# Patient Record
Sex: Female | Born: 1955 | Race: White | Hispanic: Yes | Smoking: Current every day smoker
Health system: Northeastern US, Community
[De-identification: ages and names within clinical notes are randomized; demographics above are authoritative.]

## PROBLEM LIST (undated history)

## (undated) DIAGNOSIS — K219 Gastro-esophageal reflux disease without esophagitis: Secondary | ICD-10-CM

## (undated) DIAGNOSIS — M19079 Primary osteoarthritis, unspecified ankle and foot: Secondary | ICD-10-CM

## (undated) DIAGNOSIS — K644 Residual hemorrhoidal skin tags: Secondary | ICD-10-CM

## (undated) DIAGNOSIS — H6120 Impacted cerumen, unspecified ear: Secondary | ICD-10-CM

## (undated) DIAGNOSIS — E119 Type 2 diabetes mellitus without complications: Secondary | ICD-10-CM

## (undated) DIAGNOSIS — J441 Chronic obstructive pulmonary disease with (acute) exacerbation: Secondary | ICD-10-CM

## (undated) DIAGNOSIS — F32A Depression, unspecified: Secondary | ICD-10-CM

## (undated) DIAGNOSIS — I10 Essential (primary) hypertension: Secondary | ICD-10-CM

## (undated) DIAGNOSIS — M1990 Unspecified osteoarthritis, unspecified site: Secondary | ICD-10-CM

## (undated) DIAGNOSIS — R51 Headache: Secondary | ICD-10-CM

## (undated) DIAGNOSIS — H6121 Impacted cerumen, right ear: Secondary | ICD-10-CM

## (undated) DIAGNOSIS — M25561 Pain in right knee: Secondary | ICD-10-CM

## (undated) DIAGNOSIS — R1013 Epigastric pain: Secondary | ICD-10-CM

## (undated) DIAGNOSIS — M549 Dorsalgia, unspecified: Secondary | ICD-10-CM

## (undated) DIAGNOSIS — J449 Chronic obstructive pulmonary disease, unspecified: Secondary | ICD-10-CM

## (undated) DIAGNOSIS — N764 Abscess of vulva: Secondary | ICD-10-CM

## (undated) DIAGNOSIS — M8580 Other specified disorders of bone density and structure, unspecified site: Secondary | ICD-10-CM

## (undated) DIAGNOSIS — R05 Cough: Secondary | ICD-10-CM

## (undated) DIAGNOSIS — R0902 Hypoxemia: Secondary | ICD-10-CM

## (undated) DIAGNOSIS — G4730 Sleep apnea, unspecified: Secondary | ICD-10-CM

## (undated) DIAGNOSIS — J069 Acute upper respiratory infection, unspecified: Secondary | ICD-10-CM

## (undated) DIAGNOSIS — K869 Disease of pancreas, unspecified: Secondary | ICD-10-CM

## (undated) DIAGNOSIS — M25562 Pain in left knee: Secondary | ICD-10-CM

## (undated) DIAGNOSIS — F329 Major depressive disorder, single episode, unspecified: Secondary | ICD-10-CM

## (undated) DIAGNOSIS — K2970 Gastritis, unspecified, without bleeding: Secondary | ICD-10-CM

## (undated) DIAGNOSIS — R079 Chest pain, unspecified: Secondary | ICD-10-CM

## (undated) DIAGNOSIS — J019 Acute sinusitis, unspecified: Secondary | ICD-10-CM

## (undated) DIAGNOSIS — B9689 Other specified bacterial agents as the cause of diseases classified elsewhere: Secondary | ICD-10-CM

## (undated) DIAGNOSIS — H9202 Otalgia, left ear: Secondary | ICD-10-CM

## (undated) DIAGNOSIS — Z973 Presence of spectacles and contact lenses: Secondary | ICD-10-CM

## (undated) HISTORY — DX: Impacted cerumen, right ear: H61.21

## (undated) HISTORY — PX: CATARACT EXTRACTION EXTRACAPSULAR W/ INTRAOCULAR LENS IMPLANTATION: SHX1305

## (undated) HISTORY — DX: Otalgia, left ear: H92.02

## (undated) HISTORY — PX: OB ANTEPARTUM CARE CESAREAN DLVR & POSTPARTUM: REP299

## (undated) HISTORY — DX: Hypoxemia: R09.02

## (undated) HISTORY — DX: Other specified disorders of bone density and structure, unspecified site: M85.80

## (undated) HISTORY — DX: Chest pain, unspecified: R07.9

## (undated) HISTORY — DX: Other specified bacterial agents as the cause of diseases classified elsewhere: B96.89

## (undated) HISTORY — PX: PR ANES NERVE MUSC TENDON FASCIA&BURSA KNEE&/POPLT: 01320

## (undated) HISTORY — DX: Headache: R51

## (undated) HISTORY — DX: Gastritis, unspecified, without bleeding: K29.70

## (undated) HISTORY — DX: Impacted cerumen, unspecified ear: H61.20

## (undated) HISTORY — DX: Cough: R05

## (undated) HISTORY — DX: Major depressive disorder, single episode, unspecified: F32.9

## (undated) HISTORY — DX: Pain in left knee: M25.561

## (undated) HISTORY — PX: CHOLECYSTECTOMY: GID484

## (undated) HISTORY — DX: Chronic obstructive pulmonary disease, unspecified: J44.9

## (undated) HISTORY — DX: Residual hemorrhoidal skin tags: K64.4

## (undated) HISTORY — PX: FOOT SURGERY: SHX648

## (undated) HISTORY — DX: Depression, unspecified: F32.A

## (undated) HISTORY — DX: Acute sinusitis, unspecified: J01.90

## (undated) HISTORY — DX: Acute upper respiratory infection, unspecified: J06.9

## (undated) HISTORY — DX: Pain in left knee: M25.562

## (undated) HISTORY — DX: Disease of pancreas, unspecified: K86.9

## (undated) HISTORY — DX: Epigastric pain: R10.13

## (undated) HISTORY — DX: Type 2 diabetes mellitus without complications: E11.9

## (undated) HISTORY — DX: Morbid (severe) obesity due to excess calories: E66.01

## (undated) HISTORY — DX: Abscess of vulva: N76.4

## (undated) HISTORY — DX: Gastro-esophageal reflux disease without esophagitis: K21.9

## (undated) HISTORY — PX: LAPAROSCOPY SURG CHOLECYSTECTOMY: GID642

## (undated) HISTORY — PX: TONSILLECTOMY & ADENOIDECTOMY <AGE 12: ENT168

## (undated) HISTORY — DX: Primary osteoarthritis, unspecified ankle and foot: M19.079

---
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
## 1998-11-30 ENCOUNTER — Ambulatory Visit (HOSPITAL_BASED_OUTPATIENT_CLINIC_OR_DEPARTMENT_OTHER): Payer: Self-pay | Admitting: Podiatrist

## 2000-06-16 ENCOUNTER — Emergency Department (HOSPITAL_BASED_OUTPATIENT_CLINIC_OR_DEPARTMENT_OTHER): Payer: Self-pay | Admitting: Emergency Medicine

## 2000-09-03 ENCOUNTER — Emergency Department (HOSPITAL_BASED_OUTPATIENT_CLINIC_OR_DEPARTMENT_OTHER): Payer: Self-pay | Admitting: Emergency Medicine

## 2000-09-12 ENCOUNTER — Ambulatory Visit (HOSPITAL_BASED_OUTPATIENT_CLINIC_OR_DEPARTMENT_OTHER): Payer: Self-pay | Admitting: Orthopaedic Surgery

## 2000-09-25 ENCOUNTER — Other Ambulatory Visit (HOSPITAL_BASED_OUTPATIENT_CLINIC_OR_DEPARTMENT_OTHER): Payer: Self-pay | Admitting: Orthopaedic Surgery

## 2000-10-04 ENCOUNTER — Ambulatory Visit (HOSPITAL_BASED_OUTPATIENT_CLINIC_OR_DEPARTMENT_OTHER): Payer: Self-pay | Admitting: Orthopaedic Surgery

## 2000-11-03 ENCOUNTER — Emergency Department (HOSPITAL_BASED_OUTPATIENT_CLINIC_OR_DEPARTMENT_OTHER): Payer: Self-pay | Admitting: Emergency Medicine

## 2000-11-20 ENCOUNTER — Ambulatory Visit (HOSPITAL_BASED_OUTPATIENT_CLINIC_OR_DEPARTMENT_OTHER): Payer: Self-pay | Admitting: Orthopaedic Surgery

## 2000-11-29 ENCOUNTER — Encounter (HOSPITAL_BASED_OUTPATIENT_CLINIC_OR_DEPARTMENT_OTHER): Payer: Self-pay | Admitting: Orthopaedic Surgery

## 2000-11-29 DIAGNOSIS — M23329 Other meniscus derangements, posterior horn of medial meniscus, unspecified knee: Secondary | ICD-10-CM

## 2000-11-29 LAB — ~~LOC~~-OPERATIVE REPORT

## 2000-12-11 ENCOUNTER — Ambulatory Visit (HOSPITAL_BASED_OUTPATIENT_CLINIC_OR_DEPARTMENT_OTHER): Payer: Self-pay | Admitting: Orthopaedic Surgery

## 2000-12-17 ENCOUNTER — Ambulatory Visit: Payer: Self-pay | Admitting: Orthopaedic Surgery

## 2001-01-14 ENCOUNTER — Ambulatory Visit: Payer: Self-pay | Admitting: Orthopaedic Surgery

## 2001-01-16 ENCOUNTER — Ambulatory Visit (HOSPITAL_BASED_OUTPATIENT_CLINIC_OR_DEPARTMENT_OTHER): Payer: Self-pay | Admitting: Orthopaedic Surgery

## 2001-02-04 ENCOUNTER — Ambulatory Visit: Payer: Self-pay | Admitting: Orthopaedic Surgery

## 2001-02-13 ENCOUNTER — Ambulatory Visit (HOSPITAL_BASED_OUTPATIENT_CLINIC_OR_DEPARTMENT_OTHER): Payer: Self-pay | Admitting: Orthopaedic Surgery

## 2001-02-22 ENCOUNTER — Emergency Department (HOSPITAL_BASED_OUTPATIENT_CLINIC_OR_DEPARTMENT_OTHER): Payer: Self-pay | Admitting: Neurology

## 2001-02-22 DIAGNOSIS — R071 Chest pain on breathing: Secondary | ICD-10-CM

## 2001-02-22 LAB — XR CHEST PORTABLE

## 2001-03-13 ENCOUNTER — Ambulatory Visit (HOSPITAL_BASED_OUTPATIENT_CLINIC_OR_DEPARTMENT_OTHER): Payer: Self-pay | Admitting: Orthopaedic Surgery

## 2001-04-16 ENCOUNTER — Ambulatory Visit (HOSPITAL_BASED_OUTPATIENT_CLINIC_OR_DEPARTMENT_OTHER): Payer: Self-pay | Admitting: Orthopaedic Surgery

## 2001-05-10 ENCOUNTER — Emergency Department (HOSPITAL_BASED_OUTPATIENT_CLINIC_OR_DEPARTMENT_OTHER): Payer: Self-pay | Admitting: Emergency Medicine

## 2001-05-17 ENCOUNTER — Emergency Department (HOSPITAL_BASED_OUTPATIENT_CLINIC_OR_DEPARTMENT_OTHER): Payer: Self-pay | Admitting: Emergency Medicine

## 2001-05-19 LAB — KNEE, LEFT 4 VIEWS

## 2001-06-05 ENCOUNTER — Ambulatory Visit (HOSPITAL_BASED_OUTPATIENT_CLINIC_OR_DEPARTMENT_OTHER): Payer: Self-pay | Admitting: Orthopaedic Surgery

## 2001-06-30 ENCOUNTER — Emergency Department (HOSPITAL_BASED_OUTPATIENT_CLINIC_OR_DEPARTMENT_OTHER): Payer: Self-pay | Admitting: Emergency Medicine

## 2001-06-30 DIAGNOSIS — Z9889 Other specified postprocedural states: Secondary | ICD-10-CM

## 2001-06-30 DIAGNOSIS — M79609 Pain in unspecified limb: Secondary | ICD-10-CM

## 2001-08-27 ENCOUNTER — Ambulatory Visit (HOSPITAL_BASED_OUTPATIENT_CLINIC_OR_DEPARTMENT_OTHER): Payer: Self-pay | Admitting: Orthopaedic Surgery

## 2001-09-26 ENCOUNTER — Emergency Department (HOSPITAL_BASED_OUTPATIENT_CLINIC_OR_DEPARTMENT_OTHER): Payer: Self-pay | Admitting: Internal Medicine

## 2001-09-26 DIAGNOSIS — R079 Chest pain, unspecified: Secondary | ICD-10-CM

## 2001-09-29 LAB — XR CHEST PORTABLE

## 2001-10-09 ENCOUNTER — Emergency Department: Payer: Self-pay | Admitting: Emergency Medicine

## 2001-10-09 DIAGNOSIS — M545 Low back pain, unspecified: Secondary | ICD-10-CM

## 2001-10-09 DIAGNOSIS — W19XXXA Unspecified fall, initial encounter: Secondary | ICD-10-CM

## 2001-11-27 ENCOUNTER — Ambulatory Visit (HOSPITAL_BASED_OUTPATIENT_CLINIC_OR_DEPARTMENT_OTHER): Payer: Self-pay | Admitting: Orthopaedic Surgery

## 2001-12-12 ENCOUNTER — Encounter (HOSPITAL_BASED_OUTPATIENT_CLINIC_OR_DEPARTMENT_OTHER): Payer: Self-pay | Admitting: Orthopaedic Surgery

## 2001-12-12 DIAGNOSIS — IMO0002 Reserved for concepts with insufficient information to code with codable children: Secondary | ICD-10-CM

## 2001-12-12 DIAGNOSIS — S83289A Other tear of lateral meniscus, current injury, unspecified knee, initial encounter: Secondary | ICD-10-CM

## 2001-12-12 LAB — ~~LOC~~-OPERATIVE REPORT

## 2001-12-19 ENCOUNTER — Ambulatory Visit (HOSPITAL_BASED_OUTPATIENT_CLINIC_OR_DEPARTMENT_OTHER): Payer: Self-pay | Admitting: Orthopaedic Surgery

## 2002-01-08 ENCOUNTER — Ambulatory Visit (HOSPITAL_BASED_OUTPATIENT_CLINIC_OR_DEPARTMENT_OTHER): Payer: Self-pay | Admitting: Orthopaedic Surgery

## 2002-01-12 ENCOUNTER — Emergency Department (HOSPITAL_BASED_OUTPATIENT_CLINIC_OR_DEPARTMENT_OTHER): Payer: Self-pay | Admitting: Internal Medicine

## 2002-02-04 ENCOUNTER — Ambulatory Visit (HOSPITAL_BASED_OUTPATIENT_CLINIC_OR_DEPARTMENT_OTHER): Payer: Self-pay | Admitting: Orthopaedic Surgery

## 2002-03-03 ENCOUNTER — Ambulatory Visit (HOSPITAL_BASED_OUTPATIENT_CLINIC_OR_DEPARTMENT_OTHER): Payer: Self-pay | Admitting: Orthopaedic Surgery

## 2002-04-22 ENCOUNTER — Ambulatory Visit (HOSPITAL_BASED_OUTPATIENT_CLINIC_OR_DEPARTMENT_OTHER): Payer: Self-pay | Admitting: Orthopaedic Surgery

## 2002-05-18 ENCOUNTER — Emergency Department (HOSPITAL_BASED_OUTPATIENT_CLINIC_OR_DEPARTMENT_OTHER): Payer: Self-pay | Admitting: Emergency Medicine

## 2002-05-18 DIAGNOSIS — G243 Spasmodic torticollis: Secondary | ICD-10-CM

## 2002-05-19 LAB — XR CERVICAL SPINE WITH OBLIQUES 5 VIEWS

## 2002-06-03 ENCOUNTER — Encounter (HOSPITAL_BASED_OUTPATIENT_CLINIC_OR_DEPARTMENT_OTHER): Payer: Self-pay | Admitting: Orthopaedic Surgery

## 2002-06-24 ENCOUNTER — Encounter (HOSPITAL_BASED_OUTPATIENT_CLINIC_OR_DEPARTMENT_OTHER): Payer: Self-pay | Admitting: Orthopaedic Surgery

## 2002-07-16 ENCOUNTER — Ambulatory Visit (HOSPITAL_BASED_OUTPATIENT_CLINIC_OR_DEPARTMENT_OTHER): Payer: Self-pay | Admitting: Orthopaedic Surgery

## 2002-07-28 ENCOUNTER — Emergency Department (HOSPITAL_BASED_OUTPATIENT_CLINIC_OR_DEPARTMENT_OTHER): Payer: Self-pay | Admitting: Emergency Medicine

## 2002-10-22 ENCOUNTER — Ambulatory Visit (HOSPITAL_BASED_OUTPATIENT_CLINIC_OR_DEPARTMENT_OTHER): Payer: Self-pay | Admitting: Orthopaedic Surgery

## 2002-10-23 LAB — XR KNEE RIGHT 3 VIEWS

## 2002-10-23 LAB — XR KNEE LEFT 3 VIEWS

## 2002-11-16 ENCOUNTER — Emergency Department (HOSPITAL_BASED_OUTPATIENT_CLINIC_OR_DEPARTMENT_OTHER): Payer: Self-pay | Admitting: Emergency Medicine

## 2002-12-10 ENCOUNTER — Emergency Department: Payer: Self-pay | Admitting: Internal Medicine

## 2002-12-26 LAB — TRANSFERASE ALANINE AMINO ALT SGPT
ALANINE AMINOTRANSFERASE: 20 U/L (ref 0–31)
ALANINE AMINOTRANSFERASE: 17 U/L (ref 0–31)

## 2002-12-26 LAB — COMPLETE BLOOD COUNT
HEMATOCRIT: 37.8 % (ref 37.0–47.0)
HEMOGLOBIN: 12.7 g/dL (ref 12.0–16.0)
MEAN CORP HGB CONC: 33.6 g/dL (ref 32.0–36.0)
MEAN CORPUSCULAR HGB: 28.7 pg (ref 27.0–31.0)
MEAN CORPUSCULAR VOL: 85.6 fL (ref 81.0–99.0)
RBC DISTRIBUTION WIDTH: 13.8 % (ref 11.5–14.3)
RED BLOOD CELL COUNT: 4.42 MIL/uL (ref 4.20–5.40)
WHITE BLOOD CELL COUNT: 9.7 10*3/uL (ref 4.8–10.8)

## 2002-12-26 LAB — PROTHROMBIN TIME
INR: 1 (ref 0.83–1.23)
INR: 1 (ref 0.83–1.23)
PROTHROMBIN TIME: 12.4 SECONDS (ref 10.5–13.0)
PROTHROMBIN TIME: 12.5 SECONDS (ref 10.5–13.0)

## 2002-12-26 LAB — CHOLESTEROL SERUM/WHOLE BLOOD TOTAL
Cholesterol: 197 mg/dl (ref ?–200)
Cholesterol: 183 mg/dl (ref ?–200)

## 2002-12-26 LAB — BASIC METABOLIC PANEL
BUN (UREA NITROGEN): 10 mg/dl (ref 6–20)
BUN (UREA NITROGEN): 10 mg/dl (ref 6–20)
CALCIUM: 8.9 mg/dl (ref 8.4–10.2)
CALCIUM: 9.3 mg/dl (ref 8.4–10.2)
CARBON DIOXIDE: 22 mEQ/L (ref 22–31)
CARBON DIOXIDE: 23 mEQ/L (ref 22–31)
CHLORIDE: 104 mEQ/L (ref 96–106)
CHLORIDE: 101 mEQ/L (ref 96–106)
CREATININE: 0.5 mg/dl (ref 0.4–1.2)
CREATININE: 0.5 mg/dl (ref 0.4–1.2)
Glucose Random: 128 mg/dl — ABNORMAL HIGH (ref 70–110)
Glucose Random: 93 mg/dl (ref 70–110)
POTASSIUM: 4 mEQ/L (ref 3.4–5.0)
POTASSIUM: 4 mEQ/L (ref 3.4–5.0)
SODIUM: 139 mEQ/L (ref 134–145)
SODIUM: 140 mEQ/L (ref 134–145)

## 2002-12-26 LAB — TROPONIN T, WHIDDEN HOSPITAL
TROPONIN T: <0.01 ng/ml (ref 0.00–0.04)
TROPONIN T: <0.01 ng/ml (ref 0.00–0.04)

## 2002-12-26 LAB — BLOOD COUNT COMPLETE AUTO&AUTO DIFRNTL WBC
BASOPHIL %: 0.8 % (ref 0–2)
BASOPHIL %: 0.8 % (ref 0–2)
EOSINOPHIL %: 3.4 % (ref 0–5)
EOSINOPHIL %: 2.1 % (ref 0–5)
HEMATOCRIT: 38 % (ref 34.9–46.9)
HEMATOCRIT: 40.1 % (ref 34.9–46.9)
HEMOGLOBIN: 12.9 g/dL (ref 11.7–15.7)
HEMOGLOBIN: 13.2 g/dL (ref 11.7–15.7)
LYMPHOCYTE %: 27.7 % (ref 20–45)
LYMPHOCYTE %: 26.1 % (ref 20–45)
MEAN CORP HGB CONC: 34 g/dL (ref 31.4–35.8)
MEAN CORP HGB CONC: 32.9 g/dL (ref 31.4–35.8)
MEAN CORPUSCULAR HGB: 30.7 pg (ref 26.4–34.0)
MEAN CORPUSCULAR HGB: 28.2 pg (ref 26.4–34.0)
MEAN CORPUSCULAR VOL: 90.4 fl (ref 80.5–99.7)
MEAN CORPUSCULAR VOL: 85.7 fl (ref 80.5–99.7)
MEAN PLATELET VOLUME: 10.8 fL (ref 6.8–12.8)
MEAN PLATELET VOLUME: 9.8 fL (ref 9.0–11.8)
MONOCYTE %: 5.5 % (ref 0–12)
MONOCYTE %: 6.9 % (ref 0–12)
NEUTROPHIL %: 62.7 % (ref 40–70)
NEUTROPHIL %: 64.3 % (ref 40–70)
PLATELET COUNT: 348 10*3/uL (ref 130–400)
PLATELET COUNT: 337 10*3/uL (ref 130–400)
RBC DISTRIBUTION WIDTH: 12.9 % (ref 11.0–15.0)
RBC DISTRIBUTION WIDTH: 12.9 % (ref 11.0–15.0)
RED BLOOD CELL COUNT: 4.2 M/uL (ref 3.80–5.20)
RED BLOOD CELL COUNT: 4.68 M/uL (ref 3.80–5.20)
WHITE BLOOD CELL COUNT: 10.8 10*3/uL (ref 4.5–11.0)
WHITE BLOOD CELL COUNT: 12.2 10*3/uL — ABNORMAL HIGH (ref 4.5–11.0)

## 2002-12-26 LAB — CK-MB PROFILE
CK INDEX: 1.2 % (ref 0–4)
CK INDEX: 1.8 % (ref 0–4)
CKMB: 1.3 ng/ml (ref 0.0–5.0)
CKMB: 1.7 ng/ml (ref 0.0–5.0)
CREATINE KINASE TOTAL: 105 IU/L (ref 24–170)
CREATINE KINASE TOTAL: 95 IU/L (ref 24–170)

## 2002-12-26 LAB — LACTATE DEHYDROGENASE
LACTATE DEHYDROGENASE: 168 U/L (ref 94–210)
LACTATE DEHYDROGENASE: 146 U/L (ref 94–210)

## 2002-12-26 LAB — TRANSFERASE ASPARTATE AMINO AST SGOT
ASPARTATE AMINOTRANSFERASE: 19 U/L (ref 0–31)
ASPARTATE AMINOTRANSFERASE: 13 U/L (ref 0–31)

## 2002-12-26 LAB — APTT
APTT: 26.1 SECONDS (ref 20.0–35.0)
APTT: 24.9 SECONDS (ref 20.0–35.0)

## 2003-01-21 ENCOUNTER — Ambulatory Visit (HOSPITAL_BASED_OUTPATIENT_CLINIC_OR_DEPARTMENT_OTHER): Payer: Self-pay | Admitting: Orthopaedic Surgery

## 2003-02-26 ENCOUNTER — Emergency Department (HOSPITAL_BASED_OUTPATIENT_CLINIC_OR_DEPARTMENT_OTHER): Payer: Self-pay | Admitting: Emergency Medicine

## 2003-02-27 LAB — SHOULDER, LEFT 3 VIEWS

## 2003-03-04 ENCOUNTER — Encounter (HOSPITAL_BASED_OUTPATIENT_CLINIC_OR_DEPARTMENT_OTHER): Payer: Self-pay | Admitting: Orthopaedic Surgery

## 2003-03-12 ENCOUNTER — Encounter (HOSPITAL_BASED_OUTPATIENT_CLINIC_OR_DEPARTMENT_OTHER): Payer: Self-pay

## 2003-03-19 ENCOUNTER — Encounter (HOSPITAL_BASED_OUTPATIENT_CLINIC_OR_DEPARTMENT_OTHER): Payer: Self-pay

## 2003-04-09 ENCOUNTER — Ambulatory Visit (HOSPITAL_BASED_OUTPATIENT_CLINIC_OR_DEPARTMENT_OTHER): Payer: Self-pay | Admitting: Orthopaedic Surgery

## 2003-06-07 ENCOUNTER — Emergency Department (HOSPITAL_BASED_OUTPATIENT_CLINIC_OR_DEPARTMENT_OTHER): Payer: Self-pay | Admitting: Emergency Medicine

## 2003-08-13 ENCOUNTER — Emergency Department (HOSPITAL_BASED_OUTPATIENT_CLINIC_OR_DEPARTMENT_OTHER): Payer: Self-pay | Admitting: Emergency Medicine

## 2003-08-16 LAB — LUMBAR SPINE, AP, LAT & SPOT

## 2003-08-21 ENCOUNTER — Emergency Department (HOSPITAL_BASED_OUTPATIENT_CLINIC_OR_DEPARTMENT_OTHER): Payer: Self-pay | Admitting: Emergency Medicine

## 2003-08-21 LAB — URINALYSIS
BACTERIA: <10 PER HPF — AB (ref 0–?)
BILIRUBIN, URINE: NEGATIVE
CASTS: NONE SEEN PER LPF
CRYSTALS: NONE SEEN
GLUCOSE, URINE: NEGATIVE MG/DL
LEUKOCYTE ESTERASE: NEGATIVE
NITRITE, URINE: NEGATIVE
PH URINE: 5 (ref 5.0–8.0)
PROTEIN, URINE: 100 MG/DL — AB
SPECIFIC GRAVITY URINE: >=1.03 (ref 1.003–1.035)

## 2003-08-21 LAB — CHG HEPATIC FUNCTION PANEL
ALANINE AMINOTRANSFERASE: 19 U/L (ref 0–31)
ALBUMIN: 4.3 g/dl (ref 3.2–4.9)
ALKALINE PHOSPHATASE: 88 U/L (ref 30–139)
ASPARTATE AMINOTRANSFERASE: 19 U/L (ref 0–31)
BILIRUBIN DIRECT: <0.1 mg/dl (ref 0–0.3)
BILIRUBIN TOTAL: 0.1 mg/dl — ABNORMAL LOW (ref 0.2–1.2)
TOTAL PROTEIN: 7.2 g/dl (ref 6.5–8.4)

## 2003-08-21 LAB — BLOOD COUNT COMPLETE AUTO&AUTO DIFRNTL WBC
BASOPHIL %: 0.3 % (ref 0–2)
EOSINOPHIL %: 0.2 % (ref 0–5)
HEMATOCRIT: 36.7 % (ref 34.9–46.9)
HEMOGLOBIN: 11.8 g/dL (ref 11.7–15.7)
LYMPHOCYTE %: 8.4 % — ABNORMAL LOW (ref 20–45)
MEAN CORP HGB CONC: 32.2 g/dL (ref 31.4–35.8)
MEAN CORPUSCULAR HGB: 27.2 pg (ref 26.4–34.0)
MEAN CORPUSCULAR VOL: 84.4 fl (ref 80.5–99.7)
MEAN PLATELET VOLUME: 10.1 fL (ref 6.8–12.8)
MONOCYTE %: 3.5 % (ref 0–12)
NEUTROPHIL %: 87.6 % — ABNORMAL HIGH (ref 40–70)
PLATELET COUNT: 350 10*3/uL (ref 130–400)
RBC DISTRIBUTION WIDTH: 15 % (ref 11.0–15.0)
RED BLOOD CELL COUNT: 4.34 M/uL (ref 3.80–5.20)
WHITE BLOOD CELL COUNT: 14.5 10*3/uL — ABNORMAL HIGH (ref 4.5–11.0)

## 2003-08-21 LAB — BASIC METABOLIC PANEL
BUN (UREA NITROGEN): 14 mg/dl (ref 6–20)
CALCIUM: 9.1 mg/dl (ref 8.4–10.2)
CARBON DIOXIDE: 22 mEQ/L (ref 22–31)
CHLORIDE: 105 mEQ/L (ref 96–106)
CREATININE: 0.6 mg/dl (ref 0.4–1.2)
Glucose Random: 119 mg/dl — ABNORMAL HIGH (ref 70–110)
POTASSIUM: 3.7 mEQ/L (ref 3.4–5.0)
SODIUM: 140 mEQ/L (ref 134–145)

## 2003-08-21 LAB — AMYLASE: AMYLASE: 44 U/L (ref 18–110)

## 2003-08-21 LAB — HCG QUALITATIVE SERUM: HCG QUALITATIVE SERUM: NEGATIVE

## 2003-08-23 LAB — URINE CULTURE/COLONY COUNT

## 2003-08-24 LAB — XR ABDOMEN (KUB) WITH UPRIGHT AND OR DECUBITUS

## 2003-10-24 ENCOUNTER — Emergency Department (HOSPITAL_BASED_OUTPATIENT_CLINIC_OR_DEPARTMENT_OTHER): Payer: Self-pay | Admitting: Emergency Medicine

## 2003-12-10 ENCOUNTER — Encounter (HOSPITAL_BASED_OUTPATIENT_CLINIC_OR_DEPARTMENT_OTHER): Payer: MEDICARE

## 2003-12-10 DIAGNOSIS — IMO0002 Reserved for concepts with insufficient information to code with codable children: Secondary | ICD-10-CM

## 2003-12-10 DIAGNOSIS — M1710 Unilateral primary osteoarthritis, unspecified knee: Principal | ICD-10-CM

## 2003-12-10 DIAGNOSIS — M25569 Pain in unspecified knee: Secondary | ICD-10-CM

## 2003-12-13 LAB — KNEE, LEFT 2 VIEWS

## 2003-12-13 LAB — KNEES, BILAT AP STANDING ONLY

## 2003-12-13 LAB — KNEE, RIGHT 2 VIEWS

## 2004-01-01 ENCOUNTER — Emergency Department (HOSPITAL_BASED_OUTPATIENT_CLINIC_OR_DEPARTMENT_OTHER): Payer: Self-pay | Admitting: Emergency Medicine

## 2004-01-03 LAB — XR KNEE LEFT 3 VIEWS

## 2004-06-05 ENCOUNTER — Ambulatory Visit (HOSPITAL_BASED_OUTPATIENT_CLINIC_OR_DEPARTMENT_OTHER): Payer: Self-pay | Admitting: Orthopaedic Surgery

## 2004-08-10 ENCOUNTER — Ambulatory Visit (HOSPITAL_BASED_OUTPATIENT_CLINIC_OR_DEPARTMENT_OTHER): Payer: MEDICARE | Admitting: Orthopaedic Surgery

## 2004-09-19 ENCOUNTER — Encounter (HOSPITAL_BASED_OUTPATIENT_CLINIC_OR_DEPARTMENT_OTHER): Payer: Self-pay | Admitting: Orthopaedic Surgery

## 2004-09-21 ENCOUNTER — Encounter (HOSPITAL_BASED_OUTPATIENT_CLINIC_OR_DEPARTMENT_OTHER): Payer: MEDICARE | Admitting: Physician Assistant

## 2004-10-19 ENCOUNTER — Encounter (HOSPITAL_BASED_OUTPATIENT_CLINIC_OR_DEPARTMENT_OTHER): Payer: MEDICARE | Admitting: Orthopaedic Surgery

## 2004-11-14 LAB — ~~LOC~~-OPERATIVE REPORT

## 2004-12-08 ENCOUNTER — Emergency Department: Payer: Self-pay | Admitting: Emergency Medicine

## 2004-12-20 ENCOUNTER — Ambulatory Visit (HOSPITAL_BASED_OUTPATIENT_CLINIC_OR_DEPARTMENT_OTHER): Payer: MEDICARE | Admitting: Physician Assistant

## 2005-01-03 ENCOUNTER — Ambulatory Visit (HOSPITAL_BASED_OUTPATIENT_CLINIC_OR_DEPARTMENT_OTHER): Payer: MEDICARE | Admitting: Physician Assistant

## 2005-01-05 LAB — ORTHOPEDIC OFFICE NOTE

## 2005-01-09 ENCOUNTER — Emergency Department: Payer: Self-pay | Admitting: Emergency Medicine

## 2005-02-22 ENCOUNTER — Ambulatory Visit (HOSPITAL_BASED_OUTPATIENT_CLINIC_OR_DEPARTMENT_OTHER): Payer: MEDICARE | Admitting: Orthopaedic Surgery

## 2005-02-22 DIAGNOSIS — M79609 Pain in unspecified limb: Secondary | ICD-10-CM

## 2005-02-22 DIAGNOSIS — M7120 Synovial cyst of popliteal space [Baker], unspecified knee: Secondary | ICD-10-CM

## 2005-02-22 DIAGNOSIS — M6580 Other synovitis and tenosynovitis, unspecified site: Secondary | ICD-10-CM

## 2005-02-22 LAB — VENOUS DUPLEX DOPPLER

## 2005-05-10 ENCOUNTER — Ambulatory Visit (HOSPITAL_BASED_OUTPATIENT_CLINIC_OR_DEPARTMENT_OTHER): Payer: MEDICARE | Admitting: Orthopaedic Surgery

## 2005-06-08 ENCOUNTER — Emergency Department (HOSPITAL_BASED_OUTPATIENT_CLINIC_OR_DEPARTMENT_OTHER): Payer: Self-pay | Admitting: Emergency Medicine

## 2005-10-31 ENCOUNTER — Ambulatory Visit (HOSPITAL_BASED_OUTPATIENT_CLINIC_OR_DEPARTMENT_OTHER): Payer: MEDICARE

## 2005-11-07 ENCOUNTER — Ambulatory Visit (HOSPITAL_BASED_OUTPATIENT_CLINIC_OR_DEPARTMENT_OTHER): Payer: MEDICARE

## 2005-11-07 DIAGNOSIS — M2408 Loose body, other site: Secondary | ICD-10-CM

## 2005-11-07 LAB — KNEE, LEFT 4 VIEWS

## 2005-11-07 LAB — XR KNEES STANDING AP ONLY BILATERAL

## 2005-11-16 LAB — ORTHOPEDIC OFFICE NOTE

## 2005-11-22 ENCOUNTER — Other Ambulatory Visit: Payer: Self-pay | Admitting: Orthopaedic Surgery

## 2005-11-22 DIAGNOSIS — M25569 Pain in unspecified knee: Secondary | ICD-10-CM

## 2005-11-22 DIAGNOSIS — M1710 Unilateral primary osteoarthritis, unspecified knee: Secondary | ICD-10-CM

## 2005-11-22 LAB — MRI KNEE LEFT WO CONTRAST

## 2005-11-26 ENCOUNTER — Ambulatory Visit (HOSPITAL_BASED_OUTPATIENT_CLINIC_OR_DEPARTMENT_OTHER): Payer: MEDICARE | Admitting: Orthopaedic Surgery

## 2005-11-26 DIAGNOSIS — M25569 Pain in unspecified knee: Secondary | ICD-10-CM

## 2005-12-02 LAB — ORTHOPEDIC OFFICE NOTE

## 2005-12-14 ENCOUNTER — Encounter (HOSPITAL_BASED_OUTPATIENT_CLINIC_OR_DEPARTMENT_OTHER): Payer: Self-pay | Admitting: Orthopaedic Surgery

## 2005-12-18 ENCOUNTER — Ambulatory Visit (HOSPITAL_BASED_OUTPATIENT_CLINIC_OR_DEPARTMENT_OTHER): Payer: MEDICARE

## 2005-12-24 LAB — BLOOD COUNT COMPLETE AUTOMATED
HEMATOCRIT: 32.6 % — ABNORMAL LOW (ref 37.0–47.0)
HEMOGLOBIN: 10.8 g/dL — ABNORMAL LOW (ref 12.0–16.0)
MEAN CORP HGB CONC: 33.2 g/dL (ref 32.0–36.0)
MEAN CORPUSCULAR HGB: 26.6 pg — ABNORMAL LOW (ref 27.0–31.0)
MEAN CORPUSCULAR VOL: 80.1 fL — ABNORMAL LOW (ref 81.0–99.0)
MEAN PLATELET VOLUME: 8.4 fL (ref 6.4–10.8)
PLATELET COUNT: 406 10*3/uL — ABNORMAL HIGH (ref 150–400)
RBC DISTRIBUTION WIDTH: 17 % — ABNORMAL HIGH (ref 11.5–14.3)
RED BLOOD CELL COUNT: 4.07 MIL/uL — ABNORMAL LOW (ref 4.20–5.40)
WHITE BLOOD CELL COUNT: 12.3 10*3/uL — ABNORMAL HIGH (ref 4.8–10.8)

## 2005-12-24 LAB — EKG-12 ** TO BE DONE BY EKG **

## 2005-12-24 LAB — APTT: APTT: 25.5 SECONDS (ref 22.0–32.8)

## 2005-12-24 LAB — BASIC METABOLIC PANEL
BUN (UREA NITROGEN): 10 mg/dl (ref 6–20)
CALCIUM: 9.1 mg/dl (ref 8.6–10.0)
CARBON DIOXIDE: 24 mmol/L (ref 22–32)
CHLORIDE: 106 mmol/L (ref 98–107)
CREATININE: 0.7 mg/dl (ref 0.4–1.2)
Glucose Random: 110 mg/dl (ref 74–160)
POTASSIUM: 4.2 mmol/L (ref 3.5–5.1)
SODIUM: 135 mmol/L (ref 135–144)

## 2005-12-24 LAB — PROTHROMBIN TIME
INR: 0.9 — ABNORMAL LOW (ref 2.0–3.5)
PROTHROMBIN TIME: 11.6 SECONDS (ref 11.5–13.5)

## 2005-12-31 ENCOUNTER — Ambulatory Visit (HOSPITAL_BASED_OUTPATIENT_CLINIC_OR_DEPARTMENT_OTHER): Payer: MEDICARE

## 2005-12-31 DIAGNOSIS — M2240 Chondromalacia patellae, unspecified knee: Secondary | ICD-10-CM

## 2006-01-18 LAB — ORTHOPEDIC OFFICE NOTE

## 2006-01-21 LAB — ~~LOC~~-OPERATIVE REPORT

## 2006-01-29 ENCOUNTER — Ambulatory Visit (HOSPITAL_BASED_OUTPATIENT_CLINIC_OR_DEPARTMENT_OTHER): Payer: MEDICARE

## 2006-06-09 ENCOUNTER — Emergency Department (HOSPITAL_BASED_OUTPATIENT_CLINIC_OR_DEPARTMENT_OTHER): Payer: Self-pay | Admitting: Emergency Medicine

## 2006-07-21 ENCOUNTER — Emergency Department (HOSPITAL_BASED_OUTPATIENT_CLINIC_OR_DEPARTMENT_OTHER): Payer: Self-pay | Admitting: Emergency Medicine

## 2006-07-21 LAB — BASIC METABOLIC PANEL
ANION GAP: 10 mmol/L (ref 2–25)
BUN (UREA NITROGEN): 11 mg/dl (ref 6–20)
CALCIUM: 9.2 mg/dl (ref 8.6–10.0)
CARBON DIOXIDE: 23 mmol/L (ref 22–32)
CHLORIDE: 103 mmol/L (ref 101–111)
CREATININE: 0.7 mg/dl (ref 0.4–1.2)
Glucose Random: 132 mg/dl (ref 74–160)
POTASSIUM: 3.9 mmol/L (ref 3.5–5.1)
SODIUM: 136 mmol/L (ref 135–144)

## 2006-07-21 LAB — BLOOD COUNT COMPLETE AUTO&AUTO DIFRNTL WBC
BASOPHIL %: 2.3 % — ABNORMAL HIGH (ref 0–2)
EOSINOPHIL %: 2.8 % (ref 0–5)
HEMATOCRIT: 37 % (ref 34.9–46.9)
HEMOGLOBIN: 12.4 g/dL (ref 11.7–15.7)
LYMPHOCYTE %: 27.5 % (ref 20–45)
MEAN CORP HGB CONC: 33.5 g/dL (ref 31.4–35.8)
MEAN CORPUSCULAR HGB: 28.4 pg (ref 26.4–34.0)
MEAN CORPUSCULAR VOL: 84.8 fl (ref 80.5–99.7)
MEAN PLATELET VOLUME: 8.4 fL (ref 6.8–12.8)
MONOCYTE %: 6.2 % (ref 0–12)
NEUTROPHIL %: 61.2 % (ref 40–70)
PLATELET COUNT: 310 10*3/uL (ref 130–400)
RBC DISTRIBUTION WIDTH: 19.1 % — ABNORMAL HIGH (ref 11.0–15.0)
RED BLOOD CELL COUNT: 4.37 M/uL (ref 3.80–5.20)
WHITE BLOOD CELL COUNT: 11.2 10*3/uL — ABNORMAL HIGH (ref 4.5–11.0)

## 2006-07-21 LAB — PROTHROMBIN TIME
INR: 0.9 — ABNORMAL LOW (ref 2.0–3.5)
PROTHROMBIN TIME: 11.9 SECONDS (ref 11.5–13.5)

## 2006-07-21 LAB — TROPONIN I
TROPONIN I: 0.01 ng/mL (ref 0.00–0.04)
TROPONIN I: 0.01 ng/mL (ref 0.00–0.04)

## 2006-07-21 LAB — LACTATE DEHYDROGENASE: LACTATE DEHYDROGENASE: 129 IU/L (ref 100–242)

## 2006-07-21 LAB — XR CHEST PORTABLE

## 2006-07-21 LAB — CK-MB PROFILE
CK INDEX: 1.7 %
CKMB: 1.5 ng/mL (ref 0.0–4.0)
CREATINE KINASE TOTAL: 90 IU/L (ref 21–215)

## 2006-07-21 LAB — EKG

## 2006-07-21 LAB — EMERGENCY ROOM NOTE

## 2006-07-21 LAB — CHOLESTEROL SERUM/WHOLE BLOOD TOTAL: Cholesterol: 111 mg/dl (ref 0–200)

## 2006-07-21 LAB — TRANSFERASE ALANINE AMINO ALT SGPT: ALANINE AMINOTRANSFERASE: 20 IU/L (ref 7–35)

## 2006-07-21 LAB — TRANSFERASE ASPARTATE AMINO AST SGOT: ASPARTATE AMINOTRANSFERASE: 19 IU/L (ref 8–34)

## 2006-07-21 LAB — APTT: APTT: 24.8 SECONDS (ref 22.0–32.8)

## 2006-08-08 LAB — EMERGENCY ROOM NOTE

## 2006-09-14 LAB — EMERGENCY ROOM NOTE

## 2006-11-24 ENCOUNTER — Emergency Department (HOSPITAL_BASED_OUTPATIENT_CLINIC_OR_DEPARTMENT_OTHER): Payer: Self-pay | Admitting: Emergency Medicine

## 2006-11-24 LAB — BLOOD COUNT COMPLETE AUTO&AUTO DIFRNTL WBC
BASOPHIL %: 0.5 % (ref 0.0–2.0)
EOSINOPHIL %: 4.4 % (ref 0.0–7.0)
HEMATOCRIT: 35.2 % — ABNORMAL LOW (ref 36.0–48.0)
HEMOGLOBIN: 11.6 g/dl — ABNORMAL LOW (ref 12.0–16.0)
LYMPHOCYTE %: 30.9 % (ref 13.0–39.0)
MEAN CORP HGB CONC: 33 g/dl (ref 32.0–36.0)
MEAN CORPUSCULAR HGB: 26.8 pg — ABNORMAL LOW (ref 27.0–33.0)
MEAN CORPUSCULAR VOL: 81.3 fl (ref 80.0–100.0)
MEAN PLATELET VOLUME: 8.6 fl (ref 6.4–10.8)
MONOCYTE %: 7.7 % (ref 1.0–12.0)
NEUTROPHIL %: 56.5 % (ref 46.0–79.0)
PLATELET COUNT: 386 10*3/uL (ref 150–400)
RBC DISTRIBUTION WIDTH: 16.1 % — ABNORMAL HIGH (ref 11.5–14.3)
RED BLOOD CELL COUNT: 4.33 M/uL — ABNORMAL LOW (ref 4.50–5.10)
WHITE BLOOD CELL COUNT: 11.6 10*3/uL — ABNORMAL HIGH (ref 4.0–10.8)

## 2006-11-24 LAB — BASIC METABOLIC PANEL
ANION GAP: 5 mmol/L (ref 2–25)
BUN (UREA NITROGEN): 11 mg/dl (ref 6–20)
CALCIUM: 9.2 mg/dl (ref 8.6–10.0)
CARBON DIOXIDE: 26 mmol/L (ref 22–32)
CHLORIDE: 108 mmol/L (ref 101–111)
CREATININE: 0.7 mg/dl (ref 0.4–1.2)
Glucose Random: 113 mg/dl (ref 74–160)
POTASSIUM: 4.3 mmol/L (ref 3.5–5.1)
SODIUM: 138 mmol/L (ref 135–144)

## 2006-11-24 LAB — HOLD BLUE TOP TUBE

## 2006-11-24 LAB — EMERGENCY ROOM NOTE

## 2006-11-24 LAB — HOLD RED TOP TUBE

## 2006-11-24 LAB — XR CHEST 2 VIEWS

## 2006-11-28 ENCOUNTER — Emergency Department: Payer: Self-pay | Admitting: Emergency Medicine

## 2006-12-03 LAB — EMERGENCY ROOM NOTE

## 2007-06-02 ENCOUNTER — Emergency Department (HOSPITAL_BASED_OUTPATIENT_CLINIC_OR_DEPARTMENT_OTHER)
Admission: RE | Admit: 2007-06-02 | Disposition: A | Discharge status: Home / Self Care | Payer: Self-pay | Source: Emergency Department | Attending: Emergency Medicine | Admitting: Emergency Medicine

## 2007-06-02 LAB — BLOOD COUNT COMPLETE AUTO&AUTO DIFRNTL WBC
BASOPHIL %: 1.1 % (ref 0.0–2.0)
EOSINOPHIL %: 3.2 % (ref 0.0–7.0)
HEMATOCRIT: 39.8 % (ref 36.0–48.0)
HEMOGLOBIN: 13.2 g/dl (ref 12.0–16.0)
LYMPHOCYTE %: 24.9 % (ref 13.0–39.0)
MEAN CORP HGB CONC: 33.2 g/dl (ref 32.0–36.0)
MEAN CORPUSCULAR HGB: 29.6 pg (ref 27.0–33.0)
MEAN CORPUSCULAR VOL: 89.3 fl (ref 80.0–100.0)
MEAN PLATELET VOLUME: 9 fl (ref 6.4–10.8)
MONOCYTE %: 6.4 % (ref 1.0–12.0)
NEUTROPHIL %: 64.4 % (ref 46.0–79.0)
PLATELET COUNT: 335 10*3/uL (ref 150–400)
RBC DISTRIBUTION WIDTH: 15.1 % — ABNORMAL HIGH (ref 11.5–14.3)
RED BLOOD CELL COUNT: 4.46 M/uL — ABNORMAL LOW (ref 4.50–5.10)
WHITE BLOOD CELL COUNT: 12.6 10*3/uL — ABNORMAL HIGH (ref 4.0–10.8)

## 2007-06-02 LAB — COMPREHENSIVE METABOLIC PANEL
ALANINE AMINOTRANSFERASE: 24 IU/L (ref 7–35)
ALBUMIN: 3.7 g/dl (ref 3.4–4.8)
ALKALINE PHOSPHATASE: 81 IU/L (ref 25–106)
ANION GAP: 8 mmol/L (ref 2–25)
ASPARTATE AMINOTRANSFERASE: 22 IU/L (ref 8–34)
BILIRUBIN TOTAL: 0.7 mg/dl (ref 0.2–1.1)
BUN (UREA NITROGEN): 11 mg/dl (ref 6–20)
CALCIUM: 9.1 mg/dl (ref 8.6–10.0)
CARBON DIOXIDE: 25 mmol/L (ref 22–32)
CHLORIDE: 105 mmol/L (ref 101–111)
CREATININE: 0.5 mg/dl (ref 0.4–1.2)
ESTIMATED GLOMERULAR FILT RATE: >60 mL/min (ref 60–116)
Glucose Random: 128 mg/dl (ref 74–160)
POTASSIUM: 3.8 mmol/L (ref 3.5–5.1)
SODIUM: 138 mmol/L (ref 135–144)
TOTAL PROTEIN: 6.4 g/dl (ref 5.9–7.5)

## 2007-06-02 LAB — PROTHROMBIN TIME
INR: 1 — ABNORMAL LOW (ref 2.0–3.5)
PROTHROMBIN TIME: 10 SECONDS (ref 9.5–11.5)

## 2007-06-02 LAB — APTT: APTT: 28.9 SECONDS (ref 23.7–33.3)

## 2007-06-02 LAB — CK-MB PROFILE
CK INDEX: 2 %
CKMB: 2.2 ng/mL (ref 0.0–4.0)
CREATINE KINASE TOTAL: 111 IU/L (ref 21–215)

## 2007-06-02 LAB — XR CHEST 2 VIEWS

## 2007-06-02 LAB — TROPONIN I: TROPONIN I: 0.02 ng/mL (ref 0.00–0.04)

## 2007-06-02 LAB — EKG

## 2007-06-05 ENCOUNTER — Emergency Department (HOSPITAL_BASED_OUTPATIENT_CLINIC_OR_DEPARTMENT_OTHER)
Admission: RE | Admit: 2007-06-05 | Disposition: A | Discharge status: Home / Self Care | Payer: Self-pay | Source: Emergency Department | Admitting: Emergency Medicine

## 2007-06-10 ENCOUNTER — Emergency Department (HOSPITAL_BASED_OUTPATIENT_CLINIC_OR_DEPARTMENT_OTHER)
Admission: RE | Admit: 2007-06-10 | Disposition: A | Discharge status: Home / Self Care | Payer: Self-pay | Source: Emergency Department | Admitting: Emergency Medicine

## 2007-06-10 LAB — EMERGENCY ROOM NOTE

## 2007-06-11 LAB — URINALYSIS
BILIRUBIN, URINE: NEGATIVE
CASTS: NONE SEEN PER LPF
CRYSTALS: NONE SEEN
GLUCOSE, URINE: NEGATIVE MG/DL
KETONE, URINE: NEGATIVE MG/DL
LEUKOCYTE ESTERASE: NEGATIVE
NITRITE, URINE: NEGATIVE
PH URINE: 6.5 (ref 5.0–8.0)
PROTEIN, URINE: 30 MG/DL — AB
SPECIFIC GRAVITY URINE: 1.008 (ref 1.003–1.035)
SQUAMOUS EPITHELIAL CELLS: >10 PER LPF — AB (ref 0–2)

## 2007-06-11 LAB — BLOOD COUNT COMPLETE AUTO&AUTO DIFRNTL WBC
BASOPHIL %: 0.5 % (ref 0.0–2.0)
EOSINOPHIL %: 3.1 % (ref 0.0–7.0)
HEMATOCRIT: 39.1 % (ref 36.0–48.0)
HEMOGLOBIN: 13.1 g/dl (ref 12.0–16.0)
LYMPHOCYTE %: 24 % (ref 13.0–39.0)
MEAN CORP HGB CONC: 33.4 g/dl (ref 32.0–36.0)
MEAN CORPUSCULAR HGB: 29.7 pg (ref 27.0–33.0)
MEAN CORPUSCULAR VOL: 88.8 fl (ref 80.0–100.0)
MEAN PLATELET VOLUME: 7.9 fl (ref 6.4–10.8)
MONOCYTE %: 6.4 % (ref 1.0–12.0)
NEUTROPHIL %: 66 % (ref 46.0–79.0)
PLATELET COUNT: 316 10*3/uL (ref 150–400)
RBC DISTRIBUTION WIDTH: 14.9 % — ABNORMAL HIGH (ref 11.5–14.3)
RED BLOOD CELL COUNT: 4.41 M/uL — ABNORMAL LOW (ref 4.50–5.10)
WHITE BLOOD CELL COUNT: 11.8 10*3/uL — ABNORMAL HIGH (ref 4.0–10.8)

## 2007-06-11 LAB — COMPREHENSIVE METABOLIC PANEL
BUN (UREA NITROGEN): 10 mg/dl (ref 6–20)
CALCIUM: 8.9 mg/dl (ref 8.6–10.0)
CARBON DIOXIDE: 27 mmol/L (ref 22–32)
CHLORIDE: 108 mmol/L (ref 101–111)
CREATININE: 0.6 mg/dl (ref 0.4–1.2)
ESTIMATED GLOMERULAR FILT RATE: >60 mL/min (ref 60–116)
Glucose Random: 104 mg/dl (ref 74–160)
POTASSIUM: 4 mmol/L (ref 3.5–5.1)
SODIUM: 136 mmol/L (ref 135–144)
TOTAL PROTEIN: 6.6 g/dl (ref 5.9–7.5)

## 2007-06-11 LAB — LIPASE: LIPASE: 35 U/L (ref 10–50)

## 2007-06-11 LAB — EMERGENCY ROOM NOTE

## 2007-06-11 LAB — XR ABDOMEN (KUB) WITH UPRIGHT AND OR DECUBITUS

## 2007-06-11 LAB — AMYLASE: AMYLASE: 52 U/L (ref 15–133)

## 2007-06-11 LAB — EKG-12 ** TO BE DONE BY EKG **

## 2007-06-16 ENCOUNTER — Inpatient Hospital Stay (HOSPITAL_BASED_OUTPATIENT_CLINIC_OR_DEPARTMENT_OTHER)
Admission: RE | Admit: 2007-06-16 | Disposition: A | Discharge status: Home / Self Care | Payer: Self-pay | Source: Emergency Department | Attending: Internal Medicine | Admitting: Internal Medicine

## 2007-06-16 LAB — CK-MB PROFILE
CK INDEX: 2.6 %
CK INDEX: 2.7 %
CKMB: 2.5 ng/mL (ref 0.0–4.0)
CKMB: 2.5 ng/mL (ref 0.0–4.0)
CREATINE KINASE TOTAL: 97 IU/L (ref 21–215)
CREATINE KINASE TOTAL: 94 IU/L (ref 21–215)

## 2007-06-16 LAB — PROTHROMBIN TIME
INR: 1 — ABNORMAL LOW (ref 2.0–3.5)
PROTHROMBIN TIME: 10.4 SECONDS (ref 9.5–11.5)

## 2007-06-16 LAB — BLOOD COUNT COMPLETE AUTO&AUTO DIFRNTL WBC
BASOPHIL %: 0.4 % (ref 0.0–2.0)
EOSINOPHIL %: 3.2 % (ref 0.0–7.0)
HEMATOCRIT: 39.4 % (ref 36.0–48.0)
HEMOGLOBIN: 13 g/dl (ref 12.0–16.0)
LYMPHOCYTE %: 24.2 % (ref 13.0–39.0)
MEAN CORP HGB CONC: 33.1 g/dl (ref 32.0–36.0)
MEAN CORPUSCULAR HGB: 29.3 pg (ref 27.0–33.0)
MEAN CORPUSCULAR VOL: 88.7 fl (ref 80.0–100.0)
MEAN PLATELET VOLUME: 8.5 fl (ref 6.4–10.8)
MONOCYTE %: 7.6 % (ref 1.0–12.0)
NEUTROPHIL %: 64.6 % (ref 46.0–79.0)
PLATELET COUNT: 291 10*3/uL (ref 150–400)
RBC DISTRIBUTION WIDTH: 14.8 % — ABNORMAL HIGH (ref 11.5–14.3)
RED BLOOD CELL COUNT: 4.44 M/uL — ABNORMAL LOW (ref 4.50–5.10)
WHITE BLOOD CELL COUNT: 9.5 10*3/uL (ref 4.0–10.8)

## 2007-06-16 LAB — XR CHEST PORTABLE

## 2007-06-16 LAB — APTT: APTT: 29.3 SECONDS (ref 23.7–33.3)

## 2007-06-17 LAB — BLOOD COUNT COMPLETE AUTOMATED
HEMATOCRIT: 39.6 % (ref 36.0–48.0)
HEMOGLOBIN: 13 g/dl (ref 12.0–16.0)
MEAN CORP HGB CONC: 32.7 g/dl (ref 32.0–36.0)
MEAN CORPUSCULAR HGB: 29.8 pg (ref 27.0–33.0)
MEAN CORPUSCULAR VOL: 90.8 fl (ref 80.0–100.0)
MEAN PLATELET VOLUME: 8.2 fl (ref 6.4–10.8)
PLATELET COUNT: 288 10*3/uL (ref 150–400)
RBC DISTRIBUTION WIDTH: 14.3 % (ref 11.5–14.3)
RED BLOOD CELL COUNT: 4.36 M/uL — ABNORMAL LOW (ref 4.50–5.10)
WHITE BLOOD CELL COUNT: 9.3 10*3/uL (ref 4.0–10.8)

## 2007-06-17 LAB — BASIC METABOLIC PANEL
ANION GAP: 6 mmol/L (ref 2–25)
BUN (UREA NITROGEN): 9 mg/dl (ref 6–20)
CALCIUM: 9.4 mg/dl (ref 8.6–10.0)
CARBON DIOXIDE: 25 mmol/L (ref 22–32)
CHLORIDE: 105 mmol/L (ref 101–111)
CREATININE: 0.6 mg/dl (ref 0.4–1.2)
ESTIMATED GLOMERULAR FILT RATE: >60 mL/min (ref 60–116)
Glucose Random: 98 mg/dl (ref 74–160)
POTASSIUM: 4.3 mmol/L (ref 3.5–5.1)
SODIUM: 136 mmol/L (ref 135–144)

## 2007-06-17 LAB — EKG

## 2007-06-17 LAB — GI OPERATIVE NOTE

## 2007-06-17 LAB — BLOOD SUGAR FINGERSTICK (POINT OF CARE): FINGERSTICK GLUCOSE: 99 mg/dl (ref 74–160)

## 2007-06-17 LAB — TROPONIN I: TROPONIN I: 0.01 ng/mL (ref 0.00–0.04)

## 2007-06-17 LAB — H&P

## 2007-06-17 LAB — EMERGENCY ROOM NOTE

## 2007-06-17 LAB — CHG ASSAY OF MAGNESIUM: MAGNESIUM: 1.9 mg/dl (ref 1.8–2.6)

## 2007-06-17 LAB — CHG ASSAY OF PHOSPHORUS INORGANIC: PHOSPHORUS: 3.4 mg/dl (ref 2.7–4.7)

## 2007-06-18 LAB — EKG-12 ** TO BE DONE BY EKG **

## 2007-06-19 LAB — COMPREHENSIVE METABOLIC PANEL
ALANINE AMINOTRANSFERASE: 29 IU/L (ref 7–35)
ALBUMIN: 3.6 g/dl (ref 3.4–4.8)
ALKALINE PHOSPHATASE: 69 IU/L (ref 25–106)
ANION GAP: 7 mmol/L (ref 2–25)
ASPARTATE AMINOTRANSFERASE: 24 IU/L (ref 8–34)
BILIRUBIN TOTAL: 0.4 mg/dl (ref 0.2–1.1)
BUN (UREA NITROGEN): 10 mg/dl (ref 6–20)
CALCIUM: 9 mg/dl (ref 8.6–10.0)
CARBON DIOXIDE: 23 mmol/L (ref 22–32)
CHLORIDE: 103 mmol/L (ref 101–111)
CREATININE: 0.7 mg/dl (ref 0.4–1.2)
ESTIMATED GLOMERULAR FILT RATE: >60 mL/min (ref 60–116)
Glucose Random: 112 mg/dl (ref 74–160)
POTASSIUM: 4.1 mmol/L (ref 3.5–5.1)
SODIUM: 133 mmol/L — ABNORMAL LOW (ref 135–144)
TOTAL PROTEIN: 6.3 g/dl (ref 5.9–7.5)

## 2007-06-19 LAB — HELICOBACTER PYLORI IGG: HELICOBACTER PYLORI IGG: POSITIVE — AB

## 2007-06-19 LAB — CK-MB PROFILE
CK INDEX: 2.5 %
CKMB: 3.3 ng/mL (ref 0.0–4.0)
CREATINE KINASE TOTAL: 131 IU/L (ref 21–215)

## 2007-06-19 LAB — HELICOBACTER PYLORI IGA: HELICOBACTER PYLORI IGA: 1.95 index — AB (ref 0.00–0.88)

## 2007-06-19 LAB — TROPONIN I: TROPONIN I: 0.01 ng/mL (ref 0.00–0.04)

## 2007-06-19 LAB — SURGICAL PATH SPECIMEN

## 2007-06-20 ENCOUNTER — Telehealth (HOSPITAL_BASED_OUTPATIENT_CLINIC_OR_DEPARTMENT_OTHER): Payer: Self-pay

## 2007-06-20 LAB — CONSULTATION

## 2007-06-20 NOTE — Telephone Encounter (Signed)
Called and left message to call back at [facility] to discuss lab results of endoscopy while recently admitted at [facility]. Plan is to discuss results of +H. Pylori. She will need antibiotic tx.

## 2007-06-23 ENCOUNTER — Other Ambulatory Visit (HOSPITAL_BASED_OUTPATIENT_CLINIC_OR_DEPARTMENT_OTHER): Payer: Self-pay | Admitting: Family Medicine

## 2007-06-23 ENCOUNTER — Telehealth (HOSPITAL_BASED_OUTPATIENT_CLINIC_OR_DEPARTMENT_OTHER): Payer: Self-pay | Admitting: Ambulatory Care

## 2007-06-23 MED ORDER — CLARITHROMYCIN 500 MG PO TABS
ORAL_TABLET | ORAL | Status: AC
Start: 2007-06-23 — End: 2007-07-07

## 2007-06-23 MED ORDER — AMOXICILLIN 500 MG PO CAPS
ORAL_CAPSULE | ORAL | Status: AC
Start: 2007-06-23 — End: 2007-07-07

## 2007-06-23 NOTE — Telephone Encounter (Signed)
Call from pharmacy, states patient was given a script for Biaxin and patient is on Zocor. Can not take both due to interaction. Please advise.

## 2007-06-23 NOTE — Telephone Encounter (Signed)
Called patient and informed of +H. Pylori from EGD while at [facility] last week. Spoke with PCP, Dr. Sharma Covert. Will call in Amoxicillin and Clarithromycin bid for 14 days.

## 2007-06-23 NOTE — Telephone Encounter (Signed)
Advised pharmacist to let patient know to stop Zocor during tx and restart 2 days after.

## 2007-06-23 NOTE — Telephone Encounter (Signed)
Staff Message copied by Velna Ochs on Mon Jun 23, 2007 3:19 PM  ------   Message from: [place], [place]   Created: Mon Jun 23, 2007 3:10 PM   Regarding: call back    SHERITA DECOSTE [id number], 51 year old, female, Telephone Information:  Home Phone [phone]  Work Phone Not on file.      CALL BACK NUMBER: [phone]  Cell phone:   Other phone:    Available times:    Patient's language of care: English    Patient does not need an interpreter.    Patient's Care Team:     Person calling on behalf of patient: pharmacy    Calls today pharmcay would like to clarify a rx that was called in. Pls call back.

## 2007-06-23 NOTE — Telephone Encounter (Signed)
Staff Message copied by Caffie Pinto on Mon Jun 23, 2007 2:04 PM  ------   Message from: Bufford Lope   Created: Mon Jun 23, 2007 12:44 PM   Regarding: call patient    Samantha Cameron [id number], 51 year old, female, Telephone Information:  Home Phone [phone]  Work Phone Not on file.      CALL BACK NUMBER: [phone]  Cell phone:   Other phone:    Available times:    Patient's language of care: English    Patient does not need an interpreter.    Patient's PCP:Dr.[email or web address]    Person calling on behalf of patient: patient (self)    Calls today to speak to provider only about labs    Patient's Preferred Pharmacy:   No Pharmacies Listed

## 2007-07-10 LAB — DISCHARGE SUMMARY

## 2007-11-02 ENCOUNTER — Encounter (HOSPITAL_BASED_OUTPATIENT_CLINIC_OR_DEPARTMENT_OTHER): Payer: Self-pay

## 2007-11-02 ENCOUNTER — Emergency Department (HOSPITAL_BASED_OUTPATIENT_CLINIC_OR_DEPARTMENT_OTHER)
Admission: RE | Admit: 2007-11-02 | Disposition: A | Discharge status: Home / Self Care | Payer: Self-pay | Source: Emergency Department | Attending: Neurology | Admitting: Neurology

## 2007-11-02 HISTORY — DX: Dorsalgia, unspecified: M54.9

## 2007-11-02 MED ORDER — PERCOCET 5-325 MG PO TABS
ORAL_TABLET | ORAL | Status: AC
Start: 2007-11-02 — End: 2007-12-02

## 2007-11-02 MED ORDER — FLEXERIL 10 MG PO TABS
ORAL_TABLET | ORAL | Status: AC
Start: 2007-11-02 — End: 2007-12-02

## 2007-11-02 NOTE — ED Notes (Signed)
Back pain for years, awoke this am with unbearable back pain

## 2007-11-02 NOTE — Discharge Instructions (Signed)
Spinal Stenosis    One cause of back pain is spinal stenosis. Stenosis means narrowing. The spinal canal contains and protects the spinal cord and nerve roots. In spinal stenosis, the spinal canal narrows and pinches the spinal cord and nerves. This causes low back pain and pain in the legs. Stenosis may pinch the nerves that control muscle power and sensation in the legs. This leads to pain and abnormal feelings in the muscles and areas supplied by those nerves. Spinal stenosis often comes with aging as boney growths occur in the spinal canal. There is also a loss of the cartilage between the bones of the back which also adds to this problem. Sometimes the problem is present since birth.    SYMPTOMS (what seems to be the problem))    Pain can be made worse with activities.   Numbness, tingling, hot or cold feelings, weakness or a weariness in the legs.   Clumsiness, frequent falling and a foot-slapping gait which may come as a result of nerve damage and muscle weakness.     DIAGNOSIS (how to tell WHAT IS WRONG)   Your caregiver may suspect spinal stenosis if you have unusual leg symptoms as mentioned above.   Your orthopedic surgeon may request special x-rays to find out the cause of the problem. Some of these are:   An MRI (magnetic resonance image) or CAT (computed tomography) scan may be requested.  A myelogram may be done. This is an X-ray taken after a special fluid is injected into the fluid surrounding the spinal cord or spine.    TREATMENT   Sometimes treatments such as postural changes or nonsteroidal anti-inflammatory drugs will relieve the pain.  Flexing the spine by leaning forward while walking may relieve symptoms.   Lying with the knees drawn up to the chest may offer some relief.   These positions enlarge the space available to the nerves and may make it easier for stenosis sufferers to walk longer distances.   Nonsteroidal anti-inflammatory medications such as aspirin or ibuprofen may help  relieve symptoms. They do this by decreasing swelling and inflammation (a redness and soreness).   Rest, followed by a gradual resumption of activity, also can help.   Aerobic activity, such as bicycling, is often recommended.   Losing weight can also relieve some of the load on the spine.   When stenosis causes severe nerve root compression, conservative treatment may not be enough to maintain a normal life style and surgery may be required.    SURGICAL TREATMENT   Your orthopedic surgeon may recommend surgery to relieve the pressure on affected nerves. In properly selected cases, the results are very good, and patients are able to continue a normal lifestyle.     Most of this information courtesy of the [employer].      Document Released: 11/05/2005    [facility] Patient Information 2008 [place], [place].

## 2007-11-09 LAB — EMERGENCY ROOM NOTE

## 2008-06-14 ENCOUNTER — Encounter (HOSPITAL_BASED_OUTPATIENT_CLINIC_OR_DEPARTMENT_OTHER): Payer: Self-pay

## 2008-06-14 ENCOUNTER — Emergency Department (HOSPITAL_BASED_OUTPATIENT_CLINIC_OR_DEPARTMENT_OTHER)
Admission: RE | Admit: 2008-06-14 | Disposition: A | Discharge status: Home / Self Care | Payer: Self-pay | Source: Emergency Department | Attending: Emergency Medicine | Admitting: Emergency Medicine

## 2008-06-14 MED ORDER — PEPCID 40 MG PO TABS
ORAL_TABLET | ORAL | Status: AC
Start: 2008-06-14 — End: 2008-06-26

## 2008-06-14 MED ORDER — PREDNISONE 50 MG PO TABS
ORAL_TABLET | ORAL | Status: AC
Start: 2008-06-14 — End: 2008-06-19

## 2008-06-14 MED ORDER — BENADRYL 25 MG PO CAPS
ORAL_CAPSULE | ORAL | Status: AC
Start: 2008-06-14 — End: 2008-07-14

## 2008-06-14 NOTE — ED Notes (Signed)
Pt woke today with red raised areas of varying size present on chest, back, and arms. C/O hands and wrist being swollen. No resp distress

## 2008-06-16 LAB — EMERGENCY ROOM NOTE

## 2008-07-26 ENCOUNTER — Emergency Department (HOSPITAL_BASED_OUTPATIENT_CLINIC_OR_DEPARTMENT_OTHER)
Admission: RE | Admit: 2008-07-26 | Disposition: A | Discharge status: Home / Self Care | Payer: Self-pay | Source: Emergency Department

## 2008-07-26 ENCOUNTER — Encounter (HOSPITAL_BASED_OUTPATIENT_CLINIC_OR_DEPARTMENT_OTHER): Payer: Self-pay

## 2008-07-26 LAB — EMERGENCY ROOM NOTE

## 2008-07-26 MED ORDER — DIPHENHYDRAMINE HCL 50 MG PO CAPS
ORAL_CAPSULE | ORAL | Status: AC
Start: 2008-07-26 — End: 2008-08-21

## 2008-07-26 MED ORDER — DOXYCYCLINE HYCLATE 100 MG PO TABS
ORAL_TABLET | ORAL | Status: AC
Start: 2008-07-26 — End: 2008-08-05

## 2008-07-26 NOTE — ED Notes (Signed)
Pt bit by ?bugs.  Has several areas of redness.  R upper arm has healing sores that pt clear drainage from these areas.  Woke up today with left eye swelled shut.  Unable to open lids.

## 2009-02-07 ENCOUNTER — Inpatient Hospital Stay (HOSPITAL_BASED_OUTPATIENT_CLINIC_OR_DEPARTMENT_OTHER)
Admission: RE | Admit: 2009-02-07 | Disposition: A | Discharge status: Home / Self Care | Payer: Self-pay | Source: Emergency Department | Attending: Emergency Medicine | Admitting: Emergency Medicine

## 2009-02-07 ENCOUNTER — Encounter (HOSPITAL_BASED_OUTPATIENT_CLINIC_OR_DEPARTMENT_OTHER): Payer: Self-pay

## 2009-02-07 LAB — TROPONIN I
TROPONIN I: <0.01 ng/mL (ref 0.00–0.04)
TROPONIN I: <0.01 ng/mL (ref 0.00–0.04)

## 2009-02-07 LAB — PROTHROMBIN TIME
INR: 1 — ABNORMAL LOW (ref 2.0–3.5)
PROTHROMBIN TIME: 10 SECONDS (ref 9.5–11.5)

## 2009-02-07 LAB — COMPREHENSIVE METABOLIC PANEL
ALANINE AMINOTRANSFERASE: 23 IU/L (ref 7–35)
ALBUMIN: 3.9 g/dl (ref 3.4–4.8)
ALKALINE PHOSPHATASE: 88 IU/L (ref 25–106)
ANION GAP: 4 mmol/L (ref 2–25)
ASPARTATE AMINOTRANSFERASE: 28 IU/L (ref 8–34)
BILIRUBIN TOTAL: 0.7 mg/dl (ref 0.2–1.1)
BUN (UREA NITROGEN): 8 mg/dl (ref 6–20)
CALCIUM: 9.5 mg/dl (ref 8.6–10.3)
CARBON DIOXIDE: 27 mmol/L (ref 22–32)
CHLORIDE: 105 mmol/L (ref 101–111)
CREATININE: 0.3 mg/dl — ABNORMAL LOW (ref 0.4–1.2)
ESTIMATED GLOMERULAR FILT RATE: >60 mL/min (ref >60)
Glucose Random: 94 mg/dl (ref 74–160)
POTASSIUM: 4.7 mmol/L (ref 3.5–5.1)
SODIUM: 136 mmol/L (ref 135–144)
TOTAL PROTEIN: 6.5 g/dl (ref 5.9–7.5)

## 2009-02-07 LAB — CK-MB PROFILE
CK INDEX: 2.4 %
CK INDEX: 2.7 %
CKMB: 2.2 ng/mL (ref 0.0–4.0)
CKMB: 2 ng/mL (ref 0.0–4.0)
CREATINE KINASE TOTAL: 93 IU/L (ref 21–215)
CREATINE KINASE TOTAL: 73 IU/L (ref 21–215)

## 2009-02-07 LAB — BLOOD COUNT COMPLETE AUTO&AUTO DIFRNTL WBC
BASOPHIL %: 0.8 % (ref 0.0–2.0)
EOSINOPHIL %: 2.8 % (ref 0.0–7.0)
HEMATOCRIT: 40.3 % (ref 36.0–48.0)
HEMOGLOBIN: 13.8 g/dl (ref 12.0–16.0)
LYMPHOCYTE %: 21.8 % (ref 13.0–39.0)
MEAN CORP HGB CONC: 34.3 g/dl (ref 32.0–36.0)
MEAN CORPUSCULAR HGB: 31 pg (ref 27.0–33.0)
MEAN CORPUSCULAR VOL: 90.4 fl (ref 80.0–100.0)
MEAN PLATELET VOLUME: 8.7 fl (ref 6.4–10.8)
MONOCYTE %: 7 % (ref 1.0–12.0)
NEUTROPHIL %: 67.6 % (ref 46.0–79.0)
PLATELET COUNT: 261 10*3/uL (ref 150–400)
RBC DISTRIBUTION WIDTH: 14.1 % (ref 11.5–14.3)
RED BLOOD CELL COUNT: 4.46 M/uL — ABNORMAL LOW (ref 4.50–5.10)
WHITE BLOOD CELL COUNT: 9.3 10*3/uL (ref 4.0–10.8)

## 2009-02-07 LAB — XR CHEST PORTABLE

## 2009-02-07 LAB — APTT: APTT: 28.7 SECONDS (ref 23.7–33.3)

## 2009-02-07 LAB — BLOOD SUGAR FINGERSTICK (POINT OF CARE): FINGERSTICK GLUCOSE: 87 mg/dl (ref 74–160)

## 2009-02-07 NOTE — ED Notes (Signed)
Woke up this morning around 6am feeling SOB, and chest pressure, R arm numbness, pt also states that she has been coughing x 4 days. Pt warm and dry in triage.

## 2009-02-08 LAB — BASIC METABOLIC PANEL
ANION GAP: 6 mmol/L (ref 2–25)
BUN (UREA NITROGEN): 7 mg/dl (ref 6–20)
CALCIUM: 9 mg/dl (ref 8.6–10.3)
CARBON DIOXIDE: 27 mmol/L (ref 22–32)
CHLORIDE: 104 mmol/L (ref 101–111)
CREATININE: 0.6 mg/dl (ref 0.4–1.2)
ESTIMATED GLOMERULAR FILT RATE: >60 mL/min (ref >60)
Glucose Random: 87 mg/dl (ref 74–160)
POTASSIUM: 3.9 mmol/L (ref 3.5–5.1)
SODIUM: 137 mmol/L (ref 135–144)

## 2009-02-08 LAB — CHG LIPID PANEL
Cholesterol: 197 mg/dl (ref 0–200)
HIGH DENSITY LIPOPROTEIN: 38 mg/dl (ref 35–85)
LOW DENSITY LIPOPROTEIN DIRECT: 111 mg/dl — ABNORMAL HIGH (ref 0–100)
RISK FACTOR: 5.2 — ABNORMAL HIGH (ref ?–4.4)
TRIGLYCERIDES: 189 mg/dl — ABNORMAL HIGH (ref 0–150)

## 2009-02-08 LAB — CK-MB PROFILE
CK INDEX: 2.6 %
CKMB: 1.6 ng/mL (ref 0.0–4.0)
CREATINE KINASE TOTAL: 62 IU/L (ref 21–215)

## 2009-02-08 LAB — TROPONIN I: TROPONIN I: <0.01 ng/mL (ref 0.00–0.04)

## 2009-02-08 LAB — BLOOD SUGAR FINGERSTICK (POINT OF CARE)
FINGERSTICK GLUCOSE: 101 mg/dl (ref 74–160)
FINGERSTICK GLUCOSE: 98 mg/dl (ref 74–160)
FINGERSTICK GLUCOSE: 114 mg/dl (ref 74–160)
FINGERSTICK GLUCOSE: 100 mg/dl (ref 74–160)

## 2009-02-08 LAB — BLOOD COUNT COMPLETE AUTOMATED
HEMATOCRIT: 37.6 % (ref 36.0–48.0)
HEMOGLOBIN: 13 g/dl (ref 12.0–16.0)
MEAN CORP HGB CONC: 34.5 g/dl (ref 32.0–36.0)
MEAN CORPUSCULAR HGB: 31.2 pg (ref 27.0–33.0)
MEAN CORPUSCULAR VOL: 90.3 fl (ref 80.0–100.0)
MEAN PLATELET VOLUME: 8.6 fl (ref 6.4–10.8)
PLATELET COUNT: 248 10*3/uL (ref 150–400)
RBC DISTRIBUTION WIDTH: 13.9 % (ref 11.5–14.3)
RED BLOOD CELL COUNT: 4.17 M/uL — ABNORMAL LOW (ref 4.50–5.10)
WHITE BLOOD CELL COUNT: 9.5 10*3/uL (ref 4.0–10.8)

## 2009-02-08 LAB — EMERGENCY ROOM NOTE

## 2009-02-08 LAB — CARD PERF SPECT FULL.

## 2009-02-08 LAB — EKG

## 2009-02-10 LAB — CONSULTATION

## 2009-02-15 LAB — H&P

## 2009-02-16 LAB — CARDIAC STRESS TEST: EXERCISE/TREADMILL

## 2009-04-13 HISTORY — PX: TOTAL KNEE REPLACEMENT: 100043

## 2009-06-29 ENCOUNTER — Emergency Department (HOSPITAL_BASED_OUTPATIENT_CLINIC_OR_DEPARTMENT_OTHER)
Admission: RE | Admit: 2009-06-29 | Disposition: A | Discharge status: Home / Self Care | Payer: Self-pay | Source: Emergency Department | Attending: Medical | Admitting: Medical

## 2009-06-29 ENCOUNTER — Encounter (HOSPITAL_BASED_OUTPATIENT_CLINIC_OR_DEPARTMENT_OTHER): Payer: Self-pay

## 2009-06-29 MED ORDER — FLEXERIL 10 MG PO TABS
ORAL_TABLET | ORAL | Status: AC
Start: 2009-06-29 — End: 2009-07-29

## 2009-06-29 MED ORDER — IBUPROFEN 800 MG PO TABS
ORAL_TABLET | ORAL | Status: AC
Start: 2009-06-29 — End: 2009-07-29

## 2009-06-29 MED ORDER — OXYCODONE HCL 5 MG PO TABS
ORAL_TABLET | ORAL | Status: AC
Start: 2009-06-29 — End: 2009-07-06

## 2009-06-29 NOTE — Discharge Instructions (Signed)
Apply warm compresses and stretch 3 times daily.  Do not drive while taking Percocet or Flexeril.  Do not drink grapefruit juice while taking Flexeril.  Do not lift anything over 10 lbs for 1 week.

## 2009-06-29 NOTE — ED Notes (Signed)
Severe lower back pain, started 2 hrs ago. Radiating to both legs.

## 2009-07-01 LAB — EMERGENCY ROOM NOTE

## 2009-08-11 HISTORY — PX: WRIST GANGLION EXCISION: SHX840

## 2010-03-06 ENCOUNTER — Encounter (HOSPITAL_BASED_OUTPATIENT_CLINIC_OR_DEPARTMENT_OTHER): Payer: Self-pay

## 2010-03-06 ENCOUNTER — Emergency Department (HOSPITAL_BASED_OUTPATIENT_CLINIC_OR_DEPARTMENT_OTHER)
Admission: RE | Admit: 2010-03-06 | Disposition: A | Discharge status: Home / Self Care | Payer: Self-pay | Source: Emergency Department | Attending: Emergency Medicine | Admitting: Emergency Medicine

## 2010-03-06 NOTE — Discharge Instructions (Signed)
Index Related topics   Migraine Headache   What is a migraine headache?   A migraine headache is a special kind of headache that can last for hours to days. It can cause intense pain as well as other symptoms, such as feeling sick to your stomach or having changes in your vision.   How does it occur?   The exact cause of migraines is not known. Migraines may be related to a problem with the blood flow in your brain or they may happen when brain chemicals don't stay balanced. Migraine headaches tend to run in families and often are triggered by specific things. Common migraine triggers include:   · stress   · tiredness   · changes in the weather   · certain foods, such as wine, cheese, or chocolate   · MSG or food preservatives, such as nitrates   · red wine   · bright lights.   Migraines tend to run in families. They affect women 3 times more often than men. They often occur during, or right before, a woman's menstrual period. Or they may happen when a woman is taking hormone pills.   What are the symptoms?   Before a migraine starts, there is often a warning period when you don't feel well. Some people lose part of their vision or see bright spots or zigzag patterns in front of their eyes. These symptoms, which may precede and predict a migraine headache, are called migraine aura. The vision changes of the aura usually go away as the headache begins. Many people with migraines do not have the visual symptoms.   Migraine symptoms may include:   · throbbing or pounding headache   · extreme sensitivity to light and noise   · nausea and vomiting.   The pain is usually more severe on one side of the head but can affect the whole head.   Sometimes a migraine can cause symptoms such as numbness or even weakness. However, these can also be symptoms of a stroke. If you have these other symptoms along with problems with your vision, do not assume a migraine is the cause. Call your healthcare provider right away.   How is  it diagnosed?   Your healthcare provider will ask about your symptoms and medical history and examine you. There are no lab tests or X-rays for diagnosing migraine headaches. However, your provider may order an imaging study of the brain to rule out problems in the brain that can cause other types of headaches.   A careful history of your headaches is very helpful. Your healthcare provider may ask you to keep a headache diary in which you record the following:   · date and time of each attack   · how long the headache lasted   · type of pain (for example, dull, sharp, throbbing, or a feeling of pressure)   · location of pain   · any symptoms before the headache began   · foods and drinks you had before the headache began (This should include checking the ingredients in the product ingredient list of packaged foods you have eaten. It may be easier to save the labels of foods you think may be related to your headaches.)   · use of cigarettes, caffeine, alcohol, or carbonated drinks before the headache began   · time you went to bed and time you got up before the headache began   · if you are a woman, the dates of your   menstrual periods and use of birth control pills or other female hormones.   Depending on your headache symptoms and physical exam, your provider may recommend tests to check for other, more serious causes of your symptoms. For example, you may have a brain scan or magnetic resonance imaging (MRI).   How is it treated?   Your healthcare provider may prescribe medicine that you can take as soon as you start having symptoms of a migraine. The medicine will help keep headaches from becoming severe once they start. Medicines most often used for this purpose are:   · A group of drugs called triptans available as tablets (including some that may be taken without water), a shot, and a nasal spray. Examples of triptans are almotriptan, eletriptan, frovatriptan, naratriptan, rizatriptan, sumatriptan, and  zolmitriptan.   · Ergot medicines such as ergotamine, dihydroergotamine, ergonovine, and methysergide. Some of these medicines are shots your provider can give you, or you may learn how to give them to yourself.   It's best to take these medicines as soon as possible after a headache begins. This means you need to recognize the warning symptoms.   If you have frequent migraines (more than 1 a month), you may need to take other medicine every day to prevent severe and frequent headaches. Examples of drugs your provider may prescribe for this purpose are propranolol, verapamil, and antidepressants. You may not know if the medicine works for you until you have tried it for several weeks.   If you are planning to get pregnant, be sure to talk to your healthcare provider about whether the medicines you have been prescribed are safe during pregnancy. If they are not known to be safe, you will need a different treatment plan while you are pregnant and breastfeeding.   How long will the effects last?   The headache may last from a few hours to a few days. You may tend to get migraines for the rest of your life. However, many people find that they have migraines less often as they get older.   How can I take care of myself?   When a migraine begins:   · As soon as possible after headache symptoms begin, take the medicine recommended or prescribed by your healthcare provider.   · Rest in a quiet, dark room until the symptoms are gone. Putting a cool, moist washcloth on your forehead might help.   Don't drive a car while you are having a migraine headache.   If your symptoms don't get better when you take medicine, make another appointment with your healthcare provider. It may take several visits to find the best way to control your headaches. Also, if you are having headaches more often, make a follow-up appointment with your provider to see if you need more testing or preventive medicine.   Call your healthcare provider  right away if:   · You have symptoms that are not usually part of your migraines, such as:   ¨ trouble talking or slurred speech   ¨ arm or leg weakness   · You have other symptoms such as:   ¨ fever   ¨ stiff neck   ¨ repeated vomiting for several hours   ¨ inability to move a body part (paralysis).   · You are pregnant and your headache is particularly bad or it seems different from your usual migraines, particularly in the last half of pregnancy. This is especially important if you have problems with your vision such   as flashing lights, difficulty focusing or blurriness, any nausea or vomiting, or weakness in any part of your body. These may be signs of a developing pregnancy problem that needs immediate attention.   How can I help prevent migraine headaches?   Prevention is an important part of treatment. To help prevent migraine headaches:   · You may need to take medicine prescribed by your healthcare provider.   · You may need to avoid certain foods or activities suggested by your headache diary as possible triggers of headaches. Avoid foods from the following list if eating them seems to cause your headaches:   ¨ wine, ale, and beer   ¨ aged and processed cheeses   ¨ aged, canned, cured, and processed meats (such as hot dogs, lunchmeats, Spam)   ¨ breads made with yeast and yeast extracts   ¨ foods containing cheese, chocolate, or nuts   · Ask your provider about avoiding medicines that may trigger headaches.   · If you are taking birth control pills or other female hormones, ask your provider if you should stop taking them.   · Avoid smoking.   · Eat regular, healthy meals. Don't go too long without eating.   · Get regular rest and exercise.   · Try to reduce stress. Relaxation exercises and biofeedback may help you manage stress.   For more information, call or write:   American Council for Headache Education (ACHE)   Phone: 800-255-ACHE (255-2243)   Web site: http://www.achenet.org   Educational  materials, referrals to support groups   National Headache Foundation   Phone: 800-843-2256   Web site: http://www.headaches.org   Educational materials, list of headache specialists, information specialists   Developed by [employer].   Published by [employer].   Last modified: 2008-07-02   Last reviewed: 2008-04-19   This content is reviewed periodically and is subject to change as new health information becomes available. The information is intended to inform and educate and is not a replacement for medical evaluation, advice, diagnosis or treatment by a healthcare professional.   Adult [employer] Advisor 2010.1 Index  © 2010 [employer] and/or its affiliates. All Rights Reserved.

## 2010-03-06 NOTE — ED Notes (Signed)
States Saturday night at 10PM started with headache.  "This is worse than a headache."  Feels hot and cold.  Denies nausea or vomiting.  States nothing she tries helps.

## 2010-03-07 LAB — EMERGENCY ROOM NOTE

## 2010-12-20 ENCOUNTER — Emergency Department (HOSPITAL_BASED_OUTPATIENT_CLINIC_OR_DEPARTMENT_OTHER)
Admission: RE | Admit: 2010-12-20 | Disposition: A | Discharge status: Home / Self Care | Payer: Self-pay | Source: Emergency Department | Attending: Physician Assistant | Admitting: Physician Assistant

## 2010-12-20 ENCOUNTER — Encounter (HOSPITAL_BASED_OUTPATIENT_CLINIC_OR_DEPARTMENT_OTHER): Payer: Self-pay | Admitting: Physician Assistant

## 2010-12-20 HISTORY — DX: Essential (primary) hypertension: I10

## 2010-12-20 MED ORDER — KETOROLAC TROMETHAMINE 60 MG/2ML IM SOLN
60.00 mg | Freq: Once | INTRAMUSCULAR | Status: AC
Start: 2010-12-20 — End: 2010-12-20
  Administered 2010-12-20: 60 mg via INTRAMUSCULAR
  Filled 2010-12-20: qty 2

## 2010-12-20 MED ORDER — ACETAMINOPHEN 325 MG PO TABS
650.00 mg | ORAL_TABLET | Freq: Four times a day (QID) | ORAL | Status: AC | PRN
Start: 2010-12-20 — End: 2011-01-19

## 2010-12-20 MED ORDER — IBUPROFEN 600 MG PO TABS
600.0000 mg | ORAL_TABLET | Freq: Four times a day (QID) | ORAL | Status: DC | PRN
Start: 2010-12-20 — End: 2012-02-02

## 2010-12-20 MED ORDER — DIAZEPAM 5 MG PO TABS
5.00 mg | ORAL_TABLET | Freq: Once | ORAL | Status: AC
Start: 2010-12-20 — End: 2010-12-20
  Administered 2010-12-20: 5 mg via ORAL
  Filled 2010-12-20: qty 1

## 2010-12-20 MED ORDER — DIAZEPAM 5 MG PO TABS
5.00 mg | ORAL_TABLET | Freq: Three times a day (TID) | ORAL | Status: AC
Start: 2010-12-20 — End: 2010-12-24

## 2010-12-20 NOTE — Discharge Instructions (Signed)
DISCHARGE:  DIAGNOSIS:  Chronic back pain    WHAT YOU CAME IN FOR:  You came into the ER with back pain.    WHAT WE DID WHILE YOU WERE HERE:  We gave you toradol and diazepam in the ER.  We prescribed you Tylenol 650mg  every 6 hours and Ibuprofen 600mg  every 6 hours for pain.    We prescribed you Diazepam 5 mg every 8 hours to relax your muscle.  This medication can make you drowsy for avoid driving while taking it.    WHAT TO DO NEXT:  Rest your back, avoid any strenuous activity.  Ice for 20 minutes 3-4 times daily for 3 days.  Follow up with your PCP in 2 days.    WHEN TO RETURN:  Return to ER if your condition worsens.  If you lose bowel or bladder function.  If you lose function or sensation in your lower extremities, or your pain becomes much more intense.       Index Spanish version Illustration Rehabilitation Exercises     Low Back Pain   What is low back pain?   Low back pain is pain and stiffness in the lower back. It is one of the most common reasons people miss work.   How does it occur?   Low back pain is usually caused when a ligament or muscle holding a vertebra in its proper position is strained. Vertebrae are bones that make up the spinal column through which the spinal cord passes. When these muscles or ligaments become weak or strained, the spine loses its stability, resulting in pain.   Low back pain can occur if your job involves lifting and carrying heavy objects, or if you spend a lot of time sitting or standing in one position or bending over. It can be caused by a fall or by unusually strenuous exercise. It can be brought on by the tension and stress that cause headaches in some people. It can even be brought on by violent sneezing or coughing.   People who are overweight may have low back pain because of the added stress on their back.   Back pain may occur when the muscles, joints, bones, and connective tissues of the back become inflamed as a result of an infection or an immune system  problem. Arthritic disorders as well as some congenital and degenerative conditions may cause back pain.   Back pain accompanied by loss of bladder or bowel control, trouble moving your legs, or numbness or tingling in your arms or legs requires immediate medical treatment.   What are the symptoms?   Symptoms include:    pain in the back or legs    stiffness, spasm, or limited motion   The pain may be constant or may happen only in certain positions. It may get worse when you cough, sneeze, bend, twist, or strain during a bowel movement. The pain may be in only one spot or may spread to other areas, most commonly down the buttocks and into the back of the thigh.   A low back strain typically does not produce pain past the knee into the calf or foot. Tingling or numbness in the calf or foot may indicate a herniated disk or pinched nerve.   Be sure to see your healthcare provider if:    You have weakness in your leg, especially if you cannot lift your foot, because this may be a sign of nerve damage.    You have new bowel  or bladder problems as well as back pain, which may be a sign of severe injury to your spinal cord.    You have pain that gets worse despite treatment.   How is it diagnosed?   Your healthcare provider will review your medical history and examine you. You may have X-rays, an MRI, CT scan, or bone.   How is it treated?   The early stages of back pain with muscle spasms should be treated with ice packs for 20 to 30 minutes every 4 to 6 hours for the first 2 to 3 days. You may lie on a frozen gel pack, crushed ice, or a bag of frozen peas.   The following are ways to treat low back pain:    Use a heating pad or hot water bottle.    Rest in bed on a firm mattress. Often it helps to lie on your back with your knees raised. However, some people prefer to lie on their side with their knees bent. It's best to try to stay active, so try not to rest in bed longer than 1 to 2 days.    Take aspirin,  ibuprofen, or other anti-inflammatory medicines; muscle relaxants; or other pain medicines if recommended by your healthcare provider. Nonsteroidal anti-inflammatory (NSAIDs) medicines can cause stomach bleeding, kidney problems, and other problems. Take the medicine as directed. Read and follow all label directions. NSAIDs should not be taken for more than 10 days for pain or 3 days for fever. They should not be taken for other reasons unless recommended by your healthcare provider.    Get a back massage by a trained person.    Wear a belt or corset to support your back.    Do the exercises recommended by your provider. Your provider may also have you go to a physical therapist for rehabilitation and other treatments.    Talk with a counselor, if your back pain is related to tension caused by emotional problems.   When the pain is gone, ask your healthcare provider about starting an exercise program such as the following:    Exercise moderately every day, using stretching and warm-up exercises suggested by your provider or physical therapist.    Exercise vigorously for about 30 minutes 3 times a week by walking, swimming, using a stationary bicycle, or doing low-impact aerobics.   Exercising regularly will not only help your back, it will also help keep you healthier overall.   How long will the effects last?   The effects of back pain last as long as the cause exists or until your body recovers from the strain, usually a day or two but sometimes weeks.   How can I take care of myself?   In addition to the treatment described above, keep in mind these suggestions:    Practice good posture. Stand with your head up, shoulders straight, chest forward, weight balanced evenly on both feet, and pelvis tucked in.    Lose weight if you are overweight    Try putting an ice pack wrapped in a towel on your back for 20 minutes, 1 to 4 times per day. Set an alarm to avoid frostbite from using the ice pack too long.     If your back muscles are stiff, try an electric heating pad on a low setting (or a hot water bottle wrapped in a towel to avoid burning yourself) for 20 to 30 minutes. Don't let the heating pad get too hot, and don't fall  asleep with it. You could get a burn.    Put a pillow under your knees when you are lying down.    Sleep without a pillow under your head.   Pain is the best way to judge the pace you should set in increasing your activity and exercise. Minor discomfort, stiffness, soreness, and mild aches need not interfere with activity. However, limit your activities temporarily if:    Your symptoms return.    The pain increases when you are more active.    The pain increases within 24 hours after a new or higher level of activity.   When can I return to my normal activities?   Everyone recovers from an injury at a different rate. Return to your activities depends on how soon your back recovers, not by how many days or weeks it has been since your injury has occurred. In general, the longer you have symptoms before you start treatment, the longer it will take to get better. The goal is to return to your normal activities as soon as is safely possible. If you return too soon you may worsen your injury.   It is important that you have fully recovered from your low back pain before you return to any strenuous activity. You must be able to have the same range of motion that you had before your injury. You must be able to walk and twist without pain.   What can I do to help prevent low back pain?   You can reduce the strain on your back by doing the following:    Don't push with your arms when you move a heavy object. Turn around and push backwards so the strain is taken by your legs.    Whenever you sit, sit in a straight-backed chair and hold your spine against the back of the chair.    Bend your knees and hips and keep your back straight when you lift a heavy object.    Avoid lifting heavy objects  higher than your waist.    Hold packages you carry close to your body, with your arms bent.    Use a footrest for one foot when you stand or sit in one spot for a long time. This keeps your back straight.    Bend your knees when you bend over.    Sit close to the pedals when you drive and use your seat belt and a hard backrest or pillow.    Lie on your side with your knees bent when you sleep or rest. It may help to put a pillow between your knees.    Put a pillow under your knees when you sleep on your back.    Raise the foot of the bed 8 inches to discourage sleeping on your stomach unless you have other problems that require that you keep your head elevated.   To rest your back, hold each of these positions for 5 minutes or longer:    Lie on your back, bend your knees, and put pillows under your knees.    Lie on your back on the floor with a pillow under your neck. Bend your knees to a 90-degree angle, and put your lower legs and feet on a chair.    Lie on your back, bend your knees, and bring one knee up to your chest and hold it there. Repeat with the other knee, then bring both knees to your chest. When holding your knee to your chest, grab  your thigh rather than your lower leg to avoid over flexing your knee.   Developed by Thana Ates Excell Seltzer, RN, [place], and [employer].   Published by [employer].   Last modified: 2008-12-22   Last reviewed: 2007-05-26   This content is reviewed periodically and is subject to change as new health information becomes available. The information is intended to inform and educate and is not a replacement for medical evaluation, advice, diagnosis or treatment by a healthcare professional.   Adult Health Advisor 2010.1 Index   2010 [employer] and/or its affiliates. All Rights Reserved.

## 2010-12-20 NOTE — ED Notes (Signed)
Pt states when she awoke this AM her lower back "felt sore."  Pain started to increase during day.  Using heat and ice without effect.  Past 1 1/2 hours, pain radiating down legs causing her to fall.  Denies injury from fall.

## 2010-12-22 LAB — EMERGENCY ROOM NOTE

## 2012-02-01 ENCOUNTER — Inpatient Hospital Stay (HOSPITAL_BASED_OUTPATIENT_CLINIC_OR_DEPARTMENT_OTHER)
Admission: RE | Admit: 2012-02-01 | Disposition: A | Discharge status: Home / Self Care | Payer: Self-pay | Source: Emergency Department

## 2012-02-01 ENCOUNTER — Encounter (HOSPITAL_BASED_OUTPATIENT_CLINIC_OR_DEPARTMENT_OTHER): Payer: Self-pay

## 2012-02-01 DIAGNOSIS — R0902 Hypoxemia: Secondary | ICD-10-CM

## 2012-02-01 DIAGNOSIS — R079 Chest pain, unspecified: Secondary | ICD-10-CM

## 2012-02-01 DIAGNOSIS — J4489 Other specified chronic obstructive pulmonary disease: Secondary | ICD-10-CM

## 2012-02-01 DIAGNOSIS — J449 Chronic obstructive pulmonary disease, unspecified: Secondary | ICD-10-CM | POA: Insufficient documentation

## 2012-02-01 DIAGNOSIS — E119 Type 2 diabetes mellitus without complications: Secondary | ICD-10-CM | POA: Insufficient documentation

## 2012-02-01 DIAGNOSIS — E1129 Type 2 diabetes mellitus with other diabetic kidney complication: Secondary | ICD-10-CM | POA: Insufficient documentation

## 2012-02-01 HISTORY — DX: Hypoxemia: R09.02

## 2012-02-01 HISTORY — DX: Unspecified osteoarthritis, unspecified site: M19.90

## 2012-02-01 HISTORY — DX: Gastro-esophageal reflux disease without esophagitis: K21.9

## 2012-02-01 HISTORY — DX: Chest pain, unspecified: R07.9

## 2012-02-01 HISTORY — DX: Other specified chronic obstructive pulmonary disease: J44.89

## 2012-02-01 HISTORY — DX: Chronic obstructive pulmonary disease, unspecified: J44.9

## 2012-02-01 LAB — MANUAL WBC DIFFERENTIAL
EOSINOPHILS %: 5 % (ref 0.0–7.0)
LYMPHOCYTES %: 40 % (ref 15.0–54.0)
MONOCYTES %: 4 % — ABNORMAL LOW (ref 4.0–13.0)
PLATELET ESTIMATE: NORMAL
PLATELET MORPHOLOGY (ABNORMAL): NORMAL
POLYMORPHONUCLEAR (SEGS) %: 51 % (ref 40.0–75.0)

## 2012-02-01 LAB — COMPREHENSIVE METABOLIC PANEL
ALANINE AMINOTRANSFERASE: 25 IU/L (ref 7–35)
ALBUMIN: 3.6 g/dl (ref 3.4–4.8)
ALKALINE PHOSPHATASE: 66 IU/L (ref 25–106)
ANION GAP: 8 mmol/L (ref 3–11)
ASPARTATE AMINOTRANSFERASE: 18 IU/L (ref 8–34)
BILIRUBIN TOTAL: 0.3 mg/dl (ref 0.2–1.1)
BUN (UREA NITROGEN): 11 mg/dl (ref 6–20)
CALCIUM: 9.1 mg/dl (ref 8.6–10.3)
CARBON DIOXIDE: 26 mmol/L (ref 22–32)
CHLORIDE: 104 mmol/L (ref 101–111)
CREATININE: 0.6 mg/dl (ref 0.4–1.2)
ESTIMATED GLOMERULAR FILT RATE: >60 mL/min (ref >60)
Glucose Random: 103 mg/dl (ref 74–160)
POTASSIUM: 4.2 mmol/L (ref 3.5–5.1)
SODIUM: 138 mmol/L (ref 135–144)
TOTAL PROTEIN: 6.3 g/dl (ref 5.9–7.5)

## 2012-02-01 LAB — PROTHROMBIN TIME
INR: <1 — ABNORMAL LOW (ref 2.0–3.5)
PROTHROMBIN TIME: 10.4 SECONDS (ref 9.5–11.5)

## 2012-02-01 LAB — CBC, PLATELET RFLX MAN DIFF
HEMATOCRIT: 38.6 % (ref 34.1–44.9)
HEMOGLOBIN: 12.6 g/dL (ref 11.2–15.7)
MEAN CORP HGB CONC: 32.6 g/dL (ref 31.0–37.0)
MEAN CORPUSCULAR HGB: 29.6 pg (ref 26.0–34.0)
MEAN CORPUSCULAR VOL: 90.8 fL (ref 80.0–100.0)
MEAN PLATELET VOLUME: 10.6 fL (ref 8.7–12.5)
PLATELET COUNT: 283 10*3/uL (ref 150–400)
RBC DISTRIBUTION WIDTH STD DEV: 48.2 fL — ABNORMAL HIGH (ref 35.1–46.3)
RBC DISTRIBUTION WIDTH: 14.8 % — ABNORMAL HIGH (ref 11.5–14.3)
RED BLOOD CELL COUNT: 4.25 M/uL (ref 3.90–5.20)
WHITE BLOOD CELL COUNT: 11.1 10*3/uL — ABNORMAL HIGH (ref 4.0–11.0)

## 2012-02-01 LAB — TROPONIN I
TROPONIN I: 0.01 ng/mL (ref 0.00–0.04)
TROPONIN I: <0.01 ng/mL (ref 0.00–0.04)
TROPONIN I: 0.01 ng/mL (ref 0.00–0.04)

## 2012-02-01 LAB — APTT: APTT: 27.7 SECONDS (ref 23.7–33.3)

## 2012-02-01 LAB — LIPASE: LIPASE: 47 U/L (ref 10–50)

## 2012-02-01 LAB — D-DIMER PE/DVT, QUANTITATIVE: D-DIMER PE/DVT, QUANTITATIVE: <0.19 mg/L FEU (ref 0.00–0.49)

## 2012-02-01 LAB — XR CHEST 2 VIEWS

## 2012-02-01 MED ORDER — ALUMINUM-MAGNESIUM-SIMETHICONE 200-200-20 MG/5ML PO SUSP
30.0000 mL | Freq: Once | ORAL | Status: DC
Start: 2012-02-01 — End: 2012-02-01

## 2012-02-01 MED ORDER — IPRATROPIUM-ALBUTEROL 18-103 MCG/ACT IN AERO
2.0000 | INHALATION_SPRAY | Freq: Four times a day (QID) | RESPIRATORY_TRACT | Status: DC
Start: 2012-02-01 — End: 2012-02-02
  Administered 2012-02-01 – 2012-02-02 (×3): 2 via RESPIRATORY_TRACT
  Filled 2012-02-01: qty 14.7

## 2012-02-01 MED ORDER — MORPHINE SULFATE (PF) 4 MG/ML IV SOLN
4.00 mg | Freq: Once | INTRAVENOUS | Status: AC
Start: 2012-02-01 — End: 2012-02-01
  Administered 2012-02-01: 4 mg via INTRAVENOUS
  Filled 2012-02-01: qty 1

## 2012-02-01 MED ORDER — NITROGLYCERIN 0.4 MG SL SUBL
0.40 mg | SUBLINGUAL_TABLET | Freq: Once | SUBLINGUAL | Status: AC
Start: 2012-02-01 — End: 2012-02-01
  Administered 2012-02-01: 0.4 mg via SUBLINGUAL
  Filled 2012-02-01: qty 1

## 2012-02-01 MED ORDER — PANTOPRAZOLE SODIUM 40 MG PO TBEC
40.0000 mg | DELAYED_RELEASE_TABLET | Freq: Every day | ORAL | Status: DC
Start: 2012-02-02 — End: 2012-02-02
  Administered 2012-02-02: 40 mg via ORAL
  Filled 2012-02-01: qty 1

## 2012-02-01 MED ORDER — SODIUM CHLORIDE 0.9 % IV BOLUS
1000.00 mL | Freq: Once | INTRAVENOUS | Status: AC
Start: 2012-02-01 — End: 2012-02-01
  Administered 2012-02-01: 1000 mL via INTRAVENOUS

## 2012-02-01 MED ORDER — NITROGLYCERIN 0.4 MG SL SUBL
0.4000 mg | SUBLINGUAL_TABLET | SUBLINGUAL | Status: DC | PRN
Start: 2012-02-01 — End: 2012-02-02

## 2012-02-01 MED ORDER — ALUMINUM-MAGNESIUM-SIMETHICONE 200-200-20 MG/5ML PO SUSP
30.0000 mL | Freq: Four times a day (QID) | ORAL | Status: DC
Start: 2012-02-01 — End: 2012-02-01
  Administered 2012-02-01: 30 mL via ORAL
  Filled 2012-02-01: qty 30

## 2012-02-01 MED ORDER — VENLAFAXINE HCL ER 75 MG PO CP24
75.0000 mg | ORAL_CAPSULE | Freq: Every day | ORAL | Status: DC
Start: 2012-02-01 — End: 2012-02-02
  Administered 2012-02-01 – 2012-02-02 (×2): 75 mg via ORAL
  Filled 2012-02-01 (×2): qty 1

## 2012-02-01 MED ORDER — VENLAFAXINE HCL 25 MG PO TABS
75.0000 mg | ORAL_TABLET | Freq: Every day | ORAL | Status: DC
Start: 2012-02-01 — End: 2012-02-01

## 2012-02-01 MED ORDER — NICOTINE 21 MG/24HR TD PT24
1.0000 | MEDICATED_PATCH | Freq: Every day | TRANSDERMAL | Status: DC
Start: 2012-02-01 — End: 2012-02-02
  Administered 2012-02-01 – 2012-02-02 (×2): 1 via TRANSDERMAL
  Filled 2012-02-01 (×2): qty 1

## 2012-02-01 MED ORDER — PANTOPRAZOLE SODIUM 40 MG IV SOLR
40.00 mg | Freq: Once | INTRAVENOUS | Status: AC
Start: 2012-02-01 — End: 2012-02-01
  Administered 2012-02-01: 40 mg via INTRAVENOUS
  Filled 2012-02-01: qty 40

## 2012-02-01 MED ORDER — ASPIRIN EC 81 MG PO TBEC
81.0000 mg | DELAYED_RELEASE_TABLET | Freq: Every day | ORAL | Status: DC
Start: 2012-02-02 — End: 2012-02-02
  Administered 2012-02-02: 81 mg via ORAL
  Filled 2012-02-01: qty 1

## 2012-02-01 MED ORDER — METFORMIN HCL 500 MG PO TABS
2000.0000 mg | ORAL_TABLET | Freq: Every day | ORAL | Status: DC
Start: 2012-02-01 — End: 2012-02-01

## 2012-02-01 MED ORDER — OXYCODONE-ACETAMINOPHEN 5-325 MG PO TABS
1.0000 | ORAL_TABLET | Freq: Four times a day (QID) | ORAL | Status: DC | PRN
Start: 2012-02-01 — End: 2012-02-02
  Administered 2012-02-01 – 2012-02-02 (×3): 1 via ORAL
  Filled 2012-02-01 (×3): qty 1

## 2012-02-01 MED ORDER — NITROGLYCERIN 0.4 MG SL SUBL
0.40 mg | SUBLINGUAL_TABLET | Freq: Once | SUBLINGUAL | Status: AC
Start: 2012-02-01 — End: 2012-02-01
  Administered 2012-02-01: 0.4 mg via SUBLINGUAL

## 2012-02-01 MED ORDER — HEPARIN SODIUM (PORCINE) 5000 UNIT/ML IJ SOLN
5000.0000 [IU] | Freq: Three times a day (TID) | INTRAMUSCULAR | Status: DC
Start: 2012-02-01 — End: 2012-02-02
  Administered 2012-02-01 – 2012-02-02 (×3): 5000 [IU] via SUBCUTANEOUS
  Filled 2012-02-01 (×3): qty 1

## 2012-02-01 MED ORDER — ONDANSETRON HCL 4 MG/2ML IJ SOLN
4.00 mg | Freq: Once | INTRAMUSCULAR | Status: AC
Start: 2012-02-01 — End: 2012-02-01
  Administered 2012-02-01: 4 mg via INTRAVENOUS
  Filled 2012-02-01: qty 2

## 2012-02-01 MED ORDER — LIDOCAINE VISCOUS 2 % MT SOLN
10.00 mL | Freq: Once | OROMUCOSAL | Status: AC
Start: 2012-02-01 — End: 2012-02-01
  Administered 2012-02-01: 10 mL via OROMUCOSAL
  Filled 2012-02-01: qty 15

## 2012-02-01 MED ORDER — METOPROLOL TARTRATE 25 MG PO TABS
12.5000 mg | ORAL_TABLET | Freq: Two times a day (BID) | ORAL | Status: DC
Start: 2012-02-01 — End: 2012-02-02
  Administered 2012-02-01 – 2012-02-02 (×3): 12.5 mg via ORAL
  Filled 2012-02-01 (×3): qty 1

## 2012-02-01 MED ORDER — METFORMIN HCL ER 500 MG PO TB24
1000.0000 mg | ORAL_TABLET | Freq: Two times a day (BID) | ORAL | Status: DC
Start: 2012-02-01 — End: 2012-02-01

## 2012-02-01 MED ORDER — METFORMIN HCL 500 MG PO TABS
1000.0000 mg | ORAL_TABLET | Freq: Two times a day (BID) | ORAL | Status: DC
Start: 2012-02-01 — End: 2012-02-02
  Administered 2012-02-01 – 2012-02-02 (×2): 1000 mg via ORAL
  Filled 2012-02-01 (×2): qty 2

## 2012-02-01 MED ORDER — VITAMIN D 1000 UNITS PO TABS
1000.0000 [IU] | ORAL_TABLET | Freq: Every day | ORAL | Status: DC
Start: 2012-02-01 — End: 2012-02-01
  Filled 2012-02-01 (×2): qty 1

## 2012-02-01 MED ORDER — PRAVASTATIN SODIUM 40 MG PO TABS
40.0000 mg | ORAL_TABLET | Freq: Every day | ORAL | Status: DC
Start: 2012-02-01 — End: 2012-02-02
  Administered 2012-02-01: 40 mg via ORAL
  Filled 2012-02-01: qty 1

## 2012-02-01 MED ORDER — VITAMIN D 1000 UNITS PO TABS
1000.0000 [IU] | ORAL_TABLET | Freq: Every day | ORAL | Status: DC
Start: 2012-02-01 — End: 2012-02-02
  Administered 2012-02-01 – 2012-02-02 (×2): 1000 [IU] via ORAL
  Filled 2012-02-01 (×2): qty 1

## 2012-02-01 MED ORDER — ASPIRIN 81 MG PO CHEW
324.00 mg | CHEWABLE_TABLET | Freq: Once | ORAL | Status: AC
Start: 2012-02-01 — End: 2012-02-01
  Administered 2012-02-01: 324 mg via ORAL
  Filled 2012-02-01: qty 4

## 2012-02-01 NOTE — ED Provider Notes (Signed)
I have reviewed the ED nursing notes and prior records. I have reviewed the patient's past medical history/problem list, allergies, social history and medication list.  I saw this patient primarily.    History is obtained from the patient as well as EMS.    HPI:  This 56 year old female patient with a history of diabetes, hypertension, hyperlipidemia, anxiety, and tobacco dependence, who presents with burning to sharp epigastric and substernal chest pain which awoke her from sleep about an hour and a half prior to arrival in emergency department.  She also endorses shortness of breath.  She states that she last had similar symptoms and she was diagnosed with gastritis/hiatal hernia (which review of patient's records shows occurred at 2008 by EGD).  The pain is nonradiating.  It has been constant.  It is severe.  It is associated with nausea without vomiting. The patient denies any associated dizziness, diaphoresis, fever, cough, hemoptysis, leg swelling, recent immobilization, travel, surgery, use of exogenous estrogen, history of blood clot, or history of malignancy.  Of note, in 2010 the pt had a Normal myocardial perfusion SPECT image study..      ROS: Pertinent positives were reviewed as per the HPI above. All other systems were reviewed and are negative.    Past Medical History/Problem List:    Past Medical History    Back pain     AMI NOS, unspecified     Diabetes     Disorders of lipoid metabolism     HTN (hypertension)     Esophageal reflux     Arthritis     Hiatal hernia        There is no problem list on file for this patient.      Past Surgical History:    Past Surgical History    LAPAROSCOPY SURG CHOLECYSTECTOMY     OB ANTEPARTUM CARE CESAREAN DLVR & POSTPARTUM     ANES NERVE MUSC TENDON FASCIA&BURSA KNEE&/POPLT     TONSILLECTOMY & ADENOIDECTOMY           Medications:     No current facility-administered medications on file prior to encounter.  Current outpatient prescriptions ordered prior to  encounter:  LISINOPRIL OR  Disp:  Rfl:    VITAMIN D OR  Disp:  Rfl:    METFORMIN HCL OR None Entered Disp:  Rfl:    OMEPRAZOLE OR None Entered Disp:  Rfl:    EFFEXOR 75 MG OR TABS None Entered Disp:  Rfl:    SIMVASTATIN OR None Entered Disp:  Rfl:    ibuprofen (ADVIL,MOTRIN) 600 MG tablet Take 1 tablet by mouth every 6 (six) hours as needed for Pain. Disp: 30 tablet Rfl: 0   PERCOCET OR None Entered Disp:  Rfl:    SOMA OR None Entered Disp:  Rfl:    PROTONIX OR daily Disp:  Rfl:          Social History:     Smoking status: Current Everyday Smoker  0.5 Packs/Day     Smokeless tobacco:     Alcohol Use: No       Allergies:  Review of Patient's Allergies indicates:   Codeine camsylate          Bactrim                 Itching    Physical Exam:  BP 138/79   Pulse 101   Temp 97.7 F   Resp 18   Wt 113.399 kg (250 lb)  SpO2 99%   LMP 10/22/2007  GENERAL: Uncomfortable appearing, mild psychiatric distress, conversant in full sentences  SKIN:  Warm & Dry, no rash, no bruising.  HEAD:  Atraumatic. PERRL. Anicteric sclera. Oropharynx clear.  NECK:  Supple with full painless ROM at the neck  LUNGS:  Clear to auscultation bilaterally without rales, rhonchi or wheezing.  No tachypnea respiratory distress the  HEART: Slightly tachycardic at 101 without obvious murmur  ABDOMEN: Obese.  Mild epigastric discomfort to palpation without rebound or guarding  MUSCULOSKELETAL:  No deformities. Well-perfused extremities with  2+ DP/PT/Rad pulses bilaterally. No cyanosis or edema.  GENITOURINARY:  No CVA tenderness.   NEUROLOGIC:  Normal speech.  alert & oriented x 3, CNsII-XII grossly intact. Gait normal.  PSYCHIATRIC: Anxious affect    DIAGNOSTICS:    Admission on 02/01/2012  TROPONIN I (ng/mL) Date: 02/01/2012   Value: 0.01  Low: 0.00 High: 0.04 Status: Final   PROTHROMBIN TIME (SECONDS) Date: 02/01/2012   Value: 10.4  Low: 9.5 High: 11.5 Status: Final   INR  Date: 02/01/2012   Value: < 1.0* Low: 2.0 High: 3.5 Status: Final   APTT  (SECONDS) Date: 02/01/2012   Value: 27.7  Low: 23.7 High: 33.3 Status: Final   SODIUM (mmol/L) Date: 02/01/2012   Value: 138  Low: 135 High: 144 Status: Final   POTASSIUM (mmol/L) Date: 02/01/2012   Value: 4.2  Low: 3.5 High: 5.1 Status: Final   CHLORIDE (mmol/L) Date: 02/01/2012   Value: 104  Low: 101 High: 111 Status: Final   CARBON DIOXIDE (mmol/L) Date: 02/01/2012   Value: 26  Low: 22 High: 32 Status: Final   ANION GAP (mmol/L) Date: 02/01/2012   Value: 8  Low: 3 High: 11 Status: Final   CALCIUM (mg/dl) Date: 51/88/4166   Value: 9.1  Low: 8.6 High: 10.3 Status: Final   Glucose Random (mg/dl) Date: 05/18/1600   Value: 103  Low: 74 High: 160 Status: Final   BLOOD UREA NITROGEN (mg/dl) Date: 09/32/3557   Value: 11  Low: 6 High: 20 Status: Final   TOTAL PROTEIN (g/dl) Date: 32/20/2542   Value: 6.3  Low: 5.9 High: 7.5 Status: Final   ALBUMIN (g/dl) Date: 70/62/3762   Value: 3.6  Low: 3.4 High: 4.8 Status: Final   BILIRUBIN TOTAL (mg/dl) Date: 83/15/1761   Value: 0.3  Low: 0.2 High: 1.1 Status: Final   ALKALINE PHOSPHATASE (IU/L) Date: 02/01/2012   Value: 66  Low: 25 High: 106 Status: Final   ASPARTATE AMINOTRANSFERAS (IU/L) Date: 02/01/2012   Value: 18  Low: 8 High: 34 Status: Final   CREATININE (mg/dl) Date: 60/73/7106   Value: 0.6  Low: 0.4 High: 1.2 Status: Final   ESTIMATED GLOMERULAR FILT RATE (ML/MIN) Date: 02/01/2012   Value: > 60  Low: > 60 High:  Status: Final   ALANINE AMINOTRANSFERASE (IU/L) Date: 02/01/2012   Value: 25  Low: 7 High: 35 Status: Final   LIPASE (U/L) Date: 02/01/2012   Value: 47  Low: 10 High: 50 Status: Final   WHITE BLOOD CELL (TH/uL) Date: 02/01/2012   Value: 11.1* Low: 4.0 High: 11.0 Status: Final   RED BLOOD CELL COUNT (M/uL) Date: 02/01/2012   Value: 4.25  Low: 3.90 High: 5.20 Status: Final   HEMOGLOBIN (g/dL) Date: 26/94/8546   Value: 12.6  Low: 11.2 High: 15.7 Status: Final   HEMATOCRIT (%) Date: 02/01/2012   Value: 38.6  Low: 34.1 High: 44.9 Status: Final   MEAN CORPUSCULAR VOL (fL)  Date: 02/01/2012   Value: 90.8  Low: 80.0 High: 100.0 Status: Final   MEAN CORPUSCULAR HGB (pg) Date: 02/01/2012   Value: 29.6  Low: 26.0 High: 34.0 Status: Final   MEAN CORP HGB CONC (g/dL) Date: 16/08/9603   Value: 32.6  Low: 31.0 High: 37.0 Status: Final   RBC DISTRIBUTION WIDTH STD (fL) Date: 02/01/2012   Value: 48.2* Low: 35.1 High: 46.3 Status: Final   RBC DISTRIBUTION WIDTH (%) Date: 02/01/2012   Value: 14.8* Low: 11.5 High: 14.3 Status: Final   PLATELET COUNT (TH/uL) Date: 02/01/2012   Value: 283  Low: 150 High: 400 Status: Final   MEAN PLATELET VOLUME (fL) Date: 02/01/2012   Value: 10.6  Low: 8.7 High: 12.5 Status: Final     ECG on my reading: NSR, nl axis/intervals, no evidence of ischemia, no STEMI  CXR on my reading: nl mediastinum, no ptx, no infiltrate, no cardiomegaly.    ED Course and Medical Decision-making:    The patient is 56 year old female with burning and sharp epigastric pain, onset 1.5 hours prior to her arrival in the emergency department.  It is associated with shortness of breath and nausea.  She states that feels similar to when the patient was diagnosed with gastritis/hiatal hernia.  However, the patient does have multiple cardiac risk factors including possible prior history of MI, diabetes, hypertension, hyperlipidemia, and tobacco dependence.  She also appeared quite uncomfortable on arrival, with shortness of breath and nausea.  Given these issues, I do think that the most prudent course of action is admission to the hospital for cardiac rule out.  Initial electrocardiogram and troponin were nonischemic.  Chest x-ray did not show pneumonia or pneumothorax.  I do not suspect aortic dissection or pulmonary embolism.  Other blood work was essentially unremarkable except for slight leukocytosis of 11.1.  The patient will be admitted to telemetry monitoring.  Medication list and nursing note reviewed. Prior records reviewed. The patient agrees with this plan and  disposition.    Disposition: admission to telemetry    Condition on  Admission:  Improved and Stable    Diagnosis/Diagnoses:  789.06E Epigastric pain  Chest pain  786.05E SOB (shortness of breath)  Rule out MI    Critical care time outside of procedures: 0    Warren Lacy MD

## 2012-02-01 NOTE — ED Notes (Signed)
Medicated with aspirin as ordered, seen by Hospitalist, placed on oxygen, D- Dimer added to labs

## 2012-02-01 NOTE — ED Triage Note (Signed)
Per pt " Awoke this am feeling SOB with chest pain x 1 hour and 20 minutes. "  EMS gave 324 ASA. #18 HL LAC in place. Blood sugar 101. Rates pain 6/10. Pain inc with resp and palp.

## 2012-02-01 NOTE — ED Notes (Signed)
Medicated with nitro at 0754 brings her chest pain from 4 to 3/10, nitro given at 0800, brings her pain to 2/10, then morphine given as ordered

## 2012-02-01 NOTE — ED Notes (Signed)
Pt to the floor

## 2012-02-01 NOTE — ED Notes (Signed)
Report called to W1

## 2012-02-01 NOTE — Progress Notes (Addendum)
Admitted for chest pain, difficulty breathing during the night. Pt relates hx of difficulty sleeping and episodes of what sounds like sleep apnea. Pt also states tenderness to palpation in mid chest, states she has had "a bad cough" this week. Med for chronic back pain. Pt resting after pain med.

## 2012-02-01 NOTE — Progress Notes (Addendum)
Pt is Alert and Oriented x 3, very cooperative, She denies having any HA, SOB, dizziness, chest pain at this time. She continues on oxygen 2 L via NC.   Waiting for pharmacy to release extended release Effexor and Metformin. Medications were updated and reviewed by pharmacy.  Left arm peripheral line intact with no s/s of infection noted.  She continues on tele 2280 with NSR.   She received Percocet x 1 for low back pain with good relief. Pt is independent with all ADLs.   Pt is resting well.

## 2012-02-01 NOTE — Progress Notes (Signed)
Meeting with patient who states that she lives at home in [place] with her adult son.  Patient describes independence with ADL's and ambulation without device. She denies falls.  She states that her sons assist with IADL's as needed.  Patient does not drive, but she uses the [employer] for transportation.  Patient states that she has never had any type of home based community services. She has never been to short term rehabilitation.  Patient states that she feels safe at home and she plan on returning when medically stable.  Plan: No CM needs identified at this time.  Anticipate discharge home with outpatient follow up when medically stable.  Patient is in agreement with plan of discharge and denies any further questions and/or concerns re: plan of discharge at this time.

## 2012-02-01 NOTE — H&P (Signed)
H&P NOTE     Chief Complaint:   Patient presents with:    Chest Pain - CHEST PAIN      History of Present Illness:    56 y/o female with PMH significant for DM2, HTN, GERD, chronic bronchitis, Hiatal hernia, spinal stenosis with chronic leg and back pain presented with sudden onset of sternal chest pain that woke her up from sleep this morning.     She was in her usual state of health until 5-6 days ago when she started having cough, nausea, vomiting and diarrhea. She had felt hot and cold at that time but did not measure her temperature. She thus does not know if she had a fever.   These symptoms lasted for about 3-4 days. She saw her PCP 2 days ago and was prescribed Doxycycline BID and she has taken 3 doses so far.   Her diarrhea, vomiting and cough has now resolved x 1 day but still feels mildly nauseous. There was no blood in vomitus or stool.   She was feeling well when she went to bed last night. She woke up in morning with 10/10 non radiating sharp sternal pain associated with shortness of breath. She had similar pain about 3 years ago when she underwent EGD and was diagnosed with hiatal hernia. She has been taking omeprazole regularly.     She drank some cold water and sit up in bed with 3 pillows. It did not help her much so she decided to come to the hospital. Her pain improved after she received medication in ED.     She denies any unusual foods, spicy food, excessive tea, coffee. She does not drink alcohol.     Review of Systems:  No headache, dizziness, blurring of vision, syncopal events, palpitations, orthopnea, PND, epistaxis, hemoptysis, hematemesis. No swelling of legs or weakness, tingling or upper or lower extremities. Positive pertinent listed above in HPI.  All other systems were reviewed and were negative.      Exercise capacity: She can walk up to 4 blocks at a time. Thereafter she has to stop secondary to leg pain and feeling tired.     Past Medical History:     Past Medical History     Back pain     Diabetes     Disorders of lipoid metabolism     HTN (hypertension)     Esophageal reflux     Arthritis     Chronic bronchitis          Past Surgical History:       Past Surgical History    LAPAROSCOPY SURG CHOLECYSTECTOMY     OB ANTEPARTUM CARE CESAREAN DLVR & POSTPARTUM     ANES NERVE MUSC TENDON FASCIA&BURSA KNEE&/POPLT     TONSILLECTOMY & ADENOIDECTOMY      TOTAL KNEE REPLACEMENT     Comment left         Medications prior to Admission:     Prescriptions prior to admission:  aspirin 81 MG tablet Take 81 mg by mouth daily. Disp:  Rfl:    venlafaxine (EFFEXOR) 75 MG tablet Take 75 mg by mouth daily. Disp:  Rfl:    LISINOPRIL OR Take 20 mg by mouth daily. Disp:  Rfl:    VITAMIN D OR Take 1,000 Int'l Units by mouth daily. Disp:  Rfl:    METFORMIN HCL OR 2000 mg po daily with dinner Disp:  Rfl:    OMEPRAZOLE OR 20 mg po daily Disp:  Rfl:  SIMVASTATIN OR 40 mg po QHS Disp:  Rfl:    ibuprofen (ADVIL,MOTRIN) 600 MG tablet Take 1 tablet by mouth every 6 (six) hours as needed for Pain. Disp: 30 tablet Rfl: 0   PERCOCET OR 5/325 mg . 1 tab po every 6- 8 hours as needed Disp:  Rfl:          Allergies:   Review of Patient's Allergies indicates:   Codeine camsylate          Bactrim                 Itching    Social History:  Tobacco Use:   Smoking status: Current Everyday Smoker 0.5 Packs/Day     Has been smoking since her 6s.    Alcohol:   Alcohol Use: No     Lives at home with son, Barbra Sarks - [phone]    Family History:    Mother dies of Heart  Failure in 4 - she was about 70 years. She also had DM. Father- HTN. Brother- DM.    Physical Exam:    97.7 F (36.5 C) 101 18 138/79 mmHg 99 %  100 16 111/53 mmHg ! 89 %    General:  Patient comfortable, no distress    Head: Atraumatic, normocephalic    EYES: EOMI, PERRLA    ENT: Mucus membranes- moist    RS: Fair air entry with occasional rhonchi. + tenderness over the sternal area. No swelling or rash.    CVS: S1S2 regular, no murmur. No JVD, no leg  edema  GI:  Abdomen - Obese soft, non tender, BS +    Neurological:  AAOX 3. Moving all extremities    Musculo skeletal: Neck supple, extremities- atraumatic. Scar on left knee from TKR. No calf tenderness.    Skin: no rash.       Intake/Output last 24hours (7a-7a):   I/O 24 Hrs:  In: 480 [P.O.:480]  Out: -     Vital Signs - Last 24 Hours:   BP: (100-138)/(44-99)   Temp:  [97.4 F (36.3 C)-97.8 F (36.6 C)]   Pulse:  [83-105]   Resp:  [12-19]   SpO2:  [86 %-99 %]     Vital Signs - Last 8 Hours:   BP: (103-130)/(65-99)   Temp:  [97.4 F (36.3 C)-97.8 F (36.6 C)]   Pulse:  [83-90]   Resp:  [16-18]   SpO2:  [98 %]     Recent Labs:  CBC:   Component Value Date   WBC 11.1* 02/01/2012   HGB 12.6 02/01/2012   HCT 38.6 02/01/2012   PLTA 283 02/01/2012   RBC 4.25 02/01/2012     BMP:   Component Value Date   NA 138 02/01/2012   K 4.2 02/01/2012   CL 104 02/01/2012   CO2 26 02/01/2012   BUN 11 02/01/2012   CREAT 0.6 02/01/2012   GLUCOSER 103 02/01/2012   CA 9.1 02/01/2012     LFTs:   Component Value Date   AST 18 02/01/2012   ALT 25 02/01/2012   TBILI 0.3 02/01/2012   ALKPHOS 66 02/01/2012         Component Value Date   PT 10.4 02/01/2012   INR < 1.0* 02/01/2012   APTT 27.7 02/01/2012       Component Value Date        TROPI < 0.01 02/01/2012       No results found for this basename: BNP  Imaging:       CHEST X RAY:    No acute cardiopulmonary disease.    EKG : My read:  Borderline tachycardia at 100 bpm. No acute ST/ T changes noticed. Normal axis.     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   In the ED, found to be hypoxic with tachycardia. A  d-dimer was requested that was negative.    1. Chest pain- The pain is reproducible and likely muscular-skeletal from coughing and vomiting.                          There may be a component of GERD as well.(h/o hiatal hernia)                          Possible pill- esophagitis as taking Doxycycline x 2 days.-                          Cont PPI, d/c Doxycycline.                          She will, however , be ruled out for ACS.                         TIMI score is 2.                         She will be continued on ASA, statin.                          Given relative tachycardia- will start on beta blocker as well. No acute bronchospasm despite hypoxia. , so no contraindication.                          2. Hypoxia-        Unclear etiology. She has extensive smoking history and 'chronic bronchitis'. She uses pro-air as needed at home.                              Will start on Combivent. Give supplement O2. Recommend outpatient PFTs.                             Given habitus and her admission that she woke up from sleep as if she had stopped breathing; I also suspect OSA. I have spoken with                             her PCP s PA to consider outpatient PFTs and Sleep studies.  D-dimer neg. No leg swelling- doubt PE.                           Will check ECHO to assess for Pulm HTN.  3. DM2 - controlled. Continue Metformin.    4. Chronic back/ leg pain- cont Percocet    5. HTN- currently blood pressure is borderline low. Will hold Lisinopril. Started on beta blocker for tachycardia.                               Doubt dehydration as cause for tachycardia as BUN/Cr normal. More likely from hypoxia.                       Heparin for DVT prophylaxis.      Spoke with PA Carley Hammed at PCP [facility]'s office.       Humphrey Rolls, MD  Pager Number: [phone].

## 2012-02-02 LAB — EKG

## 2012-02-02 MED ORDER — CHOLECALCIFEROL 25 MCG (1000 UT) PO TABS
1000.0000 [IU] | ORAL_TABLET | Freq: Every day | ORAL | Status: DC
Start: 2012-02-02 — End: 2015-06-09

## 2012-02-02 MED ORDER — IPRATROPIUM-ALBUTEROL 18-103 MCG/ACT IN AERO
2.0000 | INHALATION_SPRAY | Freq: Four times a day (QID) | RESPIRATORY_TRACT | Status: DC
Start: 2012-02-02 — End: 2015-06-09

## 2012-02-02 NOTE — Progress Notes (Signed)
Up to showers c steady gait.  Denies pain presently.  Sats 97% r/a.    Tol diet.  Seems stable.  ? D/c [plan.

## 2012-02-02 NOTE — Discharge Summary (Signed)
Physician Discharge Summary     Patient ID:  Samantha Cameron  [id number]  56 year old  29-Oct-1956    Admit date: 02/01/12    Discharge date and time: 02/02/12    Admitting Physician: Valorie Roosevelt     Discharge Physician: Rennis Chris    Discharge Diagnoses: atypical chest pain     Admission Condition: fair    Discharged Condition: good    Indication for Admission: chest pain     Hospital Course: pt was admitted with chest pain , it was sharp , no associated with activity , she had similar episode of chest pain 3 year ago , at that time was found to have hiatal hernia , she this time did not have any ischemic EKG  And ruled out for mi by serial cpk .     Consults: non     Significant Diagnostic Studies: rule out protocol , chest x ray     Treatments:     Discharge Exam:    Alert , oriented   Neck is supple   cvs normal first and second sound no added sound   Lungs are clear   Abdomen is benign   Extremities no edema   Neuro non focal         Disposition: Home    Patient Instructions:   Current Discharge Medication List    START taking these medications    albuterol-ipratropium (COMBIVENT) 18-103 MCG/ACT inhaler  Inhale 2 puffs into the lungs 4 (four) times daily.  Qty: 1 Inhaler Refills: 1    !! cholecalciferol (VITAMIN D3) 1000 UNIT tablet  Take 1 tablet by mouth daily.  Qty: 30 tablet Refills: 1    !! - Potential duplicate medications found. Please discuss with provider.      CONTINUE these medications which have NOT CHANGED    aspirin 81 MG tablet  Take 81 mg by mouth daily.    venlafaxine (EFFEXOR) 75 MG tablet  Take 75 mg by mouth daily.    LISINOPRIL OR  Take 20 mg by mouth daily.    !! VITAMIN D OR  Take 1,000 Int'l Units by mouth daily.    METFORMIN HCL OR  2000 mg po daily with dinner    OMEPRAZOLE OR  20 mg po daily    SIMVASTATIN OR  40 mg po QHS    PERCOCET OR  5/325 mg . 1 tab po every 6- 8 hours as needed    !! - Potential duplicate medications found. Please discuss with provider.      STOP taking these  medications    ibuprofen (ADVIL,MOTRIN) 600 MG tablet        Activity: no heavy work till you see your cardiologist on Monday   Diet: diabetic   Wound Care: non     [place] Future Appointments:  Current and Future Appointments at [facility] (90 Days)    Date Time Visit Type Department Provider Length    None.            Follow-up with:  Follow-up Information    Follow up With Details Comments Contact Info    Ivin Booty   [address], [address]  [place] [address]  [phone]          Other Follow-up:  With cardiology at [facility] on Monday the 02/04/12    Signed:  Rennis Chris, MD  02/02/2012  11:15 AM

## 2012-02-02 NOTE — Discharge Instructions (Signed)
Your came due to chest pain , you did not have heart attack . You will have a stress test on Monday. Do not use heavy work till see cardiologist , You should not have any coffee or caffinated drink for 24 hours .

## 2012-02-02 NOTE — Progress Notes (Signed)
C/o abd pain not time yet for percocet, pt had percocet at 0310 offered to call MD pt refused stated she will wait for the time.

## 2012-02-06 ENCOUNTER — Ambulatory Visit (HOSPITAL_BASED_OUTPATIENT_CLINIC_OR_DEPARTMENT_OTHER): Payer: MEDICARE | Admitting: Internal Medicine

## 2012-02-15 ENCOUNTER — Telehealth (HOSPITAL_BASED_OUTPATIENT_CLINIC_OR_DEPARTMENT_OTHER): Payer: Self-pay

## 2012-02-15 NOTE — Telephone Encounter (Signed)
Patient was contacted to confirm appointment for Apr 2 @ 11:15am with Dr. Biagio Borg.  I spoke with the patient directly who confirmed they were planning on keeping the appointment.  Directions to the office reviewed.   I asked them to bring a medication list or all their medication bottles so we can confirm an accurate list in their medical record.  No questions at this time.  I asked that they call the office if they decide not to come and we would be happy to reschedule the appointment.

## 2012-02-19 ENCOUNTER — Ambulatory Visit (HOSPITAL_BASED_OUTPATIENT_CLINIC_OR_DEPARTMENT_OTHER): Payer: MEDICARE | Admitting: Internal Medicine

## 2012-02-19 VITALS — BP 123/76 | HR 110 | Temp 98.3°F | Wt 250.0 lb

## 2012-02-19 DIAGNOSIS — R079 Chest pain, unspecified: Secondary | ICD-10-CM

## 2012-02-19 DIAGNOSIS — Z720 Tobacco use: Secondary | ICD-10-CM

## 2012-02-19 DIAGNOSIS — R000 Tachycardia, unspecified: Secondary | ICD-10-CM

## 2012-02-19 MED ORDER — NICOTINE 7 MG/24HR TD PT24
1.00 | MEDICATED_PATCH | TRANSDERMAL | Status: AC
Start: 2012-02-19 — End: 2012-05-20

## 2012-02-19 NOTE — Progress Notes (Signed)
.    Pt states she is not having any chest pain.

## 2012-02-22 NOTE — Progress Notes (Signed)
Date of Service: 02/19/2012    Dear Dr. Sharma Covert:    I am seeing Samantha Cameron after her recent hospitalization for chest pain.  It was my understanding from reading the note that the chest pain was deemed to be noncardiac, most likely due to gastroesophageal reflux.    Since discharge from the hospital, Samantha Cameron denies any recurrent episodes of chest pain.  She has shortness of breath with exertion.  She also endorses palpitations and a sensation of heart racing almost daily.  She denies PND, orthopnea, lightheadedness, syncopal episodes.  She acknowledges that she stops breathing at night and she snores.    All other systems were reviewed and are positive for chronic back pain due to spinal stenosis.  Unfortunately, she continues to smoke, but she claims she only does 3 cigarettes a day since the hospital discharge.  All other systems are reviewed and negative.    PAST MEDICAL HISTORY:  Includes diabetes mellitus for 4 years, hypertension, morbid obesity, hyperlipidemia, possible sleep apnea, chronic obstructive pulmonary disease,  myocardial perfusion test of March 2010 was normal without any evidence of ischemia.    SOCIAL HISTORY:  She is not married.  She has 3 children.  Continues to smoke and she did so since age 25.  She denies alcohol or drug use.    FAMILY HISTORY:  Father is 73 years old, has hypertension, arthritis, and prostate cancer.  Mother deceased at age 38 of congestive heart failure and she was a diabetic.    ALLERGIES:  Include Motrin, codeine, Bactrim.    CURRENT MEDICATIONS:  Include lisinopril 20 mg daily, vitamin D, aspirin 81 mg daily, Effexor 75 mg daily, simvastatin 40 mg at night, metformin 2000 mg, omeprazole extended release 20 mg daily, albuterol, ipratropium inhaler.    PHYSICAL EXAMINATION:  GENERAL:  Middle-aged female in no acute distress.  VITAL SIGNS:  Blood pressure 122/76; pulse 110; oxygen saturation 96% on room air; weight 250 pounds, which is a BMI of 45.  HEENT:  Shows moist mucosal  membranes, anicteric sclerae, extraocular movements intact.  NECK:  Supple, without JVD or carotid bruits.  LUNGS:  Clear to auscultation bilaterally.  CARDIOVASCULAR:  Normal S1, S2, tachycardia, and no murmurs.  ABDOMEN:  Obese, but soft, nontender, nondistended.  EXTREMITIES:  Free of edema.   SKIN:  No skin changes are noted.  NEUROLOGIC:  No obvious neurologic deficit is present.    I have reviewed the 12-lead electrocardiogram, which was done during the hospitalization and it shows sinus tachycardia at 101 beats per minute.  Delayed RS transition in lead V5.  No evidence of infarct or ischemia to my review.  Compared to prior electrocardiogram, no significant change.    ASSESSMENT AND PLAN:  In summary, Samantha Cameron is a 56 year old female who has multiple cardiac risk factors including ongoing tobacco abuse, diabetes mellitus, morbid obesity, and hypertension.  She was recently seen in the Emergency Department for an atypical episode of chest pain, likely gastrointestinal in etiology.     For cardiac risk assessment and for evaluation of dyspnea on exertion, a modified treadmill stress echocardiogram will be ordered.  Should she not be able to reach her target heart rate, we will convert to dobutamine stress echocardiogram.    She has sinus tachycardia.A basic workup including a TSH  and CBC shoudl be done for sinus tachycardia.  It could be that she is tachycardic due to the albuterol inhaler.  I would prefer not to start her on beta blocker at this  time, since I do not have compelling indication for it.    I spent 50% of the visit time counseling smoking cessation.  I sent in a prescription for a nicotine patch to be used.  I urged her not to use a nicotine patch and smoke at the same time since she can get intoxication with nicotine.    I will see [name] back in my clinic in 6 weeks to go over the test results.    Sincerely,    ___________________________  Reviewed and Electronically Signed By: Rolm Baptise  MD  Sig Date: 02/22/2012  Sig Time: 14:02:54  Dictated By: Rolm Baptise MD  Dict Date: 02/19/2012 Dict Time: 12 18 PM    Dictation Date and Time:02/19/2012 12:18:28  Transcription Date and Time:02/19/2012 13:18:29  eScription Dictation id: [id number] Confirmation # :[id number]      cc: Apolinar Junes.D.

## 2012-03-07 ENCOUNTER — Ambulatory Visit
Admit: 2012-03-07 | Discharge: 2012-03-07 | Disposition: A | Discharge status: Home / Self Care | Payer: Self-pay | Source: Ambulatory Visit | Attending: Internal Medicine | Admitting: Internal Medicine

## 2012-03-07 ENCOUNTER — Other Ambulatory Visit (HOSPITAL_BASED_OUTPATIENT_CLINIC_OR_DEPARTMENT_OTHER): Payer: Self-pay | Admitting: Internal Medicine

## 2012-03-07 DIAGNOSIS — R002 Palpitations: Secondary | ICD-10-CM

## 2012-03-07 DIAGNOSIS — R079 Chest pain, unspecified: Secondary | ICD-10-CM

## 2012-03-07 DIAGNOSIS — I517 Cardiomegaly: Secondary | ICD-10-CM

## 2012-03-07 DIAGNOSIS — I498 Other specified cardiac arrhythmias: Secondary | ICD-10-CM

## 2012-03-07 LAB — CARDIAC STRESS TEST: ECHO (IMAGING)

## 2012-03-07 LAB — ECHOCARDIOGRAM W/ DOPPLER

## 2012-03-07 MED ORDER — METOPROLOL SUCCINATE ER 25 MG PO TB24
12.5000 mg | ORAL_TABLET | Freq: Every day | ORAL | Status: DC
Start: 2012-03-07 — End: 2015-07-15

## 2012-03-11 LAB — EKG-12 ** TO BE DONE BY EKG **

## 2012-05-19 ENCOUNTER — Emergency Department (HOSPITAL_BASED_OUTPATIENT_CLINIC_OR_DEPARTMENT_OTHER)
Admission: RE | Admit: 2012-05-19 | Disposition: A | Discharge status: Home / Self Care | Payer: Self-pay | Source: Emergency Department | Attending: Physician Assistant | Admitting: Physician Assistant

## 2012-05-19 ENCOUNTER — Encounter (HOSPITAL_BASED_OUTPATIENT_CLINIC_OR_DEPARTMENT_OTHER): Payer: Self-pay

## 2012-05-19 LAB — XR FOOT RIGHT MINIMUM 3 VIEWS

## 2012-05-19 LAB — XR ANKLE RIGHT MINIMUM 3 VIEWS

## 2012-05-19 NOTE — ED Triage Note (Signed)
Right foot pain - Old injury ? reinjury with a twist as walking down stairs.    Painful with weight bearing and at rest.   8/10  No rx at home.    No redness   + swelling per pt

## 2012-05-19 NOTE — Discharge Instructions (Signed)
You have been seen and treated today in the emergency department for symptoms of right foot and ankle pain.  X-rays were taken and there are no signs of any acute fracture dislocation.  You have been given a well padded foot wrapped and a postoperative shoe for your foot sprain.  You have been given an Aircast for your right ankle sprain.  You've also been provided with a cane to help with ambulation.  Please rest the affected foot him elevate above the level of the heart and eyes multiple times daily.  Take Tylenol for your aches and pains.  Please followup your primary care doctor in 2 days.  If there is any worsening signs or symptoms return to the emergency department sooner.

## 2012-05-19 NOTE — ED Notes (Signed)
Pt. To XRAY via w/c.

## 2012-05-19 NOTE — ED Notes (Signed)
Reviewed discharge with pt.

## 2012-05-19 NOTE — ED Notes (Signed)
Patient Disposition    Patient education for diagnosis, medications, activity, diet and follow-up.  Patient left ED 5:58 PM.  Patient rep received written instructions.  Interpreter to provide instructions: No    Discharged to: Discharged to home

## 2012-05-20 NOTE — ED Provider Notes (Signed)
The patient was seen primarily by me. ED nursing record was reviewed. Prior records as available electronically through the Epic record were reviewed.    HPI:    This 56 year old female patient presents ambulatory to the emergency department today with chief complaint of right foot and ankle pain.  Patient states that she had an old foot fracture.  States that coming down the steps yesterday she believes that she twisted the right ankle and foot.  States swelling but no bruising.  Increased pain with weightbearing.  Taking Tylenol at home for pain.  Has not elevated nor applied ice.  Denies all other associated symptoms.  Patient was able to inability to the emergency department this evening.  Otherwise well-appearing, alert and oriented x3 and in no acute distress.      ROS: Pertinent positives were reviewed as per the HPI above. All other systems were reviewed and are negative.      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  Current Outpatient Prescriptions on File Prior to Encounter:  metoprolol (TOPROL-XL) 25 MG 24 hr tablet Take 0.5 tablets by mouth daily. At bedtime- half pill Disp: 15 tablet Rfl: 12   carisoprodol (SOMA) 350 MG tablet Take 350 mg by mouth 4 (four) times daily as needed. Disp:  Rfl:    simvastatin (ZOCOR) 40 MG  tablet Take 40 mg by mouth nightly. Disp:  Rfl:    lidocaine (LIDODERM) 5 % patch Place 1 patch onto the skin daily. Disp:  Rfl:    fluticasone (FLONASE) 50 MCG/ACT nasal spray 1 spray by Each Nostril route daily. Disp:  Rfl:    albuterol (PROVENTIL HFA,VENTOLIN HFA) 108 (90 BASE) MCG/ACT inhaler Inhale 2 puffs into the lungs every 6 (six) hours as needed. Disp:  Rfl:    oxycodone-acetaminophen (PERCOCET) 5-325 MG per tablet Take 1 tablet by mouth every 4 (four) hours as needed. Disp:  Rfl:    nicotine (NICODERM CQ) 7 MG/24HR Place 1 patch onto the skin daily. One patch daily- take it off at bedtime Disp: 30 patch Rfl: 3   EXPIRED: albuterol-ipratropium (COMBIVENT) 18-103 MCG/ACT inhaler Inhale 2 puffs into the lungs 4 (four) times daily. Disp: 1 Inhaler Rfl: 1   EXPIRED: cholecalciferol (VITAMIN D3) 1000 UNIT tablet Take 1 tablet by mouth daily. Disp: 30 tablet Rfl: 1   aspirin 81 MG tablet Take 81 mg by mouth daily. Disp:  Rfl:    venlafaxine (EFFEXOR) 75 MG tablet Take 75 mg by mouth daily.  Disp:  Rfl:    LISINOPRIL OR Take 20 mg by mouth daily. Disp:  Rfl:    VITAMIN D OR Take 1,000 Int'l Units by mouth daily. Disp:  Rfl:    METFORMIN HCL OR 2000 mg po daily with dinner Disp:  Rfl:    OMEPRAZOLE OR 20 mg po daily Disp:  Rfl:    SIMVASTATIN OR 40 mg po QHS Disp:  Rfl:          Social History:   Social History   Marital Status: Divorced  Spouse Name: N/A    Years of Education: N/A  Number of Children: N/A     Occupational History  None on file     Social History Main Topics   Smoking status: Current Everyday Smoker  0.50 Packs/Day     Smokeless tobacco:     Alcohol Use: No    Drug Use: No    Sexually Active:      Other Topics Concern   None on file     Social History Narrative   None on file     Allergies:  Review of Patient's Allergies indicates:   Motrin (ibuprofen)      Nausea Only   Bactrim                 Itching   Codeine camsylate             Physical Exam:  BP 148/67   Pulse 125   Temp(Src) 97.6 F   Resp  24   Wt 113.399 kg (250 lb)   BMI 45.71 kg/m2   SpO2 100%   LMP 10/22/2007    GENERAL:  WDWN, no acute distress, non-toxic   SKIN:  Warm & Dry, no rash, no petechia.  LUNGS:  Clear to auscultation bilaterally. No wheezes, rales, rhonchi.   HEART:  RRR.  No murmurs, rubs, or gallops.   EXTREMITIES:  No obvious deformities.  Warm and well perfused.  Patient has mild soft tissue swelling of the right foot and ankle.  His tenderness to palpation to lateral malleolus and dorsum of right foot.  Range of motion within normal limits.  Pedal pulses strong 3/3.  Distal sensation intact.  NEUROLOGIC:  Alert and oriented x4; moves all extremities well; speaking in clear fluent sentences.  Slight antalgic gait but without ataxia; nonfocal. CNsII-XII symmetrical and intact. Sensation intact to light touch throughout. 5/5 strength globally.  PSYCHIATRIC:  Appropriate for age, time of day, and situation        ED Course and Medical Decision-making:  56 year old female with right ankle and foot pain after a ankle inversion.  Currently afebrile with otherwise normal vital signs.  X-rays of the right foot and ankle show no acute fracture or dislocation.  There are degenerative changes noticed.  Patient diagnosed with sprain of right ankle and foot.  She was given a padded foot wrap and an postoperative shoe for her foot pain.  She was placed into a Aircast for the ankle sprain.  Instructed to elevate above level of heart.  Eyes multiple times daily and take Tylenol for pain and inflammation.  Otherwise follow up with PCP within 2 days. Reasons to return to the ED were reviewed in detail. The patient agrees with this plan and disposition.      Condition on Discharge: Stable    Diagnosis/Diagnoses:  Right ankle sprain  (primary encounter diagnosis)  Right foot sprain    Donella Stade. Azyiah Bo, PA-C

## 2012-06-05 ENCOUNTER — Encounter (HOSPITAL_BASED_OUTPATIENT_CLINIC_OR_DEPARTMENT_OTHER): Payer: Self-pay

## 2012-06-05 ENCOUNTER — Emergency Department (HOSPITAL_BASED_OUTPATIENT_CLINIC_OR_DEPARTMENT_OTHER)
Admission: RE | Admit: 2012-06-05 | Disposition: A | Discharge status: Home / Self Care | Payer: Self-pay | Source: Emergency Department | Attending: Emergency Medicine | Admitting: Emergency Medicine

## 2012-06-05 MED ORDER — OXYCODONE-ACETAMINOPHEN 5-325 MG PO TABS
1.00 | ORAL_TABLET | Freq: Once | ORAL | Status: AC
Start: 2012-06-05 — End: 2012-06-05
  Administered 2012-06-05: 1 via ORAL
  Filled 2012-06-05: qty 1

## 2012-06-05 NOTE — Discharge Instructions (Signed)
Please follow up with your primary care provider to make an appointment. We do not provide long term narcotic prescriptions from the Emergency Department. Continue with the prescribed muscle relaxant from Dr. Sharma Covert and use warm compresses as needed. Read the reference on chronic back pain for more information. Return to the ED at any time for any new or worsening symptoms.

## 2012-06-05 NOTE — ED Notes (Signed)
Pt ready for discharge. RN reviewed discharge instructions,, s/s to report, and follow up care. Pt denies further needs and gives verbal understanding. Pt ambulated to exit without difficulty. Pt has friend taking pt home.

## 2012-06-05 NOTE — ED Triage Note (Signed)
Pt to ED with c/o back pain increasing in intensity over the last 3 days but worse this am when tried to get out of bed.  Pt with history of DJD and spinal stenosis, has not been following up regarding plan for surgery

## 2012-06-05 NOTE — ED Provider Notes (Signed)
ED nursing record was reviewed. Prior records as available electronically through the Epic record were reviewed.      HPI:    This 56 year old female patient presents to the Emergency Department with chief complaint of back pain. Patient states she has chronic back pain due to her spinal stenosis. She states it has been worse over the past 3 days, rates the pain as a 10/10, constant, stabbing, worse with movement. Patient states she takes a muscle relaxer prescribed to her by Dr. Sharma Covert that has not been working. She states she has also tried APAP and warm heat with no relief. She denies any increased activity or direct trauma.  She denies any headache, neck pain, chest pain, shortness of breath, abdominal pain, nausea, vomiting or urinary symptoms.      ROS: Pertinent positives were reviewed as per the HPI above. All other systems were reviewed and are negative.      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  Current Outpatient Prescriptions on File Prior to Encounter:  metoprolol (TOPROL-XL) 25 MG 24 hr tablet Take 0.5 tablets by mouth daily. At bedtime- half pill Disp: 15 tablet Rfl: 12   carisoprodol (SOMA) 350 MG tablet Take 350 mg by mouth 4 (four) times daily as needed. Disp:  Rfl:    simvastatin (ZOCOR) 40 MG tablet Take 40  mg by mouth nightly. Disp:  Rfl:    lidocaine (LIDODERM) 5 % patch Place 1 patch onto the skin daily. Disp:  Rfl:    fluticasone (FLONASE) 50 MCG/ACT nasal spray 1 spray by Each Nostril route daily. Disp:  Rfl:    albuterol (PROVENTIL HFA,VENTOLIN HFA) 108 (90 BASE) MCG/ACT inhaler Inhale 2 puffs into the lungs every 6 (six) hours as needed. Disp:  Rfl:    oxycodone-acetaminophen (PERCOCET) 5-325 MG per tablet Take 1 tablet by mouth every 4 (four) hours as needed. Disp:  Rfl:    EXPIRED: albuterol-ipratropium (COMBIVENT) 18-103 MCG/ACT inhaler Inhale 2 puffs into the lungs 4 (four) times daily. Disp: 1 Inhaler Rfl: 1   EXPIRED: cholecalciferol (VITAMIN D3) 1000 UNIT tablet Take 1 tablet by mouth daily. Disp: 30 tablet Rfl: 1   aspirin 81 MG tablet Take 81 mg by mouth daily. Disp:  Rfl:    venlafaxine (EFFEXOR) 75 MG tablet Take 75 mg by mouth daily. Disp:  Rfl:    LISINOPRIL OR Take 20 mg by mouth daily. Disp:  Rfl:    VITAMIN D  OR Take 1,000 Int'l Units by mouth daily. Disp:  Rfl:    METFORMIN HCL OR 2000 mg po daily with dinner Disp:  Rfl:    OMEPRAZOLE OR 20 mg po daily Disp:  Rfl:    SIMVASTATIN OR 40 mg po QHS Disp:  Rfl:          Social History:   Social History   Marital Status: Divorced  Spouse Name: N/A    Years of Education: N/A  Number of Children: N/A     Occupational History  None on file     Social History Main Topics   Smoking status: Current Everyday Smoker  0.50 Packs/Day     Smokeless tobacco:     Alcohol Use: No    Drug Use: No    Sexually Active:      Other Topics Concern   None on file     Social History Narrative   None on file           Allergies:  Review of Patient's Allergies indicates:   Motrin (ibuprofen)      Nausea Only   Bactrim                 Itching   Codeine camsylate             Physical Exam:  BP 110/67   Pulse 113   Temp(Src) 98.4 F   Resp 18   Wt 113.399 kg (250 lb)   BMI 45.71 kg/m2   SpO2 99%   LMP 10/22/2007    GENERAL: No acute distress, non-toxic.   SKIN:  Warm & Dry, no  rash.  HEAD:  NCAT. Sclerae are anicteric and aninjected  LUNGS:  Clear to auscultation bilaterally. No wheezes, rales, rhonchi.   HEART:  RRR.  No murmurs, rubs, or gallops.   EXTREMITIES:  Back: lumbar spinal tenderness as well as bilateral lumbar paraspinal tenderness. Unable to assess ROM at the hip joint due to pain, able to flex/extend at the knee and ankle joint.   NEUROLOGIC:  Alert and oriented x4; moves all extremities well; speaking in clear fluent sentences. Sensation intact to light touch throughout. 5/5 strength globally. Normal gait. Patella reflexes 2+  PSYCHIATRIC:  Appropriate for age, time of day, and situation        ED Course and Medical Decision-making:  This 56 year old female patient in today with exacerbation of her chronic back pain.  Patient states that she takes a muscle relaxer from Dr. Sharma Covert, I looked her up in the  Online Prescription monitoring program.  It appears that she also sees a pain specialist and is prescribed 120 oxycodone per month.  Her last prescription was at the end of May. I questioned her regarding this and she stated she no longer sees him due to a disagreement regarding a urine test, she stated she was negative for the oxycodone although she had been taking it.    Patient is tachycardic at 113 and I asked her if she felt she was dependent on these medications, and she stated she was. I told her that we did not provide oxycodone prescriptions from the ED for chronic pain.  told her to follow up with her PCP and her pain management specialist regarding the pain.         Condition: Stable  Disposition: Home      Diagnosis/Diagnoses: Chronic back pain     Foye Clock, PA-C

## 2012-11-19 ENCOUNTER — Encounter (HOSPITAL_BASED_OUTPATIENT_CLINIC_OR_DEPARTMENT_OTHER): Payer: Self-pay | Admitting: Emergency Medicine

## 2012-11-19 ENCOUNTER — Emergency Department (HOSPITAL_BASED_OUTPATIENT_CLINIC_OR_DEPARTMENT_OTHER)
Admission: RE | Admit: 2012-11-19 | Disposition: A | Discharge status: Home / Self Care | Payer: Self-pay | Source: Emergency Department | Attending: Emergency Medicine | Admitting: Emergency Medicine

## 2012-11-19 LAB — CBC, PLATELET & DIFFERENTIAL
ABSOLUTE BASO COUNT: 0 10*3/uL (ref 0.0–0.1)
ABSOLUTE EOSINOPHIL COUNT: 0.3 10*3/uL (ref 0.0–0.8)
ABSOLUTE IMM GRAN COUNT: 0.03 10*3/uL (ref 0.00–0.03)
ABSOLUTE LYMPH COUNT: 4 10*3/uL (ref 0.6–5.9)
ABSOLUTE MONO COUNT: 1.3 10*3/uL (ref 0.2–1.4)
ABSOLUTE NEUTROPHIL COUNT: 9.4 10*3/uL — ABNORMAL HIGH (ref 1.6–8.3)
BASOPHIL %: 0.3 % (ref 0.0–1.2)
EOSINOPHIL %: 2.1 % (ref 0.0–7.0)
HEMATOCRIT: 43.8 % (ref 34.1–44.9)
HEMOGLOBIN: 14.2 g/dL (ref 11.2–15.7)
IMMATURE GRANULOCYTE %: 0.2 % (ref 0.0–0.4)
LYMPHOCYTE %: 26.6 % (ref 15.0–54.0)
MEAN CORP HGB CONC: 32.4 g/dL (ref 31.0–37.0)
MEAN CORPUSCULAR HGB: 29.7 pg (ref 26.0–34.0)
MEAN CORPUSCULAR VOL: 91.6 fL (ref 80.0–100.0)
MEAN PLATELET VOLUME: 11.1 fL (ref 8.7–12.5)
MONOCYTE %: 8.4 % (ref 4.0–13.0)
NEUTROPHIL %: 62.4 % (ref 40.0–75.0)
PLATELET COUNT: 320 10*3/uL (ref 150–400)
RBC DISTRIBUTION WIDTH STD DEV: 46.1 fL (ref 35.1–46.3)
RBC DISTRIBUTION WIDTH: 14.3 % (ref 11.5–14.3)
RED BLOOD CELL COUNT: 4.78 M/uL (ref 3.90–5.20)
WHITE BLOOD CELL COUNT: 15 10*3/uL — ABNORMAL HIGH (ref 4.0–11.0)

## 2012-11-19 LAB — COMPREHENSIVE METABOLIC PANEL
ALANINE AMINOTRANSFERASE: 18 IU/L (ref 7–35)
ALBUMIN: 3.9 g/dl (ref 3.4–4.8)
ALKALINE PHOSPHATASE: 75 IU/L (ref 25–106)
ANION GAP: 8 mmol/L (ref 3–11)
ASPARTATE AMINOTRANSFERASE: 18 IU/L (ref 8–34)
BILIRUBIN TOTAL: 0.3 mg/dl (ref 0.2–1.1)
BUN (UREA NITROGEN): 14 mg/dl (ref 6–20)
CALCIUM: 9.6 mg/dl (ref 8.6–10.3)
CARBON DIOXIDE: 27 mmol/L (ref 22–32)
CHLORIDE: 103 mmol/L (ref 101–111)
CREATININE: 0.6 mg/dl (ref 0.4–1.2)
ESTIMATED GLOMERULAR FILT RATE: >60 mL/min (ref >60)
Glucose Random: 94 mg/dl (ref 74–160)
POTASSIUM: 4.3 mmol/L (ref 3.5–5.1)
SODIUM: 138 mmol/L (ref 135–144)
TOTAL PROTEIN: 6.6 g/dl (ref 5.9–7.5)

## 2012-11-19 LAB — HOLD BLUE TOP TUBE

## 2012-11-19 LAB — TROPONIN I: TROPONIN I: 0.02 ng/mL (ref 0.00–0.04)

## 2012-11-19 LAB — LIPASE: LIPASE: 43 U/L (ref 10–50)

## 2012-11-19 LAB — HOLD PURPLE TOP TUBE

## 2012-11-19 MED ORDER — ASPIRIN 81 MG PO CHEW
324.00 mg | CHEWABLE_TABLET | Freq: Once | ORAL | Status: AC
Start: 2012-11-19 — End: 2012-11-19
  Administered 2012-11-19: 324 mg via ORAL
  Filled 2012-11-19: qty 4

## 2012-11-19 MED ORDER — MORPHINE SULFATE (PF) 4 MG/ML IV SOLN
4.00 mg | Freq: Once | INTRAVENOUS | Status: AC
Start: 2012-11-19 — End: 2012-11-19
  Administered 2012-11-19: 4 mg via INTRAVENOUS
  Filled 2012-11-19: qty 1

## 2012-11-19 MED ORDER — ALUMINUM-MAGNESIUM-SIMETHICONE 200-200-20 MG/5ML PO SUSP
30.00 mL | Freq: Once | ORAL | Status: AC
Start: 2012-11-19 — End: 2012-11-19
  Administered 2012-11-19: 30 mL via ORAL
  Filled 2012-11-19: qty 30

## 2012-11-19 MED ORDER — LIDOCAINE VISCOUS 2 % MT SOLN
5.00 mL | Freq: Once | OROMUCOSAL | Status: AC
Start: 2012-11-19 — End: 2012-11-19
  Administered 2012-11-19: 5 mL via OROMUCOSAL
  Filled 2012-11-19: qty 15

## 2012-11-19 NOTE — ED Triage Note (Signed)
Pt presents via triage with sudden on set of MSCP , pain started 30 minutes ago at rest. + dizziness, denies nausea , denies diaphoresis.   Pt states she was on an antibiotic( ? Clarithmycin)  for a viral infection but stopped taking it because she did not like the way it felt.   IV , O2, and cardiac moniter initiated .

## 2012-11-19 NOTE — ED Notes (Signed)
Report to Mary, RN

## 2012-11-19 NOTE — ED Provider Notes (Signed)
The patient was seen primarily by me. ED nursing record was reviewed. Prior records as available electronically through the Epic record were reviewed.        HPI:    This 57 year old female patient with past medical history diabetes type 2, hyperlipidemia, hypertension, GERD, COPD, depression, spinal stenosis, previous atypical chest pain with negative stress test presents to the emergency department complaining of acute onset of midsternal sharp chest pain at approximately 6:30 PM.  Patient states that it started as a sharp pain in her right anterior chest and then migrated towards the middle.  Pain does not radiate elsewhere.  Not pleuritic.  Denies any associated nausea or diaphoresis.  Is having some palpitations. Denies any recent exertional symptoms.  Was on clarithromycin for recent cough, congestion, and diarrhea, stopped this several days ago.  Reports one loose stool this morning, no continued diarrhea.  No nausea or vomiting.  Also complaining of mild headache and mild congestion, but denies any significant cough or difficulty breathing.  No unusual lower extremity edema or pain.  No urinary complaints.  No abdominal pain.  No GI bleeding Patient does admit to significant stress recently due to having to take care of her daughter's young children and feeling overwhelmed with this.  Children are currently with her daughter and she is not concerned for their safety.  Denies any suicidal ideation or homicidal ideation.     Patient had a negative stress echo in April of 2013.      ROS: Pertinent positives were reviewed as per the HPI above. All other systems were reviewed and are negative.      Past Medical History/Problem list:    Past Medical History    Back pain     Diabetes     Disorders of lipoid metabolism     HTN (hypertension)     Esophageal reflux     Arthritis     Chronic bronchitis      Patient Active Problem List:     Chest pain     Hypoxia     Diabetes mellitus     Chronic bronchitis with COPD  (chronic obstructive pulmonary disease)        Past Surgical History:     Past Surgical History    LAPAROSCOPY SURG CHOLECYSTECTOMY      OB ANTEPARTUM CARE CESAREAN DLVR & POSTPARTUM      ANES NERVE MUSC TENDON FASCIA&BURSA KNEE&/POPLT      TONSILLECTOMY & ADENOIDECTOMY       TOTAL KNEE REPLACEMENT      Comment left       Medications:   No current facility-administered medications on file prior to encounter.  Current Outpatient Prescriptions on File Prior to Encounter:  metoprolol (TOPROL-XL) 25 MG 24 hr tablet Take 0.5 tablets by mouth daily. At bedtime- half pill Disp: 15 tablet Rfl: 12   carisoprodol (SOMA) 350 MG tablet Take 350 mg by mouth 4 (four) times daily as needed. Disp:  Rfl:    simvastatin (ZOCOR) 40 MG tablet Take 40 mg by mouth nightly. Disp:  Rfl:    lidocaine (LIDODERM) 5 % patch Place 1 patch onto the skin daily. Disp:  Rfl:    fluticasone (FLONASE) 50 MCG/ACT nasal spray 1 spray by Each Nostril route daily. Disp:  Rfl:    albuterol (PROVENTIL HFA,VENTOLIN HFA) 108 (90 BASE) MCG/ACT inhaler Inhale 2 puffs into the lungs every 6 (six) hours as needed. Disp:  Rfl:    oxycodone-acetaminophen (PERCOCET) 5-325  MG per tablet Take 1 tablet by mouth every 4 (four) hours as needed. Disp:  Rfl:    albuterol-ipratropium (COMBIVENT) 18-103 MCG/ACT inhaler Inhale 2 puffs into the lungs 4 (four) times daily. Disp: 1 Inhaler Rfl: 1   cholecalciferol (VITAMIN D3) 1000 UNIT tablet Take 1 tablet by mouth daily. Disp: 30 tablet Rfl: 1   aspirin 81 MG tablet Take 81 mg by mouth daily. Disp:  Rfl:    venlafaxine (EFFEXOR) 75 MG tablet Take 75 mg by mouth daily. Disp:  Rfl:    LISINOPRIL OR Take 20 mg by mouth daily. Disp:  Rfl:    VITAMIN D OR Take 1,000 Int'l Units by mouth daily. Disp:  Rfl:    METFORMIN HCL OR 2000 mg po daily with dinner Disp:  Rfl:    OMEPRAZOLE OR 20 mg po daily Disp:  Rfl:    SIMVASTATIN OR 40 mg po QHS Disp:  Rfl:        Social History:   Social History   Marital Status: Divorced  Spouse Name:  N/A    Years of Education: N/A  Number of Children: N/A     Occupational History  None on file     Social History Main Topics   Smoking status: Current Every Day Smoker  0.50 Packs/Day     Smokeless tobacco:     Alcohol Use: No    Drug Use: No    Sexually Active:      Other Topics Concern   None on file     Social History Narrative   None on file     The patient lives at home.   Tobacco: Current  EtOH: Denies  Street drugs: Denies    Family History: Significant for coronary disease in her mother    Allergies:  Review of Patient's Allergies indicates:   Motrin (ibuprofen)      Nausea Only   Bactrim                 Itching   Codeine camsylate           Physical Exam:  Patient Vitals for the past 24 hrs:   BP Temp Pulse Resp SpO2 Weight   11/19/12 1923 114/73 mmHg 98 F 112 22 98 % 104.327 kg (230 lb)       GENERAL:  WDWN, anxious, but in no acute distress, non-toxic   SKIN:  Warm & Dry, no rash, no petechia.  HEAD:  NCAT. Sclerae are anicteric and aninjected. Airway is patent. Oropharynx is clear with moist mucous membranes. Posterior pharynx without erythema, edema, or exudate. PERRL.  NECK:  No meningismus. No stridor.  LUNGS:  Clear to auscultation bilaterally. No wheezes, rales, rhonchi.   HEART:  RRR. Normal S1 and S2. No murmurs, rubs, or gallops. 2+radial pulses bilaterally.  Chest: Reproducible tenderness to palpation in the right lower sternal border.  ABDOMEN:  Soft, NTND.  No masses.  No involuntary guarding or rebound.   EXTREMITIES:  No obvious deformities.  Warm and well perfused.  No calf tenderness. No cyanosis, clubbing, or edema.   GENITOURINARY:  No CVA tenderness.   NEUROLOGIC:  Alert and oriented x4; moves all extremities well; speaking in clear fluent sentences.   PSYCHIATRIC:  Appropriate for age, time of day, and situation    Electrocardiogram: Sinus tachycardia, rate 108.  Normal PR interval.  Left axis deviation.  RSR' prime.  Q waves in 3 and aVF.  Normal ST segments.  Interval deepening of  Q waves in 3 and aVF compared to prior electrocardiogram from 03/07/2012.    ED Course and Medical Decision-making:  57 year old female patient presenting to the emergency department acute atypical chest pain in the setting of significant stress.  No red flags symptoms such as nausea, diaphoresis, or difficulty breathing.  No recent exertional symptoms.  However, patient does have multiple risk factors for coronary artery disease.  Vital signs notable for sinus tachycardia, otherwise unremarkable.  Physical exam is benign.  Chest pain is reproducible on palpation.  No evidence of deep vein thrombosis.  Electrocardiogram with somewhat more pronounced inferior Q waves, but no acute appearing ischemic changes.  Chest x-ray unremarkable on my read.  No significant abnormalities on initial laboratory studies, including a negative troponin, except for elevated white blood cell count of unclear etiology.  Patient without any infectious symptoms.  Possibly due to de-margination in the setting of stress.  A repeat troponin was sent 6 hours after the onset of the patient's pain at 12:30 in the morning.  However, patient elected to signout AGAINST MEDICAL ADVICE prior to receiving the results of this repeat troponin.  Patient was feeling much better after treatment with a GI cocktail, and morphine.  She did not have any further recurrence of her chest pain one emergency department.  Overall, it was felt that the patient's pain was likely atypical, related to stress, with a musculoskeletal component.  However, was discussed with the patient that without the repeat troponin we could not ensure that she was not having cardiac ischemia and that leaving prior to the results of the second troponin could pose a risk of undiagnosed heart attack with possible associated cardiac damage including sudden cardiac death.  Patient expressed understanding of these risks, and elected to leave AGAINST MEDICAL ADVICE.  Patient advised to  followup with her primary care provider.    Reasons to return to the ED were reviewed in detail. The patient agrees with this plan and disposition.      Condition on Discharge: Improved and Stable    Diagnosis/Diagnoses:  Chest pain    Karie Kirks, MD        This Emergency Department patient encounter note was created using voice-recognition software and in real time during the ED visit. Please excuse any typographical errors that have not been edited out.

## 2012-11-19 NOTE — ED Notes (Signed)
Resting comfortably awaiting 6 hour trop.

## 2012-11-19 NOTE — ED Notes (Signed)
Assumed care of pt. From [employer]. Pt. Sleeping.

## 2012-11-19 NOTE — ED Notes (Signed)
Pt to XR via stretcher.

## 2012-11-20 LAB — XR CHEST 2 VIEWS

## 2012-11-20 LAB — HOLD PURPLE TOP TUBE

## 2012-11-20 LAB — TROPONIN I: TROPONIN I: 0.01 ng/mL (ref 0.00–0.04)

## 2012-11-20 NOTE — ED Notes (Signed)
Pt. States she is pain free and is requesting to be discharged.

## 2012-11-20 NOTE — Discharge Instructions (Signed)
What we did in the Emergency Department (ED):  - electrocardiogram: no change  - chest x-ray: normal  - blood testing: no significant abnormality, no signs of heart attack on first round of testing, but you did not wait to find out the results of the 2nd round of testing    Next steps:  - He decided to leave the emergency department prior to getting the results of the second round of blood testing.  We're performing his blood testing to make sure that you haven't had a small heart attack.  Without knowing the results of this test, we cannot be sure that you haven't had a small heart attack.  An undiagnosed heart attack can lead to worsening heart problem such as an irregular rhythm, heart failure, and sudden cardiac death.  You expressed understanding of this, but elected to be discharged home AGAINST MEDICAL ADVICE.  - Continue your home medications as previously prescribed.  - Alternate doses of Tylenol (acetaminophen) with Motrin/Advil (ibuprofen) to control fever/pain.  Wait 6 hours between the SAME kind of medication.  An example schedule follows:       6:00am   Motrin/Advil (ibuprofen)  400 MG = 2 REGULAR strength tablets, TAKE WITH FOOD!     9:00am   Tylenol (acetaminophen)  975 MG = 3 REGULAR strength tablets     12:00noon   Motrin/Advil     3:00pm   Tylenol     6:00pm   Motrin/Advil     9:00pm   Tylenol     Etcetera  (NEVER MORE THAN FOUR (4) DOSES TYLENOL in one day as too much Tylenol will poison your liver!!!)      Come back to the Emergency Department (ED) for: Recurrent chest pain, difficulty breathing, nausea, sweatiness, racing heart, thoughts of hurting herself or others, any other concerns.    Thank you for your patience.      Chest Pain (Nonspecific)  It is often hard to give a specific diagnosis for the cause of chest pain. There is always a chance that your pain could be related to something serious, such as a heart attack or a blood clot in the lungs. You need to follow up with your caregiver  for further evaluation.  CAUSES    Heartburn.   Pneumonia or bronchitis.   Anxiety or stress.   Inflammation around your heart (pericarditis) or lung (pleuritis or pleurisy).   A blood clot in the lung.   A collapsed lung (pneumothorax). It can develop suddenly on its own (spontaneous pneumothorax) or from injury (trauma) to the chest.   Shingles infection (herpes zoster virus).  The chest wall is composed of bones, muscles, and cartilage. Any of these can be the source of the pain.   The bones can be bruised by injury.   The muscles or cartilage can be strained by coughing or overwork.   The cartilage can be affected by inflammation and become sore (costochondritis).  DIAGNOSIS   Lab tests or other studies, such as X-rays, electrocardiography, stress testing, or cardiac imaging, may be needed to find the cause of your pain.   TREATMENT    Treatment depends on what may be causing your chest pain. Treatment may include:   Acid blockers for heartburn.   Anti-inflammatory medicine.   Pain medicine for inflammatory conditions.   Antibiotics if an infection is present.   You may be advised to change lifestyle habits. This includes stopping smoking and avoiding alcohol, caffeine, and chocolate.   You  may be advised to keep your head raised (elevated) when sleeping. This reduces the chance of acid going backward from your stomach into your esophagus.   Most of the time, nonspecific chest pain will improve within 2 to 3 days with rest and mild pain medicine.  HOME CARE INSTRUCTIONS    If antibiotics were prescribed, take your antibiotics as directed. Finish them even if you start to feel better.   For the next few days, avoid physical activities that bring on chest pain. Continue physical activities as directed.   Do not smoke.   Avoid drinking alcohol.   Only take over-the-counter or prescription medicine for pain, discomfort, or fever as directed by your caregiver.   Follow your caregiver's  suggestions for further testing if your chest pain does not go away.   Keep any follow-up appointments you made. If you do not go to an appointment, you could develop lasting (chronic) problems with pain. If there is any problem keeping an appointment, you must call to reschedule.  SEEK MEDICAL CARE IF:    You think you are having problems from the medicine you are taking. Read your medicine instructions carefully.   Your chest pain does not go away, even after treatment.   You develop a rash with blisters on your chest.  SEEK IMMEDIATE MEDICAL CARE IF:    You have increased chest pain or pain that spreads to your arm, neck, jaw, back, or abdomen.   You develop shortness of breath, an increasing cough, or you are coughing up blood.   You have severe back or abdominal pain, feel nauseous, or vomit.   You develop severe weakness, fainting, or chills.   You have a fever.  THIS IS AN EMERGENCY. Do not wait to see if the pain will go away. Get medical help at once. Call your local emergency services (911 in [place] Do not drive yourself to the hospital.  MAKE SURE YOU:    Understand these instructions.   Will watch your condition.   Will get help right away if you are not doing well or get worse.  Document Released: 08/15/2005 Document Revised: 01/28/2012 Document Reviewed: 06/10/2008  [facility] Patient Information 2013 [place], [place].

## 2012-11-20 NOTE — ED Notes (Signed)
Dr. Rene Paci at bedside.

## 2012-11-20 NOTE — ED Notes (Signed)
Patient Disposition    Patient education for diagnosis, medications, activity, diet and follow-up.  Patient left ED 12:49 AM.  Patient rep received written instructions.  Interpreter to provide instructions: No    Discharged to: Discharged to home against medical advice.

## 2012-11-21 LAB — EKG

## 2013-02-14 ENCOUNTER — Encounter (HOSPITAL_BASED_OUTPATIENT_CLINIC_OR_DEPARTMENT_OTHER): Payer: Self-pay | Admitting: Emergency Medicine

## 2013-02-14 ENCOUNTER — Emergency Department (HOSPITAL_BASED_OUTPATIENT_CLINIC_OR_DEPARTMENT_OTHER)
Admission: RE | Admit: 2013-02-14 | Disposition: A | Discharge status: Home / Self Care | Payer: Self-pay | Source: Emergency Department | Attending: Emergency Medicine | Admitting: Emergency Medicine

## 2013-02-14 LAB — CBC WITH PLATELET
HEMATOCRIT: 39.4 % (ref 34.1–44.9)
HEMOGLOBIN: 13 g/dL (ref 11.2–15.7)
MEAN CORP HGB CONC: 33 g/dL (ref 31.0–37.0)
MEAN CORPUSCULAR HGB: 29.5 pg (ref 26.0–34.0)
MEAN CORPUSCULAR VOL: 89.5 fL (ref 80.0–100.0)
MEAN PLATELET VOLUME: 10.7 fL (ref 8.7–12.5)
PLATELET COUNT: 290 10*3/uL (ref 150–400)
RBC DISTRIBUTION WIDTH STD DEV: 45 fL (ref 35.1–46.3)
RBC DISTRIBUTION WIDTH: 14.1 % (ref 11.5–14.3)
RED BLOOD CELL COUNT: 4.4 M/uL (ref 3.90–5.20)
WHITE BLOOD CELL COUNT: 10.7 10*3/uL (ref 4.0–11.0)

## 2013-02-14 LAB — BASIC METABOLIC PANEL
ANION GAP: 10 mmol/L (ref 5–15)
BUN (UREA NITROGEN): 13 mg/dL (ref 7–18)
CALCIUM: 9.3 mg/dL (ref 8.5–10.1)
CARBON DIOXIDE: 27 mmol/L (ref 21–32)
CHLORIDE: 103 mmol/L (ref 98–107)
CREATININE: 0.6 mg/dL (ref 0.4–1.2)
ESTIMATED GLOMERULAR FILT RATE: >60 mL/min (ref >60)
Glucose Random: 97 mg/dL (ref 74–160)
POTASSIUM: 4.2 mmol/L (ref 3.5–5.1)
SODIUM: 140 mmol/L (ref 136–145)

## 2013-02-14 LAB — HOLD BLUE TOP TUBE

## 2013-02-14 LAB — HOLD GREEN TOP TUBE

## 2013-02-14 MED ORDER — METOCLOPRAMIDE HCL 5 MG/ML IJ SOLN
10.00 mg | Freq: Once | INTRAMUSCULAR | Status: AC
Start: 2013-02-14 — End: 2013-02-14
  Administered 2013-02-14: 10 mg via INTRAVENOUS
  Filled 2013-02-14: qty 2

## 2013-02-14 MED ORDER — SODIUM CHLORIDE 0.9 % IV BOLUS
1000.00 mL | Freq: Once | INTRAVENOUS | Status: AC
Start: 2013-02-14 — End: 2013-02-14
  Administered 2013-02-14: 1000 mL via INTRAVENOUS

## 2013-02-14 MED ORDER — KETOROLAC TROMETHAMINE 30 MG/ML IJ SOLN
30.00 mg | Freq: Once | INTRAMUSCULAR | Status: AC
Start: 2013-02-14 — End: 2013-02-14
  Administered 2013-02-14: 30 mg via INTRAVENOUS
  Filled 2013-02-14: qty 1

## 2013-02-14 NOTE — ED Notes (Signed)
Pt has photophobia, denies any blurry vision. Lights turned low for patient comfort, bed in lowest locked position, side rails up x 2.  Call bell within reach.

## 2013-02-14 NOTE — ED Provider Notes (Signed)
ED nursing record was reviewed. Prior records as available electronically through the Epic record were reviewed.      HPI:    This 57 year old female patient presents to the Emergency Department with chief complaint of dizziness.  The patient has had a headache and lightheadedness for about a week and a half.  She says it doesn't feel like disequilibrium or vertigo.  She describes it as a feeling like she is about to pass out. She saw her doctor 3 days ago for this.  She was told that her ears were red and she was given a prescription for doxycycline which she has been taking.  The headache is across her whole head and she describes it as "scrambling."  She has a history of headaches from time to time but they usually don't last this long.  No trauma, fevers, neck pain, neck stiffness, numbness, weakness, blurry vision, nausea, vomiting, diarrhea, bloody stools, cough, trouble breathing, chest pain, abdominal pain.    ROS: Pertinent positives were reviewed as per the HPI above. All other systems were reviewed and are negative.      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  Current Outpatient Prescriptions on File Prior to Encounter:  metoprolol (TOPROL-XL) 25 MG 24 hr tablet Take 0.5 tablets by mouth daily. At bedtime- half pill  Disp: 15 tablet Rfl: 12   carisoprodol (SOMA) 350 MG tablet Take 350 mg by mouth 4 (four) times daily as needed. Disp:  Rfl:    albuterol (PROVENTIL HFA,VENTOLIN HFA) 108 (90 BASE) MCG/ACT inhaler Inhale 2 puffs into the lungs every 6 (six) hours as needed. Disp:  Rfl:    oxycodone-acetaminophen (PERCOCET) 5-325 MG per tablet Take 1 tablet by mouth every 4 (four) hours as needed. Disp:  Rfl:    cholecalciferol (VITAMIN D3) 1000 UNIT tablet Take 1 tablet by mouth daily. Disp: 30 tablet Rfl: 1   LISINOPRIL OR Take 20 mg by mouth daily. Disp:  Rfl:    METFORMIN HCL OR 2000 mg po daily with dinner Disp:  Rfl:    simvastatin (ZOCOR) 40 MG tablet Take 40 mg by mouth nightly. Disp:  Rfl:    lidocaine (LIDODERM) 5 % patch Place 1 patch onto the skin daily. Disp:  Rfl:    fluticasone (FLONASE) 50 MCG/ACT nasal spray 1 spray by Each Nostril route daily. Disp:  Rfl:    albuterol-ipratropium (COMBIVENT) 18-103 MCG/ACT inhaler Inhale 2 puffs into the lungs 4 (  four) times daily. Disp: 1 Inhaler Rfl: 1   aspirin 81 MG tablet Take 81 mg by mouth daily. Disp:  Rfl:    venlafaxine (EFFEXOR) 75 MG tablet Take 75 mg by mouth daily. Disp:  Rfl:    VITAMIN D OR Take 1,000 Int'l Units by mouth daily. Disp:  Rfl:    OMEPRAZOLE OR 20 mg po daily Disp:  Rfl:    SIMVASTATIN OR 40 mg po QHS Disp:  Rfl:          Social History:   Social History   Marital Status: Divorced  Spouse Name: N/A    Years of Education: N/A  Number of Children: N/A     Occupational History  None on file     Social History Main Topics   Smoking status: Current Every Day Smoker  0.50 Packs/Day     Smokeless tobacco:     Alcohol Use: No    Drug Use: No    Sexually Active:      Other Topics Concern   None on file     Social History Narrative   None on file           Allergies:  Review of Patient's Allergies indicates:   Motrin (ibuprofen)      Nausea Only   Bactrim                 Itching   Codeine camsylate             Physical Exam:  BP 117/92  Pulse 110  Temp(Src) 98.1  F  Resp 22  Wt 98.884 kg (218 lb)  BMI 39.86 kg/m2  SpO2 98%  LMP 10/22/2007    GENERAL: No acute distress, non-toxic.   SKIN:  Warm & Dry, no rash.  HEAD:  NCAT. Sclerae are anicteric and aninjected, oropharynx is clear with moist mucous membranes.  TMs partially obscured by cerumen but not erythematous.  NECK:  Supple.  No meningismus.  No C-spine tenderness  LUNGS:  Clear to auscultation bilaterally. No wheezes, rales, rhonchi.   HEART:  RRR.   ABDOMEN:  Soft, NTND.  No involuntary guarding or rebound.   EXTREMITIES:  No obvious deformities.   NEUROLOGIC:  Alert and oriented; moves all extremities well; speaking in clear fluent sentences.  Pupils equal round and reactive to light.  Extraocular muscles intact.  PSYCHIATRIC: Anxious        ED Course and Medical Decision-making:  This 57 year old female patient presents with presyncope and headache for one and a half weeks.  She is neurologically intact with no signs of meningismus.  No symptoms concerning for subarachnoid hemorrhage.  She was treated with Reglan and IV fluids and she improved.  She is able to walk without difficulty.  Labs are unremarkable.  No indication for head CT at this time.  She can followup with her primary care Dr.      Condition: Improved, stable  Disposition: Discharge      Diagnosis/Diagnoses:  Headache, lightheadedness    Marylouise Stacks, MD

## 2013-02-14 NOTE — ED Notes (Signed)
Pt ambulated denies dizziness. States she is feeling better.

## 2013-02-14 NOTE — ED Triage Note (Signed)
Pt presents to ED with complaints of dizziness and headache.  +nausea.  For the past four days.

## 2013-02-14 NOTE — Discharge Instructions (Signed)
Index Spanish version Related Topics   Dizziness   What is dizziness?   Dizziness is often used to describe different symptoms. It can mean that you feel lightheaded, unsteady, or woozy or it may mean you feel like you or the room is spinning. It is important for you to explain to your provider what you mean when you say you are feeling dizzy. The 2 most common conditions referred to by the term “dizziness” are lightheadedness and vertigo.  Lightheadedness is the feeling that you might get when you stand up too fast and feel like you might faint. Sometimes the room even “goes black” when, for a few seconds, there’s not enough oxygen in your brain.  The feeling that you are spinning or the room is spinning is called vertigo. It is a loss of balance. It makes it hard to keep standing. In fact, you cannot tell which way “up” is. This loss of balance often comes with nausea and vomiting and sometimes sweating. Often, even as you lie in bed, the room seems to be spinning around you. It can be very disabling for days or weeks.  How does it occur?   Dizziness is a symptom, not a disease. Most often it is mild and doesn’t last long, and a cause is not found. Sometimes it is a sign of another problem.  Lightheadedness can be caused by tiredness, stress, fever, dehydration, low blood sugar, low blood pressure, anemia, head injury, heart or circulation problems, or stroke. Dizziness is a common problem during pregnancy. It can also be caused by some medicines.  Feelings of vertigo may be caused by an infection or disease in the inner ear. For example, one possible cause is inflammation of the inner ear called labyrinthitis. Other inner ear problems that can trigger vertigo are Ménière’s disease and positional vertigo.  As you get older, you may have atherosclerosis (hardening of the arteries) or arthritis in your neck. These diseases may cause vertigo or lightheadedness when you suddenly move your head or bend your head  back. Dizziness of both kinds happens more often in your later years than at other ages but it is not always caused by disease.  Some mental health problems can cause lightheadedness. For example, anxiety might cause fast, shallow breathing, which can make you feel lightheaded.   Less common causes of dizziness are tumors or infections in the brain, or multiple sclerosis (MS).  How is it diagnosed?   Your healthcare provider will ask you to describe your dizziness and how it happens in as much detail as you can. Your provider will want to know about any other symptoms or medical problems you are having. Your provider may see if you get dizzy again by doing things that may have caused your dizziness, such as rapid breathing.  Your provider will examine your ears, eyes, and nervous system. You may have a CT or MRI scan of the brain to look for something that might be causing dizziness, such as a tumor, stroke, or multiple sclerosis.  How is it treated?   The treatment depends on the cause of the dizziness. If your healthcare provider finds a problem that is causing the dizziness, you will be treated for the problem. For example, if you have Ménière’s disease, your provider may recommend a low-salt diet to decrease any swelling in your inner ear. You may also be given steroid medicine to decrease swelling and inflammation. If your provider thinks you have a bacterial infection, he or she   may prescribe antibiotics. In some cases antiviral medicines may be prescribed.  Your provider may also prescribe medicine for the balance mechanism in your inner ear. This medicine is usually the same medicine you might take for motion sickness, such as meclizine (Antivert). The medicine decreases the feeling of vertigo, but it can make you sleepy.   How long will the dizziness last?   Depending on the cause, mild vertigo usually lasts no longer than 1 to 2 weeks. More severe vertigo can last several weeks. With Ménière’s disease, the  vertigo may come and go, or it can become an ongoing problem.  Lightheadedness usually lasts only a few seconds or maybe a minute. Depending on the cause, it may happen occasionally or every time you stand up. In most cases when your healthcare provider determines and treats the cause of your lightheadedness, it goes away. Sometimes, especially if you have a number of medical problems and are taking several medicines, it can be a challenge to find the cause and cure for the lightheadedness. It is important to work with your provider and to let him or her know what is working and what is not. It is important to treat the lightheadedness to prevent falls.   How can I take care of myself?   · The best thing to do when you are feeling dizzy is lie down, relax, and wait for the feeling to go away.   · Try to avoid positions or activities that cause the dizziness. Move slowly, especially when you stand up.   · If you feel dizzy while standing in one spot for a long time, shift your weight from one leg to the other. Keep your knees slightly flexed, not “locked” straight. Shifting your weight will help get your circulation going again. If you keep being dizzy, sit or lie down on your side. Especially try to avoid sitting or standing in one position for a long time if you are pregnant.”   · If you become dizzy while you are driving, pull over to the side of the road and wait until the dizziness goes away. Do not drive a car or run machinery when you are dizzy.   · If you smoke, stop. If someone else in your household smokes, ask them to smoke outside. Smoking can increase dizziness.   · Call 911 for emergency help right away if:  ¨ You suddenly have other symptoms, such as double vision, blindness, or numbness or weakness on one side of your face or body. These symptoms with dizziness might mean you are having a stroke, and strokes need to be treated right away. Some strokes can be prevented by treatment within 3 hours of  the first symptoms.  ¨ You have lightheadedness with chest discomfort or arm, neck, or jaw pain.  · Call your healthcare provider right away if:  ¨ You have nausea and vomiting that is not helped by the medicine you were given.  · Call your provider during office hours if:  ¨ You are following the recommended treatment but keep having severe, long, or repeated attacks of dizziness.  ¨ You have new symptoms along with dizziness, such as a loss of hearing.  ¨ You have other symptoms or questions that worry you.  Developed by [employer].  Adult Advisor 2012.1 published by [employer].  Last modified: 2010-05-16  Last reviewed: 2010-04-20  This content is reviewed periodically and is subject to change as new health information becomes available. The information is intended   to inform and educate and is not a replacement for medical evaluation, advice, diagnosis or treatment by a healthcare professional.  References   Adult Advisor 2012.1 Index   © 2012 [employer] and/or its affiliates. All rights reserved.

## 2013-02-14 NOTE — ED Notes (Signed)
ID bracelet verified by RN. MD in to eval pt.

## 2013-02-14 NOTE — ED Notes (Signed)
Pt feeling better. Watching tv

## 2013-02-14 NOTE — ED Notes (Signed)
Patient Disposition    Patient education for diagnosis, medications, activity, diet and follow-up.  Patient left ED 9:48 PM.  Patient rep received written instructions.  Interpreter to provide instructions: No    Discharged to: Discharged to home

## 2013-02-18 LAB — EKG

## 2013-06-17 ENCOUNTER — Ambulatory Visit (HOSPITAL_BASED_OUTPATIENT_CLINIC_OR_DEPARTMENT_OTHER): Payer: MEDICARE | Admitting: Gastroenterology

## 2013-06-18 ENCOUNTER — Ambulatory Visit (HOSPITAL_BASED_OUTPATIENT_CLINIC_OR_DEPARTMENT_OTHER): Payer: MEDICARE | Admitting: Registered Nurse

## 2013-06-18 VITALS — BP 90/50 | HR 112

## 2013-06-18 DIAGNOSIS — E119 Type 2 diabetes mellitus without complications: Secondary | ICD-10-CM

## 2013-06-18 LAB — BLOOD SUGAR FINGERSTICK (POINT OF CARE): FINGERSTICK GLUCOSE: 102 mg/dl (ref 74–160)

## 2013-06-18 NOTE — Progress Notes (Signed)
Samantha Cameron was referred by her MD in [place]. Type 2 for 3 years. A1c was 6.1  04/22/13, 5.1 02/11/13. She is taking metformin 500mg  4 pills a day.  Chol 146, ldl 54, hdl 42  Trig. 252.    She doesn't eat very much- drinking glucerna 1-2 cans a day. Recently lost a significant amount of weight.    Loss 59yo son recently. Is grieving.    I called [facility]) re her insurance. She has [employer]- which covers One touch meter. I provided teaching, she demonstrated well how to use.      I called in RX for above supplies. Since medicare- they need a hard copy. I called her PCP office in [place] [phone] Dr.Maureen Sharma Covert.    I offered for her to see RD at our site, but she deferred at this time.     Discussed a1c results of 6.1. Well controlled    Reviewed:  Foods high in CHO- use meter 2 hr pp to see how affects blood sugar prn       a1c goals- long term complications       Exercise- she does walk    Invited to return if needed. Otherwise- bring log or meter to her PCP visits.

## 2013-06-18 NOTE — Patient Instructions (Signed)
Check your finger stick 1 x day 2 hours after different meals.    Fasting- 80-120    2 hours after meals- want under 170.    Watch the carbohydrates- flour, bread, rice , pasta potatoes.    Welcome to come see nutritionist if you have questions about your food intake.

## 2013-08-06 ENCOUNTER — Ambulatory Visit (HOSPITAL_BASED_OUTPATIENT_CLINIC_OR_DEPARTMENT_OTHER): Payer: MEDICARE | Admitting: Gastroenterology

## 2013-08-06 VITALS — BP 99/56 | HR 98 | Temp 97.9°F | Wt 204.0 lb

## 2013-08-06 DIAGNOSIS — Z1211 Encounter for screening for malignant neoplasm of colon: Secondary | ICD-10-CM

## 2013-08-06 DIAGNOSIS — K625 Hemorrhage of anus and rectum: Secondary | ICD-10-CM

## 2013-08-06 MED ORDER — PEG 3350-KCL-NABCB-NACL-NASULF 236 G PO SOLR
4.00 L | Freq: Once | ORAL | Status: AC
Start: 2013-08-06 — End: 2013-08-06

## 2013-08-06 NOTE — Progress Notes (Signed)
Here for a screening colonoscopy. Has had one in the past and is due for another. States also has had episodes of rectal bleeding. Instructions for colonoscopy given at triage.      Safe at home

## 2013-08-06 NOTE — Progress Notes (Signed)
Jeremy Johann, MD  [address]  [place], [place] [address]    Dear Dr. Jeremy Johann, MD,    It is with great pleasure that I am seeing our mutual patient in the GI clinic at the [facility].  Reason For Consult:   I am asked to see our patient in clinical consultation for evaluation of a chief complaint of Screening for colon cancer  (primary encounter diagnosis)  Rectal bleeding    Chief Complaint: Evaluation of this chief complaint led to the following visit diagnoses:   Screening for colon cancer  (primary encounter diagnosis)  Rectal bleeding  History of the Present Illness: The patient is a 57 year old female comes in today for followup surveillance colonoscopy.  She has a history of rectal bleeding and also has had a prior colonoscopy in the past which according to her was normal.  She said the rectal bleeding occurred on a day when she was straining she had 3 episodes that day and has not had any further bleeding since then.  She denies any known history of colon cancer or any family history of colon cancer.  She is otherwise clinically stable.    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  Allergies:  Review of Patient's Allergies indicates:   Motrin [ibuprofen]      Nausea Only   Bactrim                 Itching   Codeine camsylate         Current Medications:    Current Outpatient  Prescriptions:  metoprolol (TOPROL-XL) 25 MG 24 hr tablet Take 0.5 tablets by mouth daily. At bedtime- half pill Disp: 15 tablet Rfl: 12   carisoprodol (SOMA) 350 MG tablet Take 350 mg by mouth 4 (four) times daily as needed. Disp:  Rfl:    simvastatin (ZOCOR) 40 MG tablet Take 40 mg by mouth nightly. Disp:  Rfl:    fluticasone (FLONASE) 50 MCG/ACT nasal spray 1 spray by Each Nostril route daily. Disp:  Rfl:    albuterol (PROVENTIL HFA,VENTOLIN HFA) 108 (90 BASE) MCG/ACT inhaler Inhale 2 puffs into the lungs every 6 (six) hours as needed. Disp:  Rfl:    oxycodone-acetaminophen (PERCOCET) 5-325 MG per tablet Take 1 tablet by mouth every 4 (four) hours as needed. Disp:  Rfl:    cholecalciferol (VITAMIN D3) 1000 UNIT tablet Take 1 tablet by mouth daily. Disp: 30 tablet Rfl: 1   aspirin 81 MG tablet Take 81 mg by mouth daily. Disp:  Rfl:  LISINOPRIL OR Take 20 mg by mouth daily. Disp:  Rfl:    METFORMIN HCL OR 2000 mg po daily with dinner Disp:  Rfl:    OMEPRAZOLE OR 20 mg po daily Disp:  Rfl:    polyethylene glycol (GOLYTELY,NULYTELY) 236 G suspension Take 4,000 mLs by mouth once. Disp: 4000 mL Rfl: 0   lidocaine (LIDODERM) 5 % patch Place 1 patch onto the skin daily. Disp:  Rfl:    albuterol-ipratropium (COMBIVENT) 18-103 MCG/ACT inhaler Inhale 2 puffs into the lungs 4 (four) times daily. Disp: 1 Inhaler Rfl: 1     No current facility-administered medications for this visit.  Review of Systems:  All systems were otherwise negative except for the GI and other systems documented in the History of the Present Illness.  Physical Exam:  The patient is sitting in the examining table and appears to be in no apparent distress.  BP 99/56   Pulse 98   Temp(Src) 97.9 F (36.6 C) (Oral)   Wt 204 lb (92.534 kg)   BMI 37.3 kg/m2   SpO2 99%   LMP 10/22/2007  HEENT: Normocephalic atraumatic  Oropharynx: Clear with no erythema or exudates  Neck: Supple no palpable lymphadenopathy in the neck or the axilla  Lungs: Clear to  ausculation bilaterally  Heart: Regular rate and rhythm,  no murmurs rubs or gallops  WJX:BJYN non tender with positive bowel sounds.  Extremities: No swelling or edema  Skin: No rashes or lesions  Neuro: Alert and oriented to person , place and time    A/P:  Screening for colon cancer  (primary encounter diagnosis)  Rectal bleeding  Active GI  Problems Addressed During This Visit (Visit Diagnoses):    Impression/Plan: The risks and benefits of colonoscopy were described to the patient and she was provided with a prescription for GoLYTELY preparation.  She will have the prescription filled and then will followup to undergo colonoscopy in the near future.  Further recommendations regarding subsequent screening and treatment will be forthcoming pending the completion of that study.  I wish to thank you for this referral and for the opportunity to participate in her care.      Sincerely,    Farley Ly MD, MS Clin EPI    WG:[id number] Odelia Gage, MD

## 2013-09-01 ENCOUNTER — Ambulatory Visit (HOSPITAL_BASED_OUTPATIENT_CLINIC_OR_DEPARTMENT_OTHER)
Admit: 2013-09-01 | Disposition: A | Discharge status: Home / Self Care | Payer: Self-pay | Source: Ambulatory Visit | Attending: Gastroenterology | Admitting: Gastroenterology

## 2013-09-01 ENCOUNTER — Encounter (HOSPITAL_BASED_OUTPATIENT_CLINIC_OR_DEPARTMENT_OTHER): Payer: Self-pay | Admitting: Gastroenterology

## 2013-09-01 MED ORDER — FENTANYL CITRATE 0.05 MG/ML IJ SOLN
25.0000 ug | INTRAMUSCULAR | Status: DC
Start: 2013-09-01 — End: 2013-09-01
  Administered 2013-09-01: 100 ug via INTRAVENOUS

## 2013-09-01 MED ORDER — LACTATED RINGERS IV SOLN
INTRAVENOUS | Status: DC
Start: 2013-09-01 — End: 2013-09-01
  Administered 2013-09-01: 11:00:00 via INTRAVENOUS

## 2013-09-01 MED ORDER — MIDAZOLAM HCL 2 MG/2ML IJ SOLN
0.5000 mg | INTRAMUSCULAR | Status: DC
Start: 2013-09-01 — End: 2013-09-01
  Administered 2013-09-01: 4 mg via INTRAVENOUS

## 2013-09-01 MED ORDER — FENTANYL CITRATE 0.05 MG/ML IJ SOLN
INTRAMUSCULAR | Status: DC
Start: 2013-09-01 — End: 2013-09-01
  Filled 2013-09-01: qty 2

## 2013-09-01 MED ORDER — ONDANSETRON HCL 4 MG/2ML IJ SOLN
4.0000 mg | INTRAMUSCULAR | Status: DC | PRN
Start: 2013-09-01 — End: 2013-09-01

## 2013-09-01 MED ORDER — DIPHENHYDRAMINE HCL 50 MG/ML IJ SOLN
25.0000 mg | Freq: Once | INTRAMUSCULAR | Status: DC | PRN
Start: 2013-09-01 — End: 2013-09-01

## 2013-09-01 MED ORDER — MIDAZOLAM HCL 2 MG/2ML IJ SOLN
INTRAMUSCULAR | Status: DC
Start: 2013-09-01 — End: 2013-09-01
  Filled 2013-09-01: qty 2

## 2013-09-01 MED ORDER — LIDOCAINE 2% UROJET
10.0000 mL | Freq: Once | CUTANEOUS | Status: DC
Start: 2013-09-01 — End: 2013-09-01

## 2013-09-01 MED ORDER — STERILE WATER FOR IRRIGATION IR SOLN
Freq: Once | Status: DC
Start: 2013-09-01 — End: 2013-09-01
  Filled 2013-09-01: qty 1.2

## 2013-09-01 NOTE — PROVATION-GI (Signed)
[facility]  Patient Name: Samantha Cameron  MRN: [id number]  Account Number: [id number]  Gender: Female  Age: 57  Date of Birth: Jan 08, 1956  Admit Type: Inpatient  Patient Location: [facility]  Note Status: Finalized  Referring MD:         Jeremy Johann, DO  Procedure Date:       09/01/2013 10:53:08 AM  Procedure:            Colonoscopy  Endoscopist:          Joretta Bachelor., MD  Indications for Procedure:       Rectal bleeding  Medications:          Fentanyl 100 micrograms IV, Midazolam 4 mg IV  Procedure:       Just prior to the procedure, an updated history and physical was done. I        obtained an informed consent from the patient reviewing the risk of the        procedure including (but not limited to) respiratory depression, perforation,        bleeding, discomfort, a possible need for surgery and unexpected reactions to        medications. The patient is aware that test has limitations and may not        detect significant lesions such as cancer or other potential diseases. The        patient was also informed that they might need a repeat colonoscopy earlier        than standard guidelines if there are changes in their symptoms or concerning        findings noted. A time out was performed with the entire procedure staff        present. The scope was passed under direct vision. Throughout the procedure,        the patient's blood pressure, pulse, and oxygen saturations were monitored        continuously. The PCF-Q180AL was introduced through the anus and advanced to        the cecum, identified by appendiceal orifice & ileocecal valve. The scope was        then slowly withdrawn with confirmation of the noted findings. The        colonoscopy was performed with moderate difficulty due to the patient's        discomfort during the procedure. Successful completion of the procedure was        aided by increasing the dose of sedation medication and straightening and        shortening the scope to  obtain bowel loop reduction. Scope withdrawal time        was 7 minutes.  Findings:       A pedunculated, non-bleeding polyp was found in the transverse colon. The        polyp was removed with a hot snare. Resection and retrieval were complete.        Estimated blood loss was minimal. Area was successfully injected with 2 mL        [place] ink for tattooing.       A sessile polyp was found in the transverse colon. The polyp was 3 mm in        size. The polyp was removed with a jumbo cold forceps. Resection and        retrieval were complete. Estimated blood loss was minimal.       Non-bleeding internal hemorrhoids were found during retroflexion and  were        moderate.       The exam was otherwise without abnormality.  Post Procedure Diagnosis:       - One, non-bleeding polyp in the transverse colon. Resected and retrieved.       - One 3 mm polyp in the transverse colon. Resected and retrieved.       - Non-bleeding internal hemorrhoids.       - The examination was otherwise normal.  Complications:        No immediate complications.  Recommendation:       - High fiber diet indefinitely.       - Await pathology results.       - Return to GI clinic in 1 week.  Joretta Bachelor., MD  Clerance Lav Gwyneth Sprout., MD  09/01/2013 12:03 PM  This report has been signed electronically.  Number of Addenda: 0  Note Initiated On: 09/01/2013 10:53 AM

## 2013-09-01 NOTE — Discharge Instructions (Signed)
Colonoscopy  Care After  Read the instructions outlined below and refer to this sheet in the next few weeks. These discharge instructions provide you with general information on caring for yourself after you leave the hospital. Your doctor may also give you specific instructions. While your treatment has been planned according to the most current medical practices available, unavoidable complications occasionally occur. If you have any problems or questions after discharge, call your doctor.  HOME CARE INSTRUCTIONS  ACTIVITY:  · You may resume your regular activity, but move at a slower pace for the next 24 hours.  · Take frequent rest periods for the next 24 hours.  · Walking will help get rid of the air and reduce the bloated feeling in your belly (abdomen).  · No driving for 24 hours (because of the medicine (anesthesia) used during the test).  · You may shower.  · Do not sign any important legal documents or operate any machinery for 24 hours (because of the anesthesia used during the test).  NUTRITION:  · Drink plenty of fluids.  · You may resume your normal diet as instructed by your doctor.  · Begin with a light meal and progress to your normal diet. Heavy or fried foods are harder to digest and may make you feel sick to your stomach (nauseated).  · Avoid alcoholic beverages for 24 hours or as instructed.  MEDICATIONS:  · You may resume your normal medications unless your doctor tells you otherwise.  WHAT TO EXPECT TODAY:  · Some feelings of bloating in the abdomen.  · Passage of more gas than usual.  · Spotting of blood in your stool or on the toilet paper.  IF YOU HAD POLYPS REMOVED DURING THE COLONOSCOPY:  · No aspirin products for 7 days or as instructed.  · No alcohol for 7 days or as instructed.  · Eat a soft diet for the next 24 hours.  FINDING OUT THE RESULTS OF YOUR TEST  Not all test results are available during your visit. If your test results are not back during the visit, make an appointment with  your caregiver to find out the results. Do not assume everything is normal if you have not heard from your caregiver or the medical facility. It is important for you to follow up on all of your test results.   SEEK IMMEDIATE MEDICAL CARE IF:  · You have more than a spotting of blood in your stool.  · Your belly is swollen (abdominal distention).  · You are nauseated or vomiting.  · You have a fever.  · You have abdominal pain or discomfort that is severe or gets worse throughout the day.  Document Released: 06/19/2004 Document Revised: 01/28/2012 Document Reviewed: 06/17/2008  [employer]® Patient Information ©2014 [employer].

## 2013-09-01 NOTE — H&P (Signed)
GI Pre-procedure History and Physical Short Form  Samantha Cameron is an 57 year old female.    Chief Complaint: She is here for Upper GI endoscopy    HPI: The patient is a 57 year old female with a history of rectal bleeding now here for diagnostic colonoscopy.    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)     Rectal bleeding      Past Medical History:     Past Medical History    Back pain     Diabetes     Disorders of lipoid metabolism     HTN (hypertension)     Esophageal reflux     Arthritis     Chronic bronchitis        Past Surgical History:      Past Surgical History    LAPAROSCOPY SURG CHOLECYSTECTOMY      OB ANTEPARTUM CARE CESAREAN DLVR & POSTPARTUM      ANES NERVE MUSC TENDON FASCIA&BURSA KNEE&/POPLT      TONSILLECTOMY & ADENOIDECTOMY       TOTAL KNEE REPLACEMENT      Comment left       Social History:    Social History   Marital Status: Divorced  Spouse Name: N/A    Years of Education: N/A  Number of Children: N/A     Occupational History  None on file     Social History Main Topics   Smoking status: Current Every Day Smoker  0.50 Packs/Day     Smokeless tobacco: Not on file    Alcohol Use: No    Drug Use: No    Sexually Active:      Other Topics Concern   None on file     Social History Narrative   None on file       Family History:  History reviewed. No pertinent family history.    Allergies:   Review of Patient's Allergies indicates:   Motrin [ibuprofen]      Nausea Only   Bactrim                 Itching   Codeine camsylate           Medications:     (Not in a hospital admission)    Review of Systems:  Review of Systems:  HEENT: No loose teeth, nosebleeds, glaucoma, cataracts    Cardiovascular:  No chest pain, palpitations, HTN, hypercholesterolemia, MI, heart surgery or heart failure.  Pulmonary:  No pneumonia, COPD, chronic cough,TB or asthma.    Neuro:  No stroke, seizure,  migraines or loss of consciousness.    Endocrine:  No diabetes or thyroid disease.    ID:  No TB, hepatitis A, hepatitis B, hepatitis C, HIV or rheumatic fever.    GU:  No hematuria, nephrolithiasis, kidney disease or bladder disease.  Heme/Onc:  No cancer, anemia or bleeding diathesis.   Derm:  No rash, hives, or shingles.    Psych: No anxiety disorder, depression, bullemia, anorexia nervosa, alcoholism or substance abuse  Musculoskeletal: No gout, arthritis, muscle disease or lupus.      Physical Exam:  Vital Signs: BP 139/87   Pulse 117   Temp(Src) 97.3 F (36.3 C) (Temporal)   Resp 16   Ht 5\' 2"  (1.575 m)   Wt 204 lb (92.534 kg)  BMI 37.3 kg/m2   SpO2 97%   LMP 10/22/2007  General: nml body habitus.   HEENT: Normocephalic, atrumatic, PERRLA, Extra occular motions intact. No scleral icterus. Pharynx benign. Tongue midline.  Airway Evaluation:  Gag reflex intact: Yes  Ability to open mouth wide:   Full  Dentures:  No  Loose teeth:  No  Neck range of motion  Full  Mallampati Airway Classification: Class II   The same as Class I except the tonsilar pillars are hidden by the tounge.  Pulmonary: Clear to auscultation and percussion. No rales, wheezes or ronchi.  Cardiovascular: S1, S2, no S3, S4, clicks, rubs or murmurs.  JVD not elevated with patient sitting.  Abdominal: Normal bowel sounds. No tenderness on light or deep palpation. No hepatomegally or spleenomegally.  Extremities: Without clubbing, cyanosis or edema.   Neurological: No asterixis. Sensation intact. Cranial nerves II - XII grossly intact. Normal gait.   Derm: No jaundice, hives or rashes.    ASA Classification: ASA Class II (a patient with mild systemic disease)    Impression: 57 year old female with a history of rectal bleeding here for diagnostic colonoscopy.    Medical Decision Making: Plan is to perform diagnostic colonoscopy today and interventions which will be needed will be performed at the time of the procedure.

## 2013-09-03 LAB — SURGICAL PATH SPECIMEN

## 2013-09-23 ENCOUNTER — Ambulatory Visit (HOSPITAL_BASED_OUTPATIENT_CLINIC_OR_DEPARTMENT_OTHER): Payer: MEDICARE | Admitting: Gastroenterology

## 2013-09-30 ENCOUNTER — Ambulatory Visit (HOSPITAL_BASED_OUTPATIENT_CLINIC_OR_DEPARTMENT_OTHER): Payer: MEDICARE | Admitting: Gastroenterology

## 2014-02-01 ENCOUNTER — Ambulatory Visit: Payer: Self-pay | Admitting: Podiatry

## 2014-02-01 LAB — XR FOOT LEFT MINIMUM 3 VIEWS

## 2014-06-03 ENCOUNTER — Ambulatory Visit: Payer: Self-pay

## 2014-06-03 LAB — CT ABDOMEN & PELVIS WO IV CONTRAST

## 2014-06-13 ENCOUNTER — Emergency Department (HOSPITAL_BASED_OUTPATIENT_CLINIC_OR_DEPARTMENT_OTHER)
Admission: RE | Admit: 2014-06-13 | Disposition: A | Discharge status: Home / Self Care | Payer: Self-pay | Source: Emergency Department | Attending: Physician Assistant | Admitting: Physician Assistant

## 2014-06-13 ENCOUNTER — Encounter (HOSPITAL_BASED_OUTPATIENT_CLINIC_OR_DEPARTMENT_OTHER): Payer: Self-pay

## 2014-06-13 LAB — XR KNEE RIGHT 3 VIEWS

## 2014-06-13 MED ORDER — ONDANSETRON HCL 4 MG PO TABS
4.00 mg | ORAL_TABLET | Freq: Four times a day (QID) | ORAL | Status: AC | PRN
Start: 2014-06-13 — End: 2014-06-20

## 2014-06-13 MED ORDER — ACETAMINOPHEN 325 MG PO TABS
650.00 mg | ORAL_TABLET | Freq: Once | ORAL | Status: AC
Start: 2014-06-13 — End: 2014-06-13
  Administered 2014-06-13: 650 mg via ORAL
  Filled 2014-06-13: qty 2

## 2014-06-13 NOTE — ED Provider Notes (Signed)
ED nursing record was reviewed. Prior records as available electronically through the Epic record were reviewed.      HPI:    This 58 year old female patient presents to the Emergency Department with chief complaint of right knee pain.  Patient states her right knee began bothering her on Friday, denies any trauma or injury or fall, states she has no previous difficulty with this in the past that she has had a knee replacement of her left knee for osteoarthritis and wear and tear, states cartilage has disappeared.  Patient has not taken any medication for her pain.  She states yesterday evening she got up from the couch and almost buckled underneath her.  Patient reports pain at rest as well, had difficulty sleeping last night.  She denies in [place] or tingling of her extremity.  Pain is mostly located at the medial aspect of the knee.  She denies any fever, chills, night sweats, nausea or vomiting.      ROS: Pertinent positives were reviewed as per the HPI above. All other systems were reviewed and are negative.      Past Medical History/Problem list:    Past Medical History    Back pain     Diabetes     Disorders of lipoid metabolism     HTN (hypertension)     Esophageal reflux     Arthritis     Chronic bronchitis      Patient Active Problem List:     Chest pain     Hypoxia     Diabetes mellitus     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)     Rectal bleeding        Past Surgical History:     Past Surgical History    LAPAROSCOPY SURG CHOLECYSTECTOMY      OB ANTEPARTUM CARE CESAREAN DLVR & POSTPARTUM      ANES NERVE MUSC TENDON FASCIA&BURSA KNEE&/POPLT      TONSILLECTOMY & ADENOIDECTOMY       TOTAL KNEE REPLACEMENT      Comment left         Medications:   No current facility-administered medications on file prior to encounter.  Current Outpatient Prescriptions on File Prior to Encounter:  metoprolol (TOPROL-XL) 25 MG 24 hr tablet Take 0.5 tablets by mouth daily. At bedtime- half pill Disp:  15 tablet Rfl: 12   carisoprodol (SOMA) 350 MG tablet Take 350 mg by mouth 4 (four) times daily as needed. Disp:  Rfl:    simvastatin (ZOCOR) 40 MG tablet Take 40 mg by mouth nightly. Disp:  Rfl:    lidocaine (LIDODERM) 5 % patch Place 1 patch onto the skin daily. Disp:  Rfl:    fluticasone (FLONASE) 50 MCG/ACT nasal spray 1 spray by Each Nostril route daily. Disp:  Rfl:    albuterol (PROVENTIL HFA,VENTOLIN HFA) 108 (90 BASE) MCG/ACT inhaler Inhale 2 puffs into the lungs every 6 (six) hours as needed. Disp:  Rfl:    oxycodone-acetaminophen (PERCOCET) 5-325 MG per tablet Take 1 tablet by mouth every 4 (four) hours as needed. Disp:  Rfl:    albuterol-ipratropium (COMBIVENT) 18-103 MCG/ACT inhaler Inhale 2 puffs into the lungs 4 (four) times daily. Disp: 1 Inhaler Rfl: 1   cholecalciferol (VITAMIN D3) 1000 UNIT tablet Take 1 tablet by mouth daily. Disp: 30 tablet Rfl: 1   aspirin 81 MG tablet Take 81 mg by mouth daily. Disp:  Rfl:    LISINOPRIL OR Take 20  mg by mouth daily. Disp:  Rfl:    METFORMIN HCL OR 2000 mg po daily with dinner Disp:  Rfl:    OMEPRAZOLE OR 20 mg po daily Disp:  Rfl:          Social History:   Social History   Marital Status: Divorced  Spouse Name: N/A    Years of Education: N/A  Number of Children: N/A     Occupational History  None on file     Social History Main Topics   Smoking status: Current Every Day Smoker  0.50 Packs/Day     Smokeless tobacco: Not on file    Alcohol Use: No    Drug Use: No    Sexual Activity: Not on file   Not on file  Other Topics Concern   None on file     Social History Narrative           Allergies:  Review of Patient's Allergies indicates:   Motrin [ibuprofen]      Nausea Only   Bactrim                 Itching   Codeine camsylate             Physical Exam:  BP 117/79   Pulse 96   Temp(Src) 98 F   Resp 20   Wt 89.812 kg (198 lb)   SpO2 98%   LMP 10/22/2007    GENERAL: Well appearing, No acute distress, non-toxic.   SKIN:  Warm & Dry, no erythema or rash.  HEAD:   NCAT. Sclerae are anicteric and aninjected  LUNGS:  Clear to auscultation bilaterally. No wheezes, rales, rhonchi.   HEART:  RRR.  No murmurs, rubs, or gallops.   EXTREMITIES:  No obvious deformities.  CSM intact x 4.     RIGHT KNEE PAIN: No obvious deformity, soft tissue swelling, or ecchymosis present, mild discomfort to medial aspect of knee with palaption, full range of motion of knee.  Gait is stable.  Distal pulse and sensation intact.  NEUROLOGIC:  Alert and oriented x4; moves all extremities well; speaking in clear fluent sentences.   PSYCHIATRIC:  Appropriate for age, time of day, and situation    RESULTS  No results found for this visit on 06/13/14 (from the past 24 hour(s)).        ED Course and Medical Decision-making:  This 58 year old female patient  presents to the Emergency Department with chief complaint of right knee pain.  Signs stable upon presentation.  Physical exam is benign.  Not concerned for acute fracture.  Patient tender to palpation at medial aspect of knee, will obtain imaging for definitive fracture rule out.  Consider arthritis,sprain, less concerned meniscal or ligamentous injury.    Patient has an allergy to Motrin with reaction of "nausea.  " She is therefore given a dose of Tylenol.  Subsequent x-ray without evidence of acute fracture, effusion or dislocation.  Patient is reassured. Ace wrap is placed in she is provided with cane for stability.  She will followup with her PCP for further evaluation this week.  She is instructed to return to Emergency Department if she experiences worsening symptoms, new symptoms fever, vomiting, numbness or tingling of her extremity, or if he is unable to comply with discharge instructions.  Patient understands and agrees with plan.      Condition: stable  Disposition: home      Diagnosis/Diagnoses:  Right knee pain    Altha Harm  Sandrea Hammond, PA-C

## 2014-06-13 NOTE — Narrator Note (Signed)
Patient Disposition    Patient education for diagnosis, medications, activity, diet and follow-up.  Patient left ED 3:21 PM.  Patient rep received written instructions.  Interpreter to provide instructions: No    Have all existing LDAs been addressed? Yes    Have all IV infusions been stopped? Yes    Discharged to: Discharged to home

## 2014-06-13 NOTE — Discharge Instructions (Signed)
DIAGNOSIS & TREATMENT:  You were seen in a [facility] Emergency Department for knee pain. You were treated with a dose of tylenol, ace wrap, and cane    TEST RESULTS:  Your xray did not show acute fracture    FURTHER CARE:  Use ace wrap and cane during the day  Elevate, rest, and ice area    NEW MEDICATIONS:    Take tylenol every 6hrs for pain control    If needed, you may try a dose of motrin and take a dose of prescribed zofran with it to avoid nausea reaction    WHEN SHOULD YOU BE SEEN NEXT?   Please call your doctor and be seen with in the next 5 days for re-evaluation if your symptoms are not improving. If you do not have a primary care doctor or would like to transfer your primary care to [facility], please call [phone] to set one up an appointment.    WHEN SHOULD YOU RETURN TO THE ED?  Please return to the emergency room if you develop fever, increased difficulty breathing, vomiting, you pass out, your symptoms worsen, you get new symptoms or you are unable to schedule follow up care.

## 2014-06-13 NOTE — Narrator Note (Signed)
PA at bedside.

## 2014-06-13 NOTE — ED Triage Note (Signed)
Pt c/o increasing right knee pain for several days. States no known injury. No hx problems with right knee.

## 2015-06-04 ENCOUNTER — Encounter (HOSPITAL_BASED_OUTPATIENT_CLINIC_OR_DEPARTMENT_OTHER): Payer: Self-pay | Admitting: Obstetrics & Gynecology

## 2015-06-04 ENCOUNTER — Encounter (HOSPITAL_BASED_OUTPATIENT_CLINIC_OR_DEPARTMENT_OTHER): Payer: Self-pay

## 2015-06-04 ENCOUNTER — Emergency Department (HOSPITAL_BASED_OUTPATIENT_CLINIC_OR_DEPARTMENT_OTHER)
Admission: RE | Admit: 2015-06-04 | Disposition: A | Discharge status: Home / Self Care | Payer: Self-pay | Source: Emergency Department | Attending: Medical | Admitting: Medical

## 2015-06-04 MED ORDER — OXYCODONE HCL 5 MG PO TABS
5.00 mg | ORAL_TABLET | Freq: Three times a day (TID) | ORAL | 0 refills | Status: AC | PRN
Start: 2015-06-04 — End: 2015-06-06

## 2015-06-04 MED ORDER — ACETAMINOPHEN 500 MG PO TABS
1000.00 mg | ORAL_TABLET | Freq: Once | ORAL | Status: AC
Start: 2015-06-04 — End: 2015-06-04
  Administered 2015-06-04: 1000 mg via ORAL
  Filled 2015-06-04: qty 2

## 2015-06-04 MED ORDER — OXYCODONE HCL 5 MG PO TABS
5.00 mg | ORAL_TABLET | Freq: Once | ORAL | Status: AC
Start: 2015-06-04 — End: 2015-06-04
  Administered 2015-06-04: 5 mg via ORAL
  Filled 2015-06-04: qty 1

## 2015-06-04 NOTE — ED Triage Note (Signed)
Pt. States she thought she had a pimple on her left labia three days ago.  Yesterday the pain increased with increased swelling.

## 2015-06-04 NOTE — Progress Notes (Signed)
59 yo woman with L sided Bartholin's abscess, drained in the [facility] ED on 06/04/15. Word catheter placed. Advised to do sitz baths BID.   Will need follow up in the next week. Patient lives in [place]. Please schedule follow up for her at [facility] if possible in the next week.   TS

## 2015-06-04 NOTE — Narrator Note (Signed)
Patient Disposition    Patient education for diagnosis, medications, activity, diet and follow-up.  Patient left ED 4:12 PM.  Patient rep received written instructions.  Interpreter to provide instructions: No    Patient belongings with patient: YES    Have all existing LDAs been addressed? N/A    Have all IV infusions been stopped? N/A    Discharged to: Discharged to home

## 2015-06-04 NOTE — Narrator Note (Signed)
PA at bedside.

## 2015-06-04 NOTE — Discharge Instructions (Signed)
DIAGNOSIS & TREATMENT:  You were seen in a [facility] Emergency Department for an abscess. This was treated with an Incision & Drainage (I&D).      FURTHER CARE:  External Bandages: Change daily or sooner if dirty    Packing placed - Change bandage daily. Leave the catheter in place and do not tug on it. Sit in warm soapy water 2-3 times a day.    NEW MEDICATIONS:   Oxycodone: This is a narcotic pain medication. Please take as needed for pain. Do not drink or drive while on this medication.     WHEN SHOULD YOU BE SEEN NEXT?  Please call OBGYN department at the number provided if symptoms worsen. They should be calling you this week to schedule a follow up appointment.     If you do not have a primary care doctor or would like to transfer your primary care to [facility], please call [phone] to set one up an appointment.    WHEN SHOULD YOU RETURN TO THE ED?  Please return to the emergency room if you develop fever, vomiting, the wound becomes more painful, angry red, or you have red streaking away from the abscess, your symptoms worsen, you get new symptoms or you are unable to schedule follow up care.       Incision and Drainage of Abscess   (Furuncle, Boil)   An abscess (boil or furuncle) is an area infected by germs that contains a collection of pus. Signs and problems (symptoms) of an abscess include pain, tenderness, redness, or hardness. You may feel a moveable, soft area under your skin. An abscess can occur anywhere in the body. Occasionally, this may spread to surrounding tissues causing cellulitis. Sometimes, a surgeon may make a cut (incision) over your abscess. The pus is drained. Gauze may be packed into the space to provide a drain. Keeping a drain or piece of gauze in the incision keeps the skin from healing first. This helps stop the abscess from forming again. The area may be painful for 5 to 7 days. Most people with an abscess do not have high fevers. If seen early,  your abscess may not have localized and may not be cut. If it does not get better on its own or with medicines, you may require another appointment.   HOME CARE INSTRUCTIONS   Only take over-the-counter or prescription medicines for pain, discomfort, or fever as directed by your caregiver. Use these only if your caregiver has not given medicines that would interfere.   When you bathe, remove the gauze drain after soaking. You may then wash the wound gently with mild, soapy water. Repack with gauze as your caregiver directs.   See your caregiver as directed for a recheck.   If antibiotics were prescribed, take them as directed.   SEEK MEDICAL CARE IF:   You develop increased pain, swelling, redness, drainage, or bleeding in the wound site.   You develop signs of generalized infection, including muscle aches, chills, or a general ill feeling.   You or your child has an oral temperature above 102 F (38.9 C).   MAKE SURE YOU:   Understand these instructions.   Will watch your condition.   Will get help right away if you are not doing well or get worse.   Document Released: 02/24/2003 Document Re-Released: 01/30/2010   [facility] Patient Information 2012 [place].        Bartholin's Cyst or Abscess  Bartholin's glands are  small glands located within the folds of skin (labia) along the sides of the lower opening of the vagina (birth canal). A cyst may develop when the duct of the gland becomes blocked. When this happens, fluid that accumulates within the cyst can become infected. This is known as an abscess. The Bartholin gland produces a mucous fluid to lubricate the outside of the vagina during sexual intercourse.  SYMPTOMS    Patients with a small cyst may not have any symptoms.   Mild discomfort to severe pain depending on the size of the cyst and if it is infected (abscess).   Pain, redness, and swelling around the lower opening of the vagina.   Painful intercourse.   Pressure in the perineal  area.   Swelling of the lips of the vagina (labia).   The cyst or abscess can be on one side or both sides of the vagina.  DIAGNOSIS    A large swelling is seen in the lower vagina area by your caregiver.   Painful to touch.   Redness and pain, if it is an abscess.  TREATMENT    Sometimes the cyst will go away on its own.   Apply warm wet compresses to the area or take hot sitz baths several times a day.   An incision to drain the cyst or abscess with local anesthesia.   Culture the pus, if it is an abscess.   Antibiotic treatment, if it is an abscess.   Cut open the gland and suture the edges to make the opening of the gland bigger (marsupialization).   Remove the whole gland if the cyst or abscess returns.  PREVENTION    Practice good hygiene.   Clean the vaginal area with a mild soap and soft cloth when bathing.   Do not rub hard in the vaginal area when bathing.   Protect the crotch area with a padded cushion if you take long bike rides or ride horses.   Be sure you are well lubricated when you have sexual intercourse.  HOME CARE INSTRUCTIONS    If your cyst or abscess was opened, a small piece of gauze, or a drain, may have been placed in the wound to allow drainage. Do not remove this gauze or drain unless directed by your caregiver.   Wear feminine pads, not tampons, as needed for any drainage or bleeding.   If antibiotics were prescribed, take them exactly as directed. Finish the entire course.   Only take over-the-counter or prescription medicines for pain, discomfort, or fever as directed by your caregiver.  SEEK IMMEDIATE MEDICAL CARE IF:    You have an increase in pain, redness, swelling, or drainage.   You have bleeding from the wound which results in the use of more than the number of pads suggested by your caregiver in 24 hours.   You have chills.   You have a fever.   You develop any new problems (symptoms) or aggravation of your existing condition.  MAKE SURE YOU:     Understand these instructions.   Will watch your condition.   Will get help right away if you are not doing well or get worse.  Document Released: 11/05/2005 Document Revised: 01/28/2012 Document Reviewed: 06/21/2014  [facility] Patient Information 2015 [place], [place]. This information is not intended to replace advice given to you by your health care provider. Make sure you discuss any questions you have with your health care provider.

## 2015-06-07 ENCOUNTER — Encounter (HOSPITAL_BASED_OUTPATIENT_CLINIC_OR_DEPARTMENT_OTHER): Payer: Self-pay | Admitting: Emergency Medicine

## 2015-06-07 ENCOUNTER — Inpatient Hospital Stay (HOSPITAL_BASED_OUTPATIENT_CLINIC_OR_DEPARTMENT_OTHER)
Admission: RE | Admit: 2015-06-07 | Disposition: A | Discharge status: Home / Self Care | Payer: Self-pay | Source: Emergency Department | Attending: Obstetrics & Gynecology | Admitting: Obstetrics & Gynecology

## 2015-06-07 ENCOUNTER — Encounter (HOSPITAL_BASED_OUTPATIENT_CLINIC_OR_DEPARTMENT_OTHER): Payer: Self-pay | Admitting: Obstetrics & Gynecology

## 2015-06-07 ENCOUNTER — Ambulatory Visit (HOSPITAL_BASED_OUTPATIENT_CLINIC_OR_DEPARTMENT_OTHER): Payer: MEDICARE | Admitting: Obstetrics & Gynecology

## 2015-06-07 VITALS — BP 100/76 | Wt 236.0 lb

## 2015-06-07 DIAGNOSIS — N762 Acute vulvitis: Secondary | ICD-10-CM

## 2015-06-07 DIAGNOSIS — N764 Abscess of vulva: Secondary | ICD-10-CM

## 2015-06-07 DIAGNOSIS — N760 Acute vaginitis: Secondary | ICD-10-CM

## 2015-06-07 HISTORY — DX: Abscess of vulva: N76.4

## 2015-06-07 LAB — CBC, PLATELET & DIFFERENTIAL
ABSOLUTE BASO COUNT: 0.1 10*3/uL (ref 0.0–0.1)
ABSOLUTE EOSINOPHIL COUNT: 0.5 10*3/uL (ref 0.0–0.8)
ABSOLUTE IMM GRAN COUNT: 0.03 10*3/uL (ref 0.00–0.03)
ABSOLUTE LYMPH COUNT: 2.7 10*3/uL (ref 0.6–5.9)
ABSOLUTE MONO COUNT: 0.7 10*3/uL (ref 0.2–1.4)
ABSOLUTE NEUTROPHIL COUNT: 5.6 10*3/uL (ref 1.6–8.3)
BASOPHIL %: 0.5 % (ref 0.0–1.2)
EOSINOPHIL %: 4.9 % (ref 0.0–7.0)
HEMATOCRIT: 42.1 % (ref 34.1–44.9)
HEMOGLOBIN: 13.9 g/dL (ref 11.2–15.7)
IMMATURE GRANULOCYTE %: 0.3 % (ref 0.0–0.4)
LYMPHOCYTE %: 28.4 % (ref 15.0–54.0)
MEAN CORP HGB CONC: 33 g/dL (ref 31.0–37.0)
MEAN CORPUSCULAR HGB: 30.4 pg (ref 26.0–34.0)
MEAN CORPUSCULAR VOL: 92.1 fL (ref 80.0–100.0)
MEAN PLATELET VOLUME: 10.4 fL (ref 8.7–12.5)
MONOCYTE %: 7.7 % (ref 4.0–13.0)
NEUTROPHIL %: 58.2 % (ref 40.0–75.0)
PLATELET COUNT: 326 10*3/uL (ref 150–400)
RBC DISTRIBUTION WIDTH STD DEV: 48.8 fL — ABNORMAL HIGH (ref 35.1–46.3)
RBC DISTRIBUTION WIDTH: 14.5 % — ABNORMAL HIGH (ref 11.5–14.3)
RED BLOOD CELL COUNT: 4.57 M/uL (ref 3.90–5.20)
WHITE BLOOD CELL COUNT: 9.6 10*3/uL (ref 4.0–11.0)

## 2015-06-07 LAB — HOLD GREEN TOP TUBE

## 2015-06-07 LAB — LACTIC ACID: LACTIC ACID: 1.9 mmol/L (ref 0.4–2.0)

## 2015-06-07 LAB — HOLD BLUE TOP TUBE

## 2015-06-07 LAB — BASIC METABOLIC PANEL
ANION GAP: 7 mmol/L (ref 5–15)
BUN (UREA NITROGEN): 12 mg/dL (ref 7–18)
CALCIUM: 9.1 mg/dL (ref 8.5–10.1)
CARBON DIOXIDE: 28 mmol/L (ref 21–32)
CHLORIDE: 104 mmol/L (ref 98–107)
CREATININE: 0.6 mg/dL (ref 0.4–1.2)
ESTIMATED GLOMERULAR FILT RATE: >60 mL/min (ref >60)
Glucose Random: 104 mg/dL (ref 74–160)
POTASSIUM: 4.4 mmol/L (ref 3.5–5.1)
SODIUM: 139 mmol/L (ref 136–145)

## 2015-06-07 MED ORDER — CLINDAMYCIN PHOSPHATE IN D5W 900 MG/50ML IV SOLN
900.0000 mg | Freq: Once | INTRAVENOUS | Status: DC
Start: 2015-06-07 — End: 2015-06-07
  Administered 2015-06-07: 900 mg via INTRAVENOUS
  Filled 2015-06-07: qty 50

## 2015-06-07 MED ORDER — METOPROLOL SUCCINATE ER 25 MG PO TB24
12.5000 mg | ORAL_TABLET | Freq: Every day | ORAL | Status: DC
Start: 2015-06-08 — End: 2015-06-08
  Filled 2015-06-07: qty 1

## 2015-06-07 MED ORDER — OXYCODONE HCL 5 MG PO TABS
5.00 mg | ORAL_TABLET | Freq: Once | ORAL | Status: AC
Start: 2015-06-07 — End: 2015-06-07
  Administered 2015-06-07: 5 mg via ORAL
  Filled 2015-06-07: qty 1

## 2015-06-07 MED ORDER — ALBUTEROL SULFATE HFA 108 (90 BASE) MCG/ACT IN AERS
2.0000 | INHALATION_SPRAY | Freq: Four times a day (QID) | RESPIRATORY_TRACT | Status: DC | PRN
Start: 2015-06-07 — End: 2015-06-09
  Filled 2015-06-07: qty 8

## 2015-06-07 MED ORDER — VITAMIN D 1000 UNITS PO TABS
1000.0000 [IU] | ORAL_TABLET | Freq: Every day | ORAL | Status: DC
Start: 2015-06-08 — End: 2015-06-09
  Administered 2015-06-08 – 2015-06-09 (×2): 1000 [IU] via ORAL
  Filled 2015-06-07 (×2): qty 1

## 2015-06-07 MED ORDER — PREGABALIN 100 MG PO CAPS
100.0000 mg | ORAL_CAPSULE | Freq: Two times a day (BID) | ORAL | Status: DC
Start: 2015-06-07 — End: 2015-06-08
  Administered 2015-06-07: 100 mg via ORAL
  Filled 2015-06-07 (×2): qty 1

## 2015-06-07 MED ORDER — ACETAMINOPHEN 325 MG PO TABS
650.0000 mg | ORAL_TABLET | ORAL | Status: DC | PRN
Start: 2015-06-07 — End: 2015-06-09
  Administered 2015-06-07 – 2015-06-08 (×3): 650 mg via ORAL
  Filled 2015-06-07 (×3): qty 2

## 2015-06-07 MED ORDER — SODIUM CHLORIDE 0.9 % IV SOLN
5.0000 mg/kg | INTRAVENOUS | Status: DC
  Filled 2015-06-07: qty 9

## 2015-06-07 MED ORDER — CLINDAMYCIN PHOSPHATE IN D5W 900 MG/50ML IV SOLN
900.00 mg | Freq: Three times a day (TID) | INTRAVENOUS | Status: AC
Start: 2015-06-08 — End: 2015-06-08
  Administered 2015-06-08 (×3): 900 mg via INTRAVENOUS
  Filled 2015-06-07 (×3): qty 50

## 2015-06-07 MED ORDER — ASPIRIN EC 81 MG PO TBEC
81.0000 mg | DELAYED_RELEASE_TABLET | Freq: Every day | ORAL | Status: DC
Start: 2015-06-08 — End: 2015-06-09
  Administered 2015-06-08 – 2015-06-09 (×2): 81 mg via ORAL
  Filled 2015-06-07 (×2): qty 1

## 2015-06-07 MED ORDER — SODIUM CHLORIDE 0.9 % IV BOLUS
1000.0000 mL | Freq: Once | INTRAVENOUS | Status: AC
Start: 2015-06-07 — End: 2015-06-07
  Administered 2015-06-07: 1000 mL via INTRAVENOUS

## 2015-06-07 MED ORDER — PRAVASTATIN SODIUM 40 MG PO TABS
80.0000 mg | ORAL_TABLET | Freq: Every day | ORAL | Status: DC
Start: 2015-06-08 — End: 2015-06-09
  Administered 2015-06-08: 80 mg via ORAL
  Filled 2015-06-07: qty 2

## 2015-06-07 MED ORDER — SODIUM CHLORIDE 0.9 % IV SOLN
5.0000 mg/kg | Freq: Once | INTRAVENOUS | Status: DC
  Administered 2015-06-07: 360 mg via INTRAVENOUS
  Filled 2015-06-07: qty 9

## 2015-06-07 MED ORDER — METFORMIN HCL 500 MG PO TABS
2000.0000 mg | ORAL_TABLET | Freq: Every day | ORAL | Status: DC
Start: 2015-06-08 — End: 2015-06-08

## 2015-06-07 MED ORDER — LISINOPRIL 20 MG PO TABS
20.0000 mg | ORAL_TABLET | Freq: Every day | ORAL | Status: DC
Start: 2015-06-08 — End: 2015-06-09
  Administered 2015-06-09: 20 mg via ORAL
  Filled 2015-06-07 (×2): qty 1

## 2015-06-07 MED ORDER — ONDANSETRON 4 MG PO TBDP
4.0000 mg | ORAL_TABLET | Freq: Four times a day (QID) | ORAL | Status: DC | PRN
Start: 2015-06-07 — End: 2015-06-09

## 2015-06-07 MED ORDER — CLINDAMYCIN HCL 300 MG PO CAPS
300.0000 mg | ORAL_CAPSULE | Freq: Three times a day (TID) | ORAL | 0 refills | Status: DC
Start: 2015-06-07 — End: 2015-06-07

## 2015-06-07 NOTE — Narrator Note (Signed)
Patient Disposition    Patient education for diagnosis, medications, activity, diet and follow-up.  Patient left ED 8:13 PM.  Patient rep received written instructions.  Interpreter to provide instructions: No    Patient belongings with patient: YES    Have all existing LDAs been addressed? No    Have all IV infusions been stopped? No    Discharged to: Admit to:  Medical/surgical unit with transport.  [place].

## 2015-06-07 NOTE — ED Triage Note (Signed)
Bartholin cyst I&D at [facility] 3 days ago. Drain removed today in clinic. Referred to ED due to not feeling well, Chills, pain.   Tachycardic during triage. Afebrile.

## 2015-06-07 NOTE — Narrator Note (Signed)
Eval by GYN

## 2015-06-07 NOTE — Progress Notes (Signed)
S/  She thought she had a heat rash which brought her to the ED.  She felt a pimple which got larger and became very painful.  The pain travels up to her left groin.  She has been experiencing chills off and on for the past 4 days.  She did not check her temperature.  In the ED the pimple was drained and a catheter placed.  This did not help the pain.  She was not doing sitz baths because she does not know what they are.    Her last pap smear was done two years ago in [place] and she believes it was normal. She never received a letter either way.  She is not sure if an HPV was also.  She plans to get her records and see her PCP.    Review of Patient's Allergies indicates:   Motrin [ibuprofen]      Nausea Only   Bactrim                 Itching   Codeine camsylate           Current Outpatient Prescriptions on File Prior to Visit:  [EXPIRED] oxyCODONE (ROXICODONE) 5 MG immediate release tablet Take 1 tablet by mouth every 8 (eight) hours as needed for Pain Max Daily Amount: 15 mg Disp: 6 tablet Rfl: 0   metoprolol (TOPROL-XL) 25 MG 24 hr tablet Take 0.5 tablets by mouth daily. At bedtime- half pill Disp: 15 tablet Rfl: 12   fluticasone (FLONASE) 50 MCG/ACT nasal spray 1 spray by Each Nostril route daily. Disp:  Rfl:    albuterol (PROVENTIL HFA,VENTOLIN HFA) 108 (90 BASE) MCG/ACT inhaler Inhale 2 puffs into the lungs every 6 (six) hours as needed. Disp:  Rfl:    oxycodone-acetaminophen (PERCOCET) 5-325 MG per tablet Take 1 tablet by mouth every 4 (four) hours as needed. Disp:  Rfl:    albuterol-ipratropium (COMBIVENT) 18-103 MCG/ACT inhaler Inhale 2 puffs into the lungs 4 (four) times daily. Disp: 1 Inhaler Rfl: 1   cholecalciferol (VITAMIN D3) 1000 UNIT tablet Take 1 tablet by mouth daily. Disp: 30 tablet Rfl: 1   aspirin 81 MG tablet Take 81 mg by mouth daily. Disp:  Rfl:    LISINOPRIL OR Take 20 mg by mouth daily. Disp:  Rfl:    METFORMIN HCL OR 2000 mg po daily with dinner Disp:  Rfl:    OMEPRAZOLE  OR 20 mg po daily Disp:  Rfl:      No current facility-administered medications on file prior to visit.   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)      Past Medical History    Arthritis     Back pain     Chronic bronchitis     Diabetes     Disorders of lipoid metabolism     Esophageal reflux     HTN (hypertension)        Past Surgical History    LAPAROSCOPY SURG CHOLECYSTECTOMY      OB ANTEPARTUM CARE CESAREAN DLVR & POSTPARTUM      ANES NERVE MUSC TENDON FASCIA&BURSA KNEE&/POPLT      TONSILLECTOMY & ADENOIDECTOMY  TOTAL KNEE REPLACEMENT      Comment left       Social History   Marital status: Divorced  Spouse name: N/A    Years of education: N/A  Number of children: N/A     Occupational History  None on file     Social History Main Topics   Smoking status: Current Every Day Smoker  0.50 Packs/day     Smokeless tobacco: Not on file    Alcohol use No    Drug use: No    Sexual activity: Not on file     Other Topics Concern   None on file     Social History Narrative       Family History    Diabetes Mother     Heart Brother     Comment: CAD and AAA    Hypertension Mother     Arthritis Mother     Diabetes Brother     Comment: same brother     O/  VS reviewed  T= 97.5  BMI = 43.16    External genitalia:  There was a word catheter in an abscess cavity of the left labium majora.  After removing the catheter an approximately 1 x 1 cavity with surrounding induration was noted.    A/  59 Y/O morbidly obese smoker with a labial abscess and surrounding cellulitis.      P/  Discussed the options of a trial of out patient antibiotics with F/U next week vs possible admission for antibiotics and pain control.  The pt has opted to go to the ED for further evaluation and treatment.    The ED attending and gyn on call were notified.    I have spent 30  minutes in face to face time with this patient/patient proxy of which > 50% was in counseling or coordination of care regarding above issues/Dx.

## 2015-06-07 NOTE — H&P (Signed)
-   GYN ADMISSION H&P NOTE -    Chief Complaint: Left labial pain       HPI:  Pt reports labial pain x 3 days, s/p I&D on 7/16 with word catheter placed. Reports no hx of previous abscess. Seen for f/u today, +cellulitis, reported chills. States chills x24hrs, hasn't taken temperature. Denies HA, dizziness, lightheadedness. Reports no change in appetite although didn't eat today, no N/V.    OB/GYN Hx: Postmenopausal, G3P3-c-section x3, reports no GYN hx.     Past Medical History:     Past Medical History    Arthritis     Back pain     Chronic bronchitis     Diabetes     Disorders of lipoid metabolism     Esophageal reflux     HTN (hypertension)        Past Surgical History:     Past Surgical History    LAPAROSCOPY SURG CHOLECYSTECTOMY      OB ANTEPARTUM CARE CESAREAN DLVR & POSTPARTUM      ANES NERVE MUSC TENDON FASCIA&BURSA KNEE&/POPLT      TONSILLECTOMY & ADENOIDECTOMY       TOTAL KNEE REPLACEMENT      Comment left     Allergies:   Review of Patient's Allergies indicates:   Motrin [ibuprofen]      Nausea Only   Bactrim                 Itching   Codeine camsylate           Tobacco Use:     Smoking status: Current Every Day Smoker 0.50 Packs/day       Alcohol:     Alcohol use No       Family History:     Family History    Diabetes Mother     Heart Brother     Comment: CAD and AAA    Hypertension Mother     Arthritis Mother     Diabetes Brother     Comment: same brother       Review of Systems: all negative except as stated in HPI      Vital Signs - Last 24 Hours:   BP: (100-126)/(72-76)   Temp:  [98.1 F (36.7 C)]   Pulse:  [111]   Resp:  [18]   SpO2: --    Vital Signs - Last 8 Hours:   BP: (100-126)/(72-76)   Temp:  [98.1 F (36.7 C)]   Pulse:  [111]   Resp:  [18]   SpO2: --     Physical Exam:  Gen: NAD  CV: tachycardic, no M/R/G  Pulm: CTAB  ABD: obese, soft, ND, no guarding, rigidity  Pelvic: left labial abscess 4x3x3 area induration, with surrounding cellulitis, +discharge from I&D site, culture  obtained    Labs: CBC, CMP pending    Assessment/Plan: 59yo G3P3 with left labial abscess, cellulitis  - Abscess: draining well, cont to monitor, reassess for further I&D prn  - Cellulitis: afebrile, CBC and wound culture pending, IV Gentamycin, Clindamycin  - DM: cont Metformin, FS qid  - CV: +tachycardia-nl for pt, EKG prn, cont lisinopril. Metoprolol, baby ASA, atorvastatin  - Reassess prn  Dr Gwenlyn Found aware of exam and plan  Charlynne Pander, [facility], 06/07/2015, 6:42 PM       Pager [phone]

## 2015-06-07 NOTE — ED Notes (Signed)
Bed: 12  Expected date: 06/07/15  Expected time:   Means of arrival:   Comments:  Samantha Cameron MRN [id number] being called in by Dr. Philip Aspen - gyn at [facility] aware. Labial abscess. Call GYN when patient arrives

## 2015-06-07 NOTE — ED Provider Notes (Signed)
I have reviewed the ED nursing notes and prior records. I have reviewed the patient's past medical history/problem list, allergies, social history and medication list.  I saw this patient primarily.    HPI:  This is a 59 year old female patient presenting with a chief complaint of left vulvar pain gradually worsening over the past week.  She was seen at [facility] emergency Department 3 days ago and had a drainage of the abscess, but she reports that the pain has worsened and is spreading now into the left inguinal region.  Also, in the past 24 hours she has started to feel chills and subjective fevers.  No cough or trouble breathing or chest pain.  No upper abdominal pain or nausea or vomiting.  No urinary symptoms or change in bowels.  Her sugar was in the 100s this morning.  No history of similar.  Sxs constant, gradual onset, better with percocet at home.  Saw GYN today outpt and sent to ED given her worsening sxs.    ROS: Pertinent positives were reviewed as per the HPI above. All other systems were reviewed and are negative.    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)     Left genital labial abscess      Past Surgical History:    Past Surgical History    LAPAROSCOPY SURG CHOLECYSTECTOMY      OB ANTEPARTUM CARE CESAREAN DLVR & POSTPARTUM      ANES NERVE MUSC TENDON FASCIA&BURSA KNEE&/POPLT      TONSILLECTOMY & ADENOIDECTOMY       TOTAL KNEE REPLACEMENT      Comment left       Medications:     Current Facility-Administered Medications:  [START ON 06/08/2015] clindamycin (CLEOCIN) IVPB 900 mg premixed 900 mg Intravenous Q8H Kristen Chase    [START ON 06/08/2015] gentamicin 360 mg in sodium chloride 0.9% 100  mL IVPB 5 mg/kg (Adjusted) Intravenous Q24H Kristen Chase    albuterol (PROVENTIL HFA,VENTOLIN HFA, PROAIR HFA) inhaler 2 puff 2 puff Inhalation Q6H PRN Kristen Chase    [START ON 06/08/2015] aspirin EC EC tablet 81 mg 81 mg Oral Daily Kristen Chase    [START ON 06/08/2015] pravastatin (PRAVACHOL) tablet 80 mg 80 mg Oral Daily after dinner Kristen Chase    [START ON 06/08/2015] cholecalciferol (VITAMIN D3) tablet 1,000 Units 1,000 Units Oral Daily Kristen Chase    [START ON 06/08/2015] lisinopril (PRINIVIL,ZESTRIL) tablet 20 mg 20 mg Oral Daily Kristen Chase    [START ON 06/08/2015] metFORMIN (GLUCOPHAGE) tablet 2,000 mg 2,000 mg Oral Daily with dinner Charlynne Pander    [START ON 06/08/2015] metoprolol (TOPROL-XL) 24 hr tablet 12.5 mg 12.5 mg Oral Daily Kristen Chase    pregabalin (LYRICA) capsule 100 mg 100 mg Oral BID Kristen Chase 100 mg at 06/07/15 2112   acetaminophen (TYLENOL) tablet 650 mg 650 mg Oral Q4H PRN Kristen Chase 650 mg at 06/07/15 2154   ondansetron (ZOFRAN-ODT) disintegrating tablet 4 mg 4 mg Oral Q6H PRN Charlynne Pander  Social History:     Smoking status: Current Every Day Smoker  0.50 Packs/day  For 31.00 Years     Smokeless tobacco: Not on file    Comment: quit smoking inform provided to patient    Alcohol use No       Allergies:  Review of Patient's Allergies indicates:   Motrin [ibuprofen]      Nausea Only   Bactrim                 Itching   Codeine camsylate           Physical Exam:   06/07/15  1738 06/07/15  1942 06/07/15  2022   BP: 126/72  117/70   Pulse: 111 101 93   Resp: 18  20   Temp: 98.1 F  97.9 F   TempSrc:   Oral   SpO2:  100% 98%   Weight: 107 kg (236 lb)  106.2 kg (234 lb 1.6 oz)   Height: 1.57 m (5' 1.81")  1.6 m (5\' 3" )       GEN: No acute distress, appears comfortable  HEENT: Atraumatic, normocephalic.  PERRL, EOMI.  Conjunctiva clear.  Oropharynx with moist mucus membranes.  Neck supple.  CARDIOVASC: Regular rate and rhythm, no murmurs, rubs, and gallops.  No peripheral  edema.  RESP: No respiratory distress, good air exchange, breath sounds CTAB.  ABD: Soft, nontender, nondistended, nl bowel sounds. No organomegaly noted.  GU:  The site of prior drainage on left vulva is still open ~2 mm, no drainage expressed, + surrounding induration and tenderness, no obvious fluctuance remains  NEURO: Alert and oriented, CN III-XII grossly nl, MAEx4  PSYCH: Appropriate affect and thought  SKIN: Warm, dry, no rashes or bruising     ED Course and Medical Decision-making:    The patient is a 59 year old female with worsening pain and subjective fever and chills after drainage of a vulvar abscess 3 days ago.  GYN was consulted and they would like to bring the patient in for intravenous antibiotics, which were started in the emergency department.  Reassuring that her labs are normal, including a normal lactate and white blood cell count.  I do think she is appropriate for floor management.      Disposition:  Admit to:  Medical/surgical unit with transport    Condition on  Admission:  Improved and Stable    Diagnosis/Diagnoses:  Abscess of vulva  (primary encounter diagnosis)      Lendell Caprice, MD

## 2015-06-07 NOTE — Narrator Note (Signed)
Report given to 6N

## 2015-06-08 MED ORDER — OXYCODONE HCL 5 MG PO TABS
5.0000 mg | ORAL_TABLET | Freq: Four times a day (QID) | ORAL | Status: DC | PRN
Start: 2015-06-08 — End: 2015-06-09
  Administered 2015-06-08 – 2015-06-09 (×2): 5 mg via ORAL
  Filled 2015-06-08 (×2): qty 1

## 2015-06-08 MED ORDER — CLINDAMYCIN HCL 150 MG PO CAPS
600.0000 mg | ORAL_CAPSULE | Freq: Three times a day (TID) | ORAL | Status: DC
Start: 2015-06-09 — End: 2015-06-09
  Administered 2015-06-09 (×2): 600 mg via ORAL
  Filled 2015-06-08 (×2): qty 4

## 2015-06-08 MED ORDER — PREGABALIN 100 MG PO CAPS
100.0000 mg | ORAL_CAPSULE | Freq: Every day | ORAL | Status: DC
Start: 2015-06-08 — End: 2015-06-09
  Administered 2015-06-08: 100 mg via ORAL
  Filled 2015-06-08: qty 1

## 2015-06-08 MED ORDER — METOPROLOL SUCCINATE ER 25 MG PO TB24
12.5000 mg | ORAL_TABLET | Freq: Every day | ORAL | Status: DC
Start: 2015-06-08 — End: 2015-06-09
  Administered 2015-06-08: 12.5 mg via ORAL
  Filled 2015-06-08 (×2): qty 1

## 2015-06-08 MED ORDER — METFORMIN HCL 500 MG PO TABS
1000.0000 mg | ORAL_TABLET | Freq: Two times a day (BID) | ORAL | Status: DC
Start: 2015-06-08 — End: 2015-06-09
  Administered 2015-06-08: 1000 mg via ORAL
  Filled 2015-06-08 (×2): qty 2

## 2015-06-08 NOTE — RN Shift Note (Signed)
Recd pt in bed Alert left labia slightly reddened with slight redness top inner aspect left groin Tol diet Amb to br to void instructed to use peri bottle after vdg and to wash hands after vdg up ad lib in room

## 2015-06-08 NOTE — Discharge Summary (Signed)
Physician Discharge Summary     Patient ID:  Samantha Cameron  [id number]  59 year old  1956/06/20    Admit date: 06/07/2015  6:29 PM    Discharge date and time: 06/09/15 at [place]     Admitting Physician: Willis Modena     Discharge Physician: Dirk Dress    Discharge Diagnoses: left vulvar abscess    Admission Condition: good    Discharged Condition: good    Indication for Admission: IV antibiotics    Hospital Course: [name] was admitted to the gynecology service for observation and IV antibiotics with a left labial abscess. She was transitioned to oral antibiotics after 24 hours and showed improvement and was discharged home with continued antibiotics and outpatient followup scheduled .    Consults: none    Significant Diagnostic Studies:   microbiology: wound culture: preliminary result:    MANY POLYMORPHONUCLEAR LEUKOCYTES   MANY GRAM POSITIVE COCCI IN PAIRS AND IN CLUSTERS   MODERATE GRAM POSITIVE BACILLI    Treatments: antibiotics: gentamicin and clindamycin    Discharge Exam:  Gen: NAD  CV: RRR  Pulm: CTAB, normal work of breathing  Abd: obese, soft, nontender  GU: left labial/vulvar abscess approx 2 o'clock with 41mm opening without drainage. No fluctuance, minimal erythema of skin. Grape-sized induration, slightly TTP.     Disposition: stable to home    Patient Instructions:   Current Discharge Medication List    CONTINUE these medications which have NOT CHANGED    atorvastatin (LIPITOR) 20 MG tablet  Take 1 tablet by mouth daily    pregabalin (LYRICA) 100 MG capsule  Take 1 capsule by mouth 2 (two) times daily    metoprolol (TOPROL-XL) 25 MG 24 hr tablet  Take 0.5 tablets by mouth daily. At bedtime- half pill  Qty: 15 tablet Refills: 12    fluticasone (FLONASE) 50 MCG/ACT nasal spray  1 spray by Each Nostril route daily.    albuterol (PROVENTIL HFA,VENTOLIN HFA) 108 (90 BASE) MCG/ACT inhaler  Inhale 2 puffs into the lungs every 6 (six) hours as needed.    oxycodone-acetaminophen (PERCOCET) 5-325 MG per  tablet  Take 1 tablet by mouth every 4 (four) hours as needed.    albuterol-ipratropium (COMBIVENT) 18-103 MCG/ACT inhaler  Inhale 2 puffs into the lungs 4 (four) times daily.  Qty: 1 Inhaler Refills: 1    cholecalciferol (VITAMIN D3) 1000 UNIT tablet  Take 1 tablet by mouth daily.  Qty: 30 tablet Refills: 1    aspirin 81 MG tablet  Take 81 mg by mouth daily.    LISINOPRIL OR  Take 20 mg by mouth daily.    METFORMIN HCL OR  2000 mg po daily with dinner    OMEPRAZOLE OR  20 mg po daily      STOP taking these medications    oxyCODONE (ROXICODONE) 5 MG immediate release tablet        Activity: activity as tolerated  Diet: regular diet  Wound Care: keep wound clean and dry    [place] Future Appointments:  Current and Future Appointments at [facility] (90 Days)                  07/18/2015  1:30 PM OFFICE NEW 40 (40 min.) [facility] [[id number]] Christopher P. [name], MD          Follow-up with:  Dr. Liane Comber 06/16/15 at 02:45pm      Code Status during hospital stay: Full Code  Signed:  Briscoe Burns  06/09/2015  12:28pm

## 2015-06-08 NOTE — Plan of Care (Addendum)
Problem: Daily Care  Goal: Daily care needs are met  Assess and monitor ability to perform self care and identify potential discharge needs.  Outcome: Progressing  Patient is able to ambulate to BR by self. Good hand hygiene and peri care, using peri bottle with warm water and keep area dry provided. Left labia red and firm. No drainage noted .     Problem: Pain  Goal: Patients pain/discomfort is manageable  Assess and monitor patients pain using appropriate pain scale. Collaborate with interdisciplinary team and initiate plan and interventions as ordered. Re-assess patients pain level 30 - 60 minutes after pain management intervention.   Outcome: Progressing  Tylenol 650 mg given for pain. Pain dec from 6/10 to 3/10. Enc patient to report if pain recurs.    0200: BP low . Rechecked manually. SBP > 100 both arms. ( see flow )  Denies dizziness or lightheadedness. Afebrile.

## 2015-06-08 NOTE — Discharge Instructions (Signed)
.  Vulvar Abscess   The vulva is made up of the large and small flaps of skin around the vagina opening. A vulvar abscess is an infected sac of yellowish white fluid (pus) in the skin flaps. Your doctor may make a small cut in the skin to drain the vulvar abscess.   HOME CARE   Only take medicine as told by your doctor.   Soak or take a warm water bath (sitz bath) 3 to 4 times a day for 15 to 20 minutes.   After you pee (urinate), always wipe from front to back.   Clean the vulvar abscess with soap and warm water. Do this after going to the bathroom.   Wear loose-fitting clothing.   Do not have sex until the vulvar abscess is gone or as told by your doctor.  GET HELP RIGHT AWAY IF:    You have a temperature by mouth above 102 F (38.9 C).   The vulva area becomes more painful, puffy (swollen), or red.   You have fluid coming from the vulva area that is red or tan.   You have pain when you pee or have a hard time peeing.  MAKE SURE YOU:   Understand these instructions.   Will watch your condition.   Will get help if you are not doing well or get worse.  Document Released: 02/01/2009 Document Revised: 01/28/2012 Document Reviewed: 02/01/2009  Exit[employer] Patient Information 2015 [employer]. This information is not intended to replace advice given to you by your health care provider. Make sure you discuss any questions you have with your health care provider.

## 2015-06-08 NOTE — Progress Notes (Signed)
Alert / oriented, pleasant, no iv access, c/o groin pain 2/10, declines analgesics.

## 2015-06-08 NOTE — Progress Notes (Signed)
Gyn Progress Note    Subjective:  Interval History: No acute events. Feels that pain is still present but slightly improved since yesterday.   Patient has no complaint of fevers, chills, chest pain, shortness of breath, nausea, vomiting or diarrhea since admission.     Scheduled Meds:   pregabalin  100 mg Oral Daily    metoprolol  12.5 mg Oral Daily    metFORMIN  1,000 mg Oral BID WC    clindamycin (CLEOCIN) IV  900 mg Intravenous Q8H    gentamicin  5 mg/kg (Adjusted) Intravenous Q24H    aspirin EC  81 mg Oral Daily    pravastatin  80 mg Oral Daily after dinner    cholecalciferol  1,000 Units Oral Daily    lisinopril  20 mg Oral Daily     Continuous Infusions:   PRN Meds:albuterol, acetaminophen, ondansetron    Objective:    Vital signs in last 24 hours:  Temp:  [97.6 F (36.4 C)-98.1 F (36.7 C)] 98.1 F (36.7 C)  Pulse:  [80-111] 80  Resp:  [18-20] 20  BP: (91-126)/(52-78) 91/65    Intake/Output last 3 shifts:  I/O last 3 completed shifts:  In: 460 [P.O.:360; IV Piggyback:100]  Out: 900 [Urine:900]  Intake/Output this shift:  I/O this shift:  In: 87 [P.O.:480]  Out: 62 [Urine:700]    Gen:NAD  CV: RRR  Pulm: normal work of breathing  Abd: obese, nondistended  GU: L labium majus abscess with 43mm incisional opening approx 2 o'clock with grape-sized induration under skin surface, no obvious fluctuance. +TTP, no fluid expressed. Skin surrounding nontender, minimal erythema    Assessment/Plan:    Principal Problem:    Left genital labial abscess (06/07/2015)    59yo diabetic female with L labial abscess approx 2 o'clock position on majorum, s/p I&D with word catheter placement on 7/16 s/p removal in office 7/19 for concern for worsening infection, currently admitted for observation with IV antibiotics.     *) L labial abscess  - continue gent/clinda for at least 24 hours  - continue to monitor progress of healing   - oxycodone and lyrica prn for pain  - consider repeat I&D if no improvement    *) DM:  continue metformin  *) HTN: continue home asa, metoprolol, lisinopril  *) HLD: continue pravastatin    Briscoe Burns PGY3  Pager [phone]

## 2015-06-08 NOTE — Progress Notes (Addendum)
Patient daughter " Samantha Cameron " called. Stated that she is patient Healthcare proxy sighed at [place] to locate HCP form in epic. Patient was unable to recall when HCP form was done but did give permission to this RN to give information to her daughter. Update status provided to daughter including Medications and name of attending if she has further questions / concerns.

## 2015-06-08 NOTE — Plan of Care (Signed)
Problem: Pain  Goal: Patient’s pain/discomfort is manageable  Assess and monitor patient’s pain using appropriate pain scale. Collaborate with interdisciplinary team and initiate plan and interventions as ordered. Re-assess patient’s pain level 30 - 60 minutes after pain management intervention.   Pt encouraged to monitor pain and request pain meds as needed

## 2015-06-09 LAB — BLOOD SUGAR FINGERSTICK (POINT OF CARE): FINGERSTICK GLUCOSE: 100 mg/dl (ref 74–160)

## 2015-06-09 MED ORDER — CLINDAMYCIN HCL 300 MG PO CAPS
600.00 mg | ORAL_CAPSULE | Freq: Three times a day (TID) | ORAL | 0 refills | Status: AC
Start: 2015-06-09 — End: 2015-06-16

## 2015-06-09 NOTE — Plan of Care (Signed)
Problem: Pain  Goal: Patient's pain/discomfort is manageable  Assess and monitor patient's pain using appropriate pain scale. Collaborate with interdisciplinary team and initiate plan and interventions as ordered. Re-assess patient's pain level 30 - 60 minutes after pain management intervention.   Outcome: Progressing    Problem: Knowledge Deficit  Goal: Patient/S.O. demonstrates understanding of disease process, treatment plan, medications, and discharge instructions.  Complete learning assessment and assess knowledge base   Outcome: Progressing    Comments:   Pt. Pleasant & cooperative, left labia with slightly firm area, skin intact, verbalized understanding of pain management, explained new antibiotic order, pt. Sleeping in long naps, in no distress.

## 2015-06-09 NOTE — ED Provider Notes (Signed)
eMERGENCY dEPARTMENT Physician Assistant NOTE    The ED nursing record was reviewed.   The prior medical records as available electronically through Epic were reviewed.  The mode of arrival was Self on 06/04/2015  1:30 PM.    CHIEF COMPLAINT    Patient presents with:  Vaginal Problem      HPI    Samantha Cameron is a 59 year old female who presented to the emergency department with a chief complaint of left sided  Vaginal pain. Patient states it started small but has progressively gotten worse. She initially thought was a pimple but it has gotten larger.She has not had any drainage from the area. She has not had any fevers or chills with this, no nausea or vomiting. She is unsure of her last papsmear. She does not feel she is at  riskfor STDs. No chest pain or SOB.      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   No current outpatient prescriptions on file.    Facility-Administered Medications Ordered in Other Encounters:   .  pregabalin (LYRICA) capsule 100 mg, 100 mg, Oral, Daily, Valeda Malm, MD, 100 mg at 06/08/15 2134  .  metoprolol (TOPROL-XL) 24 hr tablet 12.5 mg, 12.5 mg, Oral, Daily, Valeda Malm, MD, 12.5 mg at 06/08/15 2133  .  metFORMIN (GLUCOPHAGE) tablet 1,000 mg, 1,000 mg, Oral, BID WC, Valeda Malm, MD, 1,000 mg at 06/08/15 1724  .  oxyCODONE (ROXICODONE) IR tablet 5 mg, 5 mg, Oral, Q6H PRN, Valeda Malm, MD, 5 mg at 06/08/15 2134  .  clindamycin (CLEOCIN) capsule 600 mg, 600 mg, Oral, Q8H [place], Willis Modena, 600 mg at 06/09/15 0216  .  albuterol (PROVENTIL HFA,VENTOLIN HFA, PROAIR HFA) inhaler 2 puff, 2 puff, Inhalation, Q6H PRN, Charlynne Pander  .  aspirin EC EC tablet 81 mg, 81 mg, Oral, Daily, Kristen Chase, 81 mg at 06/08/15 0912  .  pravastatin (PRAVACHOL) tablet 80 mg, 80 mg, Oral, Daily after dinner, Kristen Chase, 80 mg at 06/08/15 1724  .  cholecalciferol (VITAMIN D3) tablet 1,000 Units, 1,000 Units, Oral, Daily, Kristen Chase, 1,000 Units at 06/08/15 0912  .  lisinopril (PRINIVIL,ZESTRIL) tablet 20 mg, 20 mg, Oral, Daily, Valeda Malm, MD, 20 mg at 06/08/15 1034  .  acetaminophen (TYLENOL) tablet 650 mg, 650 mg, Oral, Q4H PRN, Kristen Chase, 650 mg at 06/08/15 2134  .  ondansetron (ZOFRAN-ODT) disintegrating tablet 4 mg, 4  mg, Oral, Q6H PRN, Kristen Chase    ALLERGIES    Review of Patient's Allergies indicates:   Motrin [ibuprofen]      Nausea Only   Bactrim                 Itching   Codeine camsylate           FAMILY HISTORY      Family History    Diabetes Mother     Heart Brother     Comment: CAD and AAA    Hypertension Mother     Arthritis Mother     Diabetes Brother     Comment: same brother       SOCIAL HISTORY    Social History    Marital status: Divorced            Spouse name:                       Years of education:                 Number of children:               Social History Main Topics    Smoking status: Current Every Day Smoker                                                     Packs/day: 0.50      Years: 31.00       Comment: quit smoking inform provided to patient    Alcohol use: No              Drug use: No                REVIEW OF SYSTEMS     The pertinent positives are reviewed in the HPI above. All other systems were reviewed and are negative.    PHYSICAL EXAM      Vital  Signs: BP 109/71  Pulse 98  Temp 97.9 F  Resp 20  Wt 105.7 kg (233 lb)  LMP 10/22/2007  SpO2 97%  BMI 42.62 kg/m2   Pain Score: Pain Score: 4 (4/10)  Constitutional: Well-developed, Well-nourished, Non-toxic appearance. Speaking full sentences.  EYES: Pupils are equal and reactive. Extraocular movements are intact. No scleral icterus.   ENT: Clear. Mucus membranes are moist.   LYMPHATICS: No palpable cervical lymphadenopathy.   CV: RRR, No MRG, radial pulses 2+ B/L  PULMONARY:  CTAB, No WRR, No stridor, accessory muscle use or tripoding  ABDOMINAL: Soft, NTND, No rebound, guarding or masses, No murphy's, mcburney's or rovsing's.   GENITOURINARY: No CVA tenderness.   PELVIC: large abscess to left labia majora w/ surrounding cellulitis.  MUSCULOSKELETAL : Moving all 4 extremities. Ambulatory w/ a steady gait. The spine straight and nontender.     SKIN: Warm and dry, no rash . The skin color and turgor are normal.     NEUROLOGIC: Normal mental status.     PSYCHIATRIC: Normal affect      RESULTS      PROCEDURES  Incision & Drainage:  Indication: 4 cm abscess on left labia majora    Consent:  The procedure was explained to the patient/family and verbal informed consent obtained.     Prep:  The skin was cleaned with chlorohexadine scrub  Anesthesia:  5 ml of 1% lidocaine w/o epinephrine by local infiltration and anesthetic effect was tested and found to be sufficient.    Technique:  Under good anesthesia. A #11 scalpel blade was used to create a simple linear incision which was carried for approximately 1 cm. A hemostat was used to to dissect the cavity and break up loculations. The cavity was explored wall to wall to ensure complete loculation break up. Approximately 5ml of pus was expressed. The wound was irrigated with normal saline or sterile water.    A ward catheter placed leaving enough externalized to facilitate removal.  Patient tolerated the procedure well.    Complications:  None      MEDICATIONS  ADMINISTERED ON THIS VISIT    Medication Orders Placed This Encounter      oxyCODONE (ROXICODONE) IR tablet 5 mg      acetaminophen (TYLENOL) tablet 1,000 mg      oxyCODONE (ROXICODONE) 5 MG immediate release tablet          Sig: Take 1 tablet by mouth every 8 (eight) hours as needed for Pain Max Daily Amount: 15 mg          Dispense:  6 tablet          Refill:  0    ED COURSE & MEDICAL DECISION MAKING      I reviewed the patient's past medical history/problem list, past surgical history, medication list, social history and allergies.    Arrival: Pt arrived in stable condition and required no immediate interventions.    ED Decision Making & Course: Pt is a 59 year old female who presented to the emergency Department with chief complaint of left vaginal pain.  Patient is on any systemic symptoms associated with this.  On exam patient has a large left labial abscess with significant induration and surrounding cellulitis.  Discussed the procedure the patient she agrees.  She was given a dose of oxycodone prior.  Incision and drainage performed by me with a chaperone, patient tolerated procedure well and significant amount of pus was drained, a ward catheter was placed.  Consulted ObGyn and asked them to see her on Monday, they stated that it is not emergent as she has no signs of systemic infection and they can see her later in the week.  They also state that she does not need antibiotics for this even with the surrounding cellulitis.  Discussed discharge instructions with patient, explained how to do sits baths.  Pain medication was provided.  Signs of infection were discussed and reasons to return.  Pt remained hemodynamically stable during their stay in the emergency department.     Follow Up: with OBGYN this week    Oyuki Hogan PA-C

## 2015-06-09 NOTE — Progress Notes (Signed)
CM met with team this am, then with pt. Pt was medically cleared for d/c today. Pt will be transported home by son and will continue antibiotics. Pt will f/u with appointments.

## 2015-06-09 NOTE — Progress Notes (Signed)
Gyn Progress Note    Subjective:  Interval History: Transitioned to oral antibiotics overnight, reports pain is improving.   Patient denies fevers, chills, nausea, vomiting, abdominal pain. Tolerating regular diet, ambulatory.      Scheduled Meds:   pregabalin  100 mg Oral Daily    metoprolol  12.5 mg Oral Daily    metFORMIN  1,000 mg Oral BID WC    clindamycin  600 mg Oral Q8H [facility]    aspirin EC  81 mg Oral Daily    pravastatin  80 mg Oral Daily after dinner    cholecalciferol  1,000 Units Oral Daily    lisinopril  20 mg Oral Daily     Continuous Infusions:   PRN Meds:oxyCODONE, albuterol, acetaminophen, ondansetron    Objective:    Fingerstick glucose 100    Vital signs in last 24 hours:  Temp:  [97.5 F (36.4 C)-98.4 F (36.9 C)] 97.5 F (36.4 C)  Pulse:  [80-96] 87  Resp:  [20] 20  BP: (91-139)/(65-82) 122/82    Intake/Output last 3 shifts:  I/O last 3 completed shifts:  In: 1060 [P.O.:960; IV Piggyback:100]  Out: 700 [Urine:700]  Intake/Output this shift:       Gen:NAD:   CV:RRR  Pulm: CTAB, normal work of breathing  Abd: soft, obese, nontender  GU: left labial/vulvar abscess approx 2 o'clock with 40mm opening without drainage. No fluctuance, minimal erythema of skin. Grape-sized induration, slightly TTP.     Assessment/Plan:    Principal Problem:    Left genital labial abscess (06/07/2015)    59yo diabetic female with L labial abscess approx 2 o'clock position on majorum, s/p I&D with word catheter placement on 7/16 s/p removal in office 7/19 for concern for worsening infection, now s/p IV antibiotics x24 hours with improved symptoms and resolution of drainage.     *) L Labial abscess  - s/p gent/clinda x24 hours -> po clindamycin x7 days started overnight  - oxycodone and acetaminophen prn for pain  - twice daily sitz baths    *) Dispo: clinically improving, will continue outpatient antibiotics for 7 days. Likely home later today.    Briscoe Burns PGY3  Pager [phone]

## 2015-06-09 NOTE — RN Shift Note (Signed)
Pt given discharge instructions, follow up and medications.  Pt understands discharge instructions, follow up and medications.  Pt discharged home with son.

## 2015-06-11 LAB — WOUND CULTURE AND GRAM STAIN

## 2015-06-16 ENCOUNTER — Encounter (HOSPITAL_BASED_OUTPATIENT_CLINIC_OR_DEPARTMENT_OTHER): Payer: Self-pay | Admitting: Obstetrics & Gynecology

## 2015-06-16 ENCOUNTER — Ambulatory Visit (HOSPITAL_BASED_OUTPATIENT_CLINIC_OR_DEPARTMENT_OTHER): Payer: MEDICARE | Admitting: Obstetrics & Gynecology

## 2015-06-16 VITALS — BP 112/67 | HR 112 | Temp 97.8°F | Wt 228.0 lb

## 2015-06-16 DIAGNOSIS — N764 Abscess of vulva: Secondary | ICD-10-CM

## 2015-06-16 DIAGNOSIS — E66813 Obesity, class 3: Secondary | ICD-10-CM

## 2015-06-16 HISTORY — DX: Obesity, class 3: E66.813

## 2015-06-16 HISTORY — DX: Morbid (severe) obesity due to excess calories: E66.01

## 2015-06-16 NOTE — Progress Notes (Signed)
Presents for hosp follow-up.    58yo [id number]. Hospitalized 06/07/2015-06/10/2015 due to L vulvar abscess, Tx'd entirely with antiBx. Received gentamicin/clindamycin, then discharged on 1wk course of oral clindamycin. Word catheter previously placed in Emergency Dept 06/04/2015, then removed in clinic 06/07/2015. Currently taking course of clindamycin. Complains of persistent vulvar soreness. Urinating normally. Doing sitz baths. Last used oxycodone/acetaminophen yesterday. Still sore, but does not feel as if lesion has increased in size. No Hx similar Sx. Does not remove pubic hair.    06/09/2015 glucose 100mg /dL  06/07/2015 wound Cx E coli, P aeruginosa, Corynebacterium spp, S viridans. WBC 9.6TH/uL.    PMedHx: Obesity. Diabetes (well controlled). Hyperlipidemia. HTN. Asthma.  HPI Comments: Presents alone.    Review of Systems   Constitutional: Positive for weight loss. Negative for chills and fever.     Physical Exam   Constitutional: Vital signs are normal. She appears well-developed. She is cooperative.  Non-toxic appearance. She does not have a sickly appearance. She does not appear ill. No distress.   BMI 40.4.     Genitourinary: Pelvic exam was performed with patient supine. No labial fusion. There is no rash, tenderness, lesion or injury on the right labia. There is no rash or tenderness on the left labia.   Genitourinary Comments: 50mm opening in L sup labium. No erythema, edema, or drainage. Nontender.   Lymphadenopathy:        Right: No inguinal adenopathy present.        Left: No inguinal adenopathy present.   Neurological: She is alert.   Skin: She is not diaphoretic.   Psychiatric: Her behavior is normal. Her speech is not rapid and/or pressured, not delayed, not tangential and not slurred. She is not agitated, not aggressive, not hyperactive, not slowed, not withdrawn, not actively hallucinating and not combative. She is communicative. She is attentive.         L vulvar abscess.  Well-healing. Advised  to complete course of clindamycin.    Obesity.  Encouraged measures to achieve healthy BMI, esp to reduce risk of recurrent vulvar abscess.    I have spent 15 minutes in face to face time with this patient/patient proxy of which > 50% was in counseling or coordination of care regarding above issues/Dx.

## 2015-06-29 ENCOUNTER — Ambulatory Visit (HOSPITAL_BASED_OUTPATIENT_CLINIC_OR_DEPARTMENT_OTHER): Payer: MEDICARE | Admitting: Ambulatory Care

## 2015-06-29 ENCOUNTER — Other Ambulatory Visit (HOSPITAL_BASED_OUTPATIENT_CLINIC_OR_DEPARTMENT_OTHER): Payer: Self-pay | Admitting: Ambulatory Care

## 2015-06-29 DIAGNOSIS — R6889 Other general symptoms and signs: Secondary | ICD-10-CM

## 2015-06-29 MED ORDER — GLUCOSE BLOOD VI STRP
ORAL_STRIP | 11 refills | Status: DC
Start: 2015-06-29 — End: 2016-06-28

## 2015-06-29 NOTE — Progress Notes (Signed)
Pt test her BS once daily  Omaira Mellen J. Marieanne Marxen, RN, 06/29/2015, 12:06 PM

## 2015-06-29 NOTE — Progress Notes (Signed)
Met with pt in the office who walked in requesting refill os Verio test strips  Pt said she has a NP appt on 8/29 with Dr [name].  She mentioned her former PCP sent a script to [place] for test strips but she was told she need to have a CNM form completed before refill can be processed.    Med list reviewed and is up to date  Pt said she test once a day and was told to test daily a while ago after mentioning to former provider that sometimes she would get shaky like her BS was low.  She said she was also on the Metformin 2000 mg daily at the time but no med adjustments were made    Pt said she do not recall her last A1c but her average BS 95 -113   This am BS was 95    Pt has agreed to call us back with A1c from other provider  Call placed to [facility] to send [place] the CNM form.  Spoke with Anda Kraft (pharmacist) she said new PCP will need to send a script for Verio One touch test strips and she will be able to generate a form to accompany the script.    Message sent to Dr [name].  Sloan Takagi J. Caedan Sumler, RN, 06/29/2015, 11:58 AM

## 2015-06-29 NOTE — Progress Notes (Signed)
[facility] Tamang  [facility] Rn [address]               Samantha Cameron [phone], 59 year old, female, Telephone Information:   Home Phone   [phone]   Work Phone   Not on file.   Mobile     Not on file.     CONFIRMED TODAY: Yes     Patient's PCP: Ane Payment, MD, DO     Person calling on behalf of patient: Patient (self)     Calls today for med refill(s). Pt has never been seen here before except obgyn. Pt is requesting for the meds refill of One touch test strips. Per nurse Stanton Kidney pt will be sent upstairs to 3rd floor. Thanks [phone]

## 2015-06-29 NOTE — Progress Notes (Signed)
Test strip order signed and [facility] medicare form completed  Will be faxed and scanned  Makell Drohan P. Keenen Roessner,MD, 06/29/2015, 5:16 PM

## 2015-07-11 ENCOUNTER — Ambulatory Visit (HOSPITAL_BASED_OUTPATIENT_CLINIC_OR_DEPARTMENT_OTHER): Payer: MEDICARE | Admitting: Internal Medicine

## 2015-07-14 ENCOUNTER — Encounter (HOSPITAL_BASED_OUTPATIENT_CLINIC_OR_DEPARTMENT_OTHER): Payer: Self-pay

## 2015-07-14 NOTE — Progress Notes (Signed)
Patient came in to clinic, reports she has new patient appt on Monday, this am woke up with a mark on her left forearm. It appears to be a superficial bruise. No pain. About 1 cm in diameter.  Also reporting a ? abscess behind her left knee. Just noticed this today, denies any recent laceration or bug bites. She soaked area with a warm cloth and squeezed some blood out which relieved pain. Area is about size of a nickel, red. No fever.  Advised if she develops any increased redness, pain, swelling, fever, then she should go to ED, moved new patient appt to tomorrow. Advised she keep area clean, apply warm compresses a few times a day and call back with any questions/concerns, she agreed.

## 2015-07-15 ENCOUNTER — Ambulatory Visit (HOSPITAL_BASED_OUTPATIENT_CLINIC_OR_DEPARTMENT_OTHER): Payer: MEDICARE | Admitting: Internal Medicine

## 2015-07-15 ENCOUNTER — Encounter (HOSPITAL_BASED_OUTPATIENT_CLINIC_OR_DEPARTMENT_OTHER): Payer: Self-pay | Admitting: Internal Medicine

## 2015-07-15 ENCOUNTER — Ambulatory Visit (HOSPITAL_BASED_OUTPATIENT_CLINIC_OR_DEPARTMENT_OTHER): Payer: MEDICARE | Admitting: Family Medicine

## 2015-07-15 VITALS — BP 108/82 | HR 120 | Temp 97.2°F | Ht 60.83 in | Wt 233.0 lb

## 2015-07-15 DIAGNOSIS — M48061 Spinal stenosis, lumbar region without neurogenic claudication: Secondary | ICD-10-CM | POA: Insufficient documentation

## 2015-07-15 DIAGNOSIS — J449 Chronic obstructive pulmonary disease, unspecified: Secondary | ICD-10-CM

## 2015-07-15 DIAGNOSIS — R000 Tachycardia, unspecified: Secondary | ICD-10-CM

## 2015-07-15 DIAGNOSIS — R239 Unspecified skin changes: Secondary | ICD-10-CM

## 2015-07-15 DIAGNOSIS — E119 Type 2 diabetes mellitus without complications: Secondary | ICD-10-CM

## 2015-07-15 DIAGNOSIS — Z139 Encounter for screening, unspecified: Secondary | ICD-10-CM

## 2015-07-15 DIAGNOSIS — F17200 Nicotine dependence, unspecified, uncomplicated: Secondary | ICD-10-CM | POA: Insufficient documentation

## 2015-07-15 DIAGNOSIS — Z720 Tobacco use: Secondary | ICD-10-CM

## 2015-07-15 DIAGNOSIS — E785 Hyperlipidemia, unspecified: Secondary | ICD-10-CM | POA: Insufficient documentation

## 2015-07-15 DIAGNOSIS — E66813 Obesity, class 3: Secondary | ICD-10-CM

## 2015-07-15 DIAGNOSIS — J4489 Other specified chronic obstructive pulmonary disease: Secondary | ICD-10-CM

## 2015-07-15 DIAGNOSIS — Z7189 Other specified counseling: Secondary | ICD-10-CM

## 2015-07-15 LAB — MICROALBUMIN RANDOM URINE
ALB/CREAT RATIO URINE RAN: 481 ug/mg (ref 0–30)
ALBUMIN URINE RANDOM: 52.4 mg/dL — ABNORMAL HIGH (ref 0.0–1.9)
CREATININE RANDOM URINE: 109 mg/dl

## 2015-07-15 MED ORDER — METOPROLOL SUCCINATE ER 25 MG PO TB24
12.5000 mg | ORAL_TABLET | Freq: Every day | ORAL | 12 refills | Status: DC
Start: 2015-07-15 — End: 2016-07-22

## 2015-07-15 NOTE — Progress Notes (Signed)
Samantha Cameron is a 59 year old female here to establish care    Patient arrived late - agenda setting - focus on concern for abscess    Patient Active Problem List    Chronic bilateral low back pain without sciatica            07/13/15 - DDD of lumbar spine, lumbar facet            arthropathy, lumbosacral             radiculopathy, chronic L5 radiculopathy            Had procedure 06/29/15 - with good relief            Taking lyrica 100mg  qhs , percocet 5/325mg  bid prn            Advised to stop smoking            Continue weight reduction, stretching,            strengthening, ice/heat, physical             activity            F/u 4 weeks                  Tobacco use disorder         Date Noted: 07/15/2015      Spinal stenosis of lumbar region         Date Noted: 07/15/2015      HLD (hyperlipidemia)         Date Noted: 07/15/2015      Obesity, Class III, 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y)         Date Noted: 06/16/2015      Left genital labial abscess         Date Noted: 06/07/2015      Diabetes mellitus         Date Noted: 02/01/2012            [facility] record - a1c 6.2 on 01/31/15      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 It appears to be a superficial bruise. No pain. About 1 cm in diameter.  Also reporting a ? abscess behind her left knee. Just noticed this today, denies any recent laceration or bug bites. She soaked area with a warm cloth and squeezed some blood out which relieved pain. Area is about size of a nickel, red. No fever.  Advised if she develops any increased redness, pain, swelling, fever, then she should go to ED, moved new patient appt to tomorrow. Advised she keep area clean, apply warm compresses a few times a day and call back with any questions/concerns, she agreed."    Today reports  left forearm lesion has been present for 1 day    Also noticed bump over posterior left leg  Squeezed and blood came out  Has been present off and on for years  No fever, nausea    DM:  FS - AM reported 92-106    Seeing pain specialist in [place] for low back pain, spinal stenosis  Dr Samantha Cameron    Tobacco - 1/2 ppd    Also reports COPD  No current SOB, wheeze  Accepts labs     AWQ screens negative    Most Recent Weight Reading(s)  07/15/15 : 105.7 kg (233 lb)  06/16/15 : 103.4 kg (228 lb)  06/07/15 : 106.2 kg (234 lb 1.6 oz)  06/07/15 : 107 kg (236 lb)  06/04/15 : 105.7 kg (233 lb)    Most Recent BP Reading(s)  07/15/15 : 108/82  06/16/15 : 112/67  06/09/15 : 122/82  06/07/15 : 100/76  06/04/15 : 109/71      Past Medical History    Arthritis     Back pain     Chest pain 02/01/2012    Chronic bronchitis     Diabetes     Disorders of lipoid metabolism     Esophageal reflux     HTN (hypertension)     Hypoxia 02/01/2012       Past Surgical History    LAPAROSCOPY SURG CHOLECYSTECTOMY      ANES NERVE MUSC TENDON FASCIA&BURSA KNEE&/POPLT      TONSILLECTOMY & ADENOIDECTOMY       TOTAL KNEE REPLACEMENT      Comment left    OB ANTEPARTUM CARE CESAREAN DLVR & POSTPARTUM      Comment x3    FOOT SURGERY      Comment bilateral       Family History    Diabetes Mother     Heart Brother     Comment: CAD and AAA    Hypertension Mother     Arthritis Mother     Stroke Mother     Diabetes Brother     Comment: same brother    Cancer - Other Father     Thyroid Sister     Alcohol/Drug Abuse Son     No Known Problems Grandchild     Comment: 6    Cancer - Breast FamHxNeg     Cancer - Cervical FamHxNeg     Cancer - Ovarian FamHxNeg     Cancer - Colon FamHxNeg     Psychiatric Illness FamHxNeg        Social History   Marital status: Divorced  Spouse name: N/A    Years of education: N/A  Number of children: N/A     Occupational History  None on file     Social History Main Topics   Smoking status: Current Every Day Smoker  0.50 Packs/day  For 31.00  Years     Smokeless tobacco: Not on file    Comment: quit smoking inform provided to patient    Alcohol use No    Drug use: No    Sexual activity: Not Currently    Partners: Male    Birth control/ protection: Tubal Ligation     Other Topics Concern   None on file     Social History Narrative    Lives with son    1 daughter and another son passed away    Disabled from back    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 Samantha Awad,MD, 07/15/2015, 2:56 PM         REVIEW OF SYSTEMS:  Negative in detail, including: No fevers, unexplained weight loss, night sweats or sleep disturbance.  No visual changes. No ear pain or sinus problems. Up to date with dental care. No dyspnea, prolonged cough or wheezing. No chest pain. No breast lumps, pain or discharge. No abdominal pain or change in bowel habits. Nocturia once or less, no dysuria. No vaginal discharge or abnormal bleeding. No edema, no joint swelling or pain. No motor or sensory changes and no unusual headaches. No sadness or anxiety that interferes with daily activities. No hot flashes. No adenopathy.     HM - mammo last 3-4 years ago - need records for this and other HM    PE:  BP 108/82  Pulse 120  Temp 97.2 F (36.2 C) (Temporal)  Ht 5' 0.83" (1.545 m)  Wt 105.7 kg (233 lb)  LMP 10/22/2007  SpO2 96%  BMI 44.28 kg/m2  Estimated body mass index is 44.28 kg/(m^2) as calculated from the following:    Height as of this encounter: 5' 0.83" (1.545 m).    Weight as of this encounter: 105.7 kg (233 lb).  Gen - Morbidly obese female upright in chair in NAD  HEENT - MMM  CV - RRR, no m/r/g  Resp - CTAB  Abd - +BS, soft, NT/ND  Ext - Warm, dry  Neuro - A+Ox3  Psych - Appropriate  Skin - small left forearm ecchymosis.  Left posterior thigh erythematous area with nonpurulent  drainage, nonTTP    A/P:  (R23.9) Skin change  (primary encounter diagnosis)  Comment: Left arm bruising and posterior leg likely draining abscess, no clear cellulitis or joint involvement  Plan: Continue warm compressed  Encouraged to follow-up if fever, increased swelling, pain    (Z72.0) Tobacco use disorder  Comment: Ongoing use  Plan: Encouraged cessation for COPD as below    (M48.06) Spinal stenosis of lumbar region  Comment: Working with external pain specialist  Plan: Continue regimen, request records    (E78.5) Hyperlipidemia, unspecified hyperlipidemia type  Comment: recheck  Plan: Omega-3 Fatty Acids (FISH OIL) 1000 MG CAPS,         COLLECTION VENOUS BLOOD VENIPUNCTURE,         CHOLESTEROL, HIGH DENSITY LIPOPROTEIN, LOW         DENSITY LIPOPROTEIN,DIRECT          (E66.01) Obesity, Class III, 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y)  Comment: By BMI  Plan: cholecalciferol (VITAMIN D3) 1000 UNIT tablet,         COLLECTION VENOUS BLOOD VENIPUNCTURE,         COMPREHENSIVE METABOLIC PANEL, THYROID SCREEN         TSH REFLEX FT4  Discussed portion control, healthy food choice, exercise      (J44.9) 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)  Comment: Controlled on albuterol prn  Plan: Monitor, tobacco cessation encouraged    (E11.9) Type 2 diabetes mellitus without complication, without long-term current use of insulin  Comment: check control, continue metformin, lifestyle change - request records  Plan: COLLECTION VENOUS BLOOD VENIPUNCTURE,         HEMOGLOBIN A1C, ALBUMIN URINE RANDOM,         COMPREHENSIVE METABOLIC PANEL, CHOLESTEROL,         HIGH DENSITY LIPOPROTEIN, LOW DENSITY         LIPOPROTEIN,DIRECT    (Z13.9) Screening  Plan: HEP B SURFACE ANTIGEN, HEPATITIS B SURFACE         ANTIBODY, HEPATITIS C ANTIBODY, HIV ANTIGEN         ANTIBODY    (Z71.89) Advance directive discussed  with patient  Comment: HCP completed    (R00.0) Tachycardia  Comment: Unclear reason for metoprolol - possible  prior HTN - monitor as now borderline hypotension.  Continued tachycardia - may be pain related.  If continued, consider EKG  Plan: metoprolol (TOPROL-XL) 25 MG 24 hr tablet

## 2015-07-18 ENCOUNTER — Ambulatory Visit (HOSPITAL_BASED_OUTPATIENT_CLINIC_OR_DEPARTMENT_OTHER): Payer: MEDICARE | Admitting: Internal Medicine

## 2015-07-18 LAB — HEPATITIS B SURFACE ANTIBODY: HEPATITIS B SURFACE ANTIBODY: NONREACTIVE

## 2015-07-18 LAB — COMPREHENSIVE METABOLIC PANEL
ALANINE AMINOTRANSFERASE: 33 U/L (ref 12–45)
ALBUMIN: 4.4 g/dL (ref 3.4–5.0)
ALKALINE PHOSPHATASE: 104 U/L (ref 45–117)
ANION GAP: 9 mmol/L (ref 5–15)
ASPARTATE AMINOTRANSFERASE: 16 U/L (ref 8–34)
BILIRUBIN TOTAL: 0.3 mg/dL (ref 0.2–1.0)
BUN (UREA NITROGEN): 10 mg/dL (ref 7–18)
CALCIUM: 9.7 mg/dL (ref 8.5–10.1)
CARBON DIOXIDE: 24 mmol/L (ref 21–32)
CHLORIDE: 106 mmol/L (ref 98–107)
CREATININE: 0.6 mg/dL (ref 0.4–1.2)
ESTIMATED GLOMERULAR FILT RATE: >60 mL/min (ref >60)
Glucose Random: 96 mg/dL (ref 74–160)
POTASSIUM: 4.6 mmol/L (ref 3.5–5.1)
SODIUM: 139 mmol/L (ref 136–145)
TOTAL PROTEIN: 7.8 g/dL (ref 6.4–8.2)

## 2015-07-18 LAB — HIV ANTIGEN ANTIBODY
HIV 1/2 PLUS O AG/AB SCREEN: NONREACTIVE
HIV RESULT INTERPRETATION: NONREACTIVE

## 2015-07-18 LAB — CHOLESTEROL: Cholesterol: 162 mg/dL (ref 0–239)

## 2015-07-18 LAB — HEMOGLOBIN A1C
ESTIMATED AVERAGE GLUCOSE: 131 (ref 74–160)
HEMOGLOBIN A1C: 6.2 % — ABNORMAL HIGH (ref 4.0–5.6)

## 2015-07-18 LAB — HIGH DENSITY LIPOPROTEIN: HIGH DENSITY LIPOPROTEIN: 49 mg/dL (ref 40–?)

## 2015-07-18 LAB — THYROID SCREEN TSH REFLEX FT4: THYROID SCREEN TSH REFLEX FT4: 1.17 u[IU]/mL (ref 0.358–3.740)

## 2015-07-18 LAB — HEPATITIS C ANTIBODY: HEPATITIS C ANTIBODY: NONREACTIVE

## 2015-07-18 LAB — LOW DENSITY LIPOPROTEIN DIRECT: LOW DENSITY LIPOPROTEIN DIRECT: 75 mg/dL (ref 0–189)

## 2015-07-18 LAB — HEPATITIS B SURFACE ANTIGEN: HEPATITIS B SURFACE ANTIGEN: NONREACTIVE

## 2015-07-20 ENCOUNTER — Encounter (HOSPITAL_BASED_OUTPATIENT_CLINIC_OR_DEPARTMENT_OTHER): Payer: Self-pay | Admitting: Internal Medicine

## 2015-07-20 DIAGNOSIS — G8929 Other chronic pain: Secondary | ICD-10-CM | POA: Insufficient documentation

## 2015-07-20 DIAGNOSIS — M5442 Lumbago with sciatica, left side: Secondary | ICD-10-CM

## 2015-07-20 DIAGNOSIS — M5441 Lumbago with sciatica, right side: Secondary | ICD-10-CM

## 2015-08-15 ENCOUNTER — Ambulatory Visit (HOSPITAL_BASED_OUTPATIENT_CLINIC_OR_DEPARTMENT_OTHER): Payer: MEDICARE | Admitting: Internal Medicine

## 2015-08-15 ENCOUNTER — Encounter (HOSPITAL_BASED_OUTPATIENT_CLINIC_OR_DEPARTMENT_OTHER): Payer: Self-pay | Admitting: Internal Medicine

## 2015-08-15 VITALS — BP 102/69 | HR 109 | Temp 97.0°F | Ht 60.83 in | Wt 239.0 lb

## 2015-08-15 DIAGNOSIS — M48061 Spinal stenosis, lumbar region without neurogenic claudication: Secondary | ICD-10-CM

## 2015-08-15 DIAGNOSIS — R000 Tachycardia, unspecified: Secondary | ICD-10-CM

## 2015-08-15 DIAGNOSIS — E66813 Obesity, class 3: Secondary | ICD-10-CM

## 2015-08-15 DIAGNOSIS — Z1239 Encounter for other screening for malignant neoplasm of breast: Secondary | ICD-10-CM

## 2015-08-15 DIAGNOSIS — E119 Type 2 diabetes mellitus without complications: Secondary | ICD-10-CM | POA: Insufficient documentation

## 2015-08-15 DIAGNOSIS — R239 Unspecified skin changes: Principal | ICD-10-CM

## 2015-08-15 DIAGNOSIS — Z23 Encounter for immunization: Secondary | ICD-10-CM

## 2015-08-15 DIAGNOSIS — Z0000 Encounter for general adult medical examination without abnormal findings: Secondary | ICD-10-CM

## 2015-08-15 DIAGNOSIS — Z720 Tobacco use: Secondary | ICD-10-CM

## 2015-08-15 DIAGNOSIS — Z124 Encounter for screening for malignant neoplasm of cervix: Secondary | ICD-10-CM

## 2015-08-15 DIAGNOSIS — N9089 Other specified noninflammatory disorders of vulva and perineum: Secondary | ICD-10-CM

## 2015-08-15 DIAGNOSIS — F17200 Nicotine dependence, unspecified, uncomplicated: Secondary | ICD-10-CM

## 2015-08-15 HISTORY — DX: Type 2 diabetes mellitus without complications: E11.9

## 2015-08-15 LAB — SCREENING TEST VISUAL ACUITY QUANTITATIVE BILAT

## 2015-08-15 NOTE — Progress Notes (Signed)
VIS given prior to administration and reviewed with the patient and or legal guardian. Patient understands the disease and the vaccine. See immunization/Injection module or chart review for date of publication and additional information.

## 2015-08-15 NOTE — Progress Notes (Addendum)
Female Annual Wellness Visit  Progress Note     Subjective:   Samantha Cameron is a 59 year old female who comes for the Subsequent Annual wellness visit, including Personalized Prevention Plan (PPPE) (it has been at least 12 months since the annual wellness visit)..     History: (These sections have been updated in their respective activities)   Review of Patient's Allergies indicates:   Motrin [ibuprofen]      Nausea Only   Bactrim                 Itching   Codeine camsylate           Past Medical History    Arthritis     Back pain     Chest pain 02/01/2012    Chronic bronchitis     Diabetes     Disorders of lipoid metabolism     Esophageal reflux     HTN (hypertension)     Hypoxia 02/01/2012       Past Surgical History    LAPAROSCOPY SURG CHOLECYSTECTOMY      ANES NERVE MUSC TENDON FASCIA&BURSA KNEE&/POPLT      TONSILLECTOMY & ADENOIDECTOMY       TOTAL KNEE REPLACEMENT      Comment left    OB ANTEPARTUM CARE CESAREAN DLVR & POSTPARTUM      Comment x3    FOOT SURGERY      Comment bilateral       Family History    Diabetes Mother     Heart Brother     Comment: CAD and AAA    Hypertension Mother     Arthritis Mother     Stroke Mother     Diabetes Brother     Comment: same brother    Cancer - Other Father     Thyroid Sister     Alcohol/Drug Abuse Son     No Known Problems Grandchild     Comment: 6    Cancer - Breast FamHxNeg     Cancer - Cervical FamHxNeg     Cancer - Ovarian FamHxNeg     Cancer - Colon FamHxNeg     Psychiatric Illness FamHxNeg        Social History   Marital status: Divorced  Spouse name: N/A    Years of education: N/A  Number of children: N/A     Occupational History  None on file     Social History Main Topics   Smoking status: Current Every Day Smoker  0.50 Packs/day  For 31.00 Years     Smokeless tobacco: Not on file    Comment: quit smoking inform provided to patient    Alcohol use No    Drug use: No    Sexual activity: Not Currently    Partners: Male    Birth control/ protection: Tubal  Ligation     Other Topics Concern   None on file     Social History Narrative    Lives with son    1 daughter and another son passed away    Disabled from back    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 Sione Baumgarten,MD,  07/15/2015, 2:56 PM           Current Outpatient Prescriptions:  metoprolol (TOPROL-XL) 25 MG 24 hr tablet Take 0.5 tablets by mouth daily At bedtime- half pill Disp: 15 tablet Rfl: 12   Omega-3 Fatty Acids (FISH OIL) 1000 MG CAPS Take by mouth Disp:  Rfl:    cholecalciferol (VITAMIN D3) 1000 UNIT tablet Take 1 tablet by mouth daily Disp:  Rfl:    glucose blood (ONETOUCH VERIO) test strip Use as instructed Disp: 100 strip Rfl: 11   atorvastatin (LIPITOR) 20 MG tablet Take 1 tablet by mouth daily Disp:  Rfl:    pregabalin (LYRICA) 100 MG capsule Take 100 mg by mouth 2 (two) times daily With Dr Dwyane Dee Disp:  Rfl:    fluticasone (FLONASE) 50 MCG/ACT nasal spray 1 spray by Each Nostril route daily. Disp:  Rfl:    oxycodone-acetaminophen (PERCOCET) 5-325 MG per tablet Take 1 tablet by mouth every 4 (four) hours as needed With Dr Dwyane Dee Disp:  Rfl:    aspirin 81 MG tablet Take 81 mg by mouth daily. Disp:  Rfl:    LISINOPRIL OR Take 20 mg by mouth daily. Disp:  Rfl:    METFORMIN HCL OR 2000 mg po daily with dinner Disp:  Rfl:    albuterol (PROVENTIL HFA,VENTOLIN HFA) 108 (90 BASE) MCG/ACT inhaler Inhale 2 puffs into the lungs every 6 (six) hours as needed. Disp:  Rfl:      No current facility-administered medications for this visit.     Providers and Suppliers (See demographics, encounters tab, filter by provider)  Suann Larry. Howie Rufus,MD, MD    [facility] - [address], [place]  Phone: [phone] Fax: [phone]    Specialists:  Ortho: Dr Dwyane Dee    Screening:     Cognitive screen  -  Mini cognitive test--Patient was given the following instructions  1. Repeat the following: APPLE, WATCH, PENNY  2. Draw the hours of a clock inside a circle, placing the hands of the clock to represent the time forty five minutes past ten oclock  3. Repeat the three words given previously    Scoring--Number of words recalled properly:  1-2: With normal clock drawing--Negative for cognitive impairment    Cognitive impairment is not detected byobservation, patient report and minicog   Depression screen, See Questions 3-4 of Health Risk Assessment.  Last PHQ9 total score (Recorded from the Screening Questions section):  No flowsheet data found.   Vision screen -    Vision with corrective lenses: V OD 20/25 V OS 20/25    Hearing loss screen -  Trouble hearing the television or radio when others can hear it? No  Straining or struggling to hear/understand conversations? No   Falls risk screen, See Questions 19-20 of Health Risk Assessment. Function screen, See Questions 8-13 of Health Risk Assessment.   Screen for overweight and hypertension:   BP 102/69  Pulse 109  Temp 97 F (36.1 C) (Temporal)  Ht 5' 0.83" (1.545 m)  Wt 108.4 kg (239 lb)  LMP 10/22/2007  SpO2 96%  BMI 45.42 kg/m2  Body mass index is 45.42 kg/(m^2).   One-time screening for abdominal aortic aneurysm (AAA) by ultrasonography in men aged 24 to 34 who have ever smoked:  Is not indicated in this female who reports that she has been smoking.  She has a 15.50 pack-year smoking history. She does not have any smokeless tobacco history on file.   Hepatitis B risk  Assessment:    Because of lack of identifiable risk factors, her risk for hepatitis B is low (1) (moderate or high: consider Hep B vaccination)     Home safety screen:    Does home have throw rugs, poor lighting or slippery bathtub or shower? No  Does home LACK grab bars in bathrooms or handrails on stairs and steps? No  Does home LACK functioning smoke alarms? No   Health Risk Assessment:  Recorded in the Screening Questions Section, can be reviewed in Flowsheets activity tab under [place]'.     All above screening responses were reviewed, and all positive or abnormal responses were further evaluation or sent for referral as appropriate.    Counseling:     Advanced Care Planning:  The patient has already competed an advance directive     Health Maintenance: See Health Maintenance Report for details of screening done.    Health Maintenance Items Overdue and Due Soon  DM COMPLETE FOOT EXAM due on 10/26/1972  HEP B HIGH RISK VACCINE EVAL (ONCE) due on 10/26/1972  PNEUMOCOCCAL VACCINE PPSV-23 due on 10/26/1972  OPHTHALMOLOGY VISIT due on 10/26/1972  DM DENTAL REFERRAL due on 10/26/1972  DM NUTRITION REFERRAL due on 10/26/1972  PAP SMEAR due on 10/26/1984  HPV SCREENING due on 10/26/1984  TDAP/TD VACCINE(1 - Tdap) due on 11/02/2001  PHYSICAL EXAM (AGE 76+) due on 10/26/2004  MAMMOGRAPHY due on 10/26/2004  ZOSTER VACCINE,AGE 37 AND OLDER (ONCE) due on 10/26/2014  INFLUENZA VACCINE(1) due on 07/21/2015    All Health Maintenance Items and DueDates -  DM COMPLETE FOOT EXAM due on 10/26/1972  HEP B HIGH RISK VACCINE EVAL (ONCE) due on 10/26/1972  PNEUMOCOCCAL VACCINE PPSV-23 due on 10/26/1972  OPHTHALMOLOGY VISIT due on 10/26/1972  DM DENTAL REFERRAL due on 10/26/1972  DM NUTRITION REFERRAL due on 10/26/1972  PAP SMEAR due on 10/26/1984  HPV SCREENING due on 10/26/1984  TDAP/TD VACCINE(1 - Tdap) due on 11/02/2001  PHYSICAL EXAM (AGE 76+) due on 10/26/2004  MAMMOGRAPHY due on 10/26/2004  ZOSTER VACCINE,AGE 37 AND OLDER (ONCE) due on 10/26/2014  INFLUENZA VACCINE(1) due on 07/21/2015  DM HEMOGLOBIN A1C due on 01/15/2016  Smoking/Tobacco Cessation Counseling due on 06/03/2016  LIPID SCREENING due on 07/14/2016  AWQ Questionnaire due on 07/14/2016  COLONOSCOPY due on 09/01/2018  HEALTH CARE PROXY due on 07/14/2020  HIV SCREENING Completed  HEP C SCREEN (DOB [birth year]) Completed  DM EKG > 40 (ONE  TIME ONLY) Completed  DM MICROALBUMIN Completed    Medicare GYN Exam:     GYN History:  post menopausal  Estrogen replacementtherapy: never  DES exposure: no  History of abnormal Papsmear: no  Family history of uterine or ovarian cancer: no  Regular self breast exam: no  History of abnormal mammogram: no  Family history of breast cancer: no    Pelvic: defers exam to gyn    Assessment & Plan:     Acute Issues:  Posterior leg bump/abscess resolved per patient    Working with external Dr Dwyane Dee on chronic back pain    Reports bump on right labia   No fever, drainage  Due for pap as well    Hot flashes over past 10 years     Colonoscopy 2014   Due for mammo    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   Taking metoprolol qhs    Tobacco 1/2 ppd or less    ADLs - needs assistance with laundry, heavy lifting - son helps  No home services  Asking about shower chair    Had flu shot few weeks ago at [facility] - need records  Due for ppsv23  Patient to ask pharmacy re: zoster  Had tdap in 2009  Due for hep B vacc    HEMOGLOBIN A1C (%)   Date Value   07/15/2015 6.2 (H)       Identified Problems and Risks:  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) ([place])     Left genital labial abscess     Obesity, Class III, 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y) ([place])     Tobacco use disorder     Spinal stenosis of lumbar region     HLD (hyperlipidemia)     Chronic bilateral low back pain without sciatica     Skin change     Type 2 diabetes mellitus without complication, without long-term current use of insulin (HCC)     Tachycardia     Pineal gland cyst     History of vitamin D deficiency     Hemorrhoids     Venous (peripheral) insufficiency     Osteopenia     Adrenal adenoma       Orders, Prescriptions, and Referrals placed in this Encounter:    Orders Placed This Encounter      IMMUNIZATION ADMIN SINGLE [90471.1]      HEP B ADULT (3 DOSE), IM - AGE 23+ (PRIVATE) [90746]      PPSV-23 (Pneumovax), IM  (PRIVATE) [90732]      IMMUNIZATION ADMIN EACH ADD [16109.6]      Visual Acuity Screen (Point of Care)    Personalized Instructions:   See Patient Instruction section    PE:  BP 102/69  Pulse 109  Temp 97 F (36.1 C) (Temporal)  Ht 5' 0.83" (1.545 m)  Wt 108.4 kg (239 lb)  LMP 10/22/2007  SpO2 96%  BMI 45.42 kg/m2  Estimated body mass index is 45.42 kg/(m^2) as calculated from the following:    Height as of this encounter: 5' 0.83" (1.545 m).    Weight as of this encounter: 108.4 kg (239 lb).  Gen - Morbidly obese female upright in chair in NAD  HEENT - NC/AT, PERRLA, EOMI, no papilledema, TM normal in appearance, EAC clear bilaterally, nares patent, posterior pharynx free of erythema and exudate, MMM  Neck - FROM, no LAD or thyromegaly  CV - RRR, no m/r/g  Resp - CTAB  Abd - +BS, soft, NT/ND, no organomegaly or masses  GU - Skin colored central labial lesion 1cm, minimally TTP, no warmth or fluctuance.  Speculum exam attempted but patient unable to tolerate due to discomfort - stopped before pap could be obtained   Ext - Warm, dry  Neuro - A+Ox3, CN 2-12 intact bilaterally, sensation to lt touch intact throughout all dermatomes of UE and LE, strength 5/5 in all major muscle groups of UE and LE, normal stance and gait  Psych - Appropriate  Skin - Without concerning lesions    A/P:  (Z00.00) Encounter for Medicare annual wellness exam  (primary encounter diagnosis)  Comment: I have reviewed the past medical, surgical, social and family history and updated these sections of EpicCare as relevant. All interim labs, test results, and consult notes were reviewed and discussed with Jilda Roche. Medications were reconciled during this visit and a current medication list was given to the patient at the end of the visit.  Plan: SCREENING  TEST VISUAL ACUITY QUANTITATIVE BILAT    (Z23) Need for prophylactic vaccination and inoculation against viral hepatitis  Plan: IMMUNIZATION ADMIN SINGLE, RN, PR HEPB VACCINE          ADULT 3 DOSE SCHEDULE FOR IM USE    (Z23) Need for prophylactic vaccination against Streptococcus pneumoniae (pneumococcus)  Plan: PR PPSV23 VACCINE 2 YRS OR OLDER FOR SUBQ/IM         USE, IMMUNIZATION ADMIN EACH ADD, RN    (Z12.39) Screening for malignant neoplasm of breast  Comment: due for mammo  Plan: patient to book    (Z12.4) Screening for cervical cancer  (N90.89) Labial lesion  Comment: Unable to complete exam today for pap, will refer to gyn for labial lesion and pap can be completed at that visit  Plan: [place] (INT)  Plan: Follow-up w/gyn at patient request    (E66.01) Obesity, Class III, 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y) ([place])  Comment: By BMI  Plan: Discussed portion control, healthy food choice, exercise    (Z72.0) Tobacco use disorder  Comment: Ongoing, cutting down  Plan: Encouraged cessation    (M48.06) Spinal stenosis of lumbar region  Comment: Chronic pain  Plan: Continue current regimen, follow-up with Dr Dwyane Dee    (E11.9) Type 2 diabetes mellitus without complication, without long-term current use of insulin ([place])  Comment: Controlled  Plan: Continue metformin, lifestyle change    (R00.0) Tachycardia  Comment: Intermittent   Plan: Continue metoprolol  If not improving, consider cardiology visit

## 2015-08-16 ENCOUNTER — Encounter (HOSPITAL_BASED_OUTPATIENT_CLINIC_OR_DEPARTMENT_OTHER): Payer: Self-pay | Admitting: Internal Medicine

## 2015-08-16 DIAGNOSIS — M8580 Other specified disorders of bone density and structure, unspecified site: Secondary | ICD-10-CM

## 2015-08-16 DIAGNOSIS — D3500 Benign neoplasm of unspecified adrenal gland: Secondary | ICD-10-CM | POA: Insufficient documentation

## 2015-08-16 DIAGNOSIS — I872 Venous insufficiency (chronic) (peripheral): Secondary | ICD-10-CM | POA: Insufficient documentation

## 2015-08-16 DIAGNOSIS — Z8639 Personal history of other endocrine, nutritional and metabolic disease: Secondary | ICD-10-CM | POA: Insufficient documentation

## 2015-08-16 DIAGNOSIS — E348 Other specified endocrine disorders: Secondary | ICD-10-CM | POA: Insufficient documentation

## 2015-08-16 DIAGNOSIS — K648 Other hemorrhoids: Secondary | ICD-10-CM | POA: Insufficient documentation

## 2015-08-16 HISTORY — DX: Other specified disorders of bone density and structure, unspecified site: M85.80

## 2015-09-12 ENCOUNTER — Other Ambulatory Visit (HOSPITAL_BASED_OUTPATIENT_CLINIC_OR_DEPARTMENT_OTHER): Payer: Self-pay | Admitting: Internal Medicine

## 2015-09-12 DIAGNOSIS — Z1239 Encounter for other screening for malignant neoplasm of breast: Secondary | ICD-10-CM

## 2015-09-12 NOTE — Progress Notes (Signed)
mammo ordered  [facility]. Alwilda Gilland,MD, 09/12/2015, 2:40 PM

## 2015-09-13 ENCOUNTER — Encounter (HOSPITAL_BASED_OUTPATIENT_CLINIC_OR_DEPARTMENT_OTHER): Payer: Self-pay | Admitting: Internal Medicine

## 2015-09-13 DIAGNOSIS — E1129 Type 2 diabetes mellitus with other diabetic kidney complication: Secondary | ICD-10-CM | POA: Insufficient documentation

## 2015-09-13 DIAGNOSIS — R809 Proteinuria, unspecified: Secondary | ICD-10-CM

## 2015-09-14 ENCOUNTER — Ambulatory Visit (HOSPITAL_BASED_OUTPATIENT_CLINIC_OR_DEPARTMENT_OTHER): Payer: MEDICARE | Admitting: Internal Medicine

## 2015-09-14 VITALS — BP 108/77 | HR 109 | Wt 237.0 lb

## 2015-09-14 DIAGNOSIS — R519 Headache, unspecified: Secondary | ICD-10-CM

## 2015-09-14 DIAGNOSIS — R059 Cough, unspecified: Secondary | ICD-10-CM

## 2015-09-14 DIAGNOSIS — E1129 Type 2 diabetes mellitus with other diabetic kidney complication: Secondary | ICD-10-CM

## 2015-09-14 DIAGNOSIS — H6121 Impacted cerumen, right ear: Secondary | ICD-10-CM | POA: Insufficient documentation

## 2015-09-14 DIAGNOSIS — Z23 Encounter for immunization: Secondary | ICD-10-CM

## 2015-09-14 DIAGNOSIS — R809 Proteinuria, unspecified: Secondary | ICD-10-CM

## 2015-09-14 DIAGNOSIS — F17200 Nicotine dependence, unspecified, uncomplicated: Secondary | ICD-10-CM

## 2015-09-14 DIAGNOSIS — R51 Headache: Secondary | ICD-10-CM

## 2015-09-14 DIAGNOSIS — R05 Cough: Secondary | ICD-10-CM

## 2015-09-14 HISTORY — DX: Cough, unspecified: R05.9

## 2015-09-14 HISTORY — DX: Headache, unspecified: R51.9

## 2015-09-14 HISTORY — DX: Impacted cerumen, right ear: H61.21

## 2015-09-14 LAB — BLOOD SUGAR FINGERSTICK (POINT OF CARE): FINGERSTICK GLUCOSE: 121 mg/dl (ref 74–160)

## 2015-09-14 MED ORDER — PSEUDOEPHEDRINE HCL 30 MG PO TABS
30.00 mg | ORAL_TABLET | Freq: Four times a day (QID) | ORAL | 0 refills | Status: AC | PRN
Start: 2015-09-14 — End: 2015-09-28

## 2015-09-14 MED ORDER — CARBAMIDE PEROXIDE 6.5 % OT SOLN
5.00 [drp] | Freq: Two times a day (BID) | OTIC | 0 refills | Status: AC
Start: 2015-09-14 — End: 2015-09-19

## 2015-09-14 NOTE — Progress Notes (Signed)
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) ([place])     Left genital labial abscess     Obesity, Class III, 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y) ([place])     Tobacco use disorder     Spinal stenosis of lumbar region     HLD (hyperlipidemia)     Chronic bilateral low back pain without sciatica     Skin change     Type 2 diabetes mellitus without complication, without long-term current use of insulin (HCC)     Tachycardia     Pineal gland cyst     History of vitamin D deficiency     Hemorrhoids     Venous (peripheral) insufficiency     Osteopenia     Adrenal adenoma     Type 2 diabetes mellitus with microalbuminuria, without long-term current use of insulin ([place])    States yesterday while testing her blood sugar was 77, her monitor said control solution  Blood sugar this morning was 109   In clinic random today 121  Meter reviewed - appears normal  Average for last 7 days was 144   Reports doing okay with exercise and diet   Said she has been walking, using wheat bread, eating salads   Eats at 11 am, 1 or 2 pm, and 6 or 7 pm.   Does not eat breakfast due to nausea   Denies needing an refills of her medications   Last saw the eye doctor in July at [facility] in [place]  Reports only a tiny change with vision in relation to diabetes  Last dental appt was 2 years ago  Inquires about drinking Glucerna shakes  Smokes a half a ppd now  Takes metformin 2000mg  once daily  HEMOGLOBIN A1C (%)   Date Value   07/15/2015 6.2 (H)     Has scheduled a pap smear and vaginal follow up on November 7th with OBGYN    Mammogram booked 11/1    Sick one and half weeks ago  Wants to have her ear checked  Reports that she has been having HAs across the top of her head, thinks its her sinuses  Cough is still there   Feels like she is breathing fine  Denies any fevers, rhinorrhea abdominal pain, dysuria or  bowel movement issues    Most Recent BP Reading(s)  09/14/15 : 108/77  08/15/15 : 102/69  07/15/15 : 108/82  06/16/15 : 112/67  06/09/15 : 122/82    Most Recent Weight Reading(s)  09/14/15 : 107.5 kg (237 lb)  08/15/15 : 108.4 kg (239 lb)  07/15/15 : 105.7 kg (233 lb)  06/16/15 : 103.4 kg (228 lb)  06/07/15 : 106.2 kg (234 lb 1.6 oz)    PE:  BP 108/77  Pulse 109  Wt 107.5 kg (237 lb)  LMP 10/22/2007  SpO2 97%  BMI 45.04 kg/m2  Estimated body mass index is 45.04 kg/(m^2) as calculated from the following:    Height as of 08/15/15: 5' 0.83" (1.545 m).    Weight as of this encounter: 107.5 kg (237 lb).  Gen - Morbidly obese female upright in chair in NAD  HEENT - MMM, Right ear cerumen impaction   Left ear with clear EAC, normal TM, no facial TTP  CV - RRR, no m/r/g  Resp - CTAB  Abd - +BS, soft,  NT/ND  Ext - Warm, dry  Neuro - A+Ox3  Psych - Appropriate    A/P:  (E11.29,  R80.9) Type 2 diabetes mellitus with microalbuminuria, without long-term current use of insulin (HCC)  (primary encounter diagnosis)  (R80.9) Microalbuminuria  Comment: Controlled at last check, requesting records from eye clinic, patient to make dental appt, continue metformin, lifestyle change. Declines nutrition, accepts hep B, had flu shot  Plan: BLOOD SUGAR FINGERSTICK (POINT OF CARE)        Continue on metformin as prescribed  A1C testing next month with visit    (E66.01) 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 (HCC)  Comment: BY BMI  Plan: Discussed portion control, healthy food choice, exercise    (R51) Acute nonintractable headache, unspecified headache type  Comment: Likely sinus symptoms with recent cold, no clear sinus infection  Plan: Take Sudafed up to 4 times a day as needed    (Z23) Need for prophylactic vaccination and inoculation against viral hepatitis  Plan: IMMUNIZATION ADMIN SINGLE, RN, PR HEPB VACCINE         ADULT 3 DOSE SCHEDULE FOR IM USE        Administered today    (F17.200) Tobacco use disorder  Comment:  Contemplative, cutting down  Plan: Smoking Cessation Counseling provided    (R05) Cough  Comment: Likely postviral  Plan: Cut back on smoking  Discussed supportive care with rest, hydration, tea with honey, humidified air.  Advised to call or return to clinic if has fever, SOB, CP, worsening symptoms or persistent symptoms after 1-2 weeks.       (H61.21) Right ear impacted cerumen  Comment: discussed  Plan: Use 5 drops of Debrox in right ear bid for 5 days  Advised against qtip use    By signing my name below, I, Elbert Ewings, attest that this documentation has been prepared under the direction and in the presence of Osa Craver, MD.   Electronically Signed: Elbert Ewings, Scribe, 09/14/15, 14:47 pm..

## 2015-09-14 NOTE — Progress Notes (Signed)
VIS given prior to administration and reviewed with the patient and or legal guardian. Patient understands the disease and the vaccine. See immunization/Injection module or chart review for date of publication and additional information.    Advised to return for #3 hep B around march 26

## 2015-09-14 NOTE — Progress Notes (Deleted)
09/14/2015  VIS given prior to administration and reviewed with the patient and or legal guardian. Patient understands the disease and the vaccine. See immunization/Injection module or chart review for date of publication and additional information.  Briellah Baik P. Jeno Calleros,MD

## 2015-09-19 ENCOUNTER — Telehealth (HOSPITAL_BASED_OUTPATIENT_CLINIC_OR_DEPARTMENT_OTHER): Payer: Self-pay

## 2015-09-19 NOTE — Progress Notes (Signed)
[facility]    [facility] PT-1 Request    PT-1 Form Summary   Your PT1 request(s) have been submitted. Please print this screen using your browser print button for confirmation of your submission or keep the tracking number available for your records.   Treater Name:  Ninfa Linden  Tracking #: [id number]    Member Name:  [name] I  Status: Pending    RequestType:  New Form  Created: 09/19/2015 2:22:00 PM       Facility Location:  Member Pickup Address:    Ninfa Linden, [address] [place], [place], [address]  [place], [place], [address]    [phone]  [place] within [place]:  - No   Requested Service:  - Pain Management   Duration of Services:  - 12 MONTHS   Frequency of Services:  - 4 visit(s) per MONTH   Wheelchair Lucianne Lei needed:  - No   Escort accompanying the member:  - Retail buyer needed: - No                    2015 [employer] [place].

## 2015-09-20 ENCOUNTER — Ambulatory Visit: Payer: Self-pay | Admitting: Internal Medicine

## 2015-09-26 ENCOUNTER — Ambulatory Visit (HOSPITAL_BASED_OUTPATIENT_CLINIC_OR_DEPARTMENT_OTHER): Payer: MEDICARE | Admitting: Obstetrics & Gynecology

## 2015-09-26 ENCOUNTER — Encounter (HOSPITAL_BASED_OUTPATIENT_CLINIC_OR_DEPARTMENT_OTHER): Payer: Self-pay | Admitting: Obstetrics & Gynecology

## 2015-09-26 VITALS — BP 112/79 | HR 100 | Wt 236.0 lb

## 2015-09-26 DIAGNOSIS — Z124 Encounter for screening for malignant neoplasm of cervix: Secondary | ICD-10-CM

## 2015-09-26 DIAGNOSIS — N907 Vulvar cyst: Secondary | ICD-10-CM

## 2015-09-26 NOTE — Progress Notes (Signed)
Pt has had complete physical exam   Due for pap screening    Recently under care of GYN service for vulvar abscess    Referred by PCP due to lesion on right labia majora    NEFG  Mild atrophic changes  Left labia majora with ! Cm non tender lesion CW sebaceous cyst    Vagina clear  vervix no lesion  Pap done    Sebaceous cyst  Pap    F/u prn  annual

## 2015-09-27 LAB — HUMAN PAPILLOMAVIRUS (HPV): HUMAN PAPILLOMAVIRUS: NEGATIVE

## 2015-09-30 LAB — CYTOPATH, C/V, THIN LAYER

## 2015-10-03 ENCOUNTER — Encounter (HOSPITAL_BASED_OUTPATIENT_CLINIC_OR_DEPARTMENT_OTHER): Payer: Self-pay | Admitting: Obstetrics & Gynecology

## 2015-10-17 ENCOUNTER — Ambulatory Visit (HOSPITAL_BASED_OUTPATIENT_CLINIC_OR_DEPARTMENT_OTHER): Payer: MEDICARE | Admitting: Internal Medicine

## 2015-10-17 ENCOUNTER — Encounter (HOSPITAL_BASED_OUTPATIENT_CLINIC_OR_DEPARTMENT_OTHER): Payer: Self-pay | Admitting: Internal Medicine

## 2015-10-17 VITALS — BP 120/74 | HR 104 | Temp 97.3°F | Wt 235.0 lb

## 2015-10-17 DIAGNOSIS — R519 Headache, unspecified: Secondary | ICD-10-CM

## 2015-10-17 DIAGNOSIS — R809 Proteinuria, unspecified: Secondary | ICD-10-CM

## 2015-10-17 DIAGNOSIS — E1129 Type 2 diabetes mellitus with other diabetic kidney complication: Secondary | ICD-10-CM

## 2015-10-17 DIAGNOSIS — E66813 Obesity, class 3: Secondary | ICD-10-CM

## 2015-10-17 DIAGNOSIS — J4489 Other specified chronic obstructive pulmonary disease: Secondary | ICD-10-CM

## 2015-10-17 DIAGNOSIS — J449 Chronic obstructive pulmonary disease, unspecified: Secondary | ICD-10-CM

## 2015-10-17 DIAGNOSIS — R000 Tachycardia, unspecified: Secondary | ICD-10-CM

## 2015-10-17 DIAGNOSIS — F17200 Nicotine dependence, unspecified, uncomplicated: Secondary | ICD-10-CM

## 2015-10-17 DIAGNOSIS — R51 Headache: Secondary | ICD-10-CM

## 2015-10-17 MED ORDER — ALBUTEROL SULFATE HFA 108 (90 BASE) MCG/ACT IN AERS
2.0000 | INHALATION_SPRAY | Freq: Four times a day (QID) | RESPIRATORY_TRACT | 11 refills | Status: DC | PRN
Start: 2015-10-17 — End: 2017-02-13

## 2015-10-17 NOTE — Progress Notes (Signed)
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) ([place])     Left genital labial abscess     Obesity, Class III, 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y) (Home)     Tobacco use disorder     Spinal stenosis of lumbar region     HLD (hyperlipidemia)     Chronic bilateral low back pain without sciatica     Skin change     Type 2 diabetes mellitus without complication, without long-term current use of insulin (HCC)     Tachycardia     Pineal gland cyst     History of vitamin D deficiency     Hemorrhoids     Venous (peripheral) insufficiency     Osteopenia     Adrenal adenoma     Type 2 diabetes mellitus with microalbuminuria, without long-term current use of insulin ([place])     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 (HCC)     Acute nonintractable headache     Cough     Right ear impacted cerumen    COPD:  States that she is sick currently   Currently taking sudafed and Flonase   Notes feeling dizzy yesterday   States that she is having ear issues, left ear pain worse than right  Notes she has been using her albuterol   Has used her albuterol 4 times in the past two days and only once today   Smoking about 6 cigarettes a day   Says her smoking is getting better  No recent PFTs    Taking percocet every 12 hours for pain  Unsure if she can take tylenol but is open to taking it     Currently taking metformin   States that she has been walking   Notes she has a treadmill at home   Her breathing has been okay during exercise   States she is working on her diet   States she will call to schedule an appointment for the dentist today or tomorrow    Most Recent BP Reading(s)  10/17/15 : 120/74  09/26/15 : 112/79  09/14/15 : 108/77  08/15/15 : 102/69  07/15/15 : 108/82    Most Recent Weight Reading(s)  10/17/15 : 106.6 kg (235 lb)  09/26/15 : 107 kg (236 lb)  09/14/15 : 107.5 kg (237 lb)  08/15/15 :  108.4 kg (239 lb)  07/15/15 : 105.7 kg (233 lb)    Has had her pap and has her mammogram rescheduled - 12/19  Is still considering having the shingles shot     PE:  BP 120/74  Pulse 104  Temp 97.3 F (36.3 C) (Temporal)  Wt 106.6 kg (235 lb)  LMP 10/22/2007  SpO2 97%  BMI 44.66 kg/m2  Estimated body mass index is 44.66 kg/(m^2) as calculated from the following:    Height as of 08/15/15: 5' 0.83" (1.545 m).    Weight as of this encounter: 106.6 kg (235 lb).  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   Discussed supportive care with rest, hydration, tea with honey, humidified air.  Advised to call or return to clinic if has fever, SOB, CP, worsening symptoms or persistent symptoms after 1-2 weeks.    Tobacco cessation as below    (E66.01) Obesity, Class III, 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y) ([place])  Comment: By BMI  Plan: Discussed portion control, healthy food choice, exercise    (F17.200) Tobacco use disorder  Comment: Contemplative  Plan: Smoking cessation counseling provided    (R00.0) Tachycardia  Comment: May be related to albuterol or dehydration  Plan: Increase hydration throughout day, recheck    (E11.29,  R80.9) Type 2 diabetes mellitus with microalbuminuria, without long-term current use of insulin ([place])  Comment: Controlled  Plan: Continue taking metformin as prescribed, lifestyle change    (R51) Acute nonintractable headache, unspecified headache type  Comment: Worse  with recent cold - continue sudafed, flonase, hydration, other supportive care as above  Plan:Continue on current medications as prescried    By signing my name below, I, Elbert Ewings, attest that this documentation has been prepared under the direction and in the presence of Osa Craver, MD.   Electronically Signed: Elbert Ewings,  Scribe, 10/17/2015, 2:06 PM    I, Osa Craver, personally performed the services described in this documentation. All medical record entries made by the scribe were at my direction and in my presence. I have reviewed the chart and discharge instructions (if applicable) and agree that the record reflects my personal performance and is accurate and complete.   Courtni Balash P. Dalonda Simoni,MD, 10/17/2015, 9:55 PM

## 2015-10-21 ENCOUNTER — Other Ambulatory Visit (HOSPITAL_BASED_OUTPATIENT_CLINIC_OR_DEPARTMENT_OTHER): Payer: Self-pay | Admitting: Internal Medicine

## 2015-10-21 MED ORDER — ATORVASTATIN CALCIUM 20 MG PO TABS
20.0000 mg | ORAL_TABLET | Freq: Every day | ORAL | 3 refills | Status: DC
Start: 2015-10-21 — End: 2016-09-30

## 2015-10-21 NOTE — Progress Notes (Signed)
PER Patient (self), Samantha Cameron is a 59 year old female has requested a refill of atorvastatin 20 .     - Please review, atorvastatin  has been marked historical on 06/07/15.       Last Office Visit: 10-17-15  Last Physical Exam: 08-15-15      Statin Med:  Lipids   CHOLESTEROL (mg/dL)   Date Value   07/15/2015 162   ----------    LOW DENSITY LIPOPROTEIN DIRECT (mg/dL)   Date Value   07/15/2015 75   ----------    HIGH DENSITY LIPOPROTEIN (mg/dL)   Date Value   07/15/2015 49   ----------    TRIGLYCERIDES (mg/dl)   Date Value   02/08/2009 189 (H)   ----------  LFTs     ALANINE AMINOTRANSFERASE (U/L)   Date Value   07/15/2015 33   ----------      ASPARTATE AMINOTRANSFERASE (U/L)   Date Value   07/15/2015 16   ----------      ALBUMIN (g/dL)   Date Value   07/15/2015 4.4   ----------      TOTAL PROTEIN (g/dL)   Date Value   07/15/2015 7.8   ----------      BILIRUBIN DIRECT (mg/dl)   Date Value   08/21/2003 < 0.1   ----------      BILIRUBIN TOTAL (mg/dL)   Date Value   07/15/2015 0.3   ----------      ALKALINE PHOSPHATASE (U/L)   Date Value   07/15/2015 104   ----------      Documented patient preferred pharmacies:    [facility] [place], [place]  Phone: [phone] Fax: [phone]

## 2015-10-27 ENCOUNTER — Ambulatory Visit (HOSPITAL_BASED_OUTPATIENT_CLINIC_OR_DEPARTMENT_OTHER): Payer: Self-pay | Admitting: Internal Medicine

## 2015-10-27 MED ORDER — ALBUTEROL SULFATE HFA 108 (90 BASE) MCG/ACT IN AERS
4.00 | INHALATION_SPRAY | Freq: Once | RESPIRATORY_TRACT | Status: AC
Start: 2015-10-27 — End: 2015-10-27
  Administered 2015-10-27: 4 via RESPIRATORY_TRACT

## 2015-10-27 NOTE — Addendum Note (Signed)
Addended by: Laurey Arrow on: 10/27/2015 09:13 AM     Modules accepted: Orders, SmartSet

## 2015-10-31 ENCOUNTER — Telehealth (HOSPITAL_BASED_OUTPATIENT_CLINIC_OR_DEPARTMENT_OTHER): Payer: Self-pay | Admitting: Internal Medicine

## 2015-10-31 DIAGNOSIS — R809 Proteinuria, unspecified: Principal | ICD-10-CM

## 2015-10-31 DIAGNOSIS — E1129 Type 2 diabetes mellitus with other diabetic kidney complication: Secondary | ICD-10-CM

## 2015-10-31 MED ORDER — METFORMIN HCL ER (MOD) 1000 MG PO TB24
2000.0000 mg | ORAL_TABLET | Freq: Every evening | ORAL | 11 refills | Status: DC
Start: 2015-10-31 — End: 2015-12-12

## 2015-10-31 MED ORDER — LANCETS
11 refills | Status: DC
Start: 2015-10-31 — End: 2017-01-18

## 2015-10-31 MED ORDER — LANCETS
11 refills | Status: DC
Start: 2015-10-31 — End: 2015-10-31

## 2015-10-31 NOTE — Progress Notes (Signed)
Called patient to discuss - taking 2000mg  metformin ER qhs - as 500mg  tabs  Discussed change to 1000mg  tabs if possible   Refilled lancets  Shanikka Wonders P. Dash Cardarelli,MD, 10/31/2015, 6:17 PM

## 2015-10-31 NOTE — Procedures (Signed)
Date of Service: 10/27/2015    INDICATION FOR TESTING:  Bronchitis.      Please see media manager tab in EPIC by data.    FEV1 is 1.7 L, which is 74% of predicted.  FVC is 2.23 L, which is 77% of predicted.  FEV1 to FVC ratio ranges from 77%-82%.  There is no change with bronchodilator.    Lung volumes notable for a total lung capacity of 75% of predicted and a residual volume of 66% of predicted.    DLCO is 51% of predicted.  When adjusted for alveolar volume, this is 107% of predicted.    O2 saturation on room air at rest is 96% with a heart rate of 95.  At 500 feet, saturation dropped to 94% with a heart rate of 138.    IMPRESSION:  There is no obstructive dysfunction; however, there is a mild restrictive dysfunction.  Diffusion capacity is significantly reduced, although it normalized when adjusted for alveolar volume.  There is a minimal drop in saturation from 96% to 94% at 500 feet and there is an increase in heart rate to a peak of 138.    ___________________________  Reviewed and Electronically Signed By: Rachel Bo MD  Sig Date: 11/09/2015  Sig Time: 11:52:13  Dictated By: Rachel Bo MD  Dict Date: 10/31/2015 Dict Time: 03 32 PM    Dictation Date and Time:10/31/2015 15:32:37  Transcription Date and Time:10/31/2015 15:43:08  eScription Dictation id: [id number] Confirmation # [id number]      cc: Osa Craver MD

## 2015-10-31 NOTE — Progress Notes (Signed)
Patient is looking for new med order of metformin and lancets. Med's are not on med list.  Please review and create new med order, if refills are appropriate. Thank you.

## 2015-11-01 ENCOUNTER — Encounter (HOSPITAL_BASED_OUTPATIENT_CLINIC_OR_DEPARTMENT_OTHER): Payer: Self-pay | Admitting: Internal Medicine

## 2015-11-01 NOTE — Progress Notes (Signed)
[facility] b detailed written order for DM supplies   Completed, signed, will be faxed and scanned  Hazael Olveda P. Caitlen Worth,MD, 11/01/2015, 7:25 PM

## 2015-11-03 ENCOUNTER — Telehealth (HOSPITAL_BASED_OUTPATIENT_CLINIC_OR_DEPARTMENT_OTHER): Payer: Self-pay | Admitting: Internal Medicine

## 2015-11-03 MED ORDER — METFORMIN HCL ER 500 MG PO TB24
2000.0000 mg | ORAL_TABLET | Freq: Every day | ORAL | 11 refills | Status: DC
Start: 2015-11-03 — End: 2015-12-12

## 2015-11-03 NOTE — Progress Notes (Signed)
[facility] called [facility] stating her insurance does not cover the Metformin ER 1000mg . Please resend prescription for Metformin Er 500mg  to patients preferred pharmacy.

## 2015-11-07 ENCOUNTER — Ambulatory Visit: Payer: Self-pay | Admitting: Internal Medicine

## 2015-11-14 ENCOUNTER — Emergency Department (HOSPITAL_BASED_OUTPATIENT_CLINIC_OR_DEPARTMENT_OTHER)
Admission: RE | Admit: 2015-11-14 | Disposition: A | Discharge status: Home / Self Care | Payer: Self-pay | Source: Emergency Department | Attending: Physician Assistant | Admitting: Physician Assistant

## 2015-11-14 ENCOUNTER — Encounter (HOSPITAL_BASED_OUTPATIENT_CLINIC_OR_DEPARTMENT_OTHER): Payer: Self-pay

## 2015-11-14 LAB — XR WRIST RIGHT MINIMUM 3 VIEWS

## 2015-11-14 NOTE — ED Provider Notes (Signed)
eMERGENCY dEPARTMENT Physician Assistant NOTE    The ED nursing record was reviewed.   The prior medical records as available electronically through Epic were reviewed.  The mode of arrival was Self on 11/14/2015 11:49 AM.    CHIEF COMPLAINT    Patient presents with:  Wrist Pain: WRIST PAIN-ID SHOWN      HPI    ANGI RAMEL is a 59 year old female who presents with right-sided wrist pain.  Patient states has been ongoing past 2 months and over the past few days symptoms worsened and she is also having some weakness in her right wrist.  Patient states she's tried wrapping with an Ace wrap and also using Tylenol and had some relief.  Patient states the pain now radiates from her wrist to elbow.  Patient denies injury or trauma to her wrist along with numbness and tingling.    PAST MEDICAL HISTORY      Past Medical History    1St MTP arthritis     Comment: per steward records, xray 11/2011    Arthritis     Back pain     Bilateral knee pain     Comment: per steward records, mri - see scanned - tear meniscus right, politeal cyst, mcl sprain 06/2014, s/p left knee replacement. 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) ([place])     Left genital labial abscess     Obesity, Class III, 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y) ([place])     Tobacco use disorder     Spinal stenosis of lumbar region     HLD (hyperlipidemia)     Chronic bilateral low back pain without sciatica     Skin change     Type 2 diabetes mellitus without complication, without long-term current use of insulin (HCC)      Tachycardia     Pineal gland cyst     History of vitamin D deficiency     Hemorrhoids     Venous (peripheral) insufficiency     Osteopenia     Adrenal adenoma     Type 2 diabetes mellitus with microalbuminuria, without long-term current use of insulin ([place])     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 (HCC)     Acute nonintractable headache     Cough     Right ear impacted cerumen      SURGICAL HISTORY      Past Surgical History    LAPAROSCOPY SURG CHOLECYSTECTOMY      ANES NERVE MUSC TENDON FASCIA&BURSA KNEE&/POPLT      TONSILLECTOMY & ADENOIDECTOMY       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     Current Outpatient Prescriptions:   .  metFORMIN (GLUCOPHAGE-XR) 500 MG 24 hr tablet, Take 4 tablets by mouth daily with breakfast, Disp: 120 tablet, Rfl: 11  .  atorvastatin (LIPITOR) 20 MG tablet, Take 1 tablet by mouth daily, Disp: 90 tablet, Rfl: 3  .  metoprolol (TOPROL-XL) 25 MG 24 hr tablet, Take 0.5 tablets by mouth daily At bedtime- half pill, Disp: 15 tablet, Rfl: 12  .  cholecalciferol (VITAMIN D3) 1000 UNIT tablet, Take 1 tablet by mouth daily, Disp: , Rfl:   .  pregabalin (LYRICA) 100 MG capsule, Take 100 mg by mouth 2 (two) times daily With Dr Dwyane Dee, Disp: , Rfl:   .  oxycodone-acetaminophen (PERCOCET) 5-325 MG per tablet, Take 1 tablet by mouth 2 (two) times daily as needed With Dr Dwyane Dee, Disp: , Rfl:   .  LISINOPRIL OR, Take 20 mg by mouth daily., Disp: , Rfl:   .  metFORMIN (GLUMETZA) 1000 MG (MOD) 24 hr tablet, Take 2 tablets by mouth nightly, Disp: 60 tablet, Rfl: 11  .  Lancets, Use 1 time daily.  Dispense lancets that are covered by insurance. Dx e11.29, Disp: 100 each, Rfl: 11  .  albuterol (PROVENTIL HFA,VENTOLIN HFA, PROAIR HFA)  108 (90 BASE) MCG/ACT inhaler, Inhale 2 puffs into the lungs every 6 (six) hours as needed for Wheezing or Shortness of breath, Disp: 1 Inhaler, Rfl: 11  .  Omega-3 Fatty Acids (FISH OIL) 1000 MG CAPS, Take by mouth, Disp: , Rfl:   .  glucose blood (ONETOUCH VERIO) test strip, Use as instructed, Disp: 100 strip, Rfl: 11  .  fluticasone (FLONASE) 50 MCG/ACT nasal spray, 1 spray by Each Nostril route daily., Disp: , Rfl:   .  aspirin 81 MG tablet, Take 81 mg by mouth daily., Disp: , Rfl:     ALLERGIES    Review of Patient's Allergies indicates:   Motrin [ibuprofen]      Nausea Only   Augmentin [amoxicil*    Nausea and Vomiting   Codeine camsylate          Bactrim                 Rash, Itching    FAMILY HISTORY      Family History    Diabetes Mother     Heart Brother     Comment: CAD and AAA    Hypertension Mother     Arthritis Mother     Stroke Mother     Diabetes Brother     Comment: same brother    Cancer - Other Father     Comment: type uncertain    Thyroid Sister     Alcohol/Drug Abuse Son     OTHER Son     Comment: ?brain aneurysm per steward records    No Known Problems Grandchild     Comment: 6    Cancer - Breast FamHxNeg     Cancer - Cervical FamHxNeg     Cancer - Ovarian FamHxNeg     Cancer - Colon FamHxNeg     Psychiatric Illness FamHxNeg        SOCIAL HISTORY    Social History    Marital status: Divorced            Spouse name:  Years of education:                 Number of children:               Social History Main Topics    Smoking status: Current Every Day Smoker                                                     Packs/day: 0.50      Years: 31.00       Comment: quit smoking inform provided to patient    Alcohol use: No              Drug use: No              Sexual activity: Not Currently     Partners with: Female       Birth control/protection: Tubal Ligation    Social History Narrative    Lives with son    1 daughter and another son passed away    Disabled from back    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 Simons,MD, 07/15/2015, 2:56 PM             REVIEW OF SYSTEMS     The pertinent positives are reviewed in the HPI above. All other systems were reviewed and are negative.    PHYSICAL EXAM      Vital Signs: BP 112/74  Pulse 95  Temp 97.9 F  Resp 18  Wt 106.6 kg (235 lb)  LMP 10/22/2007  SpO2 98%  BMI 44.66 kg/m2   Pain Score: Pain Score: 10 (10/10)  Constitutional: Well-developed, Well-nourished, Non-toxic appearance. Speaking full sentences.    Distress: NAD  HEAD: NCAT; Without signs of trauma. No soft tissue swelling or tenderness.   MUSCULOSKELETAL : Moving all 4 extremities. Ambulatory w/ a steady gait. The spine straight and nontender.   Right Wrist/Hand: TTP dorsal wrist/carpal tunnel with tendon tenderness from the wrist to elbow, No bony tenderness. No snuffbox tenderness. No edema, erythema, ecchymosis or calor. No joint laxity. FAROM. Sensation to light tough intact. Grip 4/5; Cap refill <3 seconds.  SKIN: Warm and dry, no rash . The skin color and turgor are normal.   NEUROLOGIC: Normal mental status. Cranial nerves, motor, sensor, DTRs, and cerebellum are grossly intact.   PSYCHIATRIC: Normal affect      RESULTS  No results found for this visit on 11/14/15 (from the past 24 hour(s)).     [facility] [place], [place] [address] [phone] Department of Diagnostic Radiology PATIENT: SELLA, BUYS I DOB: 1956/08/06 AGE: 49 SEX: F ACCT#: [id number] LOCATION: [facility] UNIT#: [id number] STATUS: REG ER ORD PHY: Derwood Kaplan PA-C Exam Date: 11/14/15 Exam Status: Signed Exam: WRIST, RIGHT MIN 3 VIEWS Reason for Exam: pain, limited range of motion History: Pain, limited range of motion. Findings: 3 views of the right wrist. No  fracture or dislocation. Unremarkable carpal alignment. Normal joint space. Normal soft tissues. Impression:  No fracture or dislocation of the right wrist. Dictated By: Tia Alert MD Reviewed and Electronically Signed By: Tia Alert, MD Technologist: [phone] Signed Date/Time: 11/14/15 1322 Transcribed Date/Time: 11/14/15 1321 by [facility] Printed Date/Time: Report #: [id number] Adde  ndum Transcribed Date/Time: / Addendum Signed Date/Time CC      MEDICATIONS ADMINISTERED ON THIS VISIT  No orders of the defined types were placed in this encounter.      ED COURSE & MEDICAL DECISION MAKING      I reviewed the patient's past medical history/problem list, past surgical history, medication list, social history and allergies.    Arrival: Pt arrived in stable condition and required no immediate interventions.    ED Decision Making & Course: Pt is a 59 year old female with right wrist pain.  S/sx consistent with tendon strain.  Right wrist x-ray showed no acute fractures or soft tissue swelling.  Patient was fitted with a Velcro wrist splint, given instructions on RICE protocol, and given follow-up instructions with orthopedics.  Patient was instructed to call orthopedics if she is not contacted next 2 days by them.  Reasons to return to the ED reviewed in detail, the patient understands this plan and disposition.  Pt remained hemodynamically stable during their stay in the emergency department.     Follow Up: Orthopedics    Diagnoses:  Acute wrist pain, right    Condition: Stable, improved  Disposition: Home    Derwood Kaplan, PA-C

## 2015-11-14 NOTE — Narrator Note (Signed)
Wrist splint applied with instructions. Verbalizes full understanding

## 2015-11-14 NOTE — ED Triage Note (Signed)
Pt ambulated in to ED with CC R wrist pain x 2 months. Denies injury. States that she tried using an ACE wrap, ice and heat, tylenol without effect. States that over the last few days sx worsened and she started with weakness. States yesterday she was carrying something and it just fell out of her hand, "I lost feeling and the glass just fell" c/o stiffness in hand and wrist. Pain from elbow down to hand now whereas before it was just in wrist down.

## 2015-11-14 NOTE — Narrator Note (Signed)
Steady gait to radiology.

## 2015-11-14 NOTE — Narrator Note (Signed)
Patient Disposition    Patient education for diagnosis, medications, activity, diet and follow-up.  Patient left ED 2:01 PM.  Patient rep received written instructions.  Interpreter to provide instructions: No    Patient belongings with patient: YES    Have all existing LDAs been addressed? N/A    Have all IV infusions been stopped? N/A    Discharged to: Discharged to home

## 2015-11-14 NOTE — Discharge Instructions (Signed)
Return to the Emergency Department at any time if worse, for new or different symptoms, or unable to arrange follow-up as advised.    Call and schedule first available follow-up appointment with your primary care doctor to get a  referral to occupational health.

## 2015-11-24 ENCOUNTER — Encounter (HOSPITAL_BASED_OUTPATIENT_CLINIC_OR_DEPARTMENT_OTHER): Payer: Self-pay | Admitting: Physician Assistant

## 2015-11-24 DIAGNOSIS — M25531 Pain in right wrist: Secondary | ICD-10-CM | POA: Insufficient documentation

## 2015-12-12 ENCOUNTER — Other Ambulatory Visit (HOSPITAL_BASED_OUTPATIENT_CLINIC_OR_DEPARTMENT_OTHER): Payer: Self-pay | Admitting: Internal Medicine

## 2015-12-12 ENCOUNTER — Ambulatory Visit (HOSPITAL_BASED_OUTPATIENT_CLINIC_OR_DEPARTMENT_OTHER): Payer: MEDICARE | Admitting: Internal Medicine

## 2015-12-12 ENCOUNTER — Telehealth (HOSPITAL_BASED_OUTPATIENT_CLINIC_OR_DEPARTMENT_OTHER): Payer: Self-pay | Admitting: Dermatology

## 2015-12-12 ENCOUNTER — Encounter (HOSPITAL_BASED_OUTPATIENT_CLINIC_OR_DEPARTMENT_OTHER): Payer: Self-pay | Admitting: Internal Medicine

## 2015-12-12 VITALS — BP 95/72 | HR 102 | Temp 98.2°F | Wt 231.0 lb

## 2015-12-12 DIAGNOSIS — R238 Other skin changes: Secondary | ICD-10-CM

## 2015-12-12 DIAGNOSIS — E119 Type 2 diabetes mellitus without complications: Secondary | ICD-10-CM

## 2015-12-12 DIAGNOSIS — E1129 Type 2 diabetes mellitus with other diabetic kidney complication: Secondary | ICD-10-CM

## 2015-12-12 DIAGNOSIS — D3502 Benign neoplasm of left adrenal gland: Secondary | ICD-10-CM

## 2015-12-12 DIAGNOSIS — M8580 Other specified disorders of bone density and structure, unspecified site: Secondary | ICD-10-CM

## 2015-12-12 DIAGNOSIS — F17200 Nicotine dependence, unspecified, uncomplicated: Secondary | ICD-10-CM

## 2015-12-12 DIAGNOSIS — R942 Abnormal results of pulmonary function studies: Secondary | ICD-10-CM

## 2015-12-12 DIAGNOSIS — R809 Proteinuria, unspecified: Secondary | ICD-10-CM

## 2015-12-12 DIAGNOSIS — Z1239 Encounter for other screening for malignant neoplasm of breast: Secondary | ICD-10-CM

## 2015-12-12 MED ORDER — METFORMIN HCL ER 500 MG PO TB24
2000.0000 mg | ORAL_TABLET | Freq: Every day | ORAL | 11 refills | Status: DC
Start: 2015-12-12 — End: 2017-01-05

## 2015-12-12 NOTE — Progress Notes (Signed)
Thank you for referring your patient to me with use of the teledermatology system. I am basing my recommendations upon the information provided by the referring physician, including images, and upon review of the problem list, medications, and allergies as documented in the electronic medical record. I have not had the benefit of personally interviewing or physically examining the patient.    Consultation request:   60 year old woman with diabetes and 3 weeks of pain lateral to right eye - has area of skin irritation lateral to lateral eye and skin tag below this      Image interpretation:   There are three clinical images provided:  - the left eye is grossly normal appearing  -On the R lateral canthus there is a pink patch with overlying scale   - There is a 99mm fleshy skin colored to pink-brown pedunculated papule adjacent to the lateral canthus      Assessment:   60 year old woman referred via teledermatology service for consultation regarding 3 weeks of irritation lateral to the R eyelid as well as skin colored pedunculated papule. In the provided photographs there is an ill defined pink macule with overlying desquamative scale suggestive of a mild eczematous dermatitis. The etiology of this dermatitis is uncertain. The clinical differential diagnosis for eyelid dermatitis includes a contact dermatitis, atopic dermatitis, and less likely etiologies such as a conjunctivitis. Allergic contact dermatitis frequently manifests on the eyelids where the skin is very thin. Common causes of allergic contact dermatitis can include manicured nails (glues, acrylic paints, adhesives, lacquers), cosmetic products, and perfumed. Obtaining further history regarding any new potentially triggering contactants can be helpful to potentially identify a culprit allergen.      There is a skin colored papule lateral to the canthus suggestive of a pedunculated skin tag.    Recommendations:   - Given suspicion for eyelid dermatitis  would apply hydrocortisone cream 2.5% 1-2 times daily for 5-7 days.  Would review side effects of inappropriate prolonged topical steroid use such as glaucoma, cataracts, and skin thinning  - Would obtain further history regarding recent manicures or other recent newly used cosmetic products which may be potential sources of allergic contact dermatitis  - Use gentle skin care products such as cetaphil facial soap, or cerave facial moisturizer   - In the event that symptoms persist please consider repeat teledermatology consult vs referral for in person evaluation    Please let me know whether I can be of further assistance and please let me know how the patient responds to your management.  Sincerely yours,

## 2015-12-12 NOTE — Progress Notes (Signed)
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) ([place])     Left genital labial abscess     Tobacco use disorder     Spinal stenosis of lumbar region     HLD (hyperlipidemia)     Chronic bilateral low back pain without sciatica     Skin change     Tachycardia     Pineal gland cyst     History of vitamin D deficiency     Hemorrhoids     Venous (peripheral) insufficiency     Osteopenia     Adrenal adenoma     Type 2 diabetes mellitus with microalbuminuria, without long-term current use of insulin ([place])     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 (HCC)     Acute nonintractable headache     Cough     Right ear impacted cerumen     Acute pain of right wrist    Reports breathing has improved  Not using her albuterol  frequently  Denies SOB  Smokes less than a pack of cigarettes daily  Cutting down on cigarettes    12/21 pfts "IMPRESSION: There is no obstructive dysfunction; however, there is a mild restrictive dysfunction. Diffusion capacity is significantly reduced, although it normalized when adjusted for alveolar volume. There is a minimal drop in saturation from 96% to 94% at 500 feet and there is an increase in heart rate to a peak of 138."    Scabs around her right eye causing her discomfort  Notes a skin tag in the area present for years and thinks this is the cause of discomfort  Describes the scab "as a weight " around eye and present past 3 weeks  Recently noticed a change at site of scab  Says the area is sore when she blinks  Denies picking at the area of skin tag    Not aware that she was dx with osteopenia  Was dx with osteoarthritis in the past  Denies having a bone density test in the past    H/o growth in her adrenal gland  Had urine testing  done at the time of growth discovery and never followed up with further labs    Mammogram scheduled for  the 3/10    Does not know how she was diagnosed with diabetes  Initially was told that she was borderline diabetic and started on medication    HEMOGLOBIN A1C (%)   Date Value   07/15/2015 6.2 (H)     Most Recent BP Reading(s)  12/12/15 : 95/72  11/14/15 : 112/74  10/17/15 : 120/74  09/26/15 : 112/79  09/14/15 : 108/77    Most Recent Weight Reading(s)  12/12/15 : 104.8 kg (231 lb)  11/14/15 : 106.6 kg (235 lb)  10/17/15 : 106.6 kg (235 lb)  09/26/15 : 107 kg (236 lb)  09/14/15 : 107.5 kg (237 lb)    PE:  BP 95/72  Pulse 102  Temp 98.2 F (36.8 C) (Oral)  Wt 104.8 kg (231 lb)  LMP 10/22/2007  SpO2 98%  BMI 43.9 kg/m2  Estimated body mass index is 43.9 kg/(m^2) as calculated from the following:    Height as of 08/15/15: 5' 0.83" (1.545 m).    Weight as of this encounter: 104.8 kg (231 lb).  Gen - morbidly obese female upright in  chair in NAD  HEENT - MMM  1-2 mm lesion adjacent to the right eye  CV - RRR, no m/r/g  Resp - CTAB  Ext - Warm, dry  Neuro - A+Ox3  Psych - Appropriate    A/P:  (R94.2) Abnormal PFTs  (primary encounter diagnosis)  Comment: Restriction seen on pfts - ?from weight of chest wall, weight loss as below  Plan: Discussed  If not improving, consider pulm referral    (F17.200) Tobacco use disorder  Comment: Contemplative  Plan: Smoking cessation counseling provided    (E66.01) 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 (HCC)  Comment: By BMI  Plan: Discussed portion control, healthy food choice, exercise    (E11.29,  R80.9) Type 2 diabetes mellitus with microalbuminuria, without long-term current use of insulin (HCC)  Comment: Controlled   Plan: metFORMIN (GLUCOPHAGE-XR) 500 MG 24 hr tablet  Pt will begin taking 4 tablets of Metformin QD    (Z12.39) Screening for malignant neoplasm of breast  Plan: [facility] SCREENING BILATERAL W CAD          (R23.8) Skin irritation  Comment: dermatitis adjacent to eye  Plan: REFERRAL TO TELEDERMATOLOGY  Pt will be contacted  by specalist regarding skin  irritation          (M85.80) Osteopenia  Comment: recheck bone density  Plan: XR BONE DENSITOMETRY, LUMBAR/HIPS  Will follow up with specalist regarding  osteopenia          (D35.02) Adrenal adenoma, left  Comment: patient reports had workup, wait for records  Plan: Discussed    By signing my name below, I , Comfort Oriakhi,attest that this documentation has been prepared under the direction and in the presence of Osa Craver,  MD  Electronically signed: Earleen Newport,  Medical Scribe, 12/12/2015, 2:15 PM    I, Osa Craver, personally performed the services described in this documentation. All medical record entries made by the scribe were at my direction and in my presence. I have reviewed the chart and discharge instructions (if applicable) and agree that the record reflects my personal performance and is accurate and complete.   Emri Sample P. Avigail Pilling,MD, 12/20/2015, 7:55 PM

## 2015-12-17 ENCOUNTER — Telehealth (HOSPITAL_BASED_OUTPATIENT_CLINIC_OR_DEPARTMENT_OTHER): Payer: Self-pay

## 2015-12-17 NOTE — Progress Notes (Signed)
[facility]    [facility] PT-1 Request    PT-1 Form Summary   Your PT-1 request(s) have been submitted. Please print this screen using your browser print button for confirmation of your submission or keep the tracking number available for your records.   Treater Name:  mammogram  Tracking #: [id number]    Member Name:  Samantha Cameron, Samantha Cameron  Status: Pending    RequestType:  New Form  Created: 12/17/2015 9:31:00 AM       Facility Location:  Member Pickup Address:    [address], [address], [place], [place], [address]    [phone]  [place] within [place]:  - No   Requested Service:  - Radiology/ mammogram   Duration of Services:  - 12 MONTHS   Frequency of Services:  - 4 visit(s) per MONTH   Wheelchair van needed:  - No   Escort accompanying the member:  - Yes   Service Animal needed: - No

## 2015-12-17 NOTE — Progress Notes (Signed)
[facility] PT-1 Request    PT-1 Form Summary   Your PT-1 request(s) have been submitted. Please print this screen using your browser print button for confirmation of your submission or keep the tracking number available for your records.   Treater Name:  [facility]  Tracking #: [id number]    Member Name:  [name] I  Status: Pending    RequestType:  New Form  Created: 12/17/2015 9:27:00 AM       Facility Location:  Member Pickup Address:    [address], [address], [place], [place], [address]    [phone]  [place] within [place]:  - No   Requested Service:  - Radiology   Duration of Services:  - 12 MONTHS   Frequency of Services:  - 4 visit(s) per MONTH   Wheelchair van needed:  - No   Escort accompanying the member:  - Yes   Service Animal needed: - No

## 2015-12-20 DIAGNOSIS — R942 Abnormal results of pulmonary function studies: Secondary | ICD-10-CM | POA: Insufficient documentation

## 2015-12-20 DIAGNOSIS — R238 Other skin changes: Secondary | ICD-10-CM | POA: Insufficient documentation

## 2015-12-20 DIAGNOSIS — Z1239 Encounter for other screening for malignant neoplasm of breast: Secondary | ICD-10-CM

## 2015-12-21 ENCOUNTER — Ambulatory Visit (HOSPITAL_BASED_OUTPATIENT_CLINIC_OR_DEPARTMENT_OTHER): Payer: Self-pay | Admitting: Internal Medicine

## 2015-12-21 ENCOUNTER — Telehealth (HOSPITAL_BASED_OUTPATIENT_CLINIC_OR_DEPARTMENT_OTHER): Payer: Self-pay | Admitting: Internal Medicine

## 2015-12-21 DIAGNOSIS — R239 Unspecified skin changes: Secondary | ICD-10-CM

## 2015-12-21 DIAGNOSIS — Z79899 Other long term (current) drug therapy: Secondary | ICD-10-CM

## 2015-12-21 DIAGNOSIS — Z780 Asymptomatic menopausal state: Secondary | ICD-10-CM

## 2015-12-21 DIAGNOSIS — Z13820 Encounter for screening for osteoporosis: Secondary | ICD-10-CM

## 2015-12-21 MED ORDER — HYDROCORTISONE 2.5 % EX CREA
TOPICAL_CREAM | Freq: Two times a day (BID) | CUTANEOUS | 0 refills | Status: AC
Start: 2015-12-21 — End: 2015-12-28

## 2015-12-21 NOTE — Progress Notes (Signed)
Called to discuss telederm recs  "Assessment:  60 year old woman referred via teledermatology service for consultation regarding 3 weeks of irritation lateral to the R eyelid as well as skin colored pedunculated papule. In the provided photographs there is an ill defined pink macule with overlying desquamative scale suggestive of a mild eczematous dermatitis. The etiology of this dermatitis is uncertain. The clinical differential diagnosis for eyelid dermatitis includes a contact dermatitis, atopic dermatitis, and less likely etiologies such as a conjunctivitis. Allergic contact dermatitis frequently manifests on the eyelids where the skin is very thin. Common causes of allergic contact dermatitis can include manicured nails (glues, acrylic paints, adhesives, lacquers), cosmetic products, and perfumed. Obtaining further history regarding any new potentially triggering contactants can be helpful to potentially identify a culprit allergen.     There is a skin colored papule lateral to the canthus suggestive of a pedunculated skin tag.    Recommendations:  - Given suspicion for eyelid dermatitis would apply hydrocortisone cream 2.5% 1-2 times daily for 5-7 days. Would review side effects of inappropriate prolonged topical steroid use such as glaucoma, cataracts, and skin thinning  - Would obtain further history regarding recent manicures or other recent newly used cosmetic products which may be potential sources of allergic contact dermatitis  - Use gentle skin care products such as cetaphil facial soap, or cerave facial moisturizer   - In the event that symptoms persist please consider repeat teledermatology consult vs referral for in person evaluation"    rx sent for [facility] cream  Sunni Richardson P. Jamirra Curnow,MD, 12/21/2015, 3:13 PM

## 2016-01-04 NOTE — Addendum Note (Signed)
Addended by: Trellis Paganini on: 01/04/2016 01:09 PM     Modules accepted: Orders

## 2016-01-05 LAB — XR DXA BONE DENSITOMETRY

## 2016-01-24 ENCOUNTER — Ambulatory Visit (HOSPITAL_BASED_OUTPATIENT_CLINIC_OR_DEPARTMENT_OTHER): Payer: MEDICARE | Admitting: Internal Medicine

## 2016-01-24 ENCOUNTER — Encounter (HOSPITAL_BASED_OUTPATIENT_CLINIC_OR_DEPARTMENT_OTHER): Payer: Self-pay | Admitting: Internal Medicine

## 2016-01-24 VITALS — BP 90/57 | HR 110 | Temp 96.9°F

## 2016-01-24 DIAGNOSIS — B9689 Other specified bacterial agents as the cause of diseases classified elsewhere: Secondary | ICD-10-CM

## 2016-01-24 DIAGNOSIS — H9201 Otalgia, right ear: Secondary | ICD-10-CM

## 2016-01-24 DIAGNOSIS — R238 Other skin changes: Secondary | ICD-10-CM

## 2016-01-24 DIAGNOSIS — R000 Tachycardia, unspecified: Secondary | ICD-10-CM

## 2016-01-24 DIAGNOSIS — J019 Acute sinusitis, unspecified: Secondary | ICD-10-CM

## 2016-01-24 DIAGNOSIS — F17200 Nicotine dependence, unspecified, uncomplicated: Secondary | ICD-10-CM

## 2016-01-24 DIAGNOSIS — E1129 Type 2 diabetes mellitus with other diabetic kidney complication: Secondary | ICD-10-CM

## 2016-01-24 DIAGNOSIS — Z9289 Personal history of other medical treatment: Secondary | ICD-10-CM

## 2016-01-24 DIAGNOSIS — R809 Proteinuria, unspecified: Secondary | ICD-10-CM

## 2016-01-24 MED ORDER — DOXYCYCLINE HYCLATE 100 MG PO TABS
100.00 mg | ORAL_TABLET | Freq: Two times a day (BID) | ORAL | 0 refills | Status: AC
Start: 2016-01-24 — End: 2016-01-31

## 2016-01-24 NOTE — Progress Notes (Signed)
Samantha Cameron is a 60 year old female here for right ear pain    Patient Active Problem List:     Left genital labial abscess     Tobacco use disorder     Spinal stenosis of lumbar region     HLD (hyperlipidemia)     Chronic bilateral low back pain without sciatica     Skin change     Tachycardia     Pineal gland cyst     History of vitamin D deficiency     Hemorrhoids     Venous (peripheral) insufficiency     Osteopenia     Adrenal adenoma     Type 2 diabetes mellitus with microalbuminuria, without long-term current use of insulin ([place])     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 (HCC)     Acute nonintractable headache     Cough     Right ear impacted cerumen     Acute pain of right wrist     Abnormal PFTs     Screening for malignant neoplasm of breast     Skin irritation    Sinuses has been "on and off" for weeks  Improved after taking sudafed and then sx began again  - ongoing for a month  Right ear pain began yesterday  Notes sneezing , HA, chills  No current measured fevers, bloody discharge    DM:  First prescription of metformin was august 2009  Will schedule dental appointment  HEMOGLOBIN A1C (%)   Date Value   07/15/2015 6.2 (H)     Pt said smoking is doing good  Smokes about half a pack daily  Most Recent BP Reading(s)  01/24/16 : 90/57  12/12/15 : 95/72  11/14/15 : 112/74  10/17/15 : 120/74  09/26/15 : 112/79    Monitors what she eats and exercises  Most Recent Weight Reading(s)  12/12/15 : 104.8 kg (231 lb)  11/14/15 : 106.6 kg (235 lb)  10/17/15 : 106.6 kg (235 lb)  09/26/15 : 107 kg (236 lb)  09/14/15 : 107.5 kg (237 lb)    Reports she had cramps in her leg  Ask if the cramps she felt is diabetes related    Dropped off lab information re: prior adrenal adenoma workup today - will be reviewed    Had recent bone density test - normal bone density - no clear osteopenia    PE:  BP 90/57  Pulse 110  Temp 96.9 F (36.1 C) (Temporal)  LMP 10/22/2007  SpO2 97%  Estimated body mass  index is 43.9 kg/(m^2) as calculated from the following:    Height as of 08/15/15: 5' 0.83" (1.545 m).    Weight as of 12/12/15: 104.8 kg (231 lb).  Gen - Morbidly obese female upright in chair in NAD  HEENT - MMM, left maxillary tenderness TTP, no mastoid or tragal tenderness of the right ear, normal canal and TM  CV - Tachycardic, normal rhythm, no m/r/g  Resp - CTAB  Ext - Warm, dry  Neuro - A+Ox3  Psych - Appropriate    A/P:  (J01.90,  B96.89) Acute bacterial sinusitis  (primary encounter diagnosis)  Comment: Given length of sx, double sickening - concern for bacterial.  Will treat with antibiotic but given listed augmentin reaction (which patient is unsure of) - alternate class used  Plan: Will begin taking a tablet of doxycycline BID  Discussed supportive care of rest, hydration,water and  tea with honey  Advised to  contact or return to the clinic if sx of chills, HA, persists or worsnes    (H92.01) Right ear pain  Comment: Likely referred pain with above  Plan:  Monitor    (F17.200) Tobacco use disorder  Comment: Ongoing, contemplative, cutting down  Plan: Smoking cessation counseling provided    (Z92.89) H/O bone density study  Comment: No clear osteopenia  Plan: Discussed    (E11.29,  R80.9) Type 2 diabetes mellitus with microalbuminuria, without long-term current use of insulin (HCC)  Comment: Controlled, continue current regimen, lifestyle change, patient to make dental appt  Plan: COLLECTION VENOUS BLOOD VENIPUNCTURE,         HEMOGLOBIN A1C          (E66.01) 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 (HCC)  Comment: BY BMI, improving  Plan: Discussed portion control, healthy food choice, exercise    (R00.0) Tachycardia  Comment: Mild - may be with illness as above  Plan: Monitor    (R23.8) Skin irritation  Comment: irritation near right eye treated with steroid cream has resolved  Plan: monitored    By signing my name below, I , Comfort Oriakhi,attest that this documentation has been prepared under  the direction and in the presence of Osa Craver,  MD  Electronically signed: Earleen Newport, Medical Scribe 01/24/16 2:48 PM      I, Osa Craver, personally performed the services described in this documentation. All medical record entries made by the scribe were at my direction and in my presence. I have reviewed the chart and discharge instructions (if applicable) and agree that the record reflects my personal performance and is accurate and complete.   Deanne Bedgood P. Cataleah Stites,MD, 01/27/2016, 7:10 AM

## 2016-01-25 LAB — HEMOGLOBIN A1C
ESTIMATED AVERAGE GLUCOSE: 128 (ref 74–160)
HEMOGLOBIN A1C: 6.1 % — ABNORMAL HIGH (ref 4.0–5.6)

## 2016-01-27 ENCOUNTER — Ambulatory Visit: Payer: Self-pay | Admitting: Internal Medicine

## 2016-01-27 DIAGNOSIS — Z9289 Personal history of other medical treatment: Secondary | ICD-10-CM

## 2016-01-27 DIAGNOSIS — Z1239 Encounter for other screening for malignant neoplasm of breast: Secondary | ICD-10-CM

## 2016-01-27 NOTE — Addendum Note (Signed)
Addended by: Evette Georges on: 01/27/2016 12:57 PM     Modules accepted: Orders

## 2016-02-03 LAB — MA NON ~~LOC~~ IMAGES

## 2016-02-03 LAB — MA SCREENING MAMMO BILATERAL DIGITAL WITH DBT & CAD

## 2016-03-26 ENCOUNTER — Encounter (HOSPITAL_BASED_OUTPATIENT_CLINIC_OR_DEPARTMENT_OTHER): Payer: Self-pay | Admitting: Internal Medicine

## 2016-03-26 ENCOUNTER — Ambulatory Visit (HOSPITAL_BASED_OUTPATIENT_CLINIC_OR_DEPARTMENT_OTHER): Payer: MEDICARE | Admitting: Internal Medicine

## 2016-03-26 VITALS — BP 126/58 | HR 111 | Temp 98.4°F | Wt 228.0 lb

## 2016-03-26 DIAGNOSIS — R519 Headache, unspecified: Secondary | ICD-10-CM

## 2016-03-26 DIAGNOSIS — E1129 Type 2 diabetes mellitus with other diabetic kidney complication: Secondary | ICD-10-CM

## 2016-03-26 DIAGNOSIS — R809 Proteinuria, unspecified: Secondary | ICD-10-CM

## 2016-03-26 DIAGNOSIS — F17200 Nicotine dependence, unspecified, uncomplicated: Secondary | ICD-10-CM

## 2016-03-26 DIAGNOSIS — R51 Headache: Secondary | ICD-10-CM

## 2016-03-26 DIAGNOSIS — R197 Diarrhea, unspecified: Secondary | ICD-10-CM

## 2016-03-26 DIAGNOSIS — H9202 Otalgia, left ear: Secondary | ICD-10-CM

## 2016-03-26 MED ORDER — LORATADINE 10 MG PO TABS
10.00 mg | ORAL_TABLET | Freq: Every day | ORAL | 0 refills | Status: AC
Start: 2016-03-26 — End: 2016-04-25

## 2016-03-26 NOTE — Progress Notes (Signed)
Samantha Cameron is a 60 year old female here for ear blockage and headache.    Patient Active Problem List:     Left genital labial abscess     Tobacco use disorder     Spinal stenosis of lumbar region     HLD (hyperlipidemia)     Chronic bilateral low back pain without sciatica     Skin change     Tachycardia     Pineal gland cyst     History of vitamin D deficiency     Hemorrhoids     Venous (peripheral) insufficiency     Adrenal adenoma     Type 2 diabetes mellitus with microalbuminuria, without long-term current use of insulin ([name])     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 (HCC)     Acute nonintractable headache     Cough     Right ear impacted cerumen     Acute pain of right wrist     Abnormal PFTs     Screening for malignant neoplasm of breast     Skin irritation     H/O bone density study    Stomach and head ache  Two days ago started sneezing and had body aches  Has had diarrhea and stomach pains for 2 days   Has throbbing pain on entire head (used flonaze 3x within 2 days) and Tylenol   Has never taken allergy medications before  No blood in stool, no dysuria, no vaginal issues      Ear Pain  Pain in left ear was present yesterday but today is better  Does not have pressure in the ear  Several years ago had migraines and previous physician said she had an ear infection  Uses ear drops    Smoking   Decreased smoking  Smoking half a pack a day  Yesterday did not smoke a single cigarette    Went to dentist last month where she had cavities present    Most Recent BP Reading(s)  03/26/16 : 126/58  01/24/16 : 90/57  12/12/15 : 95/72  11/14/15 : 112/74  10/17/15 : 120/74      Most Recent Weight Reading(s)  03/26/16 : 103.4 kg (228 lb)  12/12/15 : 104.8 kg (231 lb)  11/14/15 : 106.6 kg (235 lb)  10/17/15 : 106.6 kg (235 lb)  09/26/15 : 107 kg (236 lb)      PE:  BP 126/58   Pulse 111   Temp 98.4 F (36.9 C) (Temporal)   Wt 103.4 kg (228 lb)   LMP 10/22/2007   SpO2 98%   BMI 43.33  kg/m2  Estimated body mass index is 43.33 kg/(m^2) as calculated from the following:    Height as of 08/15/15: 5' 0.83" (1.545 m).    Weight as of this encounter: 103.4 kg (228 lb).  Gen - Morbidly obese female upright in chair in NAD  HEENT - MMM, B/l minimal wax in the canal, TM normal b/l  Neck- No cervical adenopathy   CV - RRR, no m/r/g  Resp - CTAB  Abd - +BS, soft, NT/ND  Ext - Warm, dry  Neuro - A+Ox3  Psych - Appropriate  Foot exam- Right and Left foot: Intact sensation to mono-filament test. DP, PT pulses 2/2 b/l, no lesions seen on feel b/l,  Calluses on second toes b/l    A/P:  Left ear pain  (primary encounter diagnosis)  Nonintractable headache, unspecified chronicity pattern, unspecified headache type  Diarrhea,  unspecified type  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 (HCC)  Type 2 diabetes mellitus with microalbuminuria, without long-term current use of insulin (HCC)  Tobacco use disorder    (H92.02) Left ear pain  (primary encounter diagnosis)  (R51) Nonintractable headache, unspecified chronicity pattern, unspecified headache type  Comment: No clear ear infection, may be allergy/ETD related  Plan: loratadine (CLARITIN) 10 MG tablet, continue taking Flonaze, advised to drink 7-8 cups of water a day, apply heat packs to the face if needed, and to get enough sleep a night    (E66.01) 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 ([name])  Comment: By BMI  Plan: Discussed portion control, healthy food choice, exercise    (E11.29,  R80.9) Type 2 diabetes mellitus with microalbuminuria, without long-term current use of insulin ([name])  Comment: Controlled  Plan: Continue taking medications as prescribed lifestyle change    (F17.200) Tobacco use disorder  Comment: Contemplative  Plan: Advised for the patient to think about a quit date to help fully cut out cigarettes      By signing my name below, I, Bertram Gala, attest that this document has been prepared under the direction and in the presence  of Melchizedek Espinola P. [name], M.D.  Electronically Signed: Bertram Gala, Medical Scribe. 03/26/16 6:15 PM    I, Osa Craver, personally performed the services described in this documentation. All medical record entries made by the scribe were at my direction and in my presence. I have reviewed the chart and discharge instructions (if applicable) and agree that the record reflects my personal performance and is accurate and complete.   Adjoa Althouse P. Kalya Troeger,MD, 04/18/2016, 8:12 PM

## 2016-04-06 ENCOUNTER — Telehealth (HOSPITAL_BASED_OUTPATIENT_CLINIC_OR_DEPARTMENT_OTHER): Payer: Self-pay | Admitting: Ambulatory Care

## 2016-04-06 ENCOUNTER — Other Ambulatory Visit (HOSPITAL_BASED_OUTPATIENT_CLINIC_OR_DEPARTMENT_OTHER): Payer: Self-pay | Admitting: Internal Medicine

## 2016-04-06 MED ORDER — LISINOPRIL 20 MG PO TABS
20.0000 mg | ORAL_TABLET | Freq: Every day | ORAL | 11 refills | Status: DC
Start: 2016-04-06 — End: 2016-05-25

## 2016-04-06 NOTE — Progress Notes (Signed)
PER Patient (self), Samantha Cameron is a 60 year old female has requested a refill of Lisinopril 20 mg.      Last Office Visit: 03/26/16 with Dr. [name]  Last Physical Exam: 08/15/15      Other Med Adult:  Most Recent BP Reading(s)  03/26/16 : 126/58          Cholesterol (mg/dL)   Date Value   07/15/2015 162   ----------    LOW DENSITY LIPOPROTEIN DIRECT (mg/dL)   Date Value   07/15/2015 75   ----------    HIGH DENSITY LIPOPROTEIN (mg/dL)   Date Value   07/15/2015 49   ----------    TRIGLYCERIDES (mg/dl)   Date Value   02/08/2009 189 (H)   ----------        THYROID SCREEN TSH REFLEX FT4 (uIU/mL)   Date Value   07/15/2015 1.170   ----------      No results found for: TSH      HEMOGLOBIN A1C (%)   Date Value   01/24/2016 6.1 (H)   ----------        INR (no units)   Date Value   02/01/2012 < 1.0 (L)   02/07/2009 1.0 (L)   06/16/2007 1.0 (L)   ----------      SODIUM (mmol/L)   Date Value   07/15/2015 139   ----------      POTASSIUM (mmol/L)   Date Value   07/15/2015 4.6   ----------          CREATININE (mg/dL)   Date Value   07/15/2015 0.6   ----------    Documented patient preferred pharmacies:    [facility] - [place], [place]  Phone: [phone] Fax: [phone]

## 2016-04-06 NOTE — Progress Notes (Signed)
Spoke with pt who said she has been having HA for some time and was given Claritin  She said she was told not to take too much tylenol    Pt said HA 7/10  Denies blurred vision or dizziness  She said she bought excedrin  Advised she stop the excedrin d/t ibuprofen allergy    Advised 1 gm of tylenol for starters 3 times a day and f/u appt booked for Monday at 1210 pm.    Pt verbalize understanding and agrees with plan  Casey Maxfield J. Symphany Fleissner, RN, 04/06/2016, 4:01 PM

## 2016-04-06 NOTE — Telephone Encounter (Signed)
-----   Message from Faith Rogue sent at 04/06/2016  2:15 PM EDT -----  Regarding: ongoing headaches  Contact: [phone]  Samantha Cameron [id number], 60 year old, female    Calls today:  Clinical Questions ([place])    Name of person calling [name]  Specific nature of request ongoing headaches medication prescribed by pcp not helping  Return phone number [phone]  Person calling on behalf of patient: Patient (self)    CALL BACK NUMBER:   Best time to call back:   Cell phone:   Other phone:    Patient's language of care: English    Patient     Patient's PCP: Harrell Gave P. Simons,MD, MD

## 2016-04-09 ENCOUNTER — Ambulatory Visit (HOSPITAL_BASED_OUTPATIENT_CLINIC_OR_DEPARTMENT_OTHER): Payer: MEDICARE | Admitting: Physician Assistant

## 2016-04-09 ENCOUNTER — Encounter (HOSPITAL_BASED_OUTPATIENT_CLINIC_OR_DEPARTMENT_OTHER): Payer: Self-pay | Admitting: Physician Assistant

## 2016-04-09 VITALS — BP 122/76 | HR 100 | Temp 98.4°F | Wt 227.0 lb

## 2016-04-09 DIAGNOSIS — R51 Headache: Principal | ICD-10-CM

## 2016-04-09 DIAGNOSIS — R519 Headache, unspecified: Secondary | ICD-10-CM

## 2016-04-09 MED ORDER — BUTALBITAL-APAP-CAFFEINE 50-325-40 MG PO TABS
ORAL_TABLET | ORAL | 0 refills | Status: AC
Start: 2016-04-09 — End: 2016-05-10

## 2016-04-09 NOTE — Progress Notes (Signed)
Samantha Cameron is a 60 year old female        - As per chart review she was seen by her PCP, Dr. [name], back on 03/26/16 complaining of headache.  She was prescribed Flonase and Claritin.  Today: Reports that she still has pulsating pain on her forehead and back of the head every day.  Pain is constant and is not better since she saw Dr. [name] aback in 03/26/16.  Noise and bright light make pain worse.  (-) nausea, vomiting, slurred sepeach, tingling/numbness, weakness in arms, legs, family h/o migraines, h/o chronic headache, fever, chills, rashes, body aches, confusion, stiff neck.  So far she has this headache since 03/25/16.  When she saw Dr. [name] back on 03/26/06 she was prescribed Flonase and Claritin  Reports she uses both medications every ad prescribed.  Takes Percocet 1 pill twice a day.  Takes 500 mg 2 pills twice a day.  Reports all of the above described medication help with pain only a little bit and she still has very bad headache.  Headache is not getting worse or better.  Has sneezing, but she denies having nasal congestion, watery and/or itchy eyes.  Reports that couple of years ago she had similar headache and Fioricet helps.  Drinks a lot of water.  (+) h/o DM and HTN.      BP 122/76   Pulse 100   Temp 98.4 F (36.9 C) (Temporal)   Wt 103 kg (227 lb)   LMP 10/22/2007   SpO2 97%   BMI 43.14 kg/m2       Physical Exam   Constitutional: She is oriented to person, place, and time. She appears well-developed and well-nourished. No distress.   HENT:   Right Ear: External ear normal.   Left Ear: External ear normal.   Mouth/Throat: Oropharynx is clear and moist. No oropharyngeal exudate.   Eyes: Conjunctivae and EOM are normal. Pupils are equal, round, and reactive to light. Right eye exhibits no discharge. Left eye exhibits no discharge. No scleral icterus.   Neck: No JVD present. No tracheal deviation present. No thyromegaly present.   Musculoskeletal: She exhibits no edema.   Lymphadenopathy:      She has no cervical adenopathy.   Neurological: She is alert and oriented to person, place, and time. No cranial nerve deficit. She exhibits normal muscle tone. Coordination normal.   Strength 5/5 upper and lower extremities b/l  Sensations to touch are grossly intact b/l  Romberg is negative.     Skin: Skin is warm. No rash noted. She is not diaphoretic.   Psychiatric: She has a normal mood and affect. Her behavior is normal. Judgment and thought content normal.   Nursing note and vitals reviewed.      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 I do not suspect her having encephalitis, meningitis or stroke.  Vitals are stable.  I do not think that imaging is necessary at this point.  Plan: -butalbital-acetaminophen-caffeine (FIORICET,         ESGIC) per tablet        Discussed that both Fioricet and Percocet contain tylenol and max dose of tylenol should not exceed 4000 mg daily; dicussed that Ideally I would not want there to take more than 3000 mg daily.  -advised to call the office if headache is not better within a week.  -advised to go to ED if headache gets much worse.

## 2016-04-12 ENCOUNTER — Ambulatory Visit (HOSPITAL_BASED_OUTPATIENT_CLINIC_OR_DEPARTMENT_OTHER): Payer: MEDICARE | Admitting: Physician Assistant

## 2016-04-12 ENCOUNTER — Encounter (HOSPITAL_BASED_OUTPATIENT_CLINIC_OR_DEPARTMENT_OTHER): Payer: Self-pay | Admitting: Physician Assistant

## 2016-04-12 VITALS — BP 106/75 | HR 102 | Wt 229.8 lb

## 2016-04-12 DIAGNOSIS — R519 Headache, unspecified: Secondary | ICD-10-CM

## 2016-04-12 DIAGNOSIS — R51 Headache: Principal | ICD-10-CM

## 2016-04-12 DIAGNOSIS — G930 Cerebral cysts: Secondary | ICD-10-CM

## 2016-04-12 LAB — CBC, PLATELET & DIFFERENTIAL
ABSOLUTE BASO COUNT: 0.1 10*3/uL (ref 0.0–0.1)
ABSOLUTE EOSINOPHIL COUNT: 0.6 10*3/uL (ref 0.0–0.8)
ABSOLUTE IMM GRAN COUNT: 0.03 10*3/uL (ref 0.00–0.03)
ABSOLUTE LYMPH COUNT: 3.2 10*3/uL (ref 0.6–5.9)
ABSOLUTE MONO COUNT: 0.9 10*3/uL (ref 0.2–1.4)
ABSOLUTE NEUTROPHIL COUNT: 6.8 10*3/uL (ref 1.6–8.3)
BASOPHIL %: 0.5 % (ref 0.0–1.2)
EOSINOPHIL %: 5 % (ref 0.0–7.0)
HEMATOCRIT: 42.2 % (ref 34.1–44.9)
HEMOGLOBIN: 13.8 g/dL (ref 11.2–15.7)
IMMATURE GRANULOCYTE %: 0.3 % (ref 0.0–0.4)
LYMPHOCYTE %: 27.5 % (ref 15.0–54.0)
MEAN CORP HGB CONC: 32.7 g/dL (ref 31.0–37.0)
MEAN CORPUSCULAR HGB: 30.7 pg (ref 26.0–34.0)
MEAN CORPUSCULAR VOL: 93.8 fL (ref 80.0–100.0)
MEAN PLATELET VOLUME: 10.9 fL (ref 8.7–12.5)
MONOCYTE %: 7.4 % (ref 4.0–13.0)
NEUTROPHIL %: 59.3 % (ref 40.0–75.0)
PLATELET COUNT: 334 10*3/uL (ref 150–400)
RBC DISTRIBUTION WIDTH STD DEV: 52.5 fL — ABNORMAL HIGH (ref 35.1–46.3)
RBC DISTRIBUTION WIDTH: 15.3 % — ABNORMAL HIGH (ref 11.5–14.3)
RED BLOOD CELL COUNT: 4.5 M/uL (ref 3.90–5.20)
WHITE BLOOD CELL COUNT: 11.5 10*3/uL — ABNORMAL HIGH (ref 4.0–11.0)

## 2016-04-12 LAB — BASIC METABOLIC PANEL
ANION GAP: 8 mmol/L (ref 5–15)
BUN (UREA NITROGEN): 11 mg/dL (ref 7–18)
CALCIUM: 8.9 mg/dL (ref 8.5–10.1)
CARBON DIOXIDE: 25 mmol/L (ref 21–32)
CHLORIDE: 108 mmol/L — ABNORMAL HIGH (ref 98–107)
CREATININE: 0.6 mg/dL (ref 0.4–1.2)
ESTIMATED GLOMERULAR FILT RATE: >60 mL/min (ref >60)
Glucose Random: 96 mg/dL (ref 74–160)
POTASSIUM: 4.5 mmol/L (ref 3.5–5.1)
SODIUM: 141 mmol/L (ref 136–145)

## 2016-04-12 LAB — THYROID SCREEN TSH REFLEX FT4: THYROID SCREEN TSH REFLEX FT4: 1.26 u[IU]/mL (ref 0.358–3.740)

## 2016-04-12 MED ORDER — DIAZEPAM 5 MG PO TABS
ORAL_TABLET | ORAL | 0 refills | Status: AC
Start: 2016-04-12 — End: 2016-05-13

## 2016-04-12 NOTE — Progress Notes (Signed)
Samantha Cameron is a 60 year old female who presents to the office for headache follow up.    Reports that she still has pulsating headache. However, severity of headache is better when she takes Fioricet.   Takes Fioricet 1 pill twice a day.  Reports that not headache is 6/10.  Reports that she continues taking Claritin and using Flonase every day as prescribed by her PCP.  No h/o recurrent headaches prior to starting having this headache 3 weeks ago.          BP 106/75   Pulse 102   Wt 104.2 kg (229 lb 12.8 oz)   LMP 10/22/2007   BMI 43.67 kg/m2     Physical Exam   Constitutional: She appears well-developed and well-nourished. No distress.   Neurological: She is alert.   Skin: She is not diaphoretic.   Psychiatric: She has a normal mood and affect. Her behavior is normal. Judgment and thought content normal.   Nursing note and vitals reviewed.      Assessment/Plan:  (R51) New onset headache  Comment: etiology is not clear. Not better with Claritin and Flonase.  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  Plan: MRI BRAIN W WO CONTRAST

## 2016-04-17 ENCOUNTER — Ambulatory Visit (HOSPITAL_BASED_OUTPATIENT_CLINIC_OR_DEPARTMENT_OTHER): Payer: Self-pay | Admitting: Physician Assistant

## 2016-04-19 ENCOUNTER — Telehealth (HOSPITAL_BASED_OUTPATIENT_CLINIC_OR_DEPARTMENT_OTHER): Payer: Self-pay

## 2016-04-19 NOTE — Progress Notes (Signed)
Tried to call patient, no answer, "not accepting calls at this time" unable to leave message.      Virgel Gess  Arnette Schaumann               Hello Samantha Cameron,   Please call patient and tell her that as per her recent blood work results she does not have anemia; Her kidney function, electrolytes level and thyroid level are within normal limits.       Thank you   Virgel Gess, PA-C

## 2016-04-26 ENCOUNTER — Ambulatory Visit: Payer: Self-pay | Admitting: Physician Assistant

## 2016-04-26 DIAGNOSIS — R51 Headache: Secondary | ICD-10-CM

## 2016-04-26 DIAGNOSIS — G930 Cerebral cysts: Secondary | ICD-10-CM

## 2016-04-26 LAB — MRI BRAIN WO CONTRAST

## 2016-04-26 NOTE — Addendum Note (Signed)
Addended byArdelle Balls on: 04/26/2016 01:41 PM     Modules accepted: Orders

## 2016-04-30 ENCOUNTER — Telehealth (HOSPITAL_BASED_OUTPATIENT_CLINIC_OR_DEPARTMENT_OTHER): Payer: Self-pay

## 2016-04-30 NOTE — Progress Notes (Signed)
Called patient, advised her of message below, she verbalized understanding, she reports headaches are better than they were but she is still getting them. Advised she continue to monitor, advised keeping a journal, and bring this with her to future appt with pcp on 6/29, also advised to call for a sooner appt if she develops any new or worsening sxs, she agreed.    [place],   Please call patient and tell her that her MRI of brain does not show any masses or bleeding/infarc.     Thank you   Virgel Gess, PA-C

## 2016-04-30 NOTE — Progress Notes (Signed)
Called patient, advised her of message below, she verbalized understanding.

## 2016-05-17 ENCOUNTER — Telehealth (HOSPITAL_BASED_OUTPATIENT_CLINIC_OR_DEPARTMENT_OTHER): Payer: Self-pay

## 2016-05-17 ENCOUNTER — Encounter (HOSPITAL_BASED_OUTPATIENT_CLINIC_OR_DEPARTMENT_OTHER): Payer: Self-pay | Admitting: Internal Medicine

## 2016-05-17 ENCOUNTER — Ambulatory Visit (HOSPITAL_BASED_OUTPATIENT_CLINIC_OR_DEPARTMENT_OTHER): Payer: MEDICARE | Admitting: Internal Medicine

## 2016-05-17 VITALS — BP 104/56 | HR 120 | Temp 97.6°F | Wt 223.0 lb

## 2016-05-17 DIAGNOSIS — R809 Proteinuria, unspecified: Secondary | ICD-10-CM

## 2016-05-17 DIAGNOSIS — F17200 Nicotine dependence, unspecified, uncomplicated: Secondary | ICD-10-CM

## 2016-05-17 DIAGNOSIS — Z23 Encounter for immunization: Secondary | ICD-10-CM

## 2016-05-17 DIAGNOSIS — E1129 Type 2 diabetes mellitus with other diabetic kidney complication: Secondary | ICD-10-CM

## 2016-05-17 LAB — BLOOD SUGAR FINGERSTICK (POINT OF CARE): FINGERSTICK GLUCOSE: 108 mg/dl (ref 74–160)

## 2016-05-17 NOTE — Progress Notes (Signed)
VIS given prior to administration and reviewed with the patient and or legal guardian. Patient understands the disease and the vaccine. See immunization/Injection module or chart review for date of publication and additional information.    Samantha Cameron ,LPN

## 2016-05-17 NOTE — Progress Notes (Signed)
Treater Name: [place]  Tracking #: [id number]    Member Name: Madine Woodrum  Status: Received    Request Type: New Form  Created: 05/17/2016 2:24:00 PM       Facility Location:  Member Pickup Address:    [address]   [address] [place], [place] [address]  [place], [place] [address]       Requested Service:  - Eye Care   Explanation:  -   Frequency of Services:  - 1 per Week   Wheelchair van needed: - No   Escort accompanying the member: - No   Service Animal needed: - No

## 2016-05-17 NOTE — Progress Notes (Signed)
Samantha Cameron is a 60 year old female here for follow up diabetes    Patient Active Problem List:     Left genital labial abscess     Tobacco use disorder     Spinal stenosis of lumbar region     HLD (hyperlipidemia)     Chronic bilateral low back pain without sciatica     Skin change     Tachycardia     Pineal gland cyst     History of vitamin D deficiency     Hemorrhoids     Venous (peripheral) insufficiency     Adrenal adenoma     Type 2 diabetes mellitus with microalbuminuria, without long-term current use of insulin ([place])     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 (HCC)     Acute nonintractable headache     Cough     Right ear impacted cerumen     Acute pain of right wrist     Abnormal PFTs     Screening for malignant neoplasm of breast     Skin irritation     H/O bone density study    Random finger stick in clinic today was 108   This morning, number was 91   Morning numbers in the last 2 weeks have been around 106-108, highest was 118, lowest was 85  No shaking, sweating, dizziness  Last saw dentist in 03/2016 or 04/2016  Must reschedule eye doctor, was last there 1 year ago, due 06/01/2016  Currently takes Metformin 4 pills altogether at night   Takes lisinopril for kidneys  Has gotten 2 of 3 Hep B shots    HEMOGLOBIN A1C (%)   Date Value   01/24/2016 6.1 (H)   07/15/2015 6.2 (H)     Currently smoking 7 cigarettes per day, has been cutting back    Most Recent BP Reading(s)  05/17/16 : 104/56  04/12/16 : 106/75  04/09/16 : 122/76  03/26/16 : 126/58  01/24/16 : 90/57    Has been walking and watching what she eats   Most Recent Weight Reading(s)  05/17/16 : 101.2 kg (223 lb)  04/12/16 : 104.2 kg (229 lb 12.8 oz)  04/09/16 : 103 kg (227 lb)  03/26/16 : 103.4 kg (228 lb)  12/12/15 : 104.8 kg (231 lb)    PE:  BP 104/56   Pulse 120   Temp 97.6 F (36.4 C) (Temporal)   Wt 101.2 kg (223 lb)   LMP 10/22/2007   SpO2 97%   BMI 42.38 kg/m2  Estimated body mass index is 42.38 kg/(m^2) as  calculated from the following:    Height as of 08/15/15: 5' 0.83" (1.545 m).    Weight as of this encounter: 101.2 kg (223 lb).  Gen - Morbidly obese female upright in chair in NAD  HEENT - MMM  CV - RRR, no m/r/g  Resp - CTAB  Abd - +BS, soft, NT/ND  Neuro - A+Ox3  Psych - Appropriate  Feet - sensation intact by monofilament b/l, feet clean, dry, and intact     A/P:  (E11.29,  R80.9) Type 2 diabetes mellitus with microalbuminuria, without long-term current use of insulin (HCC)  (primary encounter diagnosis)  Comment: Controlled by last a1c, fingersticks, continue current regimen  Plan: BLOOD SUGAR FINGERSTICK (POINT OF CARE),         REFERRAL TO OPHTHALMOLOGY ( INT)  Will go to [facility] ophthalmologist on [address]  Defer next a1c check to next  visit in 3 months   RTC in 3 months for follow up diabetes with Dr. [name]    (E66.01) 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 ([place])  Comment: By BMI  Plan: Discussed portion control, healthy food choice, exercise    (Z23) Need for prophylactic vaccination and inoculation against viral hepatitis  Comment: Accepts #3 today  Plan: IMMUNIZATION ADMIN SINGLE, RN, PR HEPB VACCINE         ADULT 3 DOSE SCHEDULE FOR IM USE     (F17.200) Tobacco use disorder  Comment: Contemplative, cutting down  Plan: Encouraged to continue to decrease cigarette smoking    By signing my name below, I, Jaimie Rogner, attest that this documentation has been prepared under the direction and in the presence of [place]. [name], M.D.  Electronically Signed: Corrie Mckusick, Medical Scribe. 05/17/16 1:49 PM    I, Osa Craver, personally performed the services described in this documentation. All medical record entries made by the scribe were at my direction and in my presence. I have reviewed the chart and discharge instructions (if applicable) and agree that the record reflects my personal performance and is accurate and complete.   Lilit Cinelli P. Jacaden Forbush,MD, 05/17/2016, 2:47  PM

## 2016-05-25 ENCOUNTER — Other Ambulatory Visit (HOSPITAL_BASED_OUTPATIENT_CLINIC_OR_DEPARTMENT_OTHER): Payer: Self-pay | Admitting: Physician Assistant

## 2016-05-25 MED ORDER — LISINOPRIL 20 MG PO TABS
20.0000 mg | ORAL_TABLET | Freq: Every day | ORAL | 3 refills | Status: DC
Start: 2016-05-25 — End: 2017-02-21

## 2016-05-25 NOTE — Telephone Encounter (Signed)
-----  Message from Silverio Decamp sent at 05/25/2016 11:39 AM EDT -----  Regarding: refill  [facility]    Person calling on behalf of patient: Pharmacy    May list multiple medications in this section    Medicine Name: Lisinopril 20mg   90 day supply      Documented patient preferred pharmacies:   [facility] - [place], [place]  Phone: [phone] Fax: [phone]

## 2016-05-25 NOTE — Progress Notes (Signed)
PER Patient (self), Samantha Cameron is a 60 year old female has requested a refill of Lisinopril 20mg .  Patient requesting 90 day supply.    Last Office Visit: 05/17/16  Last Physical Exam: 08/15/15      HTN Med:    Most Recent BP Reading(s)  05/17/16 : 104/56  04/12/16 : 106/75  04/09/16 : 122/76    Other Med Adult:  Most Recent BP Reading(s)  05/17/16 : 104/56          Cholesterol (mg/dL)   Date Value   07/15/2015 162   ----------    LOW DENSITY LIPOPROTEIN DIRECT (mg/dL)   Date Value   07/15/2015 75   ----------    HIGH DENSITY LIPOPROTEIN (mg/dL)   Date Value   07/15/2015 49   ----------    TRIGLYCERIDES (mg/dl)   Date Value   02/08/2009 189 (H)   ----------        THYROID SCREEN TSH REFLEX FT4 (uIU/mL)   Date Value   04/12/2016 1.260   ----------      No results found for: TSH      HEMOGLOBIN A1C (%)   Date Value   01/24/2016 6.1 (H)   ----------        INR (no units)   Date Value   02/01/2012 < 1.0 (L)   02/07/2009 1.0 (L)   06/16/2007 1.0 (L)   ----------      SODIUM (mmol/L)   Date Value   04/12/2016 141   ----------      POTASSIUM (mmol/L)   Date Value   04/12/2016 4.5   ----------          CREATININE (mg/dL)   Date Value   04/12/2016 0.6   ----------    Documented patient preferred pharmacies:    [facility] - [place], [place]  Phone: [phone] Fax: [phone]

## 2016-05-27 ENCOUNTER — Inpatient Hospital Stay (HOSPITAL_BASED_OUTPATIENT_CLINIC_OR_DEPARTMENT_OTHER)
Admission: RE | Admit: 2016-05-27 | Disposition: A | Discharge status: Home / Self Care | Payer: Self-pay | Source: Emergency Department | Attending: Internal Medicine | Admitting: Internal Medicine

## 2016-05-27 ENCOUNTER — Telehealth (HOSPITAL_BASED_OUTPATIENT_CLINIC_OR_DEPARTMENT_OTHER): Payer: Self-pay | Admitting: Internal Medicine

## 2016-05-27 ENCOUNTER — Encounter (HOSPITAL_BASED_OUTPATIENT_CLINIC_OR_DEPARTMENT_OTHER): Payer: Self-pay

## 2016-05-27 LAB — CBC, PLATELET & DIFFERENTIAL
ABSOLUTE BASO COUNT: 0 10*3/uL (ref 0.0–0.1)
ABSOLUTE EOSINOPHIL COUNT: 0.5 10*3/uL (ref 0.0–0.8)
ABSOLUTE IMM GRAN COUNT: 0.02 10*3/uL (ref 0.00–0.03)
ABSOLUTE LYMPH COUNT: 3.1 10*3/uL (ref 0.6–5.9)
ABSOLUTE MONO COUNT: 0.8 10*3/uL (ref 0.2–1.4)
ABSOLUTE NEUTROPHIL COUNT: 6.3 10*3/uL (ref 1.6–8.3)
BASOPHIL %: 0.3 % (ref 0.0–1.2)
EOSINOPHIL %: 4.2 % (ref 0.0–7.0)
HEMATOCRIT: 40.9 % (ref 34.1–44.9)
HEMOGLOBIN: 13.5 g/dL (ref 11.2–15.7)
IMMATURE GRANULOCYTE %: 0.2 % (ref 0.0–0.4)
LYMPHOCYTE %: 29.2 % (ref 15.0–54.0)
MEAN CORP HGB CONC: 33 g/dL (ref 31.0–37.0)
MEAN CORPUSCULAR HGB: 30.3 pg (ref 26.0–34.0)
MEAN CORPUSCULAR VOL: 91.9 fL (ref 80.0–100.0)
MEAN PLATELET VOLUME: 10.4 fL (ref 8.7–12.5)
MONOCYTE %: 7.2 % (ref 4.0–13.0)
NEUTROPHIL %: 58.9 % (ref 40.0–75.0)
PLATELET COUNT: 296 10*3/uL (ref 150–400)
RBC DISTRIBUTION WIDTH STD DEV: 47 fL — ABNORMAL HIGH (ref 35.1–46.3)
RBC DISTRIBUTION WIDTH: 14.2 % (ref 11.5–14.3)
RED BLOOD CELL COUNT: 4.45 M/uL (ref 3.90–5.20)
WHITE BLOOD CELL COUNT: 10.7 10*3/uL (ref 4.0–11.0)

## 2016-05-27 LAB — URINALYSIS
BILIRUBIN, URINE: NEGATIVE
GLUCOSE, URINE: NEGATIVE MG/DL
KETONE, URINE: NEGATIVE MG/DL
LEUKOCYTE ESTERASE: NEGATIVE
NITRITE, URINE: NEGATIVE
OCCULT BLOOD, URINE: NEGATIVE
PH URINE: 5 (ref 5.0–8.0)
PROTEIN, URINE: NEGATIVE MG/DL
SPECIFIC GRAVITY URINE: 1.025 (ref 1.003–1.035)

## 2016-05-27 LAB — BLOOD SUGAR FINGERSTICK (POINT OF CARE)
FINGERSTICK GLUCOSE: 145 mg/dl (ref 74–160)
FINGERSTICK GLUCOSE: 108 mg/dl (ref 74–160)

## 2016-05-27 LAB — PROTHROMBIN TIME
INR: 1 (ref 2.0–3.5)
PROTHROMBIN TIME: 11.5 SECONDS (ref 9.6–12.3)

## 2016-05-27 LAB — CT HEAD WO CONTRAST STROKE PROTOCOL

## 2016-05-27 LAB — HOLD GREEN TOP TUBE

## 2016-05-27 LAB — XR CHEST 2 VIEWS

## 2016-05-27 MED ORDER — FLUTICASONE PROPIONATE 50 MCG/ACT NA SUSP
1.0000 | Freq: Every day | NASAL | Status: DC
Start: 2016-05-27 — End: 2016-05-28
  Filled 2016-05-27: qty 16

## 2016-05-27 MED ORDER — METFORMIN HCL ER 500 MG PO TB24
2000.0000 mg | ORAL_TABLET | Freq: Every day | ORAL | Status: DC
Start: 2016-05-28 — End: 2016-05-27

## 2016-05-27 MED ORDER — PRAVASTATIN SODIUM 40 MG PO TABS
80.0000 mg | ORAL_TABLET | Freq: Every day | ORAL | Status: DC
Start: 2016-05-27 — End: 2016-05-28
  Administered 2016-05-27: 80 mg via ORAL
  Filled 2016-05-27: qty 2

## 2016-05-27 MED ORDER — ASPIRIN EC 81 MG PO TBEC
81.0000 mg | DELAYED_RELEASE_TABLET | Freq: Every day | ORAL | Status: DC
Start: 2016-05-27 — End: 2016-05-28
  Administered 2016-05-27 – 2016-05-28 (×2): 81 mg via ORAL
  Filled 2016-05-27 (×2): qty 1

## 2016-05-27 MED ORDER — OXYCODONE-ACETAMINOPHEN 5-325 MG PO TABS
1.0000 | ORAL_TABLET | Freq: Two times a day (BID) | ORAL | Status: DC | PRN
Start: 2016-05-27 — End: 2016-05-28
  Administered 2016-05-27: 1 via ORAL
  Filled 2016-05-27: qty 1

## 2016-05-27 MED ORDER — METFORMIN HCL 500 MG PO TABS
1000.0000 mg | ORAL_TABLET | Freq: Two times a day (BID) | ORAL | Status: DC
Start: 2016-05-27 — End: 2016-05-28
  Administered 2016-05-27 – 2016-05-28 (×2): 1000 mg via ORAL
  Filled 2016-05-27 (×2): qty 2

## 2016-05-27 MED ORDER — HEPARIN SODIUM (PORCINE) 5000 UNIT/ML IJ SOLN
5000.0000 [IU] | Freq: Two times a day (BID) | INTRAMUSCULAR | Status: DC
Start: 2016-05-27 — End: 2016-05-28
  Administered 2016-05-27 – 2016-05-28 (×2): 5000 [IU] via SUBCUTANEOUS
  Filled 2016-05-27 (×2): qty 1

## 2016-05-27 MED ORDER — ALBUTEROL SULFATE HFA 108 (90 BASE) MCG/ACT IN AERS
2.0000 | INHALATION_SPRAY | Freq: Four times a day (QID) | RESPIRATORY_TRACT | Status: DC | PRN
Start: 2016-05-27 — End: 2016-05-28
  Filled 2016-05-27: qty 8

## 2016-05-27 MED ORDER — NICOTINE 14 MG/24HR TD PT24
1.0000 | MEDICATED_PATCH | Freq: Every day | TRANSDERMAL | Status: DC
Start: 2016-05-27 — End: 2016-05-28
  Administered 2016-05-27 – 2016-05-28 (×2): 1 via TRANSDERMAL
  Filled 2016-05-27 (×2): qty 1

## 2016-05-27 MED ORDER — PREGABALIN 100 MG PO CAPS
100.0000 mg | ORAL_CAPSULE | Freq: Two times a day (BID) | ORAL | Status: DC
Start: 2016-05-27 — End: 2016-05-28
  Administered 2016-05-27 – 2016-05-28 (×2): 100 mg via ORAL
  Filled 2016-05-27 (×2): qty 1

## 2016-05-27 MED ORDER — ASPIRIN 81 MG PO CHEW
162.00 mg | CHEWABLE_TABLET | Freq: Once | ORAL | Status: AC
Start: 2016-05-27 — End: 2016-05-27
  Administered 2016-05-27: 162 mg via ORAL
  Filled 2016-05-27: qty 2

## 2016-05-27 MED ORDER — SODIUM CHLORIDE 0.9 % IV BOLUS
1000.0000 mL | Freq: Once | INTRAVENOUS | Status: AC
  Administered 2016-05-27: 1000 mL via INTRAVENOUS

## 2016-05-27 NOTE — Narrator Note (Signed)
Assumed care of pt

## 2016-05-27 NOTE — Narrator Note (Signed)
Hospitalist at bedside, aware of hypotension.

## 2016-05-27 NOTE — Narrator Note (Signed)
Report to Christine RN

## 2016-05-27 NOTE — Narrator Note (Signed)
MD at bedside. 

## 2016-05-27 NOTE — H&P (Addendum)
H&P NOTE     Chief Complaint:   Patient presents with:  Limb Weakness: NUMBNESS      History of Present Illness:  Samantha Cameron is a 60 year old female who presents with  right arm weakness and numbness. Patient states that she went to bed last night in her usual state of good health. Woke up at 2 AM sleeping on her right side, but did not have any symptoms. She rolled over on her back and slept the rest of the night on her back. Woke up at 835 AM with a dense paresis and paresthesias of the right upper extremity. States that she had to use her left arm to shake out the right arm to try to regain strength and feeling. Still feels as though she is"carrying a 5 pound weight"in the right arm.  Denies any other weakness or paresthesias in her extremities or face. No speech or gait abnormality. Denies any headache, chest pain, palpitations, dyspnea. Onset subacute.    Pt smokes half pack a day. Denies drug use.    Family h/o of stroke(mom)    She lives in [place] with her sone    Review of Systems:  As HPI     Patient Active Problem List:  Patient Active Problem List:     Left genital labial abscess     Tobacco use disorder     Spinal stenosis of lumbar region     HLD (hyperlipidemia)     Chronic bilateral low back pain without sciatica     Skin change     Tachycardia     Pineal gland cyst     History of vitamin D deficiency     Hemorrhoids     Venous (peripheral) insufficiency     Adrenal adenoma     Type 2 diabetes mellitus with microalbuminuria, without long-term current use of insulin ([place])     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 (HCC)     Acute nonintractable headache     Cough     Right ear impacted cerumen     Acute pain of right wrist     Abnormal PFTs     Screening for malignant neoplasm of breast     Skin irritation     H/O bone density study      Past Medical History:   Past Medical History:  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 right                tricompartment arthritis esp medial femoral                tibial  No date: Cerumen impaction      Comment: per steward records  02/01/2012: Chest pain  No date: Chronic bronchitis  02/01/2012: Chronic bronchitis with COPD (chronic obstruct*  No date: Depression      Comment: per steward records  No date: Diabetes  No date: Disorders of lipoid metabolism  No date: Esophageal reflux  No date: External hemorrhoid  Comment: per steward records  No date: HTN (hypertension)  02/01/2012: Hypoxia  06/16/2015: Obesity, Class III, 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*  08/16/2015: Osteopenia      Comment: On xray of left knee per steward records                06/2014 Unclear if addressed  08/15/2015: Type 2 diabetes mellitus without complication,*      Comment:  HEMOGLOBIN A1C (%) Date Value 07/15/2015 6.2                (H) ----------  Steward record - a1c 6.2 on                01/31/15 6.4 on 09/30/14 5.9 on 12/29/13 6.4                08/18/13     Past Surgical History:   Past Surgical History:  No date: ANES NERVE MUSC TENDON FASCIA&BURSA KNEE&/POPLT  No date: FOOT SURGERY      Comment: bilateral  No date: LAPAROSCOPY SURG CHOLECYSTECTOMY  No date: OB ANTEPARTUM CARE CESAREAN DLVR & POSTPARTUM      Comment: x3  No date: TONSILLECTOMY & ADENOIDECTOMY <AGE 45  04/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: TOTAL KNEE REPLACEMENT      Comment: left  08/11/2009: WRIST GANGLION EXCISION      Comment: left     Medications prior to Admission:     (Not in a hospital admission)    Allergies:   Review of Patient's Allergies indicates:   Codeine camsylate       Shortness of Breath, Other (See                            Comments)    Comment:Chest pain   Motrin [ibuprofen]      Nausea Only   Augmentin [amoxicil*    Nausea and Vomiting   Bactrim                  Rash, Itching    Social History:  Tobacco Use:   Smoking status: Current Every Day Smoker 0.50 Packs/day For 31.00 Years     Alcohol:   Alcohol use No       Family History:   Family History    Diabetes Mother     Heart Brother     Comment: CAD and AAA    Hypertension Mother     Arthritis Mother     Stroke Mother     Diabetes Brother     Comment: same brother    Cancer - Other Father     Comment: type uncertain    Thyroid Sister     Alcohol/Drug Abuse Son     OTHER Son     Comment: ?brain aneurysm per steward records    No Known Problems Grandchild     Comment: 6    Cancer - Breast FamHxNeg     Cancer - Cervical FamHxNeg     Cancer - Ovarian FamHxNeg     Cancer - Colon FamHxNeg     Psychiatric Illness FamHxNeg        Physical Exam:    Intake/Output last 24hours (7a-7a):        Vital Signs - Last 24 Hours:   BP: (122)/(78)   Temp:  [98.2 F (36.8 C)]   Pulse:  [96-118]   Resp:  [18]   SpO2:  [97 %]     Vital Signs - Last 8  Hours:   BP: (122)/(78)   Temp:  [98.2 F (36.8 C)]   Pulse:  [96-118]   Resp:  [18]   SpO2:  [97 %]   Constitutional: No painful distress, Non-toxic appearance.   HENT:  Normocephalic, Atraumatic, Bilateral external ears normal, Oropharynx moist.  Neck: Normal range of motion. No tenderness, Supple, No stridor, No JVD.  Eyes: PERRL, EOMI, Conjunctiva normal, No discharge.   Respiratory:  No respiratory distress, No wheezing, No chest tenderness.   Cardiovascular: Normal heart rate, regular rhythm, No murmurs.   Abdominal: Soft, No tenderness, No masses, No pulsatile masses.   Musculoskeletal: No edema, No tenderness, No cyanosis, No clubbing. No deformities noted.   Skin: Warm, Dry, No erythema, No rash.   Neurologic: Alert & oriented x 3, CN 2-12 normal, normal motor function and normal sensory function except for right upper extremity: Biceps, triceps, grip strength, finger abduction and abduction 4 out of 5, diminished sensation to light touch globally through the arm, forearm and hand        Recent Labs:  CBC:   Lab Results  Component Value Date   WBC 10.7 05/27/2016   HGB 13.5 05/27/2016   HCT 40.9 05/27/2016   PLTA 296 05/27/2016   RBC 4.45 05/27/2016     BMP:   Lab Results  Component Value Date   NA 139 05/27/2016   K 4.5 05/27/2016   CL 103 05/27/2016   CO2 25 05/27/2016   BUN 17 05/27/2016   CREAT 0.8 05/27/2016   GLUCOSER 108 05/27/2016   CA 9.4 05/27/2016     LFTs:   Lab Results  Component Value Date   AST 16 07/15/2015   ALT 33 07/15/2015   TBILI 0.3 07/15/2015   ALKPHOS 104 07/15/2015         Lab Results  Component Value Date   PT 11.5 05/27/2016   INR 1.0 05/27/2016   APTT 27.7 02/01/2012       Lab Results  Component Value Date   CKMBI 1.6 02/08/2009   TROPI 0.01 11/20/2012     No results found for: BNP      Imaging:   Computed tomography scan of head  Chest x-ray no acute disease as read by me as read by me    Sinus tachycardia, no acute ischemic changes, normal intervals    Assessment/Plan:  [name] right-hand-dominant 60 year old female with a history of diabetes, hypertension, dyslipidemia, chronic lower back pain presents with RUL weakness admitted for CVA work up.    # R arm weakness:?CVA vs TIA  No other neurologic complaints or deficits on exam except for the right upper extremity weakness and paresthesias. Pt risk factor factors include age, smoking,  HTN, HLD, +FH for stroke. EKG and labs reassuring.  Acute Intracranial hemorrhage ruled out with CT head. Internal capsul infarct need to be r/u. Other DD include musculoskeletal vs nerve palsy   -MRI brain w/wo contrast and MRA head and neck.  -Echo  -c/w ASA, Statin  -neuro consult   -neuro check q4h  -vitals check q 4hr   -keep head of the bed flat   -monitor BP, consider IVF if hypotensive, SBP goal>110  -tele     #Nicotine dependance  -patch   -smoking cessation education       Chronic issues  #HTN: hold Lisinopril, metoprolol 12.5mg  for permissive HTN   #HLD: c/w atorvastatin   #DM: Metformin 2000 q day  #chronic back  pain: Lyrica 100 mg, oxycodone q12  prn  #chronic cough: albuterol prn, Flonase prn     FEN/GI: normal K, NA  DVT PPx - SQHeparin  Diet: AHA, DM  Pharmacy:  [facility], [address]  Med rec: Done   Contact: sone Eik [phone]  PCP: Osa Craver   Code: Full  Dispo: pending for CVA work up     Discussed with Dr. Patsy Baltimore, MD  PGY2  Pager [phone]

## 2016-05-27 NOTE — Plan of Care (Signed)
Problem: Pain  Goal: Patients pain/discomfort is manageable  Assess and monitor patients pain using appropriate pain scale. Collaborate with interdisciplinary team and initiate plan and interventions as ordered. Re-assess patients pain level 30 - 60 minutes after pain management intervention.   Intervention: Assess patients pain level  Assess character, frequency and duration of headache 7/10.  Oral oxycodone given with relief pending.

## 2016-05-27 NOTE — Narrator Note (Signed)
Pt to bathroom to provide urine sample, gait steady.

## 2016-05-27 NOTE — Narrator Note (Signed)
Admission process initiated.

## 2016-05-27 NOTE — Narrator Note (Signed)
Report to Scott, RN.

## 2016-05-27 NOTE — RN Shift Note (Addendum)
S/P Right upper extremity weakness, diabetes    Pt reported having a headache today.  Relieved with percocet.  She denies having any constitutional symptoms at this time.    Afebrile with VSS.  Calm and cooperative.  On tele- monitoring 2298 NSR.  Labs; unremarkable.  No weakness noted on right extremity.   Neuro checks every 4 hrs.     A/P;  Monitor vital signs, assess pain levels, echo, MRI for head and neck tomorrow.

## 2016-05-27 NOTE — Discharge Summary (Addendum)
Physician Discharge Summary     Patient ID:  Samantha Cameron  [id number]  60 year old  05-13-1956    Admit date: 05/27/2016  2:41 PM    Discharge date and time: 05/28/16     Admitting Physician: Mickey Farber     Discharge Physician: Maudry Diego    Discharge Diagnoses:   (R29.898) Other symptoms and signs involving the musculoskeletal system  (primary encounter diagnosis)  (Z86.39) Personal history of other endocrine, nutritional and metabolic disease  (A999333) Tobacco use disorder      Admission Condition: fair    Discharged Condition: good    Indication for Admission: weakness, CVA r/o  PCP to Follow up:  -Echo read pending on discharge  -OT recommended stretches to continue at home, re-eval radial nerve palsy symptoms    Hospital Course:   Samantha Cameron right-hand-dominant 60 year old female with a history of diabetes, hypertension, dyslipidemia, chronic lower back pain presented  with right upper extremity weakness. She woke up abruptly at 2am after sleeping on her right side, did not have symptoms at that time. In the morning noted a dense paresis and paresthesias of the right upper extremity. She was admitted with concerns for acute CVA, workup was negative and her symptoms improved, attributed to possible radial nerve injury.     # Right arm weakness:?CVA vs TIA: Concern on admission for acute CVA vs ICH vs internal capsule infarct, CT head negative. MRI brain and MRA head and neck showed no acute abnormalities. Electrolytes and CBC were within normal limits. She was monitored on telemetry with no events. TTE was pending at time of discharge. Occupational therapy was consulted and recommended home stretches. She was monitored on telemetry with no events, neuro checks and continued on aspirin and statin.     # Nicotine dependence: Smoking cessation education was given and she was given patch while inpatient, deferred on discharge.    Consults: occupational therapy    Significant Diagnostic Studies:     MRI  Brain w/o contrast 05/28/16  No acute or subacute abnormality of the brain by MRI.    Mild mastoid air cell disease and sinus disease, unchanged.    Mild white matter disease which is nonspecific remains unchanged in    remains likely due to chronic small vessel ischemic change.    MRA Head and Neck w/o contrast 05/28/16  Unremarkable MRA of the carotid arterial system. However, patient    motion degrades image quality. Subtle stenoses may not be detected.    Unremarkable MRA of the brain.      CXR 05/27/16  Impression: No active pulmonary infiltrates.     Noncontrast CT Head 05/27/16  Impression: There are changes consistent with small vessel ischemic    disease, but no apparent acute intracranial abnormality. The results    were telephoned to Dr. Levada Dy at 12:25 PM 05/27/2016.        Recent Labs   05/27/16  1208 05/28/16  0700   NA 139 141   K 4.5 4.2   CL 103 105   CO2 25 26   BUN 17 14   CREAT 0.8 0.7   GFR > 60 > 60   CA 9.4 9.0   ANION 11 10   GLUCOSER 108 101   WBC 10.7  --    HGB 13.5  --    HCT 40.9  --    PLTA 296  --    MG  --  1.3*   PHOS  --  4.1   INR 1.0  --        Discharge Exam:  BP 116/76   Pulse 99   Temp 97.2 F (36.2 C) (Temporal)   Resp 18   LMP 10/22/2007   SpO2 97%  Pain Score: 7 (7/10)    Constitutional: Alert oriented, no acute distress   HENT:  Normocephalic, Atraumatic, Oropharynx moist.  Neck: Normal range of motion. No tenderness, Supple, No stridor, No JVD.  Eyes: PERRL, EOMI, Conjunctiva normal, No discharge.   Respiratory:  No respiratory distress, No wheezing, No chest tenderness.   Cardiovascular: Normal heart rate, regular rhythm, No murmurs.   Abdominal: Soft, No tenderness, No masses, No pulsatile masses.   Musculoskeletal: No edema, No tenderness, No cyanosis, No clubbing. No deformities noted.   Skin: Warm, Dry, No erythema, No rash.   Neurologic: Alert & oriented x 3, CN II-XII normal, motor function intact, biceps, triceps, grip strength, finger abduction and  abduction 4 out of 5, sensation intact to light touch globally through the arm, forearm and hand.    Disposition: discharged to home in stable condition    Patient Instructions:   Current Discharge Medication List    CONTINUE these medications which have NOT CHANGED    lisinopril (PRINIVIL,ZESTRIL) 20 MG tablet  Take 1 tablet by mouth daily  Qty: 90 tablet Refills: 3    metFORMIN (GLUCOPHAGE-XR) 500 MG 24 hr tablet  Take 4 tablets by mouth daily with breakfast  Qty: 120 tablet Refills: 11  Associated Diagnoses:Type 2 diabetes mellitus with microalbuminuria, without long-term current use of insulin (HCC)    atorvastatin (LIPITOR) 20 MG tablet  Take 1 tablet by mouth daily  Qty: 90 tablet Refills: 3    metoprolol (TOPROL-XL) 25 MG 24 hr tablet  Take 0.5 tablets by mouth daily At bedtime- half pill  Qty: 15 tablet Refills: 12  Associated Diagnoses:Tachycardia    pregabalin (LYRICA) 100 MG capsule  Take 100 mg by mouth 2 (two) times daily With Dr Dwyane Dee    aspirin 81 MG tablet  Take 81 mg by mouth daily.    Lancets  Use 1 time daily.  Dispense lancets that are covered by insurance. 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    oxycodone-acetaminophen (PERCOCET) 5-325 MG per tablet  Take 1 tablet by mouth 2 (two) times daily as needed With Dr Dwyane Dee        Activity: activity as tolerated  Diet: cardiac diet  Wound Care: none needed    [place] Future Appointments:  Current and Future Appointments at [facility] (90 Days)  06/04/2016 11:50 AM FOLLOW-UP (20 min.) [facility] [id number] Beaulah Dinning    08/20/2016  1:30 PM DM NEW 30 (30 min.) [facility] [id number] Charise Carwin          Follow-up with:  Follow-up Information     Follow up With Details Comments Contact Info    Christopher P. [name], MD In 1 week @ [address] [place] [address]  [phone]  fax #: [phone], DO   [address], [place] [address]  [phone]  fax #: [phone]            Code Status during hospital stay: Full Code      Signed:  Wilhelmenia Blase, MD, 05/28/2016, 5:21 PM

## 2016-05-27 NOTE — ED Provider Notes (Signed)
eMERGENCY dEPARTMENT eNCOUnter      The mode of arrival was Self on 05/27/2016 11:35 AM.    CHIEF COMPLAINT    Patient presents with:  Limb Weakness: NUMBNESS      HPI    Samantha Cameron is a 60 year old female who presents on 05/27/2016 11:35 AM for evaluation of right arm weakness and numbness.  Patient states that she went to bed last night in her usual state of good health.  Woke up at 2 AM sleeping on her right side, but did not have any symptoms.  She rolled over on her back and slept the rest of the night on her back.  Woke up at 835 AM with a dense paresis and paresthesias of the right upper extremity.  States that she had to use her left arm to shake out the right arm to try to regain strength and feeling.  Still feels as though she is"carrying a 5 pound weight"in the right arm.  Denies any other weakness or paresthesias in her extremities or face.  No speech or gait abnormality.  Denies any headache, chest pain, palpitations, dyspnea.  Onset subacute.    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*  08/16/2015: Osteopenia      Comment:  On xray of left knee per steward records                06/2014 Unclear if addressed  08/15/2015: Type 2 diabetes mellitus without complication,*      Comment:  HEMOGLOBIN A1C (%) Date Value 07/15/2015 6.2                (H) ----------  Steward record - a1c 6.2 on                01/31/15 6.4 on 09/30/14 5.9 on 12/29/13 6.4                08/18/13     SURGICAL HISTORY    Past Surgical History:  No date: ANES NERVE MUSC TENDON FASCIA&BURSA KNEE&/POPLT  No date: FOOT SURGERY      Comment: bilateral  No date: LAPAROSCOPY SURG CHOLECYSTECTOMY  No date: OB ANTEPARTUM CARE CESAREAN DLVR & POSTPARTUM  Comment: x3  No date: TONSILLECTOMY & ADENOIDECTOMY <AGE 89  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     Current Outpatient Prescriptions:     lisinopril (PRINIVIL,ZESTRIL) 20 MG tablet, Take 1 tablet by mouth daily, Disp: 90 tablet, Rfl: 3    metFORMIN (GLUCOPHAGE-XR) 500 MG 24 hr tablet, Take 4 tablets by mouth daily with breakfast, Disp: 120 tablet, Rfl: 11    atorvastatin (LIPITOR) 20 MG tablet, Take 1 tablet by mouth daily, Disp: 90 tablet, Rfl: 3    metoprolol (TOPROL-XL) 25 MG 24 hr tablet, Take 0.5 tablets by mouth daily At bedtime- half pill, Disp: 15 tablet, Rfl: 12    pregabalin (LYRICA) 100 MG capsule, Take 100 mg by mouth 2 (two) times daily With Dr Dwyane Dee, Disp: , Rfl:     aspirin 81 MG tablet, Take 81 mg by mouth daily., Disp: , Rfl:     Lancets, Use 1 time daily.  Dispense lancets that are covered by insurance. Dx e11.29, Disp: 100 each, Rfl: 11    albuterol (PROVENTIL HFA,VENTOLIN HFA, PROAIR HFA) 108 (90 BASE) MCG/ACT inhaler, Inhale 2 puffs into the lungs every 6 (six) hours as needed for Wheezing or Shortness of breath, Disp: 1 Inhaler, Rfl: 11    Omega-3 Fatty Acids (FISH OIL) 1000 MG CAPS, Take by mouth, Disp: , Rfl:     cholecalciferol  (VITAMIN D3) 1000 UNIT tablet, Take 1 tablet by mouth daily, Disp: , Rfl:     glucose blood (ONETOUCH VERIO) test strip, Use as instructed, Disp: 100 strip, Rfl: 11    fluticasone (FLONASE) 50 MCG/ACT nasal spray, 1 spray by Each Nostril route daily., Disp: , Rfl:     oxycodone-acetaminophen (PERCOCET) 5-325 MG per tablet, Take 1 tablet by mouth 2 (two) times daily as needed With Dr Dwyane Dee, Disp: , Rfl:     ALLERGIES    Review of Patient's Allergies indicates:   Codeine camsylate       Shortness of Breath, Other (See                            Comments)    Comment:Chest pain   Motrin [ibuprofen]      Nausea Only   Augmentin [amoxicil*    Nausea and Vomiting   Bactrim                 Rash, Itching    FAMILY HISTORY      Family History    Diabetes Mother     Heart Brother     Comment: CAD and AAA    Hypertension Mother     Arthritis Mother     Stroke Mother     Diabetes Brother     Comment: same brother    Cancer - Other Father     Comment: type uncertain    Thyroid Sister     Alcohol/Drug Abuse Son     OTHER Son     Comment: ?brain aneurysm per steward records    No Known Problems Grandchild     Comment: 6    Cancer - Breast FamHxNeg     Cancer - Cervical FamHxNeg     Cancer - Ovarian FamHxNeg     Cancer - Colon FamHxNeg  Psychiatric Illness FamHxNeg        SOCIAL HISTORY    Social History    Marital status: Divorced            Spouse name:                       Years of education:                 Number of children:               Social History Main Topics    Smoking status: Current Every Day Smoker                                                     Packs/day: 0.50      Years: 31.00       Comment: quit smoking inform provided to patient    Alcohol use: No              Drug use: No              Sexual activity: Not Currently     Partners with: Female       Birth control/protection: Tubal Ligation    Social History Narrative    Lives with son    1 daughter and another son passed away    Disabled from back    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 Simons,MD, 07/15/2015, 2:56 PM             REVIEW OF SYSTEMS    All other systems reviewed and negative except as per history of present illness.    PHYSICAL EXAM    Vital Signs: BP 122/78   Pulse 118   Temp 98.2 F   Resp 18   LMP 10/22/2007   SpO2 97%   Constitutional:   No painful distress, Non-toxic appearance.   HENT:  Normocephalic, Atraumatic, Bilateral external ears normal, Oropharynx moist.  Neck: Normal range of motion. No tenderness, Supple, No stridor, No JVD.  Eyes:  PERRL, EOMI, Conjunctiva normal, No discharge.   Respiratory:   No respiratory distress, No wheezing, No chest tenderness.   Cardiovascular:  Normal heart rate, regular rhythm, No murmurs.   Abdominal:  Soft, No tenderness, No masses, No pulsatile masses.   Musculoskeletal:   No edema, No tenderness, No cyanosis, No clubbing.  No  deformities noted.   Skin:  Warm, Dry, No erythema, No rash.   Lymphatic:  No lymphadenopathy noted.   Neurologic: Alert & oriented x 3, CN 2-12 normal, normal motor function and normal sensory function except for right upper extremity: Biceps, triceps, grip strength, finger abduction and abduction 4 out of 5, diminished sensation to light touch globally through the arm, forearm and hand   Psychiatric:  Affect normal    EKG  Sinus tachycardia, no acute ischemic changes, normal intervals    RADIOLOGY  Computed tomography scan of head  Chest x-ray no acute disease as read by me  as read by me    LABS:  Reviewed      ED COURSE & MEDICAL DECISION MAKING    Pertinent Labs & Imaging studies reviewed.  Medication list reviewed.  Nursing notes reviewed.  Prior records reviewed.  Patient is a right-hand-dominant 60 year old female with a history of diabetes, hypertension,  dyslipidemia, presenting with right upper extremity weakness that she noticed at 835 this morning.  Last normal at 2 AM.  Outside of the window for TPA.  No other neurologic complaints or deficits on exam except for the right upper extremity weakness and paresthesias.  Computed tomography scan of head is negative for acute pathology.  Cardiogram, chest x-ray, lab work, also reassuring.  Case discussed with neurology on-call, Dr. Cristi Loron and recommendations are for stroke workup including magnetic resonance imaging, MRA, and echocardiogram.  Internal capsule infarct still a possibility.    Patient seen immediately upon arrival secondary to high probability of life threatening deterioration and required my full and direct attention, intervention and personal management.  Total critical care time is 32 minutes outside of separately billable procedures. These included the complexity of the case, numerous bedside evaluations of patient's clinical status, airway and hemodynamics; directly caring for the patient;  interpreting EKG, lab work, radiographs, discussing with neurology on-call and admitting physician; and documenting the chart.      FINAL IMPRESSION  Weakness of right upper extremity  History of diabetes mellitus  History of elevated lipids    DISPOSITION  Admit    CONDITION  Stable      Electronically signed by: Charline Bills. Levada Dy, DO, 05/27/2016 12:12 PM      This Emergency Department patient encounter note was created using voice-recognition software and in real time during the ED visit. Please excuse any typographical errors that have not been edited out.

## 2016-05-27 NOTE — ED Triage Note (Addendum)
Pt ambulated into ED with CC RUE numbness. States she woke up at 0835 with the sensation that she was carrying a 5 lb weight and numbness in RUE. RUE is significantly weaker than LUE. No facial droop, arm drift, change in speech or mentation. Equal strength bilaterally in BLE. Equal sensation on bilateral all extremities.   Also c/o R calf pain x 2 days, 6/10. No edema, multiple varicose veins.

## 2016-05-27 NOTE — Narrator Note (Signed)
Verbal report to [facility] RN.

## 2016-05-27 NOTE — Narrator Note (Signed)
Patient Disposition      Patient left ED 3:43 PM.      Patient belongings with patient: YES    Have all existing LDAs been addressed? Yes    Have all IV infusions been stopped? Yes    Discharged to: Admit to:  monitored bed accompanied with RN.

## 2016-05-27 NOTE — Progress Notes (Signed)
Paged - woke up with no feeling in right arm today  Feeling came back after shaking arm - few minutes  Now feels very heavy  Can lift it but feels weak  No speech changes, confusion, fever  No pain in arm  Discussed with patient - may have laid on arm in sleep and taking a while to return to normal sensation, but given age, DM, HLD - at increased risk for vascular problems of brain such as stroke.  Discussed - patient plans to present to [facility] ED as soon as possible for evaluation  Samantha Cameron P. Axel Frisk,MD, 05/27/2016, 10:50 AM

## 2016-05-27 NOTE — Narrator Note (Signed)
Pt provided food and drink per request. MD aware.

## 2016-05-27 NOTE — Narrator Note (Signed)
Report to Beth, RN

## 2016-05-27 NOTE — Narrator Note (Signed)
Portable cardiac monitor placed on pt for transport to [place] [facility].

## 2016-05-28 LAB — MRA NECK WO CONTRAST

## 2016-05-28 LAB — CREATINE KINASE TOTAL: CREATINE KINASE TOTAL: 93 IU/L (ref 26–192)

## 2016-05-28 LAB — BASIC METABOLIC PANEL
ANION GAP: 11 mmol/L (ref 5–15)
BUN (UREA NITROGEN): 17 mg/dL (ref 7–18)
CALCIUM: 9.4 mg/dL (ref 8.5–10.1)
CARBON DIOXIDE: 25 mmol/L (ref 21–32)
CHLORIDE: 103 mmol/L (ref 98–107)
CREATININE: 0.8 mg/dL (ref 0.4–1.2)
ESTIMATED GLOMERULAR FILT RATE: >60 mL/min (ref >60)
Glucose Random: 108 mg/dL (ref 74–160)
POTASSIUM: 4.5 mmol/L (ref 3.5–5.1)
SODIUM: 139 mmol/L (ref 136–145)

## 2016-05-28 LAB — ECHOCARDIOGRAM W/ DOPPLER

## 2016-05-28 LAB — MRI BRAIN WO CONTRAST

## 2016-05-28 LAB — EKG

## 2016-05-28 LAB — MRA BRAIN WO CONTRAST

## 2016-05-28 MED ORDER — DIAZEPAM 5 MG PO TABS
5.00 mg | ORAL_TABLET | Freq: Once | ORAL | Status: AC
Start: 2016-05-28 — End: 2016-05-28
  Administered 2016-05-28: 5 mg via ORAL
  Filled 2016-05-28: qty 1

## 2016-05-28 MED ORDER — ACETAMINOPHEN 325 MG PO TABS
650.0000 mg | ORAL_TABLET | Freq: Four times a day (QID) | ORAL | Status: DC | PRN
Start: 2016-05-28 — End: 2016-05-28

## 2016-05-28 MED ORDER — MAGNESIUM SULFATE IN D5W 1-5 GM/100ML-% IV SOLN
3.00 g | Freq: Once | INTRAVENOUS | Status: AC
Start: 2016-05-28 — End: 2016-05-28
  Administered 2016-05-28: 3 g via INTRAVENOUS
  Filled 2016-05-28: qty 300

## 2016-05-28 NOTE — Initial Assessments (Signed)
S:  "I couldn't feel my arm before, but it's better now."  No pain per pt.    O:      05/28/16 1400   Sensation   Light Touch (resolved)   Proprioception   Proprioception deficits? No apparent deficits   IADL / ADL   IADL/ADL (Ind)   RUE Assessment   RUE Assessment X   RUE Strength   R Shoulder Flexion 4-/5   R Elbow Flexion 4/5   R Elbow Extension 4/5   R Wrist Flexion 4/5   R Wrist Extension 4-/5   LUE Assessment   LUE Assessment WFL   L Hand Evaluation   L Hand Evaluation WFL   Ther Exercise   Therapeutic Exercise? (Pt ed in AROM exercises)   Coordination   Gross Motor Performance Observation Weak   Dexterity (improving)   Plan   OT Frequency Evaluation only       A:  60 y/o RHD female who awoke yesterday morning with strength and sensory deficits R UE.  Came to ED and admitted to hospital.  CT, MRI/MRA negative for CVA; pt dx'd with probable nerve compression R UE.  Now with full AROM, resolved sensory deficits, and improving strength.  Pt is able to do all ADL's, manipulate knife and fork, and hold objects R hand.  Instructed in AROM exercises for home.    P:  OT evaluation completed.  No further intervention indicated.

## 2016-05-28 NOTE — RN Shift Note (Signed)
BP was low at beginning of shift. Pt is asymptomatic. PA made aware of low BP. Routine EKG was done as ordered. Read by PA. Will continue to monitor

## 2016-05-28 NOTE — RN Shift Note (Signed)
Pt slept well. Denies c/p. Will continue to monitor

## 2016-05-28 NOTE — Plan of Care (Signed)
Problem: Pain  Goal: Patients pain/discomfort is manageable  Assess and monitor patients pain using appropriate pain scale. Collaborate with interdisciplinary team and initiate plan and interventions as ordered. Re-assess patients pain level 30 - 60 minutes after pain management intervention.   Pt denies pain at present time

## 2016-05-28 NOTE — Progress Notes (Signed)
Patient A&O x4, VSS, denies pain. Sitting up at bedside, discharge teaching and paper completed, copy handed to patient. PIV removed, site d/c/i. Safety maintained. Awaiting ride to home.

## 2016-05-29 ENCOUNTER — Encounter (HOSPITAL_BASED_OUTPATIENT_CLINIC_OR_DEPARTMENT_OTHER): Payer: Self-pay

## 2016-05-29 NOTE — Telephone Encounter (Signed)
Called and spoke with patient, she reports she is feeling okay, no further issues/concerns. Has appt scheduled on Monday, declined sooner appt. Advised she call in the meantime with any issues/concerns, she agreed.

## 2016-05-29 NOTE — Progress Notes (Signed)
Patient care needs discussed with nsg/team/md via MDR 05/28/16.  60 year old a/o x 3, self care woman presenting with RUE weakness ? CVA/TIA.  Per MD, neg w/u.  Patient improved and discharged to home 05/28/16 pm via family member (resides at home with son).  Out pt f/u per direction of MD.

## 2016-05-31 NOTE — CODING QUERY (Signed)
[facility] FORM    Patient: Samantha Cameron  MRN: [id number]  DOB: 02-18-1956  ACCT: [id number]     A review of the medical record demonstrated the following electrolyte abnormalities:    Lab Value Value Actual  Lab Value   Magnesium < 1.8 > 2.6 1.3     Corresponding treatment noted:  Repletion      Monitoring/Evaluation noted:  Fluid status    Provider: Dr. Maudie Mercury  Given the clinical indicators and/or symptoms, the documented treatment plan and/or monitoring/evaluation, the clinical picture correlates to the following:    Please characterize any electrolyte abnormality:  Hypomagnesemia    Is the cause of the electrolyte abnormality known? (indicate all that apply):  Renal Disease        THIS DOCUMENT IS A PERMANENT PART OF THE MEDICAL RECORD

## 2016-06-04 ENCOUNTER — Ambulatory Visit (HOSPITAL_BASED_OUTPATIENT_CLINIC_OR_DEPARTMENT_OTHER): Payer: MEDICARE | Admitting: Physician Assistant

## 2016-06-04 VITALS — BP 102/68 | HR 110 | Temp 96.0°F | Wt 223.0 lb

## 2016-06-04 DIAGNOSIS — M542 Cervicalgia: Secondary | ICD-10-CM

## 2016-06-04 DIAGNOSIS — R29898 Other symptoms and signs involving the musculoskeletal system: Secondary | ICD-10-CM

## 2016-06-04 LAB — BASIC METABOLIC PANEL
ANION GAP: 10 mmol/L (ref 5–15)
BUN (UREA NITROGEN): 14 mg/dL (ref 7–18)
CALCIUM: 9 mg/dL (ref 8.5–10.1)
CARBON DIOXIDE: 26 mmol/L (ref 21–32)
CHLORIDE: 105 mmol/L (ref 98–107)
CREATININE: 0.7 mg/dL (ref 0.4–1.2)
ESTIMATED GLOMERULAR FILT RATE: >60 mL/min (ref >60)
Glucose Random: 101 mg/dL (ref 74–160)
POTASSIUM: 4.2 mmol/L (ref 3.5–5.1)
SODIUM: 141 mmol/L (ref 136–145)

## 2016-06-04 LAB — PHOSPHORUS MAGNESIUM
MAGNESIUM: 1.3 mg/dL — ABNORMAL LOW (ref 1.8–2.4)
PHOSPHORUS: 4.1 mg/dL (ref 2.9–5.3)

## 2016-06-04 NOTE — Progress Notes (Signed)
Samantha Cameron is a 60 year old female patient of Christopher P. Simons,MD  presenting for hospital follow up      Patient was admitted at the [facility] ED from  07/09 to 07/10 with RUE weakness. Patient woke up with weakness, paresthesia of the right arm after going to sleep in her usual state of health.  CVA was ruled out. She had unremarkable brain MRI/MRA, TTE, neck MRA head CT. All labs were within normal limits.  Symptoms were quickly improved while in hospital. OT was consulted and recommended stretching exercises at home. Patient reports today, the arm still feels slightly heavy. She is able to move arm. Patient reports history of neck pain and history of cervical stenosis (she reports she was told in the past from previous doctors she might need neck surgery). She reports off and on neck pain,  denies neck pain today. Patient reports her left arm has always been weak because of stenosis. Now she feels like the right is now weaker    Patient Active Problem List:     Left genital labial abscess     Tobacco use disorder     Spinal stenosis of lumbar region     HLD (hyperlipidemia)     Chronic bilateral low back pain without sciatica     Skin change     Tachycardia     Pineal gland cyst     History of vitamin D deficiency     Hemorrhoids     Venous (peripheral) insufficiency     Adrenal adenoma     Type 2 diabetes mellitus with microalbuminuria, without long-term current use of insulin (HCC)     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 (HCC)     Acute nonintractable headache     Cough     Right ear impacted cerumen     Acute pain of right wrist     Abnormal PFTs     Screening for malignant neoplasm of breast     Skin irritation     H/O bone density study      Current Outpatient Prescriptions on File Prior to Visit:  lisinopril (PRINIVIL,ZESTRIL) 20 MG tablet Take 1 tablet by mouth daily Disp: 90 tablet Rfl: 3   metFORMIN (GLUCOPHAGE-XR) 500 MG 24 hr tablet Take 4 tablets by mouth daily with breakfast  Disp: 120 tablet Rfl: 11   Lancets Use 1 time daily.  Dispense lancets that are covered by insurance. Dx e11.29 Disp: 100 each Rfl: 11   atorvastatin (LIPITOR) 20 MG tablet Take 1 tablet by mouth daily Disp: 90 tablet Rfl: 3   albuterol (PROVENTIL HFA,VENTOLIN HFA, PROAIR HFA) 108 (90 BASE) MCG/ACT inhaler Inhale 2 puffs into the lungs every 6 (six) hours as needed for Wheezing or Shortness of breath Disp: 1 Inhaler Rfl: 11   metoprolol (TOPROL-XL) 25 MG 24 hr tablet Take 0.5 tablets by mouth daily At bedtime- half pill Disp: 15 tablet Rfl: 12   Omega-3 Fatty Acids (FISH OIL) 1000 MG CAPS Take by mouth Disp:  Rfl:    cholecalciferol (VITAMIN D3) 1000 UNIT tablet Take 1 tablet by mouth daily Disp:  Rfl:    glucose blood (ONETOUCH VERIO) test strip Use as instructed Disp: 100 strip Rfl: 11   pregabalin (LYRICA) 100 MG capsule Take 100 mg by mouth 2 (two) times daily With Dr Dwyane Dee Disp:  Rfl:    fluticasone (FLONASE) 50 MCG/ACT nasal spray 1 spray by Each Nostril route daily. Disp:  Rfl:  oxycodone-acetaminophen (PERCOCET) 5-325 MG per tablet Take 1 tablet by mouth 2 (two) times daily as needed With Dr Dwyane Dee Disp:  Rfl:    aspirin 81 MG tablet Take 81 mg by mouth daily. Disp:  Rfl:      No current facility-administered medications on file prior to visit.          Review of Systems   Cardiovascular: Negative for chest pain.   Neurological: Negative for dizziness and headaches.     OBJECTIVE  BP 102/68   Pulse 110   Temp 96 F (35.6 C) (Temporal)   Wt 101.2 kg (223 lb)   LMP 10/22/2007   SpO2 97%   BMI 42.38 kg/m2    Physical Exam   Constitutional: She is oriented to person, place, and time. No distress.   Cardiovascular: Normal rate and regular rhythm.    Pulmonary/Chest: Effort normal and breath sounds normal.   Musculoskeletal:        Cervical back: She exhibits decreased range of motion and pain. She exhibits no tenderness.   Neurological: She is alert and oriented to person, place, and time. She displays no  atrophy. No sensory deficit. She exhibits normal muscle tone. Coordination and gait normal.   Left hand grip strength stronger compared to right. However, overall left arm weaker than right when resisting against applied force.    Psychiatric: Judgment and thought content normal.       ASSESSMENT/PLAN:    (R29.898) Upper extremity weakness  (primary encounter diagnosis)  (M54.2) Cervical pain (neck)  Comment: Patient is reporting mild chronic weakness of the left side for years due to cervical spinal stenosis. I do not see a diagnosis or evaluation for stenosis in patient's chart. However, patient is certain that she had neck MRI and saw outside provider in the past confirming her spinal stenosis. I did appreciate some weakness of both arms on exam. Overall, left arm is weaker, but had right hand grip strength was weaker. She has limited ROM of the cervical spine.   Plan:  I will get neck MRI to evaluate stenosis   Patient will continue to closely monitor symptoms and follow up sooner if worse. Patient may need neurosurgery consult if MRI findings are consistent with cervical stenosis.    MRI SPINAL CANAL CERVICAL W/O & W/CONTR MATRL           1. The patient indicates understanding of these issues and agrees with the plan.  2.  The patient is given an After Visit Summary sheet that lists all of their medications with directions, their allergies, orders placed during this encounter, immunization dates, and follow- up instructions.  3. I reviewed the patient's medical information and medical history   4.  I reconciled the patient's medication list and prepared and supplied needed refills.  5.  I have reviewed the past medical, family, and social history sections including the medications and allergies listed in the above medical record    Electronically signed by: Celesta Gentile, PA-C, 06/04/2016, 2:43 PM    This note is electronically signed in the electronic medical record.

## 2016-06-14 ENCOUNTER — Ambulatory Visit: Payer: Self-pay | Admitting: Physician Assistant

## 2016-06-14 DIAGNOSIS — G8929 Other chronic pain: Secondary | ICD-10-CM

## 2016-06-14 DIAGNOSIS — M542 Cervicalgia: Secondary | ICD-10-CM

## 2016-06-14 DIAGNOSIS — M5031 Other cervical disc degeneration,  high cervical region: Secondary | ICD-10-CM

## 2016-06-14 LAB — MRI CERVICAL SPINE WO CONTRAST

## 2016-06-14 NOTE — Addendum Note (Signed)
Addended by: Evette Georges on: 06/14/2016 02:22 PM     Modules accepted: Orders

## 2016-06-20 ENCOUNTER — Telehealth (HOSPITAL_BASED_OUTPATIENT_CLINIC_OR_DEPARTMENT_OTHER): Payer: Self-pay | Admitting: Internal Medicine

## 2016-06-20 ENCOUNTER — Telehealth (HOSPITAL_BASED_OUTPATIENT_CLINIC_OR_DEPARTMENT_OTHER): Payer: Self-pay

## 2016-06-20 NOTE — Progress Notes (Signed)
DCF form received re: patient planning to foster or adopt child  Asking for health info ASAP  Attempted to discuss with patient by phone - number not accepting calls  Reported ongoing controlled back pain and DM, tobacco use - but cutting down and plans to quit  Complete form - will be scanned and sent  Samantha Cameron Pancake,MD, 06/20/2016, 10:32 AM

## 2016-06-20 NOTE — Telephone Encounter (Signed)
-----   Message from Mickle Asper sent at 06/20/2016  1:51 PM EDT -----  Regarding: DCF - 2ND   Contact: [phone]  MEDRITH CLABO [id number], 60 year old, female    Calls today:  Clinical Questions (NON-SICK CLINICAL QUESTIONS ONLY)    Name of person calling Cyril Mourning [facility]   Specific nature of request DCF has a child that needs to be placed today foster custody asap today. In order for this to happen the form that they have been requested needs to be filled and faxed over -- 2nd call   Return phone number [phone]  Person calling on behalf of patient: DCF       Patient's language of care: English    Patient does not need an interpreter.    Patient's PCP: Suann Larry. Simons,MD, MD

## 2016-06-20 NOTE — Progress Notes (Signed)
Received message, per epic form as completed and placed to be faxed this am. Checked with front desk staff, form was faxed at 2:47 and confirmation received.     Tried to return call to [name], no answer, left message on voicemail that form was faxed 1/2 hour ago, please call back if not received.

## 2016-06-21 NOTE — Progress Notes (Signed)
Samantha Cameron,    Please discuss with patient    MRI of her neck did confirm severe cervical spinal stenosis. This could be the cause of her arms weakness. I would like to refer her to see a neurosurgeon at the [facility] for an evaluation.

## 2016-06-25 ENCOUNTER — Encounter (HOSPITAL_BASED_OUTPATIENT_CLINIC_OR_DEPARTMENT_OTHER): Payer: Self-pay | Admitting: Internal Medicine

## 2016-06-25 ENCOUNTER — Telehealth (HOSPITAL_BASED_OUTPATIENT_CLINIC_OR_DEPARTMENT_OTHER): Payer: Self-pay

## 2016-06-25 ENCOUNTER — Ambulatory Visit (HOSPITAL_BASED_OUTPATIENT_CLINIC_OR_DEPARTMENT_OTHER): Payer: MEDICARE | Admitting: Internal Medicine

## 2016-06-25 VITALS — BP 110/74 | HR 124 | Temp 97.4°F | Wt 220.0 lb

## 2016-06-25 DIAGNOSIS — I951 Orthostatic hypotension: Secondary | ICD-10-CM

## 2016-06-25 DIAGNOSIS — E1129 Type 2 diabetes mellitus with other diabetic kidney complication: Secondary | ICD-10-CM

## 2016-06-25 DIAGNOSIS — Z609 Problem related to social environment, unspecified: Secondary | ICD-10-CM

## 2016-06-25 DIAGNOSIS — R42 Dizziness and giddiness: Secondary | ICD-10-CM

## 2016-06-25 DIAGNOSIS — F17200 Nicotine dependence, unspecified, uncomplicated: Secondary | ICD-10-CM

## 2016-06-25 DIAGNOSIS — R809 Proteinuria, unspecified: Secondary | ICD-10-CM

## 2016-06-25 DIAGNOSIS — R29898 Other symptoms and signs involving the musculoskeletal system: Secondary | ICD-10-CM

## 2016-06-25 DIAGNOSIS — Z659 Problem related to unspecified psychosocial circumstances: Secondary | ICD-10-CM

## 2016-06-25 DIAGNOSIS — R000 Tachycardia, unspecified: Secondary | ICD-10-CM

## 2016-06-25 DIAGNOSIS — M4802 Spinal stenosis, cervical region: Secondary | ICD-10-CM

## 2016-06-25 LAB — BLOOD SUGAR FINGERSTICK (POINT OF CARE): FINGERSTICK GLUCOSE: 135 mg/dl (ref 74–160)

## 2016-06-25 MED ORDER — MECLIZINE HCL 25 MG PO TABS
25.0000 mg | ORAL_TABLET | Freq: Three times a day (TID) | ORAL | 1 refills | Status: DC | PRN
Start: 2016-06-25 — End: 2017-04-20

## 2016-06-25 NOTE — Progress Notes (Signed)
Called and spoke with patient, she reports 2 years ago she was having issues with intermittent dizziness especially with position changes, provider she had then prescribed meclizine which did help, she took it a few times and sxs resolved.     two days ago sxs started to return, reports it si sometimes when bending down, yesterday it happened when she was standing at sink, wave of dizziness came over her and passed in a few seconds. Denies any associated sxs, no palpitations, chest pain. Reports her blood sugars have been good-92 this am, she is drinking plenty of fluids, no recent med changes. Asking if provider can prescribe meclizine for her again, advised it best she come in to be evaluated, important to check BP with these sxs. She agreed, appt scheduled this evening.    Advised patient of result note as below, she verbalized understanding, advised she will be contacted when appt at [facility] is scheduled.    Notes Recorded by Beaulah Dinning on 06/21/2016 at 4:26 PM  Jeani Hawking,    Please discuss with patient    MRI of her neck did confirm severe cervical spinal stenosis. This could be the cause of her arms weakness. I would like to refer her to see a neurosurgeon at the [facility] for an evaluation.

## 2016-06-25 NOTE — Progress Notes (Signed)
Referral placed  Micheal Murad P. Emilia Kayes,MD, 06/25/2016, 11:21 AM

## 2016-06-25 NOTE — Progress Notes (Signed)
Samantha Cameron is a 60 year old female here for dizziness    Patient Active Problem List:     Left genital labial abscess     Tobacco use disorder     Spinal stenosis of lumbar region     HLD (hyperlipidemia)     Chronic bilateral low back pain without sciatica     Skin change     Tachycardia     Pineal gland cyst     History of vitamin D deficiency     Hemorrhoids     Venous (peripheral) insufficiency     Adrenal adenoma     Type 2 diabetes mellitus with microalbuminuria, without long-term current use of insulin ([place])     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 (HCC)     Acute nonintractable headache     Cough     Right ear impacted cerumen     Acute pain of right wrist     Abnormal PFTs     Screening for malignant neoplasm of breast     Skin irritation     H/O bone density study    Dizziness:  Notes three years ago after her son passed away, she was cleaning her closet, stepped on a stool and developed dizziness  F/u'd with her PCP at that time due to this issue and was given a rx of Meclizine   Meclizine provided the pt with symptomatic relief  Reports new episodes of dizziness over last 2 days, feels like room spinning   Notes the dizziness occurs when the pt is changing positions from laying to standing or bending over to standing straight - like after bending over to empty the dishwasher  Dizziness only occurs for a <2 minutes at a time   Drinks six 8oz of water per day and drinks another 2-3 glasses at night since she keeps a water next to her bed  Urine is always clear   Notes she has only been eating once per day in the past 2 days  Notes her glucose level was 92 this am     Most Recent BP Reading(s)  06/25/16 : 110/74  06/04/16 : 102/68  05/28/16 : 116/76  05/17/16 : 104/56  04/12/16 : 106/75    Pt has been moving around lot due to being really busy   Notes she has only been eating once per day in the past 2 days  Most Recent Weight Reading(s)  06/25/16 : 99.8 kg (220 lb)  06/04/16 : 101.2 kg  (223 lb)  05/17/16 : 101.2 kg (223 lb)  04/12/16 : 104.2 kg (229 lb 12.8 oz)  04/09/16 : 103 kg (227 lb)    Pt was seen in clinic on 07/17 for RUE weakness - see that note for full details  MRI showed tightening around the spinal cord and around the nerve coming out of spinal cord  06/14/16 MRI cervical "Findings: Motion artifact obscures detail.      There is straightening of the normal cervical lordosis potentially    related to positioning on the MRI table, and borderline    anterolisthesis of C7 on T1. There is no facet malalignment. Marrow    signal is normal. There is no suspicious bone lesion or fracture. The    craniocervical junction is normal. The spinal cord is deformed by    degenerative changes as detailed below, but has normal signal    intensity and appears to maintain normal caliber. There is no  suspicious epidural or paraspinal mass.      Findings at each disc level are detailed below:    C2-C3: Disc bulging and mild facet arthritis cause mild spinal    stenosis and foraminal encroachment.    C3-C4: Disc degeneration with bulging and mild loss of height, and    uncovertebral joint arthritis. Resultant moderate spinal stenosis, and    foraminal encroachment which appears moderate on the left and moderate    to severe on the right.    C4-C5: Disc degeneration including loss of height, bulging and    spondylitic ridging with a focal central disc/osteophyte complex, and    uncovertebral joint arthritis, resulting in severespinal stenosis    with flattening of the ventral cord, and foraminal encroachment which    appears moderate on the right and moderate to severe on the left.    C5-C6: Severe disc degeneration with loss of height, bulging,    spondylitic ridging and endplate signal changes. Uncovertebral joint    arthritis. Resultant severe spinal stenosis asymmetrically worse on    the left, and foraminal encroachment which appears severe on the left    and moderate to  severe on the right.    C6-C7: Severe disc degeneration, with bulging, loss of height,    spondylitic ridging and endplate signal changes. Uncovertebral joint    arthritis. Resultant moderate spinal stenosis and left foraminal    encroachment, and severe encroachment of the right neural foramen.    C7-T1: Disc degeneration with bulging and spondylitic ridging,    uncovertebral joint and facet arthritis,, and borderline    spondylolisthesis, resulting in moderate foraminal encroachment.      Impression:    Multilevel spinal stenosis andforaminal encroachment due to    degenerative disc disease, uncovertebral joint and facet arthritis.    Spinal and foraminal stenosis are severe at some levels, which could    account for the patient's weakness. "  Referral was already placed to Neuro Surgery at [facility]     Now granddaughter is staying with her after DCF removed from home for maternal substance use, neglect  Pt is still smoking but only outside the house and she washes hands/changes her clothes after smoking   Notes she will let us know if she wants help with quitting in the future   Pt's granddaughter that is 64yo is living with her and she is dealing with DCF for the child    Granddaughter has a PCP in [place] that see has been seeing for the past 6 years   Notes the child has thyroid issues and the next appt with her PCP is scheduled for January  Granddaughter has her own room and pt will be able to make ends meet even with another person living with her   Reports DCF will be giving the pt some money to help take care of her granddaughter   Pt feels safe at home  No one has threatened the pt- she notes if someone threatens her, she knows who to contact     Pt has some nasal congestion today   Pt requests her ears to be looked at today   No pressure in the ears   No fever, drainage    PE:  BP 110/74   Pulse 124   Temp 97.4 F (36.3 C) (Temporal)   Wt 99.8 kg (220 lb)   LMP 10/22/2007   SpO2 96%   BMI  41.81 kg/m2  Estimated body mass index is 41.81 kg/(m^2)  as calculated from the following:    Height as of 08/15/15: 5' 0.83" (1.545 m).    Weight as of this encounter: 99.8 kg (220 lb).  Gen - Morbidly obese older female upright in chair in NAD  HEENT - MMM, nml TM and canals b/l   Ext - Warm, dry  Neuro - A+Ox3. BUE not re-examined today - see last note for details  Psych - Appropriate    Orthostatic BP  Supine 90/55  Sitting 81/58  Standing 65/45    A/P:  (R42) Vertigo  (primary encounter diagnosis)  (I95.1) Orthostatic hypotension  Comment: Likely orthostasis with described provoking factors - possible developing autonomic neuropathy with DM but has been well controlled.  Continue hydration, slow position change, trial meclizine prn but may not be helpful  Plan:  meclizine (ANTIVERT) 25 MG TABS  Instructed to take 1 tablet of Meclizine q8hrs prn.   Instructed to change positions slowly. Continue to stay well hydrated, eat normally throughout the day and take DM medications as prescribed.   If dizziness continues to occur while changing positions slowly, RTC for possible neuro referral.     (R00.0) Tachycardia  Comment: Likely with orthostasis as above  Plan: Will continue to monitor.     (E66.01) 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 (HCC)  Comment: By BMI  Plan: Discussed portion control, healthy food choice, exercise    (E11.29,  R80.9) Type 2 diabetes mellitus with microalbuminuria, without long-term current use of insulin (HCC)  Comment: Well controlled at last visit  Plan: Continue medication regimen as prescribed.   Pt has an upcoming appt with Opthalmology on 10/02.     (Z65.9) Social problem  Comment: Now taking care of granddaughter  Plan: Pt will contact the clinic if there is any way we can further assist or help the pt.     (R29.898) Right arm weakness  Comment: Likely related to cervical spinal and neuroforaminal stenosis, not reassessed today but discussed MRI with patient  Plan: Referral  was already placed for Neuro Surgery at [facility].     (F17.200) Tobacco use disorder  Comment: Contemplative, cutting down  Plan: Pt will RTC when she is ready to quit for more information.   Advised to smoke outside the home, change clothes after re-entering the home after smoking and wash hands after smoking.     RTC in one month for DM and dizziness f/u     By signing my name below, I, Press photographer, attest that this documentation has been prepared under the direction and in the prescence of Osa Craver, M.D.  Electronically Signed: Sanjuana Mae, Medical Scribe. 06/25/16 7:32 PM    I, Osa Craver, personally performed the services described in this documentation. All medical record entries made by the scribe were at my direction and in my presence. I have reviewed the chart and discharge instructions (if applicable) and agree that the record reflects my personal performance and is accurate and complete.   Keilly Fatula P. Isella Slatten,MD, 06/26/2016, 1:26 PM

## 2016-06-25 NOTE — Telephone Encounter (Signed)
-----   Message from Faith Rogue sent at 06/25/2016  9:51 AM EDT -----  Regarding: medication question  Contact: [phone]  Samantha Cameron [id number], 60 year old, female    Calls today:  Clinical Questions ([place])    Name of person calling [name]  Specific nature of request pt states has dizzy spells when she bends over states this happened in the past and her previous pcp had written her a rx for meclazine pt not sure of the spelling  Was wondering if she could get a rx for this again  Please advise   Return phone number [phone]  Person calling on behalf of patient: Patient (self)    CALL BACK NUMBER:   Best time to call back:   Cell phone:   Other phone:    Patient's language of care: English    Patient     Patient's PCP: Harrell Gave P. Simons,MD, MD

## 2016-06-26 DIAGNOSIS — R42 Dizziness and giddiness: Secondary | ICD-10-CM | POA: Insufficient documentation

## 2016-06-26 DIAGNOSIS — I951 Orthostatic hypotension: Secondary | ICD-10-CM | POA: Insufficient documentation

## 2016-06-26 DIAGNOSIS — Z609 Problem related to social environment, unspecified: Secondary | ICD-10-CM | POA: Insufficient documentation

## 2016-06-26 DIAGNOSIS — Z659 Problem related to unspecified psychosocial circumstances: Secondary | ICD-10-CM | POA: Insufficient documentation

## 2016-06-26 DIAGNOSIS — R29898 Other symptoms and signs involving the musculoskeletal system: Secondary | ICD-10-CM | POA: Insufficient documentation

## 2016-06-28 ENCOUNTER — Other Ambulatory Visit (HOSPITAL_BASED_OUTPATIENT_CLINIC_OR_DEPARTMENT_OTHER): Payer: Self-pay | Admitting: Internal Medicine

## 2016-06-28 MED ORDER — GLUCOSE BLOOD VI STRP
ORAL_STRIP | Freq: Every day | 11 refills | Status: AC
Start: 2016-06-28 — End: 2017-06-28

## 2016-06-28 MED ORDER — GLUCOSE BLOOD VI STRP
ORAL_STRIP | 11 refills | Status: DC
Start: 2016-06-28 — End: 2016-06-28

## 2016-06-28 NOTE — Progress Notes (Signed)
PER Patient (self), Samantha Cameron is a 60 year old female has requested a refill of Test strips.      Last Office Visit: 06/25/16  Last Physical Exam: 08/15/15      Other Med Adult:  Most Recent BP Reading(s)  06/25/16 : 110/74          Cholesterol (mg/dL)   Date Value   07/15/2015 162   ----------    LOW DENSITY LIPOPROTEIN DIRECT (mg/dL)   Date Value   07/15/2015 75   ----------    HIGH DENSITY LIPOPROTEIN (mg/dL)   Date Value   07/15/2015 49   ----------    TRIGLYCERIDES (mg/dl)   Date Value   02/08/2009 189 (H)   ----------        THYROID SCREEN TSH REFLEX FT4 (uIU/mL)   Date Value   04/12/2016 1.260   ----------      No results found for: TSH      HEMOGLOBIN A1C (%)   Date Value   01/24/2016 6.1 (H)   ----------        INR (no units)   Date Value   05/27/2016 1.0   02/01/2012 < 1.0 (L)   02/07/2009 1.0 (L)   ----------      SODIUM (mmol/L)   Date Value   05/28/2016 141   ----------      POTASSIUM (mmol/L)   Date Value   05/28/2016 4.2   ----------          CREATININE (mg/dL)   Date Value   05/28/2016 0.7   ----------      Documented patient preferred pharmacies:    [facility] - [place], [place]  Phone: [phone] Fax: [phone]

## 2016-06-28 NOTE — Progress Notes (Signed)
PER Pharmacy, Samantha Cameron is a 60 year old female has requested a refill of One Touch Verio Test Strips.    Please note: scripts from today is no valid for the insurance because Sig: Use as instructed, pharmacy will need a new scripts with new sig of how many time a day patient need to use test strips. Please review.      Last Office Visit: 06/25/16 with Dr. [name]  Last Physical Exam: 08/15/15      Other Med Adult:  Most Recent BP Reading(s)  06/25/16 : 110/74          Cholesterol (mg/dL)   Date Value   07/15/2015 162   ----------    LOW DENSITY LIPOPROTEIN DIRECT (mg/dL)   Date Value   07/15/2015 75   ----------    HIGH DENSITY LIPOPROTEIN (mg/dL)   Date Value   07/15/2015 49   ----------    TRIGLYCERIDES (mg/dl)   Date Value   02/08/2009 189 (H)   ----------        THYROID SCREEN TSH REFLEX FT4 (uIU/mL)   Date Value   04/12/2016 1.260   ----------      No results found for: TSH      HEMOGLOBIN A1C (%)   Date Value   01/24/2016 6.1 (H)   ----------        INR (no units)   Date Value   05/27/2016 1.0   02/01/2012 < 1.0 (L)   02/07/2009 1.0 (L)   ----------      SODIUM (mmol/L)   Date Value   05/28/2016 141   ----------      POTASSIUM (mmol/L)   Date Value   05/28/2016 4.2   ----------          CREATININE (mg/dL)   Date Value   05/28/2016 0.7   ----------      Documented patient preferred pharmacies:    [facility] - [place], [place]  Phone: [phone] Fax: [phone]

## 2016-06-29 ENCOUNTER — Telehealth (HOSPITAL_BASED_OUTPATIENT_CLINIC_OR_DEPARTMENT_OTHER): Payer: Self-pay

## 2016-06-29 NOTE — Progress Notes (Signed)
.    [facility] PT-1 Request    PT-1 Form Summary  Your PT-1 request(s) have been submitted. Please print this screen using your browser print button for confirmation of your submission or keep the tracking number available for your records.  Treater Name:[facility] Tracking [id number]   Member Name:Sailors, [name] I Status:Pending   RequestType:New Form Created:06/29/2016 10:47:00 AM      Facility Location: Member Pickup Address:   [facility], [address], [place], [place], [place], [address]   [phone] [place] within [employer]:- No   Requested Service:- Orthopedic   Duration of Services:- 12 MONTHS   Frequency of Services:- 3 visit(s) per [employer] van needed:- No   Escort accompanying the member:- Yes   Service Animal needed:- No

## 2016-07-02 ENCOUNTER — Telehealth (HOSPITAL_BASED_OUTPATIENT_CLINIC_OR_DEPARTMENT_OTHER): Payer: Self-pay | Admitting: Internal Medicine

## 2016-07-02 NOTE — Progress Notes (Signed)
Pt calls with report she has been out of her DM test strips for two days.   States she called her pharmacy for refill   Was told her pcp needed to fill out medicare paperwork   When she checked today she was told the paperwork was not in yet   She denies abrupt vision changes, urinary frequency, chest pain, shakes, chills   Reports her AM BGs prior to running out of test strips had been in high 90s to low 100s    States she takes only metformin for her DM (I was on the road when I took the call and could not verify this, though I see now that she is correct)    A/P 60 year old woman who has run out of test strips who has DM and is not having s/s hyper or hypoglycemia. She is not on any agents that should put her at risk for hypoglycemia.    --Explained there is little risk in skipping her AM BG checks for a few days while we sort this out given the excellent control she has  --Explained that paperwork requests can take up to a week to complete  --Reviewed s/s hypoglycemia and hyperosmolar state and advised her to call for these    As requested by the pt I will route this message to her PCP.    Luisa Dago. Jayanth Szczesniak, MD, 07/02/2016, 8:08 PM

## 2016-07-03 ENCOUNTER — Telehealth (HOSPITAL_BASED_OUTPATIENT_CLINIC_OR_DEPARTMENT_OTHER): Payer: Self-pay

## 2016-07-03 NOTE — Telephone Encounter (Signed)
-----   Message from Faith Rogue sent at 07/03/2016  8:11 AM EDT -----  Regarding: [facility] needs DWO signed asap  Contact: [phone]  CRYSTALLEE MANCILLAS [id number], 60 year old, female    Calls today:  Clinical Questions ([place])    Name of person calling [facility]  Specific nature of request Needs DWO completed and signed by Dr. [name] and faxed back asap   glucose blood (ONETOUCH VERIO) test strip  Fax [phone]    Return phone number [phone]  Person calling on behalf of patient: Pharmacy    CALL BACK NUMBER:   Best time to call back:   Cell phone:   Other phone:    Patient's language of care: English    Patient     Patient's PCP: Harrell Gave P. Simons,MD, MD

## 2016-07-03 NOTE — Progress Notes (Signed)
Completed this AM, will be faxed  Casara Perrier P. Wealthy Danielski,MD, 07/03/2016, 9:23 AM

## 2016-07-03 NOTE — Progress Notes (Signed)
Called [facility] back to ask that they re-fax form, they reported they faxed it this am, confirmed fax number.

## 2016-07-09 ENCOUNTER — Encounter (HOSPITAL_BASED_OUTPATIENT_CLINIC_OR_DEPARTMENT_OTHER): Payer: Self-pay | Admitting: Internal Medicine

## 2016-07-22 ENCOUNTER — Other Ambulatory Visit (HOSPITAL_BASED_OUTPATIENT_CLINIC_OR_DEPARTMENT_OTHER): Payer: Self-pay | Admitting: Internal Medicine

## 2016-07-22 DIAGNOSIS — R000 Tachycardia, unspecified: Secondary | ICD-10-CM

## 2016-07-23 NOTE — Progress Notes (Signed)
Person calling on behalf of patient: Pharmacy    Samantha Cameron is a 60 year old female who is a patient requesting refills for medication(s)      - medication(s) request: Toprol XL 25 mg  - last office visit: 06/25/16  - last physical exam: 08/15/15      HTN Med:    Most Recent BP Reading(s)  06/25/16 : 110/74  06/04/16 : 102/68  05/28/16 : 116/76          Documented patient preferred pharmacies:    [facility] [place] [place]  Phone: [phone] Fax: [phone]

## 2016-08-06 ENCOUNTER — Encounter (HOSPITAL_BASED_OUTPATIENT_CLINIC_OR_DEPARTMENT_OTHER): Payer: Self-pay | Admitting: Internal Medicine

## 2016-08-06 ENCOUNTER — Ambulatory Visit (HOSPITAL_BASED_OUTPATIENT_CLINIC_OR_DEPARTMENT_OTHER): Payer: MEDICARE | Admitting: Internal Medicine

## 2016-08-06 VITALS — BP 98/62 | HR 106 | Wt 220.0 lb

## 2016-08-06 DIAGNOSIS — R29898 Other symptoms and signs involving the musculoskeletal system: Secondary | ICD-10-CM

## 2016-08-06 DIAGNOSIS — G4700 Insomnia, unspecified: Secondary | ICD-10-CM

## 2016-08-06 DIAGNOSIS — F17200 Nicotine dependence, unspecified, uncomplicated: Secondary | ICD-10-CM

## 2016-08-06 DIAGNOSIS — E1129 Type 2 diabetes mellitus with other diabetic kidney complication: Secondary | ICD-10-CM

## 2016-08-06 DIAGNOSIS — R809 Proteinuria, unspecified: Secondary | ICD-10-CM

## 2016-08-06 DIAGNOSIS — R42 Dizziness and giddiness: Secondary | ICD-10-CM

## 2016-08-06 LAB — LOW DENSITY LIPOPROTEIN DIRECT: LOW DENSITY LIPOPROTEIN DIRECT: 67 mg/dL (ref 0–189)

## 2016-08-06 LAB — BLOOD SUGAR FINGERSTICK (POINT OF CARE): FINGERSTICK GLUCOSE: 139 mg/dl (ref 74–160)

## 2016-08-06 LAB — HIGH DENSITY LIPOPROTEIN: HIGH DENSITY LIPOPROTEIN: 44 mg/dL (ref 40–?)

## 2016-08-06 LAB — CHOLESTEROL: Cholesterol: 143 mg/dL (ref 0–239)

## 2016-08-06 MED ORDER — NICOTINE POLACRILEX 4 MG MT GUM: 4 mg | each | OROMUCOSAL | 2 refills | 0 days | Status: AC | PRN

## 2016-08-06 MED ORDER — NICOTINE POLACRILEX 4 MG MT GUM
4.00 mg | CHEWING_GUM | OROMUCOSAL | 2 refills | Status: AC | PRN
Start: 2016-08-06 — End: 2016-10-05

## 2016-08-06 NOTE — Progress Notes (Signed)
Samantha Cameron is a 60 year old female here for dizziness and DM f/u.     Patient Active Problem List:     Left genital labial abscess     Tobacco use disorder     Spinal stenosis of lumbar region     HLD (hyperlipidemia)     Chronic bilateral low back pain without sciatica     Skin change     Tachycardia     Pineal gland cyst     History of vitamin D deficiency     Hemorrhoids     Venous (peripheral) insufficiency     Adrenal adenoma     Type 2 diabetes mellitus with microalbuminuria, without long-term current use of insulin ([place])     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 (HCC)     Acute nonintractable headache     Cough     Right ear impacted cerumen     Acute pain of right wrist     Abnormal PFTs     Screening for malignant neoplasm of breast     Skin irritation     H/O bone density study     Vertigo     Orthostatic hypotension     Social problem     Right arm weakness    Reports dizziness has improved  Last dizzy spell was one week ago  Believes that the medication and water intake helped to relieve sx     Reports her highest fasting glucose was 106 and lowest was 89   Currently taking Metformin 2,000mg  qam, Metoprolol 12.5mg  qhs, Lipitor 20mg  qd, Lisinopril 20mg  qd  No hypoglycemia sx  Has eye appt 10/2  awq negative  Urine for microalb and labs today  HEMOGLOBIN A1C (%)   Date Value       01/24/2016 6.1 (H)   07/15/2015 6.2 (H)     Currently still smoking >1/2ppd   Notes she gets cravings for cigarettes  Is willing to quit at this time- would like to try the gum     Most Recent BP Reading(s)  08/06/16 : 98/62  06/25/16 : 110/74  06/04/16 : 102/68  05/28/16 : 116/76  05/17/16 : 104/56    Pt was been walking to get exercise   Is trying to lose weight   Most Recent Weight Reading(s)  08/06/16 : 99.8 kg (220 lb)  06/25/16 : 99.8 kg (220 lb)  06/04/16 : 101.2 kg (223 lb)  05/17/16 : 101.2 kg (223 lb)  04/12/16 : 104.2 kg (229 lb 12.8 oz)    Notes she has been having difficulty sleeping   Sometimes this  is due to getting up and having to go to the bathroom and other times it is because she is tossing and turning at night  Reports she was dx'd with sleep apnea in the past because she stopped breathing while sleeping, was given a CPAP but never had a sleep study   Does not know if she snores or not   Low Epworth Sleepiness scale, does not qualify for sleep study     Notes Neuro Surg recommend that she f/u with PT for her R arm weakness   Having EMG as well    Last dental appt was in April   Notes since she has to pay $600 up front for the deep cleaning she has put it off     Pt got the flu shot at [place] on the last week of August or  first week of September       PE:  BP 98/62   Pulse 106   Wt 99.8 kg (220 lb)   LMP 10/22/2007   SpO2 97%   BMI 41.81 kg/m2  Estimated body mass index is 41.81 kg/(m^2) as calculated from the following:    Height as of 08/15/15: 5' 0.83" (1.545 m).    Weight as of this encounter: 99.8 kg (220 lb).  Gen - Morbidly obese female upright in chair in NAD  HEENT - MMM  CV - RRR, no m/r/g  Resp - CTAB  Ext - Warm, dry  Neuro - A+Ox3  Psych - Appropriate    Results for CANDE, HAMMERMAN (MRN [id number])   Ref. Range 08/06/2016 18:43   FINGERSTICK GLUCOSE Latest Ref Range: 74 - 160 mg/dl 139       A/P:  (E11.29,  R80.9) Type 2 diabetes mellitus with microalbuminuria, without long-term current use of insulin (HCC)  (primary encounter diagnosis)  Comment: Controlled at last check, continue current regimen, lifestyle change  Plan: COLLECTION VENOUS BLOOD VENIPUNCTURE,         HEMOGLOBIN A1C, ALBUMIN URINE RANDOM,         CHOLESTEROL, HIGH DENSITY LIPOPROTEIN, LOW         DENSITY LIPOPROTEIN,DIRECT  Will re-check A1c, cholesterol levels, urine micro albumin today.   Upcoming eye appt    (R42) Vertigo  Comment: Improved with hydration, meclizine  Plan: Will continue to monitor. Continue to increase water intake.     (E66.01) 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 (HCC)  Comment: By  BMI  Plan: Discussed portion control, healthy food choice, exercise.     (F17.200) Tobacco use disorder  Comment: Ready for quit attempt  Plan: nicotine polacrilex (NICORETTE) 4 MG gum  Instructed pt to take 1 piece of the nicotine gum prn for cravings. Discussed proper use.     (R29.898) Right arm weakness  Comment: Discussed - had neurosurg eval - plan for EMG and PT  Plan:  REFERRAL TO PHYSICAL THERAPY ( INT)  Instructed pt to f/u with PT. Pt was given the phone number to call and schedule appt.     (G47.00) Insomnia, unspecified type  Comment: discussed, low epworth score  Plan: Will continue to monitor. Discussed that pt does not qualify for a sleep study at this time.     RTC in 3 months for DM f/u     By signing my name below, I, Press photographer, attest that this documentation has been prepared under the direction and in the prescence of Osa Craver, M.D.  Electronically Signed: Sanjuana Mae, Medical Scribe. 08/06/16 6:56 PM    I, Osa Craver, personally performed the services described in this documentation. All medical record entries made by the scribe were at my direction and in my presence. I have reviewed the chart and discharge instructions (if applicable) and agree that the record reflects my personal performance and is accurate and complete.   Aragon Scarantino P. Darcee Dekker,MD

## 2016-08-07 LAB — MICROALBUMIN RANDOM URINE
ALB/CREAT RATIO URINE RAN: 261 ug/mg (ref 0–30)
ALBUMIN URINE RANDOM: 45.2 mg/dL — ABNORMAL HIGH (ref 0.0–1.9)
CREATININE RANDOM URINE: 173 mg/dl

## 2016-08-07 LAB — HEMOGLOBIN A1C
ESTIMATED AVERAGE GLUCOSE: 140 (ref 74–160)
HEMOGLOBIN A1C: 6.5 % — ABNORMAL HIGH (ref 4.0–5.6)

## 2016-08-20 ENCOUNTER — Ambulatory Visit (HOSPITAL_BASED_OUTPATIENT_CLINIC_OR_DEPARTMENT_OTHER): Payer: MEDICARE | Admitting: Ophthalmology

## 2016-08-20 DIAGNOSIS — G4700 Insomnia, unspecified: Secondary | ICD-10-CM | POA: Insufficient documentation

## 2016-08-20 DIAGNOSIS — H524 Presbyopia: Secondary | ICD-10-CM

## 2016-08-20 DIAGNOSIS — H5213 Myopia, bilateral: Secondary | ICD-10-CM

## 2016-08-20 DIAGNOSIS — E119 Type 2 diabetes mellitus without complications: Secondary | ICD-10-CM

## 2016-08-20 DIAGNOSIS — H52203 Unspecified astigmatism, bilateral: Secondary | ICD-10-CM

## 2016-08-20 NOTE — Nursing Note (Signed)
60 year old female Here for Diabetic  Eye Exam OU.    Patient States:  (-) Vision Changes   (-) Flashes   (-) Floaters  (-) Eye Pain   (-) Redness and Irritation   (+) DM Type II DX 2006    Ocular History:  DM Type II   Myopia with Astigmatism OU   Presbyopia OU     Ocular Medication:   None

## 2016-08-20 NOTE — Nursing Note (Signed)
For diabetic eye exam to evaluate for retinopathy.      HEMOGLOBIN A1C (%)   Date Value   08/06/2016 6.5 (H)   01/24/2016 6.1 (H)   07/15/2015 6.2 (H)   ----------

## 2016-08-20 NOTE — Progress Notes (Signed)
.  Samantha Cameron was seen at the [facility] for a diabetic eye exam on 08/20/2016.    The patient underwent a comprehensive dilated fundus exam that did not reveal any evidence of diabetic retinopathy.    The patient was instructed to return to [facility] yearly for a dilated fundus exam and to aim for good glucose control to reduce the risk of retinopathy.

## 2016-08-21 ENCOUNTER — Ambulatory Visit (HOSPITAL_BASED_OUTPATIENT_CLINIC_OR_DEPARTMENT_OTHER): Payer: MEDICARE | Admitting: Rehabilitative and Restorative Service Providers"

## 2016-08-21 DIAGNOSIS — R29898 Other symptoms and signs involving the musculoskeletal system: Secondary | ICD-10-CM

## 2016-08-21 NOTE — Progress Notes (Signed)
Outpatient Physical Therapy Initial Evaluation  [facility]: [facility]        HPI:  Samantha Cameron is a 60 year old female who presents to [facility] with complaints of R UE pain and weakness- sudden onset upon awakening 05/27/16 - not able to move arm and went to ED admitted and follow up with neuro at [facility] . Pain has slowly improving since onset.       Date of Onset: 05/27/16 ED visit        Patient learns best by Demonstration and Verbal Instruction.      Pain:  Today:  6  Best:  6     Alleviating Factors: supporting Right UE  Worst:  7-8  Provocative Factors: using R UE  Descriptor/Location:  Heavy weakness of R UE    Neuro:    Positive Cervical Compression pt reports cervical and lumbar stenosis    Bowl/Bladder Changes  No    Imaging:    Exam Date: 06/14/16  Exam Status: Signed      Exam: MRI CERV SPINE NON CONTRAST    Reason for Exam: patient with chronic neck pain, b/l upper extremities weakness. ?history of cervical spinal stenosis.        Reason: patient with chronic neck pain, bilateral upper extremities    weakness. Question of history of cervical spinal stenosis.    Comparison study: None.    Technique: Cervical spine MRI using sagittal and axial T2, axial    gradient echo, sagittal inversion recovery and sagittal T1-weighted    sequences.      Findings: Motion artifact obscures detail.      There is straightening of the normal cervical lordosis potentially    related to positioning on the MRI table, and borderline    anterolisthesis of C7 on T1. There is no facet malalignment. Marrow    signal is normal. There is no suspicious bone lesion or fracture. The    craniocervical junction is normal. The spinal cord is deformed by    degenerative changes as detailed below, but has normal signal    intensity and appears to maintain normal caliber. There is no    suspicious epidural or paraspinal mass.      Findings at each disc level are detailed  below:    C2-C3: Disc bulging and mild facet arthritis cause mild spinal    stenosis and foraminal encroachment.    C3-C4: Disc degeneration with bulging and mild loss of height, and    uncovertebral joint arthritis. Resultant moderate spinal stenosis, and    foraminal encroachment which appears moderate on the left and moderate    to severe on the right.    C4-C5: Disc degeneration including loss of height, bulging and    spondylitic ridging with a focal central disc/osteophyte complex, and    uncovertebral joint arthritis, resulting in severespinal stenosis    with flattening of the ventral cord, and foraminal encroachment which    appears moderate on the right and moderate to severe on the left.    C5-C6: Severe disc degeneration with loss of height, bulging,    spondylitic ridging and endplate signal changes. Uncovertebral joint    arthritis. Resultant severe spinal stenosis asymmetrically worse on    the left, and foraminal encroachment which appears severe on the left    and moderate to severe on the right.    C6-C7: Severe disc degeneration, with bulging, loss of height,    spondylitic ridging and  endplate signal changes. Uncovertebral joint    arthritis. Resultant moderate spinal stenosis and left foraminal    encroachment, and severe encroachment of the right neural foramen.    C7-T1: Disc degeneration with bulging and spondylitic ridging,    uncovertebral joint and facet arthritis,, and borderline    spondylolisthesis, resulting in moderate foraminal encroachment.      Impression:    Multilevel spinal stenosis andforaminal encroachment due to    degenerative disc disease, uncovertebral joint and facet arthritis.    Spinal and foraminal stenosis are severe at some levels, which could    account for the patient's weakness.     Function  ADLs:  Pt reports she is able to do light household chores and independent self care  Family assists with heavy lifting    Work/School  Duties:  NA    Sleeping:  Impaired pt states preferred position is R S/L    Recreational Activities:  States she does some walking for exercise    Patient's Goals for Physical Therapy:  To make pain go away      Patient Active Problem List:  Patient Active Problem List:     Left genital labial abscess     Tobacco use disorder     Spinal stenosis of lumbar region     HLD (hyperlipidemia)     Chronic bilateral low back pain without sciatica     Skin change     Tachycardia     Pineal gland cyst     History of vitamin D deficiency     Hemorrhoids     Venous (peripheral) insufficiency     Adrenal adenoma     Type 2 diabetes mellitus with microalbuminuria, without long-term current use of insulin (HCC)     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 (HCC)     Acute nonintractable headache     Cough     Right ear impacted cerumen     Acute pain of right wrist     Abnormal PFTs     Screening for malignant neoplasm of breast     Skin irritation     H/O bone density study     Vertigo     Orthostatic hypotension     Social problem     Right arm weakness     Insomnia        Medications:    Current Outpatient Prescriptions on File Prior to Visit:  nicotine polacrilex (NICORETTE) 4 MG gum Take 1 each by mouth as needed for Craving (Nicotine) Disp: 100 each Rfl: 2   metoprolol (TOPROL-XL) 25 MG 24 hr tablet Take 0.5 tablets by mouth nightly At bedtime Disp: 15 tablet Rfl: 11   glucose blood (ONETOUCH VERIO) test strip by Finger Test or Other route daily Use as instructed Disp: 100 strip Rfl: 11   meclizine (ANTIVERT) 25 MG TABS Take 1 tablet by mouth every 8 (eight) hours as needed Disp: 60 tablet Rfl: 1   lisinopril (PRINIVIL,ZESTRIL) 20 MG tablet Take 1 tablet by mouth daily Disp: 90 tablet Rfl: 3   metFORMIN (GLUCOPHAGE-XR) 500 MG 24 hr tablet Take 4 tablets by mouth daily with breakfast Disp: 120 tablet Rfl: 11   Lancets Use 1 time daily.  Dispense lancets that are covered by insurance. Dx e11.29 Disp: 100 each Rfl: 11    atorvastatin (LIPITOR) 20 MG tablet Take 1 tablet by mouth daily Disp: 90 tablet Rfl: 3   albuterol (PROVENTIL HFA,VENTOLIN HFA, PROAIR HFA) 108 (  90 BASE) MCG/ACT inhaler Inhale 2 puffs into the lungs every 6 (six) hours as needed for Wheezing or Shortness of breath Disp: 1 Inhaler Rfl: 11   Omega-3 Fatty Acids (FISH OIL) 1000 MG CAPS Take by mouth Disp:  Rfl:    cholecalciferol (VITAMIN D3) 1000 UNIT tablet Take 1 tablet by mouth daily Disp:  Rfl:    pregabalin (LYRICA) 100 MG capsule Take 100 mg by mouth 2 (two) times daily With Dr Dwyane Dee Disp:  Rfl:    fluticasone (FLONASE) 50 MCG/ACT nasal spray 1 spray by Each Nostril route daily. Disp:  Rfl:    oxycodone-acetaminophen (PERCOCET) 5-325 MG per tablet Take 1 tablet by mouth 2 (two) times daily as needed With Dr Dwyane Dee Disp:  Rfl:    aspirin 81 MG tablet Take 81 mg by mouth daily. Disp:  Rfl:      No current facility-administered medications on file prior to visit.       Home Setting  Lives alone in single level home 5th floor with elevator in building       08/21/16 1131   Language Information   Language of Care [place] Discipline PT   Visit   Visit number PT Eval   POC Due date [facility] guidelines   Pain   Pain Score 6    Spine/Thoracic precautions   Other (comments) cervical and lumbar stenosis   Patient Stated Goals   Patient stated goals to make arm pain go away   Living Situation   Lives With Alone   Posture   Forward Head Moderate   Rounded Shoulders Moderate   Protracted Scapulae  Moderate   Other (comment) slightly flexed posture with history of lumbar stenosis   Cervical Assessment   Overall Cervical all ROM increases pain   Flexion 38   Extension 18   L Rotation 70%   R Rotation 40%   L Side bend 20    R Side bend 8   LUE AROM (degrees)   L Shoulder Flexion  0-170 100 Degrees   L Shoulder Extension  0-60 35 Degrees   L Shoulder ABduction 0-40 90 Degrees   L Shoulder Internal Rotation  0-70 65 Degrees   L Shoulder External Rotation   0-90 65 Degrees   L Shoulder Horizontal ABduction 0-40 20 Degrees   LUE Strength   Overall Strength pt fatigues quickly with mm testing   L Shoulder Flexion 4/5   L Shoulder Extension 4/5   L Shoulder ABduction 4/5   L Shoulder Internal Rotation 4-/5   L Shoulder External Rotation 4-/5   L Shoulder Horizontal ABduction 4-/5   L Elbow Flexion 4/5   L Elbow Extension 4/5   RUE AROM (degrees)   R Shoulder Flexion  0-170 90 Degrees   R Shoulder Extension  0-60 35 Degrees   R Shoulder ABduction 0-140 90 Degrees   R Shoulder Internal Rotation  0-70 (to R SI)   R Shoulder External Rotation  0-90 (to occiput)   RUE PROM (degrees)   R Shoulder Flexion  0-170 155 Degrees   R Shoulder Extension 0-60 40 Degrees   R Shoulder ABduction 0-140 160 Degrees   R Shoulder Internal Rotation  0-70 60 Degrees   R Shoulder External Rotation  0-90 75 Degrees   R Shoulder Horizontal ABduction 0-40 35 Degrees   RUE Strength   R Shoulder Flexion 4/5   R Shoulder Extension 4-/5   R Shoulder  ABduction 4-/5   R Shoulder Internal Rotation 4/5   R Shoulder External Rotation 4-/5   R Shoulder Horizontal ABduction 3+/5   R Elbow Flexion 4/5   R Elbow Extension 4/5   Cervical Results   Spurling's  Negative   Compression  Positive   Functional Mobility   Gait pt with slightly antalgic gait decrease in stance on R history of lumbar stenosis   Palpation   Tenderness to Palpation B paracervical mm R UT and right wrist ( anterior )   Increased Tissue Density B cervical mm R UT   Patient Education   What was taught? PT POC   Method Verbal   Patient comprehension Yes           PT  Physical Therapy Plan of Care    [id number] P. Simons,MD, MD  Referring Provider:    Driscilla Grammes.,*  Diagnosis: Right arm weakness  (primary encounter diagnosis)   Right arm weakness [R29.898]      Assessment/Objective Findings:   Patient is a 59 year old female with complains of pain in her right Shoulder and arm     Clinical presentation today is consistent with  diagnosis of diffuse RUE weakness with cervical stenosis as contributing factor. Pt will benefit from skilled PT focused on manual therapy and exercise to address the following problems and impairments noted upon evaluation: Pain, Decreased ROM, Decreased Strength, Decreased Functional Mobility and Decreased Tolerance of ADLs.     These problems limit the patient with the following functional activities: reaching, lifting and carrying. The prescribed treatment plan of care is medically necessary.      Co-morbidities of cervical and lumbar stenosis were identified and taken into considerations of plan of care.    Medicare Related Problems:  Handling Objects: Carrying, Grasping, Lifting, Pulling, Pushing and Reaching    G-CODES:  Outcome measure used: Quick Dash  Onset code: [id number]     Mod:CL  Projected Goal code: [id number]   Mod: CJ      Short Term Functional Goals: 2 weeks.   Pt will be independent with initial HEP  Pt will report decrease in pain to < 4 R UE  Pt will have increase in AROM of R UE to > 150 of flexion and 120 of abduction.    Long Term Goal: 8 weeks.   Pt will return to PLOF   Pt will have R UE ROM to be [facility]  Pt will have at least 4-4+/ 5 strength of Right shoulder and scapula mm  Pt will have improved Quick Dash score to < 38      Treatment Plan:  ** PT Eval - Moderate Complexity (CPT 97162)  ** Stretching/ROM Exercise (97110)  ** Therapeutic Exercise (CPT 97110)  ** Home Exercise Program (CPT [phone])  ** Neuromuscular Re-education (CPT [id number])  ** Soft Tissue Mobilization (CPT 97140)  ** Hot/Cold Rx (CPT 97010)  ** Manual Traction (CPT 97140)  ** Functional Activities (CPT 97530)  ** Patient Education (CPT [phone])  ** Taping prn    Recommend Physical Therapy be continued 2 times per week for 8 weeks.  The rehabilitation potential for this patient is good with an moderately complex stable clinical presentation due to above listed barriers, functional limitations, and clinical exam.    Patient Regis Bill is  aware of attendance policy: Yes  Plan of care discussed with Patient/Family: Yes  Patient goals reviewed and incorporated in plan of care: Yes  Patient/Family agrees with plan of care: Yes  Patient/Family education: Yes  Does patient feel safe at home: Yes      Dorita Fray

## 2016-08-21 NOTE — Progress Notes (Signed)
08/21/16 1131   Language Information   Language of Care English   Spine/Thoracic precautions   Other (comments) cervical and lumbar stenosis   Rehab Discipline   Rehab Discipline PT   Visit   Visit number PT Eval   POC Due date MCR guidelines   Time Calculation   Start Time 1048   Stop Time 1138   Time Calculation (min) 50 min   Pain   Pain Score 6    Manual Therapy   Technique plan for manual cervical traction / sub-occipital release   Manual Therapy 2   Technique plan for STM / IASTM Cervical - UT and scapula mm   Ther Exercise   Exercise plan for UE strengthenig program   Ther Exercise 2   Exercise plan for R UE ROM ex   Ther Exercise 3   Exercise 3 plan for scaula strengthening   Patient Education   What was taught? PT POC   Method Verbal   Patient comprehension Yes

## 2016-08-21 NOTE — Progress Notes (Signed)
I certify that the documented Treatment Plan is reasonable and necessary.    08/21/2016  Neelie Welshans P. Carin Shipp,MD

## 2016-08-24 ENCOUNTER — Ambulatory Visit (HOSPITAL_BASED_OUTPATIENT_CLINIC_OR_DEPARTMENT_OTHER): Payer: MEDICARE | Admitting: Rehabilitative and Restorative Service Providers"

## 2016-08-24 DIAGNOSIS — R29898 Other symptoms and signs involving the musculoskeletal system: Secondary | ICD-10-CM

## 2016-08-24 NOTE — Progress Notes (Signed)
S: Pt reports that her arm feels heavy and weak.  Also c/o right wrist pain with gripping theraband    O: Refer to Rehabilitation Treatment Flowsheet   08/24/16 1500   Language Information   Language of Care English   Spine/Thoracic precautions   Other (comments) cervical and lumbar stenosis   Rehab Discipline   Rehab Discipline PT   Visit   Visit number 1   POC Due date MCR guidelines   Time Calculation   Start Time 1500   Stop Time 1535   Time Calculation (min) 35 min   Pain   Pain Score 5    Ther Exercise   Exercise pulley in sitting for assisted flexion   Reps 20   Sets 1   Ther Exercise 2   Exercise seated ball roll for flexion and scaption   Reps 2 10   Sets 2 2   Ther Exercise 3   Exercise 3 supine elbow flexion / extension with 90 of shoulder flexion   Reps 3 10   Sets 3 1   Ther Exercise 4   Exercise 4 S/L shoulder abd   Reps 4 10   Sets 4 1   Ther Exercise 5   Exercise 5 S/L ER    Reps 5 10   Sets 5 1   Ther Exercise 6   Exercise 6 scapula squeeze   Reps 6 10   Sets 6 1   Holds 6 10 sec   Patient Education   What was taught? HEP   Method Verbal;Practice;Written   Patient comprehension Yes           A: Pt had right wrist pain during low rows with RTB and deferred TB exercises.  She tolerated AA- ROM well, but does fatigue quickly with all exercises.    P: Continue as per Plan of Care

## 2016-08-28 ENCOUNTER — Ambulatory Visit (HOSPITAL_BASED_OUTPATIENT_CLINIC_OR_DEPARTMENT_OTHER): Payer: MEDICARE | Admitting: Rehabilitative and Restorative Service Providers"

## 2016-08-28 DIAGNOSIS — R29898 Other symptoms and signs involving the musculoskeletal system: Secondary | ICD-10-CM

## 2016-08-28 NOTE — Progress Notes (Signed)
S:  Patient reports 6/10 RUE pain and a heaviness in arm and wrist.      O: Refer to rehab tx flow sheet    A:  Patient responded fair to tx; pt tolerated manual cervical traction but displayed slight ms guarding during tx.      P:  Follow POC & continue to progress as tolerated.        08/28/16 1500   Language Information   Language of Care English   Spine/Thoracic precautions   Other (comments) cervical and lumbar stenosis   Rehab Discipline   Rehab Discipline PT   Visit   Visit number 2   POC Due date MCR guidelines   Time Calculation   Start Time 1505   Stop Time 1530   Time Calculation (min) 25 min   Pain   Pain Score 6    Manual Therapy   Manual Therapy Yes   Technique gentle cervical traction    Time in minutes 4   Ther Exercise   Therapeutic Exercise? Yes   Exercise pulley in sitting for assisted flexion   Reps 20   Sets 1   Ther Exercise 2   Exercise seated ball roll for flexion and scaption   Reps 2 10   Sets 2 2   Ther Exercise 6   Exercise 6 scapula squeeze   Reps 6 10   Sets 6 1   Holds 6 3"    Patient Education   What was taught? HEP as tolerated   Method Verbal;Demo;Practice   Patient comprehension Yes

## 2016-08-31 ENCOUNTER — Ambulatory Visit (HOSPITAL_BASED_OUTPATIENT_CLINIC_OR_DEPARTMENT_OTHER): Payer: Self-pay | Admitting: Rehabilitative and Restorative Service Providers"

## 2016-09-04 ENCOUNTER — Ambulatory Visit (HOSPITAL_BASED_OUTPATIENT_CLINIC_OR_DEPARTMENT_OTHER): Payer: Self-pay | Admitting: Rehabilitative and Restorative Service Providers"

## 2016-09-07 ENCOUNTER — Ambulatory Visit (HOSPITAL_BASED_OUTPATIENT_CLINIC_OR_DEPARTMENT_OTHER): Payer: Self-pay | Admitting: Rehabilitative and Restorative Service Providers"

## 2016-09-10 ENCOUNTER — Ambulatory Visit (HOSPITAL_BASED_OUTPATIENT_CLINIC_OR_DEPARTMENT_OTHER): Payer: MEDICARE | Admitting: Rehabilitative and Restorative Service Providers"

## 2016-09-14 ENCOUNTER — Ambulatory Visit (HOSPITAL_BASED_OUTPATIENT_CLINIC_OR_DEPARTMENT_OTHER): Payer: MEDICARE | Admitting: Rehabilitative and Restorative Service Providers"

## 2016-09-14 ENCOUNTER — Telehealth (HOSPITAL_BASED_OUTPATIENT_CLINIC_OR_DEPARTMENT_OTHER): Payer: Self-pay

## 2016-09-14 DIAGNOSIS — R29898 Other symptoms and signs involving the musculoskeletal system: Secondary | ICD-10-CM

## 2016-09-14 NOTE — Progress Notes (Signed)
Treater Name:  [facility]  Tracking #: [id number]    Member Name:  Cing, Kunda I  Status: Pending    RequestType:  New Form  Created: 09/14/2016 2:58:00 PM       Facility Location:  Member Pickup Address:    [place], [place], [address]  [place], [place], [address]    [phone]  [place] within [place]:  - Yes   Requested Service:  - Pain Management   Duration of Services:  - 12 MONTHS   Frequency of Services:  - 2 visit(s) per MONTH   Wheelchair van needed:  - No   Escort accompanying the member:  - Yes   Service Animal needed: - No     .

## 2016-09-14 NOTE — Progress Notes (Signed)
S: Pt reports that her arm feels heavy slowly getting better  States she has been doing HEP during absence from PT   States left arm has been weak due to cervical stenosis  O: Refer to Rehabilitation Treatment Flowsheet   09/14/16 1300   Language Information   Language of Care English   Spine/Thoracic precautions   Other (comments) cervical and lumbar stenosis   Rehab Discipline   Rehab Discipline PT   Visit   Visit number 3   POC Due date MCR guidelines   Time Calculation   Start Time 1344   Stop Time 1410   Time Calculation (min) 26 min   Pain   Pain Score 5    Manual Therapy   Technique manual cervical traction   Ther Exercise   Exercise seated low rows with RTB   Reps 10   Sets 3   Ther Exercise 2   Exercise seated elbow flex x 7 with 2# and 13 with 1#   Ther Exercise 3   Exercise 3 supine flex with YTB loop   Reps 3 10   Sets 3 2   Ther Exercise 4   Exercise 4 skull crusher with 1#   Reps 4 10   Sets 4 2   Patient Education   What was taught? HEP   Method Verbal;Demo;Practice   Patient comprehension Yes       A: Pt seen for 1st treatment since 10/10 due to transportation issues   Demonstrated good understanding of HEP   Left arm fatigue with shoulder flexion with YTB loop - improved tolerance with lighter resistance    P: Continue as per Plan of Care    .

## 2016-09-14 NOTE — Telephone Encounter (Signed)
-----   Message from Rodena Piety sent at 09/14/2016 11:55 AM EDT -----  Regarding: pt1 renewal  Contact: [phone]  Samantha Cameron [id number], 60 year old, female    Calls today:  Forms  PT1 form   Name of treating facility?[facility]  [address] address, [place], [place], [address] [place]  What are they seeing the specialist for pain management  1visit(s)  per month  Is wheelchair Lucianne Lei needed? No  Is an escort accompanying the member for assistance with ambulation? Yes]  Person calling on behalf of patient: Patient (self)        Patient's language of care: English    Patient does not need an interpreter.r    Patient's PCP: Suann Larry. Simons,MD, MD

## 2016-09-15 ENCOUNTER — Emergency Department (HOSPITAL_BASED_OUTPATIENT_CLINIC_OR_DEPARTMENT_OTHER)
Admission: RE | Admit: 2016-09-15 | Disposition: A | Discharge status: Home / Self Care | Payer: Self-pay | Source: Emergency Department | Attending: Emergency Medicine | Admitting: Emergency Medicine

## 2016-09-15 ENCOUNTER — Encounter (HOSPITAL_BASED_OUTPATIENT_CLINIC_OR_DEPARTMENT_OTHER): Payer: Self-pay | Admitting: Internal Medicine

## 2016-09-15 LAB — XR FOOT RIGHT MINIMUM 3 VIEWS

## 2016-09-15 MED ORDER — ACETAMINOPHEN 325 MG PO TABS
975.0000 mg | ORAL_TABLET | Freq: Once | ORAL | Status: AC
Start: 2016-09-15 — End: 2016-09-15
  Administered 2016-09-15: 975 mg via ORAL
  Filled 2016-09-15: qty 3

## 2016-09-15 NOTE — Progress Notes (Signed)
Case Management Screening/Assessment    Risk factors:  No CM needs identified at this time  Proceed to assessment:  No

## 2016-09-15 NOTE — ED Triage Note (Signed)
Pt presented to ed with 10/10 right foot pain, pt reported that she felt a pop out of her right foot while walking yesterday, last took tylenol last night, no redness or swelling

## 2016-09-15 NOTE — Narrator Note (Signed)
Pt return from x-ray, medicated as ordered

## 2016-09-15 NOTE — Discharge Instructions (Signed)
You were seen in the Emergency Department for foot pain.  Your xrays did not show any broken bones or fractures.    You have sprained your foot and it will take several days to get better.  Until you feel better, be sure to rest the foot and keep it elevated whenever possible.  Use ice packs (wrapped in a cloth or towel, never ice on bare skin) for twenty minutes at a time three to five times a day for the first 2-3 days.  Use ace wrap and shoe provided for comfort until you can go about your usual activities without it comfortably.  If you cannot go without it comfortably within 2-3 days, please see Podiatry for follow up.  Their phone number is provided above.    RETURN TO  ER right away for severe or different pain, numbness or weakness, or for any other concerns or worsening.      Foot Sprain  A foot sprain is an injury to one of the strong bands of tissue (ligaments) that connect and support the many bones in your feet. The ligament can be stretched too much or it can tear. A tear can be either partial or complete. The severity of the sprain depends on how much of the ligament was damaged or torn.  CAUSES  A foot sprain is usually caused by suddenly twisting or pivoting your foot.  RISK FACTORS  This injury is more likely to occur in people who:   Play a sport, such as basketball or football.   Exercise or play a sport without warming up.   Start a new workout or sport.   Suddenly increase how long or hard they exercise or play a sport.  SYMPTOMS  Symptoms of this condition start soon after an injury and include:   Pain, especially in the arch of the foot.   Bruising.   Swelling.   Inability to walk or use the foot to support body weight.  DIAGNOSIS  This condition is diagnosed with a medical history and physical exam. You may also have imaging tests, such as:   X-rays to make sure there are no broken bones (fractures).   MRI to see if the ligament has torn.  TREATMENT  Treatment varies depending on  the severity of your sprain. Mild sprains can be treated with rest, ice, compression, and elevation (RICE). If your ligament is overstretched or partially torn, treatment usually involves keeping your foot in a fixed position (immobilization) for a period of time. To help you do this, your health care provider will apply a bandage, splint, or walking boot to keep your foot from moving until it heals. You may also be advised to use crutches or a scooter for a few weeks to avoid bearing weight on your foot while it is healing.  If your ligament is fully torn, you may need surgery to reconnect the ligament to the bone. After surgery, a cast or splint will be applied and will need to stay on your foot while it heals.  Your health care provider may also suggest exercises or physical therapy to strengthen your foot.  HOME CARE INSTRUCTIONS  If You Have a Bandage, Splint, or Walking Boot:   Wear it as directed by your health care provider. Remove it only as directed by your health care provider.   Loosen the bandage, splint, or walking boot if your toes become numb and tingle, or if they turn cold and blue.  Bathing   If  your health care provider approves bathing and showering, cover the bandage or splint with a watertight plastic bag to protect it from water. Do not let the bandage or splint get wet.  Managing Pain, Stiffness, and Swelling    If directed, apply ice to the injured area:    Put ice in a plastic bag.    Place a towel between your skin and the bag.    Leave the ice on for 20 minutes, 2-3 times per day.   Move your toes often to avoid stiffness and to lessen swelling.   Raise (elevate) the injured area above the level of your heart while you are sitting or lying down.  Driving   Do not drive or operate heavy machinery while taking pain medicine.   Do not drive while wearing a bandage, splint, or walking boot on a foot that you use for driving.  Activity   Rest as directed by your health care  provider.   Do not use the injured foot to support your body weight until your health care provider says that you can. Use crutches or other supportive devices as directed by your health care provider.   Ask your health care provider what activities are safe for you. Gradually increase how much and how far you walk until your health care provider says it is safe to return to full activity.   Do any exercise or physical therapy as directed by your health care provider.  General Instructions   If a splint was applied, do not put pressure on any part of it until it is fully hardened. This may take several hours.   Take medicines only as directed by your health care provider. These include over-the-counter medicines and prescription medicines.   Keep all follow-up visits as directed by your health care provider. This is important.   When you can walk without pain, wear supportive shoes that have stiff soles. Do not wear flip-flops, and do not walk barefoot.  SEEK MEDICAL CARE IF:   Your pain is not controlled with medicine.   Your bruising or swelling gets worse or does not get better with treatment.   Your splint or walking boot is damaged.  SEEK IMMEDIATE MEDICAL CARE IF:   Your foot is numb or blue.   Your foot feels colder than normal.     This information is not intended to replace advice given to you by your health care provider. Make sure you discuss any questions you have with your health care provider.     Document Released: 04/27/2002 Document Revised: 03/22/2015 Document Reviewed: 09/08/2014  Elsevier Interactive Patient Education [employer].

## 2016-09-15 NOTE — ED Provider Notes (Signed)
I have reviewed the ED nursing notes and prior records. I have reviewed the patient's past medical history/problem list, allergies, social history and medication list.  I saw this patient primarily.    HPI:  This is a 60 year old female patient presenting with a chief complaint of right foot pain starting suddenly yesterday while she was walking.  She felt like there was a pop in her foot and wonders if she injured it although does not recall specific trauma.  Pain is worse when she moves the foot.  Did not take anything this morning.  No fever.  No other joints bothering her.  Her sugar was 107 this morning and has been well controlled recently.    ROS: Pertinent positives were reviewed as per the HPI above.     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*  08/16/2015: Osteopenia      Comment: On xray of left knee per [facility] records                06/2014 Unclear if addressed  08/15/2015: Type 2 diabetes mellitus without complication,*      Comment:  HEMOGLOBIN A1C (%) Date Value 07/15/2015 6.2                (H) ----------  [facility]  record - a1c 6.2 on                01/31/15 6.4 on 09/30/14 5.9 on 12/29/13 6.4                08/18/13   Patient Active Problem List:     Left genital labial abscess     Tobacco use disorder     Spinal stenosis of lumbar region     HLD (hyperlipidemia)     Chronic bilateral low back pain without sciatica     Skin change     Tachycardia     Pineal gland cyst     History of vitamin D deficiency     Hemorrhoids     Venous (peripheral) insufficiency     Adrenal adenoma     Type 2 diabetes mellitus with microalbuminuria, without long-term current use of insulin ([place])     Morbid obesity with  body mass index of 40.0-49.9 (HCC)     Acute nonintractable headache     Cough     Right ear impacted cerumen     Acute pain of right wrist     Abnormal PFTs     Screening for malignant neoplasm of breast     Skin irritation     H/O bone density study     Vertigo     Orthostatic hypotension     Social problem     Right arm weakness     Insomnia      Past Surgical History:  Past Surgical History:  No date: ANES NERVE MUSC TENDON FASCIA&BURSA KNEE&/POPLT  No date: FOOT SURGERY      Comment: bilateral  No date: LAPAROSCOPY SURG CHOLECYSTECTOMY  No date: OB ANTEPARTUM CARE CESAREAN DLVR & POSTPARTUM      Comment: x3  No date: TONSILLECTOMY & ADENOIDECTOMY <AGE 9  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   Current Outpatient Prescriptions:  nicotine polacrilex (NICORETTE) 4 MG gum Take 1 each by mouth as needed for [place] (Nicotine) Disp: 100 each Rfl: 2   metoprolol (TOPROL-XL) 25 MG 24 hr tablet Take 0.5 tablets by mouth nightly At bedtime Disp: 15 tablet Rfl: 11   glucose blood (ONETOUCH VERIO) test strip by Finger Test or Other route daily Use as instructed Disp: 100 strip Rfl: 11   lisinopril (PRINIVIL,ZESTRIL) 20 MG tablet Take 1 tablet by mouth daily Disp: 90 tablet Rfl: 3    metFORMIN (GLUCOPHAGE-XR) 500 MG 24 hr tablet Take 4 tablets by mouth daily with breakfast Disp: 120 tablet Rfl: 11   Lancets Use 1 time daily.  Dispense lancets that are covered by insurance. Dx e11.29 Disp: 100 each Rfl: 11   atorvastatin (LIPITOR) 20 MG tablet Take 1 tablet by mouth daily Disp: 90 tablet Rfl: 3   albuterol (PROVENTIL HFA,VENTOLIN HFA, PROAIR HFA) 108 (90 BASE) MCG/ACT inhaler Inhale 2 puffs into the lungs every 6 (six) hours as needed for Wheezing or Shortness of breath Disp: 1 Inhaler Rfl: 11   Omega-3 Fatty Acids (FISH OIL) 1000 MG CAPS Take by mouth Disp:  Rfl:    cholecalciferol (VITAMIN D3) 1000 UNIT tablet Take 1 tablet by mouth daily Disp:  Rfl:    pregabalin (LYRICA) 100 MG capsule Take 100 mg by mouth 2 (two) times daily With Dr Dwyane Dee Disp:  Rfl:    fluticasone (FLONASE) 50 MCG/ACT nasal spray 1 spray by Each Nostril route daily. Disp:  Rfl:    oxycodone-acetaminophen (PERCOCET) 5-325 MG per tablet Take 1 tablet by mouth 2 (two) times daily as needed With Dr Dwyane Dee Disp:  Rfl:    aspirin 81 MG tablet Take 81 mg by mouth daily. Disp:  Rfl:        Social History:     Smoking status: Current Every Day Smoker  0.50 Packs/day  For 31.00 Years     Smokeless tobacco: Not on file    Comment: quit smoking inform provided to patient    Alcohol use No       Allergies:  Review of Patient's Allergies indicates:   Codeine camsylate       Shortness of Breath, Other (See  Comments)    Comment:Chest pain   Motrin [ibuprofen]      Nausea Only   Augmentin [amoxicil*    Nausea and Vomiting   Bactrim                 Rash, Itching    Physical Exam:   09/15/16  0936   BP: 121/84   Pulse: 110   Resp: 20   Temp: 98.8 F   SpO2: 99%   Weight: 99.8 kg (220 lb)       GEN: No acute distress, appears comfortable  HEENT: Atraumatic, normocephalic.  PERRL, EOMI.  Conjunctiva clear.    CARDIOVASC: Regular rate and rhythm, no murmurs, rubs, and gallops.    RESP: No respiratory distress, good  air exchange, breath sounds CTAB.  NEURO: Alert and oriented, CN III-XII grossly nl, MAEx4  PSYCH: Appropriate affect and thought  SKIN: Warm, dry, no rashes or bruising including over foot  R foot: mild ttp in midfoot without swelling/deformity/rash, nl sensation to light touch, 2+ dp pulse, no other leg/ankle ttp        ED Course and Medical Decision-making:    The patient is a 60 year old female with right foot pain starting suddenly while walking.  X-ray shows no fracture.  May be a mild sprain.  No evidence of infection and I do not think it would start quite so suddenly.  Did recommend following up with podiatry if not improving with RICE.  Ace wrap and post-op shoe provided, declined crutches.      Disposition:  Discharged    Condition: improved/stable    Diagnosis/Diagnoses:  Right foot sprain, initial encounter        Lendell Caprice, MD

## 2016-09-15 NOTE — Narrator Note (Signed)
Pt d/c home, d/c instructions given, to f/u with PCP.Pt verbalized understanding about discharge intructions.

## 2016-09-18 ENCOUNTER — Ambulatory Visit (HOSPITAL_BASED_OUTPATIENT_CLINIC_OR_DEPARTMENT_OTHER): Payer: MEDICARE | Admitting: Rehabilitative and Restorative Service Providers"

## 2016-09-18 DIAGNOSIS — R29898 Other symptoms and signs involving the musculoskeletal system: Secondary | ICD-10-CM

## 2016-09-18 NOTE — Progress Notes (Signed)
S:  Patient reports 5/10 R shoulder pain.  Pt has been compliant with HEP since last visit.      O: Refer to rehab tx flow sheet    A:  Patient responded fair to tx; good tolerance to manual cervical traction.  Pt required TC to engage scapula ms for good shoulder ROM.      P:  Follow POC & continue to progress as tolerated.        09/18/16 1300   Language Information   Language of Care English   Interpreter No   Rehab Discipline   Rehab Discipline PT   Visit   Visit number 4   POC Due date MCR guidelines   Time Calculation   Start Time 1330   Stop Time 1355   Time Calculation (min) 25 min   Pain   Pain Score 5    Manual Therapy   Manual Therapy Yes   Technique manual cervical traction   Ther Exercise   Therapeutic Exercise? Yes   Exercise seated low rows with RTB   Reps 10   Sets 2   Ther Exercise 4   Exercise 4 skull crusher with 1 & 2#   Reps 4 10   Sets 4 2   Ther Exercise 5   Exercise 5 seated shoulder ext RTB    Reps 5 10   Sets 5 2   Patient Education   What was taught? HEP added rows    Method Verbal;Demo;Practice;Written   Patient comprehension Yes

## 2016-09-21 ENCOUNTER — Ambulatory Visit (HOSPITAL_BASED_OUTPATIENT_CLINIC_OR_DEPARTMENT_OTHER): Payer: Self-pay | Admitting: Rehabilitative and Restorative Service Providers"

## 2016-09-25 ENCOUNTER — Ambulatory Visit (HOSPITAL_BASED_OUTPATIENT_CLINIC_OR_DEPARTMENT_OTHER): Payer: MEDICARE | Admitting: Rehabilitative and Restorative Service Providers"

## 2016-09-25 DIAGNOSIS — R29898 Other symptoms and signs involving the musculoskeletal system: Secondary | ICD-10-CM

## 2016-09-25 NOTE — Progress Notes (Signed)
S: Pt reports that her pain is 4-5 today had another episode of R UE numbness in am feels that it was related to sleeping on right side.  States she has been limited in tolerance to lifting and activity due to stenosis - left side has been the weakest side.  Pt did c/o right wrist pain     O: Refer to Rehabilitation Treatment Flowsheet   09/25/16 1400   Language Information   Language of Care English   Interpreter No   Spine/Thoracic precautions   Other (comments) cervical and lumbar stenosis   Rehab Discipline   Rehab Discipline PT   Visit   Visit number 5   POC Due date MCR guidelines   Time Calculation   Start Time 1406   Stop Time 1440   Time Calculation (min) 34 min   Pain   Pain Score 5   (reports pain is 4-5 on pain scale)   Manual Therapy   Technique manual cervical traction   Ther Exercise   Exercise seated shoulder flexion   Reps 10   Sets 3   Ther Exercise 2   Exercise forearm twists with yellow tube   Reps 2 10   Sets 2 2   Ther Exercise 3   Exercise 3 supine bar press up 5#   Reps 3 30   Sets 3 1   Ther Exercise 4   Exercise 4 supine shoulder flex with YTB loop stabilization   Reps 4 10   Sets 4 1   Ther Exercise 5   Exercise 5 sholder stabilization   Ther Exercise 6   Exercise 6 instruced in core strengthening program PPT and SL heel slide with TA stabilization   Patient Education   What was taught? HEP    Method Verbal;Demo;Practice;Written   Patient comprehension Yes       A: Pt tolerated treatment well deferred theraband exercises to minimize wrist pain with gripping - also reviewed neutral wrist position for weights and home program as tolerated.  Pt was instructed in abdominal strengthening exercises in supine position.    P: Continue as per Plan of Care    .

## 2016-09-27 ENCOUNTER — Ambulatory Visit (HOSPITAL_BASED_OUTPATIENT_CLINIC_OR_DEPARTMENT_OTHER): Payer: MEDICARE | Admitting: Rehabilitative and Restorative Service Providers"

## 2016-09-27 DIAGNOSIS — R29898 Other symptoms and signs involving the musculoskeletal system: Secondary | ICD-10-CM

## 2016-09-27 NOTE — Progress Notes (Signed)
S: Pt reports that her right arm pain is @ 4, does report continued right wrist pain - improving with use of wrist support    O: Refer to Rehabilitation Treatment Flowsheet   09/27/16 1200   Language Information   Language of Care English   Spine/Thoracic precautions   Other (comments) cervical and lumbar stenosis   Rehab Discipline   Rehab Discipline PT   Visit   Visit number 6   POC Due date MCR guidelines   Time Calculation   Start Time 1155   Stop Time 1225   Time Calculation (min) 30 min   Pain   Pain Score 4    Ther Exercise   Exercise stat bike x 5 min   Ther Exercise 2   Exercise S/L shoulder abd    Reps 2 10   Sets 2 3   Ther Exercise 3   Exercise 3 scapula clocks   Reps 3 10   Sets 3 1   Holds 3 10 sec   Ther Exercise 4   Exercise 4 standing Y lifts   Reps 4 10   Sets 4 1   Other   Miscellaneous trial of KT I strip for scapula retraction   Patient Education   What was taught? HEP   Method Verbal;Demo;Practice;Written   Patient comprehension Yes       A: Pt tolerated treatment well - advise to limit use of theraband for rows to minimize gripping which may be contributing to wrist pain.  Pt instructed in scapula clocks and standing Y lifts to continue with scapula strengthening ex and added trial of Kinesio taping for assisted scapula retraction      P: Continue as per Plan of Care    .

## 2016-09-30 ENCOUNTER — Other Ambulatory Visit (HOSPITAL_BASED_OUTPATIENT_CLINIC_OR_DEPARTMENT_OTHER): Payer: Self-pay | Admitting: Internal Medicine

## 2016-10-01 ENCOUNTER — Ambulatory Visit (HOSPITAL_BASED_OUTPATIENT_CLINIC_OR_DEPARTMENT_OTHER): Payer: MEDICARE | Admitting: Rehabilitative and Restorative Service Providers"

## 2016-10-01 DIAGNOSIS — R29898 Other symptoms and signs involving the musculoskeletal system: Secondary | ICD-10-CM

## 2016-10-01 NOTE — Progress Notes (Signed)
PER Pharmacy, Samantha Cameron is a 60 year old female has requested a refill of Lipitor 20 mg.      Last Office Visit: 08/06/16 with Dr. [name]  Last Physical Exam: 08/15/15      Other Med Adult:  Most Recent BP Reading(s)  09/15/16 : 121/84          Cholesterol (mg/dL)   Date Value   08/06/2016 143   ----------    LOW DENSITY LIPOPROTEIN DIRECT (mg/dL)   Date Value   08/06/2016 67   ----------    HIGH DENSITY LIPOPROTEIN (mg/dL)   Date Value   08/06/2016 44   ----------    TRIGLYCERIDES (mg/dl)   Date Value   02/08/2009 189 (H)   ----------        THYROID SCREEN TSH REFLEX FT4 (uIU/mL)   Date Value   04/12/2016 1.260   ----------      No results found for: TSH      HEMOGLOBIN A1C (%)   Date Value   08/06/2016 6.5 (H)   ----------        INR (no units)   Date Value   05/27/2016 1.0   02/01/2012 < 1.0 (L)   02/07/2009 1.0 (L)   ----------      SODIUM (mmol/L)   Date Value   05/28/2016 141   ----------      POTASSIUM (mmol/L)   Date Value   05/28/2016 4.2   ----------          CREATININE (mg/dL)   Date Value   05/28/2016 0.7   ----------      Documented patient preferred pharmacies:    [facility] - [address], [place]  Phone: [phone] Fax: [phone]

## 2016-10-01 NOTE — Progress Notes (Signed)
S: Pt reports her right UE pain is 8 today   States her wrist pain has flared up and she is using her brace.  States cold weather does contribute to increase in pain.  Pt also states she has not heard back from neurosurgery office re: EMG test.  States she will follow up with MD re: testing       Refer to Rehabilitation Treatment Flowsheet   10/01/16 1200   Language Information   Language of Care English   Interpreter No   Spine/Thoracic precautions   Other (comments) cervical and lumbar stenosis   Rehab Discipline   Rehab Discipline PT   Visit   Visit number -----   POC Due date MCR guidelines   Time Calculation   Start Time 1200   Stop Time 1225   Time Calculation (min) 25 min   Pain   Pain Score 8    Ther Exercise   Exercise verbal review of HEP   Patient Education   What was taught? HEP and follow up with MD   Method Verbal   Patient comprehension Yes       A: Pt with continued R UE weakness - increase in pain today  Discussed hold PT for follow up with neurosurgery at [facility] with last note on 9/5 - plan for EMG / NCS  Pt placed on hold for PT - will contact dept following MD follow up or for any questions with HEP    P: Pt placed on hold for PT wtll resume following neurosurgery follow up  .

## 2016-10-05 ENCOUNTER — Ambulatory Visit (HOSPITAL_BASED_OUTPATIENT_CLINIC_OR_DEPARTMENT_OTHER): Payer: Self-pay | Admitting: Rehabilitative and Restorative Service Providers"

## 2016-12-06 ENCOUNTER — Telehealth (HOSPITAL_BASED_OUTPATIENT_CLINIC_OR_DEPARTMENT_OTHER): Payer: Self-pay

## 2016-12-06 NOTE — Progress Notes (Signed)
Treater Name:  Hardy  Tracking #: [id number]    Member Name:  Samantha, Shortt Cameron  Status: Pending    RequestType:  Increase in Visits  Created: 12/06/2016 10:28:00 AM       Facility Location:  Member Pickup Address:    [facility]  [address]    [place], [place], [address]  [place], [place], [address]    [phone]  [place] within [place]:  - Yes   Requested Service:  - Pain Management   Duration of Services:  - 12 MONTHS   Frequency of Services:  - 5 visit(s) per WEEK   Wheelchair van needed:  - No   Escort accompanying the member:  - No   Service Animal needed: - No     .

## 2017-01-03 ENCOUNTER — Telehealth (HOSPITAL_BASED_OUTPATIENT_CLINIC_OR_DEPARTMENT_OTHER): Payer: Self-pay

## 2017-01-03 NOTE — Progress Notes (Signed)
Called and spoke with patient, she reports she has hx total knee replacement (left) 7 years ago, she had been doing well up until about 3 weeks ago, she started experiencing left knee pain again, no injury. Reports pain is worse with weight bearing, rest alleviates it, using icy hot and biofreeze as well as tylenol. Reports there is some swelling in knee, seems to be getting worse and now pain goes down her leg. No redness. Advised she be seen, appt scheduled tomorrow.

## 2017-01-03 NOTE — Telephone Encounter (Signed)
-----   Message from [facility] sent at 01/03/2017  3:18 PM EST -----  Regarding: pain on left knee and down leg   Contact: [place] [id number], 61 year old, female, Telephone Information:  Home Phone      [phone]  Work Phone      Not on file.  Mobile          [phone]      Patient's Preferred Pharmacy:     [facility] - [address], [place]  Phone: [phone] Fax: [phone]      CONFIRMED TODAY: Samantha Cameron NUMBER: [phone]      Patient's language of care: English    Patient does not need an interpreter.    Patient's PCP: Suann Larry. Simons,MD, MD    Person calling on behalf of patient: Patient (self)    Calls today Returning phone call: pt is calling states she is having a lot of pain in her left knee and pain is traveling down leg. Hurts when she walks also , would like to speak to a nurse or dr. [name] .

## 2017-01-04 ENCOUNTER — Encounter (HOSPITAL_BASED_OUTPATIENT_CLINIC_OR_DEPARTMENT_OTHER): Payer: Self-pay | Admitting: Internal Medicine

## 2017-01-04 ENCOUNTER — Ambulatory Visit (HOSPITAL_BASED_OUTPATIENT_CLINIC_OR_DEPARTMENT_OTHER): Payer: MEDICARE | Admitting: Internal Medicine

## 2017-01-04 VITALS — BP 124/62 | HR 116 | Temp 98.1°F | Wt 226.0 lb

## 2017-01-04 DIAGNOSIS — M25562 Pain in left knee: Secondary | ICD-10-CM

## 2017-01-04 DIAGNOSIS — R809 Proteinuria, unspecified: Secondary | ICD-10-CM

## 2017-01-04 DIAGNOSIS — E1129 Type 2 diabetes mellitus with other diabetic kidney complication: Secondary | ICD-10-CM

## 2017-01-04 DIAGNOSIS — F17200 Nicotine dependence, unspecified, uncomplicated: Secondary | ICD-10-CM

## 2017-01-04 DIAGNOSIS — Z96652 Presence of left artificial knee joint: Secondary | ICD-10-CM

## 2017-01-04 LAB — BASIC METABOLIC PANEL
ANION GAP: 7 mmol/L (ref 5–15)
BUN (UREA NITROGEN): 14 mg/dL (ref 7–18)
CALCIUM: 9.1 mg/dL (ref 8.5–10.1)
CARBON DIOXIDE: 28 mmol/L (ref 21–32)
CHLORIDE: 106 mmol/L (ref 98–107)
CREATININE: 0.6 mg/dL (ref 0.4–1.2)
ESTIMATED GLOMERULAR FILT RATE: >60 mL/min (ref >60)
Glucose Random: 88 mg/dL (ref 74–160)
POTASSIUM: 4.6 mmol/L (ref 3.5–5.1)
SODIUM: 141 mmol/L (ref 136–145)

## 2017-01-04 NOTE — Progress Notes (Signed)
Samantha Cameron is a 61 year old female here for L knee pain.    Patient Active Problem List:     Left genital labial abscess     Tobacco use disorder     Spinal stenosis of lumbar region     HLD (hyperlipidemia)     Chronic bilateral low back pain without sciatica     Skin change     Tachycardia     Pineal gland cyst     History of vitamin D deficiency     Hemorrhoids     Venous (peripheral) insufficiency     Adrenal adenoma     Type 2 diabetes mellitus with microalbuminuria, without long-term current use of insulin (HCC)     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 (HCC)     Acute nonintractable headache     Cough     Right ear impacted cerumen     Acute pain of right wrist     Abnormal PFTs     Screening for malignant neoplasm of breast     Skin irritation     H/O bone density study     Vertigo     Orthostatic hypotension     Social problem     Right arm weakness     Insomnia    Chronic L knee   Has not seen Dr. Dwyane Dee- pt reports he is in charge of the back pain.   Pt reports she received steroid injection on R knee from Dr. Dwyane Dee office but was told it can't be done for her L knee.   Note when she was mentioning the pain on her L knee and possible injection, she was advised to see an orthopedic doctor for her L knee.   Pt notes she has arthritis on L knee.   Pt reports current L knee pain started about 3 weeks ago  Notes the pain is "bad bad" and swelling.   Currently taking Percocet and Lyrica   Taking Tylenol - helps a bit   Applied ice on the area and elevated L leg after onset.   Pt reports the pain has been constant   Notes the pain is located on her whole front knee and you press to the L side, the pain worsens.    No fever   No recent injury     DM   Has been taking Metfromin regularly   HEMOGLOBIN A1C (%)   Date Value   08/06/2016 6.5 (H)   01/24/2016 6.1 (H)   07/15/2015 6.2 (H)     Smoking  Notes she stopped for while and picked it up again   Smokes about 6 cigarettes a day and will continue  to cut down     Most Recent BP Reading(s)  01/04/17 : 124/62  09/15/16 : 121/84  08/06/16 : 98/62  06/25/16 : 110/74  06/04/16 : 102/68    Notes it has been hard to exercise and but has been eating salads  Pt reports she does not "get around as much"     Most Recent Weight Reading(s)  01/04/17 : 102.5 kg (226 lb)  09/15/16 : 99.8 kg (220 lb)  08/06/16 : 99.8 kg (220 lb)  06/25/16 : 99.8 kg (220 lb)  06/04/16 : 101.2 kg (223 lb)      PE:  BP 124/62  Pulse 116  Temp 98.1 F (36.7 C) (Temporal)  Wt 102.5 kg (226 lb)  LMP 10/22/2007  SpO2 96%  BMI  42.95 kg/m2  Estimated body mass index is 42.95 kg/m as calculated from the following:    Height as of 08/15/15: 5' 0.83" (1.545 m).    Weight as of this encounter: 102.5 kg (226 lb).  Gen - Morbidly obese female upright in chair in NAD, ambulating with cane.   HEENT - MMM  Neuro - A+Ox3  Psych - Appropriate  L-knee- Healed L anterior knee scar, FROM, anterior TTP inferior to the patella. +Lateral joint line tenderness.   L foot -Normal sensation of the L foot to light touch. Normal sensation by monofilament, and slight deformity of the second toe.  R foot- Normal monofilament of the R foot     A/P:  (M25.562) Acute pain of left knee  (primary encounter diagnosis)  [id number]) Hx of total knee replacement, left  Comment: pain again of left knee, s/p knee replacement - refer to ortho  Plan: REFERRAL TO ORTHOPEDICS ( INT), COLLECTION         VENOUS BLOOD VENIPUNCTURE, HEMOGLOBIN A1C,         BASIC METABOLIC PANEL  Advised using heat pack or using icy/hot on the affected area.   Continue taking Tylenol for pain prn and to increase dose if pain persists.   If sx worsen or fail to improve, call the clinic.     (E11.29,  R80.9) Type 2 diabetes mellitus with microalbuminuria, without long-term current use of insulin (HCC)  Comment: check labs as above  Plan: Continue with current medication regimen, lifestyle change    (E66.01) 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  (HCC)  Comment: Discussed can contribute to pain, encouraged weight loss  Plan: Discussed portion control, healthy food choice, exercise    (F17.200) Tobacco use disorder  Comment: contemplative  Plan: Discussed cessation     By signing my name below, I, Lemar Lofty, attest that this documentation has been prepared under the direction and in the prescence of Osa Craver, M.D.  Electronically Signed:  Lemar Lofty, Medical Scribe. 01/04/17 1:53 PM    I, Osa Craver, personally performed the services described in this documentation. All medical record entries made by the scribe were at my direction and in my presence. I have reviewed the chart and discharge instructions (if applicable) and agree that the record reflects my personal performance and is accurate and complete.   Admir Candelas P. Floreen Teegarden,MD, 01/16/2017, 11:22 AM

## 2017-01-05 ENCOUNTER — Other Ambulatory Visit (HOSPITAL_BASED_OUTPATIENT_CLINIC_OR_DEPARTMENT_OTHER): Payer: Self-pay | Admitting: Internal Medicine

## 2017-01-05 DIAGNOSIS — E1129 Type 2 diabetes mellitus with other diabetic kidney complication: Secondary | ICD-10-CM

## 2017-01-05 DIAGNOSIS — R809 Proteinuria, unspecified: Principal | ICD-10-CM

## 2017-01-05 NOTE — Progress Notes (Signed)
PER Pharmacy, Samantha Cameron is a 61 year old female has requested a refill of METFORMIN ER 500MG .      Last [facility] Office Visit: 01/04/2017  Last Physical Exam: 08/15/2015      Other Med Adult:  Most Recent BP Reading(s)  01/04/17 : 124/62          Cholesterol (mg/dL)   Date Value   08/06/2016 143   ----------    LOW DENSITY LIPOPROTEIN DIRECT (mg/dL)   Date Value   08/06/2016 67   ----------    HIGH DENSITY LIPOPROTEIN (mg/dL)   Date Value   08/06/2016 44   ----------    TRIGLYCERIDES (mg/dl)   Date Value   02/08/2009 189 (H)   ----------        THYROID SCREEN TSH REFLEX FT4 (uIU/mL)   Date Value   04/12/2016 1.260   ----------      No results found for: TSH      HEMOGLOBIN A1C (%)   Date Value   08/06/2016 6.5 (H)   ----------    No results found for: POCA1C        INR (no units)   Date Value   05/27/2016 1.0   02/01/2012 < 1.0 (L)   02/07/2009 1.0 (L)   ----------      SODIUM (mmol/L)   Date Value   01/04/2017 141   ----------      POTASSIUM (mmol/L)   Date Value   01/04/2017 4.6   ----------          CREATININE (mg/dL)   Date Value   01/04/2017 0.6   ----------      Documented patient preferred pharmacies:    [facility] - [place], [place]  Phone: [phone] Fax: [phone]

## 2017-01-06 LAB — HEMOGLOBIN A1C
ESTIMATED AVERAGE GLUCOSE: 128 (ref 74–160)
HEMOGLOBIN A1C: 6.1 % — ABNORMAL HIGH (ref 4.0–5.6)

## 2017-01-09 ENCOUNTER — Other Ambulatory Visit (HOSPITAL_BASED_OUTPATIENT_CLINIC_OR_DEPARTMENT_OTHER): Payer: Self-pay | Admitting: Registered Nurse

## 2017-01-09 DIAGNOSIS — M25562 Pain in left knee: Secondary | ICD-10-CM

## 2017-01-10 ENCOUNTER — Ambulatory Visit (HOSPITAL_BASED_OUTPATIENT_CLINIC_OR_DEPARTMENT_OTHER): Payer: MEDICARE | Admitting: Orthopaedic Surgery

## 2017-01-11 ENCOUNTER — Telehealth (HOSPITAL_BASED_OUTPATIENT_CLINIC_OR_DEPARTMENT_OTHER): Payer: Self-pay

## 2017-01-11 NOTE — Progress Notes (Signed)
[facility] Name:  [facility]  Tracking #: [id number]    Member Name:  Samantha Cameron I  Status: Pending    RequestType:  New Form  Created: 01/11/2017 3:22:00 PM       Facility Location:  Member Pickup Address:    [address]  [address] [place], [place], [address]  [place], [place], [address]    [phone]  [place] within [place]:  - Yes   Requested Service:  - Orthopedic   Duration of Services:  - 12 MONTHS   Frequency of Services:  - 3 visit(s) per MONTH   Wheelchair van needed:  - No   Escort accompanying the member:  - Yes   Service Animal needed: - No     .

## 2017-01-14 ENCOUNTER — Ambulatory Visit (HOSPITAL_BASED_OUTPATIENT_CLINIC_OR_DEPARTMENT_OTHER): Payer: MEDICARE | Admitting: Orthopaedic Surgery

## 2017-01-16 DIAGNOSIS — M25562 Pain in left knee: Principal | ICD-10-CM

## 2017-01-16 DIAGNOSIS — Z96652 Presence of left artificial knee joint: Secondary | ICD-10-CM | POA: Insufficient documentation

## 2017-01-18 ENCOUNTER — Other Ambulatory Visit (HOSPITAL_BASED_OUTPATIENT_CLINIC_OR_DEPARTMENT_OTHER): Payer: Self-pay | Admitting: Internal Medicine

## 2017-01-18 DIAGNOSIS — R809 Proteinuria, unspecified: Principal | ICD-10-CM

## 2017-01-18 DIAGNOSIS — E1129 Type 2 diabetes mellitus with other diabetic kidney complication: Secondary | ICD-10-CM

## 2017-01-18 MED ORDER — LANCETS: each | 11 refills | 0 days | Status: AC

## 2017-01-18 MED ORDER — LANCETS
11 refills | Status: DC
Start: 2017-01-18 — End: 2018-12-01

## 2017-01-18 NOTE — Progress Notes (Signed)
PER Patient (self), Samantha Cameron is a 61 year old female has requested a refill of lancets.      Last Office Visit: 01/04/2017 with simons, christopher  Last Physical Exam: 08/15/2015      Other Med Adult:  Most Recent BP Reading(s)  01/04/17 : 124/62          Cholesterol (mg/dL)   Date Value   08/06/2016 143   ----------    LOW DENSITY LIPOPROTEIN DIRECT (mg/dL)   Date Value   08/06/2016 67   ----------    HIGH DENSITY LIPOPROTEIN (mg/dL)   Date Value   08/06/2016 44   ----------    TRIGLYCERIDES (mg/dl)   Date Value   02/08/2009 189 (H)   ----------        THYROID SCREEN TSH REFLEX FT4 (uIU/mL)   Date Value   04/12/2016 1.260   ----------      No results found for: TSH      HEMOGLOBIN A1C (%)   Date Value   01/04/2017 6.1 (H)   ----------    No results found for: POCA1C        INR (no units)   Date Value   05/27/2016 1.0   02/01/2012 < 1.0 (L)   02/07/2009 1.0 (L)   ----------      SODIUM (mmol/L)   Date Value   01/04/2017 141   ----------      POTASSIUM (mmol/L)   Date Value   01/04/2017 4.6   ----------          CREATININE (mg/dL)   Date Value   01/04/2017 0.6   ----------      Documented patient preferred pharmacies:    [facility] - [place], [place]  Phone: [phone] Fax: [phone]

## 2017-01-21 ENCOUNTER — Telehealth (HOSPITAL_BASED_OUTPATIENT_CLINIC_OR_DEPARTMENT_OTHER): Payer: Self-pay | Admitting: Internal Medicine

## 2017-01-21 NOTE — Progress Notes (Signed)
Hi Dr. [name],    Please complete, sign, and fax DWO for lancets to [facility]. Thank you

## 2017-01-22 NOTE — Progress Notes (Signed)
Hi Dr. [name],     DWO is a detailed written order form. I had scanned the form into [facility].

## 2017-01-22 NOTE — Progress Notes (Signed)
Will need to complete when back in office at [facility] Wednesday   Keanthony Poole P. Adaline Trejos,MD, 01/22/2017, 5:13 PM

## 2017-01-23 NOTE — Progress Notes (Signed)
Form completed - will be faxed and scanned  Chino Sardo P. Simara Rhyner,MD, 01/23/2017, 8:43 AM

## 2017-02-13 ENCOUNTER — Encounter (HOSPITAL_BASED_OUTPATIENT_CLINIC_OR_DEPARTMENT_OTHER): Payer: Self-pay | Admitting: Internal Medicine

## 2017-02-13 ENCOUNTER — Ambulatory Visit (HOSPITAL_BASED_OUTPATIENT_CLINIC_OR_DEPARTMENT_OTHER): Payer: MEDICARE | Admitting: Internal Medicine

## 2017-02-13 VITALS — BP 114/64 | HR 119 | Temp 97.1°F | Wt 226.0 lb

## 2017-02-13 DIAGNOSIS — H9202 Otalgia, left ear: Secondary | ICD-10-CM

## 2017-02-13 DIAGNOSIS — R809 Proteinuria, unspecified: Secondary | ICD-10-CM

## 2017-02-13 DIAGNOSIS — F17200 Nicotine dependence, unspecified, uncomplicated: Secondary | ICD-10-CM

## 2017-02-13 DIAGNOSIS — J4489 Other specified chronic obstructive pulmonary disease: Secondary | ICD-10-CM

## 2017-02-13 DIAGNOSIS — J019 Acute sinusitis, unspecified: Secondary | ICD-10-CM

## 2017-02-13 DIAGNOSIS — B9689 Other specified bacterial agents as the cause of diseases classified elsewhere: Secondary | ICD-10-CM

## 2017-02-13 DIAGNOSIS — E1129 Type 2 diabetes mellitus with other diabetic kidney complication: Secondary | ICD-10-CM

## 2017-02-13 DIAGNOSIS — J449 Chronic obstructive pulmonary disease, unspecified: Secondary | ICD-10-CM

## 2017-02-13 MED ORDER — FLUTICASONE PROPIONATE 50 MCG/ACT NA SUSP
1.0000 | Freq: Every day | NASAL | 11 refills | Status: DC
Start: 2017-02-13 — End: 2018-05-14

## 2017-02-13 MED ORDER — LEVOFLOXACIN 500 MG PO TABS: 500 mg | tablet | Freq: Every day | ORAL | 0 refills | 0 days | Status: AC

## 2017-02-13 MED ORDER — ALBUTEROL SULFATE HFA 108 (90 BASE) MCG/ACT IN AERS: 2 | Inhaler | Freq: Four times a day (QID) | RESPIRATORY_TRACT | 11 refills | 0 days | Status: AC | PRN

## 2017-02-13 MED ORDER — LEVOFLOXACIN 500 MG PO TABS
500.00 mg | ORAL_TABLET | Freq: Every day | ORAL | 0 refills | Status: AC
Start: 2017-02-13 — End: 2017-02-23

## 2017-02-13 MED ORDER — FLUTICASONE PROPIONATE 50 MCG/ACT NA SUSP: 1 | Bottle | Freq: Every day | NASAL | 11 refills | 0 days | Status: DC

## 2017-02-13 MED ORDER — ALBUTEROL SULFATE HFA 108 (90 BASE) MCG/ACT IN AERS
2.0000 | INHALATION_SPRAY | Freq: Four times a day (QID) | RESPIRATORY_TRACT | 11 refills | Status: AC | PRN
Start: 2017-02-13 — End: 2019-01-15

## 2017-02-13 NOTE — Progress Notes (Signed)
Samantha Cameron is a 61 year old female here for HA and earache.     Patient Active Problem List:     Tobacco use disorder     Spinal stenosis of lumbar region     HLD (hyperlipidemia)     Chronic bilateral low back pain without sciatica     Skin change     Tachycardia     Pineal gland cyst     History of vitamin D deficiency     Hemorrhoids     Venous (peripheral) insufficiency     Adrenal adenoma     Type 2 diabetes mellitus with microalbuminuria, without long-term current use of insulin (HCC)     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 (HCC)     Acute nonintractable headache     Cough     Right ear impacted cerumen     Acute pain of right wrist     Abnormal PFTs     Screening for malignant neoplasm of breast     Skin irritation     H/O bone density study     Vertigo     Orthostatic hypotension     Social problem     Right arm weakness     Insomnia     Acute pain of left knee     Hx of total knee replacement, left    Pt reports HA has been happening for the past 2 weeks.   Notes L ear ache started last night   No drainage from L ear   has not measured her temperature   No rhinorrhea  Some facial pain  No sick contacts   Pt notes she had a cough for one day and has gotten better shortly after   Reports she had trouble breathing but no chest pain.   Has had sinus infection before - uncertain if current sx are similar to previous sx.   No antibiotics in the past months.   Notes she has very drinking water - drinks less than 70-8 glasses of water .     DM   Currently taking Metformin   Upcoming appt with ophthalmologist on 10/04 - Dr. Lonia Chimera   HEMOGLOBIN A1C (%)   Date Value   01/04/2017 6.1 (H)   08/06/2016 6.5 (H)   01/24/2016 6.1 (H)   ----------  No results found for: POCA1C    Most Recent BP Reading(s)  02/13/17 : 114/64  01/04/17 : 124/62  09/15/16 : 121/84  08/06/16 : 98/62  06/25/16 : 110/74    Notes she has been working on it     Most Recent Weight Reading(s)  02/13/17 : 102.5 kg (226  lb)  01/04/17 : 102.5 kg (226 lb)  09/15/16 : 99.8 kg (220 lb)  08/06/16 : 99.8 kg (220 lb)  06/25/16 : 99.8 kg (220 lb)    Smoking   Notes smoking has been going well     COPD   Notes she needs a refill on Albuterol     Chronic L knee   Pt reports she scheduled an appt with orthopedic Dr. Kennyth Lose on 04/19 at [facility] since "knee started acting up again"     PE:  BP 114/64  Pulse 119  Temp 97.1 F (36.2 C) (Temporal)  Wt 102.5 kg (226 lb)  LMP 10/22/2007  SpO2 97%  BMI 42.95 kg/m2  Estimated body mass index is 42.95 kg/m as calculated from the following:    Height as of  08/15/15: 5' 0.83" (1.545 m).    Weight as of this encounter: 102.5 kg (226 lb).  Gen - Morbidly obese middle aged female upright in chair in NAD, wearing glasses.   HEENT - No swollen lymph nodes. No edema, erythema or tonsillar exudates. Positive L maxillary TTP. Ear canal and TM normal bilaterally  CV - RRR, no m/r/g  Resp - CTAB  Neuro - A+Ox3  Psych - Appropriate    A/P:  (J01.90,  B96.89) Acute bacterial sinusitis  (primary encounter diagnosis)  (H92.02) Left ear pain  Comment: Likely sinus infection with some ear eustachian tube dysfunction.  Given allergies - use levaquin.  Supportive care as well  Plan: levofloxacin (LEVAQUIN) 500 MG tablet,         fluticasone (FLONASE) 50 MCG/ACT nasal spray  Discussed taking Levaquin, one tablet daily for 10 days and encouraged to follow up with the 10 days regimen.   Possible side effects discussed with the pt while on Levaquin and if become bothersome, call the clinic   Advised to perform nasal saline rinses and salt water gargles.   Advised using Flonase one spray in each nostril daily and hot packs for the face prn.   Discussed supportive care with rest, hydration, tea with honey, humidified air.  Advised to call if has fever, SOB, CP, worsening symptoms or persistent symptoms after 1-2 weeks.    Encouraged pt to call clinic if sx doe not improve while on Levaquin by Friday.     (E11.29,   R80.9) Type 2 diabetes mellitus with microalbuminuria, without long-term current use of insulin ([place])  Comment: Controlled at last checj  Plan: Continue with current medication, lifestyle change     (E66.01) 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 (HCC)  Comment: By BMI  Plan: Discussed portion control, healthy food choice, exercise    (F17.200) Tobacco use disorder  Comment: contemplative  Plan: Discussed cessation.     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  Electronically Signed:  Lemar Lofty, Medical Scribe. 02/13/17 4:33 PM    I, Osa Craver, personally performed the services described in this documentation. All medical record entries made by the scribe were at my direction and in my presence. I have reviewed the chart and discharge instructions (if applicable) and agree that the record reflects my personal performance and is accurate and complete.   Kae Lauman P. Curvin Hunger,MD, 02/16/2017, 6:12 PM

## 2017-02-16 DIAGNOSIS — H9202 Otalgia, left ear: Secondary | ICD-10-CM

## 2017-02-16 DIAGNOSIS — J019 Acute sinusitis, unspecified: Principal | ICD-10-CM

## 2017-02-16 DIAGNOSIS — B9689 Other specified bacterial agents as the cause of diseases classified elsewhere: Secondary | ICD-10-CM

## 2017-02-16 HISTORY — DX: Other specified bacterial agents as the cause of diseases classified elsewhere: B96.89

## 2017-02-16 HISTORY — DX: Otalgia, left ear: H92.02

## 2017-02-21 ENCOUNTER — Other Ambulatory Visit (HOSPITAL_BASED_OUTPATIENT_CLINIC_OR_DEPARTMENT_OTHER): Payer: Self-pay | Admitting: Internal Medicine

## 2017-02-21 MED ORDER — LISINOPRIL 20 MG PO TABS: 20 mg | tablet | Freq: Every day | ORAL | 3 refills | 0 days | Status: DC

## 2017-02-21 MED ORDER — LISINOPRIL 20 MG PO TABS
20.0000 mg | ORAL_TABLET | Freq: Every day | ORAL | 3 refills | Status: DC
Start: 2017-02-21 — End: 2018-03-08

## 2017-02-21 NOTE — Progress Notes (Signed)
PER Patient (self), Samantha Cameron is a 60 year old female has requested a refill of Lisinopril 20mg .    Patient just picked up last refill from [facility] and is requesting for provider to send in new prescription for next fill.     Last Office Visit: 02/13/17  Last Physical Exam: 08/15/15      HTN Med:    Most Recent BP Reading(s)  02/13/17 : 114/64  01/04/17 : 124/62  09/15/16 : 121/84      Documented patient preferred pharmacies:    [facility] - [place], [place]  Phone: [phone] Fax: [phone]

## 2017-03-07 ENCOUNTER — Ambulatory Visit (HOSPITAL_BASED_OUTPATIENT_CLINIC_OR_DEPARTMENT_OTHER): Payer: MEDICARE | Admitting: Orthopaedic Surgery

## 2017-03-14 ENCOUNTER — Ambulatory Visit (HOSPITAL_BASED_OUTPATIENT_CLINIC_OR_DEPARTMENT_OTHER): Payer: MEDICARE | Admitting: Orthopaedic Surgery

## 2017-03-14 VITALS — BP 120/78 | HR 92 | Ht 62.0 in | Wt 225.0 lb

## 2017-03-14 DIAGNOSIS — M25562 Pain in left knee: Secondary | ICD-10-CM

## 2017-03-14 DIAGNOSIS — M1711 Unilateral primary osteoarthritis, right knee: Secondary | ICD-10-CM

## 2017-03-14 DIAGNOSIS — Z96652 Presence of left artificial knee joint: Secondary | ICD-10-CM

## 2017-03-14 LAB — XR KNEE LEFT MINIMUM 4 VIEWS

## 2017-03-14 LAB — XR KNEES STANDING AP ONLY BILATERAL

## 2017-03-14 MED ORDER — MELOXICAM 7.5 MG PO TABS
7.5000 mg | ORAL_TABLET | Freq: Every day | ORAL | 2 refills | Status: DC
Start: 2017-03-14 — End: 2017-04-19

## 2017-03-14 MED ORDER — MELOXICAM 7.5 MG PO TABS: 8 mg | tablet | Freq: Every day | ORAL | 2 refills | 0 days | Status: DC

## 2017-03-14 NOTE — Progress Notes (Signed)
Orthopedic Office Note    CC: Patient presents with:  Knee Pain: bilateral knee      ORTHOPEDIC PROBLEM LIST:  1.  Status post left TKA 6-7 years ago Dr. Shirlean Mylar [place]  2.  Right knee osteoarthritis    HPI: Samantha Cameron is a 61 year old female who presents for initial evaluation of bilateral knee pain.  Patient reports having left TKA 6 7 years ago with Dr. Shirlean Mylar at [facility].  She states she did well after surgery however began having pain in March 2018.  She denies any injury.  She states she suddenly could not walk.  She describes pain along the medial and lateral aspect of the joint.  She also complains of right knee pain.  She rates her pain 6/10.  She states she has received Synvisc injections to the right knee in March at [facility].  She states they also treat her back there.  She reports her right knee gave out on her 3 weeks ago.. Denies any numbness or tingling distally.    ROS: Review of systems filled out by patient and scanned into the medical record. Reviewed by me and all other systems are negative except as noted in HPI.    PMH: Past Medical History:  No date: 1st MTP arthritis      Comment: per [facility] records, xray 11/2011  No date: Arthritis  No date: Back pain  No date: Bilateral knee pain      Comment: per steward records, mri - see scanned - tear                meniscus right, politeal cyst, mcl sprain                06/2014, s/p left knee replacement. right                tricompartment arthritis esp medial femoral                tibial  No date: Cerumen impaction      Comment: per steward records  02/01/2012: Chest pain  No date: Chronic bronchitis  02/01/2012: Chronic bronchitis with COPD (chronic obstruct*  No date: Depression      Comment: per steward records  No date: Diabetes  No date: Disorders of lipoid metabolism  No date: Esophageal reflux  No date: External hemorrhoid      Comment: per steward records  No date: HTN (hypertension)  02/01/2012:  Hypoxia  06/07/2015: Left genital labial abscess  06/16/2015: Obesity, Class III, 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*  08/16/2015: Osteopenia      Comment: On xray of left knee per steward records                06/2014 Unclear if addressed  08/15/2015: Type 2 diabetes mellitus without complication,*      Comment:  HEMOGLOBIN A1C (%) Date Value 07/15/2015 6.2                (H) ----------  Steward record - a1c 6.2 on                01/31/15 6.4 on 09/30/14 5.9 on 12/29/13 6.4                08/18/13     Surgical HX: Past Surgical History:  No date: ANES NERVE MUSC TENDON FASCIA&BURSA KNEE&/POPLT  No date: FOOT SURGERY      Comment: bilateral  No date: LAPAROSCOPY SURG  CHOLECYSTECTOMY  No date: OB ANTEPARTUM CARE CESAREAN DLVR & POSTPARTUM      Comment: x3  No date: TONSILLECTOMY & ADENOIDECTOMY <AGE 68  04/13/2009: TOTAL KNEE REPLACEMENT      Comment: left  08/11/2009: WRIST GANGLION EXCISION      Comment: left     SH:   Social History  Social History   Marital status: Divorced  Spouse name: N/A    Years of education: N/A  Number of children: N/A     Occupational History  None on file     Social History Main Topics   Smoking status: Current Every Day Smoker  0.50 Packs/day  For 31.00 Years     Smokeless tobacco: Never Used    Comment: quit smoking inform provided to patient    Alcohol use No    Drug use: No    Sexual activity: Not Currently    Partners: Male    Birth control/ protection: Tubal Ligation     Other Topics Concern   None on file     Social History Narrative    Lives with son    1 daughter and another son passed away    Disabled from back    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 Simons,MD, 07/15/2015, 2:56 PM           Allergies: Review of Patient's Allergies indicates:   Codeine camsylate       Shortness of  Breath, Other (See                            Comments)    Comment:Chest pain   Motrin [ibuprofen]      Nausea Only   Augmentin [amoxicil*    Nausea and Vomiting   Bactrim                 Rash, Itching    Current Medications:   Current Outpatient Prescriptions:     lisinopril (PRINIVIL,ZESTRIL) 20 MG tablet, Take 1 tablet by mouth daily, Disp: 90 tablet, Rfl: 3    fluticasone (FLONASE) 50 MCG/ACT nasal spray, 1 spray by Each Nostril route daily, Disp: 1 Bottle, Rfl: 11    albuterol (PROVENTIL HFA,VENTOLIN HFA, PROAIR HFA) 108 (90 Base) MCG/ACT inhaler, Inhale 2 puffs into the lungs every 6 (six) hours as needed for Wheezing or Shortness of breath, Disp: 1 Inhaler, Rfl: 11    Lancets, Use 1 time daily.  Dispense lancets that are covered by insurance. Dx e11.29, Disp: 100 each, Rfl: 11    metFORMIN (GLUCOPHAGE-XR) 500 MG 24 hr tablet, Take 4 tablets by mouth daily with breakfast, Disp: 120 tablet, Rfl: 11    atorvastatin (LIPITOR) 20 MG tablet, take 1 tablet by mouth once daily, Disp: 90 tablet, Rfl: 3    metoprolol (TOPROL-XL) 25 MG 24 hr tablet, Take 0.5 tablets by mouth nightly At bedtime, Disp: 15 tablet, Rfl: 11    glucose blood (ONETOUCH VERIO) test strip, by Finger Test or Other route daily Use as instructed, Disp: 100 strip, Rfl: 11    Omega-3 Fatty Acids (FISH OIL) 1000 MG CAPS, Take by mouth, Disp: ,  Rfl:     cholecalciferol (VITAMIN D3) 1000 UNIT tablet, Take 1 tablet by mouth daily, Disp: , Rfl:     pregabalin (LYRICA) 100 MG capsule, Take 100 mg by mouth 2 (two) times daily With Dr Dwyane Dee, Disp: , Rfl:     oxycodone-acetaminophen (PERCOCET) 5-325 MG per tablet, Take 1 tablet by mouth 2 (two) times daily as needed With Dr Dwyane Dee, Disp: , Rfl:     aspirin 81 MG tablet, Take 81 mg by mouth daily., Disp: , Rfl:     IMAGING: x-ray of the left knee reveals total knee cruciate retaining prosthesis in good alignment without evidence of hardware loosening or complication.  Right knee shows arthritis  with joint line n medial compartment narrowing.  Imaging was reviewed by myself and Dr. Kennyth Lose during this visit.     PHYSICAL EXAM:  GENERAL: Alert and oriented, in no acute distress  MOOD: Appropriate.   MUSCULOSKELETAL: Evaluation of bilateral knees reveals no gross bony deformities.  Anterior incision to the left knee is well-healed.  Her skin is warm, dry and intact. There is no erythema, edema, ecchymosis bilaterally.  She has no effusion bilaterally.  She is tender to palpation along the medial and lateral aspect of the left knee and around the peripatellar region and medial aspect of the right knee. Ligaments are stable to varus and valgus stress.  ACL and PCL are stable on exam bilateral knees.  ROM 0-110 degrees. She is neurovascularly intact distally. +2 pedal pulses    ASSESSMENT/PLAN: 61 year old female status post left TKA 6-7 years ago, on room.  She began having pain in the left knee suddenly in March 2018.  This is most likely due to synovitis.  She also has osteoarthritis of the right knee.  Recommended starting anti-inflammatory.  She is prescribed Mobic 7.5 mg to take daily with food for the pain and inflammation.  Offered a brace for the right knee.  We do not have one large now she will go to [facility] to get fitted for the hinged knee brace.  Offered follow-up however she prefers to follow-up on an as-needed basis if her symptoms worsen or do not improve.    Over 30 minutes was spent with the patient during the encounter, with more than 50% of time spent on counseling regarding likely diagnosis, the prognosis, and various treatment options in detail. Risks and benefits of treatment plan discussed. The patient's questions have been answered, and the patient understands agrees with treatment plan.     Dr. Kennyth Lose saw and examined the patient and formulated the assessment and plan.    This note was prepared using voice recognition software. Please disregard any transcription errors.    Rosario Adie, PA-C, 03/14/2017, 2:52 PM      Nursing Communication:    ___ Mena Goes:   ___ XOA in splint/cast  ___ XOA out of splint/cast  ___ Cast removal   ___ MRI/CT review  ___ Surgical booking (vitals)  _x__ None

## 2017-03-15 NOTE — Progress Notes (Signed)
Pt was sent to pick up a J Hinged Knee brace at [facility] clinic. We don't have XX Lg in stock. Pt agree to plan.

## 2017-04-10 ENCOUNTER — Encounter (HOSPITAL_BASED_OUTPATIENT_CLINIC_OR_DEPARTMENT_OTHER): Payer: Self-pay | Admitting: Internal Medicine

## 2017-04-10 ENCOUNTER — Ambulatory Visit (HOSPITAL_BASED_OUTPATIENT_CLINIC_OR_DEPARTMENT_OTHER): Payer: MEDICARE | Admitting: Internal Medicine

## 2017-04-10 VITALS — BP 102/60 | HR 111 | Temp 96.9°F | Wt 225.0 lb

## 2017-04-10 DIAGNOSIS — F17200 Nicotine dependence, unspecified, uncomplicated: Secondary | ICD-10-CM

## 2017-04-10 DIAGNOSIS — Z638 Other specified problems related to primary support group: Secondary | ICD-10-CM

## 2017-04-10 DIAGNOSIS — R29898 Other symptoms and signs involving the musculoskeletal system: Secondary | ICD-10-CM

## 2017-04-10 DIAGNOSIS — R000 Tachycardia, unspecified: Secondary | ICD-10-CM

## 2017-04-10 DIAGNOSIS — M79672 Pain in left foot: Secondary | ICD-10-CM

## 2017-04-10 DIAGNOSIS — R809 Proteinuria, unspecified: Secondary | ICD-10-CM

## 2017-04-10 DIAGNOSIS — J302 Other seasonal allergic rhinitis: Secondary | ICD-10-CM

## 2017-04-10 DIAGNOSIS — E1129 Type 2 diabetes mellitus with other diabetic kidney complication: Secondary | ICD-10-CM

## 2017-04-10 LAB — BASIC METABOLIC PANEL
ANION GAP: 6 mmol/L (ref 5–15)
BUN (UREA NITROGEN): 14 mg/dL (ref 7–18)
CALCIUM: 9.7 mg/dL (ref 8.5–10.1)
CARBON DIOXIDE: 26 mmol/L (ref 21–32)
CHLORIDE: 106 mmol/L (ref 98–107)
CREATININE: 0.6 mg/dL (ref 0.4–1.2)
ESTIMATED GLOMERULAR FILT RATE: >60 mL/min (ref >60)
Glucose Random: 95 mg/dL (ref 74–160)
POTASSIUM: 4.6 mmol/L (ref 3.5–5.1)
SODIUM: 138 mmol/L (ref 136–145)

## 2017-04-10 LAB — BLOOD SUGAR FINGERSTICK (POINT OF CARE): FINGERSTICK GLUCOSE: 101 mg/dl (ref 74–160)

## 2017-04-10 MED ORDER — LORATADINE 10 MG PO TABS
10.00 mg | ORAL_TABLET | Freq: Every day | ORAL | 1 refills | Status: AC
Start: 2017-04-10 — End: 2017-06-09

## 2017-04-10 MED ORDER — LORATADINE 10 MG PO TABS: 10 mg | tablet | Freq: Every day | ORAL | 1 refills | 0 days | Status: AC

## 2017-04-10 NOTE — Progress Notes (Signed)
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) ([place])     Tobacco use disorder     Spinal stenosis of lumbar region     HLD (hyperlipidemia)     Chronic bilateral low back pain without sciatica     Skin change     Tachycardia     Pineal gland cyst     History of vitamin D deficiency     Hemorrhoids     Venous (peripheral) insufficiency     Adrenal adenoma     Type 2 diabetes mellitus with microalbuminuria, without long-term current use of insulin ([place])     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 (HCC)     Acute nonintractable headache     Cough     Right ear impacted cerumen     Acute pain of right wrist     Abnormal PFTs     Screening for malignant neoplasm of breast     Skin irritation     H/O bone density study     Vertigo     Orthostatic hypotension     Social problem     Right arm weakness     Insomnia     Acute pain of left knee     Hx of total knee replacement, left     Acute bacterial sinusitis     Left ear pain      DM  Random fingerstick in the office- 101  Pt reports AM glucose levels would range between 93-116   Currently taking Metformin 500 bid   Pt notes she has been monitoring her diet and been walking more since she received her R knee brace   No numbness or tingling   Upcoming ophthalmologist appt on 08/22/17  HEMOGLOBIN A1C (%)   Date Value   01/04/2017 6.1 (H)   08/06/2016 6.5 (H)   01/24/2016 6.1 (H)   ----------    No results found for: POCA1C    Most Recent BP Reading(s)  04/10/17 : 102/60  03/14/17 : 120/78  02/13/17 : 114/64  01/04/17 : 124/62  09/15/16 : 121/84    Most Recent Weight Reading(s)  04/10/17 : 102.1 kg (225 lb)  03/14/17 : 102.1 kg (225 lb)  02/13/17 : 102.5 kg (226 lb)  01/04/17 : 102.5 kg (226 lb)  09/15/16 : 99.8 kg (220 lb)    Tobacco use   Notes she is down to 5 cigarettes a day   Pt reports she is planning on quitting   Notes she has quit in the past  "on my own" - cold [place]   Has tried Chantix in the past     Seasonal allergies   Notes he completed Levaquin regimen and has been using Flonase daily   Pt reports nasal infection improved but sneezing is still present   Does not remember taking Claritin - was prescribed in 2016     L foot pain   Pt notes she has been having pain on L foot   Pt reports she was previously prescribed diabetic shoes  Met last year with podiatrist   Xray completed and arthritis noted on L foot     Tachycardia/ Family stress  Notes she has been having some stress in regards to her grandchildren   Mother relapsed and been into drugs and alcohol use.  Pt  reports current grandchildren were removed from their mother and are with DCF foster parents as of now     R arm weakness   Notes she was unable to follow up with neurosurgery department at [facility] - was scheduled an appt on 08/2016 for EMG nerve conduction   Reports she was not reached out about any scheduled appt - does not have their contact information to call and reschedule   Pt reports her arm is stil heavy "but not as heavy"    PE:  BP 102/60  Pulse 111  Temp 96.9 F (36.1 C) (Temporal)  Wt 102.1 kg (225 lb)  LMP 10/22/2007  SpO2 97%  BMI 41.15 kg/m2  Estimated body mass index is 41.15 kg/m as calculated from the following:    Height as of 03/14/17: 5' 2"  (1.575 m).    Weight as of this encounter: 102.1 kg (225 lb).  Gen - Morbidly obese middle aged female upright in chair in NAD, wearing glasses.   HEENT - MMM  CV - tachycardic, regular rhythm, no m/r/g  Resp - CTAB  Neuro - A+Ox3  Psych - Appropriate    A/P:  (E11.29,  R80.9) Type 2 diabetes mellitus with microalbuminuria, without long-term current use of insulin (HCC)  Comment: Controlled at last check  Plan: COLLECTION VENOUS BLOOD VENIPUNCTURE,         HEMOGLOBIN F7C, BASIC METABOLIC PANEL  Continue with current medication regimen   Follow up with ophthalmologist appt on 08/22/17     (E66.01) Morbid obesity with body mass index of  40.0-49.9 (HCC)  Comment: By BMI  Plan: Discussed portion control, healthy food choice, exercise    (F17.200) Tobacco use disorder  Comment: Cutting down, trying to quit  Plan: Discussed and encouraged pt to quit.     (J30.2) Seasonal allergic rhinitis, unspecified chronicity, unspecified trigger  Comment: oral antihistamine and steroid nasal spray  Plan: loratadine (CLARITIN) 10 MG tablet  Discussed continue with Flonase regimen   Advised taking Claritin, one tablet by mouth daily   If sx worsen or fail to improve call the clinic     (B44.967) Left foot pain  Comment: May be neuropathy related but unclear  Plan: Advised following up with current podiatrist and have a note faxed over after visit.   If sx worsen or fail to improve, call the clinic    (R00.0) Tachycardia  (Z63.8) Stress due to family tension  Comment: ongoing family stress  Plan: Discussed and will continue to monitor     (R29.898) Right arm weakness  Comment: referred to neurosurgery at [place]: Discussed and encouraged pt to call neurology dept at [facility] to rescheduled prevsiously missed appt for EMG/NCS- reminder given to pt.     F/u DM in 4 months.     By signing my name below, I, Lemar Lofty, attest that this documentation has been prepared under the direction and in the presence of Osa Craver, M.D.  Electronically Signed:  Lemar Lofty, Medical Scribe. 04/10/17 3:33 PM    I, Osa Craver, personally performed the services described in this documentation. All medical record entries made by the scribe were at my direction and in my presence. I have reviewed the chart and discharge instructions (if applicable) and agree that the record reflects my personal performance and is accurate and complete.   Kasen Sako P. Shimshon Narula,MD, 04/18/2017, 7:08 PM

## 2017-04-10 NOTE — Patient Instructions (Signed)
Call [facility] to reschedule EMG/nerve conduction studies  Ordered by Dr Delorse Lek MD

## 2017-04-11 LAB — HEMOGLOBIN A1C
ESTIMATED AVERAGE GLUCOSE: 126 (ref 74–160)
HEMOGLOBIN A1C: 6 % — ABNORMAL HIGH (ref 4.0–5.6)

## 2017-04-18 ENCOUNTER — Encounter (HOSPITAL_BASED_OUTPATIENT_CLINIC_OR_DEPARTMENT_OTHER): Payer: Self-pay

## 2017-04-18 ENCOUNTER — Inpatient Hospital Stay (HOSPITAL_BASED_OUTPATIENT_CLINIC_OR_DEPARTMENT_OTHER)
Admission: RE | Admit: 2017-04-18 | Disposition: A | Discharge status: Home / Self Care | Payer: Self-pay | Source: Emergency Department | Attending: Internal Medicine | Admitting: Internal Medicine

## 2017-04-18 DIAGNOSIS — J302 Other seasonal allergic rhinitis: Secondary | ICD-10-CM | POA: Insufficient documentation

## 2017-04-18 DIAGNOSIS — M79672 Pain in left foot: Secondary | ICD-10-CM

## 2017-04-18 DIAGNOSIS — Z638 Other specified problems related to primary support group: Secondary | ICD-10-CM | POA: Insufficient documentation

## 2017-04-18 LAB — BASIC METABOLIC PANEL
ANION GAP: 11 mmol/L (ref 5–15)
BUN (UREA NITROGEN): 12 mg/dL (ref 7–18)
CALCIUM: 9.1 mg/dL (ref 8.5–10.1)
CARBON DIOXIDE: 29 mmol/L (ref 21–32)
CHLORIDE: 103 mmol/L (ref 98–107)
CREATININE: 0.7 mg/dL (ref 0.4–1.2)
ESTIMATED GLOMERULAR FILT RATE: >60 mL/min (ref >60)
Glucose Random: 99 mg/dL (ref 74–160)
POTASSIUM: 4.1 mmol/L (ref 3.5–5.1)
SODIUM: 143 mmol/L (ref 136–145)

## 2017-04-18 LAB — CBC, PLATELET & DIFFERENTIAL
ABSOLUTE BASO COUNT: 0 10*3/uL (ref 0.0–0.1)
ABSOLUTE EOSINOPHIL COUNT: 0.3 10*3/uL (ref 0.0–0.8)
ABSOLUTE IMM GRAN COUNT: 0.05 10*3/uL — ABNORMAL HIGH (ref 0.00–0.03)
ABSOLUTE LYMPH COUNT: 3.5 10*3/uL (ref 0.6–5.9)
ABSOLUTE MONO COUNT: 1.4 10*3/uL (ref 0.2–1.4)
ABSOLUTE NEUTROPHIL COUNT: 9.1 10*3/uL — ABNORMAL HIGH (ref 1.6–8.3)
BASOPHIL %: 0.1 % (ref 0.0–1.2)
EOSINOPHIL %: 1.9 % (ref 0.0–7.0)
HEMATOCRIT: 40.7 % (ref 34.1–44.9)
HEMOGLOBIN: 13.6 g/dL (ref 11.2–15.7)
IMMATURE GRANULOCYTE %: 0.3 % (ref 0.0–0.4)
LYMPHOCYTE %: 24.6 % (ref 15.0–54.0)
MEAN CORP HGB CONC: 33.4 g/dL (ref 31.0–37.0)
MEAN CORPUSCULAR HGB: 30.4 pg (ref 26.0–34.0)
MEAN CORPUSCULAR VOL: 90.8 fL (ref 80.0–100.0)
MEAN PLATELET VOLUME: 10.8 fL (ref 8.7–12.5)
MONOCYTE %: 9.7 % (ref 4.0–13.0)
NEUTROPHIL %: 63.4 % (ref 40.0–75.0)
PLATELET COUNT: 305 10*3/uL (ref 150–400)
RBC DISTRIBUTION WIDTH STD DEV: 47.2 fL — ABNORMAL HIGH (ref 35.1–46.3)
RBC DISTRIBUTION WIDTH: 14.5 % — ABNORMAL HIGH (ref 11.5–14.3)
RED BLOOD CELL COUNT: 4.48 M/uL (ref 3.90–5.20)
WHITE BLOOD CELL COUNT: 14.4 10*3/uL — ABNORMAL HIGH (ref 4.0–11.0)

## 2017-04-18 LAB — TROPONIN I: TROPONIN I: <0.02 ng/mL (ref 0.00–0.04)

## 2017-04-18 LAB — LIPASE: LIPASE: 237 U/L (ref 73–393)

## 2017-04-18 LAB — PROTHROMBIN TIME
INR: 1 (ref 2.0–3.5)
PROTHROMBIN TIME: 11 SECONDS (ref 9.6–12.3)

## 2017-04-18 LAB — HEPATIC FUNCTION PANEL
ALANINE AMINOTRANSFERASE: 26 U/L (ref 12–45)
ALBUMIN: 3.9 g/dL (ref 3.4–5.0)
ALKALINE PHOSPHATASE: 91 U/L (ref 45–117)
ASPARTATE AMINOTRANSFERASE: 19 U/L (ref 8–34)
BILIRUBIN DIRECT: 0.1 mg/dl (ref 0.0–0.2)
BILIRUBIN TOTAL: 0.2 mg/dL (ref 0.2–1.0)
INDIRECT BILIRUBIN: 0.1 mg/dL — ABNORMAL LOW (ref 0.2–0.9)
TOTAL PROTEIN: 7.3 g/dL (ref 6.4–8.2)

## 2017-04-18 LAB — APTT: APTT: 30.5 SECONDS (ref 26.9–38.0)

## 2017-04-18 LAB — MAGNESIUM: MAGNESIUM: 1.5 mg/dL — ABNORMAL LOW (ref 1.8–2.4)

## 2017-04-18 LAB — BLOOD SUGAR FINGERSTICK (POINT OF CARE): FINGERSTICK GLUCOSE: 112 mg/dl (ref 74–160)

## 2017-04-18 LAB — NT-PROBNP: NT-proBNP: 102 pg/mL (ref 0–125)

## 2017-04-18 MED ORDER — MAGNESIUM OXIDE 400 (241.3 MG) MG PO TABS
800.00 mg | ORAL_TABLET | Freq: Once | ORAL | Status: AC
Start: 2017-04-18 — End: 2017-04-18
  Administered 2017-04-18: 800 mg via ORAL
  Filled 2017-04-18: qty 2

## 2017-04-18 MED ORDER — ONDANSETRON 4 MG PO TBDP
4.0000 mg | ORAL_TABLET | Freq: Once | ORAL | Status: AC
Start: 2017-04-18 — End: 2017-04-18
  Administered 2017-04-18: 4 mg via SUBLINGUAL
  Filled 2017-04-18: qty 1

## 2017-04-18 MED ORDER — ONDANSETRON HCL 4 MG/2ML IJ SOLN
4.0000 mg | Freq: Once | INTRAMUSCULAR | Status: AC
Start: 2017-04-18 — End: 2017-04-18
  Administered 2017-04-18: 4 mg via INTRAVENOUS
  Filled 2017-04-18: qty 2

## 2017-04-18 MED ORDER — ASPIRIN 81 MG PO CHEW
324.00 mg | CHEWABLE_TABLET | Freq: Once | ORAL | Status: AC
Start: 2017-04-18 — End: 2017-04-18
  Administered 2017-04-18: 324 mg via ORAL
  Filled 2017-04-18: qty 4

## 2017-04-18 MED ORDER — ALUMINUM & MAGNESIUM HYDROXIDE 200-200 MG/5ML PO SUSP
30.0000 mL | Freq: Once | ORAL | Status: AC
Start: 2017-04-18 — End: 2017-04-18
  Administered 2017-04-18: 30 mL via ORAL
  Filled 2017-04-18: qty 30

## 2017-04-18 MED ORDER — LIDOCAINE VISCOUS 2 % MT SOLN
10.0000 mL | Freq: Once | OROMUCOSAL | Status: AC
Start: 2017-04-18 — End: 2017-04-18
  Administered 2017-04-18: 10 mL via OROMUCOSAL
  Filled 2017-04-18: qty 15

## 2017-04-18 NOTE — ED Provider Notes (Signed)
ED nursing record was reviewed. Prior records as available electronically through the Epic record were reviewed. Patient was seen along with Dr. Jani Files and management was discussed with them.      HPI:    This 61 year old female patient -medical history significant for diabetes, obesity, high cholesterol, current tobacco use, COPD  - presents to the Emergency Department with chief complaint of chest pain.  Patient reports that 1 hour prior to arrival she developed acute onset left-sided chest pain.  Pain has gotten worse so she came to the emergency department for further evaluation.  Denies radiation of pain peer she has associated nausea, shortness of breath, and epigastric discomfort.  Denies fevers, chills, rash, neck pain, back pain, hemoptysis, pleuritic pain, vomiting, diarrhea, urinary symptoms, flank pain, motor neurodeficits, calf pain or swelling.    Patient reports that once in the past she had similar pain however the pain is more epigastric.  She was given a GI cocktail and her symptoms resolved.  She states that today's pain does feel similar however is worse and feels more like chest pain versus abdominal pain.    ROS: Pertinent positives were reviewed as per the HPI above. All other systems were reviewed and are negative.      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 right                tricompartment arthritis esp medial femoral                tibial  No date: Cerumen impaction      Comment: per steward records  02/01/2012: Chest pain  No date: Chronic bronchitis  02/01/2012: Chronic bronchitis  with COPD (chronic obstruct*  09/14/2015: Cough  No date: Depression      Comment: per steward records  No date: Diabetes  No date: Disorders of lipoid metabolism  No date: Esophageal reflux  No date: External hemorrhoid      Comment: per steward records  No date: HTN (hypertension)  02/01/2012: Hypoxia  02/16/2017: Left ear pain  06/07/2015: Left genital labial abscess  06/16/2015: Obesity, Class III, 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*  08/16/2015: Osteopenia      Comment: On xray of left knee per steward records                06/2014 Unclear if addressed  09/14/2015: Right ear impacted cerumen  08/15/2015: Type 2 diabetes mellitus without complication,*      Comment:  HEMOGLOBIN A1C (%) Date Value 07/15/2015 6.2                (H) ----------  Steward record - a1c 6.2 on                01/31/15 6.4 on 09/30/14 5.9 on 12/29/13 6.4                08/18/13   Patient Active Problem List:  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)     Tobacco use disorder     Spinal stenosis of lumbar region     HLD (hyperlipidemia)     Chronic bilateral low back pain without sciatica     Skin change     Tachycardia     Pineal gland cyst     History of vitamin D deficiency     Hemorrhoids     Venous (peripheral) insufficiency     Adrenal adenoma     Type 2 diabetes mellitus with microalbuminuria, without long-term current use of insulin (HCC)     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 (HCC)     Acute pain of right wrist     Abnormal PFTs     Screening for malignant neoplasm of breast     Skin irritation     H/O bone density study     Vertigo     Orthostatic hypotension     Social problem     Right arm weakness     Insomnia     Acute pain of left knee     Hx of total knee replacement, left     Seasonal allergic rhinitis     Left foot pain     Stress due to family tension        Past Surgical History: Past Surgical History:  No date: ANES NERVE MUSC TENDON FASCIA&BURSA KNEE&/POPLT  No date: FOOT SURGERY       Comment: bilateral  No date: LAPAROSCOPY SURG CHOLECYSTECTOMY  No date: OB ANTEPARTUM CARE CESAREAN DLVR & POSTPARTUM      Comment: x3  No date: TONSILLECTOMY & ADENOIDECTOMY <AGE 68  04/13/2009: TOTAL KNEE REPLACEMENT      Comment: left  08/11/2009: WRIST GANGLION EXCISION      Comment: left       Medications:   No current facility-administered medications on file prior to encounter.   Current Outpatient Prescriptions on File Prior to Encounter:  loratadine (CLARITIN) 10 MG tablet Take 1 tablet by mouth daily Disp: 30 tablet Rfl: 1   meloxicam (MOBIC) 7.5 MG tablet Take 1 tablet by mouth daily Disp: 30 tablet Rfl: 2   lisinopril (PRINIVIL,ZESTRIL) 20 MG tablet Take 1 tablet by mouth daily Disp: 90 tablet Rfl: 3   fluticasone (FLONASE) 50 MCG/ACT nasal spray 1 spray by Each Nostril route daily Disp: 1 Bottle Rfl: 11   albuterol (PROVENTIL HFA,VENTOLIN HFA, PROAIR HFA) 108 (90 Base) MCG/ACT inhaler Inhale 2 puffs into the lungs every 6 (six) hours as needed for Wheezing or Shortness of breath Disp: 1 Inhaler Rfl: 11   Lancets Use 1 time daily.  Dispense lancets that are covered by insurance. Dx e11.29 Disp: 100 each Rfl: 11   metFORMIN (GLUCOPHAGE-XR) 500 MG 24 hr tablet Take 4 tablets by mouth daily with breakfast Disp: 120 tablet Rfl: 11   atorvastatin (LIPITOR) 20 MG tablet take 1 tablet by mouth once daily Disp: 90 tablet Rfl: 3   metoprolol (TOPROL-XL) 25 MG 24 hr tablet Take 0.5 tablets by mouth nightly At bedtime Disp: 15 tablet Rfl: 11   glucose blood (ONETOUCH VERIO) test strip by Finger Test or Other route daily Use as instructed Disp: 100 strip Rfl: 11   Omega-3 Fatty Acids (FISH OIL) 1000 MG CAPS Take by mouth Disp:  Rfl:    cholecalciferol (VITAMIN D3) 1000 UNIT tablet Take 1 tablet by mouth daily Disp:  Rfl:    pregabalin (  LYRICA) 100 MG capsule Take 100 mg by mouth 2 (two) times daily With Dr Dwyane Dee Disp:  Rfl:    oxycodone-acetaminophen (PERCOCET) 5-325 MG per tablet Take 1 tablet by mouth 2 (two)  times daily as needed With Dr Dwyane Dee Disp:  Rfl:    aspirin 81 MG tablet Take 81 mg by mouth daily. Disp:  Rfl:          Social History:   Social History  Social History   Marital status: Divorced  Spouse name: N/A    Years of education: N/A  Number of children: N/A     Occupational History  None on file     Social History Main Topics   Smoking status: Current Every Day Smoker  0.50 Packs/day  For 31.00 Years     Smokeless tobacco: Never Used    Comment: quit smoking inform provided to patient    Alcohol use No    Drug use: No    Sexual activity: Not Currently    Partners: Male    Birth control/ protection: Tubal Ligation     Other Topics Concern   None on file     Social History Narrative    Lives with son    1 daughter and another son passed away    Disabled from back    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 Simons,MD, 07/15/2015, 2:56 PM               Allergies:  Review of Patient's Allergies indicates:   Codeine camsylate       Shortness of Breath, Other (See                            Comments)    Comment:Chest pain   Motrin [ibuprofen]      Nausea Only   Augmentin [amoxicil*    Nausea and Vomiting   Bactrim                 Rash, Itching      Physical Exam:  BP 115/74  Pulse 88  Temp 98.3 F  Resp 18  Wt 102.1 kg (225 lb)  LMP 10/22/2007  SpO2 97%  BMI 41.15 kg/m2    GENERAL: Well appearing, No acute distress, non-toxic.   SKIN:  Warm & Dry, no erythema or rash.  HEAD:  NCAT. Sclerae are anicteric and aninjected, oropharynx is clear with moist mucous membranes.  NECK: Supple. No JVD. No meningismus.  LUNGS:  Clear to auscultation bilaterally. No wheezes, rales, rhonchi.   HEART:  RRR.  No murmurs, rubs, or gallops.   ABDOMEN:  Soft, NTND.    EXTREMITIES:  No obvious deformities.  CSM intact x 4.      GENITOURINARY:  No CVA tenderness.   NEUROLOGIC:  Alert and oriented x4; moves all extremities well; speaking in clear fluent sentences.  PSYCHIATRIC:  Appropriate for age, time of day, and situation       ED Course and Medical Decision-making:  This 61 year old female patient was seen and evaluated for her chest pain.  On evaluation, patient is uncomfortable, she has her hand  on her chest.  She is afebrile stable vital signs.  Her lungs are clear to auscultation, do not hear any significant wheezing.  Do not suspect COPD exacerbation.  Cardiac auscultation reveals a regular rate and rhythm, no murmurs rubs or gallops.     EKG is obtained which revealed a sinus tachycardia, rate 105, no ST elevations.  Chest x-ray was obtained which revealed no evidence of acute cardiopulmonary pathology.  Labs are obtained and reviewed including a CBC, BMP, LFTs, lipase, troponin, proBNP.  Patient does have a slight leukocytosis of 14, labs otherwise unremarkable.  She has no elevation in her lipase, do not suspect pancreatitis.  She has no right upper quadrant tenderness or elevation of her LFTs, do not suspect cholecystitis.  She was given a GI cocktail with some improvement, was also given 324 chewable aspirin.    Patient symptoms may be related to acid reflux -however she has significant risk factors and complaining primarily of chest pain today.  Her heart pathway score puts her at high risk with a score of 4 -recommending further cardiac evaluation including serial troponins.    Discussion was had with the hospitalist and plan was made for admission for further observation.      Condition: Stable  Disposition: Admission      Diagnosis/Diagnoses:  Chest pain, unspecified type    Samantha Jaeger, PA-C    This Emergency Department patient encounter note was created using voice-recognition software and in real time during the ED visit. Please excuse any typographical errors that have not been edited out.

## 2017-04-18 NOTE — ED Triage Note (Signed)
Pt reports onset of epigastric pain tonight after eating dinner. States took a shower to try and make herself feel better. Pain now worse. Reports pain similar to episode she had previously which was treated with GI cocktail. Pain in chest increased on palpation. pwd and speaking in full sentences.

## 2017-04-18 NOTE — Narrator Note (Signed)
REPORT GIVEN TO [facility] RN. PT REQUESTING GINGER ALE PROVIDED.

## 2017-04-18 NOTE — ED Notes (Signed)
XR 2054

## 2017-04-18 NOTE — Narrator Note (Signed)
PT STATES THAT HER NAUSEA IS BETTER.

## 2017-04-18 NOTE — Narrator Note (Signed)
PT STATES THE GI COCKTAIL HELPED THE PAIN IN HER UPPER ABD BUT MADE HER NAUSEOUS. DRY HEAVING AT THIS TIME. MD AWARE.

## 2017-04-18 NOTE — Narrator Note (Signed)
HOSPITALIST WITH PT AT THIS TIME.

## 2017-04-18 NOTE — Narrator Note (Signed)
PT TO XRAY

## 2017-04-19 ENCOUNTER — Encounter (HOSPITAL_BASED_OUTPATIENT_CLINIC_OR_DEPARTMENT_OTHER): Payer: Self-pay | Admitting: Internal Medicine

## 2017-04-19 DIAGNOSIS — K2970 Gastritis, unspecified, without bleeding: Secondary | ICD-10-CM

## 2017-04-19 DIAGNOSIS — D72829 Elevated white blood cell count, unspecified: Secondary | ICD-10-CM

## 2017-04-19 HISTORY — DX: Gastritis, unspecified, without bleeding: K29.70

## 2017-04-19 LAB — CBC WITH PLATELET
HEMATOCRIT: 39.5 % (ref 34.1–44.9)
HEMOGLOBIN: 13 g/dL (ref 11.2–15.7)
MEAN CORP HGB CONC: 32.9 g/dL (ref 31.0–37.0)
MEAN CORPUSCULAR HGB: 30.2 pg (ref 26.0–34.0)
MEAN CORPUSCULAR VOL: 91.9 fL (ref 80.0–100.0)
MEAN PLATELET VOLUME: 10.6 fL (ref 8.7–12.5)
PLATELET COUNT: 268 10*3/uL (ref 150–400)
RBC DISTRIBUTION WIDTH STD DEV: 47.1 fL — ABNORMAL HIGH (ref 35.1–46.3)
RBC DISTRIBUTION WIDTH: 14.5 % — ABNORMAL HIGH (ref 11.5–14.3)
RED BLOOD CELL COUNT: 4.3 M/uL (ref 3.90–5.20)
WHITE BLOOD CELL COUNT: 13.4 10*3/uL — ABNORMAL HIGH (ref 4.0–11.0)

## 2017-04-19 LAB — BASIC METABOLIC PANEL
ANION GAP: 10 mmol/L (ref 5–15)
BUN (UREA NITROGEN): 14 mg/dL (ref 7–18)
CALCIUM: 8.6 mg/dL (ref 8.5–10.1)
CARBON DIOXIDE: 28 mmol/L (ref 21–32)
CHLORIDE: 105 mmol/L (ref 98–107)
CREATININE: 0.8 mg/dL (ref 0.4–1.2)
ESTIMATED GLOMERULAR FILT RATE: >60 mL/min (ref >60)
Glucose Random: 101 mg/dL (ref 74–160)
POTASSIUM: 4.3 mmol/L (ref 3.5–5.1)
SODIUM: 143 mmol/L (ref 136–145)

## 2017-04-19 LAB — MAGNESIUM: MAGNESIUM: 1.9 mg/dL (ref 1.8–2.4)

## 2017-04-19 LAB — XR CHEST 2 VIEWS

## 2017-04-19 LAB — TROPONIN I: TROPONIN I: <0.02 ng/mL (ref 0.00–0.04)

## 2017-04-19 LAB — BLOOD SUGAR FINGERSTICK (POINT OF CARE)
FINGERSTICK GLUCOSE: 95 mg/dl (ref 74–160)
FINGERSTICK GLUCOSE: 95 mg/dl (ref 74–160)

## 2017-04-19 MED ORDER — OMEPRAZOLE 20 MG PO CPDR
20.0000 mg | DELAYED_RELEASE_CAPSULE | Freq: Every day | ORAL | 0 refills | Status: DC
Start: 2017-04-19 — End: 2017-06-20

## 2017-04-19 MED ORDER — INSULIN LISPRO 100 UNIT/ML SC SOLN
0.0000 [IU] | Freq: Four times a day (QID) | SUBCUTANEOUS | Status: DC
Start: 2017-04-19 — End: 2017-04-19

## 2017-04-19 MED ORDER — OXYCODONE-ACETAMINOPHEN 5-325 MG PO TABS
1.0000 | ORAL_TABLET | Freq: Two times a day (BID) | ORAL | Status: DC | PRN
Start: 2017-04-19 — End: 2017-04-19
  Administered 2017-04-19: 1 via ORAL
  Filled 2017-04-19: qty 1

## 2017-04-19 MED ORDER — OMEPRAZOLE 20 MG PO CPDR: 20 mg | capsule | Freq: Every day | ORAL | 0 refills | 0 days | Status: DC

## 2017-04-19 MED ORDER — ALBUTEROL SULFATE (2.5 MG/3ML) 0.083% IN NEBU
2.5000 mg | INHALATION_SOLUTION | RESPIRATORY_TRACT | Status: DC | PRN
Start: 2017-04-19 — End: 2017-04-19

## 2017-04-19 MED ORDER — FAMOTIDINE 20 MG/2ML IV SOLN
20.0000 mg | Freq: Two times a day (BID) | INTRAVENOUS | Status: DC
Start: 2017-04-19 — End: 2017-04-19
  Administered 2017-04-19 (×2): 20 mg via INTRAVENOUS
  Filled 2017-04-19 (×2): qty 2

## 2017-04-19 MED ORDER — FLUTICASONE PROPIONATE 50 MCG/ACT NA SUSP
1.0000 | Freq: Every day | NASAL | Status: DC
Start: 2017-04-19 — End: 2017-04-19
  Administered 2017-04-19: 1 via NASAL
  Filled 2017-04-19: qty 16

## 2017-04-19 MED ORDER — ATORVASTATIN CALCIUM 20 MG PO TABS
20.0000 mg | ORAL_TABLET | Freq: Every day | ORAL | Status: DC
Start: 2017-04-19 — End: 2017-04-19

## 2017-04-19 MED ORDER — LORATADINE 10 MG PO TABS
10.0000 mg | ORAL_TABLET | Freq: Every day | ORAL | Status: DC
Start: 2017-04-19 — End: 2017-04-19
  Administered 2017-04-19: 10 mg via ORAL
  Filled 2017-04-19: qty 1

## 2017-04-19 MED ORDER — METOPROLOL SUCCINATE ER 25 MG PO TB24
12.5000 mg | ORAL_TABLET | Freq: Every evening | ORAL | Status: DC
Start: 2017-04-19 — End: 2017-04-19
  Administered 2017-04-19: 12.5 mg via ORAL
  Filled 2017-04-19: qty 1

## 2017-04-19 MED ORDER — LISINOPRIL 20 MG PO TABS
20.0000 mg | ORAL_TABLET | Freq: Every day | ORAL | Status: DC
Start: 2017-04-19 — End: 2017-04-19
  Administered 2017-04-19: 20 mg via ORAL
  Filled 2017-04-19: qty 1

## 2017-04-19 MED ORDER — HEPARIN SODIUM (PORCINE) 5000 UNIT/ML IJ SOLN
5000.0000 [IU] | Freq: Two times a day (BID) | INTRAMUSCULAR | Status: DC
Start: 2017-04-19 — End: 2017-04-19
  Administered 2017-04-19 (×2): 5000 [IU] via SUBCUTANEOUS
  Filled 2017-04-19 (×2): qty 1

## 2017-04-19 MED ORDER — ASPIRIN EC 81 MG PO TBEC
81.0000 mg | DELAYED_RELEASE_TABLET | Freq: Every day | ORAL | Status: DC
Start: 2017-04-19 — End: 2017-04-19
  Administered 2017-04-19: 81 mg via ORAL
  Filled 2017-04-19: qty 1

## 2017-04-19 MED ORDER — PREGABALIN 100 MG PO CAPS
100.0000 mg | ORAL_CAPSULE | Freq: Two times a day (BID) | ORAL | Status: DC
Start: 2017-04-19 — End: 2017-04-19
  Administered 2017-04-19: 100 mg via ORAL
  Filled 2017-04-19 (×2): qty 1

## 2017-04-19 MED ORDER — ONDANSETRON HCL 4 MG/2ML IJ SOLN
4.0000 mg | INTRAMUSCULAR | Status: DC | PRN
Start: 2017-04-19 — End: 2017-04-19

## 2017-04-19 NOTE — H&P (Signed)
H&P NOTE     Chief Complaint:   Patient presents with:  Chest Pain: CHEST PAIN      History of Present Illness:  Samantha Cameron is a pleasant 61 year old female with history of NIDDM2, chronic bronchitis with COPD, tobacco use and GERD/gastritis worsened by ibuprofen who presents tonight with sudden onset of epigastric/chest pain after eating. She reports the pain started while she was sitting on the couch, after dinner, around 7:30pm. She did not have any nausea, but for the past several days she has had abdominal bloating and felt generally uncomfortable. She has also had a headache for a week, for which her PCP Dr Gilberto Better started her on loratadine and flonase. She describes the pain as just below her xyphoid process, constant and worse if someone pushes down. It feels sharp. She denies any worsened dyspnea, though she tried her inhaler at home which did not improve the pain at all. The pain does not worsen with movement. In the ED, she vomited when given maalox, and since then has felt nauseated, though reports feeling that the pain has improved. During our conversation, she does recall that she saw orthopedics at the end of April and was started on meloxicam. She has not taken it daily, but had forgotten that she was supposed to stop it if she had any stomach upset.    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  No date: Esophageal reflux  No date: External hemorrhoid      Comment: per steward records  No date: HTN (hypertension)  02/01/2012: Hypoxia  02/16/2017: Left ear pain  06/07/2015: Left genital labial abscess  06/16/2015: Obesity, Class III, 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*  08/16/2015: Osteopenia      Comment: On xray of left knee per steward records                06/2014 Unclear if addressed  09/14/2015: Right ear impacted cerumen  08/15/2015: Type 2 diabetes mellitus without complication,*      Comment:  HEMOGLOBIN A1C (%) Date Value 07/15/2015 6.2                (H) ----------  [facility]ward record - a1c 6.2 on  01/31/15 6.4 on 09/30/14 5.9 on 12/29/13 6.4                08/18/13     Past Surgical History:   Past Surgical History:  No date: ANES NERVE MUSC TENDON FASCIA&BURSA KNEE&/POPLT  No date: FOOT SURGERY      Comment: bilateral  No date: LAPAROSCOPY SURG CHOLECYSTECTOMY  No date: OB ANTEPARTUM CARE CESAREAN DLVR & POSTPARTUM      Comment: x3  No date: TONSILLECTOMY & ADENOIDECTOMY <AGE 29  04/13/2009: TOTAL KNEE REPLACEMENT      Comment: left  08/11/2009: WRIST GANGLION EXCISION      Comment: left     Medications prior to Admission:     No current facility-administered medications on file prior to encounter.   Current Outpatient Prescriptions on File Prior to Encounter:  loratadine (CLARITIN) 10 MG tablet Take 1 tablet by mouth daily Disp: 30 tablet Rfl: 1   meloxicam (MOBIC) 7.5 MG tablet Take 1 tablet by mouth daily Disp: 30 tablet Rfl: 2   lisinopril (PRINIVIL,ZESTRIL) 20 MG tablet Take 1 tablet by mouth daily Disp: 90 tablet Rfl: 3   fluticasone (FLONASE) 50 MCG/ACT nasal spray 1 spray by Each Nostril route daily Disp: 1  Bottle Rfl: 11   albuterol (PROVENTIL HFA,VENTOLIN HFA, PROAIR HFA) 108 (90 Base) MCG/ACT inhaler Inhale 2 puffs into the lungs every 6 (six) hours as needed for Wheezing or Shortness of breath Disp: 1 Inhaler Rfl: 11   Lancets Use 1 time daily.  Dispense lancets that are covered by insurance. Dx e11.29 Disp: 100 each Rfl: 11   metFORMIN (GLUCOPHAGE-XR) 500 MG 24 hr tablet Take 4 tablets by mouth daily with breakfast Disp: 120 tablet Rfl: 11   atorvastatin (LIPITOR) 20 MG tablet take 1 tablet by mouth once daily Disp: 90 tablet Rfl: 3   metoprolol (TOPROL-XL) 25 MG 24 hr tablet Take 0.5 tablets by mouth nightly At bedtime Disp: 15 tablet Rfl: 11   glucose blood (ONETOUCH VERIO) test strip by Finger Test or Other route daily Use as instructed Disp: 100 strip Rfl: 11   Omega-3 Fatty Acids (FISH OIL) 1000 MG CAPS Take by mouth Disp:  Rfl:    cholecalciferol (VITAMIN D3) 1000 UNIT tablet Take 1 tablet by mouth daily Disp:  Rfl:    pregabalin (LYRICA) 100 MG capsule Take 100 mg by mouth 2 (two) times daily With Dr Dwyane Dee Disp:  Rfl:    oxycodone-acetaminophen (PERCOCET) 5-325 MG per tablet Take 1 tablet by mouth 2 (two) times daily as needed With Dr Dwyane Dee Disp:  Rfl:    aspirin 81 MG tablet Take 81 mg by mouth daily. Disp:  Rfl:        Allergies:   Review of Patient's Allergies indicates:   Codeine camsylate       Shortness of Breath, Other (See                            Comments)    Comment:Chest pain   Motrin [ibuprofen]      Nausea Only   Augmentin [amoxicil*    Nausea and Vomiting   Bactrim                 Rash, Itching    Social History:    Social History  Social History   Marital status: Divorced  Spouse name: N/A    Years of education: N/A  Number of children: N/A  Occupational History  None on file     Social History Main Topics   Smoking status: Current Every Day Smoker  0.25 Packs/day  For 31.00 Years     Smokeless tobacco: Never Used    Comment: quit smoking inform provided to patient    Alcohol use No     Drug use: No    Sexual activity: Not Currently    Partners: Male    Birth control/ protection: Tubal Ligation     Other Topics Concern   None on file     Social History Narrative    Lives with son    1 daughter and another son passed away    Disabled from back    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 Simons,MD, 07/15/2015, 2:56 PM           Family History:     Family History    Diabetes Mother     Heart Brother     Comment: CAD and AAA    Hypertension Mother     Arthritis Mother     Stroke Mother     Diabetes Brother     Comment: same brother    Cancer - Other Father     Comment: type uncertain    Thyroid Sister     Alcohol/Drug Abuse Son     OTHER Son     Comment: ?brain aneurysm per steward records    No Known Problems Grandchild     Comment: 6    Cancer - Breast FamHxNeg     Cancer - Cervical FamHxNeg     Cancer - Ovarian FamHxNeg     Cancer - Colon FamHxNeg     Psychiatric Illness FamHxNeg        Physical Exam:  BP 123/62  Pulse 89  Temp 98.1 F (36.7 C) (Oral)  Resp 20  Wt 102.1 kg (225 lb)  LMP 10/22/2007  SpO2 97%  BMI 41.15 kg/m2  General- No acute distress, appears stated age  87- NCAT, PERRL, ENT exam normal, mildly dry oral mucosa  Neck- normal, no lymphadenopathy  Respiratory- lung fields clear to auscultation throughout, no wheezing, rales, normal symmetric air entry  Cardiovascular- S1, S2 normal, no murmur, click, rub or gallop, regular rate and rhythm, no JVD, peripheral pulses intact, no peripheral edema  Gastrointestinal- Abdomen soft, non-tender. BS normal. No masses, no organomegaly  Ext- Extremities normal. No deformities, edema, or skin discoloration, Pulses 2+  Musculoskeletal- no deformities  Skin- intact  Neuro- alert, oriented, moving all extremities  Psych-  cooperative      Lab Results    Recent Labs   04/18/17  2016   NA 143   K 4.1   CL 103   CO2 29   BUN 12   CREAT 0.7   GLUCOSER 99   CA 9.1   WBC 14.4*   HGB 13.6   HCT 40.7   PLTA 305   AST 19   ALT 26   TBILI 0.2   ALKPHOS 91         TROPONIN I (ng/mL)   Date Value   04/18/2017 < 0.02   11/20/2012 0.01  11/19/2012 0.02   ----------      NT-proBNP (pg/mL)   Date Value   04/18/2017 102   ----------  No results found for: BNP      Lactic Acid Results     Date/Time Component Value Reference Range Lab Status    06/07/15 1800 LACTICACID 1.9 0.4 - 2.0 mmol/L Final result        EKG with sinus tachycardia, prominent T-waves, normal intervals, no ST/T changes    Imaging:   CXR reviewed and stable compared to prior    Assessment/Plan:  This is a 61yo female presenting with atypical chest pain    # atypical chest pain  - cardiac risk factors noted, however likely d/t gastritis induced by meloxicam use  - Will monitor on telemetry and repeat troponin  - hold NSAIDs and treat with zofran and pepcid  - continue statin    # sinus tachycardia  - resolved at time of my evaluation  - telemetry as above    # Leukocytosis, mild  - suspect due to stress demargination (primarily neutrophils)  - trend CBC    # NIDDM2  - hold metformin and cover with sliding scale and ACHS glucose monitoring    # COPD without exacerbation  - continue outpatient prn inhalers  - continue loratadine and flonase    # tobacco dependence  - continue outpatient counseling    FEN- full liquid controlled carb diet, can adv to cardiac controlled carb when nausea improves    PROPHYLAXIS- SQH    CODE STATUS- full code      Gale Journey, MD  Pager Number: [phone]

## 2017-04-19 NOTE — Plan of Care (Signed)
Problem: Safety  Goal: Free from accidental physical injury    Intervention: Assess patient for fall risk  Pt ambulate in room independently. Gait steady.       Problem: Pain  Goal: Patient's pain/discomfort is manageable  Assess and monitor patient's pain using appropriate pain scale. Collaborate with interdisciplinary team and initiate plan and interventions as ordered. Re-assess patient's pain level 30 - 60 minutes after pain management intervention.      Intervention: Assess patient's pain level  Pt c/o back pain 7/10. Med given. Awaiting for effect. Pt denies chest pain.       Problem: Nutrition  Altered Nutrition and Hydration   Goal: Patient Maintains Adequate Hydration    Intervention: Monitor Intake and Output  Urine output should be 49ml/hour or more.   Zofran given in ER. Pt feels less nausea. c/o "stomach bloated". IV famotidine given.

## 2017-04-19 NOTE — Discharge Instr - Activity (Signed)
Gradual return to usual activities as tolerated.

## 2017-04-19 NOTE — Progress Notes (Signed)
Pt is admitted from ER to [facility] at 0030 with diagnosis of Chest pain, Gastritis without bleeding, Tachycardia, COPD, elevated white blood cell (WBC) count, Tobacco use disorde,  Type 2 diabetes mellitus. Pt is alert and orientated. Vital sign stable. NSR on tele monitor. No shortness of breath. No c/o chest pain. C/O "stomach bloated". IV Pepcid given. Oxycodone given for back pain 7/10. Pt slept after med given. Skin warm and dry. Pt ambulatory. Gait steady. Wt 223lb at 6am. FS 95.

## 2017-04-19 NOTE — Progress Notes (Addendum)
Chart reviewed.  Case to be discussed in multidisciplinary rounds.  Meeting with patient at the bedside.  Patient states that she lives at home with her 63 year old granddaughter.  Patient states that she is the child's foster parent.  The child is safe with other family members while the patient is hospitalized.  Patient describes independence with ADL's, most IADL's and ambulation.  She uses a cane intermittently.  She denies falls.  Patient states that she has had VNA in the remote past.  She does not remember which agency.  She has never been to short term rehabilitation.  Patient states that she feels safe at home and she plans on returning upon discharge.  Her son or daughter will be coming to pick her up upon discharge.  [facility] notice reviewed with patient.  Copies to patient and chart.    Plan: No CM needs identified.  Anticipate discharge home with outpatient follow up.  Patient is in agreement with plan and denies any further questions and/or concerns re: plan of discharge.      Addendum: Discharge home with outpatient follow up today.  Patient is in agreement.

## 2017-04-19 NOTE — Discharge Instr - Diet (Signed)
Diabetic, Heart-healthy diet (low fat, low cholesterol, low salt/sodium, low sugar).

## 2017-04-19 NOTE — Discharge Inst - Reason you came to the hospital (Addendum)
Reason you came to the hospital: pain in upper abdomen (epigastrium)    Discharge Diagnoses:   --Likely irritation of the stomach (gastritis)  --mildly elevated white blood cell count    Description of Hospital Course:   --heart tests showed no evidence of a heart attack  --your symptoms improved and you were tolerating oral nutrition  --elevated white blood cells were improving    Important Results:   --blood tests showing no signs of a heart attack  --blood test showing mildly high white blood cell count. This may be a reaction to physical stress.     Reasons to call your PCP: return of abdominal pain, vomiting, bloody or black stools, loss of appetite, fever, chills, or for other bothersome issues.

## 2017-04-19 NOTE — Discharge Inst - Recommended Labs, Proc, Tests, Or (Addendum)
Recheck of your white blood cell count with your PCP.     Consider with your PCP, a routine screening colonoscopy. It appears you may be due for this screening test if you have not already had it.

## 2017-04-19 NOTE — Plan of Care (Signed)
Problem: Pain  Goal: Patient's pain/discomfort is manageable  Assess and monitor patient's pain using appropriate pain scale. Collaborate with interdisciplinary team and initiate plan and interventions as ordered. Re-assess patient's pain level 30 - 60 minutes after pain management intervention.      Intervention: Assess patient's pain level  Pt denied any pain. Diet advanced form full liquid to consistent carb, tolerated.  Due meds served and tolerated. Fs covered per insulin protocol.  Up ad lib, independently. Tele 2414 NSR. Pt is d/cd home w/o services.  D/c huddle completed with MD, d/c instructions reviewed with pt who verbalized understanding.  Pt will leave when her ride is here.

## 2017-04-20 ENCOUNTER — Other Ambulatory Visit (HOSPITAL_BASED_OUTPATIENT_CLINIC_OR_DEPARTMENT_OTHER): Payer: Self-pay | Admitting: Internal Medicine

## 2017-04-20 DIAGNOSIS — R42 Dizziness and giddiness: Secondary | ICD-10-CM

## 2017-04-20 NOTE — Progress Notes (Signed)
PER Pharmacy, Samantha Cameron is a 61 year old female has requested a refill of MECLIZINE.        This medication has been reordered with the same quantity and end date as the last time it was prescribed.         Last Office Visit: 04/10/17     With: Osa Craver  Last Physical Exam: 08/15/15      Other Med Adult:  Most Recent BP Reading(s)  04/19/17 : 103/71          Cholesterol (mg/dL)   Date Value   08/06/2016 143   ----------    LOW DENSITY LIPOPROTEIN DIRECT (mg/dL)   Date Value   08/06/2016 67   ----------    HIGH DENSITY LIPOPROTEIN (mg/dL)   Date Value   08/06/2016 44   ----------    TRIGLYCERIDES (mg/dl)   Date Value   02/08/2009 189 (H)   ----------        THYROID SCREEN TSH REFLEX FT4 (uIU/mL)   Date Value   04/12/2016 1.260   ----------      No results found for: TSH      HEMOGLOBIN A1C (%)   Date Value   04/10/2017 6.0 (H)   ----------    No results found for: POCA1C        INR (no units)   Date Value   04/18/2017 1.0   05/27/2016 1.0   02/01/2012 < 1.0 (L)   ----------      SODIUM (mmol/L)   Date Value   04/19/2017 143   ----------      POTASSIUM (mmol/L)   Date Value   04/19/2017 4.3   ----------          CREATININE (mg/dL)   Date Value   04/19/2017 0.8   ----------    Documented patient preferred pharmacies:    [facility] [place], [place]  Phone: [phone] Fax: [phone]

## 2017-04-21 LAB — EKG

## 2017-04-22 ENCOUNTER — Other Ambulatory Visit: Payer: Self-pay

## 2017-04-22 NOTE — Progress Notes (Signed)
Dear RNs;  Samantha Cameron was discharged from [facility] to home on 04/19/2017.  She  will receive support from the [facility] for the next 30 days. We will provide medical appointment reminders and assist with transportation, medication issues, and other logistical problems.     [name]  will not receive VNA services and is not currently receiving elder home services. Patient is self care and will follow up with physician.       She can be reached at: [phone].  Best time to call is: after 9:00 a.m.  Please contact me with any questions or requests.    Jay Schlichter  Transition Facilitator  [phone]

## 2017-04-23 ENCOUNTER — Ambulatory Visit (HOSPITAL_BASED_OUTPATIENT_CLINIC_OR_DEPARTMENT_OTHER): Payer: MEDICARE | Admitting: Physician Assistant

## 2017-04-23 VITALS — BP 97/73 | HR 108 | Temp 97.9°F | Wt 223.0 lb

## 2017-04-23 DIAGNOSIS — R0789 Other chest pain: Secondary | ICD-10-CM

## 2017-04-23 DIAGNOSIS — F419 Anxiety disorder, unspecified: Secondary | ICD-10-CM

## 2017-04-23 DIAGNOSIS — K2900 Acute gastritis without bleeding: Secondary | ICD-10-CM

## 2017-04-23 NOTE — Discharge Summary (Signed)
Physician Discharge Summary     Patient ID:  Samantha Cameron  [id number]  61 year old  Feb 12, 1956    Admission Date: 04/18/2017    Discharge Date: 04/19/2017    Admitting Physician: Gale Journey    Discharge Physician: Cyndia Bent, MD   [facility]. [phone]  Pager [phone]   E-mail: [email or web address]   24/7 Hospitalist Coverage: Pager [phone]  [facility] Medical Records Department [phone] (Main Switchboard)    Admission Diagnoses: Epigastric and chest pain    Discharge Diagnoses:   Epigastric abdominal pain, possibly gastritis  Noncardiac chest pain  Leukocytosis  Hypomagnesemia    Admission Condition: Stable    Discharged Condition: Improved, pain nearly resolved, tolerating oral nutrition    Indication for Admission: Management of epigastric/chest pain      Hospital Course:   Epigastric pain: Gastritis was a leading consideration.  She had been using meloxicam, which perhaps played a role.  She does also have a history of H pylori infection.  She ruled out for myocardial infarction.  Symptoms improved and she was tolerating oral nutrition.  Discontinuation of meloxicam was recommended.  Should she have continued symptoms, signs of GI bleeding, or swallowing difficulties, upper endoscopy is recommended.  Would also recommend assessment of current H pylori status.    Chest pain: As above, this appear to be primarily epigastric pain of GI origin.    Leukocytosis: 14 at presentation.  Improved to 13 on the day of discharge.  Thought likely a stress reaction.  No other infectious findings were evident.  Suggest recheck after the acute episode to ensure normalization.    Hypomagnesemia: Improved from 1.5-1.9 with supplementation.  -----------------------------------------------------------------------------    Pending Test Results:   None    Outpatient Follow-up Issues:  -Consider assessment of current H pylori status  -If epigastric discomfort persists, there is emergence of more worrisome  findings such as evidence of bleeding, swallowing difficulties, or weight loss, upper endoscopy is recommended.  -Consider recheck of white blood cell count to ensure complete normalization    Consults: Case management    Discharge Exam:  Subjective: Feeling much better, pain quite reduced, no palpitations, no vomiting, no bloody or black stools, no chest pain.  Eating.  BP 103/71  Pulse 67  Temp 97.6 F (36.4 C) (Oral)  Resp 19  Ht 5\' 2"  (1.575 m)  Wt 101.2 kg (223 lb)  LMP 10/22/2007  SpO2 97%  BMI 40.79 kg/m2  Alert, well-appearing  Lungs clear  Cardiac rhythm regular  Normoactive bowel sounds, nondistended, nontender.  No leg edema.    Code Status during hospital stay: Full Code    Disposition: Home      Patient Instructions:     Diet: Diabetic, Heart-healthy diet (low fat, low cholesterol, low salt/sodium, low sugar).     Activity: Gradual return to usual activities as tolerated.        Discharge Medications:    [name], [name]   Home Medication Instructions QQP:[id number]    Printed on:04/23/17 1430   Medication Information                      albuterol (PROVENTIL HFA,VENTOLIN HFA, PROAIR HFA) 108 (90 Base) MCG/ACT inhaler  Inhale 2 puffs into the lungs every 6 (six) hours as needed for Wheezing or Shortness of breath             aspirin 81 MG tablet  Take 81 mg by mouth daily.  atorvastatin (LIPITOR) 20 MG tablet  take 1 tablet by mouth once daily             cholecalciferol (VITAMIN D3) 1000 UNIT tablet  Take 1 tablet by mouth daily             fluticasone (FLONASE) 50 MCG/ACT nasal spray  1 spray by Each Nostril route daily             glucose blood (ONETOUCH VERIO) test strip  by Finger Test or Other route daily Use as instructed             Lancets  Use 1 time daily.  Dispense lancets that are covered by insurance. Dx e11.29             lisinopril (PRINIVIL,ZESTRIL) 20 MG tablet  Take 1 tablet by mouth daily             loratadine (CLARITIN) 10 MG tablet  Take 1 tablet by mouth daily              metFORMIN (GLUCOPHAGE-XR) 500 MG 24 hr tablet  Take 4 tablets by mouth daily with breakfast             metoprolol (TOPROL-XL) 25 MG 24 hr tablet  Take 0.5 tablets by mouth nightly At bedtime             Omega-3 Fatty Acids (FISH OIL) 1000 MG CAPS  Take by mouth             omeprazole (PRILOSEC) 20 MG capsule  Take 1 capsule by mouth daily For 30 days.             oxycodone-acetaminophen (PERCOCET) 5-325 MG per tablet  Take 1 tablet by mouth 2 (two) times daily as needed With Dr Dwyane Dee             pregabalin (LYRICA) 100 MG capsule  Take 100 mg by mouth 2 (two) times daily With Dr Dwyane Dee             -----------------------------------------------------------------------------   Discharge medications in relation to outpatient regimen:       Medication List      START taking these medications    omeprazole 20 MG capsule  Commonly known as:  PriLOSEC  Take 1 capsule by mouth daily For 30 days.        CONTINUE taking these medications    albuterol 108 (90 Base) MCG/ACT inhaler  Commonly known as:  PROVENTIL HFA,VENTOLIN HFA, PROAIR HFA  Inhale 2 puffs into the lungs every 6 (six) hours as needed for Wheezing or Shortness of breath     aspirin 81 MG tablet     atorvastatin 20 MG tablet  Commonly known as:  LIPITOR  take 1 tablet by mouth once daily     cholecalciferol 1000 UNIT tablet  Commonly known as:  VITAMIN D3     Fish Oil 1000 MG Caps     fluticasone 50 MCG/ACT nasal spray  Commonly known as:  FLONASE  1 spray by Each Nostril route daily     glucose blood test strip  Commonly known as:  ONETOUCH VERIO  by Finger Test or Other route daily Use as instructed  Notes to patient:  Use as directed     Lancets  Use 1 time daily.  Dispense lancets that are covered by insurance. Dx e11.29  Notes to patient:  Use as directed     lisinopril 20 MG tablet  Commonly  known as:  PRINIVIL,ZESTRIL  Take 1 tablet by mouth daily     loratadine 10 MG tablet  Commonly known as:  CLARITIN  Take 1 tablet by mouth daily     metFORMIN 500  MG 24 hr tablet  Commonly known as:  GLUCOPHAGE-XR  Take 4 tablets by mouth daily with breakfast     metoprolol 25 MG 24 hr tablet  Commonly known as:  TOPROL-XL  Take 0.5 tablets by mouth nightly At bedtime     oxyCODONE-acetaminophen 5-325 MG per tablet  Commonly known as:  PERCOCET     pregabalin 100 MG capsule  Commonly known as:  LYRICA        STOP taking these medications    meloxicam 7.5 MG tablet  Commonly known as:  MOBIC           Where to Get Your Medications      These medications were sent to [facility], [address] [place], [place] [address]    Hours:  Mon-Fri 8:00am-10:00pm, Sat 9:00am-9:00pm, Sun 9:00am-9:00pm Phone:  [phone]    omeprazole 20 MG capsule         Post-Hospital Follow-Up:   Follow-up Information     Follow up With Specialties Details Why Contact Info    Christopher P. [name], MD Internal Medicine, Pediatrics Go on 04/23/2017 @ 10:50 , For follow- up discharge [facility] [place] [address]  [phone]  fax #: [phone]            Cyndia Bent, MD  [place]

## 2017-04-23 NOTE — Progress Notes (Signed)
Samantha Cameron is a 61 year old female patient of Christopher P. Simons,MD  presenting for hospital follow up. Patient admitted after presenting to [facility] ED with sudden onset of chest/epigastric pain on 05/31.   ACS was ruled out. Pain likely due to gastritis secondary to Meloxicam. Patient was treated with GI cocktail, Zofran and Pepcid . Patient was discharged on Prilosec 20 mg daily.   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)     Tobacco use disorder     Spinal stenosis of lumbar region     HLD (hyperlipidemia)     Chronic bilateral low back pain without sciatica     Skin change     Tachycardia     Pineal gland cyst     History of vitamin D deficiency     Hemorrhoids     Venous (peripheral) insufficiency     Adrenal adenoma     Type 2 diabetes mellitus with microalbuminuria, without long-term current use of insulin (HCC)     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 (HCC)     Acute pain of right wrist     Abnormal PFTs     Screening for malignant neoplasm of breast     Skin irritation     H/O bone density study     Vertigo     Orthostatic hypotension     Social problem     Right arm weakness     Insomnia     Acute pain of left knee     Hx of total knee replacement, left     Seasonal allergic rhinitis     Left foot pain     Stress due to family tension     Gastritis without bleeding     Leucocytosis      HOSPITAL-TO-HOME COMMUNITY COLLABORATION PROGRAM      Current Outpatient Prescriptions on File Prior to Visit:  meclizine (ANTIVERT) 25 MG TABS take 1 tablet by mouth every 8 hours if needed Disp: 60 tablet Rfl: 1   omeprazole (PRILOSEC) 20 MG capsule Take 1 capsule by  mouth daily For 30 days. Disp: 30 capsule Rfl: 0   loratadine (CLARITIN) 10 MG tablet Take 1 tablet by mouth daily Disp: 30 tablet Rfl: 1   lisinopril (PRINIVIL,ZESTRIL) 20 MG tablet Take 1 tablet by mouth daily Disp: 90 tablet Rfl: 3   fluticasone (FLONASE) 50 MCG/ACT nasal spray 1 spray by Each Nostril route daily Disp: 1 Bottle Rfl: 11   albuterol (PROVENTIL HFA,VENTOLIN HFA, PROAIR HFA) 108 (90 Base) MCG/ACT inhaler Inhale 2 puffs into the lungs every 6 (six) hours as needed for Wheezing or Shortness of breath Disp: 1 Inhaler Rfl: 11   Lancets Use 1 time daily.  Dispense lancets that are covered by insurance. Dx e11.29 Disp: 100 each Rfl: 11   metFORMIN (GLUCOPHAGE-XR) 500 MG 24 hr tablet Take 4 tablets by mouth daily with breakfast Disp: 120 tablet Rfl: 11   atorvastatin (LIPITOR) 20 MG tablet take 1 tablet by mouth once daily Disp: 90 tablet  Rfl: 3   metoprolol (TOPROL-XL) 25 MG 24 hr tablet Take 0.5 tablets by mouth nightly At bedtime Disp: 15 tablet Rfl: 11   glucose blood (ONETOUCH VERIO) test strip by Finger Test or Other route daily Use as instructed Disp: 100 strip Rfl: 11   Omega-3 Fatty Acids (FISH OIL) 1000 MG CAPS Take by mouth Disp:  Rfl:    cholecalciferol (VITAMIN D3) 1000 UNIT tablet Take 1 tablet by mouth daily Disp:  Rfl:    pregabalin (LYRICA) 100 MG capsule Take 100 mg by mouth 2 (two) times daily With Dr Dwyane Dee Disp:  Rfl:    oxycodone-acetaminophen (PERCOCET) 5-325 MG per tablet Take 1 tablet by mouth 2 (two) times daily as needed With Dr Dwyane Dee Disp:  Rfl:    aspirin 81 MG tablet Take 81 mg by mouth daily. Disp:  Rfl:      No current facility-administered medications on file prior to visit.        Review of Systems   Cardiovascular: Negative for chest pain.   Psychiatric/Behavioral: The patient is nervous/anxious.      OBJECTIVE  BP 97/73 (Site: RA, Position: Sitting, Cuff Size: Lrg)  Pulse 108  Temp 97.9 F (36.6 C) (Temporal)  Wt 101.2 kg (223 lb)  LMP 10/22/2007  SpO2 97%  BMI 40.79  kg/m2    Physical Exam   Constitutional: She is oriented to person, place, and time. No distress.   HENT:   Mouth/Throat: Oropharynx is clear and moist.   Cardiovascular: Normal rate and regular rhythm.    Pulmonary/Chest: Effort normal and breath sounds normal. No respiratory distress.   Abdominal: Soft. Bowel sounds are normal. She exhibits no distension. There is no tenderness.   Neurological: She is alert and oriented to person, place, and time.   Psychiatric: Judgment and thought content normal.       ASSESSMENT/PLAN:    (K29.00) Acute gastritis without hemorrhage, unspecified gastritis type  (primary encounter diagnosis)  (R07.89) Atypical chest pain  Comment: secondary to NSAIDs. Pain is resolving   Plan: Continue on Omeprazole   Avoid NSAIDs, spicy/fatty food, chocolate, caffeine       (F41.9) Anxiety  Comment:   In setting of acute chest/epigastric pain due to NSAID induced gastritis.   Plan: Patient reassured   She will continue on PPI for gastritis   Follow up if worse     1. The patient indicates understanding of these issues and agrees with the plan.  2.  The patient is given an After Visit Summary sheet that lists all of their medications with directions, their allergies, orders placed during this encounter, immunization dates, and follow- up instructions.  3. I reviewed the patient's medical information and medical history   4.  I reconciled the patient's medication list and prepared and supplied needed refills.  5.  I have reviewed the past medical, family, and social history sections including the medications and allergies listed in the above medical record    Electronically signed by: Celesta Gentile, PA-C, 04/24/2017, 4:10 PM    This note is electronically signed in the electronic medical record.

## 2017-04-24 ENCOUNTER — Telehealth: Payer: Self-pay

## 2017-05-03 ENCOUNTER — Telehealth: Payer: Self-pay

## 2017-05-03 NOTE — Progress Notes (Signed)
Dear RNs:  I called Samantha Cameron today -  [phone].    She is doing well and has no complaints. Pt reports feeling better.      Please contact me with any questions or requests.  I will continue to work with her until 05/19/17.    Jay Schlichter  Transition Facilitator  [phone]

## 2017-05-03 NOTE — Progress Notes (Signed)
Dear RNs:  I called CHIHIRO FREY today -  [phone].    I did the following; [place] left voicemail checking on pt. CCC requesting call back. [place] left contact information.        Please contact me with any questions or requests.  I will continue to work with her until 05/19/17.    Jay Schlichter  Transition Facilitator  [phone]    .

## 2017-05-14 ENCOUNTER — Telehealth (HOSPITAL_BASED_OUTPATIENT_CLINIC_OR_DEPARTMENT_OTHER): Payer: Self-pay

## 2017-05-14 NOTE — Progress Notes (Signed)
[facility] Tracking #:[id number]   Member Name:Riera, [name] I Status:Pending   RequestType:New Form Created:05/14/2017 11:16:00 AM      Facility Location: [facility]:   re, [address] [place], [place], [address] [place], [place], [address]   [phone] [place] within [employer]:- Yes   Requested Service:- PCP   Duration of Services:- 12 MONTHS   Frequency of Services:- 7 visit(s) per [employer]   Wheelchair van needed:- No   Escort accompanying the member:- Yes   Service Animal needed:- No       [facility] Tracking #:[id number]   Member Name:Youtsey, [name] I Status:Pending   RequestType:New Form Created:05/14/2017 11:20:00 AM      Facility Location: Member [employer] Address:   [address] [place], [place], [address] [place], [place], [address]   [phone] [place] within [employer]:- Yes   Requested Service:- Chronic Pain   Duration of Services:- 12 MONTHS   Frequency of Services:- 4 visit(s) per [employer]   Wheelchair van needed:- No   Escort accompanying the member:- Yes   Service Animal needed:- No

## 2017-06-20 ENCOUNTER — Encounter (HOSPITAL_BASED_OUTPATIENT_CLINIC_OR_DEPARTMENT_OTHER): Payer: Self-pay | Admitting: Internal Medicine

## 2017-06-20 ENCOUNTER — Ambulatory Visit (HOSPITAL_BASED_OUTPATIENT_CLINIC_OR_DEPARTMENT_OTHER): Payer: MEDICARE | Admitting: Internal Medicine

## 2017-06-20 VITALS — BP 102/69 | HR 113 | Temp 96.8°F | Wt 226.2 lb

## 2017-06-20 DIAGNOSIS — K219 Gastro-esophageal reflux disease without esophagitis: Secondary | ICD-10-CM

## 2017-06-20 DIAGNOSIS — M940 Chondrocostal junction syndrome [Tietze]: Secondary | ICD-10-CM

## 2017-06-20 MED ORDER — OMEPRAZOLE 20 MG PO CPDR
20.0000 mg | DELAYED_RELEASE_CAPSULE | Freq: Two times a day (BID) | ORAL | 1 refills | Status: AC
Start: 2017-06-20 — End: 2017-07-20

## 2017-06-20 MED ORDER — OMEPRAZOLE 20 MG PO CPDR: 20 mg | capsule | Freq: Two times a day (BID) | ORAL | 1 refills | 0 days | Status: AC

## 2017-06-20 MED ORDER — ACETAMINOPHEN 500 MG PO TABS
500.00 mg | ORAL_TABLET | Freq: Four times a day (QID) | ORAL | 0 refills | Status: AC | PRN
Start: 2017-06-20 — End: 2017-07-20

## 2017-06-20 MED ORDER — RANITIDINE HCL 150 MG PO CAPS: 150 mg | capsule | Freq: Two times a day (BID) | ORAL | 0 refills | 0 days | Status: AC | PRN

## 2017-06-20 MED ORDER — ACETAMINOPHEN 500 MG PO TABS: 500 mg | tablet | Freq: Four times a day (QID) | ORAL | 0 refills | 0 days | Status: AC | PRN

## 2017-06-20 MED ORDER — RANITIDINE HCL 150 MG PO CAPS
150.00 mg | ORAL_CAPSULE | Freq: Two times a day (BID) | ORAL | 0 refills | Status: AC | PRN
Start: 2017-06-20 — End: 2017-07-20

## 2017-06-20 NOTE — Progress Notes (Signed)
Subjective     Samantha Cameron is a 61 year old female with history of DM, COPD, GERD presents for urgent care    Complains of chest pain x 2 week  Started on waking from sleep about 2 weeks ago  Pain was preceded by a bout of diarrhea lasting about 5 days  There most of the time - lets up with distraction  Not worse after a meal  Not worse with exertion  Tried to use inhaler and drink water and took deep breaths 'to calm myself down'  Doing that helps  Feels like there is phlegm  Coughing to try to get phlegm up  + pain with cough    Admitted to hospital in June with chest pain   Thought to be GI origin  Pt attributed this to nsaids from ortho and caffeine  Discharged on omeprazole    Feels like the whole chest pain problem started about 10 years ago  h/o hiatal hernia  EGD 10 years ago   + for h.pylori at the time  treated      Social History    Marital status: Divorced            Spouse name:                       Years of education:                 Number of children:               Social History Main Topics    Smoking status: Current Every Day Smoker                                                     Packs/day: 0.25      Years: 31.00       Smokeless tobacco: Never Used                        Comment: quit smoking inform provided to patient    Alcohol use: No              Drug use: No              Sexual activity: Not Currently     Partners with: Female       Birth control/protection: Tubal Ligation    Social History Narrative    Lives with son    1 daughter and another son passed away    Disabled from back    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) ([place])      Tobacco use disorder     Spinal stenosis of lumbar region     HLD (hyperlipidemia)     Chronic bilateral low back pain without sciatica  Skin change     Pineal gland cyst     History of vitamin D deficiency     Hemorrhoids     Venous (peripheral) insufficiency     Adrenal adenoma     Type 2 diabetes mellitus with microalbuminuria, without long-term current use of insulin (HCC)     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 (HCC)     Acute pain of right wrist     Abnormal PFTs     Screening for malignant neoplasm of breast     Skin irritation     H/O bone density study     Vertigo     Orthostatic hypotension     Social problem     Right arm weakness     Insomnia     Acute pain of left knee     Hx of total knee replacement, left     Seasonal allergic rhinitis     Left foot pain     Stress due to family tension     Gastritis without bleeding     Leucocytosis      Family History    Diabetes Mother     Heart Brother     Comment: CAD and AAA    Hypertension Mother     Arthritis Mother     Stroke Mother     Diabetes Brother     Comment: same brother    Cancer - Other Father     Comment: type uncertain    Thyroid Sister     Alcohol/Drug Abuse Son     OTHER Son     Comment: ?brain aneurysm per steward records    No Known Problems Grandchild     Comment: 6    Cancer - Breast FamHxNeg     Cancer - Cervical FamHxNeg     Cancer - Ovarian FamHxNeg     Cancer - Colon FamHxNeg     Psychiatric Illness FamHxNeg        Current Outpatient Prescriptions:  meclizine (ANTIVERT) 25 MG TABS take 1 tablet by mouth every 8 hours if needed Disp: 60 tablet Rfl: 1   lisinopril (PRINIVIL,ZESTRIL) 20 MG tablet Take 1 tablet by mouth daily Disp: 90 tablet Rfl: 3   fluticasone (FLONASE) 50 MCG/ACT nasal spray 1 spray by Each Nostril route daily Disp: 1 Bottle Rfl: 11   albuterol (PROVENTIL HFA,VENTOLIN HFA, PROAIR HFA) 108 (90 Base) MCG/ACT inhaler Inhale 2 puffs into the lungs every 6 (six) hours as needed for Wheezing or Shortness of  breath Disp: 1 Inhaler Rfl: 11   Lancets Use 1 time daily.  Dispense lancets that are covered by insurance. Dx e11.29 Disp: 100 each Rfl: 11   metFORMIN (GLUCOPHAGE-XR) 500 MG 24 hr tablet Take 4 tablets by mouth daily with breakfast Disp: 120 tablet Rfl: 11   atorvastatin (LIPITOR) 20 MG tablet take 1 tablet by mouth once daily Disp: 90 tablet Rfl: 3   metoprolol (TOPROL-XL) 25 MG 24 hr tablet Take 0.5 tablets by mouth nightly At bedtime Disp: 15 tablet Rfl: 11   glucose blood (ONETOUCH VERIO) test strip by Finger Test or Other route daily Use as instructed Disp: 100 strip Rfl: 11   Omega-3 Fatty Acids (FISH OIL) 1000 MG CAPS Take by mouth Disp:  Rfl:    cholecalciferol (VITAMIN D3) 1000 UNIT tablet Take 1 tablet by mouth daily Disp:  Rfl:    pregabalin (LYRICA) 100 MG capsule Take 100 mg by mouth 2 (two)  times daily With Dr Dwyane Dee Disp:  Rfl:    oxycodone-acetaminophen (PERCOCET) 5-325 MG per tablet Take 1 tablet by mouth 2 (two) times daily as needed With Dr Dwyane Dee Disp:  Rfl:    aspirin 81 MG tablet Take 81 mg by mouth daily. Disp:  Rfl:      No current facility-administered medications for this visit.   Review of Patient's Allergies indicates:   Codeine camsylate       Shortness of Breath, Other (See                            Comments)    Comment:Chest pain   Motrin [ibuprofen]      Nausea Only   Augmentin [amoxicil*    Nausea and Vomiting   Bactrim                 Rash, Itching    Review of Systems   Gastrointestinal: Positive for abdominal pain (lower abdomen), diarrhea (starting about 10 days ago, lasted for 5 days) and nausea. Negative for vomiting.            Objective     BP 102/69  Pulse 113  Temp 96.8 F (36 C)  Wt 102.6 kg (226 lb 3.2 oz)  LMP 10/22/2007  SpO2 98%  BMI 41.37 kg/m2    Physical Exam   Constitutional: No distress.   HENT:   Head: Normocephalic and atraumatic.   Eyes: Conjunctivae are normal.   Cardiovascular: Normal rate, regular rhythm and normal heart sounds.  Exam reveals no gallop and  no friction rub.    No murmur heard.  Pulmonary/Chest: Effort normal. Tenderness: Tenderness bilaterally at the costosternal junctions.   Abdominal: Soft. Tenderness: mild epigastric tenderness.   Neurological: She is alert.   Psychiatric: She has a normal mood and affect.                Plan   Assessment  Costochondritis  Possibly some mild GERD/gastritis, but I think costochondritis is the primary cause of her current symptoms  - acetaminophen scheduled - avoid nsaids because of prior concerns for gastritis  - warm compresses prn  - self-limited nature reviewed (although it can last for months!)    Follow up if symptoms worsening or not improving as expected     We discussed the patient's current medications. The patient expressed understanding and no barriers to adherence were identified.  1. The patient indicates understanding of these issues and agrees with the plan.  Brief care plan is updated and reviewed with the patient.   2. The patient is given an After Visit Summary sheet that lists all medications with directions, allergies, orders placed during this encounter, and follow-up instructions.   3. I reviewed the patient's medical information and medical history   4. I reconciled the patient's medication list and prepared and supplied needed refills.   5. I have reviewed the past medical, family, and social history sections including the medications and allergies.    Devota Pace, MD      No orders of the defined types were placed in this encounter.

## 2017-06-20 NOTE — Patient Instructions (Signed)
Patient Education     Costochondritis  Costochondritis is swelling and irritation (inflammation) of the tissue (cartilage) that connects your ribs to your breastbone (sternum). This causes pain in the front of your chest. The pain usually starts gradually and involves more than one rib.  What are the causes?  The exact cause of this condition is not always known. It results from stress on the cartilage where your ribs attach to your sternum. The cause of this stress could be:  · Chest injury (trauma).  · Exercise or activity, such as lifting.  · Severe coughing.    What increases the risk?  You may be at higher risk for this condition if you:  · Are female.  · Are 30?63 years old.  · Recently started a new exercise or work activity.  · Have low levels of vitamin D.  · Have a condition that makes you cough frequently.    What are the signs or symptoms?  The main symptom of this condition is chest pain. The pain:  · Usually starts gradually and can be sharp or dull.  · Gets worse with deep breathing, coughing, or exercise.  · Gets better with rest.  · May be worse when you press on the sternum-rib connection (tenderness).    How is this diagnosed?  This condition is diagnosed based on your symptoms, medical history, and a physical exam. Your health care provider will check for tenderness when pressing on your sternum. This is the most important finding. You may also have tests to rule out other causes of chest pain. These may include:  · A chest X-ray to check for lung problems.  · An electrocardiogram (ECG) to see if you have a heart problem that could be causing the pain.  · An imaging scan to rule out a chest or rib fracture.    How is this treated?  This condition usually goes away on its own over time. Your health care provider may prescribe an NSAID to reduce pain and inflammation. Your health care provider may also suggest that you:  · Rest and avoid activities that make pain worse.  · Apply heat or cold to the  area to reduce pain and inflammation.  · Do exercises to stretch your chest muscles.    If these treatments do not help, your health care provider may inject a numbing medicine at the sternum-rib connection to help relieve the pain.  Follow these instructions at home:  · Avoid activities that make pain worse. This includes any activities that use chest, abdominal, and side muscles.  · If directed, put ice on the painful area:  ? Put ice in a plastic bag.  ? Place a towel between your skin and the bag.  ? Leave the ice on for 20 minutes, 2-3 times a day.  · If directed, apply heat to the affected area as often as told by your health care provider. Use the heat source that your health care provider recommends, such as a moist heat pack or a heating pad.  ? Place a towel between your skin and the heat source.  ? Leave the heat on for 20-30 minutes.  ? Remove the heat if your skin turns bright red. This is especially important if you are unable to feel pain, heat, or cold. You may have a greater risk of getting burned.  · Take over-the-counter and prescription medicines only as told by your health care provider.  · Return to your normal   activities as told by your health care provider. Ask your health care provider what activities are safe for you.  · Keep all follow-up visits as told by your health care provider. This is important.  Contact a health care provider if:  · You have chills or a fever.  · Your pain does not go away or it gets worse.  · You have a cough that does not go away (is persistent).  Get help right away if:  · You have shortness of breath.  This information is not intended to replace advice given to you by your health care provider. Make sure you discuss any questions you have with your health care provider.  Document Released: 08/15/2005 Document Revised: 05/25/2016 Document Reviewed: 02/29/2016  Elsevier Interactive Patient Education © 2017 [employer]

## 2017-07-10 ENCOUNTER — Ambulatory Visit (HOSPITAL_BASED_OUTPATIENT_CLINIC_OR_DEPARTMENT_OTHER): Payer: MEDICARE | Admitting: Physician Assistant

## 2017-07-14 ENCOUNTER — Other Ambulatory Visit (HOSPITAL_BASED_OUTPATIENT_CLINIC_OR_DEPARTMENT_OTHER): Payer: Self-pay | Admitting: Internal Medicine

## 2017-07-15 NOTE — Progress Notes (Signed)
[facility], AMBRIELLE KINGTON is a 61 year old female has requested a refill of One Touch Verio test strips.      Last Office Visit: 06/20/17 with Roll, D  Last Physical Exam: 08/15/15      Diabetic Med:   HEMOGLOBIN A1C (%)   Date Value   04/10/2017 6.0 (H)   ----------    No results found for: POCA1C   CREATININE (mg/dL)   Date Value   04/19/2017 0.8   ----------    Documented patient preferred pharmacies:    [facility] [place], [place]  Phone: [phone] Fax: [phone]

## 2017-07-16 ENCOUNTER — Ambulatory Visit (HOSPITAL_BASED_OUTPATIENT_CLINIC_OR_DEPARTMENT_OTHER): Payer: MEDICARE | Admitting: Physician Assistant

## 2017-07-16 DIAGNOSIS — M7052 Other bursitis of knee, left knee: Secondary | ICD-10-CM

## 2017-07-16 MED ORDER — METHYLPREDNISOLONE ACETATE 40 MG/ML IJ SUSP: 40 mg | mL | Freq: Once | INTRAMUSCULAR | 0 refills | 0 days | Status: AC

## 2017-07-16 MED ORDER — METHYLPREDNISOLONE ACETATE 40 MG/ML IJ SUSP
40.00 mg | Freq: Once | INTRAMUSCULAR | 0 refills | Status: AC
Start: 2017-07-16 — End: 2017-07-16

## 2017-07-16 NOTE — Progress Notes (Signed)
Orthopedic Office Note    CC: Patient presents with:  Follow Up: Bilateral Knee Pain L>R, LTKA x 7 yrs, Right knee pain.      ORTHOPEDIC PROBLEM LIST:  1.  Status post left TKA 6-7 years ago Dr. Shirlean Mylar [place]  2.  Right knee osteoarthritis  3.  Pes bursitis left knee    HPI: Samantha Cameron is a 61 year old female who presents for follow-up evaluation of bilateral knee pain.  Patient reports having left TKA 6 7 years ago with Dr. Shirlean Mylar at [facility].  She states she did well after surgery however began having pain in March 2018.  She denies any injury.  She states she suddenly could not walk.  She describes pain along the medial and lateral aspect of the joint.    She states [facility] manage her back pain.  She is feeling somewhat better since her last visit, but over the last few weeks her pain increased again.  She cannot take meloxicam as she had a panic attack last time she developed.  She describes pain throughout the knee and a localized area in the area of the pes bursa.  Denies any numbness or tingling distally.    ROS: Review of systems filled out by patient and scanned into the medical record. Reviewed by me and all other systems are negative except as noted in HPI.    PMH: Past Medical History:  No date: 1st MTP arthritis      Comment: per [facility] records, xray 11/2011  02/16/2017: Acute bacterial sinusitis  09/14/2015: Acute nonintractable headache  No date: Arthritis  No date: Back pain  No date: Bilateral knee pain      Comment: per [facility] records, mri - see scanned - tear                meniscus right, politeal cyst, mcl sprain                06/2014, s/p left knee replacement. 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  No date: Esophageal reflux  No date: External hemorrhoid      Comment: per [facility] records  No date: HTN (hypertension)  02/01/2012: Hypoxia  02/16/2017: Left ear pain  06/07/2015: Left genital labial abscess  06/16/2015: Obesity, Class III, 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*  08/16/2015: Osteopenia      Comment: On xray of left knee per [facility] records                06/2014 Unclear if addressed  09/14/2015: Right ear impacted cerumen  08/15/2015: Type 2 diabetes mellitus without complication,*      Comment:  HEMOGLOBIN A1C (%) Date Value 07/15/2015 6.2                (H) ----------  [facility] record - a1c 6.2 on                01/31/15 6.4 on 09/30/14 5.9 on 12/29/13 6.4  08/18/13     Surgical HX: Past Surgical History:  No date: ANES NERVE MUSC TENDON FASCIA&BURSA KNEE&/POPLT  No date: FOOT SURGERY      Comment: bilateral  No date: LAPAROSCOPY SURG CHOLECYSTECTOMY  No date: OB ANTEPARTUM CARE CESAREAN DLVR & POSTPARTUM      Comment: x3  No date: TONSILLECTOMY & ADENOIDECTOMY <AGE 62  04/13/2009: TOTAL KNEE REPLACEMENT      Comment: left  08/11/2009: WRIST GANGLION EXCISION      Comment: left     SH:     Social History  Social History   Marital status: Divorced  Spouse name: N/A    Years of education: N/A  Number of children: N/A     Occupational History  None on file     Social History Main Topics   Smoking status: Current Every Day Smoker  0.25 Packs/day  For 31.00 Years     Smokeless tobacco: Never Used    Comment: quit smoking inform provided to patient    Alcohol use No    Drug use: No    Sexual activity: Not Currently    Partners: Male    Birth control/ protection: Tubal Ligation     Other Topics Concern   None on file     Social History Narrative    Lives with son    1 daughter and another son passed away    Disabled from back    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 Simons,MD, 07/15/2015, 2:56 PM           Allergies: Review of Patient's Allergies indicates:   Codeine camsylate       Shortness of Breath, Other (See                            Comments)    Comment:Chest pain   Motrin [ibuprofen]      Nausea Only   Augmentin [amoxicil*    Nausea and Vomiting   Bactrim                 Rash, Itching    Current Medications:   Current Outpatient Prescriptions:     omeprazole (PRILOSEC) 20 MG capsule, Take 1 capsule by mouth 2 (two) times daily For 60 days, Disp: 60 capsule, Rfl: 1    ranitidine (ZANTAC) 150 MG capsule, Take 1 capsule by mouth 2 (two) times daily as needed for Heartburn, Disp: 60 capsule, Rfl: 0    lisinopril (PRINIVIL,ZESTRIL) 20 MG tablet, Take 1 tablet by mouth daily, Disp: 90 tablet, Rfl: 3    fluticasone (FLONASE) 50 MCG/ACT nasal spray, 1 spray by Each Nostril route daily, Disp: 1 Bottle, Rfl: 11    metFORMIN (GLUCOPHAGE-XR) 500 MG 24 hr tablet, Take 4 tablets by mouth daily with breakfast, Disp: 120 tablet, Rfl: 11    atorvastatin (LIPITOR) 20 MG tablet, take 1 tablet by mouth once daily, Disp: 90 tablet, Rfl: 3    metoprolol (TOPROL-XL) 25 MG 24 hr tablet, Take 0.5 tablets by mouth nightly At bedtime, Disp: 15 tablet, Rfl: 11    Omega-3 Fatty Acids (FISH OIL) 1000 MG CAPS,  Take by mouth, Disp: , Rfl:     cholecalciferol (VITAMIN D3) 1000 UNIT tablet, Take 1 tablet by mouth daily, Disp: , Rfl:     pregabalin (LYRICA) 100 MG capsule, Take 100 mg by mouth 2 (two) times daily With Dr Dwyane Dee, Disp: , Rfl:     oxycodone-acetaminophen (PERCOCET) 5-325 MG per tablet, Take 1 tablet by mouth 2 (two) times daily as needed With Dr Dwyane Dee, Disp: , Rfl:     aspirin 81 MG tablet, Take 81 mg by mouth daily., Disp: , Rfl:     ONETOUCH VERIO test strip, TEST once daily, Disp: 100 strip, Rfl: 11     acetaminophen (TYLENOL) 500 MG tablet, Take 1 tablet by mouth every 6 (six) hours as needed for Pain, Disp: 90 tablet, Rfl: 0    albuterol (PROVENTIL HFA,VENTOLIN HFA, PROAIR HFA) 108 (90 Base) MCG/ACT inhaler, Inhale 2 puffs into the lungs every 6 (six) hours as needed for Wheezing or Shortness of breath, Disp: 1 Inhaler, Rfl: 11    Lancets, Use 1 time daily.  Dispense lancets that are covered by insurance. Dx e11.29, Disp: 100 each, Rfl: 11    IMAGING: x-ray of the left knee reveals total knee cruciate retaining prosthesis in good alignment without evidence of hardware loosening or complication.  Right knee shows arthritis with joint line n medial compartment narrowing.  Imaging was reviewed by myself and Dr. Kennyth Lose during this visit.     PHYSICAL EXAM:  GENERAL: Alert and oriented, in no acute distress  MOOD: Appropriate.   MUSCULOSKELETAL: Evaluation of left knee reveals no gross bony deformities.  Anterior incision to the left knee is well-healed.  Her skin is warm, dry and intact. There is no erythema, edema, ecchymosis bilaterally.  She has no effusion bilaterally.  She is tender to palpation along the medial and lateral aspect of the left knee, and especially tender over the pes bursa. Ligaments are stable to varus and valgus stress.  ACL and PCL are stable on exam bilateral knees.  ROM 0-110 degrees. She is neurovascularly intact distally. +2 pedal pulses    ASSESSMENT/PLAN: 61 year old female status post left TKA 6-7 years ago, on room.  She began having pain in the left knee suddenly in March 2018.  This is most likely due to synovitis.  She tried Mobic after last visit but had a panic attack after taking feels his allergic reaction.  She refuses NSAIDs at this time.  While the majority of her pain may come from the synovitis, she does appear to have has bursitis as well.  Since she cannot take NSAIDs since she received an intra-articular injection, she requests a.  Corticosteroid Injection for the Pes  Bursa.   After timeout, using sterile technique, 40 mg of depo-medrol mixed with 2 cc of 1% Xylocaine was injected into the area of the pes bursa. The patient tolerated this well and there were no complications.  Offered follow-up however she prefers to follow-up on an as-needed basis if her symptoms worsen or do not improve.    Over 30 minutes was spent with the patient during the encounter, with more than 50% of time spent on counseling regarding likely diagnosis, the prognosis, and various treatment options in detail. Risks and benefits of treatment plan discussed. The patient's questions have been answered, and the patient understands agrees with treatment plan.     Dr. Kennyth Lose saw and examined the patient and formulated the assessment and plan.    This note was  prepared using voice recognition software. Please disregard any transcription errors.    Leanord Hawking, PA-C, 07/16/2017, 1:58 PM        Nursing Communication:    ___ Mena Goes:   ___ XOA in splint/cast  ___ XOA out of splint/cast  ___ Cast removal   ___ MRI/CT review  ___ Surgical booking (vitals)  _x__ None

## 2017-07-23 ENCOUNTER — Telehealth (HOSPITAL_BASED_OUTPATIENT_CLINIC_OR_DEPARTMENT_OTHER): Payer: Self-pay | Admitting: Internal Medicine

## 2017-07-24 ENCOUNTER — Other Ambulatory Visit (HOSPITAL_BASED_OUTPATIENT_CLINIC_OR_DEPARTMENT_OTHER): Payer: Self-pay | Admitting: Internal Medicine

## 2017-07-24 DIAGNOSIS — R000 Tachycardia, unspecified: Secondary | ICD-10-CM

## 2017-07-24 NOTE — Progress Notes (Signed)
PER Pharmacy, Samantha Cameron is a 61 year old female has requested a refill of metoprolol.      Last Office Visit: 06/20/2017 with Roll, D  Last Physical Exam: 08/15/2015      Other Med Adult:  Most Recent BP Reading(s)  06/20/17 : 102/69          Cholesterol (mg/dL)   Date Value   08/06/2016 143   ----------    LOW DENSITY LIPOPROTEIN DIRECT (mg/dL)   Date Value   08/06/2016 67   ----------    HIGH DENSITY LIPOPROTEIN (mg/dL)   Date Value   08/06/2016 44   ----------    TRIGLYCERIDES (mg/dl)   Date Value   02/08/2009 189 (H)   ----------        THYROID SCREEN TSH REFLEX FT4 (uIU/mL)   Date Value   04/12/2016 1.260   ----------      No results found for: TSH      HEMOGLOBIN A1C (%)   Date Value   04/10/2017 6.0 (H)   ----------    No results found for: POCA1C        INR (no units)   Date Value   04/18/2017 1.0   05/27/2016 1.0   02/01/2012 < 1.0 (L)   ----------      SODIUM (mmol/L)   Date Value   04/19/2017 143   ----------      POTASSIUM (mmol/L)   Date Value   04/19/2017 4.3   ----------          CREATININE (mg/dL)   Date Value   04/19/2017 0.8   ----------    Documented patient preferred pharmacies:    [facility] [place], [place]  Phone: [phone] Fax: [phone]

## 2017-07-27 ENCOUNTER — Telehealth (HOSPITAL_BASED_OUTPATIENT_CLINIC_OR_DEPARTMENT_OTHER): Payer: Self-pay

## 2017-07-27 NOTE — Progress Notes (Signed)
DWO Form has been complete and scanned into Media Manager, DR Beaulah Dinning, need to sig/Date form and need to be fax back to [facility] fax number [phone]    Thank you

## 2017-07-27 NOTE — Telephone Encounter (Signed)
-----   Message from Waylan Rocher sent at 07/27/2017 12:44 PM EDT -----  Regarding: DWO   [place]    Person calling on behalf of patient: Patient (self)    May list multiple medications in this section    Medicine Name: [place]    Is completley out

## 2017-07-29 NOTE — Progress Notes (Signed)
Printed, Placed on cart to be delivered and signed

## 2017-07-30 NOTE — Progress Notes (Signed)
Form completed. To be faxed.

## 2017-07-31 ENCOUNTER — Ambulatory Visit (HOSPITAL_BASED_OUTPATIENT_CLINIC_OR_DEPARTMENT_OTHER): Payer: MEDICARE | Admitting: Internal Medicine

## 2017-07-31 ENCOUNTER — Encounter (HOSPITAL_BASED_OUTPATIENT_CLINIC_OR_DEPARTMENT_OTHER): Payer: Self-pay | Admitting: Internal Medicine

## 2017-07-31 VITALS — BP 121/78 | HR 112 | Temp 98.0°F | Wt 228.0 lb

## 2017-07-31 DIAGNOSIS — J019 Acute sinusitis, unspecified: Secondary | ICD-10-CM

## 2017-07-31 DIAGNOSIS — E1129 Type 2 diabetes mellitus with other diabetic kidney complication: Secondary | ICD-10-CM

## 2017-07-31 DIAGNOSIS — B9689 Other specified bacterial agents as the cause of diseases classified elsewhere: Secondary | ICD-10-CM

## 2017-07-31 DIAGNOSIS — R809 Proteinuria, unspecified: Secondary | ICD-10-CM

## 2017-07-31 LAB — MICROALBUMIN RANDOM URINE
ALB/CREAT RATIO URINE RAN: 487 ug/mg (ref 0–30)
ALBUMIN URINE RANDOM: 14.9 mg/dL — ABNORMAL HIGH (ref 0.0–1.9)
CREATININE RANDOM URINE: 31 mg/dl

## 2017-07-31 MED ORDER — DOXYCYCLINE HYCLATE 100 MG PO TABS: 100 mg | tablet | Freq: Two times a day (BID) | ORAL | 0 refills | 0 days | Status: AC

## 2017-07-31 MED ORDER — DOXYCYCLINE HYCLATE 100 MG PO TABS
100.00 mg | ORAL_TABLET | Freq: Two times a day (BID) | ORAL | 0 refills | Status: AC
Start: 2017-07-31 — End: 2017-08-07

## 2017-07-31 NOTE — Progress Notes (Signed)
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) ([place])     Tobacco use disorder     Spinal stenosis of lumbar region     HLD (hyperlipidemia)     Chronic bilateral low back pain without sciatica     Skin change     Pineal gland cyst     History of vitamin D deficiency     Hemorrhoids     Venous (peripheral) insufficiency     Adrenal adenoma     Type 2 diabetes mellitus with microalbuminuria, without long-term current use of insulin (HCC)     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 (HCC)     Acute pain of right wrist     Abnormal PFTs     Screening for malignant neoplasm of breast     Skin irritation     H/O bone density study     Vertigo     Orthostatic hypotension     Social problem     Right arm weakness     Insomnia     Acute pain of left knee     Hx of total knee replacement, left     Seasonal allergic rhinitis     Left foot pain     Stress due to family tension     Gastritis without bleeding     Leucocytosis        DM   Currently taking Metformin 500 mg bid -taken at 6 pm every day  No numbness, tingling, diaphoresis, shakiness or dizziness   Was unable to check her sugar levels in the last 9 days since "I did not have the strips" - recently obtained however.   Upcoming ophthalmologist on 08/22/17   HEMOGLOBIN A1C (%)   Date Value   04/10/2017 6.0 (H)   01/04/2017 6.1 (H)   08/06/2016 6.5 (H)     AWQ- negative     Most Recent BP Reading(s)  04/19/17 : 103/71  09/15/16 : 121/84  05/28/16 : 116/76  11/14/15 : 112/74  06/09/15 : 122/82    Most Recent Weight Reading(s)  04/19/17 : 101.2 kg (223 lb)  09/15/16 : 99.8 kg (220 lb)  11/14/15 : 106.6 kg (235 lb)  06/07/15 : 106.2 kg (234 lb 1.6 oz)  06/04/15 : 105.7 kg (233 lb)    Sinus infection   Notes sneezing for the past 1.5 weeks   Pt reports facial and head tenderness "my whole head is painful"   Notes the pain is a 9/10  No fever   No bloody  mucus discharge   Notes experiencing similar sx in the past "but not as bad"   Has ben using Flonase - not helping     L knee pain   Notes completing injection in L knee on 07/16/17   Pt reports pain is still occurring since the injection     Vaccines reconciliation   Completed shingles shot and flu shot at [facility] - form photocopied     PE:  BP 121/78  Pulse 112  Temp 98 F (36.7 C) (Temporal)  Wt 103.4 kg (228 lb)  LMP 10/22/2007  SpO2 97%  BMI 41.7 kg/m2  Estimated body mass index is 41.7 kg/m as calculated from the following:    Height as of 04/19/17: 5\' 2"  (1.575 m).    Weight as  of this encounter: 103.4 kg (228 lb).  Gen - Morbidly obese middle aged female upright in chair in NAD, wearing glasses.   HEENT - MMM. Bilateral maxillary TTP, no swollen lymph nodes.   CV - RRR, no m/r/g  Resp - CTAB  Ext - Warm, dry. Normal sensation by monofilament bilaterally. Red marks on second toes bilaterally   Neuro - A+Ox3  Psych - Appropriate    A/P:  (E11.29,  R80.9) Type 2 diabetes mellitus with microalbuminuria, without long-term current use of insulin (HCC)  Comment: Controlled  Plan:  MICROALBUMIN RANDOM URINE, COLLECTION VENOUS         BLOOD VENIPUNCTURE, HEMOGLOBIN A1C, BASIC         METABOLIC PANEL  Continue with current medication regimen   Follow up with ophthalmologist  on 08/23/27     (E66.01) 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 (HCC)  Comment: By BMI  Plan: Discussed portion control, healthy food choice, exercise    (J01.90,  B96.89) Acute bacterial sinusitis  Comment: Based on length and severity of sx, concern for bacterial  Plan: doxycycline (VIBRA-TABS) 100 MG tablet  Advised taking doxycycline 100 mg bid for 7 days.   Discussed possible side effects on doxycycline such as drowsiness, dizziness, HA, stomach upset, rash and if occurring and becoming worrisome, call the clinic.   Discussed performing nasal saline rinses and humidified air from shower.   Advised applying facial heat packs prn    If sx do not improve within the next few days, call the clinic.     F/u DM in 3 months     By signing my name below, I, Lemar Lofty, attest that this documentation has been prepared under the direction and in the presence of Osa Craver, M.D.  Electronically Signed:  Lemar Lofty, Medical Scribe. 07/31/17 3:25 PM    I, Osa Craver, personally performed the services described in this documentation. All medical record entries made by the scribe were at my direction and in my presence. I have reviewed the chart and discharge instructions (if applicable) and agree that the record reflects my personal performance and is accurate and complete.   Andria Head P. Keano Guggenheim,MD, 08/18/2017, 7:26 PM

## 2017-08-14 ENCOUNTER — Ambulatory Visit (HOSPITAL_BASED_OUTPATIENT_CLINIC_OR_DEPARTMENT_OTHER): Payer: MEDICARE | Admitting: Physician Assistant

## 2017-08-14 ENCOUNTER — Telehealth (HOSPITAL_BASED_OUTPATIENT_CLINIC_OR_DEPARTMENT_OTHER): Payer: Self-pay

## 2017-08-14 VITALS — BP 111/73 | HR 103 | Temp 96.6°F | Wt 231.0 lb

## 2017-08-14 DIAGNOSIS — R51 Headache: Secondary | ICD-10-CM

## 2017-08-14 DIAGNOSIS — J019 Acute sinusitis, unspecified: Secondary | ICD-10-CM

## 2017-08-14 DIAGNOSIS — R519 Headache, unspecified: Secondary | ICD-10-CM

## 2017-08-14 MED ORDER — BUTALBITAL-APAP-CAFFEINE 50-325-40 MG PO TABS
1.00 | ORAL_TABLET | Freq: Four times a day (QID) | ORAL | 0 refills | Status: AC | PRN
Start: 2017-08-14 — End: 2017-08-19

## 2017-08-14 MED ORDER — PSEUDOEPHEDRINE HCL ER 120 MG PO TB12
120.00 mg | ORAL_TABLET | Freq: Two times a day (BID) | ORAL | 0 refills | Status: AC
Start: 2017-08-14 — End: 2017-08-21

## 2017-08-14 MED ORDER — PSEUDOEPHEDRINE HCL ER 120 MG PO TB12: 120 mg | tablet | Freq: Two times a day (BID) | ORAL | 0 refills | 0 days | Status: AC

## 2017-08-14 MED ORDER — BUTALBITAL-APAP-CAFFEINE 50-325-40 MG PO TABS: 1 | tablet | Freq: Four times a day (QID) | ORAL | 0 refills | 0 days | Status: AC | PRN

## 2017-08-14 NOTE — Progress Notes (Signed)
Samantha Cameron is a 61 year old female patient of Christopher P. Simons,MD presenting for follow up    Sinusitis   Treated for bacterial infection with doxycycline 100 mg bid x 7 days from 09/12 to 09/19.   Patient completed antibiotic  Reports symptoms improved, but not fully resolved  Reports she continues to sinus pressure, frontal headaches  No fever  Patient reports hx of migraine   Reports she took left over Fioricet which helped with the pain.   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) ([place])     Tobacco use disorder     Spinal stenosis of lumbar region     HLD (hyperlipidemia)     Chronic bilateral low back pain without sciatica     Skin change     Pineal gland cyst     History of vitamin D deficiency     Hemorrhoids     Venous (peripheral) insufficiency     Adrenal adenoma     Type 2 diabetes mellitus with microalbuminuria, without long-term current use of insulin (HCC)     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 (HCC)     Acute pain of right wrist     Abnormal PFTs     Screening for malignant neoplasm of breast     Skin irritation     H/O bone density study     Vertigo     Orthostatic hypotension     Social problem     Right arm weakness     Insomnia     Acute pain of left knee     Hx of total knee replacement, left     Seasonal allergic rhinitis     Left foot pain     Stress due to family tension     Gastritis without bleeding     Leucocytosis      Current Outpatient Medications on File Prior to Visit:  metoprolol (TOPROL-XL) 25 MG 24 hr tablet take 1/2 tablet by mouth at bedtime Disp: 45 tablet Rfl: 3   ONETOUCH VERIO test strip TEST once daily Disp: 100 strip Rfl: 11   lisinopril (PRINIVIL,ZESTRIL) 20 MG tablet Take 1 tablet by mouth daily Disp: 90 tablet Rfl: 3   fluticasone (FLONASE) 50 MCG/ACT nasal spray 1 spray by Each Nostril route daily Disp: 1 Bottle Rfl: 11   albuterol (PROVENTIL HFA,VENTOLIN HFA,  PROAIR HFA) 108 (90 Base) MCG/ACT inhaler Inhale 2 puffs into the lungs every 6 (six) hours as needed for Wheezing or Shortness of breath Disp: 1 Inhaler Rfl: 11   Lancets Use 1 time daily.  Dispense lancets that are covered by insurance. Dx e11.29 Disp: 100 each Rfl: 11   metFORMIN (GLUCOPHAGE-XR) 500 MG 24 hr tablet Take 4 tablets by mouth daily with breakfast Disp: 120 tablet Rfl: 11   atorvastatin (LIPITOR) 20 MG tablet take 1 tablet by mouth once daily Disp: 90 tablet Rfl: 3   Omega-3 Fatty Acids (FISH OIL) 1000 MG CAPS Take by mouth Disp:  Rfl:    cholecalciferol (VITAMIN D3) 1000 UNIT tablet Take 1 tablet by mouth daily Disp:  Rfl:    pregabalin (LYRICA) 100 MG capsule Take 100 mg by mouth 2 (two) times daily With Dr Dwyane Dee Disp:  Rfl:    oxycodone-acetaminophen (PERCOCET) 5-325 MG per tablet Take 1 tablet by mouth 2 (two) times daily as  needed With Dr Dwyane Dee Disp:  Rfl:    aspirin 81 MG tablet Take 81 mg by mouth daily. Disp:  Rfl:      No current facility-administered medications on file prior to visit.            Review of Systems   Constitutional: Negative for chills and fever.   HENT: Positive for congestion.    Neurological: Positive for headaches.     OBJECTIVE  BP 111/73 (Site: LA, Position: Sitting, Cuff Size: Lrg)  Pulse 103  Temp 96.6 F (35.9 C) (Temporal)  Wt 104.8 kg (231 lb)  LMP 10/22/2007  SpO2 95%  BMI 42.25 kg/m2    Physical Exam   Constitutional: She is oriented to person, place, and time.   HENT:   Nose: Rhinorrhea present. Right sinus exhibits no maxillary sinus tenderness and no frontal sinus tenderness. Left sinus exhibits no maxillary sinus tenderness and no frontal sinus tenderness.   Mouth/Throat: Oropharynx is clear and moist. No oropharyngeal exudate.   Cardiovascular: Normal rate and regular rhythm.   Pulmonary/Chest: Effort normal and breath sounds normal. No respiratory distress.   Neurological: She is alert and oriented to person, place, and time.   Psychiatric: Judgment and  thought content normal.       ASSESSMENT/PLAN:    (J01.90) Acute sinusitis, recurrence not specified, unspecified location  (primary encounter diagnosis)  (R51) Nonintractable headache, unspecified chronicity pattern, unspecified headache type  Comment: some improvement after course of doxycyline, but symptoms not fully resolved. She is also with headache that feels similar to past migraines.   Plan:   Trial of sudafed for congestion  Short term use of Fioricet for headache given reported relief with medicine.   Follow up if symptoms not fully resolved.       1. The patient indicates understanding of these issues and agrees with the plan.  2.  The patient is given an After Visit Summary sheet that lists all of their medications with directions, their allergies, orders placed during this encounter, immunization dates, and follow- up instructions.  3. I reviewed the patient's medical information and medical history   4.  I reconciled the patient's medication list and prepared and supplied needed refills.  5.  I have reviewed the past medical, family, and social history sections including the medications and allergies listed in the above medical record    Electronically signed by: Celesta Gentile, PA-C, 08/14/2017, 7:54 PM    This note is electronically signed in the electronic medical record.

## 2017-08-14 NOTE — Progress Notes (Signed)
Treater Name:  [place]  Tracking #: [id number]    Member Name:  Samantha Cameron, Samantha Cameron  Status: Pending    RequestType:  Renewal  Created: 08/14/2017 8:17:00 PM       Facility Location:  Member Pickup Address:    [facility] [address]    [place], [place], [address]  [place], [place], [address]    [phone]  [place] within [place]:  - Yes   Requested Service:  - Opthalmology   Duration of Services:  - 12 MONTHS   Frequency of Services:  - 1 visit(s) per WEEK   Wheelchair Lucianne Lei needed:  - No   Escort accompanying the member:  - No   Service Animal needed: - No         Treater Name:  [facility]  Tracking #: [id number]    Member Name:  Samantha Cameron, Samantha Cameron  Status: Pending    RequestType:  Renewal  Created: 08/14/2017 8:20:00 PM       Facility Location:  Member Pickup Address:    [address]  [address]    [place], [place], [address]  [place], [place], [address]    [phone]  [place] within [place]:  - Yes   Requested Service:  - Radiology   Duration of Services:  - 12 MONTHS   Frequency of Services:  - 1 visit(s) per [place] needed:  - No   Escort accompanying the member:  - No   Service Animal needed: - No

## 2017-08-22 ENCOUNTER — Ambulatory Visit (HOSPITAL_BASED_OUTPATIENT_CLINIC_OR_DEPARTMENT_OTHER): Payer: MEDICARE | Admitting: Ophthalmology

## 2017-08-22 DIAGNOSIS — H5213 Myopia, bilateral: Secondary | ICD-10-CM

## 2017-08-22 DIAGNOSIS — E119 Type 2 diabetes mellitus without complications: Secondary | ICD-10-CM

## 2017-08-22 DIAGNOSIS — H524 Presbyopia: Secondary | ICD-10-CM

## 2017-08-22 DIAGNOSIS — H52203 Unspecified astigmatism, bilateral: Secondary | ICD-10-CM

## 2017-08-22 MED ORDER — BACITRACIN 500 UNIT/GM OP OINT
TOPICAL_OINTMENT | Freq: Every evening | OPHTHALMIC | 1 refills | Status: AC
Start: 2017-08-22 — End: 2017-09-21

## 2017-08-22 MED ORDER — MOXIFLOXACIN HCL 0.5 % OP SOLN
1.00 [drp] | OPHTHALMIC | 3 refills | Status: AC
Start: 2017-08-22 — End: 2017-09-12

## 2017-08-22 MED ORDER — BACITRACIN 500 UNIT/GM OP OINT: g | Freq: Every evening | OPHTHALMIC | 1 refills | 0 days | Status: AC

## 2017-08-22 MED ORDER — MOXIFLOXACIN HCL 0.5 % OP SOLN: 1 [drp] | mL | OPHTHALMIC | 3 refills | 0 days | Status: AC

## 2017-08-22 NOTE — Progress Notes (Signed)
Impression,    1. 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    2. Marginal corneal infiltrate/ulcer OS-    Plan-Vigamox q 1 hour while awake and Bacitracin ung qhs    Patient instructed to f/u immediately if symptoms wors/3-4 days.    3. Myopia, astigmatism, presbyopia-she was refracted and given a prescription to get glasses that she can fill at any optical shop.

## 2017-08-22 NOTE — Progress Notes (Signed)
For diabetic eye exam to evaluate for retinopathy    HEMOGLOBIN A1C (%)       Date                     Value                 04/10/2017               6.0 (H)               01/04/2017               6.1 (H)               08/06/2016               6.5 (H)          ----------    No results found for: POCA1C

## 2017-08-22 NOTE — Progress Notes (Signed)
61 year old female Here for Diabetic  Eye Exam OU and Evaluation of Foreign body OS     Patient States:  (-) Vision Changes   (-) Flashes   (-) Floaters  (-) Shadow  (-) Eye Pain   (-) Redness   (+) Photophobia  OS X 5 days   (+) Discomfort " feels FB sensation OS with watery eye OS X  5 days   (-) Ocular trauma   (+) Diabetic  DX approximately  5 Year ago     HEMOGLOBIN A1C (%)       Date                     Value                 04/10/2017               6.0 (H)               01/04/2017               6.1 (H)               08/06/2016               6.5 (H)          ----------  Ocular History:  DM Type II   Myopia with Astigmatism OU   Presbyopia OU     Ocular Medications:  None

## 2017-08-27 ENCOUNTER — Ambulatory Visit (HOSPITAL_BASED_OUTPATIENT_CLINIC_OR_DEPARTMENT_OTHER): Payer: MEDICARE | Admitting: Ophthalmology

## 2017-08-27 ENCOUNTER — Encounter (HOSPITAL_BASED_OUTPATIENT_CLINIC_OR_DEPARTMENT_OTHER): Payer: Self-pay | Admitting: Ophthalmology

## 2017-08-27 DIAGNOSIS — H16002 Unspecified corneal ulcer, left eye: Secondary | ICD-10-CM

## 2017-08-27 MED ORDER — PREDNISOLONE ACETATE 1 % OP SUSP
1.0000 [drp] | Freq: Every day | OPHTHALMIC | 1 refills | Status: DC
Start: 2017-08-27 — End: 2017-09-19

## 2017-08-27 MED ORDER — PREDNISOLONE ACETATE 1 % OP SUSP: 1 [drp] | mL | Freq: Every day | OPHTHALMIC | 1 refills | 0 days | Status: DC

## 2017-08-27 NOTE — Progress Notes (Signed)
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

## 2017-08-27 NOTE — Progress Notes (Signed)
Impression,    1. 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

## 2017-08-27 NOTE — Progress Notes (Signed)
61 year old female  F/u,    For Marginal corneal infiltrate/ulcer OS    still feeling hurt,burning,watery/discomfort   OS,      Ocular meds:  Vigamox q 1 hour LD 9 am  Bacitracin ung qhs last night

## 2017-08-30 ENCOUNTER — Ambulatory Visit (HOSPITAL_BASED_OUTPATIENT_CLINIC_OR_DEPARTMENT_OTHER): Payer: MEDICARE | Admitting: Ophthalmology

## 2017-08-30 DIAGNOSIS — H16042 Marginal corneal ulcer, left eye: Secondary | ICD-10-CM

## 2017-08-30 NOTE — Progress Notes (Signed)
Pt here for 3 day fu corneal ulcer OS.    Feels a little better at times.    Still pain (5).  Feels like something in the eye.

## 2017-08-30 NOTE — Progress Notes (Signed)
F/u to evaluate marginal corneal ulcer OS.

## 2017-08-30 NOTE — Progress Notes (Signed)
Impression,    1. Marginal corneal lcer-    Still had some overlying punctate staining.    Plan-decrease Vigamox to 6 times per day  Continue Bacitracin ung qhs  Increase Pred forte to BID.    F/u 1 week/prn worsening symptoms.

## 2017-09-05 ENCOUNTER — Ambulatory Visit (HOSPITAL_BASED_OUTPATIENT_CLINIC_OR_DEPARTMENT_OTHER): Payer: MEDICARE | Admitting: Ophthalmology

## 2017-09-05 DIAGNOSIS — H11122 Conjunctival concretions, left eye: Secondary | ICD-10-CM

## 2017-09-05 DIAGNOSIS — H169 Unspecified keratitis: Secondary | ICD-10-CM

## 2017-09-05 NOTE — Progress Notes (Signed)
67 years / F  -  1 week f/u - Marginal corneal ulcer - OS    Pt using eye drops as prescribed.  Vigamox OS 6 time/day  Pred forte OS 2 times/day   Bacitracin OS at bedtime - last PM    Pt states her left eye still feels like "something in it"  Extreme Light/sun sensitive - OS  Water, constant tearing OS    No VA problem CC.  No changes.      No pain, No headache.  No flashes or floaters.    HEMOGLOBIN A1C (%)   Date Value   04/10/2017 6.0 (H)   01/04/2017 6.1 (H)   08/06/2016 6.5 (H)

## 2017-09-05 NOTE — Progress Notes (Signed)
Impression,    1.Persistent keratitis OD    Topical steroid tx. Did not improve lesion.    Lid eversion revealed several very large palpebral  conjunctival concretions with coarse material which would rub cornea in the abnormal area.  These were removed with jeweler forcepPlan-CCM.    F/u 1 week to reevaluate peripheral corneal lesion.

## 2017-09-05 NOTE — Progress Notes (Signed)
F/u marginal keratitis

## 2017-09-11 ENCOUNTER — Other Ambulatory Visit (HOSPITAL_BASED_OUTPATIENT_CLINIC_OR_DEPARTMENT_OTHER): Payer: Self-pay

## 2017-09-11 NOTE — Telephone Encounter (Signed)
Unable to reach pt regarding her mammogram. LM to call [facility] back.  Thayer [place], [place], 09/11/2017, 1:19 PM

## 2017-09-12 ENCOUNTER — Ambulatory Visit (HOSPITAL_BASED_OUTPATIENT_CLINIC_OR_DEPARTMENT_OTHER): Payer: MEDICARE | Admitting: Ophthalmology

## 2017-09-12 DIAGNOSIS — H169 Unspecified keratitis: Secondary | ICD-10-CM

## 2017-09-12 NOTE — Progress Notes (Signed)
79 years / F  -  1 week f/u - Keratitis left eye    Pt reports left eye is much improved.  Decreased pain #3 today.    Pt using the following eye drops as prescribed:  Vigamox OS 4 times/day   Pred forte OS BID  Bacitracin OS ung qhs    Ocular History:   Keratitis, left   Conjunctival concretions of left eye

## 2017-09-12 NOTE — Progress Notes (Signed)
Impression,    1.Keratitis OS-significantly better than last week after removing hard concretions from upper  palpebral conjunctiva    Plan-CCM    F/u 1 week

## 2017-09-12 NOTE — Progress Notes (Signed)
Follow up to evaluate keratitis OS

## 2017-09-18 ENCOUNTER — Other Ambulatory Visit (HOSPITAL_BASED_OUTPATIENT_CLINIC_OR_DEPARTMENT_OTHER): Payer: Self-pay | Admitting: Ophthalmology

## 2017-09-19 ENCOUNTER — Ambulatory Visit (HOSPITAL_BASED_OUTPATIENT_CLINIC_OR_DEPARTMENT_OTHER): Payer: MEDICARE | Admitting: Ophthalmology

## 2017-09-19 DIAGNOSIS — H169 Unspecified keratitis: Secondary | ICD-10-CM

## 2017-09-19 MED ORDER — PREDNISOLONE ACETATE 1 % OP SUSP: 1 [drp] | mL | Freq: Every day | OPHTHALMIC | 1 refills | 0 days | Status: AC

## 2017-09-19 MED ORDER — PREDNISOLONE ACETATE 1 % OP SUSP
1.00 [drp] | Freq: Every day | OPHTHALMIC | 1 refills | Status: AC
Start: 2017-09-19 — End: 2017-10-20

## 2017-09-19 NOTE — Progress Notes (Signed)
F/u to evaluate keratitis OS

## 2017-09-19 NOTE — Progress Notes (Signed)
Impression,    1. Keratitis-resolved    Plan-decrease Pred forte to 1 gtt OS QD x 1 week    D/C Vigamox    Continue Bacitracin ung qhs OS    F/u 3 weeks/prn

## 2017-09-19 NOTE — Progress Notes (Signed)
61 year old female Here for 1 week Follow-up to Re evaluate keratitis OS    Patient States that much better since last visit , some discomfort since last visit   (-) Vision Changes   (-) Flashes   (-) Floaters  (-) Eye Pain Scale 0/10  (-) Redness and Irritation   (-) Photophobia    (+) Discomfort " mild discomfort     Ocular History:  keratitis OS  DM Type II       Ocular Medications:   Pred Forte BID OS LD 11:00 am   Vigamox QID OS LD 11:00 am   Bacitracin OS qhs - last night       HEMOGLOBIN A1C (%)       Date                     Value                 04/10/2017               6.0 (H)               01/04/2017               6.1 (H)               08/06/2016               6.5 (H)

## 2017-09-21 ENCOUNTER — Other Ambulatory Visit (HOSPITAL_BASED_OUTPATIENT_CLINIC_OR_DEPARTMENT_OTHER): Payer: Self-pay | Admitting: Internal Medicine

## 2017-09-22 NOTE — Progress Notes (Signed)
[facility], Samantha Cameron is a 61 year old female has requested a refill of ATORVASTATIN.        Last Office Visit: 08/14/2017 with Hubbard Robinson  Last Physical Exam: 08/15/2015        Statin Med:  Lipids   Cholesterol (mg/dL)   Date Value   08/06/2016 143     LOW DENSITY LIPOPROTEIN DIRECT (mg/dL)   Date Value   08/06/2016 67     HIGH DENSITY LIPOPROTEIN (mg/dL)   Date Value   08/06/2016 44     TRIGLYCERIDES (mg/dl)   Date Value   02/08/2009 189 (H)   LFTs   ALANINE AMINOTRANSFERASE (U/L)   Date Value   04/18/2017 26       ASPARTATE AMINOTRANSFERASE (U/L)   Date Value   04/18/2017 19       ALBUMIN (g/dL)   Date Value   04/18/2017 3.9       TOTAL PROTEIN (g/dL)   Date Value   04/18/2017 7.3       BILIRUBIN DIRECT (mg/dl)   Date Value   04/18/2017 0.1       BILIRUBIN TOTAL (mg/dL)   Date Value   04/18/2017 0.2       ALKALINE PHOSPHATASE (U/L)   Date Value   04/18/2017 91        Documented patient preferred pharmacies:    [facility] [place], [place]  Phone: [phone] Fax: [phone]

## 2017-09-23 ENCOUNTER — Telehealth (HOSPITAL_BASED_OUTPATIENT_CLINIC_OR_DEPARTMENT_OTHER): Payer: Self-pay

## 2017-09-23 NOTE — Progress Notes (Signed)
Treater Name:  [facility]  Tracking #: [id number]    Member Name:  Takirah, Binford I  Status: Pending    RequestType:  New Form  Created: 09/23/2017 2:53:00 PM       Facility Location:  Member Pickup Address:    [address]  [place], [place], [address]  [place], [place], [address]    [phone]  [place] within [place]:  - Yes   Requested Service:  - Pain Management   Duration of Services:  - 12 MONTHS   Frequency of Services:  - 1 visit(s) per WEEK   Wheelchair van needed:  - No   Escort accompanying the member:  - No   Service Animal needed: - No

## 2017-10-17 ENCOUNTER — Ambulatory Visit (HOSPITAL_BASED_OUTPATIENT_CLINIC_OR_DEPARTMENT_OTHER): Payer: MEDICARE | Admitting: Ophthalmology

## 2017-10-28 ENCOUNTER — Ambulatory Visit (HOSPITAL_BASED_OUTPATIENT_CLINIC_OR_DEPARTMENT_OTHER): Payer: MEDICARE | Admitting: Internal Medicine

## 2017-10-28 ENCOUNTER — Encounter (HOSPITAL_BASED_OUTPATIENT_CLINIC_OR_DEPARTMENT_OTHER): Payer: Self-pay | Admitting: Internal Medicine

## 2017-10-28 VITALS — BP 99/81 | HR 120 | Temp 95.4°F | Wt 233.0 lb

## 2017-10-28 DIAGNOSIS — E1129 Type 2 diabetes mellitus with other diabetic kidney complication: Secondary | ICD-10-CM

## 2017-10-28 DIAGNOSIS — M5441 Lumbago with sciatica, right side: Secondary | ICD-10-CM

## 2017-10-28 DIAGNOSIS — R809 Proteinuria, unspecified: Secondary | ICD-10-CM

## 2017-10-28 DIAGNOSIS — E348 Other specified endocrine disorders: Secondary | ICD-10-CM

## 2017-10-28 DIAGNOSIS — F17200 Nicotine dependence, unspecified, uncomplicated: Secondary | ICD-10-CM

## 2017-10-28 DIAGNOSIS — M5442 Lumbago with sciatica, left side: Secondary | ICD-10-CM

## 2017-10-28 DIAGNOSIS — B9789 Other viral agents as the cause of diseases classified elsewhere: Secondary | ICD-10-CM

## 2017-10-28 DIAGNOSIS — J069 Acute upper respiratory infection, unspecified: Secondary | ICD-10-CM

## 2017-10-28 DIAGNOSIS — G8929 Other chronic pain: Secondary | ICD-10-CM

## 2017-10-28 LAB — LIPID PANEL
Cholesterol: 131 mg/dL (ref 0–239)
HIGH DENSITY LIPOPROTEIN: 47 mg/dL (ref 40–?)
LOW DENSITY LIPOPROTEIN DIRECT: 61 mg/dL (ref 0–189)
TRIGLYCERIDES: 240 mg/dL — ABNORMAL HIGH (ref 0–150)

## 2017-10-28 LAB — COMPREHENSIVE METABOLIC PANEL
ALANINE AMINOTRANSFERASE: 21 U/L (ref 12–45)
ALBUMIN: 3.8 g/dL (ref 3.4–5.0)
ALKALINE PHOSPHATASE: 95 U/L (ref 45–117)
ANION GAP: 6 mmol/L (ref 5–15)
ASPARTATE AMINOTRANSFERASE: 12 U/L (ref 8–34)
BILIRUBIN TOTAL: 0.2 mg/dL (ref 0.2–1.0)
BUN (UREA NITROGEN): 10 mg/dL (ref 7–18)
CALCIUM: 8.9 mg/dL (ref 8.5–10.1)
CARBON DIOXIDE: 28 mmol/L (ref 21–32)
CHLORIDE: 106 mmol/L (ref 98–107)
CREATININE: 0.8 mg/dL (ref 0.4–1.2)
ESTIMATED GLOMERULAR FILT RATE: >60 mL/min (ref >60)
Glucose Random: 149 mg/dL (ref 74–160)
POTASSIUM: 4.1 mmol/L (ref 3.5–5.1)
SODIUM: 140 mmol/L (ref 136–145)
TOTAL PROTEIN: 7 g/dL (ref 6.4–8.2)

## 2017-10-28 NOTE — Patient Instructions (Addendum)
Patient Education     Bariatric Surgery Information  Bariatric surgery, also called weight loss surgery, is a procedure that helps you lose weight. You may consider or your health care provider may suggest bariatric surgery if:   You are severely obese and have been unable to lose weight through diet and exercise.   You have health problems related to obesity, such as:  ? Type 2 diabetes.  ? Heart disease.  ? Lung disease.    How does bariatric surgery help me lose weight?  Bariatric surgery helps you lose weight by decreasing how much food your body absorbs. This is done by closing off part of your stomach to make it smaller. This restricts the amount of food your stomach can hold. Bariatric surgery can also change your bodys regular digestive process, so that food bypasses the parts of your body that absorb calories and nutrients.  If you decide to have bariatric surgery, it is important to continue to eat a healthy diet and exercise regularly after the surgery.  What are the different kinds of bariatric surgery?  There are two kinds of bariatric surgeries:   Restrictive surgeries make your stomach smaller. They do not change your digestive process. The smaller the size of your new stomach, the less food you can eat. There are different types of restrictive surgeries.   Malabsorptive surgeries both make your stomach smaller and alter your digestive process so that your body processes less calories and nutrients. These are the most common kind of bariatric surgery. There are different types of malabsorptive surgeries.    What are the different types of restrictive surgery?  Adjustable Gastric Banding   In this procedure, an inflatable band is placed around your stomach near the upper end. This makes the passageway for food into the rest of your stomach much smaller. The band can be adjusted, making it tighter or looser, by filling it with salt solution. Your surgeon can adjust the band based on how are you  feeling and how much weight you are losing. The band can be removed in the future.  Vertical Banded Gastroplasty   In this procedure, staples are used to separate your stomach into two parts, a small upper pouch and a bigger lower pouch. This decreases how much food you can eat.  Sleeve Gastrectomy   In this procedure, your stomach is made smaller. This is done by surgically removing a large part of your stomach. When your stomach is smaller, you feel full more quickly and reduce how much you eat.  What are the different types of malabsorptive surgery?  Roux-en-Y Gastric Bypass (RGB)   This is the most common weight loss surgery. In this procedure, a small stomach pouch is created in the upper part of your stomach. Next, this small stomach pouch is attached directly to the middle part of your small intestine. The farther down your small intestine the new connection is made, the fewer calories and nutrients you will absorb.  Biliopancreatic Diversion with Duodenal Switch (BPD/DS)   This is a multi-step procedure. In this procedure, a large part of your stomach is removed, making your stomach smaller. Next, this smaller stomach is attached to the lower part of your small intestine. Like the RGB surgery, you absorb fewer calories and nutrients the farther down your small intestine the attachment is made.  What are the risks of bariatric surgery?  As with any surgical procedure, each type of bariatric surgery has its own risks. These risks also  depend on your age, your overall health, and any other medical conditions you may have. When deciding on bariatric surgery, it is very important to:   Talk to your health care provider and choose the surgery that is best for you.   Ask your health care provider about specific risks for the surgery you choose.    Where to find more information:   American Society for Metabolic & Bariatric Surgery: www.asmbs.org   Weight-control Information Network (WIN):  win.AmenCredit.is  This information is not intended to replace advice given to you by your health care provider. Make sure you discuss any questions you have with your health care provider.  Document Released: 11/05/2005 Document Revised: 04/12/2016 Document Reviewed: 05/06/2013  Elsevier Interactive Patient Education  2017 [employer].       Patient Education      Index Spanish   Surgery for Severe Obesity     ________________________________________________________________________  KEY POINTS   If you are severely obese, surgery may help you lose weight when other treatments have not worked.   There are many types of bariatric surgery. Your healthcare provider will work closely with you to figure out the best type of surgery for you.   Ask your provider how long it will take to recover and how to take care of yourself at home.   Make sure you know what symptoms or problems you should watch for and what to do if you have them.  ________________________________________________________________________  What is surgery for severe obesity?  Surgery for severe obesity may be done to help you lose weight when other treatments have not worked. The aim of surgery is to change your stomach or intestines to limit the amount of food you can eat before you feel full. It is also called bariatric surgery.  Severe obesity is defined as being more than 100 pounds overweight or having a body mass index (BMI) of 40 or higher. BMI is a number that takes into account both your height and your weight. A BMI of 25 or more indicates that you are overweight. A BMI of 30 or more means that you are obese. Severe obesity is when you have a BMI of more than 40, or of more than 35 when you have a serious health problem related to your weight.  When is it used?  Severe obesity is a serious condition. It increases your risk of poor health and major illnesses, such as heart disease, stroke, cancer, high cholesterol, and diabetes.  Usually  bariatric surgery is done only if:   You have tried other treatments, including low-calorie diets and exercise, but you have not lost enough weight   You have a BMI greater than 40 or a BMI greater than 35 with an illness related to obesity. This surgery can:  ? Lower blood sugar levels  ? Lower blood pressure and cholesterol  ? Lessen the workload on your heart  ? Improve sleep apnea, which is a problem where you stop breathing for more than 10 seconds at a time many times while you sleep  Before you can have bariatric surgery, you need to be ready to change your lifestyle. Your provider will recommend that you:   Meet with a dietitian about changing the way you eat. For example, after surgery you need to make sure that you get the nutrients you need. You will be eating smaller amounts, drinking lots of liquid, and eating slowly. You may also need to take protein and vitamin supplements.  Follow a supervised diet before surgery to show that you are willing to make changes to improve your health.   Get counseling and support before and after surgery. Its a big change that will affect your mind and emotions, as well as your body. You need to understand that surgery doesnt mean your weight loss is permanent. You will need to eat a healthy diet and exercise, or you will likely gain back the weight.  How do I prepare for this procedure?   Talk to your healthcare provider and to other people who have had the surgery. Knowing what to expect can help lessen anxiety about the operation.   Make plans for your care and recovery after you have the procedure. Find someone to give you a ride home after the procedure. Allow for time to rest and try to find other people to help with your day-to-day tasks while you recover.   Your healthcare provider will tell you when to stop eating and drinking before the procedure. This helps to keep you from vomiting during the procedure.   Tell your healthcare provider if you have  any food, medicine, or other allergies such as latex.   Follow your provider's instructions about not smoking before and after the procedure. Smokers may have more breathing problems during and after the procedure and heal more slowly. Its best to quit 6 to 8 weeks before surgery.   You may or may not need to take your regular medicines the day of the procedure. Tell your healthcare provider about all medicines and supplements that you take. Some products may increase your risk of side effects. Ask your healthcare provider if you need to avoid taking any medicine or supplements before the procedure.   Follow any other instructions your healthcare provider gives you.   Ask any questions you have before the procedure. You should understand what your healthcare provider is going to do. You have the right to make decisions about your healthcare and to give permission for any tests or procedures.   Contact your insurance company to find out if it will pay for this surgery for you. If not, you need to make payment arrangements.  What happens during the procedure?  Before the procedure, you will be given a general anesthetic to keep you from feeling pain. A general anesthetic relaxes your muscles and puts you into a deep sleep.   Obesity surgery may be done in the following ways:  ? Gastric banding (LAP-BAND): A band is placed around the upper part of your stomach. A soft cap (port) connected to the band is placed under the skin near your stomach. Fluid can be injected or removed with a needle inserted through the port. The fluid tightens the band to control the size of the stomach and limit the amount of food you can eat at one time. Fluid can be removed to relax the band if necessary.  ? Roux-en-Y gastric bypass surgery (RYGB): Much of the stomach, which is normally the size of a football, is stapled shut. A small pouch of stomach, about the size of an egg, is then connected to the small intestine.  ? Vertical  sleeve gastrectomy (VSG): Most of the stomach is removed, leaving a small tube of stomach, called a gastric sleeve, connected to the intestine as it was before the surgery.  ? Biliopancreatic diversion with a duodenal switch (also called BPD-DS or duodenal switch): A large part of the stomach is removed, leaving a gastric sleeve. The  stomach will still move food into the top part of the small intestine called the duodenum. The duodenum will be connected to the top part of the large intestine.  ? Vagal nerve blockade (VBLOC): Electrodes are placed onto the lower part of the stomach and attached to a battery pack. Electrical signals block the vagus nerve in the stomach to decrease feelings of hunger.  ? Intragastric balloon system: One or two balloons filled with salt water are placed in the stomach to take up space. The balloons make you feel full and decrease the amount of food you can eat.  These procedures may be done in 2 ways:   Laparoscopic surgery is done through several small cuts in the belly. A laparoscope is a lighted tube with a camera. Your provider can put the scope and tools into your belly through the small cuts. Most surgeries are done this way.   Open surgery (with bigger cuts in the belly) may be needed if you are very obese, have had belly surgery before, or if you have certain medical problems.  What happens after the procedure?  Depending on the type of procedure you have, you will stay at the hospital for several days. You may be able to return to your normal activities in 3 to 5 weeks.  You will need follow-up visits for the rest of your life. Depending on which procedure you have, your diet may need to change in the following ways:   You may need to eat very small servings. At first, eat just a few tablespoons at a time and then up to a little over half a cup by the end of a year. If you eat too much, you may vomit and gag on food, or injure the new stomach pouch. It may be hard to eat  tough foods like steak, or foods that are hard, like carrots or apples.   You may not be able to eat foods high in sugar because your body may not be able to digest it well. After some procedures, eating or drinking too fast, or eating too much fat or sugar can cause dumping syndrome. This means that you will have nausea, vomiting, diarrhea, dizziness, and sweating after eating. Dumping syndrome is usually not a problem if the gastric band procedure is used.   You may need to keep track of what you eat to be sure you get enough protein.   You may need to take protein, vitamin, and mineral supplements for the rest of your life.  What are the risks of this procedure?  Every procedure or treatment has risks. Some possible risks of this procedure include:   You may have problems with anesthesia.   You may have infection, bleeding, or blood clots.   Other parts of your body may be injured during the procedure.   You may tire of the very restricted eating, and you may gain some weight back.  Ask your healthcare provider how these risks apply to you. Be sure to discuss any other questions or concerns that you may have.   Developed by [employer].  Adult Advisor 2017.1 published by [employer].  Last modified: 2015-01-19  Last reviewed: 2015-06-03  This content is reviewed periodically and is subject to change as new health information becomes available. The information is intended to inform and educate and is not a replacement for medical evaluation, advice, diagnosis or treatment by a healthcare professional.  References   Adult Advisor 2017.1 Index    Copyright  2017 [employer], a division of [employer] All rights reserved.

## 2017-10-28 NOTE — Progress Notes (Signed)
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) ([place])     Tobacco use disorder     Spinal stenosis of lumbar region     HLD (hyperlipidemia)     Chronic bilateral low back pain without sciatica     Skin change     Pineal gland cyst     History of vitamin D deficiency     Hemorrhoids     Venous (peripheral) insufficiency     Adrenal adenoma     Type 2 diabetes mellitus with microalbuminuria, without long-term current use of insulin ([place])     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 (HCC)     Acute pain of right wrist     Abnormal PFTs     Screening for malignant neoplasm of breast     Skin irritation     H/O bone density study     Vertigo     Orthostatic hypotension     Social problem     Right arm weakness     Insomnia     Acute pain of left knee     Hx of total knee replacement, left     Acute bacterial sinusitis     Seasonal allergic rhinitis     Left foot pain     Stress due to family tension     Gastritis without bleeding     Leucocytosis      DM   Currently on Metformin 500 mg - 4 tablets at dinner time   No numbness, tingling, diaphoresis, shakiness or dizziness   Missed ophthalmology appt on 10/17/17   HEMOGLOBIN A1C (%)   Date Value   04/10/2017 6.0 (H)   01/04/2017 6.1 (H)   08/06/2016 6.5 (H)       Most Recent BP Reading(s)  10/28/17 : 99/81  08/14/17 : 111/73  07/31/17 : 121/78  06/20/17 : 102/69  04/23/17 : 97/73    Notes she has not been able to exercise due to her chronic back pain   Pt reports she tries walking around the house.   Prefers to lose weight naturally than to rely on weight loss surgery     Most Recent Weight Reading(s)  10/28/17 : 105.7 kg (233 lb)  08/14/17 : 104.8 kg (231 lb)  07/31/17 : 103.4 kg (228 lb)  06/20/17 : 102.6 kg (226 lb 3.2 oz)  04/23/17 : 101.2 kg (223 lb)    Cold   Notes cold like sx started on Tuesday 10/22/17   Pt reports sneezing and headache "my  whole head" were first noticed.   Notes feeling dizzy after initial sx - improved since onset "I was a little dizzy this morning but went away"   Has been drinking "a lot of water"   Pt reports headache is still happening however  Did not take any pain medicines "I don't take Tylenol because of Percocet"   Has been taking Sudafed but without any relief   Tried vapor Vicks and humidified air - helps   Feels the changes in temperature worsened her sx "teh moment that cold air hit, I get more congested"     Tobacco use   Still smoking   Pt notes she has cut down since her cold like sx   Reports smoking only two cigarettes today and less while being sick  Back pain   Still taking oxycodone-acetaminophen 5-325 mg bid   Recently completed back XR "he said my back has worsening since last xray"   Notes EMG and injection will be completed   Reports worsening and constant back pain lately - difficulties walking   Pt reports tingling going down her legs bilaterally    Cyst   Notes cyst in the brain and MRI to be redone for monitoring "Every 2 years"   Last MRI completed on 06/14/16 - no cyst was commented upon or seen during the last 2 MRI (06/14/16 and 04/26/16)  Notes the last MRI completed "they did not do the dye to see it" - defers MRI for now until next year.     PE:  BP 99/81 (Site: RA, Position: Sitting, Cuff Size: Lrg)  Pulse 120  Temp 95.4 F (35.2 C) (Temporal)  Wt 105.7 kg (233 lb)  LMP 10/22/2007  SpO2 99%  BMI 42.62 kg/m2  Estimated body mass index is 42.62 kg/m as calculated from the following:    Height as of 04/19/17: 5\' 2"  (1.575 m).    Weight as of this encounter: 105.7 kg (233 lb).  Gen - Morbidly obese middle aged female upright in chair in NAD, wearing glasses   HEENT - No frontal or maxillary TTP. No swollen lymph nodes in the neck and no swollen tonsils.   CV - RRR, no m/r/g  Resp - CTAB  Neuro - A+Ox3  Psych - Appropriate    A/P:  (E11.29,  R80.9) Type 2 diabetes mellitus with microalbuminuria,  without long-term current use of insulin (HCC)  (primary encounter diagnosis)  Comment: Controlled at last check, due  Plan: COLLECTION VENOUS BLOOD VENIPUNCTURE,         HEMOGLOBIN A1C, LIPID PANEL, COMPREHENSIVE         METABOLIC PANEL  Continue with current medication regimen and lifestyle change    (E66.01) 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 (HCC)  Comment: By BMI  Plan: Discussed portion control, healthy food choice, exercise  Handout given to pt on "Bariatric Surgery Information"     (J06.9,  B97.89) Viral URI with cough  Comment: Likely viral  Plan: Advised to drink 7-8 glasses of water or until urine runs clear.  Encouraged slow position changes to avoid adverse dizziness   Discussed supportive care with rest, hydration, tea with honey, humidified air.    Advised to call if fever, SOB, CP, worsening symptoms or persistent symptoms after 1-2 weeks.      (F17.200) Tobacco use disorder  Comment: Contemplative  Plan: Advise cutting down slowly and steadily or to quit altogether if tolerated.     (M54.42,  M54.41,  G89.29) Chronic bilateral low back pain with bilateral sciatica  Comment: Controlled, working with external pain phsycian  Plan: Continue with current medication regimen   Continue working with Dr. Dwyane Dee  - EMG and cortisone injection.     F/u DM in 3 months.     By signing my name below, I, Lemar Lofty, attest that this documentation has been prepared under the direction and in the presence of Osa Craver, M.D.  Electronically Signed:  Lemar Lofty, Medical Scribe. 10/28/17 5:01 PM    I, Osa Craver, personally performed the services described in this documentation. All medical record entries made by the scribe were at my direction and in my presence. I have reviewed the chart and discharge instructions (if applicable) and agree that the record reflects my personal performance and  is accurate and complete.   Osa Craver, MD

## 2017-10-29 LAB — HEMOGLOBIN A1C
ESTIMATED AVERAGE GLUCOSE: 128 (ref 74–160)
HEMOGLOBIN A1C: 6.1 % — ABNORMAL HIGH (ref 4.0–5.6)

## 2017-11-18 DIAGNOSIS — J069 Acute upper respiratory infection, unspecified: Secondary | ICD-10-CM

## 2017-11-18 HISTORY — DX: Acute upper respiratory infection, unspecified: J06.9

## 2017-11-20 ENCOUNTER — Telehealth (HOSPITAL_BASED_OUTPATIENT_CLINIC_OR_DEPARTMENT_OTHER): Payer: Self-pay

## 2017-11-20 NOTE — Progress Notes (Signed)
Treater Name:[facility] Tracking [id number]   Member Name:Samantha Cameron, Samantha Cameron Status:Pending   RequestType:New Form Created:11/20/2017 9:46:00 AM      Facility Location: Member Pickup Address:   [address] [place], [place], [address] [place], [place], [address]   [phone] [place] within [employer]:- Yes   Requested Service:- Pain Management   Duration of Services:- 12 MONTHS   Frequency of Services:- 3 visit(s) per WEEK   Wheelchair van needed:- No   Escort accompanying the member:- Yes   Service Animal needed:- No     Micheline Rough, [place], 11/20/2017, 9:46 AM             Treater Name:[facility] Tracking #:[id number]   Member Name:Samantha Cameron Status:Authorized   Request Type:New Form Created:11/20/2017 9:46:00 AM      Facility Location: Member Pickup Address:   [address] [place], [place] [address] [place], [place] [address]      Requested Service:- Pain Management   Explanation:- Medical reason prevents use of public transportation   Frequency of Services:- 3 per Week   Wheelchair van needed:- No   Escort accompanying the member:- Yes   Service Animal needed:- No     Rayna Brenner AJoneen Caraway, [place], 11/21/2017, 1:39 PM

## 2017-12-04 DIAGNOSIS — M47817 Spondylosis without myelopathy or radiculopathy, lumbosacral region: Secondary | ICD-10-CM | POA: Diagnosis not present

## 2017-12-04 DIAGNOSIS — Z79891 Long term (current) use of opiate analgesic: Secondary | ICD-10-CM | POA: Diagnosis not present

## 2017-12-04 DIAGNOSIS — M5417 Radiculopathy, lumbosacral region: Secondary | ICD-10-CM | POA: Diagnosis not present

## 2017-12-04 DIAGNOSIS — M5136 Other intervertebral disc degeneration, lumbar region: Secondary | ICD-10-CM | POA: Diagnosis not present

## 2017-12-13 DIAGNOSIS — Z79891 Long term (current) use of opiate analgesic: Secondary | ICD-10-CM | POA: Diagnosis not present

## 2017-12-13 DIAGNOSIS — M5136 Other intervertebral disc degeneration, lumbar region: Secondary | ICD-10-CM | POA: Diagnosis not present

## 2017-12-13 DIAGNOSIS — M5417 Radiculopathy, lumbosacral region: Secondary | ICD-10-CM | POA: Diagnosis not present

## 2018-01-08 DIAGNOSIS — M5417 Radiculopathy, lumbosacral region: Secondary | ICD-10-CM | POA: Diagnosis not present

## 2018-01-10 DIAGNOSIS — M5136 Other intervertebral disc degeneration, lumbar region: Secondary | ICD-10-CM | POA: Diagnosis not present

## 2018-01-10 DIAGNOSIS — M5417 Radiculopathy, lumbosacral region: Secondary | ICD-10-CM | POA: Diagnosis not present

## 2018-01-10 DIAGNOSIS — M47817 Spondylosis without myelopathy or radiculopathy, lumbosacral region: Secondary | ICD-10-CM | POA: Diagnosis not present

## 2018-01-20 ENCOUNTER — Other Ambulatory Visit (HOSPITAL_BASED_OUTPATIENT_CLINIC_OR_DEPARTMENT_OTHER): Payer: Self-pay | Admitting: Internal Medicine

## 2018-01-20 DIAGNOSIS — E1129 Type 2 diabetes mellitus with other diabetic kidney complication: Secondary | ICD-10-CM

## 2018-01-20 DIAGNOSIS — R809 Proteinuria, unspecified: Principal | ICD-10-CM

## 2018-01-20 NOTE — Progress Notes (Signed)
PER Pharmacy, Samantha Cameron is a 62 year old female has requested a refill of metformin xr 500mg .      Last Office Visit: 10/28/2017 with Lenore Cordia  Last Physical Exam: 08/15/2015    There are no preventive care reminders to display for this patient.    Diabetic Med:   HEMOGLOBIN A1C (%)   Date Value   10/28/2017 6.1 (H)       No results found for: POCA1C   CREATININE (mg/dL)   Date Value   10/28/2017 0.8       Documented patient preferred pharmacies:    [facility] [place], [place]  Phone: [phone] Fax: [phone]

## 2018-02-01 ENCOUNTER — Other Ambulatory Visit (HOSPITAL_BASED_OUTPATIENT_CLINIC_OR_DEPARTMENT_OTHER): Payer: Self-pay | Admitting: Internal Medicine

## 2018-02-01 NOTE — Progress Notes (Signed)
PER Pharmacy, Samantha Cameron is a 62 year old female has requested a refill of lancets.      Last Office Visit: 10/28/17 with Lenore Cordia  Last Physical Exam: 08/15/15    There are no preventive care reminders to display for this patient.    Other Med Adult:  Most Recent BP Reading(s)  10/28/17 : 99/81        Cholesterol (mg/dL)   Date Value   10/28/2017 131     LOW DENSITY LIPOPROTEIN DIRECT (mg/dL)   Date Value   10/28/2017 61     HIGH DENSITY LIPOPROTEIN (mg/dL)   Date Value   10/28/2017 47     TRIGLYCERIDES (mg/dL)   Date Value   10/28/2017 240 (H)         THYROID SCREEN TSH REFLEX FT4 (uIU/mL)   Date Value   04/12/2016 1.260         No results found for: TSH    HEMOGLOBIN A1C (%)   Date Value   10/28/2017 6.1 (H)       No results found for: POCA1C      INR (no units)   Date Value   04/18/2017 1.0   05/27/2016 1.0   02/01/2012 < 1.0 (L)       SODIUM (mmol/L)   Date Value   10/28/2017 140       POTASSIUM (mmol/L)   Date Value   10/28/2017 4.1           CREATININE (mg/dL)   Date Value   10/28/2017 0.8       Documented patient preferred pharmacies:    [facility] [place], [place]  Phone: [phone] Fax: [phone]

## 2018-02-07 DIAGNOSIS — M47817 Spondylosis without myelopathy or radiculopathy, lumbosacral region: Secondary | ICD-10-CM | POA: Diagnosis not present

## 2018-02-07 DIAGNOSIS — M5417 Radiculopathy, lumbosacral region: Secondary | ICD-10-CM | POA: Diagnosis not present

## 2018-02-07 DIAGNOSIS — Z79891 Long term (current) use of opiate analgesic: Secondary | ICD-10-CM | POA: Diagnosis not present

## 2018-02-07 DIAGNOSIS — M5137 Other intervertebral disc degeneration, lumbosacral region: Secondary | ICD-10-CM | POA: Diagnosis not present

## 2018-02-20 DIAGNOSIS — M47817 Spondylosis without myelopathy or radiculopathy, lumbosacral region: Secondary | ICD-10-CM | POA: Diagnosis not present

## 2018-02-26 ENCOUNTER — Telehealth (HOSPITAL_BASED_OUTPATIENT_CLINIC_OR_DEPARTMENT_OTHER): Payer: Self-pay | Admitting: Internal Medicine

## 2018-02-26 NOTE — Progress Notes (Signed)
Completed, will be faxed and re-scanned  Samantha Cameron P. Samantha Salsberry,MD, 02/26/2018, 4:32 PM

## 2018-02-26 NOTE — Progress Notes (Signed)
Received DWO form from [facility] for lancets.    Form has been scanned into Environmental health practitioner ([facility] updated).      Please review, sign, and fax to [phone].

## 2018-03-03 DIAGNOSIS — M5417 Radiculopathy, lumbosacral region: Secondary | ICD-10-CM | POA: Diagnosis not present

## 2018-03-08 ENCOUNTER — Other Ambulatory Visit (HOSPITAL_BASED_OUTPATIENT_CLINIC_OR_DEPARTMENT_OTHER): Payer: Self-pay | Admitting: Internal Medicine

## 2018-03-08 NOTE — Progress Notes (Signed)
PER Pharmacy, Samantha Cameron is a 62 year old female has requested a refill of lisinopil.      Last Office Visit: 10/28/2017 with simons christopher   Last Physical Exam: n/a    There are no preventive care reminders to display for this patient.    Other Med Adult:  Most Recent BP Reading(s)  10/28/17 : 99/81        Cholesterol (mg/dL)   Date Value   10/28/2017 131     LOW DENSITY LIPOPROTEIN DIRECT (mg/dL)   Date Value   10/28/2017 61     HIGH DENSITY LIPOPROTEIN (mg/dL)   Date Value   10/28/2017 47     TRIGLYCERIDES (mg/dL)   Date Value   10/28/2017 240 (H)         THYROID SCREEN TSH REFLEX FT4 (uIU/mL)   Date Value   04/12/2016 1.260         No results found for: TSH    HEMOGLOBIN A1C (%)   Date Value   10/28/2017 6.1 (H)       No results found for: POCA1C      INR (no units)   Date Value   04/18/2017 1.0   05/27/2016 1.0   02/01/2012 < 1.0 (L)       SODIUM (mmol/L)   Date Value   10/28/2017 140       POTASSIUM (mmol/L)   Date Value   10/28/2017 4.1           CREATININE (mg/dL)   Date Value   10/28/2017 0.8       Documented patient preferred pharmacies:    [facility] [place], [place]  Phone: [phone] Fax: [phone]

## 2018-03-10 DIAGNOSIS — M47817 Spondylosis without myelopathy or radiculopathy, lumbosacral region: Secondary | ICD-10-CM | POA: Diagnosis not present

## 2018-03-25 ENCOUNTER — Emergency Department (HOSPITAL_BASED_OUTPATIENT_CLINIC_OR_DEPARTMENT_OTHER)
Admission: RE | Admit: 2018-03-25 | Disposition: A | Discharge status: Home / Self Care | Payer: Self-pay | Source: Emergency Department | Attending: Emergency Medicine | Admitting: Emergency Medicine

## 2018-03-25 ENCOUNTER — Encounter (HOSPITAL_BASED_OUTPATIENT_CLINIC_OR_DEPARTMENT_OTHER): Payer: Self-pay

## 2018-03-25 ENCOUNTER — Encounter (HOSPITAL_BASED_OUTPATIENT_CLINIC_OR_DEPARTMENT_OTHER): Payer: Self-pay | Admitting: OAS-Outpatient Addiction Svcs(PRV Practice 40)

## 2018-03-25 DIAGNOSIS — R1013 Epigastric pain: Secondary | ICD-10-CM | POA: Diagnosis not present

## 2018-03-25 DIAGNOSIS — R072 Precordial pain: Secondary | ICD-10-CM | POA: Diagnosis not present

## 2018-03-25 DIAGNOSIS — R918 Other nonspecific abnormal finding of lung field: Secondary | ICD-10-CM | POA: Diagnosis not present

## 2018-03-25 DIAGNOSIS — E785 Hyperlipidemia, unspecified: Secondary | ICD-10-CM | POA: Diagnosis not present

## 2018-03-25 DIAGNOSIS — I10 Essential (primary) hypertension: Secondary | ICD-10-CM | POA: Diagnosis not present

## 2018-03-25 DIAGNOSIS — Z98891 History of uterine scar from previous surgery: Secondary | ICD-10-CM | POA: Diagnosis not present

## 2018-03-25 DIAGNOSIS — J449 Chronic obstructive pulmonary disease, unspecified: Secondary | ICD-10-CM | POA: Diagnosis not present

## 2018-03-25 DIAGNOSIS — Z6841 Body Mass Index (BMI) 40.0 and over, adult: Secondary | ICD-10-CM | POA: Diagnosis not present

## 2018-03-25 DIAGNOSIS — R52 Pain, unspecified: Secondary | ICD-10-CM | POA: Diagnosis not present

## 2018-03-25 DIAGNOSIS — R4182 Altered mental status, unspecified: Secondary | ICD-10-CM | POA: Diagnosis not present

## 2018-03-25 DIAGNOSIS — R079 Chest pain, unspecified: Secondary | ICD-10-CM | POA: Diagnosis not present

## 2018-03-25 DIAGNOSIS — J181 Lobar pneumonia, unspecified organism: Secondary | ICD-10-CM | POA: Diagnosis not present

## 2018-03-25 DIAGNOSIS — F17210 Nicotine dependence, cigarettes, uncomplicated: Secondary | ICD-10-CM | POA: Diagnosis not present

## 2018-03-25 DIAGNOSIS — K760 Fatty (change of) liver, not elsewhere classified: Secondary | ICD-10-CM | POA: Diagnosis not present

## 2018-03-25 DIAGNOSIS — Z9089 Acquired absence of other organs: Secondary | ICD-10-CM | POA: Diagnosis not present

## 2018-03-25 DIAGNOSIS — Z882 Allergy status to sulfonamides status: Secondary | ICD-10-CM | POA: Diagnosis not present

## 2018-03-25 DIAGNOSIS — E119 Type 2 diabetes mellitus without complications: Secondary | ICD-10-CM | POA: Diagnosis not present

## 2018-03-25 DIAGNOSIS — R05 Cough: Secondary | ICD-10-CM | POA: Diagnosis not present

## 2018-03-25 DIAGNOSIS — R0602 Shortness of breath: Secondary | ICD-10-CM | POA: Diagnosis not present

## 2018-03-25 LAB — CBC, PLATELET & DIFFERENTIAL
ABSOLUTE BASO COUNT: 0 10*3/uL (ref 0.0–0.1)
ABSOLUTE BASO COUNT: 0 10*3/uL (ref 0.0–0.1)
ABSOLUTE EOSINOPHIL COUNT: 0.5 10*3/uL (ref 0.0–0.8)
ABSOLUTE EOSINOPHIL COUNT: 0.5 10*3/uL (ref 0.0–0.8)
ABSOLUTE IMM GRAN COUNT: 0.12 10*3/uL — ABNORMAL HIGH (ref 0.00–0.03)
ABSOLUTE IMM GRAN COUNT: 0.02 10*3/uL (ref 0.00–0.03)
ABSOLUTE LYMPH COUNT: 4.7 10*3/uL (ref 0.6–5.9)
ABSOLUTE LYMPH COUNT: 3.4 10*3/uL (ref 0.6–5.9)
ABSOLUTE MONO COUNT: 1.1 10*3/uL (ref 0.2–1.4)
ABSOLUTE MONO COUNT: 1 10*3/uL (ref 0.2–1.4)
ABSOLUTE NEUTROPHIL COUNT: 8 10*3/uL (ref 1.6–8.3)
ABSOLUTE NEUTROPHIL COUNT: 7.1 10*3/uL (ref 1.6–8.3)
BASOPHIL %: 0.2 % (ref 0.0–1.2)
BASOPHIL %: 0.2 % (ref 0.0–1.2)
EOSINOPHIL %: 3.7 % (ref 0.0–7.0)
EOSINOPHIL %: 4.4 % (ref 0.0–7.0)
HEMATOCRIT: 40.7 % (ref 34.1–44.9)
HEMATOCRIT: 40.5 % (ref 34.1–44.9)
HEMOGLOBIN: 13.4 g/dL (ref 11.2–15.7)
HEMOGLOBIN: 13.1 g/dL (ref 11.2–15.7)
IMMATURE GRANULOCYTE %: 0.8 % — ABNORMAL HIGH (ref 0.0–0.4)
IMMATURE GRANULOCYTE %: 0.2 % (ref 0.0–0.4)
LYMPHOCYTE %: 32.5 % (ref 15.0–54.0)
LYMPHOCYTE %: 28.2 % (ref 15.0–54.0)
MEAN CORP HGB CONC: 32.9 g/dL (ref 31.0–37.0)
MEAN CORP HGB CONC: 32.3 g/dL (ref 31.0–37.0)
MEAN CORPUSCULAR HGB: 30 pg (ref 26.0–34.0)
MEAN CORPUSCULAR HGB: 29.6 pg (ref 26.0–34.0)
MEAN CORPUSCULAR VOL: 91.3 fL (ref 80.0–100.0)
MEAN CORPUSCULAR VOL: 91.4 fL (ref 80.0–100.0)
MEAN PLATELET VOLUME: 11.2 fL (ref 8.7–12.5)
MEAN PLATELET VOLUME: 10.8 fL (ref 8.7–12.5)
MONOCYTE %: 7.4 % (ref 4.0–13.0)
MONOCYTE %: 8.5 % (ref 4.0–13.0)
NEUTROPHIL %: 55.4 % (ref 40.0–75.0)
NEUTROPHIL %: 58.5 % (ref 40.0–75.0)
PLATELET COUNT: 287 10*3/uL (ref 150–400)
PLATELET COUNT: 283 10*3/uL (ref 150–400)
RBC DISTRIBUTION WIDTH STD DEV: 49.6 fL — ABNORMAL HIGH (ref 35.1–46.3)
RBC DISTRIBUTION WIDTH STD DEV: 49.3 fL — ABNORMAL HIGH (ref 35.1–46.3)
RBC DISTRIBUTION WIDTH: 15.2 % — ABNORMAL HIGH (ref 11.5–14.3)
RBC DISTRIBUTION WIDTH: 15.2 % — ABNORMAL HIGH (ref 11.5–14.3)
RED BLOOD CELL COUNT: 4.46 M/uL (ref 3.90–5.20)
RED BLOOD CELL COUNT: 4.43 M/uL (ref 3.90–5.20)
WHITE BLOOD CELL COUNT: 14.4 10*3/uL — ABNORMAL HIGH (ref 4.0–11.0)
WHITE BLOOD CELL COUNT: 12.2 10*3/uL — ABNORMAL HIGH (ref 4.0–11.0)

## 2018-03-25 LAB — COMPREHENSIVE METABOLIC PANEL
ALANINE AMINOTRANSFERASE: 35 U/L (ref 12–45)
ALBUMIN: 3.8 g/dL (ref 3.4–5.0)
ALKALINE PHOSPHATASE: 90 U/L (ref 45–117)
ANION GAP: 12 mmol/L (ref 5–15)
ASPARTATE AMINOTRANSFERASE: 24 U/L (ref 8–34)
BILIRUBIN TOTAL: 0.1 mg/dL — ABNORMAL LOW (ref 0.2–1.0)
BUN (UREA NITROGEN): 10 mg/dL (ref 7–18)
CALCIUM: 9.2 mg/dL (ref 8.5–10.1)
CARBON DIOXIDE: 26 mmol/L (ref 21–32)
CHLORIDE: 104 mmol/L (ref 98–107)
CREATININE: 0.7 mg/dL (ref 0.4–1.2)
ESTIMATED GLOMERULAR FILT RATE: >60 mL/min (ref >60)
Glucose Random: 95 mg/dL (ref 74–160)
POTASSIUM: 4.1 mmol/L (ref 3.5–5.1)
SODIUM: 142 mmol/L (ref 136–145)
TOTAL PROTEIN: 7.2 g/dL (ref 6.4–8.2)

## 2018-03-25 LAB — BASIC METABOLIC PANEL
ANION GAP: 13 mmol/L (ref 5–15)
BUN (UREA NITROGEN): 12 mg/dL (ref 7–18)
CALCIUM: 9.4 mg/dL (ref 8.5–10.1)
CARBON DIOXIDE: 25 mmol/L (ref 21–32)
CHLORIDE: 103 mmol/L (ref 98–107)
CREATININE: 0.6 mg/dL (ref 0.4–1.2)
ESTIMATED GLOMERULAR FILT RATE: >60 mL/min (ref >60)
Glucose Random: 104 mg/dL (ref 74–160)
POTASSIUM: 4.3 mmol/L (ref 3.5–5.1)
SODIUM: 141 mmol/L (ref 136–145)

## 2018-03-25 LAB — D-DIMER PE/DVT, QUANTITATIVE: D-DIMER PE/DVT, QUANTITATIVE: 322 ng/mLFEU (ref 0.00–499)

## 2018-03-25 LAB — TROPONIN I
TROPONIN I: <0.02 ng/mL (ref 0.00–0.04)
TROPONIN I: <0.02 ng/mL (ref 0.00–0.04)
TROPONIN I: <0.02 ng/mL (ref 0.00–0.04)

## 2018-03-25 LAB — MAGNESIUM
MAGNESIUM: 1.6 mg/dL — ABNORMAL LOW (ref 1.8–2.4)
MAGNESIUM: 1.7 mg/dL — ABNORMAL LOW (ref 1.8–2.4)

## 2018-03-25 LAB — LIPASE: LIPASE: 179 U/L (ref 73–393)

## 2018-03-25 LAB — XR CHEST PORTABLE

## 2018-03-25 MED ORDER — FAMOTIDINE 20 MG PO TABS
20.0000 mg | ORAL_TABLET | Freq: Once | ORAL | Status: AC
Start: 2018-03-25 — End: 2018-03-25
  Administered 2018-03-25: 20 mg via ORAL
  Filled 2018-03-25: qty 1

## 2018-03-25 MED ORDER — LEVOFLOXACIN 750 MG PO TABS
750.00 mg | ORAL_TABLET | Freq: Once | ORAL | Status: AC
Start: 2018-03-25 — End: 2018-03-25
  Administered 2018-03-25: 750 mg via ORAL
  Filled 2018-03-25: qty 1

## 2018-03-25 MED ORDER — LEVOFLOXACIN 750 MG PO TABS
750.00 mg | ORAL_TABLET | Freq: Every day | ORAL | 0 refills | Status: AC
Start: 2018-03-25 — End: 2018-03-29

## 2018-03-25 MED ORDER — LEVOFLOXACIN 750 MG PO TABS: 750 mg | tablet | Freq: Every day | ORAL | 0 refills | 0 days | Status: AC

## 2018-03-25 MED ORDER — ALUMINUM & MAGNESIUM HYDROXIDE 200-200 MG/5ML PO SUSP
30.0000 mL | Freq: Once | ORAL | Status: AC
Start: 2018-03-25 — End: 2018-03-25
  Administered 2018-03-25: 30 mL via ORAL
  Filled 2018-03-25: qty 30

## 2018-03-25 MED ORDER — LIDOCAINE VISCOUS 2 % MT SOLN
10.0000 mL | Freq: Once | OROMUCOSAL | Status: DC
Start: 2018-03-25 — End: 2018-03-26

## 2018-03-25 MED ORDER — LIDOCAINE VISCOUS 2 % MT SOLN
10.0000 mL | Freq: Once | OROMUCOSAL | Status: AC
Start: 2018-03-25 — End: 2018-03-25
  Administered 2018-03-25: 10 mL via OROMUCOSAL
  Filled 2018-03-25: qty 15

## 2018-03-25 MED ORDER — ASPIRIN 81 MG PO CHEW
324.0000 mg | CHEWABLE_TABLET | Freq: Once | ORAL | Status: AC
Start: 2018-03-25 — End: 2018-03-25
  Administered 2018-03-25: 324 mg via ORAL
  Filled 2018-03-25: qty 4

## 2018-03-25 NOTE — Narrator Note (Signed)
Pt resting in bed with no sign of distress. VSS. Remains SR on cardiac monitor.

## 2018-03-25 NOTE — ED Triage Note (Signed)
Pt to ED with c/o of chest pain. Per pt the chest pain started when she woke up from her sleep. Pain is located substernal and she also c/o of SOB.

## 2018-03-25 NOTE — ED Notes (Signed)
Bed: 02  Expected date:   Expected time:   Means of arrival:   Comments:  New pt

## 2018-03-25 NOTE — ED Provider Notes (Signed)
[facility] Emergency Medicine Attending Note     History of Present Illness:    Samantha Cameron is a 62 year old female patient with past medical history of gastroesophageal reflux disease, bronchitis with COPD, diabetes, gastritis, who presents with substernal chest pain.  Patient states she awoke at 12:30 AM with substernal chest pain.  She had some associated shortness of breath.  She denies abdominal pain, nausea, diaphoresis, fever.  She has had a recent cough, and states that it feels like something is stuck in her chest.  No leg pain or swelling, no recent immobilization. The patient was seen primarily by me. ED nursing record was reviewed. Prior records as available electronically through the Epic record were reviewed.    Patient's mode of arrival was by Ambulance [facility]  Arrival time:     Chief complaint: Chest Pain (CHEST PAIN)          Review of Systems:  As per HPI.  Otherwise unremarkable.     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  No date: Esophageal reflux  No date: External hemorrhoid      Comment:  per steward records  No date: HTN (hypertension)  02/01/2012: Hypoxia  02/16/2017: Left ear pain  06/07/2015: Left  genital labial abscess  06/16/2015: Obesity, Class III, 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y) ([place])  08/16/2015: Osteopenia      Comment:  On xray of left knee per steward records 06/2014 Unclear                if addressed  09/14/2015: Right ear impacted cerumen  08/15/2015: Type 2 diabetes mellitus without complication, without   long-term current use of insulin (HCC)      Comment:   HEMOGLOBIN A1C (%) Date Value 07/15/2015 6.2 (H)                ----------  Steward record - a1c 6.2 on 01/31/15 6.4 on                09/30/14 5.9 on 12/29/13 6.4 08/18/13   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) ([place])     Tobacco use disorder     Spinal stenosis of lumbar region     HLD (hyperlipidemia)     Chronic bilateral low back pain with bilateral sciatica     Skin change  Pineal gland cyst     History of vitamin D deficiency     Hemorrhoids     Venous (peripheral) insufficiency     Adrenal adenoma     Type 2 diabetes mellitus with microalbuminuria, without long-term current use of insulin (HCC)     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 (HCC)     Acute pain of right wrist     Abnormal PFTs     Screening for malignant neoplasm of breast     Skin irritation     H/O bone density study     Vertigo     Orthostatic hypotension     Social problem     Right arm weakness     Insomnia     Acute pain of left knee     Hx of total knee replacement, left     Acute bacterial sinusitis     Seasonal allergic rhinitis     Left foot pain     Stress due to family tension     Gastritis without bleeding     Leucocytosis     Viral URI with cough   Meds:  No current facility-administered medications for this encounter.      Current Outpatient Medications   Medication Sig    lisinopril (PRINIVIL,ZESTRIL) 20 MG tablet take 1 tablet by mouth once daily    ONETOUCH DELICA LANCETS FINE MISC TEST once daily    metFORMIN (GLUCOPHAGE-XR) 500 MG 24 hr tablet take 4 tablets by mouth once daily  WITH BREAKFAST    atorvastatin (LIPITOR) 20 MG tablet take 1 tablet by mouth once daily    metoprolol (TOPROL-XL) 25 MG 24 hr tablet take 1/2 tablet by mouth at bedtime    ONETOUCH VERIO test strip TEST once daily    albuterol (PROVENTIL HFA,VENTOLIN HFA, PROAIR HFA) 108 (90 Base) MCG/ACT inhaler Inhale 2 puffs into the lungs every 6 (six) hours as needed for Wheezing or Shortness of breath    Omega-3 Fatty Acids (FISH OIL) 1000 MG CAPS Take by mouth    cholecalciferol (VITAMIN D3) 1000 UNIT tablet Take 1 tablet by mouth daily    pregabalin (LYRICA) 100 MG capsule Take 100 mg by mouth 2 (two) times daily With Dr Dwyane Dee    oxycodone-acetaminophen (PERCOCET) 5-325 MG per tablet Take 1 tablet by mouth 2 (two) times daily as needed With Dr Dwyane Dee    aspirin 81 MG tablet Take 81 mg by mouth daily.         Past Surgical Hx:  Past Surgical History:  No date: ANES NERVE MUSC TENDON FASCIA&BURSA KNEE&/POPLT  No date: FOOT SURGERY      Comment:  bilateral  No date: LAPAROSCOPY SURG CHOLECYSTECTOMY  No date: OB ANTEPARTUM CARE CESAREAN DLVR & POSTPARTUM      Comment:  x3  No date: TONSILLECTOMY & ADENOIDECTOMY <AGE 68  04/13/2009: TOTAL KNEE REPLACEMENT      Comment:  left  08/11/2009: WRIST GANGLION EXCISION      Comment:  left  Allergies:  Review of Patient's Allergies indicates:   Codeine camsylate       Shortness of Breath, Other (See                            Comments)    Comment:Chest pain   Motrin [ibuprofen]      Nausea Only   Bactrim  Rash, Itching   Social Hx:  Social History    Tobacco Use      Smoking status: Current Every Day Smoker        Packs/day: 0.50        Years: 31.00        Pack years: 15.5      Smokeless tobacco: Never Used      Tobacco comment: quit smoking inform provided to patient    Alcohol use: No   Immunizations:  Immunization History   Administered Date(s) Administered    HEP B ADULT 3 DOSE 20 and > 08/15/2015, 09/14/2015, 05/17/2016    INFLUENZA VIRUS TRI W/PRESV VACCINE  18/> YRS IM (PRIVATE) 10/14/2007, 09/17/2008, 09/01/2009, 08/07/2010, 08/14/2011, 09/03/2011, 09/12/2012, 08/18/2013, 08/24/2014, 06/30/2015    Influenza Virus Quad Presv Free Vacc 3/> Yrs IM 12/11/2016    Influenza Virus Quadrivalent Vacc 3/> Yrs Im 06/30/2015, 07/02/2017    PNEUMOCOCCAL POLYSACCHARIDE VACCINE v23 03/12/2014, 08/15/2015, 12/01/2015    Pneumococcal Vaccine, Conjugate V7 01/31/2009    Td 11/01/2001    Tdap 09/17/2008    ZOSTER SHINGLES VACC, LIVE SC 08/24/2008, 12/01/2015    Zoster Vacc (HZV) Recomb Adjv, IM, 2 Dose, (SHINGRIX) 05/30/2017, 07/17/2017        Physical Examination:       ED Triage Vitals   ED Triage Vitals Brief Group      Temp 03/25/18 0404 98.1 F      Pulse 03/25/18 0404 95      Resp 03/25/18 0404 (!) 24      BP 03/25/18 0404 (!) 145/103      SpO2 03/25/18 0404 95 %      Pain Score 03/25/18 0616 0        GENERAL:   no acute distress, non-toxic   SKIN:  Warm & Dry, no rash  HEAD:  NCAT. Sclerae are anicteric and aninjected  NECK:  Supple. No stridor.  LUNGS:  Clear to auscultation bilaterally. No wheezes, rales, rhonchi.   HEART:  RRR.  No murmurs, rubs, or gallops.   ABDOMEN:  Soft, NTND.  No involuntary guarding or rebound.   EXTREMITIES:  No obvious deformities.  Warm and well perfused.  No edema.   NEUROLOGIC:  Alert and oriented x3; moves all extremities well; speaking in clear fluent sentences.  PSYCHIATRIC:  Appropriate for age, time of day, and situation    Medications Given in the ED:  Medications   aspirin chewable tablet 324 mg (324 mg Oral Given 03/25/18 0441)   lidocaine (XYLOCAINE) 2 % viscous solution 10 mL (10 mLs Mouth/Throat Given 03/25/18 0441)   aluminum-magnesium hydroxide (MAALOX) 200-200 mg/5 mL suspension 30 mL (30 mLs Oral Given 03/25/18 0441)   famotidine (PEPCID) tablet 20 mg (20 mg Oral Given 03/25/18 0521)   levofloxacin (LEVAQUIN) tablet 750 mg (750 mg Oral Given 03/25/18 0535)    Radiology and ECG:  X-ray: Chest (portable)          Electrocardiogram:  Normal sinus rhythm with a rate of 90, normal intervals, normal axis, no ischemic changes         Lab Results:  Labs Reviewed   CBC, PLATELET & DIFFERENTIAL - Abnormal; Notable for the following components:       Result Value    WHITE BLOOD CELL COUNT 14.4 (*)     RBC DISTRIBUTION WIDTH STD DEV 49.6 (*)     RBC DISTRIBUTION WIDTH 15.2 (*)     IMMATURE GRANULOCYTE % 0.8 (*)     ABSOLUTE  IMM GRAN COUNT 0.12 (*)     All other components within normal limits    Narrative:     Well's Risk Category:->Low (0-4)  Is patient PERC positive?->Yes  Is this D-Dimer needed to rule out Pulmonary Embolism or  DVT?->Yes  Current Anticoagulant->None   MAGNESIUM - Abnormal; Notable for the following components:    MAGNESIUM 1.6 (*)     All other components within normal limits   TROPONIN I   D-DIMER PE/DVT, QUANTITATIVE    Narrative:     Well's Risk Category:->Low (0-4)  Is patient PERC positive?->Yes  Is this D-Dimer needed to rule out Pulmonary Embolism or  DVT?->Yes  Current Anticoagulant->None   BASIC METABOLIC PANEL   TROPONIN I       Results for orders placed or performed during the hospital encounter of 03/25/18 (from the past 24 hour(s))   CBC, Platelet & Differential    Collection Time: 03/25/18  4:31 AM   Result Value    WHITE BLOOD CELL COUNT 14.4 (H)    RED BLOOD CELL COUNT 4.46    HEMOGLOBIN 13.4    HEMATOCRIT 40.7    MEAN CORPUSCULAR VOL 91.3    MEAN CORPUSCULAR HGB 30.0    MEAN CORP HGB CONC 32.9    RBC DISTRIBUTION WIDTH STD DEV 49.6 (H)    RBC DISTRIBUTION WIDTH 15.2 (H)    PLATELET COUNT 287    MEAN PLATELET VOLUME 11.2    NEUTROPHIL % 55.4    IMMATURE GRANULOCYTE % 0.8 (H)    LYMPHOCYTE % 32.5    MONOCYTE % 7.4    EOSINOPHIL % 3.7    BASOPHIL % 0.2    ABSOLUTE NEUTROPHIL COUNT 8.0    ABSOLUTE IMM GRAN COUNT 0.12 (H)    ABSOLUTE LYMPH COUNT 4.7    ABSOLUTE MONO COUNT 1.1    ABSOLUTE EOSINOPHIL COUNT 0.5    ABSOLUTE BASO COUNT 0.0    Narrative    Well's Risk Category:->Low (0-4)  Is patient PERC positive?->Yes  Is  this D-Dimer needed to rule out Pulmonary Embolism or  DVT?->Yes  Current Anticoagulant->None   Troponin I    Collection Time: 03/25/18  4:31 AM   Result Value    TROPONIN I < 0.02   D-Dimer PE/DVT, Quantitative    Collection Time: 03/25/18  4:31 AM   Result Value    D-DIMER PE/DVT, QUANTITATIVE 322    Narrative    Well's Risk Category:->Low (0-4)  Is patient PERC positive?->Yes  Is this D-Dimer needed to rule out Pulmonary Embolism or  DVT?->Yes  Current Anticoagulant->None   Basic Metabolic Panel    Collection Time: 03/25/18  4:31 AM   Result Value    SODIUM 141    POTASSIUM 4.3    CHLORIDE 103    CARBON DIOXIDE 25    ANION GAP 13    CALCIUM 9.4    Glucose Random 104    BUN (UREA NITROGEN) 12    CREATININE 0.6    ESTIMATED GLOMERULAR FILT RATE > 60   Magnesium    Collection Time: 03/25/18  4:31 AM   Result Value    MAGNESIUM 1.6 (L)    Vital Signs:  Patient Vitals for the past 720 hrs:   Temp Pulse Resp BP SpO2   03/25/18 0404 98.1 F 95 (!) 24 (!) 145/103 95 %   03/25/18 0431 -- 83 -- 113/70 96 %   03/25/18 0501 -- 86 -- 116/72 98 %   03/25/18 0531 --  82 -- 115/60 97 %   03/25/18 0601 -- 85 -- 117/65 97 %          ED Course and Medical Decision-making:  This 62 year old female patient presents with substernal chest pain.  Differential diagnosis includes cardiac ischemia, gastroesophageal reflux, muscle spasm, esophageal spasm, pericarditis.  Patient also with reported cough, so consideration of possible pneumonia.  I do not think her presentation is consistent with aortic dissection.     Chest x-ray obtained which shows right lower lobe consolidation on my preliminary read.  Given the patient's cough and shortness of breath, this may be consistent with pneumonia.  She will be started on a course of levofloxacin.    EKG is nonischemic.  Laboratory tests are reassuring including negative troponin.  Plan for repeat troponin after 3 hours.    Patient given a GI cocktail with resolution of her chest pain, but then  some epigastric discomfort.  Patient given dose of Pepcid.    Patient was passed off to my colleague, Dr. Allyson Sabal, at 7:18 AM.    Condition on Signout: Stable    Diagnosis/Diagnoses:  Chest pain    Medications administered at this visit:   Orders Placed This Encounter      aspirin chewable tablet 324 mg      lidocaine (XYLOCAINE) 2 % viscous solution 10 mL      aluminum-magnesium hydroxide (MAALOX) 200-200 mg/5 mL suspension 30 mL      famotidine (PEPCID) tablet 20 mg      levofloxacin (LEVAQUIN) tablet 750 mg          Order Specific Question: Specify indication:          Answer: Infection - organism unknown          Order Specific Question: Indicate type of infection:          Answer: Pneumonia - Community-acquired (non-ICU)          Order Specific Question: Indicate type of therapy:          Answer: New antimicrobial therapy      Clarita Crane, MD, MPH  03/25/2018   Dept.of Emergency [place]    This Emergency Department patient encounter note was created using voice-recognition software and in real time during the ED visit. Please excuse any typographical errors that have not yet been reviewed and corrected.

## 2018-03-25 NOTE — Narrator Note (Signed)
Pt to US.

## 2018-03-25 NOTE — ED Notes (Signed)
Transported to Radiology

## 2018-03-25 NOTE — Narrator Note (Signed)
Pt states chest pain is better but still has epigastric pain. MD aware.

## 2018-03-25 NOTE — Narrator Note (Signed)
Report given to next shift RN.

## 2018-03-25 NOTE — ED Provider Notes (Signed)
ED nursing record was reviewed. Prior records as available electronically through the Epic record were reviewed.      HPI:    This 62 year old female patient presents to the Emergency Department with chief complaint of shortness of breath and chest pain.  Patient points to just above her epigastric region.  She says that this evening she laid down in bed and suddenly felt sweaty and short of breath and developed pain in that area.  It is a stabbing pain.  She was seen here at 3 AM today for the same problem.  She says she was diagnosed with pneumonia.  She says they gave her medicine that helped her feel better.  She went home and took her antibiotic and was doing fine.  She had fettuccine [employer] for dinner.      ROS: Pertinent positives were reviewed as per the HPI above. All other systems were reviewed and are negative.      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) ([place])  09/14/2015: Cough  No date: Depression      Comment:  per steward records  No date: Diabetes  No date: Disorders of lipoid metabolism  No date: Esophageal reflux  No date: External hemorrhoid      Comment:  per steward records  No date: HTN (hypertension)  02/01/2012: Hypoxia  02/16/2017: Left ear pain  06/07/2015: Left genital labial abscess  06/16/2015: Obesity, Class III, BMI  40-49.9 (morbid obesity) ([place])  08/16/2015: Osteopenia      Comment:  On xray of left knee per steward records 06/2014 Unclear                if addressed  09/14/2015: Right ear impacted cerumen  08/15/2015: Type 2 diabetes mellitus without complication, without   long-term current use of insulin (HCC)      Comment:   HEMOGLOBIN A1C (%) Date Value 07/15/2015 6.2 (H)                ----------  Steward record - a1c 6.2 on 01/31/15 6.4 on                09/30/14 5.9 on 12/29/13 6.4 08/18/13   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) ([place])     Tobacco use disorder     Spinal stenosis of lumbar region     HLD (hyperlipidemia)     Chronic bilateral low back pain with bilateral sciatica     Skin change     Pineal gland cyst  History of vitamin D deficiency     Hemorrhoids     Venous (peripheral) insufficiency     Adrenal adenoma     Type 2 diabetes mellitus with microalbuminuria, without long-term current use of insulin (HCC)     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 (HCC)     Acute pain of right wrist     Abnormal PFTs     Screening for malignant neoplasm of breast     Skin irritation     H/O bone density study     Vertigo     Orthostatic hypotension     Social problem     Right arm weakness     Insomnia     Acute pain of left knee     Hx of total knee replacement, left     Acute bacterial sinusitis     Seasonal allergic rhinitis     Left foot pain     Stress due to family tension     Gastritis without bleeding     Leucocytosis     Viral URI with cough              Past Surgical History: Past Surgical History:  No date: ANES NERVE MUSC TENDON FASCIA&BURSA KNEE&/POPLT  No date: FOOT SURGERY      Comment:  bilateral  No date: LAPAROSCOPY SURG CHOLECYSTECTOMY  No date: OB ANTEPARTUM CARE CESAREAN DLVR & POSTPARTUM      Comment:  x3  No date: TONSILLECTOMY & ADENOIDECTOMY <AGE 59  04/13/2009: TOTAL KNEE REPLACEMENT      Comment:  left  08/11/2009: WRIST  GANGLION EXCISION      Comment:  left       Medications:   Current Facility-Administered Medications on File Prior to Encounter:  [COMPLETED] aspirin chewable tablet 324 mg 324 mg Oral Once Teressa Lower, MD 324 mg at 03/25/18 0441   [COMPLETED] lidocaine (XYLOCAINE) 2 % viscous solution 10 mL 10 mL Mouth/Throat Once Teressa Lower, MD 10 mL at 03/25/18 0441   [COMPLETED] aluminum-magnesium hydroxide (MAALOX) 200-200 mg/5 mL suspension 30 mL 30 mL Oral Once Teressa Lower, MD 30 mL at 03/25/18 0441   [COMPLETED] famotidine (PEPCID) tablet 20 mg 20 mg Oral Once Teressa Lower, MD 20 mg at 03/25/18 0521   [COMPLETED] levofloxacin (LEVAQUIN) tablet 750 mg 750 mg Oral Once Teressa Lower, MD 750 mg at 03/25/18 0254     Current Outpatient Medications on File Prior to Encounter:  levofloxacin (LEVAQUIN) 750 MG tablet Take 1 tablet by mouth daily for 4 days Disp: 4 tablet Rfl: 0   lisinopril (PRINIVIL,ZESTRIL) 20 MG tablet take 1 tablet by mouth once daily Disp: 90 tablet Rfl: 3   ONETOUCH DELICA LANCETS FINE MISC TEST once daily Disp: 100 each Rfl: 11   metFORMIN (GLUCOPHAGE-XR) 500 MG 24 hr tablet take 4 tablets by mouth once daily WITH BREAKFAST Disp: 360 tablet Rfl: 3   atorvastatin (LIPITOR) 20 MG tablet take 1 tablet by mouth once daily Disp: 90 tablet Rfl: 3   metoprolol (TOPROL-XL) 25 MG 24 hr tablet take 1/2 tablet by mouth at bedtime Disp: 45 tablet Rfl: 3   ONETOUCH VERIO test strip TEST once daily Disp: 100 strip Rfl: 11   albuterol (PROVENTIL HFA,VENTOLIN HFA, PROAIR HFA) 108 (90 Base) MCG/ACT inhaler Inhale 2 puffs into the lungs every 6 (six) hours as needed for Wheezing or Shortness of breath Disp: 1 Inhaler Rfl: 11   Omega-3  Fatty Acids (FISH OIL) 1000 MG CAPS Take by mouth Disp:  Rfl:    cholecalciferol (VITAMIN D3) 1000 UNIT tablet Take 1 tablet by mouth daily Disp:  Rfl:    pregabalin (LYRICA) 100 MG capsule Take 100 mg by mouth 2 (two) times daily With Dr Dwyane Dee Disp:  Rfl:     oxycodone-acetaminophen (PERCOCET) 5-325 MG per tablet Take 1 tablet by mouth 2 (two) times daily as needed With Dr Dwyane Dee Disp:  Rfl:    aspirin 81 MG tablet Take 81 mg by mouth daily. Disp:  Rfl:          Social History:   Social History     Socioeconomic History    Marital status: Divorced     Spouse name: Not on file    Number of children: Not on file    Years of education: Not on file    Highest education level: Not on file   Occupational History    Not on file   Social Needs    Financial resource strain: Not on file    Food insecurity:     Worry: Not on file     Inability: Not on file    Transportation needs:     Medical: Not on file     Non-medical: Not on file   Tobacco Use    Smoking status: Current Every Day Smoker     Packs/day: 0.50     Years: 31.00     Pack years: 15.50    Smokeless tobacco: Never Used    Tobacco comment: quit smoking inform provided to patient   Substance and Sexual Activity    Alcohol use: No    Drug use: No    Sexual activity: Not Currently     Partners: Male     Birth control/protection: Tubal Ligation   Lifestyle    Physical activity:     Days per week: Not on file     Minutes per session: Not on file    Stress: Not on file   Relationships    Social connections:     Talks on phone: Not on file     Gets together: Not on file     Attends religious service: Not on file     Active member of club or organization: Not on file     Attends meetings of clubs or organizations: Not on file     Relationship status: Not on file    Intimate partner violence:     Fear of current or ex partner: Not on file     Emotionally abused: Not on file     Physically abused: Not on file     Forced sexual activity: Not on file   Other Topics Concern    Not on file   Social History Narrative    Lives with son    1 daughter and another son passed away    Disabled from back    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 Simons,MD, 07/15/2015, 2:56 PM                Allergies:  Review of Patient's Allergies indicates:  Codeine camsylate       Shortness of Breath, Other (See                            Comments)    Comment:Chest pain   Motrin [ibuprofen]      Nausea Only   Bactrim                 Rash, Itching      Physical Exam:  BP 119/98  Pulse 107  Temp 97.9 F  Resp (!) 22  Wt 113.4 kg (250 lb)  LMP 10/22/2007  SpO2 97%  BMI 45.73 kg/m2    GENERAL: No acute distress.   SKIN:  Warm & Dry, no rash.  HEAD:  NCAT. Sclerae are anicteric and aninjected, oropharynx is clear with moist mucous membranes.     NECK:  Supple.  No meningismus.    LUNGS:  Clear to auscultation bilaterally.   HEART:  RRR.   ABDOMEN:  Soft, NTND.  No involuntary guarding or rebound.   EXTREMITIES:  No obvious deformities.   NEUROLOGIC:  Alert. Moves all extremities well. Speaking clearly.   PSYCHIATRIC:  Appropriate for age, time of day, and situation        ED Course and Medical Decision-making:  This 62 year old female patient presents with shortness of breath and epigastric/chest pain.  EKG shows sinus rhythm with a rate of 106.  No concerning ST or T wave changes.    On review of recent records she was seen here last night.  She was given aspirin and GI cocktail and felt better at that time.  She was diagnosed with pneumonia and sent home on Levaquin which she took.    Patient may be short of breath due to her pneumonia.  Chest x-ray was ordered to see if she was worsening or if she has developed another process such as pulmonary edema or pneumothorax.    Patient may be suffering from GERD, biliary colic, or cholecystitis.  She was given another dose of Pepcid and a GI cocktail and ultrasound was ordered for further evaluation.    Patient was signed out pending labs and reevaluation.    Diagnosis/Diagnoses:  Shortness  of breath, epigastric/chest pain    Azalia Bilis, MD

## 2018-03-25 NOTE — Narrator Note (Signed)
Labs drawn and sent with scheduled medications admin. Pt resting in bed at this time with no sign of distress and remains SR on cardiac monitor.

## 2018-03-25 NOTE — Narrator Note (Signed)
Pt resting comfortably in bed with no signs of distress.  On cardiac monitor.

## 2018-03-25 NOTE — ED Triage Note (Signed)
Pt self presents with SOB and midsternal CP. Pt was seen this AM here an was diagnosed with PNA. EKG done shown to MD.

## 2018-03-25 NOTE — ED Notes (Signed)
[place] [phone]

## 2018-03-25 NOTE — Narrator Note (Signed)
Patient Disposition    Patient education for diagnosis, medications, activity, diet and follow-up.  Patient left ED 9:05 AM.  Patient rep received written instructions.  Interpreter to provide instructions: No    Patient belongings with patient: YES    Have all existing LDAs been addressed? Yes    Have all IV infusions been stopped? Yes    Discharged to: Discharged to home    Verbal and written instructions given. Patient understands plan of care and reasons for returning to the ED. Encouraged patient to follow up with PCP.

## 2018-03-25 NOTE — ED Notes (Addendum)
Signout from Dr. Jani Files, patient is a 62 year old female seen in the emergency department with concern for epigastric pain/shortness of breath (being treated with levofloxacin for pneumonia).    EKG shows sinus tachycardia at 106, normal axis, QTC 448, no ST-T wave changes, no evidence of acute ischemia.    Labs Reviewed   CBC, PLATELET & DIFFERENTIAL - Abnormal; Notable for the following components:       Result Value    WHITE BLOOD CELL COUNT 12.2 (*)     RBC DISTRIBUTION WIDTH STD DEV 49.3 (*)     RBC DISTRIBUTION WIDTH 15.2 (*)     All other components within normal limits   COMPREHENSIVE METABOLIC PANEL - Abnormal; Notable for the following components:    BILIRUBIN TOTAL 0.1 (*)     All other components within normal limits   MAGNESIUM - Abnormal; Notable for the following components:    MAGNESIUM 1.7 (*)     All other components within normal limits   LIPASE   TROPONIN I   TROPONIN I     Chest x-ray appears unremarkable.    Abdominal ultrasound pending.    Abdominal ultrasound shows coarse slightly increased liver echotexture suggestive of nonspecific diffuse hepatic medical disease, common etiology would be steatosis; absent gallbladder.    Repeat EKG shows normal sinus rhythm at 92, normal axis, QTC 464, no ST-T wave changes, no evidence of acute ischemia.    Repeat troponin less than 0.02.    Plan to have patient follow up with PCP.  Patient stable for discharge home.

## 2018-03-26 LAB — TROPONIN I: TROPONIN I: <0.02 ng/mL (ref 0.00–0.04)

## 2018-03-26 LAB — US ABDOMEN W DUPLEX

## 2018-03-26 LAB — XR CHEST 2 VIEWS

## 2018-03-26 MED ORDER — MAGNESIUM OXIDE 400 (241.3 MG) MG PO TABS
800.00 mg | ORAL_TABLET | Freq: Once | ORAL | Status: AC
Start: 2018-03-26 — End: 2018-03-26
  Administered 2018-03-26: 800 mg via ORAL
  Filled 2018-03-26: qty 2

## 2018-03-26 MED ORDER — FAMOTIDINE 20 MG PO TABS: 20 mg | tablet | Freq: Two times a day (BID) | ORAL | 0 refills | 0 days | Status: DC

## 2018-03-26 MED ORDER — FAMOTIDINE 20 MG PO TABS
20.0000 mg | ORAL_TABLET | Freq: Two times a day (BID) | ORAL | 0 refills | Status: DC
Start: 2018-03-26 — End: 2018-03-28

## 2018-03-26 NOTE — Narrator Note (Signed)
Patient Disposition    Patient education for diagnosis, medications, activity, diet and follow-up.  Patient left ED 1:20 AM.  Patient rep received written instructions.  Interpreter to provide instructions: No    Patient belongings with patient: YES    Have all existing LDAs been addressed? Yes    Have all IV infusions been stopped? N/A    Discharged to: Discharged to home

## 2018-03-26 NOTE — Narrator Note (Addendum)
Pt wanting to leave. Pt sitting up on stretcher. At this time waiting on second result of troponin. No distress noted

## 2018-03-26 NOTE — Discharge Instructions (Signed)
No definite cause of your symptoms was discovered.  Abdominal ultrasound showed an absent gallbladder.  Serial troponins were less than 0.02.  Your symptoms might be due to acid reflux or gastritis.  Follow up with your primary doctor, and return to the emergency department for new or worsening symptoms.

## 2018-03-28 ENCOUNTER — Telehealth (HOSPITAL_BASED_OUTPATIENT_CLINIC_OR_DEPARTMENT_OTHER): Payer: Self-pay | Admitting: Registered Nurse

## 2018-03-28 ENCOUNTER — Ambulatory Visit (HOSPITAL_BASED_OUTPATIENT_CLINIC_OR_DEPARTMENT_OTHER): Payer: MEDICARE | Admitting: Internal Medicine

## 2018-03-28 VITALS — BP 95/75 | HR 103

## 2018-03-28 DIAGNOSIS — R000 Tachycardia, unspecified: Secondary | ICD-10-CM

## 2018-03-28 DIAGNOSIS — M940 Chondrocostal junction syndrome [Tietze]: Secondary | ICD-10-CM | POA: Diagnosis not present

## 2018-03-28 DIAGNOSIS — F410 Panic disorder [episodic paroxysmal anxiety] without agoraphobia: Secondary | ICD-10-CM | POA: Diagnosis not present

## 2018-03-28 DIAGNOSIS — J449 Chronic obstructive pulmonary disease, unspecified: Secondary | ICD-10-CM | POA: Diagnosis not present

## 2018-03-28 DIAGNOSIS — J4489 Other specified chronic obstructive pulmonary disease: Secondary | ICD-10-CM

## 2018-03-28 LAB — EKG

## 2018-03-28 MED ORDER — OMEPRAZOLE 20 MG PO TBEC
1.0000 | DELAYED_RELEASE_TABLET | Freq: Every day | ORAL | 1 refills | Status: DC
Start: 2018-03-28 — End: 2018-05-10

## 2018-03-28 MED ORDER — IPRATROPIUM-ALBUTEROL 0.5-2.5 (3) MG/3ML IN SOLN
3.00 mL | Freq: Once | RESPIRATORY_TRACT | Status: AC
Start: 2018-03-28 — End: 2018-03-28
  Administered 2018-03-28: 3 mL via RESPIRATORY_TRACT

## 2018-03-28 MED ORDER — OMEPRAZOLE 20 MG PO TBEC: 1 | tablet | Freq: Every day | ORAL | 1 refills | 0 days | Status: DC

## 2018-03-28 MED ORDER — NAPROXEN 500 MG PO TABS: 500 mg | tablet | Freq: Two times a day (BID) | ORAL | 0 refills | 0 days | Status: DC

## 2018-03-28 MED ORDER — METOPROLOL SUCCINATE ER 25 MG PO TB24: 25 mg | tablet | Freq: Every day | ORAL | 1 refills | 0 days | Status: AC

## 2018-03-28 MED ORDER — NAPROXEN 500 MG PO TABS
500.0000 mg | ORAL_TABLET | Freq: Two times a day (BID) | ORAL | 0 refills | Status: DC
Start: 2018-03-28 — End: 2018-05-10

## 2018-03-28 MED ORDER — METOPROLOL SUCCINATE ER 25 MG PO TB24
25.0000 mg | ORAL_TABLET | Freq: Every day | ORAL | 1 refills | Status: DC
Start: 2018-03-28 — End: 2018-09-17

## 2018-03-28 NOTE — Progress Notes (Signed)
Pt walked into clinic with c/o sob.  Previously treated for R lobe pna with levaquin.  Vitals 98.67f    95/75 left arm manual  [id number]      Pt reports feeling like she can't get something out of her chest.  Dr. Lisa Roca to see pt.

## 2018-03-28 NOTE — Progress Notes (Signed)
Subjective     Samantha Cameron is a 62 year old female presents for shortness of breath    Dry cough and stomach pain starting about 5/4.  Felt a little warm.    Seen in the hospital 5/7 after she woke up with chest pain and shortness of breath.  Found to have RLL consolidation on CXR.  EKG without signs of ischemia, and TnI neg x 2.  Improved with GI cocktail, treated with levofloxacin.    Patient started taking the medication in the morning and started taking the medication, woke up with chest pain/epigastric pain again, and went back to ED.  Repeat CXR clear and EKG OK.  US showed fatty liver.  WBC elevated at 14K first visit, 12K second visit.  She has had to sleep propped up on pillows since then.  Finished abx today.  Also taking famotidine bid.      Every time she lies down she feels epigastric/chest pain, has to sit up, and has lots of anxiety.  Cough nonproductive.  A little leg swelling. Exacerbated by drinking tea, afraid to eat food for fear that it will make it worse.  Feels like food gets stuck.    At baseline uses albuterol but no other inhalers. PFTs a few years ago showed no obstructive but mild restrictive dysfunction.   Was told to have sleep study in the past but never had it.     Social History     Socioeconomic History    Marital status: Divorced     Spouse name: Not on file    Number of children: Not on file    Years of education: Not on file    Highest education level: Not on file   Occupational History    Not on file   Social Needs    Financial resource strain: Not on file    Food insecurity:     Worry: Not on file     Inability: Not on file    Transportation needs:     Medical: Not on file     Non-medical: Not on file   Tobacco Use    Smoking status: Current Every Day Smoker     Packs/day: 0.50     Years: 31.00     Pack years: 15.50    Smokeless tobacco: Never Used    Tobacco comment: quit smoking inform provided to patient   Substance and Sexual Activity    Alcohol use: No     Drug use: No    Sexual activity: Not Currently     Partners: Male     Birth control/protection: Tubal Ligation   Lifestyle    Physical activity:     Days per week: Not on file     Minutes per session: Not on file    Stress: Not on file   Relationships    Social connections:     Talks on phone: Not on file     Gets together: Not on file     Attends religious service: Not on file     Active member of club or organization: Not on file     Attends meetings of clubs or organizations: Not on file     Relationship status: Not on file    Intimate partner violence:     Fear of current or ex partner: Not on file     Emotionally abused: Not on file     Physically abused: Not on file  Forced sexual activity: Not on file   Other Topics Concern    Not on file   Social History Narrative    Lives with son    1 daughter and another son passed away    Disabled from back    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) ([place])     Tobacco use disorder     Spinal stenosis of lumbar region     HLD (hyperlipidemia)     Chronic bilateral low back pain with bilateral sciatica     Skin change     Pineal gland cyst     History of vitamin D deficiency     Hemorrhoids     Venous (peripheral) insufficiency     Adrenal adenoma     Type 2 diabetes mellitus with microalbuminuria, without long-term current use of insulin (HCC)     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 (HCC)     Acute pain of right wrist     Abnormal PFTs     Screening for malignant neoplasm of breast     Skin irritation     H/O bone density study     Vertigo     Orthostatic hypotension     Social problem     Right arm  weakness     Insomnia     Acute pain of left knee     Hx of total knee replacement, left     Acute bacterial sinusitis     Seasonal allergic rhinitis     Left foot pain     Stress due to family tension     Gastritis without bleeding     Leucocytosis     Viral URI with cough    Review of patient's family history indicates:  Problem: Diabetes      Relation: Mother          Age of Onset: (Not Specified)  Problem: Heart      Relation: Brother          Age of Onset: (Not Specified)          Comment: CAD and AAA  Problem: Hypertension      Relation: Mother          Age of Onset: (Not Specified)  Problem: Arthritis      Relation: Mother          Age of Onset: (Not Specified)  Problem: Stroke      Relation: Mother          Age of Onset: (Not Specified)  Problem: Diabetes      Relation: Brother          Age of Onset: (Not Specified)          Comment: same brother  Problem: Cancer - Other      Relation: Father          Age of Onset: (Not Specified)          Comment: type uncertain  Problem: Thyroid  Relation: Sister          Age of Onset: (Not Specified)  Problem: Alcohol/Drug Abuse      Relation: Son          Age of Onset: (Not Specified)  Problem: OTHER      Relation: Son          Age of Onset: (Not Specified)          Comment: ?brain aneurysm per steward records  Problem: No Known Problems      Relation: Grandchild          Age of Onset: (Not Specified)          Comment: 6  Problem: Cancer - Breast      Relation: FamHxNeg          Age of Onset: (Not Specified)  Problem: Cancer - Cervical      Relation: FamHxNeg          Age of Onset: (Not Specified)  Problem: Cancer - Ovarian      Relation: FamHxNeg          Age of Onset: (Not Specified)  Problem: Cancer - Colon      Relation: FamHxNeg          Age of Onset: (Not Specified)  Problem: Psychiatric Illness      Relation: FamHxNeg          Age of Onset: (Not Specified)      Current Outpatient Medications:  famotidine (PEPCID) 20 MG tablet Take 1 tablet by mouth 2 (two)  times daily for 10 days Disp: 20 tablet Rfl: 0   levofloxacin (LEVAQUIN) 750 MG tablet Take 1 tablet by mouth daily for 4 days Disp: 4 tablet Rfl: 0   lisinopril (PRINIVIL,ZESTRIL) 20 MG tablet take 1 tablet by mouth once daily Disp: 90 tablet Rfl: 3   ONETOUCH DELICA LANCETS FINE MISC TEST once daily Disp: 100 each Rfl: 11   metFORMIN (GLUCOPHAGE-XR) 500 MG 24 hr tablet take 4 tablets by mouth once daily WITH BREAKFAST Disp: 360 tablet Rfl: 3   atorvastatin (LIPITOR) 20 MG tablet take 1 tablet by mouth once daily Disp: 90 tablet Rfl: 3   metoprolol (TOPROL-XL) 25 MG 24 hr tablet take 1/2 tablet by mouth at bedtime Disp: 45 tablet Rfl: 3   ONETOUCH VERIO test strip TEST once daily Disp: 100 strip Rfl: 11   albuterol (PROVENTIL HFA,VENTOLIN HFA, PROAIR HFA) 108 (90 Base) MCG/ACT inhaler Inhale 2 puffs into the lungs every 6 (six) hours as needed for Wheezing or Shortness of breath Disp: 1 Inhaler Rfl: 11   Omega-3 Fatty Acids (FISH OIL) 1000 MG CAPS Take by mouth Disp:  Rfl:    cholecalciferol (VITAMIN D3) 1000 UNIT tablet Take 1 tablet by mouth daily Disp:  Rfl:    pregabalin (LYRICA) 100 MG capsule Take 100 mg by mouth 2 (two) times daily With Dr Dwyane Dee Disp:  Rfl:    oxycodone-acetaminophen (PERCOCET) 5-325 MG per tablet Take 1 tablet by mouth 2 (two) times daily as needed With Dr Dwyane Dee Disp:  Rfl:    aspirin 81 MG tablet Take 81 mg by mouth daily. Disp:  Rfl:      No current facility-administered medications for this visit.   Review of Patient's Allergies indicates:   Codeine camsylate       Shortness of Breath, Other (See  Comments)    Comment:Chest pain   Motrin [ibuprofen]      Nausea Only   Bactrim                 Rash, Itching              Objective       BP 95/75  Pulse 103  LMP 10/22/2007  SpO2 93%   Physical Exam   Constitutional: No distress.   Overweight, anxious   HENT:   Head: Normocephalic and atraumatic.   Eyes: Conjunctivae are normal.   Cardiovascular:   No murmur  heard.  Tachycardic, regular   Pulmonary/Chest: Effort normal.   Tenderness over lower sternocostal junctions and xiphoid   Neurological: She is alert.   Psychiatric: She has a normal mood and affect.                Plan   Costochondritis, chest pain reproduced with palpation.  Ddx includes GERD, causing pseudo-orthopnea.  Pneumonia seems unlikely as she she has never had fever in the course of this illness and repeat CXR 1 day after starting treatment was clear.  I suspect she has some degree of COPD despite normal PFTs a few years ago, but lungs are currently clear and that seems unlikely to be causing her symptoms.  Acute coronary syndrome and pericarditis effectively ruled out through ED cardiac evaluation.  Anxiety is likely playing a role.  - naproxen bid.  Prior adverse reaction to ibuprofen was allergy only.  - change famotidine to omeprazole for gastric protection and to cover GERD  - Follow up if symptoms worsening or not improving as expected    Anxiety, tachycardia  - increase toprol to 25 mg daily    Possible OSA  Will reassess and likely refer for sleep study at next visit    We discussed the patients current medications. The patient expressed understanding and no barriers to adherence were identified.  1. The patient indicates understanding of these issues and agrees with the plan.  Brief care plan is updated and reviewed with the patient.   2. The patient is given an After Visit Summary sheet that lists all medications with directions, allergies, orders placed during this encounter, and follow-up instructions.   3. I reviewed the patient's medical information and medical history   4. I reconciled the patient's medication list and prepared and supplied needed refills.   5. I have reviewed the past medical, family, and social history sections including the medications and allergies.    Devota Pace, MD    No orders of the defined types were placed in this encounter.

## 2018-03-30 ENCOUNTER — Encounter (HOSPITAL_BASED_OUTPATIENT_CLINIC_OR_DEPARTMENT_OTHER): Payer: Self-pay | Admitting: Internal Medicine

## 2018-04-01 ENCOUNTER — Ambulatory Visit (HOSPITAL_BASED_OUTPATIENT_CLINIC_OR_DEPARTMENT_OTHER): Payer: MEDICARE | Admitting: Physician Assistant

## 2018-04-02 ENCOUNTER — Ambulatory Visit (HOSPITAL_BASED_OUTPATIENT_CLINIC_OR_DEPARTMENT_OTHER): Payer: MEDICARE | Admitting: Internal Medicine

## 2018-04-02 VITALS — BP 126/88 | HR 75 | Temp 96.6°F | Ht 60.83 in | Wt 235.6 lb

## 2018-04-02 DIAGNOSIS — R0601 Orthopnea: Secondary | ICD-10-CM | POA: Diagnosis not present

## 2018-04-02 DIAGNOSIS — R002 Palpitations: Secondary | ICD-10-CM | POA: Diagnosis not present

## 2018-04-02 DIAGNOSIS — M940 Chondrocostal junction syndrome [Tietze]: Secondary | ICD-10-CM | POA: Diagnosis not present

## 2018-04-02 DIAGNOSIS — R0600 Dyspnea, unspecified: Secondary | ICD-10-CM | POA: Diagnosis not present

## 2018-04-02 MED ORDER — HYDROXYZINE HCL 10 MG PO TABS: 10 mg | tablet | Freq: Every day | ORAL | 0 refills | 0 days | Status: DC | PRN

## 2018-04-02 MED ORDER — DICLOFENAC SODIUM 1 % TD GEL
4.0000 g | Freq: Two times a day (BID) | TRANSDERMAL | 0 refills | Status: DC
Start: 2018-04-02 — End: 2018-05-07

## 2018-04-02 MED ORDER — DICLOFENAC SODIUM 1 % TD GEL: 4 g | g | Freq: Two times a day (BID) | 0 refills | 0 days | Status: DC

## 2018-04-02 MED ORDER — HYDROXYZINE HCL 10 MG PO TABS
10.0000 mg | ORAL_TABLET | Freq: Every day | ORAL | 0 refills | Status: DC | PRN
Start: 2018-04-02 — End: 2018-05-07

## 2018-04-02 NOTE — Progress Notes (Signed)
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  Also gets winded when walking outside  This never happened before  Also having some dizzy spells after standing up too fast or when bending over    Pain in chest is improved  Vomited once yesterday    Social History     Socioeconomic History    Marital status: Divorced     Spouse name: Not on file    Number of children: Not on file    Years of education: Not on file    Highest education level: Not on file   Occupational History    Not on file   Social Needs    Financial resource strain: Not on file    Food insecurity:     Worry: Not on file     Inability: Not on file    Transportation needs:     Medical: Not on file     Non-medical: Not on file   Tobacco Use    Smoking status: Current Every Day Smoker     Packs/day: 0.50     Years: 31.00     Pack years: 15.50    Smokeless tobacco: Never Used    Tobacco comment: quit smoking inform provided to patient   Substance and Sexual Activity    Alcohol use: No    Drug use: No    Sexual activity: Not Currently     Partners: Male     Birth control/protection: Tubal Ligation   Lifestyle    Physical activity:     Days per week: Not on file     Minutes per session: Not on file    Stress: Not on file   Relationships    Social connections:     Talks on phone: Not on file     Gets together: Not on file     Attends religious service: Not on file     Active member of club or organization: Not on file     Attends meetings of clubs or organizations: Not on file     Relationship status: Not on file    Intimate partner violence:     Fear of current or ex partner: Not on file     Emotionally abused: Not on file     Physically abused: Not on file     Forced  sexual activity: Not on file   Other Topics Concern    Not on file   Social History Narrative    Lives with son    1 daughter and another son passed away    Disabled from back    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) ([place])  Tobacco use disorder     Spinal stenosis of lumbar region     HLD (hyperlipidemia)     Chronic bilateral low back pain with bilateral sciatica     Skin change     Pineal gland cyst     History of vitamin D deficiency     Hemorrhoids     Venous (peripheral) insufficiency     Adrenal adenoma     Type 2 diabetes mellitus with microalbuminuria, without long-term current use of insulin (HCC)     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 (HCC)     Acute pain of right wrist     Abnormal PFTs     Screening for malignant neoplasm of breast     Skin irritation     H/O bone density study     Vertigo     Orthostatic hypotension     Social problem     Right arm weakness     Insomnia     Acute pain of left knee     Hx of total knee replacement, left     Acute bacterial sinusitis     Seasonal allergic rhinitis     Left foot pain     Stress due to family tension     Gastritis without bleeding     Leucocytosis     Viral URI with cough    Review of patient's family history indicates:  Problem: Diabetes      Relation: Mother          Age of Onset: (Not Specified)  Problem: Heart      Relation: Brother          Age of Onset: (Not Specified)          Comment: CAD and AAA  Problem: Hypertension      Relation: Mother          Age of Onset: (Not Specified)  Problem: Arthritis      Relation: Mother          Age of Onset: (Not Specified)  Problem: Stroke       Relation: Mother          Age of Onset: (Not Specified)  Problem: Diabetes      Relation: Brother          Age of Onset: (Not Specified)          Comment: same brother  Problem: Cancer - Other      Relation: Father          Age of Onset: (Not Specified)          Comment: type uncertain  Problem: Thyroid      Relation: Sister          Age of Onset: (Not Specified)  Problem: Alcohol/Drug Abuse      Relation: Son          Age of Onset: (Not Specified)  Problem: OTHER      Relation: Son          Age of Onset: (Not Specified)          Comment: ?brain aneurysm per steward records  Problem: No Known Problems      Relation: Grandchild          Age of Onset: (Not Specified)          Comment: 6  Problem: Cancer - Breast      Relation: FamHxNeg  Age of Onset: (Not Specified)  Problem: Cancer - Cervical      Relation: FamHxNeg          Age of Onset: (Not Specified)  Problem: Cancer - Ovarian      Relation: FamHxNeg          Age of Onset: (Not Specified)  Problem: Cancer - Colon      Relation: FamHxNeg          Age of Onset: (Not Specified)  Problem: Psychiatric Illness      Relation: FamHxNeg          Age of Onset: (Not Specified)      Current Outpatient Medications:  metoprolol (TOPROL-XL) 25 MG 24 hr tablet Take 1 tablet by mouth daily Disp: 90 tablet Rfl: 1   Omeprazole 20 MG TBEC Take 1 tablet by mouth daily Take 30 minutes before breakfast Disp: 30 tablet Rfl: 1   naproxen (NAPROSYN) 500 MG tablet Take 1 tablet by mouth 2 (two) times daily with meals Disp: 60 tablet Rfl: 0   lisinopril (PRINIVIL,ZESTRIL) 20 MG tablet take 1 tablet by mouth once daily Disp: 90 tablet Rfl: 3   ONETOUCH DELICA LANCETS FINE MISC TEST once daily Disp: 100 each Rfl: 11   metFORMIN (GLUCOPHAGE-XR) 500 MG 24 hr tablet take 4 tablets by mouth once daily WITH BREAKFAST Disp: 360 tablet Rfl: 3   atorvastatin (LIPITOR) 20 MG tablet take 1 tablet by mouth once daily Disp: 90 tablet Rfl: 3   ONETOUCH VERIO test strip TEST once daily Disp: 100  strip Rfl: 11   albuterol (PROVENTIL HFA,VENTOLIN HFA, PROAIR HFA) 108 (90 Base) MCG/ACT inhaler Inhale 2 puffs into the lungs every 6 (six) hours as needed for Wheezing or Shortness of breath Disp: 1 Inhaler Rfl: 11   Omega-3 Fatty Acids (FISH OIL) 1000 MG CAPS Take by mouth Disp:  Rfl:    cholecalciferol (VITAMIN D3) 1000 UNIT tablet Take 1 tablet by mouth daily Disp:  Rfl:    pregabalin (LYRICA) 100 MG capsule Take 100 mg by mouth 2 (two) times daily With Dr Dwyane Dee Disp:  Rfl:    oxycodone-acetaminophen (PERCOCET) 5-325 MG per tablet Take 1 tablet by mouth 2 (two) times daily as needed With Dr Dwyane Dee Disp:  Rfl:    aspirin 81 MG tablet Take 81 mg by mouth daily. Disp:  Rfl:      No current facility-administered medications for this visit.   Review of Patient's Allergies indicates:   Codeine camsylate       Shortness of Breath, Other (See                            Comments)    Comment:Chest pain   Motrin [ibuprofen]      Nausea Only   Bactrim                 Rash, Itching              Objective     BP 126/88 (Site: LA, Position: Sitting, Cuff Size: Lrg)  Pulse 75  Temp 96.6 F (35.9 C) (Temporal)  Ht 5' 0.83" (1.545 m)  Wt 106.9 kg (235 lb 9.6 oz)  LMP 10/22/2007  SpO2 98%  BMI 44.77 kg/m2    Physical Exam   Constitutional: No distress.   Overweight, anxious   HENT:   Head: Normocephalic and atraumatic.   Eyes: Conjunctivae are normal.   Cardiovascular:  No murmur heard.  Tachycardic, regular   Pulmonary/Chest: Effort normal.   Tenderness over lower sternocostal junctions and xiphoid   Musculoskeletal: Edema: nonpitting edema of both legs.   Neurological: She is alert.   Psychiatric: She has a normal mood and affect.            Plan   Costochondritis, orthopnea  Slight improvement in the costochondritis pain on naproxen, but no real improvement in orthopnea and PND so plan to evaluate further  - check echo and holter  - referred for sleep study  - she got some symptomatic relief from neb previously, so even  though she had normal PFTs in the past she may have some degree of airway obstruction  - nebulizer machine + albuterol    We discussed the patients current medications. The patient expressed understanding and no barriers to adherence were identified.  1. The patient indicates understanding of these issues and agrees with the plan.  Brief care plan is updated and reviewed with the patient.   2. The patient is given an After Visit Summary sheet that lists all medications with directions, allergies, orders placed during this encounter, and follow-up instructions.   3. I reviewed the patient's medical information and medical history   4. I reconciled the patient's medication list and prepared and supplied needed refills.   5. I have reviewed the past medical, family, and social history sections including the medications and allergies.    Devota Pace, MD        No orders of the defined types were placed in this encounter.

## 2018-04-04 MED ORDER — ALBUTEROL SULFATE (2.5 MG/3ML) 0.083% IN NEBU
2.5000 mg | INHALATION_SOLUTION | Freq: Four times a day (QID) | RESPIRATORY_TRACT | 1 refills | Status: DC | PRN
Start: 2018-04-04 — End: 2019-04-03

## 2018-04-04 MED ORDER — ALBUTEROL SULFATE (2.5 MG/3ML) 0.083% IN NEBU: 3 mg | mL | Freq: Four times a day (QID) | RESPIRATORY_TRACT | 1 refills | 0 days | Status: AC | PRN

## 2018-04-07 ENCOUNTER — Telehealth (HOSPITAL_BASED_OUTPATIENT_CLINIC_OR_DEPARTMENT_OTHER): Payer: Self-pay

## 2018-04-07 DIAGNOSIS — M5136 Other intervertebral disc degeneration, lumbar region: Secondary | ICD-10-CM | POA: Diagnosis not present

## 2018-04-07 DIAGNOSIS — M5416 Radiculopathy, lumbar region: Secondary | ICD-10-CM | POA: Diagnosis not present

## 2018-04-07 DIAGNOSIS — G8929 Other chronic pain: Secondary | ICD-10-CM | POA: Diagnosis not present

## 2018-04-07 DIAGNOSIS — M47817 Spondylosis without myelopathy or radiculopathy, lumbosacral region: Secondary | ICD-10-CM | POA: Diagnosis not present

## 2018-04-07 NOTE — Telephone Encounter (Signed)
-----  Message from [place] sent at 04/04/2018  5:16 PM EDT -----  Regarding: nonurgent nebulizer  Can you see if you can get her a nebulizer?  Dx COPD  nonurgent

## 2018-04-07 NOTE — Progress Notes (Signed)
DWO printed and placed to be signed and faxed.

## 2018-04-07 NOTE — Progress Notes (Signed)
Called [facility], they cannot bill to medicare, advised I try [facility]: [phone].   [employer], spoke with [facility], they do not provide nebuiizers to [place] patients in [place], suggested [facility].    Called [facility], waited on hold for 10 minutes then answering service picked up, left message for call back.

## 2018-04-07 NOTE — Telephone Encounter (Signed)
-----   Message from Wetzel Bjornstad sent at 04/07/2018  9:59 AM EDT -----  Regarding: DWO Form  Good morning,    Please print out completed DWO form in [place], have doctor sign, and fax to [phone].    Thanks

## 2018-04-08 MED ORDER — NEBULIZERS MISC
1.0000 | Freq: Once | 0 refills | Status: AC
Start: 2018-04-08 — End: 2018-04-08

## 2018-04-08 MED ORDER — NEBULIZERS MISC: 1 | each | Freq: Once | 0 refills | 0 days | Status: AC

## 2018-04-08 NOTE — Progress Notes (Signed)
Prescription printed and placed in outbox to be faxed

## 2018-04-09 NOTE — Progress Notes (Signed)
Received call from [place] at [facility] - she needs the patients demographics and office visit notes along with patients med list faxed to her before they can proceed with filling the nebulizer. Fax # is [phone]  - if you have any additional questions, she can be reached at [phone]    Thank you

## 2018-04-10 DIAGNOSIS — M47817 Spondylosis without myelopathy or radiculopathy, lumbosacral region: Secondary | ICD-10-CM | POA: Diagnosis not present

## 2018-04-10 NOTE — Progress Notes (Signed)
Notes and demographics faxed to [facility].    Dayton Martes, RN

## 2018-04-28 ENCOUNTER — Encounter (HOSPITAL_BASED_OUTPATIENT_CLINIC_OR_DEPARTMENT_OTHER): Payer: Self-pay | Admitting: Internal Medicine

## 2018-04-28 NOTE — Progress Notes (Signed)
Form printed and will be places on provider's [place] folder.  Micheline Rough, [place], 04/28/2018

## 2018-04-28 NOTE — Progress Notes (Signed)
DWO form for nebulizer received  Submitted with last office note to be faxed and scanned  Samantha Cameron P. Donald Jacque,MD, 04/28/2018

## 2018-04-28 NOTE — Progress Notes (Signed)
Patient called stating that she still has not been able to get the nebulizer machine.      Called [facility] and they have not received the script or the notes and demographics.      DWO form was scanned into [facility] ([place] form for Nebulizer).    Please fill out, sign, and fax to [phone].    Please fax DWO form along with current med list, office notes stating patient will be using a nebulizer machine, and demographics to [place].    Thank you

## 2018-04-29 ENCOUNTER — Ambulatory Visit: Payer: Self-pay | Admitting: Internal Medicine

## 2018-04-29 DIAGNOSIS — R0602 Shortness of breath: Secondary | ICD-10-CM | POA: Diagnosis not present

## 2018-04-29 DIAGNOSIS — R002 Palpitations: Secondary | ICD-10-CM

## 2018-04-29 DIAGNOSIS — R0601 Orthopnea: Secondary | ICD-10-CM | POA: Diagnosis not present

## 2018-04-29 LAB — ECHOCARDIOGRAM W/ DOPPLER: LVEF: 65

## 2018-04-29 NOTE — Addendum Note (Signed)
Addended by: Dereck Leep on: 04/29/2018 11:01 AM     Modules accepted: Orders

## 2018-04-29 NOTE — Procedures (Signed)
Conclusions: (click link below for full report)    1. Left Ventricle: Global systolic function: EF is estimated visually at 65%.    2. Left Ventricle: Regional systolic function: Wall motion: There are no regional   wall motion abnormalities.    3. Tricuspid Valve: The RVSP is estimated at 16 mmHg which is normal.    4. No valvular abnormalities.    5. Compared to previews echo dated 05/28/2016, there is not significant change.

## 2018-04-30 NOTE — Progress Notes (Signed)
Please send letter (do not include details of result):    Your echocardiogram is normal.  Your shortness of breath is not caused by any problem in the heart.

## 2018-05-02 LAB — HOLTER MONITORING

## 2018-05-02 NOTE — Progress Notes (Signed)
Received a phone call from [facility] that they need office notes regarding nebulizer machine.     Scanned office notes from 04/02/18 into [facility]. (office notes for nebulizer)      [facility] needs to have doctor's signature on the office notes.    Please fax to [phone] as soon as possible. Patient has been waiting to get the machine. Thank you

## 2018-05-02 NOTE — Progress Notes (Signed)
Received forms for medical equipment.    Scanned forms into [facility] ([facility] forms).    Please complete, sign, and fax back to [phone]

## 2018-05-02 NOTE — Progress Notes (Signed)
Notes signed and placed in out-box to be faxed

## 2018-05-02 NOTE — Progress Notes (Signed)
Office notes are electronically signed.  Printed and placed out to be faxed, made note that it is electronically signed.

## 2018-05-05 NOTE — Progress Notes (Signed)
Received call from [facility], they are still watiing on forms to be signed and faxed back that were scanned into media manager on Friday 05/02/18

## 2018-05-05 NOTE — Progress Notes (Signed)
Samantha Cameron - can you print and complete if this is needed today  Otherwise I can complete Tuesday if back in clinic

## 2018-05-05 NOTE — Progress Notes (Signed)
Form signed and placed in out-box to be faxed.

## 2018-05-06 DIAGNOSIS — M48062 Spinal stenosis, lumbar region with neurogenic claudication: Secondary | ICD-10-CM | POA: Diagnosis not present

## 2018-05-07 ENCOUNTER — Other Ambulatory Visit (HOSPITAL_BASED_OUTPATIENT_CLINIC_OR_DEPARTMENT_OTHER): Payer: Self-pay | Admitting: Internal Medicine

## 2018-05-08 ENCOUNTER — Telehealth (HOSPITAL_BASED_OUTPATIENT_CLINIC_OR_DEPARTMENT_OTHER): Payer: Self-pay

## 2018-05-08 NOTE — Progress Notes (Signed)
PER Pharmacy, Samantha Cameron is a 62 year old female has requested a refill of diclofenac 1% gel and hydroxyzine 10 mg tablet .      Last Office Visit: 04/02/2018 with Devota Pace  Last Physical Exam: 08/15/2015      Other Med Adult:  Most Recent BP Reading(s)  04/02/18 : 126/88        Cholesterol (mg/dL)   Date Value   10/28/2017 131     LOW DENSITY LIPOPROTEIN DIRECT (mg/dL)   Date Value   10/28/2017 61     HIGH DENSITY LIPOPROTEIN (mg/dL)   Date Value   10/28/2017 47     TRIGLYCERIDES (mg/dL)   Date Value   10/28/2017 240 (H)         THYROID SCREEN TSH REFLEX FT4 (uIU/mL)   Date Value   04/12/2016 1.260         No results found for: TSH    HEMOGLOBIN A1C (%)   Date Value   10/28/2017 6.1 (H)       No results found for: POCA1C      INR (no units)   Date Value   04/18/2017 1.0   05/27/2016 1.0   02/01/2012 < 1.0 (L)       SODIUM (mmol/L)   Date Value   03/25/2018 142       POTASSIUM (mmol/L)   Date Value   03/25/2018 4.1           CREATININE (mg/dL)   Date Value   03/25/2018 0.7       Documented patient preferred pharmacies:    [facility] [place], [place]  Phone: [phone] Fax: [phone]

## 2018-05-08 NOTE — Progress Notes (Signed)
Prior authorization required for medication: albuterol (PROVENTIL) (2.5 MG/3ML) 0.083% nebulizer solution               Membership ID (insurance): Medicare A&B - [id number]   if Medicare insurance , Prescription drug plan: Unknown

## 2018-05-09 ENCOUNTER — Ambulatory Visit (HOSPITAL_BASED_OUTPATIENT_CLINIC_OR_DEPARTMENT_OTHER): Payer: Self-pay | Admitting: Registered Nurse

## 2018-05-09 ENCOUNTER — Inpatient Hospital Stay (HOSPITAL_BASED_OUTPATIENT_CLINIC_OR_DEPARTMENT_OTHER)
Admission: RE | Admit: 2018-05-09 | Disposition: A | Discharge status: Home / Self Care | Payer: Self-pay | Source: Emergency Department

## 2018-05-09 ENCOUNTER — Encounter (HOSPITAL_BASED_OUTPATIENT_CLINIC_OR_DEPARTMENT_OTHER): Payer: Self-pay

## 2018-05-09 DIAGNOSIS — I10 Essential (primary) hypertension: Secondary | ICD-10-CM | POA: Diagnosis not present

## 2018-05-09 DIAGNOSIS — I249 Acute ischemic heart disease, unspecified: Secondary | ICD-10-CM | POA: Diagnosis not present

## 2018-05-09 DIAGNOSIS — E119 Type 2 diabetes mellitus without complications: Secondary | ICD-10-CM | POA: Diagnosis not present

## 2018-05-09 DIAGNOSIS — R0789 Other chest pain: Secondary | ICD-10-CM | POA: Diagnosis not present

## 2018-05-09 DIAGNOSIS — Z8249 Family history of ischemic heart disease and other diseases of the circulatory system: Secondary | ICD-10-CM | POA: Diagnosis not present

## 2018-05-09 DIAGNOSIS — K219 Gastro-esophageal reflux disease without esophagitis: Secondary | ICD-10-CM | POA: Diagnosis not present

## 2018-05-09 DIAGNOSIS — R12 Heartburn: Secondary | ICD-10-CM | POA: Diagnosis not present

## 2018-05-09 DIAGNOSIS — M5442 Lumbago with sciatica, left side: Secondary | ICD-10-CM | POA: Diagnosis not present

## 2018-05-09 DIAGNOSIS — J449 Chronic obstructive pulmonary disease, unspecified: Secondary | ICD-10-CM | POA: Diagnosis not present

## 2018-05-09 DIAGNOSIS — R1084 Generalized abdominal pain: Secondary | ICD-10-CM | POA: Diagnosis not present

## 2018-05-09 DIAGNOSIS — R110 Nausea: Secondary | ICD-10-CM | POA: Diagnosis not present

## 2018-05-09 DIAGNOSIS — F17210 Nicotine dependence, cigarettes, uncomplicated: Secondary | ICD-10-CM | POA: Diagnosis not present

## 2018-05-09 DIAGNOSIS — R1013 Epigastric pain: Secondary | ICD-10-CM | POA: Diagnosis not present

## 2018-05-09 DIAGNOSIS — Z6841 Body Mass Index (BMI) 40.0 and over, adult: Secondary | ICD-10-CM | POA: Diagnosis not present

## 2018-05-09 DIAGNOSIS — R0602 Shortness of breath: Secondary | ICD-10-CM | POA: Diagnosis not present

## 2018-05-09 DIAGNOSIS — M5441 Lumbago with sciatica, right side: Secondary | ICD-10-CM | POA: Diagnosis not present

## 2018-05-09 DIAGNOSIS — R079 Chest pain, unspecified: Secondary | ICD-10-CM | POA: Diagnosis not present

## 2018-05-09 LAB — COMPREHENSIVE METABOLIC PANEL
ALANINE AMINOTRANSFERASE: 30 U/L (ref 12–45)
ALBUMIN: 3.8 g/dL (ref 3.4–5.0)
ALKALINE PHOSPHATASE: 88 U/L (ref 45–117)
ANION GAP: 11 mmol/L (ref 5–15)
ASPARTATE AMINOTRANSFERASE: 28 U/L (ref 8–34)
BILIRUBIN TOTAL: 0.3 mg/dL (ref 0.2–1.0)
BUN (UREA NITROGEN): 15 mg/dL (ref 7–18)
CALCIUM: 9 mg/dL (ref 8.5–10.1)
CARBON DIOXIDE: 25 mmol/L (ref 21–32)
CHLORIDE: 107 mmol/L (ref 98–107)
CREATININE: 0.6 mg/dL (ref 0.4–1.2)
ESTIMATED GLOMERULAR FILT RATE: >60 mL/min (ref >60)
Glucose Random: 110 mg/dL (ref 74–160)
POTASSIUM: 5 mmol/L (ref 3.5–5.1)
SODIUM: 143 mmol/L (ref 136–145)
TOTAL PROTEIN: 6.9 g/dL (ref 6.4–8.2)

## 2018-05-09 LAB — CBC, PLATELET & DIFFERENTIAL
ABSOLUTE BASO COUNT: 0 10*3/uL (ref 0.0–0.1)
ABSOLUTE EOSINOPHIL COUNT: 1 10*3/uL — ABNORMAL HIGH (ref 0.0–0.8)
ABSOLUTE IMM GRAN COUNT: 0.03 10*3/uL (ref 0.00–0.03)
ABSOLUTE LYMPH COUNT: 2.6 10*3/uL (ref 0.6–5.9)
ABSOLUTE MONO COUNT: 0.9 10*3/uL (ref 0.2–1.4)
ABSOLUTE NEUTROPHIL COUNT: 7.3 10*3/uL (ref 1.6–8.3)
BASOPHIL %: 0.3 % (ref 0.0–1.2)
EOSINOPHIL %: 8.6 % — ABNORMAL HIGH (ref 0.0–7.0)
HEMATOCRIT: 38.4 % (ref 34.1–44.9)
HEMOGLOBIN: 12.3 g/dL (ref 11.2–15.7)
IMMATURE GRANULOCYTE %: 0.3 % (ref 0.0–0.4)
LYMPHOCYTE %: 21.7 % (ref 15.0–54.0)
MEAN CORP HGB CONC: 32 g/dL (ref 31.0–37.0)
MEAN CORPUSCULAR HGB: 29.4 pg (ref 26.0–34.0)
MEAN CORPUSCULAR VOL: 91.9 fL (ref 80.0–100.0)
MEAN PLATELET VOLUME: 11 fL (ref 8.7–12.5)
MONOCYTE %: 7.6 % (ref 4.0–13.0)
NEUTROPHIL %: 61.5 % (ref 40.0–75.0)
PLATELET COUNT: 325 10*3/uL (ref 150–400)
RBC DISTRIBUTION WIDTH STD DEV: 47.9 fL — ABNORMAL HIGH (ref 35.1–46.3)
RBC DISTRIBUTION WIDTH: 14.7 % — ABNORMAL HIGH (ref 11.5–14.3)
RED BLOOD CELL COUNT: 4.18 M/uL (ref 3.90–5.20)
WHITE BLOOD CELL COUNT: 11.8 10*3/uL — ABNORMAL HIGH (ref 4.0–11.0)

## 2018-05-09 LAB — NT-PROBNP: NT-proBNP: 114 pg/mL (ref 0–125)

## 2018-05-09 LAB — TROPONIN I: TROPONIN I: <0.02 ng/mL (ref 0.00–0.04)

## 2018-05-09 LAB — MAGNESIUM: MAGNESIUM: 1.6 mg/dL — ABNORMAL LOW (ref 1.8–2.4)

## 2018-05-09 LAB — LIPASE: LIPASE: 185 U/L (ref 73–393)

## 2018-05-09 MED ORDER — SUCRALFATE 1 GM/10ML PO SUSP
1.0000 g | Freq: Four times a day (QID) | ORAL | Status: DC
Start: 2018-05-09 — End: 2018-05-09

## 2018-05-09 MED ORDER — HEPARIN SODIUM (PORCINE) 5000 UNIT/ML IJ SOLN
5000.0000 [IU] | Freq: Two times a day (BID) | INTRAMUSCULAR | Status: DC
Start: 2018-05-09 — End: 2018-05-10
  Administered 2018-05-10 (×2): 5000 [IU] via SUBCUTANEOUS
  Filled 2018-05-09 (×2): qty 1

## 2018-05-09 MED ORDER — ASPIRIN 81 MG PO CHEW
81.0000 mg | CHEWABLE_TABLET | Freq: Every day | ORAL | Status: DC
Start: 2018-05-10 — End: 2018-05-10
  Administered 2018-05-10: 81 mg via ORAL
  Filled 2018-05-09: qty 1

## 2018-05-09 MED ORDER — LISINOPRIL 20 MG PO TABS
20.0000 mg | ORAL_TABLET | Freq: Every day | ORAL | Status: DC
Start: 2018-05-10 — End: 2018-05-10
  Administered 2018-05-10: 20 mg via ORAL
  Filled 2018-05-09: qty 1

## 2018-05-09 MED ORDER — OMEPRAZOLE 20 MG PO CPDR
20.0000 mg | DELAYED_RELEASE_CAPSULE | Freq: Every morning | ORAL | Status: DC
Start: 2018-05-10 — End: 2018-05-09

## 2018-05-09 MED ORDER — ONDANSETRON HCL 4 MG/2ML IJ SOLN
4.0000 mg | Freq: Four times a day (QID) | INTRAMUSCULAR | Status: DC | PRN
Start: 2018-05-09 — End: 2018-05-10

## 2018-05-09 MED ORDER — PANTOPRAZOLE SODIUM 40 MG PO TBEC
40.0000 mg | DELAYED_RELEASE_TABLET | Freq: Every day | ORAL | Status: DC
Start: 2018-05-10 — End: 2018-05-10
  Administered 2018-05-10: 40 mg via ORAL
  Filled 2018-05-09: qty 1

## 2018-05-09 MED ORDER — METOPROLOL SUCCINATE ER 25 MG PO TB24
25.0000 mg | ORAL_TABLET | Freq: Every day | ORAL | Status: DC
Start: 2018-05-10 — End: 2018-05-10
  Administered 2018-05-10: 25 mg via ORAL
  Filled 2018-05-09: qty 1

## 2018-05-09 MED ORDER — ALBUTEROL SULFATE (2.5 MG/3ML) 0.083% IN NEBU
2.5000 mg | INHALATION_SOLUTION | Freq: Four times a day (QID) | RESPIRATORY_TRACT | Status: DC | PRN
Start: 2018-05-09 — End: 2018-05-10

## 2018-05-09 MED ORDER — PREGABALIN 100 MG PO CAPS
100.0000 mg | ORAL_CAPSULE | Freq: Two times a day (BID) | ORAL | Status: DC
Start: 2018-05-09 — End: 2018-05-10
  Administered 2018-05-10 (×2): 100 mg via ORAL
  Filled 2018-05-09 (×2): qty 1

## 2018-05-09 MED ORDER — MAGNESIUM SULFATE IN D5W 1-5 GM/100ML-% IV SOLN
1.00 g | Freq: Once | INTRAVENOUS | Status: AC
Start: 2018-05-09 — End: 2018-05-09
  Administered 2018-05-09: 1 g via INTRAVENOUS
  Filled 2018-05-09: qty 100

## 2018-05-09 MED ORDER — ACETAMINOPHEN 325 MG PO TABS
650.0000 mg | ORAL_TABLET | Freq: Four times a day (QID) | ORAL | Status: DC | PRN
Start: 2018-05-09 — End: 2018-05-10

## 2018-05-09 MED ORDER — HYDROXYZINE HCL 10 MG PO TABS
10.0000 mg | ORAL_TABLET | Freq: Three times a day (TID) | ORAL | Status: DC | PRN
Start: 2018-05-09 — End: 2018-05-10

## 2018-05-09 MED ORDER — ONDANSETRON HCL 4 MG/2ML IJ SOLN
4.00 mg | Freq: Once | INTRAMUSCULAR | Status: AC
Start: 2018-05-09 — End: 2018-05-09
  Administered 2018-05-09: 4 mg via INTRAVENOUS
  Filled 2018-05-09: qty 2

## 2018-05-09 MED ORDER — ACETAMINOPHEN 325 MG PO TABS
650.00 mg | ORAL_TABLET | Freq: Once | ORAL | Status: AC
Start: 2018-05-09 — End: 2018-05-09
  Administered 2018-05-09: 650 mg via ORAL
  Filled 2018-05-09: qty 2

## 2018-05-09 MED ORDER — ATORVASTATIN CALCIUM 20 MG PO TABS
20.0000 mg | ORAL_TABLET | Freq: Every day | ORAL | Status: DC
Start: 2018-05-10 — End: 2018-05-10
  Administered 2018-05-10: 20 mg via ORAL
  Filled 2018-05-09: qty 1

## 2018-05-09 MED ORDER — SUCRALFATE 1 GM/10ML PO SUSP
1.0000 g | Freq: Four times a day (QID) | ORAL | Status: DC
Start: 2018-05-09 — End: 2018-05-10
  Administered 2018-05-09 – 2018-05-10 (×3): 1 g via ORAL
  Filled 2018-05-09 (×3): qty 10

## 2018-05-09 NOTE — Progress Notes (Signed)
PLEASE NOTE: I called the pharmacy and the pharmacist told me that no PA is needed for Albuterol(Proventil) Nebulizer Solution. The prescription is ready for patient to pick up. Thank you

## 2018-05-09 NOTE — ED Provider Notes (Signed)
eMERGENCY dEPARTMENT Physician Assistant NOTE    The ED nursing record was reviewed.   The prior medical records as available electronically through Epic were reviewed.    This patient was seen with Emergency Department attending physician Dr. Junious Silk    CHIEF COMPLAINT    Patient presents with:  Chest Pain: EXPECTS LIST  Breathing Problem      HPI    Samantha Cameron is a 62 year old female with history of hypertension, diabetes, hyperlipidemia and a family history of CAD who presents to the emerge department for evaluation of chest pain developing on waking this morning.  She initially felt it was secondary to her COPD so used her nebulizers without much improvement.  She then thought maybe it was related to anxiety so took some of her anxiety medications also without improvement.    She also indicates that she has an allergy to ibuprofen as this caused gastritis in the past but did not realize naproxen was the same class of medications and has been using this recently.    She describes pain in the area of her epigastrium with occasional burning pain in her chest.    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)  ([place])  09/14/2015: Cough  No date: Depression      Comment:  per steward records  No date: Diabetes  No date: Disorders of lipoid metabolism  No date: Esophageal reflux  No date: External hemorrhoid      Comment:  per steward records  No date: HTN (hypertension)  02/01/2012: Hypoxia  02/16/2017: Left ear pain  06/07/2015: Left genital labial abscess  06/16/2015: Obesity, Class III, 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y) ([place])  08/16/2015: Osteopenia      Comment:  On xray of left knee per steward records 06/2014 Unclear                if addressed  09/14/2015: Right ear impacted cerumen  08/15/2015: Type 2 diabetes mellitus without complication, without   long-term current use of insulin (HCC)      Comment:   HEMOGLOBIN A1C (%) Date Value 07/15/2015 6.2 (H)                ----------  [facility] record - a1c 6.2 on 01/31/15 6.4 on                09/30/14 5.9 on  12/29/13 6.4 08/18/13     PROBLEM LIST  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) ([place])     Tobacco use disorder     Spinal stenosis of lumbar region     HLD (hyperlipidemia)     Chronic bilateral low back pain with bilateral sciatica     Skin change     Pineal gland cyst     History of vitamin D deficiency     Hemorrhoids     Venous (peripheral) insufficiency     Adrenal adenoma     Type 2 diabetes mellitus with microalbuminuria, without long-term current use of insulin (HCC)     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 (HCC)     Acute pain of right wrist     Abnormal PFTs     Screening for malignant neoplasm of breast     Skin irritation     H/O bone density study     Vertigo     Orthostatic hypotension     Social problem     Right arm weakness     Insomnia     Acute pain of left knee     Hx of total knee replacement, left     Acute bacterial sinusitis     Seasonal allergic rhinitis     Left foot pain     Stress due to family tension     Gastritis without bleeding     Leucocytosis     Viral URI with  cough      SURGICAL HISTORY    Past Surgical History:  No date: ANES NERVE MUSC TENDON FASCIA&BURSA KNEE&/POPLT  No date: FOOT SURGERY      Comment:  bilateral  No date: LAPAROSCOPY SURG CHOLECYSTECTOMY  No date: OB ANTEPARTUM CARE CESAREAN DLVR & POSTPARTUM      Comment:  x3  No date: TONSILLECTOMY & ADENOIDECTOMY <AGE 13  04/13/2009: TOTAL KNEE REPLACEMENT      Comment:  left  08/11/2009: WRIST GANGLION EXCISION      Comment:  left     CURRENT MEDICATIONS      Current Facility-Administered Medications:   .  sucralfate (CARAFATE) 1 GM/10ML suspension 1 g, 1 g, Oral, 4x Daily AC & HS, Desere Gwin L Squire [place], Stopped at 25/95/63 2200    Current Outpatient Medications:   Marland Kitchen  VOLTAREN 1 % GEL gel, apply 4 grams to affected area twice a day DO NOT EXCEED 32 G IN 1 DAY, Disp: 100 g, Rfl: 0  .  hydrOXYzine (ATARAX) 10 MG tablet, take 1-2 tablets by mouth once daily if needed for anxiety, Disp: 15 tablet, Rfl: 0  .  albuterol (PROVENTIL) (2.5 MG/3ML) 0.083% nebulizer solution, Take 3 mLs by nebulization every 6 (six) hours as needed for Wheezing, Disp: 60 mL, Rfl: 1  .  metoprolol (TOPROL-XL) 25 MG 24 hr tablet, Take 1 tablet by mouth daily, Disp: 90 tablet, Rfl: 1  .  Omeprazole 20 MG TBEC, Take 1 tablet by mouth daily Take 30 minutes before breakfast, Disp: 30 tablet, Rfl: 1  .  naproxen (NAPROSYN) 500 MG tablet, Take 1 tablet by mouth 2 (two) times daily with meals, Disp: 60 tablet, Rfl: 0  .  lisinopril (PRINIVIL,ZESTRIL) 20 MG tablet, take 1 tablet by mouth once daily, Disp: 90 tablet, Rfl: 3  .  ONETOUCH DELICA LANCETS FINE MISC, TEST once daily, Disp: 100 each, Rfl: 11  .  metFORMIN (GLUCOPHAGE-XR)  500 MG 24 hr tablet, take 4 tablets by mouth once daily WITH BREAKFAST, Disp: 360 tablet, Rfl: 3  .  atorvastatin (LIPITOR) 20 MG tablet, take 1 tablet by mouth once daily, Disp: 90 tablet, Rfl: 3  .  ONETOUCH VERIO test strip, TEST once daily, Disp: 100 strip, Rfl: 11  .  albuterol (PROVENTIL HFA,VENTOLIN HFA, PROAIR HFA) 108  (90 Base) MCG/ACT inhaler, Inhale 2 puffs into the lungs every 6 (six) hours as needed for Wheezing or Shortness of breath, Disp: 1 Inhaler, Rfl: 11  .  Omega-3 Fatty Acids (FISH OIL) 1000 MG CAPS, Take by mouth, Disp: , Rfl:   .  cholecalciferol (VITAMIN D3) 1000 UNIT tablet, Take 1 tablet by mouth daily, Disp: , Rfl:   .  pregabalin (LYRICA) 100 MG capsule, Take 100 mg by mouth 2 (two) times daily With Dr Dwyane Dee, Disp: , Rfl:   .  oxycodone-acetaminophen (PERCOCET) 5-325 MG per tablet, Take 1 tablet by mouth 2 (two) times daily as needed With Dr Dwyane Dee, Disp: , Rfl:   .  aspirin 81 MG tablet, Take 81 mg by mouth daily., Disp: , Rfl:     ALLERGIES    Review of Patient's Allergies indicates:   Codeine camsylate       Shortness of Breath, Other (See                            Comments)    Comment:Chest pain   Motrin [ibuprofen]      Nausea Only   Sulfa antibiotics       Other (See Comments)   Bactrim                 Rash, Itching    FAMILY HISTORY    Review of patient's family history indicates:  Problem: Diabetes      Relation: Mother          Age of Onset: (Not Specified)  Problem: Heart      Relation: Brother          Age of Onset: (Not Specified)          Comment: CAD and AAA  Problem: Hypertension      Relation: Mother          Age of Onset: (Not Specified)  Problem: Arthritis      Relation: Mother          Age of Onset: (Not Specified)  Problem: Stroke      Relation: Mother          Age of Onset: (Not Specified)  Problem: Diabetes      Relation: Brother          Age of Onset: (Not Specified)          Comment: same brother  Problem: Cancer - Other      Relation: Father          Age of Onset: (Not Specified)          Comment: type uncertain  Problem: Thyroid      Relation: Sister          Age of Onset: (Not Specified)  Problem: Alcohol/Drug Abuse      Relation: Son          Age of Onset: (Not Specified)  Problem: OTHER      Relation: Son          Age of Onset: (Not Specified)  Comment: ?brain aneurysm per  [facility] records  Problem: No Known Problems      Relation: Grandchild          Age of Onset: (Not Specified)          Comment: 6  Problem: Cancer - Breast      Relation: FamHxNeg          Age of Onset: (Not Specified)  Problem: Cancer - Cervical      Relation: FamHxNeg          Age of Onset: (Not Specified)  Problem: Cancer - Ovarian      Relation: FamHxNeg          Age of Onset: (Not Specified)  Problem: Cancer - Colon      Relation: FamHxNeg          Age of Onset: (Not Specified)  Problem: Psychiatric Illness      Relation: FamHxNeg          Age of Onset: (Not Specified)      SOCIAL HISTORY    Social History     Socioeconomic History   . Marital status: Divorced     Spouse name: Not on file   . Number of children: Not on file   . Years of education: Not on file   . Highest education level: Not on file   Occupational History   . Not on file   Social Needs   . Financial resource strain: Not on file   . Food insecurity:     Worry: Not on file     Inability: Not on file   . Transportation needs:     Medical: Not on file     Non-medical: Not on file   Tobacco Use   . Smoking status: Current Every Day Smoker     Packs/day: 0.50     Years: 31.00     Pack years: 15.50   . Smokeless tobacco: Never Used   . Tobacco comment: quit smoking inform provided to patient   Substance and Sexual Activity   . Alcohol use: No   . Drug use: No   . Sexual activity: Not Currently     Partners: Male     Birth control/protection: Tubal Ligation   Lifestyle   . Physical activity:     Days per week: Not on file     Minutes per session: Not on file   . Stress: Not on file   Relationships   . Social connections:     Talks on phone: Not on file     Gets together: Not on file     Attends religious service: Not on file     Active member of club or organization: Not on file     Attends meetings of clubs or organizations: Not on file     Relationship status: Not on file   . Intimate partner violence:     Fear of current or ex partner: Not on file      Emotionally abused: Not on file     Physically abused: Not on file     Forced sexual activity: Not on file   Other Topics Concern   . 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 Simons,MD, 07/15/2015, 2:56 PM            REVIEW OF SYSTEMS    The pertinent positives are reviewed in the HPI above. All other systems were reviewed and are negative.    PHYSICAL EXAM      BP 123/58  Pulse 87  Temp 98.2 F (Oral)  Resp 18  Wt 104.3 kg (230 lb)  LMP 10/22/2007  SpO2 95%  BMI 43.71 kg/m2  GENERAL:  Well-appearing, no distress.  SKIN:  Warm & Dry, no rash, no bruising.  HEAD:  Atraumatic.   EYES: PERRL. EOMI. Anicteric sclera.   ENT: Oropharynx clear.  NECK:  Supple with full painless ROM at the neck  LUNGS:  Clear to auscultation bilaterally without rales, rhonchi or wheezing.   HEART:  Regular rate and rhythm.  No murmurs, rubs, or gallops.   ABDOMEN:  Soft, flat, without distension.  Nontender to palpation.   MUSCULOSKELETAL:  No deformities. Well-perfused extremities with  2+ DP/PT/Rad pulses bilaterally. No cyanosis or edema.        RESULTS  Results for orders placed or performed during the hospital encounter of 05/09/18 (from the past 24 hour(s))   Comprehensive Metabolic Panel    Collection Time: 05/09/18  6:31 PM   Result Value    SODIUM 143    POTASSIUM 5.0    CHLORIDE 107    CARBON DIOXIDE 25    ANION GAP 11    CALCIUM 9.0    Glucose Random 110    BUN (UREA NITROGEN) 15    TOTAL PROTEIN 6.9    ALBUMIN 3.8    BILIRUBIN TOTAL 0.3    ALKALINE PHOSPHATASE 88    ASPARTATE AMINOTRANSFERASE 28    CREATININE 0.6    ESTIMATED GLOMERULAR FILT RATE > 60    ALANINE AMINOTRANSFERASE 30    Narrative     Tests added: LIPASE by Peytyn Trine L. PA-C on 05/09/18 at  1924 by [email or web address] SLIGHTLY HEMOLYZED!   Magnesium    Collection Time: 05/09/18  6:31 PM   Result Value    MAGNESIUM 1.6 (L)    Narrative    Tests added: LIPASE by [employer] L. PA-C on 05/09/18 at  1924 by [email or web address] SLIGHTLY HEMOLYZED!   CBC, Platelet & Differential    Collection Time: 05/09/18  6:31 PM   Result Value    WHITE BLOOD CELL COUNT 11.8 (H)    RED BLOOD CELL COUNT 4.18    HEMOGLOBIN 12.3    HEMATOCRIT 38.4    MEAN CORPUSCULAR VOL 91.9    MEAN CORPUSCULAR HGB 29.4    MEAN CORP HGB CONC 32.0    RBC DISTRIBUTION WIDTH STD DEV 47.9 (H)    RBC DISTRIBUTION WIDTH 14.7 (H)    PLATELET COUNT 325    MEAN PLATELET VOLUME 11.0    NEUTROPHIL % 61.5    IMMATURE GRANULOCYTE % 0.3    LYMPHOCYTE % 21.7    MONOCYTE % 7.6    EOSINOPHIL % 8.6 (H)    BASOPHIL % 0.3    ABSOLUTE NEUTROPHIL COUNT 7.3    ABSOLUTE IMM GRAN COUNT 0.03    ABSOLUTE LYMPH COUNT 2.6    ABSOLUTE MONO COUNT 0.9    ABSOLUTE EOSINOPHIL COUNT 1.0 (H)  ABSOLUTE BASO COUNT 0.0   Troponin I    Collection Time: 05/09/18  6:31 PM   Result Value    TROPONIN I < 0.02   NT-proBNP    Collection Time: 05/09/18  6:31 PM   Result Value    NT-proBNP 114    Narrative    Tests added: LIPASE by Vanessa Alesi L. PA-C on 05/09/18 at  1924 by [email or web address] SLIGHTLY HEMOLYZED!   Lipase    Collection Time: 05/09/18  6:31 PM   Result Value    LIPASE 185    Narrative    Tests added: LIPASE by [employer] L. PA-C on 05/09/18 at  1924 by [email or web address] SLIGHTLY HEMOLYZED!        RADIOLOGY  CXR:  NAD    EKG:  EKG reviewed with Dr. Junious Silk  EKG reveals a normal sinus rhythm with rate of 98. Normal axis, normal intervals, no heart strain and not acute ST or T wave changes indicative of ACS.     Evidence of inferior infarct unchanged from prior EKG from Mar 25, 2018 which revealed a sinus tachycardia with rate of 106    MEDICATIONS ADMINISTERED ON THIS VISIT  Orders Placed This Encounter      sucralfate  (CARAFATE) 1 GM/10ML suspension 1 g      magnesium sulfate infusion 1 g/100 mL premixed      ondansetron (ZOFRAN) injection 4 mg      acetaminophen (TYLENOL) tablet 650 mg      ED COURSE & MEDICAL DECISION MAKING      I reviewed the patient's past medical history/problem list, past surgical history, medication list, social history and allergies. Pt remained hemodynamically stable during their stay in the emergency department.       ED Decision Making & Course:   This is a 62 year old female with history of diabetes, hypertension, hyperlipidemia and a family history of CAD presenting for evaluation of chest pain beginning this morning.  She initially used some nebs followed by some anxiety medications but had persistent pain.  She was then seen in urgent care and was given a GI cocktail/full dose aspirin but was sent in considering her risk factors.  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    Labs obtained with negative initial troponin.  She was given Tylenol, Carafate and Zofran here with improvement of her symptoms.    Patient has a heart score of 5 making.  She will be admitted for formal ACS rule out.  Details for admission outlined below.    ADMIT   Team: [facility] Team  Attending Physician: Dr. Nilda Simmer Off Discussion: Dr. Genene Churn  Observation  Tele     Condition: Stable  Disposition: Admit    Diagnosis:   Chest pain  Epigastric pain    [employer], PA-C

## 2018-05-09 NOTE — ED Notes (Signed)
XR 19:11

## 2018-05-09 NOTE — Telephone Encounter (Signed)
Regarding: sob, acid reflux, anxiety   ----- Message from Sophronia Simas sent at 05/09/2018  3:48 PM EDT -----  Samantha Cameron [id number], 62 year old, female    Calls today:  Sick    What are the symptoms shortness of breath, acid reflux. Was prescribed naproxen by dr Lisa Roca and did a nebulizer treatment during last visit. has also taken tums, gas x to help with acid reflux. Patient reports she is also having really bad anxiety and stomach pain.  How long has patient been sick?   What has pt. tried at home   Person calling on behalf of patient: Patient (self)    CALL BACK NUMBER: [phone]  Best time to call back:   Cell phone:   Other phone:    Patient's language of care: English    Patient does not need an interpreter.    Patient's PCP: Suann Larry. Simons,MD

## 2018-05-09 NOTE — H&P (Signed)
[place]   ADMISSION H&P NOTE     Chief Complaint:   Patient presents with:  Chest Pain: CHEST PAIN    History of Present Illness:  61 yo woman with diabetes, hypertension, family history of CAD, has had one day of chest pain described as heaviness and a sensation of heat rising up from the abdomen into the chest.     She tried using breathing treatments for COPD at home but to no effect.  She sensed that she might be having heartburn but it persisted longer than usual at home. She also became nauseous and vomited.     Troponin negative and EKG no ischemic changes. Received aspirin at outside hospital. Got GI cocktail with symptomatic relief in our ED. On atorvastatin at home. HEART score of 4, ED requested admission to rule out ACS.    Review of Systems:  Systems reviewed.  General no fatigue, - fevers,  Eyes no blurry vision or diplopia,   ENT no rhinorrhea or epistaxis,   CV no palpitations; no Chest pain  Resp no dyspnea, no cough,   GI as per HPI  GU no hematuria or dysuria,   Muscular no joint pain, no muscle pain, no bone pain,   Skin no rash no jaundice,   Neuro no loss of strength or sensation,   Psych no abnormal thought or mood,   Heme/Lymph no easy bleeding or bruising.    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) ([place])     Tobacco use disorder     Spinal stenosis of lumbar region     HLD (hyperlipidemia)     Chronic bilateral low back pain with bilateral sciatica     Skin change     Pineal gland cyst     History of vitamin D deficiency     Hemorrhoids     Venous (peripheral) insufficiency     Adrenal adenoma     Type 2 diabetes mellitus with microalbuminuria, without long-term current use of insulin ([place])     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 (HCC)     Acute pain of right wrist     Abnormal PFTs     Screening for malignant neoplasm of breast     Skin irritation     H/O  bone density study     Vertigo     Orthostatic hypotension     Social problem     Right arm weakness     Insomnia     Acute pain of left knee     Hx of total knee replacement, left     Acute bacterial sinusitis     Seasonal allergic rhinitis     Left foot pain     Stress due to family tension     Gastritis without bleeding     Leucocytosis     Viral URI with cough      Past Medical History:   Past Medical History:  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) ([place])  09/14/2015: Cough  No date: Depression      Comment:  per steward records  No date: Diabetes  No date: Disorders of lipoid metabolism  No date: Esophageal reflux  No date: External hemorrhoid      Comment:  per steward records  No date: HTN (hypertension)  02/01/2012: Hypoxia  02/16/2017: Left ear pain  06/07/2015: Left genital labial abscess  06/16/2015: Obesity, Class III, 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y) ([place])  08/16/2015: Osteopenia      Comment:  On xray of left knee per steward records 06/2014 Unclear                if addressed  09/14/2015: Right ear impacted cerumen  08/15/2015: Type 2 diabetes mellitus without complication, without   long-term current use of insulin (HCC)      Comment:   HEMOGLOBIN A1C (%) Date Value 07/15/2015 6.2 (H)                ----------  Steward record - a1c 6.2 on 01/31/15 6.4 on                09/30/14 5.9 on 12/29/13 6.4  08/18/13     Past Surgical History:   Past Surgical History:  No date: [place] FASCIA&BURSA KNEE&/POPLT  No date: FOOT SURGERY      Comment:  bilateral  No date: LAPAROSCOPY SURG CHOLECYSTECTOMY  No date: OB ANTEPARTUM CARE CESAREAN DLVR & POSTPARTUM      Comment:  x3  No date: TONSILLECTOMY & ADENOIDECTOMY <AGE 2  04/13/2009: TOTAL KNEE REPLACEMENT      Comment:  left  08/11/2009: WRIST GANGLION EXCISION      Comment:  left     Medications prior to Admission:     No current facility-administered medications on file prior to encounter.   Current Outpatient Medications on File Prior to Encounter:  VOLTAREN 1 % GEL gel apply 4 grams to affected area twice a day DO NOT EXCEED 32 G IN 1 DAY Disp: 100 g Rfl: 0   hydrOXYzine (ATARAX) 10 MG tablet take 1-2 tablets by mouth once daily if needed for anxiety Disp: 15 tablet Rfl: 0   albuterol (PROVENTIL) (2.5 MG/3ML) 0.083% nebulizer solution Take 3 mLs by nebulization every 6 (six) hours as needed for Wheezing Disp: 60 mL Rfl: 1   metoprolol (TOPROL-XL) 25 MG 24 hr tablet Take 1 tablet by mouth daily Disp: 90 tablet Rfl: 1   Omeprazole 20 MG TBEC Take 1 tablet by mouth daily Take 30 minutes before breakfast Disp: 30 tablet Rfl: 1   naproxen (NAPROSYN) 500 MG tablet Take 1 tablet by mouth 2 (two) times daily with meals Disp: 60 tablet Rfl: 0   lisinopril (PRINIVIL,ZESTRIL) 20 MG tablet take 1 tablet by mouth once daily Disp: 90 tablet Rfl: 3   ONETOUCH DELICA LANCETS FINE MISC TEST once daily Disp: 100 each Rfl: 11   metFORMIN (GLUCOPHAGE-XR) 500 MG 24 hr tablet take 4 tablets by mouth once  daily WITH BREAKFAST Disp: 360 tablet Rfl: 3   atorvastatin (LIPITOR) 20 MG tablet take 1 tablet by mouth once daily Disp: 90 tablet Rfl: 3   ONETOUCH VERIO test strip TEST once daily Disp: 100 strip Rfl: 11   albuterol (PROVENTIL HFA,VENTOLIN HFA, PROAIR HFA) 108 (90 Base) MCG/ACT inhaler Inhale 2 puffs into the lungs every 6 (six) hours as needed for Wheezing or Shortness  of breath Disp: 1 Inhaler Rfl: 11   Omega-3 Fatty Acids (FISH OIL) 1000 MG CAPS Take by mouth Disp:  Rfl:    cholecalciferol (VITAMIN D3) 1000 UNIT tablet Take 1 tablet by mouth daily Disp:  Rfl:    pregabalin (LYRICA) 100 MG capsule Take 100 mg by mouth 2 (two) times daily With Dr Dwyane Dee Disp:  Rfl:    oxycodone-acetaminophen (PERCOCET) 5-325 MG per tablet Take 1 tablet by mouth 2 (two) times daily as needed With Dr Dwyane Dee Disp:  Rfl:    aspirin 81 MG tablet Take 81 mg by mouth daily. Disp:  Rfl:        Allergies:   Review of Patient's Allergies indicates:   Codeine camsylate       Shortness of Breath, Other (See                            Comments)    Comment:Chest pain   Motrin [ibuprofen]      Nausea Only   Sulfa antibiotics       Other (See Comments)   Bactrim                 Rash, Itching    Social History:  Tobacco Use: Social History    Tobacco Use      Smoking status: Current Every Day Smoker        Packs/day: 0.50        Years: 31.00        Pack years: 15.5      Smokeless tobacco: Never Used      Tobacco comment: quit smoking inform provided to patient    Alcohol:   Alcohol use No       Family History: Review of patient's family history indicates:  Problem: Diabetes      Relation: Mother          Age of Onset: (Not Specified)  Problem: Heart      Relation: Brother          Age of Onset: (Not Specified)          Comment: CAD and AAA  Problem: Hypertension      Relation: Mother          Age of Onset: (Not Specified)  Problem: Arthritis      Relation: Mother          Age of Onset: (Not Specified)  Problem: Stroke      Relation: Mother          Age of Onset: (Not Specified)  Problem: Diabetes      Relation: Brother          Age of Onset: (Not Specified)          Comment: same brother  Problem: Cancer - Other      Relation: Father          Age of Onset: (Not Specified)          Comment: type uncertain  Problem: Thyroid      Relation:  Sister          Age of Onset: (Not Specified)  Problem: Alcohol/Drug Abuse       Relation: Son          Age of Onset: (Not Specified)  Problem: OTHER      Relation: Son          Age of Onset: (Not Specified)          Comment: ?brain aneurysm per steward records  Problem: No Known Problems      Relation: Grandchild          Age of Onset: (Not Specified)          Comment: 6  Problem: Cancer - Breast      Relation: FamHxNeg          Age of Onset: (Not Specified)  Problem: Cancer - Cervical      Relation: FamHxNeg          Age of Onset: (Not Specified)  Problem: Cancer - Ovarian      Relation: FamHxNeg          Age of Onset: (Not Specified)  Problem: Cancer - Colon      Relation: FamHxNeg          Age of Onset: (Not Specified)  Problem: Psychiatric Illness      Relation: FamHxNeg          Age of Onset: (Not Specified)      Physical Exam:  BP 153/84  Pulse 91  Temp 98 F (36.7 C)  Resp 18  Wt 104.3 kg (230 lb)  LMP 10/22/2007  SpO2 96%  BMI 43.71 kg/m2    General Appearance:  Obese middle aged woman laying comfortably in bed, NAD AAOx3.    Head:  Normocephalic, without obvious abnormality, atraumatic   Eyes:  PERRL, conjunctiva/corneas clear, EOM's intact, fundi benign, both eyes   Ears:  Normal TM's and external ear canals, both ears   Nose: Nares normal, septum midline,mucosa normal, no drainage or sinus tenderness   Throat: Lips, mucosa, and tongue normal; teeth and gums normal   Neck: Supple, symmetrical, trachea midline, noJVD   Back:   Symmetric, no curvature, ROM normal, no CVA tenderness   Lungs:   Clear to auscultation bilaterally, respirations unlabored   Heart:  Regular rate and rhythm, S1 and S2 normal, no murmur, rub, or gallop   Abdomen:   Soft, non-tender, bowel sounds active all four quadrants,  no masses, no organomegaly   Extremities: Extremities normal, atraumatic, no cyanosis or edema   Pulses: 2+ and symmetric   Skin: Skin color, texture, turgor normal, no rashes or lesions   Lymph nodes: Cervical, supraclavicular, and axillary nodes normal   Neurologic: Normal        Intake/Output last 24hours (7a-7a):   No intake/output data recorded.    Vital Signs - Last 24 Hours:   BP: (137-153)/(57-90)   Temp:  [98 F (36.7 C)]   Pulse:  [91-100]   Resp:  [18-20]   SpO2:  [95 %-96 %]     Vital Signs - Last 8 Hours:   BP: (137-153)/(57-90)   Temp:  [98 F (36.7 C)]   Pulse:  [91-100]   Resp:  [18-20]   SpO2:  [95 %-96 %]     Recent Labs:  CBC:   Lab Results   Component Value Date    WBC 11.8 (H) 05/09/2018    HGB 12.3 05/09/2018    HCT 38.4 05/09/2018  PLTA 325 05/09/2018    RBC 4.18 05/09/2018     BMP: Lab Results   Component Value Date    NA 143 05/09/2018    K 5.0 05/09/2018    CL 107 05/09/2018    CO2 25 05/09/2018    BUN 15 05/09/2018    CREAT 0.6 05/09/2018    GLUCOSER 110 05/09/2018    CA 9.0 05/09/2018     LFTs:   Lab Results   Component Value Date    AST 28 05/09/2018    ALT 30 05/09/2018    TBILI 0.3 05/09/2018    ALKPHOS 88 05/09/2018       Lab Results   Component Value Date    PT 11.0 04/18/2017    INR 1.0 04/18/2017    APTT 30.5 04/18/2017     Lab Results   Component Value Date    CKMBI 1.6 02/08/2009    TROPI < 0.02 05/09/2018     No results found for: BNP    Imaging:   CXR clear on my read    Assessment/Plan:  62 yo woman with diabetes, hypertension, family history of CAD, has had one day of chest pain described as heaviness, admitted for rule out ACS.    1. Rule out ACS -   Trend troponin  Continue aspirin/statin    2. Diabetes - Hold metformin while admitted. Check blood sugar qAC and determine if there is a need for insulin sliding scale.    3. Hypertension - continue home lisinopril.    4. Heartburn/indigestion - if she has persistent symptoms, she may benefit from outpatient endoscopy    DVT prophylaxis Heparin 5000 units subcutaneous q12h  FULL CODE (presumed)    Loralee Pacas, MD  Hospitalist

## 2018-05-09 NOTE — Telephone Encounter (Signed)
[phone]  Woke up with anxiety before 2 pm, the medicines didn't help    Having heartburn attacks, acid reflux, Dr Lisa Roca gave her nebulizer treatments, coughed up brown chunky phlegm and omeprazole last visit.  Taking Gas x  Naproxen taking with food twice a day was taking for the chest pain  Gave her anxiety medication for panicky attack, today took a hydroxyzine 10 mg today, she only has 2 left   needs a refill of this  Feels anxious, last took anxiety med 2 pm  Did neb at 2 pm  Breathing now is okay  Having stomach ache front center left to right on the top, is very gassy,   Feels nauseous , no vomiting, no sob,  Lives with 45 year old granddaughter but cant come now  Wants the anxiety to go away. Tried all the things Dr Lisa Roca told her to do, nothing is helping. She states she feels very uncomfortable from her belly ache and making her anxiety worse.    No SDA available Saturday tomorrow  She states she needs refill for hydroxyzine has 2 tablets  What do you advise? She states she couldn't come in now anyway?

## 2018-05-09 NOTE — Narrator Note (Signed)
Patient transported to [facility], accompanied by tech.

## 2018-05-09 NOTE — ED Triage Note (Signed)
Pt self presents, sent in from UC for evaluation of chest pain and SOB. Pt states she awoke this morning with CP and  SOB( + cough) , did a neb treatment and felt less SOB but chest pain continued. Pt denies diaphoresis, + dizzy, + N/V .   Pt also reports pain which radiates across her lower abdomin. No urinary symptoms.

## 2018-05-09 NOTE — Telephone Encounter (Signed)
Spoke to Dr Francisca December patient told her she can come in tomorrow at 11:40 am  She said OKay    She said her stomach pains are getting a little worse and might go to the hospital. Advised going to [facility] urgent [facility] [place], she agreed and will see Dr [name] tomorrow 05/10/18 at 11:40 am  Samantha Fitting, RN, 05/09/2018

## 2018-05-09 NOTE — Narrator Note (Signed)
Report called to [facility] on [place] [address].

## 2018-05-09 NOTE — Narrator Note (Addendum)
Patient reports mild chest pain only with taking a deep breath. Reports 7/10 mid abd pain at this time, medications administered as ordered.

## 2018-05-09 NOTE — ED Provider Notes (Signed)
Patient seen and evaluated with the PA/resident. Please see their ED Provider Note for additional details.    Subjective:   Hx HTN, DM, HL, and family hx of CAD, here with chest pain and shortness of breath. Not much improvement with neb treatment or ativan.     Objective:  Overall well appearing, in no distress. Lungs are clear. Epigastrium is mildly tender.     Assessment/Plan:  Patient with pain in the chest that may be related to NSAID use or gastritis, though is concerning that is not responding to usual treatments. Given risk factors and family history, will admit for further cardiac testing.    Olam Idler, MD  Attending Physician  [facility] Department of Emergency Medicine

## 2018-05-09 NOTE — ED Notes (Signed)
Bed: 14   Expected date: 05/09/18  Expected time:   Means of arrival:   Comments:  Samantha Cameron, Samantha Cameron  12/03/1955  62 yo F, hx DM, HL, FHx CAD, with chest pain today. EKG appears okay. VS okay. Got ASA and GI cocktail in urgent care. Sent to ED for concern for risk factors.

## 2018-05-10 ENCOUNTER — Telehealth (HOSPITAL_BASED_OUTPATIENT_CLINIC_OR_DEPARTMENT_OTHER): Payer: Self-pay | Admitting: Internal Medicine

## 2018-05-10 ENCOUNTER — Encounter (HOSPITAL_BASED_OUTPATIENT_CLINIC_OR_DEPARTMENT_OTHER): Payer: Self-pay

## 2018-05-10 DIAGNOSIS — K219 Gastro-esophageal reflux disease without esophagitis: Secondary | ICD-10-CM

## 2018-05-10 HISTORY — DX: Gastro-esophageal reflux disease without esophagitis: K21.9

## 2018-05-10 LAB — CBC WITH PLATELET
HEMATOCRIT: 39.7 % (ref 34.1–44.9)
HEMOGLOBIN: 12.5 g/dL (ref 11.2–15.7)
MEAN CORP HGB CONC: 31.5 g/dL (ref 31.0–37.0)
MEAN CORPUSCULAR HGB: 29.3 pg (ref 26.0–34.0)
MEAN CORPUSCULAR VOL: 93 fL (ref 80.0–100.0)
MEAN PLATELET VOLUME: 10.9 fL (ref 8.7–12.5)
PLATELET COUNT: 254 10*3/uL (ref 150–400)
RBC DISTRIBUTION WIDTH STD DEV: 47.7 fL — ABNORMAL HIGH (ref 35.1–46.3)
RBC DISTRIBUTION WIDTH: 14.7 % — ABNORMAL HIGH (ref 11.5–14.3)
RED BLOOD CELL COUNT: 4.27 M/uL (ref 3.90–5.20)
WHITE BLOOD CELL COUNT: 9.6 10*3/uL (ref 4.0–11.0)

## 2018-05-10 LAB — TROPONIN I
TROPONIN I: <0.02 ng/mL (ref 0.00–0.04)
TROPONIN I: <0.02 ng/mL (ref 0.00–0.04)

## 2018-05-10 LAB — BLOOD SUGAR FINGERSTICK (POINT OF CARE)
FINGERSTICK GLUCOSE: 118 mg/dl (ref 74–160)
FINGERSTICK GLUCOSE: 97 mg/dl (ref 74–160)

## 2018-05-10 LAB — BASIC METABOLIC PANEL
ANION GAP: 9 mmol/L (ref 5–15)
BUN (UREA NITROGEN): 14 mg/dL (ref 7–18)
CALCIUM: 9 mg/dL (ref 8.5–10.1)
CARBON DIOXIDE: 28 mmol/L (ref 21–32)
CHLORIDE: 106 mmol/L (ref 98–107)
CREATININE: 0.6 mg/dL (ref 0.4–1.2)
ESTIMATED GLOMERULAR FILT RATE: >60 mL/min (ref >60)
Glucose Random: 91 mg/dL (ref 74–160)
POTASSIUM: 4.3 mmol/L (ref 3.5–5.1)
SODIUM: 143 mmol/L (ref 136–145)

## 2018-05-10 LAB — XR CHEST 2 VIEWS

## 2018-05-10 MED ORDER — SUCRALFATE 1 GM/10ML PO SUSP: 1 g | Bottle | Freq: Three times a day (TID) | ORAL | 0 refills | 0 days | Status: DC | PRN

## 2018-05-10 MED ORDER — BENZONATATE 100 MG PO CAPS
100.0000 mg | ORAL_CAPSULE | Freq: Three times a day (TID) | ORAL | Status: DC | PRN
Start: 2018-05-10 — End: 2018-05-10
  Administered 2018-05-10: 100 mg via ORAL
  Filled 2018-05-10: qty 1

## 2018-05-10 MED ORDER — OMEPRAZOLE 20 MG PO TBEC
1.0000 | DELAYED_RELEASE_TABLET | Freq: Two times a day (BID) | ORAL | 1 refills | Status: DC
Start: 2018-05-10 — End: 2018-05-19

## 2018-05-10 MED ORDER — SUCRALFATE 1 G PO TABS
1.0000 g | ORAL_TABLET | Freq: Three times a day (TID) | ORAL | 0 refills | Status: DC
Start: 2018-05-10 — End: 2018-05-14

## 2018-05-10 MED ORDER — SUCRALFATE 1 GM/10ML PO SUSP
1.0000 g | Freq: Three times a day (TID) | ORAL | 0 refills | Status: DC | PRN
Start: 2018-05-10 — End: 2018-05-10

## 2018-05-10 MED ORDER — OMEPRAZOLE 20 MG PO TBEC: 1 | tablet | Freq: Two times a day (BID) | ORAL | 1 refills | 0 days | Status: DC

## 2018-05-10 MED ORDER — SUCRALFATE 1 G PO TABS: 1 g | tablet | Freq: Three times a day (TID) | ORAL | 0 refills | 0 days | Status: DC

## 2018-05-10 MED ORDER — SIMETHICONE 80 MG PO CHEW
80.0000 mg | CHEWABLE_TABLET | Freq: Four times a day (QID) | ORAL | Status: DC | PRN
Start: 2018-05-10 — End: 2018-05-10

## 2018-05-10 NOTE — Progress Notes (Addendum)
Pt admitted with c/p, r/o ACS. Pt is independent with all needs, lives with grd-dtr in [place]. She is the guardian of her 64 yo grand-dtr whi os being cared for at present by pt's adult son. Anticipate d/c home with outpt followup. MOON given, explained, signed.

## 2018-05-10 NOTE — Progress Notes (Signed)
Paged by patient  Liquid sucralfate not covered by insurance  Asking for change to pills  rx sent

## 2018-05-10 NOTE — Discharge Summary (Signed)
Physician Discharge Summary     Patient ID:  Samantha Cameron  [id number]  62 year old  03-08-1956    Admit date: 05/09/18      Discharge date and time: 05/10/18    Admitting Physician: Loralee Pacas     Discharge Physician: Phill Mutter, MD    Admission Diagnoses: No admission diagnoses are documented for this encounter.    Discharge Diagnoses: uncontrolled gerd    Admission Condition: fair    Discharged Condition: good    Indication for Admission: 62 yo woman with diabetes, hypertension, family history of CAD, has had one day of chest pain described as heaviness and a sensation of heat rising up from the abdomen into the chest.     She tried using breathing treatments for COPD at home but to no effect.  She sensed that she might be having heartburn but it persisted longer than usual at home. She also became nauseous and vomited.     Troponin negative and EKG no ischemic changes. Received aspirin at outside hospital. Got GI cocktail with symptomatic relief in our ED. On atorvastatin at home. HEART score of 4, ED requested admission to rule out ACS.        Hospital Course: 61 yo woman with diabetes, hypertension, family history of CAD, has had one day of chest pain described as heaviness, admitted for rule out ACS.    1. Cp due to uncontrolled gerd, poa  - increase ppi to bid and d/c naprosyn  - acs ruled out    2. Diabetes - ok to restart home regimen on d/c    3. Hypertension - continue home lisinopril.    4. Heartburn/indigestion - if she has persistent symptoms, she may benefit from outpatient endoscopy, she is also requesting carafate prescription since this helped her the most.    I have spent 45 min and >50% of time at bedside evaluation, counseling and coordination of care.       Consults: none    Significant Diagnostic Studies: see above    Treatments: see above    Discharge Exam:  BP 121/83  Pulse 79  Temp 97.4 F (36.3 C) (Oral)  Resp 18  Wt 110.3 kg (243 lb 2.7 oz)  LMP 10/22/2007  SpO2 95%  BMI  46.21 kg/m2    General Appearance:    Alert, cooperative, no distress, appears stated age   Head:    Normocephalic, without obvious abnormality, atraumatic   Eyes:    PERRL, conjunctiva/corneas clear, EOM's intact, fundi     benign, both eyes   Ears:    Normal TM's and external ear canals, both ears   Nose:   Nares normal, septum midline, mucosa normal, no drainage    or sinus tenderness   Throat:   Lips, mucosa, and tongue normal; teeth and gums normal   Neck:   Supple, symmetrical, trachea midline, no adenopathy;     thyroid:  no enlargement/tenderness/nodules; no carotid    bruit or JVD   Back:     Symmetric, no curvature, ROM normal, no CVA tenderness   Lungs:     Clear to auscultation bilaterally, respirations unlabored   Chest Wall:    No tenderness or deformity    Heart:    Regular rate and rhythm, S1 and S2 normal, no murmur, rub   or gallop   Breast Exam:    No tenderness, masses, or nipple abnormality   Abdomen:     Soft, non-tender, bowel sounds active  all four quadrants,     no masses, no organomegaly   Genitalia:    deferred   Rectal:    deferred   Extremities:   Extremities normal, atraumatic, no cyanosis or edema   Pulses:   2+ and symmetric all extremities   Skin:   Skin color, texture, turgor normal, no rashes or lesions   Lymph nodes:   Cervical, supraclavicular, and axillary nodes normal   Neurologic:   CNII-XII intact, normal strength, sensation and reflexes     throughout       Disposition: home    Patient Instructions:   Current Discharge Medication List    START taking these medications    sucralfate (CARAFATE) 1 GM/10ML suspension  Take 10 mLs by mouth 3 (three) times daily as needed (acid reflux) for up to 14 days  Qty: 1 Bottle Refills: 0      CONTINUE these medications which have CHANGED    Omeprazole 20 MG TBEC  Take 1 tablet by mouth 2 (two) times daily Take 30 minutes before breakfast  Qty: 30 tablet Refills: 1      CONTINUE these medications which have NOT CHANGED    VOLTAREN 1 % GEL  gel  apply 4 grams to affected area twice a day DO NOT EXCEED 32 G IN 1 DAY  Qty: 100 g Refills: 0    hydrOXYzine (ATARAX) 10 MG tablet  take 1-2 tablets by mouth once daily if needed for anxiety  Qty: 15 tablet Refills: 0    albuterol (PROVENTIL) (2.5 MG/3ML) 0.083% nebulizer solution  Take 3 mLs by nebulization every 6 (six) hours as needed for Wheezing  Qty: 60 mL Refills: 1    metoprolol (TOPROL-XL) 25 MG 24 hr tablet  Take 1 tablet by mouth daily  Qty: 90 tablet Refills: 1  Associated Diagnoses:Tachycardia    lisinopril (PRINIVIL,ZESTRIL) 20 MG tablet  take 1 tablet by mouth once daily  Qty: 90 tablet Refills: 3    ONETOUCH DELICA LANCETS FINE MISC  TEST once daily  Qty: 100 each Refills: 11    metFORMIN (GLUCOPHAGE-XR) 500 MG 24 hr tablet  take 4 tablets by mouth once daily WITH BREAKFAST  Qty: 360 tablet Refills: 3  Associated Diagnoses:Type 2 diabetes mellitus with microalbuminuria, without long-term current use of insulin (HCC)    atorvastatin (LIPITOR) 20 MG tablet  take 1 tablet by mouth once daily  Qty: 90 tablet Refills: 3    ONETOUCH VERIO test strip  TEST once daily  Qty: 100 strip Refills: 11    albuterol (PROVENTIL HFA,VENTOLIN HFA, PROAIR HFA) 108 (90 Base) MCG/ACT inhaler  Inhale 2 puffs into the lungs every 6 (six) hours as needed for Wheezing or Shortness of breath  Qty: 1 Inhaler Refills: 11  Associated Diagnoses: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      STOP taking these  medications    naproxen (NAPROSYN) 500 MG tablet        Activity: activity as tolerated  Diet: cardiac diet and diabetic diet  Wound Care: none needed    Code Status during hospital stay: Full Code    [place] Future Appointments:  Current and Future Appointments at [facility] (90 Days) 05/10/2018 - 08/08/2018      Date Visit Type Length Department Provider     05/14/2018  3:10 PM FOLLOW-UP 20 min. [place] [[id number]] Christopher P. Simons    Patient Instructions:                         Follow-up:  Follow-up Information    None         Signed:  Phill Mutter, MD  05/10/2018  1:32 PM

## 2018-05-10 NOTE — Plan of Care (Signed)
Problem: Pain  Goal: Patient's pain/discomfort is manageable  Description  Assess and monitor patient's pain using appropriate pain scale. Collaborate with interdisciplinary team and initiate plan and interventions as ordered. Re-assess patient's pain level 30 - 60 minutes after pain management intervention.   Outcome: Progressing   Pt adm. From ED. Alert and oriented x4. Lung sounds diminished. Productive cough, with clear sputum. Skin warm, dry, and intact. Abd soft +BS. Pt independent. Blood sugar 118, no insulin ordered for tonight. Pt denies chest pain at this time.

## 2018-05-10 NOTE — Discharge Instr - Activity (Signed)
No restrictions

## 2018-05-10 NOTE — Discharge Instr - Diet (Signed)
Diet: low fat, low salt and avoid foods that worsen your acid reflux

## 2018-05-10 NOTE — Discharge Inst - Reason you came to the hospital (Signed)
Reason you came to the hospital: chest pain    Discharge Diagnosis: uncontrolled acid reflux, possible exacerbated by naprosyn    Description of Hospital Course: We checked blood work, an EKG and on the heart monitor and there were no signs of heart problems at this time. The likely cause of your chest pain is acid reflux so we recommend you stop the naprosyn and increase your home omeprazole dose to twice daily.     Important Results: see above    Reasons to call your PCP: please call you PCP to schedule an appointment for follow up in the next week

## 2018-05-10 NOTE — RN Shift Note (Addendum)
Vss.  Pt states she started w epigastric pain after being started on naprosyn.  States she is unable to take nsaids but did not realize that naprosyn was considered a NSAID.  No n/v.    All written and verbal instructions were given.  Verbalizes complete understanding of all instructions given.  Sl removed.  Awaiting ride.  Will f/u w primary.  1400 d/c off floor refused w/c.  amb off floor w steady gait.

## 2018-05-11 ENCOUNTER — Emergency Department (HOSPITAL_BASED_OUTPATIENT_CLINIC_OR_DEPARTMENT_OTHER)
Admission: RE | Admit: 2018-05-11 | Disposition: A | Discharge status: Home / Self Care | Payer: Self-pay | Source: Emergency Department | Attending: Emergency Medicine | Admitting: Emergency Medicine

## 2018-05-11 ENCOUNTER — Encounter (HOSPITAL_BASED_OUTPATIENT_CLINIC_OR_DEPARTMENT_OTHER): Payer: Self-pay | Admitting: OAS-Outpatient Addiction Svcs(PRV Practice 40)

## 2018-05-11 DIAGNOSIS — K219 Gastro-esophageal reflux disease without esophagitis: Secondary | ICD-10-CM | POA: Diagnosis not present

## 2018-05-11 DIAGNOSIS — Z7984 Long term (current) use of oral hypoglycemic drugs: Secondary | ICD-10-CM | POA: Diagnosis not present

## 2018-05-11 DIAGNOSIS — R079 Chest pain, unspecified: Secondary | ICD-10-CM | POA: Diagnosis not present

## 2018-05-11 DIAGNOSIS — I10 Essential (primary) hypertension: Secondary | ICD-10-CM | POA: Diagnosis not present

## 2018-05-11 DIAGNOSIS — R1010 Upper abdominal pain, unspecified: Secondary | ICD-10-CM | POA: Diagnosis not present

## 2018-05-11 DIAGNOSIS — E119 Type 2 diabetes mellitus without complications: Secondary | ICD-10-CM | POA: Diagnosis not present

## 2018-05-11 DIAGNOSIS — Z79899 Other long term (current) drug therapy: Secondary | ICD-10-CM | POA: Diagnosis not present

## 2018-05-11 DIAGNOSIS — E785 Hyperlipidemia, unspecified: Secondary | ICD-10-CM | POA: Diagnosis not present

## 2018-05-11 DIAGNOSIS — Z87891 Personal history of nicotine dependence: Secondary | ICD-10-CM | POA: Diagnosis not present

## 2018-05-11 NOTE — ED Triage Note (Signed)
Pt BIBA for GERD x 29months. Tonight feels worse.

## 2018-05-12 ENCOUNTER — Encounter (HOSPITAL_BASED_OUTPATIENT_CLINIC_OR_DEPARTMENT_OTHER): Payer: Self-pay

## 2018-05-12 ENCOUNTER — Other Ambulatory Visit (HOSPITAL_BASED_OUTPATIENT_CLINIC_OR_DEPARTMENT_OTHER): Payer: Self-pay

## 2018-05-12 ENCOUNTER — Telehealth (HOSPITAL_BASED_OUTPATIENT_CLINIC_OR_DEPARTMENT_OTHER): Payer: Self-pay | Admitting: Registered Nurse

## 2018-05-12 DIAGNOSIS — R079 Chest pain, unspecified: Secondary | ICD-10-CM | POA: Diagnosis not present

## 2018-05-12 DIAGNOSIS — K219 Gastro-esophageal reflux disease without esophagitis: Secondary | ICD-10-CM

## 2018-05-12 LAB — URINALYSIS RFLX TO URINE CULT
BILIRUBIN, URINE: NEGATIVE
CASTS: NONE SEEN PER LPF
CRYSTALS: NONE SEEN
GLUCOSE, URINE: NEGATIVE MG/DL
KETONE, URINE: NEGATIVE MG/DL
LEUKOCYTE ESTERASE: NEGATIVE
NITRITE, URINE: NEGATIVE
OCCULT BLOOD, URINE: NEGATIVE
PH URINE: 6 (ref 5.0–8.0)
PROTEIN, URINE: 100 MG/DL — AB
SPECIFIC GRAVITY URINE: 1.02 (ref 1.003–1.035)

## 2018-05-12 LAB — CBC, PLATELET & DIFFERENTIAL
ABSOLUTE BASO COUNT: 0 10*3/uL (ref 0.0–0.1)
ABSOLUTE EOSINOPHIL COUNT: 0.7 10*3/uL (ref 0.0–0.8)
ABSOLUTE IMM GRAN COUNT: 0.02 10*3/uL (ref 0.00–0.03)
ABSOLUTE LYMPH COUNT: 3 10*3/uL (ref 0.6–5.9)
ABSOLUTE MONO COUNT: 0.8 10*3/uL (ref 0.2–1.4)
ABSOLUTE NEUTROPHIL COUNT: 6.8 10*3/uL (ref 1.6–8.3)
BASOPHIL %: 0.3 % (ref 0.0–1.2)
EOSINOPHIL %: 6.5 % (ref 0.0–7.0)
HEMATOCRIT: 37.5 % (ref 34.1–44.9)
HEMOGLOBIN: 12.2 g/dL (ref 11.2–15.7)
IMMATURE GRANULOCYTE %: 0.2 % (ref 0.0–0.4)
LYMPHOCYTE %: 26.2 % (ref 15.0–54.0)
MEAN CORP HGB CONC: 32.5 g/dL (ref 31.0–37.0)
MEAN CORPUSCULAR HGB: 30 pg (ref 26.0–34.0)
MEAN CORPUSCULAR VOL: 92.1 fL (ref 80.0–100.0)
MEAN PLATELET VOLUME: 10.5 fL (ref 8.7–12.5)
MONOCYTE %: 7.4 % (ref 4.0–13.0)
NEUTROPHIL %: 59.4 % (ref 40.0–75.0)
PLATELET COUNT: 308 10*3/uL (ref 150–400)
RBC DISTRIBUTION WIDTH STD DEV: 48.2 fL — ABNORMAL HIGH (ref 35.1–46.3)
RBC DISTRIBUTION WIDTH: 14.8 % — ABNORMAL HIGH (ref 11.5–14.3)
RED BLOOD CELL COUNT: 4.07 M/uL (ref 3.90–5.20)
WHITE BLOOD CELL COUNT: 11.4 10*3/uL — ABNORMAL HIGH (ref 4.0–11.0)

## 2018-05-12 LAB — COMPREHENSIVE METABOLIC PANEL
ALANINE AMINOTRANSFERASE: 33 U/L (ref 12–45)
ALBUMIN: 3.8 g/dL (ref 3.4–5.0)
ALKALINE PHOSPHATASE: 91 U/L (ref 45–117)
ANION GAP: 10 mmol/L (ref 5–15)
ASPARTATE AMINOTRANSFERASE: 22 U/L (ref 8–34)
BILIRUBIN TOTAL: 0.2 mg/dL (ref 0.2–1.0)
BUN (UREA NITROGEN): 15 mg/dL (ref 7–18)
CALCIUM: 9.2 mg/dL (ref 8.5–10.1)
CARBON DIOXIDE: 28 mmol/L (ref 21–32)
CHLORIDE: 104 mmol/L (ref 98–107)
CREATININE: 0.7 mg/dL (ref 0.4–1.2)
ESTIMATED GLOMERULAR FILT RATE: >60 mL/min (ref >60)
Glucose Random: 106 mg/dL (ref 74–160)
POTASSIUM: 4 mmol/L (ref 3.5–5.1)
SODIUM: 142 mmol/L (ref 136–145)
TOTAL PROTEIN: 6.8 g/dL (ref 6.4–8.2)

## 2018-05-12 LAB — LIPASE: LIPASE: 154 U/L (ref 73–393)

## 2018-05-12 LAB — URINE CULTURE WILL NOT BE DONE

## 2018-05-12 LAB — XR CHEST 2 VIEWS

## 2018-05-12 LAB — MAGNESIUM: MAGNESIUM: 1.7 mg/dL — ABNORMAL LOW (ref 1.8–2.4)

## 2018-05-12 MED ORDER — FAMOTIDINE 20 MG PO TABS
20.0000 mg | ORAL_TABLET | Freq: Once | ORAL | Status: AC
Start: 2018-05-12 — End: 2018-05-12
  Administered 2018-05-12: 20 mg via ORAL
  Filled 2018-05-12: qty 1

## 2018-05-12 MED ORDER — DICYCLOMINE HCL 10 MG PO CAPS
10.00 mg | ORAL_CAPSULE | Freq: Once | ORAL | Status: AC
Start: 2018-05-12 — End: 2018-05-12
  Administered 2018-05-12: 10 mg via ORAL
  Filled 2018-05-12: qty 1

## 2018-05-12 MED ORDER — SIMETHICONE 80 MG PO CHEW
80.00 mg | CHEWABLE_TABLET | Freq: Once | ORAL | Status: AC
Start: 2018-05-12 — End: 2018-05-12
  Administered 2018-05-12: 80 mg via ORAL
  Filled 2018-05-12: qty 1

## 2018-05-12 MED ORDER — ONDANSETRON HCL 4 MG/2ML IJ SOLN
4.0000 mg | Freq: Once | INTRAMUSCULAR | Status: AC
Start: 2018-05-12 — End: 2018-05-12
  Administered 2018-05-12: 4 mg via INTRAVENOUS
  Filled 2018-05-12: qty 2

## 2018-05-12 MED ORDER — ALUMINUM & MAGNESIUM HYDROXIDE 200-200 MG/5ML PO SUSP
30.0000 mL | Freq: Once | ORAL | Status: AC
Start: 2018-05-12 — End: 2018-05-12
  Administered 2018-05-12: 30 mL via ORAL
  Filled 2018-05-12: qty 30

## 2018-05-12 MED ORDER — LIDOCAINE VISCOUS 2 % MT SOLN
10.0000 mL | Freq: Once | OROMUCOSAL | Status: AC
Start: 2018-05-12 — End: 2018-05-12
  Administered 2018-05-12: 10 mL via OROMUCOSAL
  Filled 2018-05-12: qty 15

## 2018-05-12 NOTE — Progress Notes (Signed)
RN returned pt call.  Pt was looking for recommendations for diet.  RN recommended bland non spicy foods to help stop heartburn.

## 2018-05-12 NOTE — Telephone Encounter (Signed)
Called and spoke with patient she reports she had another episode last night where she felt pain come up her esophagus in to her throat then felt like she could not breathe so she returned to ED, was there until 6 am this morning.  Meds prescribed and she is taking them as prescribed.  Was advised by ED to see GI soon.    Feels okay this am, is trying to watch what she eats and taking meds as prescribed.    Has appt with pcp on Wednesday, asks if provider can start referral for GI.

## 2018-05-12 NOTE — ED Provider Notes (Signed)
I have reviewed the ED nursing notes and prior records. I have reviewed the patient's past medical history/problem list, allergies, social history and medication list.  I saw this patient primarily.    HPI:  This 62 year old female patient presents with chief complaint of abdominal pain.  It's in her upper abdomen, similar to pain she had had in the past.  Pt was just discharged from the hospital yesterday.  She feels like it's her GERD, it feels like burning that goes from her upper abdomen up to her chest.  No n/v.  No d/c.  No fevers, chills.  No cough.  No dysuria/hematuria/urinary frequency.      ROS: Pertinent positives were reviewed as per the HPI above. All other systems were reviewed and are negative.    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  No date: Esophageal reflux  No date: External hemorrhoid      Comment:  per steward records  No date: HTN (hypertension)  02/01/2012: Hypoxia  02/16/2017: Left ear pain  06/07/2015: Left genital labial abscess  06/16/2015: Obesity, Class III, 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y) ([place])  08/16/2015: Osteopenia      Comment:  On xray of left knee per steward records 06/2014  Unclear                if addressed  09/14/2015: Right ear impacted cerumen  08/15/2015: Type 2 diabetes mellitus without complication, without   long-term current use of insulin (HCC)      Comment:   HEMOGLOBIN A1C (%) Date Value 07/15/2015 6.2 (H)                ----------  [facility] record - a1c 6.2 on 01/31/15 6.4 on                09/30/14 5.9 on 12/29/13 6.4 08/18/13   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) ([place])     Tobacco use disorder     Spinal stenosis of lumbar region     HLD (hyperlipidemia)     Chronic bilateral low back pain with bilateral sciatica     Skin change     Pineal gland cyst     History of vitamin D deficiency     Hemorrhoids     Venous (peripheral) insufficiency     Adrenal adenoma  Type 2 diabetes mellitus with microalbuminuria, without long-term current use of insulin (HCC)     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 (HCC)     Acute pain of right wrist     Abnormal PFTs     Screening for malignant neoplasm of breast     Skin irritation     H/O bone density study     Vertigo     Orthostatic hypotension     Social problem     Right arm weakness     Insomnia     Acute pain of left knee     Hx of total knee replacement, left     Acute bacterial sinusitis     Seasonal allergic rhinitis     Left foot pain     Stress due to family tension     Gastritis without bleeding     Leucocytosis     Viral URI with cough     Gastroesophageal reflux disease without esophagitis      Past Surgical History:  Past Surgical History:  No date: ANES NERVE MUSC TENDON FASCIA&BURSA KNEE&/POPLT  No date: FOOT SURGERY      Comment:  bilateral  No date: LAPAROSCOPY SURG CHOLECYSTECTOMY  No date: OB ANTEPARTUM CARE CESAREAN DLVR & POSTPARTUM      Comment:  x3  No date: TONSILLECTOMY & ADENOIDECTOMY <AGE 55  04/13/2009: TOTAL KNEE REPLACEMENT      Comment:  left  08/11/2009: WRIST GANGLION EXCISION      Comment:  left     Medications:      Prior to  Admission Medications   Prescriptions Last Dose Informant Patient Reported? Taking?   ONETOUCH DELICA LANCETS FINE MISC   No No   Sig: TEST once daily   ONETOUCH VERIO test strip   No No   Sig: TEST once daily   Omega-3 Fatty Acids (FISH OIL) 1000 MG CAPS   Yes No   Sig: Take by mouth   Omeprazole 20 MG TBEC   No No   Sig: Take 1 tablet by mouth 2 (two) times daily Take 30 minutes before breakfast   VOLTAREN 1 % GEL gel   No No   Sig: apply 4 grams to affected area twice a day DO NOT EXCEED 32 G IN 1 DAY   albuterol (PROVENTIL HFA,VENTOLIN HFA, PROAIR HFA) 108 (90 Base) MCG/ACT inhaler   No No   Sig: Inhale 2 puffs into the lungs every 6 (six) hours as needed for Wheezing or Shortness of breath   albuterol (PROVENTIL) (2.5 MG/3ML) 0.083% nebulizer solution   No No   Sig: Take 3 mLs by nebulization every 6 (six) hours as needed for Wheezing   aspirin 81 MG tablet   Yes No   Sig: Take 81 mg by mouth daily.   atorvastatin (LIPITOR) 20 MG tablet   No No   Sig: take 1 tablet by mouth once daily   cholecalciferol (VITAMIN D3) 1000 UNIT tablet   Yes No   Sig: Take 1 tablet by mouth daily   hydrOXYzine (ATARAX) 10 MG tablet   No No   Sig: take 1-2 tablets by mouth once daily if needed for anxiety   lisinopril (PRINIVIL,ZESTRIL) 20 MG tablet   No No   Sig: take 1 tablet by mouth once daily   metFORMIN (GLUCOPHAGE-XR) 500 MG 24 hr tablet   No No   Sig: take 4 tablets by mouth once daily WITH BREAKFAST  metoprolol (TOPROL-XL) 25 MG 24 hr tablet   No No   Sig: Take 1 tablet by mouth daily   oxycodone-acetaminophen (PERCOCET) 5-325 MG per tablet   Yes No   Sig: Take 1 tablet by mouth 2 (two) times daily as needed With Dr Dwyane Dee   pregabalin (LYRICA) 100 MG capsule   Yes No   Sig: Take 100 mg by mouth 2 (two) times daily With Dr Dwyane Dee   sucralfate (CARAFATE) 1 GM tablet   No No   Sig: Take 1 tablet by mouth 3 (three) times daily for 14 days      Facility-Administered Medications: None       Social History:  Social History     Tobacco Use      Smoking status: Former Smoker        Packs/day: 0.50        Years: 31.00        Pack years: 15.5        Types: Cigarettes        Quit date: 03/10/2018        Years since quitting: 0.1      Smokeless tobacco: Never Used      Tobacco comment: quit smoking inform provided to patient    Alcohol use: No      Allergies:  Review of Patient's Allergies indicates:   Codeine camsylate       Shortness of Breath, Other (See                            Comments)    Comment:Chest pain   Motrin [ibuprofen]      Nausea Only   Sulfa antibiotics       Other (See Comments)   Bactrim                 Rash, Itching    Physical Exam:  BP 136/64  Pulse 99  Temp 97.9 F  Resp 18  Wt 108.9 kg (240 lb)  LMP 10/22/2007  SpO2 94%  BMI 45.61 kg/m2  GENERAL:  Well-appearing, no distress.  SKIN:  Warm & Dry, no rash  HEAD:  Atraumatic. Anicteric sclera.   NECK:  Supple with full painless ROM at the neck  LUNGS:  Clear to auscultation bilaterally without rales, rhonchi or wheezing.   HEART:  RRR.  No murmurs, rubs, or gallops.   ABDOMEN:  Nontender to palpation.  Obese.    MUSCULOSKELETAL:  No deformities.   NEUROLOGIC:  Normal speech.  Moving all four extremities.  PSYCHIATRIC:  Normal affect    DIAGNOSTICS:     Results for orders placed or performed during the hospital encounter of 05/11/18 (from the past 24 hour(s))   CBC, Platelet & Differential    Collection Time: 05/12/18 12:29 AM   Result Value    WHITE BLOOD CELL COUNT 11.4 (H)    RED BLOOD CELL COUNT 4.07    HEMOGLOBIN 12.2    HEMATOCRIT 37.5    MEAN CORPUSCULAR VOL 92.1    MEAN CORPUSCULAR HGB 30.0    MEAN CORP HGB CONC 32.5    RBC DISTRIBUTION WIDTH STD DEV 48.2 (H)    RBC DISTRIBUTION WIDTH 14.8 (H)    PLATELET COUNT 308    MEAN PLATELET VOLUME 10.5    NEUTROPHIL % 59.4    IMMATURE GRANULOCYTE % 0.2    LYMPHOCYTE % 26.2    MONOCYTE % 7.4    EOSINOPHIL %  6.5    BASOPHIL % 0.3    ABSOLUTE NEUTROPHIL COUNT 6.8    ABSOLUTE IMM GRAN COUNT 0.02    ABSOLUTE LYMPH COUNT 3.0     ABSOLUTE MONO COUNT 0.8    ABSOLUTE EOSINOPHIL COUNT 0.7    ABSOLUTE BASO COUNT 0.0   Comprehensive Metabolic Panel    Collection Time: 05/12/18 12:29 AM   Result Value    SODIUM 142    POTASSIUM 4.0    CHLORIDE 104    CARBON DIOXIDE 28    ANION GAP 10    CALCIUM 9.2    Glucose Random 106    BUN (UREA NITROGEN) 15    TOTAL PROTEIN 6.8    ALBUMIN 3.8    BILIRUBIN TOTAL 0.2    ALKALINE PHOSPHATASE 91    ASPARTATE AMINOTRANSFERASE 22    CREATININE 0.7    ESTIMATED GLOMERULAR FILT RATE > 60    ALANINE AMINOTRANSFERASE 33   Magnesium    Collection Time: 05/12/18 12:29 AM   Result Value    MAGNESIUM 1.7 (L)   Lipase    Collection Time: 05/12/18 12:29 AM   Result Value    LIPASE 154   Urinalysis Rflx to Urine Cult    Collection Time: 05/12/18  3:58 AM   Result Value    COLOR YELLOW    CLARITY CLEAR    GLUCOSE, URINE NEGATIVE    BILIRUBIN, URINE NEGATIVE    KETONE, URINE NEGATIVE    SPECIFIC GRAVITY URINE 1.020    OCCULT BLOOD, URINE NEGATIVE    PH URINE 6.0    PROTEIN, URINE 100 (A)    NITRITE, URINE NEGATIVE    LEUKOCYTE ESTERASE NEGATIVE    MICROSCOPIC SEE RESULTS (A)    WHITE BLOOD CELLS URINE RARE 0-2    RED BLOOD CELLS URINE RARE 0-2    BACTERIA 10-50 (A)    CRYSTALS NONE SEEN    CASTS NONE SEEN    SQUAMOUS EPITHELIAL CELLS 0-4    Narrative    UCV&Urine, Clean Void   Urine Culture Will Not Be Done    Collection Time: 05/12/18  4:51 AM   Result Value    URINE CULTURE WILL NOT BE DONE      A UA Reflex UC was ordered for this specimen.  The urine  culture will not be done because the criteria for performing  the urine culture of >10 WBC/HPF was not met on the  Urinalysis microscopic result.  If a culture is determined to be clinically necessary,  please contact the laboratory within 24 hours of the  specimen collection to add the urine culture.         ED Course and Medical Decision-making:    The patient is 62 year old female with upper abd pain.  Feels like it's her GERD.  Feels like burning.      Patient discharged  from the hospital yesterday, discharge diagnosis was gastroesophageal reflux disease.  She was admitted for ACS rule out.    Patient's blood work is unremarkable.  Chest x-ray shows no acute cardiopulmonary abnormality.    EKG is nonischemic.  Normal sinus rhythm at a rate of 81, no ST elevations, no ST depressions, no T wave abnormalities, normal intervals.    UA with no signs of infection.     Patient feels improved after stay in the emergency room, plan for discharge with close follow-up.  Patient is aware that she may benefit from an upper endoscopy, she plans  to follow-up with her primary care physician and GI.    VSS BP 136/64  Pulse 99  Temp 97.9 F  Resp 18  Wt 108.9 kg (240 lb)  LMP 10/22/2007  SpO2 94%  BMI 45.61 kg/m2    Reasons to return to the ED were reviewed in detail. The patient agrees with this plan and disposition.    Disposition: Discharge    Diagnosis/Diagnoses:  Abdominal pain, unspecified abdominal location    Critical care time outside of procedures: 0    Atilano Median, MD  Attending [place]

## 2018-05-12 NOTE — Telephone Encounter (Signed)
Regarding: DIFFICULTY BREATHING  ----- Message from Quintin Alto sent at 05/12/2018  9:35 AM EDT -----  Samantha Cameron [id number], 62 year old, female    Calls today:  Sick    What are the symptoms difficulty breathing  Person calling on behalf of patient: Patient (self)    Patient's language of care: English    Patient does not need an interpreter.    Patient's PCP: Suann Larry. Simons,MD

## 2018-05-12 NOTE — Narrator Note (Signed)
Pt cleared for discharge, exit care instructions reviewed with pt. Pt verbalized understanding of discharge teaching.

## 2018-05-12 NOTE — ED Notes (Signed)
XR @ 12:26

## 2018-05-12 NOTE — Telephone Encounter (Signed)
Gi referral placed  

## 2018-05-12 NOTE — Narrator Note (Signed)
Patient Disposition    Patient education for diagnosis, medications, activity, diet and follow-up.  Patient left ED 5:18 AM.  Patient rep received written instructions.  Interpreter to provide instructions: No    Patient belongings with patient: YES    Have all existing LDAs been addressed? Yes    Have all IV infusions been stopped? N/A    Discharged to: Discharged to home

## 2018-05-12 NOTE — Narrator Note (Signed)
Pt observed in bed with eyes closed. Respirations even and non labored. Pt verbalized  Improvement of symptoms

## 2018-05-13 ENCOUNTER — Telehealth (HOSPITAL_BASED_OUTPATIENT_CLINIC_OR_DEPARTMENT_OTHER): Payer: Self-pay | Admitting: Internal Medicine

## 2018-05-13 ENCOUNTER — Ambulatory Visit (HOSPITAL_BASED_OUTPATIENT_CLINIC_OR_DEPARTMENT_OTHER): Payer: Self-pay

## 2018-05-13 ENCOUNTER — Encounter (HOSPITAL_BASED_OUTPATIENT_CLINIC_OR_DEPARTMENT_OTHER): Payer: Self-pay | Admitting: OAS-Outpatient Addiction Svcs(PRV Practice 40)

## 2018-05-13 ENCOUNTER — Emergency Department (HOSPITAL_BASED_OUTPATIENT_CLINIC_OR_DEPARTMENT_OTHER)
Admission: RE | Admit: 2018-05-13 | Disposition: A | Discharge status: Home / Self Care | Payer: Self-pay | Source: Emergency Department | Attending: Emergency Medicine | Admitting: Emergency Medicine

## 2018-05-13 ENCOUNTER — Encounter (HOSPITAL_BASED_OUTPATIENT_CLINIC_OR_DEPARTMENT_OTHER): Payer: Self-pay

## 2018-05-13 ENCOUNTER — Encounter (HOSPITAL_BASED_OUTPATIENT_CLINIC_OR_DEPARTMENT_OTHER): Payer: Self-pay | Admitting: Emergency Medicine

## 2018-05-13 DIAGNOSIS — F419 Anxiety disorder, unspecified: Secondary | ICD-10-CM | POA: Diagnosis not present

## 2018-05-13 DIAGNOSIS — Z79899 Other long term (current) drug therapy: Secondary | ICD-10-CM | POA: Diagnosis not present

## 2018-05-13 DIAGNOSIS — I10 Essential (primary) hypertension: Secondary | ICD-10-CM | POA: Diagnosis not present

## 2018-05-13 DIAGNOSIS — E119 Type 2 diabetes mellitus without complications: Secondary | ICD-10-CM | POA: Diagnosis not present

## 2018-05-13 DIAGNOSIS — Z7951 Long term (current) use of inhaled steroids: Secondary | ICD-10-CM | POA: Diagnosis not present

## 2018-05-13 DIAGNOSIS — R079 Chest pain, unspecified: Secondary | ICD-10-CM | POA: Diagnosis not present

## 2018-05-13 DIAGNOSIS — Z87891 Personal history of nicotine dependence: Secondary | ICD-10-CM | POA: Diagnosis not present

## 2018-05-13 DIAGNOSIS — R12 Heartburn: Secondary | ICD-10-CM | POA: Diagnosis not present

## 2018-05-13 DIAGNOSIS — K219 Gastro-esophageal reflux disease without esophagitis: Secondary | ICD-10-CM | POA: Diagnosis not present

## 2018-05-13 DIAGNOSIS — Z7984 Long term (current) use of oral hypoglycemic drugs: Secondary | ICD-10-CM | POA: Diagnosis not present

## 2018-05-13 DIAGNOSIS — R0602 Shortness of breath: Secondary | ICD-10-CM | POA: Diagnosis not present

## 2018-05-13 DIAGNOSIS — J449 Chronic obstructive pulmonary disease, unspecified: Secondary | ICD-10-CM | POA: Diagnosis not present

## 2018-05-13 DIAGNOSIS — R1013 Epigastric pain: Secondary | ICD-10-CM | POA: Diagnosis not present

## 2018-05-13 LAB — EKG

## 2018-05-13 LAB — TROPONIN I: TROPONIN I: <0.02 ng/mL (ref 0.00–0.04)

## 2018-05-13 MED ORDER — IPRATROPIUM-ALBUTEROL 0.5-2.5 (3) MG/3ML IN SOLN
3.0000 mL | Freq: Once | RESPIRATORY_TRACT | Status: AC
Start: 2018-05-13 — End: 2018-05-13
  Administered 2018-05-13: 3 mL via RESPIRATORY_TRACT
  Filled 2018-05-13: qty 3

## 2018-05-13 MED ORDER — SUCRALFATE 1 GM/10ML PO SUSP
2.00 g | Freq: Once | ORAL | Status: AC
Start: 2018-05-13 — End: 2018-05-13
  Administered 2018-05-13: 2 g via ORAL
  Filled 2018-05-13: qty 20

## 2018-05-13 MED ORDER — ALUMINUM & MAGNESIUM HYDROXIDE 200-200 MG/5ML PO SUSP
30.0000 mL | Freq: Once | ORAL | Status: AC
Start: 2018-05-13 — End: 2018-05-13
  Administered 2018-05-13: 30 mL via ORAL
  Filled 2018-05-13: qty 30

## 2018-05-13 MED ORDER — FAMOTIDINE 20 MG/2ML IV SOLN
20.0000 mg | Freq: Once | INTRAVENOUS | Status: AC
Start: 2018-05-13 — End: 2018-05-13
  Administered 2018-05-13: 20 mg via INTRAVENOUS
  Filled 2018-05-13: qty 2

## 2018-05-13 MED ORDER — LIDOCAINE VISCOUS 2 % MT SOLN
10.0000 mL | Freq: Once | OROMUCOSAL | Status: AC
Start: 2018-05-13 — End: 2018-05-13
  Administered 2018-05-13: 10 mL via OROMUCOSAL
  Filled 2018-05-13: qty 15

## 2018-05-13 NOTE — Narrator Note (Signed)
Pt resting in bed and remains NSR. No sign of distress. Troponin = 0.02. Neb tx currently in progress.

## 2018-05-13 NOTE — Narrator Note (Signed)
Pt when resting desaturates to high 80's on RA. Placed on 2L NC. WHen pt is awake and interactive with staff, O2 saturation returns to > 94%.

## 2018-05-13 NOTE — Progress Notes (Signed)
Called patient   Reviewed again that we will need to see her in office to discuss further anxiety treatment  For tonight, patient can try deep breathing or other relaxation techniques  If anxiety remains so strong that she cannot wait until office visit tomorrow - would recommend returning to ED

## 2018-05-13 NOTE — Telephone Encounter (Signed)
Called and spoke with patient she reports she went to ED again last night, reports she had one of her reflux episodes and it sends hr in to a panic, hydroxyzine does not seem to be helping her anxiety, she feels she needs something else.    She is taking other meds as prescribed. Insurance would not cover carafate in liquid form, she has trouble swallowing pill, she broke it in half and was able to get it down but she felt like it got stuck for a minute. Advised she may crush it and put it in some water in order to take it.    Patient has appt tomorrow with pcp, did advise provider entered GI referral for her, appt not scheduled yet.    Asks I there is anything else she can do in meantime, advised I would send message to provider.

## 2018-05-13 NOTE — Narrator Note (Signed)
Pt states she's feeling better. Remains SR on cardiac monitor. MD updated. Will monitor.

## 2018-05-13 NOTE — ED Triage Note (Signed)
Pt presents with CP, SOB, something feels stuck in her throat. Pt was seen last night for similar symptoms

## 2018-05-13 NOTE — Discharge Instructions (Signed)
DIAGNOSIS & TREATMENT:  You were seen in a [facility] Emergency Department for abdominal pain radiating to your chest.   We think your symptoms are due to acid reflux.  You were treated with Maalox, viscous lidocaine, Pepcid, and Carafate.  You were also given a DuoNeb for your COPD.    TEST RESULTS:  Your EKG was normal.  Your blood tests showed no damage to the heart.    FURTHER CARE:  Continue your home medications as prescribed.     WHEN SHOULD YOU BE SEEN NEXT?   Please follow-up with your doctor at your scheduled appointment tomorrow.    WHEN SHOULD YOU RETURN TO THE ED?  Please contact a care provider or return to the Emergency Department (ED) if you experience new or worsening symptoms, or for any other concerns.

## 2018-05-13 NOTE — ED Notes (Signed)
Bed: 04 Trauma  Expected date:   Expected time:   Means of arrival:   Comments:  CHEST PAIN

## 2018-05-13 NOTE — Progress Notes (Signed)
Pt was admitted over the weekend for chest pain.  Pt will be followed by the [facility] program for 30 days.      Upon outreach, will offer community services and supports.      [facility]  Phone: [phone]  [email or web address]

## 2018-05-13 NOTE — Progress Notes (Signed)
Please note     The patient is calling back she is stating that her panic attacks are not going away and she cant wait to be seen tomorrow. She would like for a medication to be send Today. I told her that per the notes you will discuss her her options at [place] appointment ,and that I will send a message to you with this request. she wants to know if you expect her to go back to the ER to get this resolved.      Please review Thank you!

## 2018-05-13 NOTE — Narrator Note (Signed)
Pt ambulated to and from bathroom with steady gait. Placed back on cardiac monitor. Pt calm and collected. Will monitor.

## 2018-05-13 NOTE — ED Provider Notes (Signed)
[facility] Attending Note     History of Present Illness:    Samantha Cameron is a 62 year old female patient with past medical history of gastroesophageal reflux disease, who presents with epigastric pain and burning radiating up to her chest.  Patient was seen yesterday with the same symptoms admitted to the hospital for ACS rule out.  ACS was ruled out, and symptoms were thought to be secondary to gastroesophageal reflux disease.  Patient states that she took her antacid and Carafate twice yesterday.  She had some toast and milk at 3 PM.  Shortly thereafter, she started having the epigastric pain again.  Tonight she was getting very anxious about it, with shortness of breath, and could not calm down, so she came to the emergency department.  She reports that she does normally take nebulizers every 6 hours for her COPD.  Her last nebulizer dose was about 12 hours ago.  No fever, cough, vomiting, diarrhea, dysuria, hematuria, leg pain or swelling. The patient was seen primarily by me. ED nursing record was reviewed. Prior records as available electronically through the Epic record were reviewed.    Patient's mode of arrival was by Self  Arrival time:     Chief complaint: Chest Pain (CHEST PAIN//SOB) and Breathing Problem          Review of Systems:  As per HPI.      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  No date: Esophageal reflux  No date: External hemorrhoid      Comment:  per steward records  No date: HTN (hypertension)  02/01/2012: Hypoxia  02/16/2017: Left ear pain  06/07/2015: Left genital labial abscess  06/16/2015: Obesity, Class III, 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y) ([place])  08/16/2015: Osteopenia      Comment:  On xray of left knee per steward records 06/2014 Unclear                if addressed  09/14/2015: Right ear impacted cerumen  08/15/2015: Type 2 diabetes mellitus without complication, without   long-term current use of insulin (HCC)      Comment:   HEMOGLOBIN A1C (%) Date Value 07/15/2015 6.2 (H)                ----------  Steward record - a1c 6.2 on 01/31/15 6.4 on  09/30/14 5.9 on 12/29/13 6.4 08/18/13   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) ([place])     Tobacco use disorder     Spinal stenosis of lumbar region     HLD (hyperlipidemia)     Chronic bilateral low back pain with bilateral sciatica     Skin change     Pineal gland cyst     History of vitamin D deficiency     Hemorrhoids     Venous (peripheral) insufficiency     Adrenal adenoma     Type 2 diabetes mellitus with microalbuminuria, without long-term current use of insulin ([place])     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 (HCC)     Acute pain of right wrist     Abnormal PFTs     Screening for malignant neoplasm of breast     Skin irritation     H/O bone density study     Vertigo     Orthostatic hypotension     Social problem     Right arm weakness     Insomnia     Acute pain of left knee     Hx of total knee replacement, left     Acute bacterial sinusitis     Seasonal allergic rhinitis     Left foot pain     Stress due to family tension     Gastritis without bleeding      Leucocytosis     Viral URI with cough     Gastroesophageal reflux disease without esophagitis   Meds:  Current Facility-Administered Medications   Medication    ipratropium-albuterol (DUO-NEB) 0.5-2.5 (3) MG/3ML nebulizer solution 3 mL     Current Outpatient Medications   Medication Sig    Omeprazole 20 MG TBEC Take 1 tablet by mouth 2 (two) times daily Take 30 minutes before breakfast    sucralfate (CARAFATE) 1 GM tablet Take 1 tablet by mouth 3 (three) times daily for 14 days    VOLTAREN 1 % GEL gel apply 4 grams to affected area twice a day DO NOT EXCEED 32 G IN 1 DAY    hydrOXYzine (ATARAX) 10 MG tablet take 1-2 tablets by mouth once daily if needed for anxiety    albuterol (PROVENTIL) (2.5 MG/3ML) 0.083% nebulizer solution Take 3 mLs by nebulization every 6 (six) hours as needed for Wheezing    metoprolol (TOPROL-XL) 25 MG 24 hr tablet Take 1 tablet by mouth daily    lisinopril (PRINIVIL,ZESTRIL) 20 MG tablet take 1 tablet by mouth once daily    ONETOUCH DELICA LANCETS FINE MISC TEST once daily    metFORMIN (GLUCOPHAGE-XR) 500 MG 24 hr tablet take 4 tablets by mouth once daily WITH BREAKFAST    atorvastatin (LIPITOR) 20 MG tablet take 1 tablet by mouth once daily    ONETOUCH VERIO test strip TEST once daily    albuterol (PROVENTIL HFA,VENTOLIN HFA, PROAIR HFA) 108 (90 Base) MCG/ACT inhaler Inhale 2 puffs into the lungs every 6 (six) hours as needed for Wheezing or Shortness of breath    Omega-3 Fatty Acids (FISH OIL) 1000 MG CAPS Take by mouth    cholecalciferol (VITAMIN D3) 1000 UNIT tablet Take 1 tablet by mouth daily    pregabalin (LYRICA) 100 MG capsule Take 100 mg by mouth 2 (two) times daily With Dr Dwyane Dee    oxycodone-acetaminophen (PERCOCET) 5-325 MG per tablet Take 1 tablet by mouth 2 (two)  times daily as needed With Dr Dwyane Dee    aspirin 81 MG tablet Take 81 mg by mouth daily.         Past Surgical Hx:  Past Surgical History:  No date: ANES NERVE MUSC TENDON FASCIA&BURSA KNEE&/POPLT  No  date: FOOT SURGERY      Comment:  bilateral  No date: LAPAROSCOPY SURG CHOLECYSTECTOMY  No date: OB ANTEPARTUM CARE CESAREAN DLVR & POSTPARTUM      Comment:  x3  No date: TONSILLECTOMY & ADENOIDECTOMY <AGE 35  04/13/2009: TOTAL KNEE REPLACEMENT      Comment:  left  08/11/2009: WRIST GANGLION EXCISION      Comment:  left  Allergies:  Review of Patient's Allergies indicates:   Codeine camsylate       Shortness of Breath, Other (See                            Comments)    Comment:Chest pain   Motrin [ibuprofen]      Nausea Only   Sulfa antibiotics       Other (See Comments)   Bactrim                 Rash, Itching   Social Hx:  Social History    Tobacco Use      Smoking status: Former Smoker        Packs/day: 0.50        Years: 31.00        Pack years: 15.5        Types: Cigarettes        Quit date: 03/10/2018        Years since quitting: 0.1      Smokeless tobacco: Never Used      Tobacco comment: quit smoking inform provided to patient    Alcohol use: No   Immunizations:  Immunization History   Administered Date(s) Administered    HEP B ADULT 3 DOSE 20 and > 08/15/2015, 09/14/2015, 05/17/2016    INFLUENZA VIRUS TRI W/PRESV VACCINE 18/> YRS IM (PRIVATE) 10/14/2007, 09/17/2008, 09/01/2009, 08/07/2010, 08/14/2011, 09/03/2011, 09/12/2012, 08/18/2013, 08/24/2014, 06/30/2015    Influenza Virus Quad Presv Free Vacc 3/> Yrs IM 06/30/2015, 12/11/2016, 07/02/2017    Influenza Virus Quadrivalent Vacc 3/> Yrs Im 06/30/2015, 07/02/2017    Influenza Virus Tri Presv Free 3/> YRS IM 07/03/2016    PNEUMOCOCCAL POLYSACCHARIDE VACCINE v23 03/12/2014, 08/15/2015, 12/01/2015    Pneumococcal Vaccine, Conjugate V7 01/31/2009    Td 11/01/2001    Tdap 09/17/2008, 12/11/2016    ZOSTER SHINGLES VACC, LIVE SC 08/24/2008, 12/01/2015    Zoster Vacc (HZV) Recomb Adjv, IM, 2 Dose, (SHINGRIX) 05/30/2017, 07/17/2017        Physical Examination:       ED Triage Vitals [05/13/18 0053]   ED Triage Vitals Brief Group      Temp 98.1 F      Pulse  105      Resp 20      BP 151/67      SpO2 98 %      Pain Score 6        GENERAL:   no acute distress, non-toxic   SKIN:  Warm & Dry, no rash  HEAD:  NCAT. Sclerae are anicteric and aninjected  NECK:  Supple. No stridor.  LUNGS:  Clear to auscultation bilaterally. No wheezes, rales, rhonchi.   HEART:  RRR.  No murmurs, rubs, or gallops.   ABDOMEN:  Soft, NTND.  No involuntary guarding or rebound.   EXTREMITIES:  No obvious deformities.  Warm and well perfused.  No edema.    NEUROLOGIC:  Alert and oriented x3; moves all extremities well; speaking in clear fluent sentences.   PSYCHIATRIC: Anxious, cooperative    Medications Given in the ED:  Medications   famotidine (PEPCID) injection 20 mg (20 mg Intravenous Given 05/13/18 0126)   lidocaine (XYLOCAINE) 2 % viscous solution 10 mL (10 mLs Mouth/Throat Given 05/13/18 0126)   aluminum-magnesium hydroxide (MAALOX) 200-200 mg/5 mL suspension 30 mL (30 mLs Oral Given 05/13/18 0126)   ipratropium-albuterol (DUO-NEB) 0.5-2.5 (3) MG/3ML nebulizer solution 3 mL (3 mLs Nebulization Given 05/13/18 0153)   sucralfate (CARAFATE) 1 GM/10ML suspension 2 g (2 g Oral Given 05/13/18 0320)    Radiology and ECG:  No orders to display    Electrocardiogram: Normal sinus rhythm with a rate of 96, normal intervals, normal axis, no ischemic changes         Lab Results:  Labs Reviewed   TROPONIN I       Results for orders placed or performed during the hospital encounter of 05/13/18 (from the past 24 hour(s))   Troponin I    Collection Time: 05/13/18  1:10 AM   Result Value    TROPONIN I < 0.02    Vital Signs:  Patient Vitals for the past 720 hrs:   Temp Pulse Resp BP SpO2   05/13/18 0053 98.1 F 105 20 151/67 98 %          ED Course and Medical Decision-making:  This 62 year old female patient presents with epigastric pain radiating to her chest, most consistent with her known gastroesophageal reflux disease.  Per history, patient likely had increased anxiety and possibly a panic attack set off by  the symptoms, prompting her to return to the emergency department.  History is not suggestive of cardiac ischemia.  Given her age and risk factors, I did obtain a single troponin which was negative.  Given the duration of her symptoms for over 10 hours, I do not think she needs repeat testing at this time.  EKG is reassuring.  Patient was given IV Pepcid, GI cocktail, and a DuoNeb after which she was feeling better and was able to fall asleep.  On reevaluation, when she awoke, she was again immediately feeling anxious and complaining of epigastric pain.  She was given a dose of Carafate, after which she again fell asleep.  On reevaluation, patient had improvement in her symptoms.  She was discharged home to follow-up with her outpatient providers regarding ongoing management of her gastroesophageal reflux disease.    Reasons to return to the ED were reviewed in detail. All questions were answered. The patient agrees with this plan and disposition.      Condition on Discharge: Improved      Diagnosis/Diagnoses:  Gastroesophageal reflux disease, esophagitis presence not specified    Medications:   Orders Placed This Encounter      famotidine (PEPCID) injection 20 mg      lidocaine (XYLOCAINE) 2 % viscous solution 10 mL      aluminum-magnesium hydroxide (MAALOX) 200-200 mg/5 mL suspension 30 mL      ipratropium-albuterol (DUO-NEB) 0.5-2.5 (3) MG/3ML nebulizer solution 3 mL      sucralfate (CARAFATE) 1 GM/10ML suspension 2 g      Clarita Crane, MD, MPH  05/13/2018   Dept.of Emergency [place]    This  Emergency Department patient encounter note was created using voice-recognition software and in real time during the ED visit. Please excuse any typographical errors that have not yet been reviewed and corrected.

## 2018-05-13 NOTE — Narrator Note (Signed)
Patient Disposition    Patient education for diagnosis, medications, activity, diet and follow-up.  Patient left ED 4:29 AM.  Patient rep received written instructions.  Interpreter to provide instructions: No    Patient belongings with patient: YES    Have all existing LDAs been addressed? Yes    Have all IV infusions been stopped? N/A    Discharged to: Discharged to home    Pt discharged. RN reviewed discharge instructions with pt. All questions/concerns addressed.

## 2018-05-13 NOTE — ED Triage Note (Signed)
Pt presents to ED with c/o sudden onset of epigastric/chest pain that began 1/2 hour PTA.  Pt states she then began with diffuse abdominal pain and SOB.  Pt states she is having a panic attack.  Pt has been to ED 3 other times this week.

## 2018-05-13 NOTE — Telephone Encounter (Signed)
Regarding: Difficulty breathing   ----- Message from [facility] sent at 05/13/2018  9:59 AM EDT -----  Samantha Cameron [id number], 62 year old, female    Calls today:  Clinical Questions (NON-SICK CLINICAL QUESTIONS ONLY)      Specific nature of request pt is requesting call back difficulty breathing, pt was at ED yesterday  thank you   Return phone number [phone]  Person calling on behalf of patient: Patient (self)      Patient's language of care: English    Patient does not need an interpreter.    Patient's PCP: Suann Larry. Simons,MD

## 2018-05-13 NOTE — Telephone Encounter (Signed)
Will need to discuss at visit tomorrow

## 2018-05-14 ENCOUNTER — Telehealth (HOSPITAL_BASED_OUTPATIENT_CLINIC_OR_DEPARTMENT_OTHER): Payer: Self-pay | Admitting: Registered Nurse

## 2018-05-14 ENCOUNTER — Encounter (HOSPITAL_BASED_OUTPATIENT_CLINIC_OR_DEPARTMENT_OTHER): Payer: Self-pay | Admitting: Internal Medicine

## 2018-05-14 ENCOUNTER — Ambulatory Visit (HOSPITAL_BASED_OUTPATIENT_CLINIC_OR_DEPARTMENT_OTHER): Payer: Medicare Other | Admitting: Internal Medicine

## 2018-05-14 VITALS — BP 108/80 | HR 84 | Ht 60.83 in | Wt 235.0 lb

## 2018-05-14 DIAGNOSIS — F411 Generalized anxiety disorder: Secondary | ICD-10-CM | POA: Diagnosis not present

## 2018-05-14 DIAGNOSIS — J309 Allergic rhinitis, unspecified: Secondary | ICD-10-CM

## 2018-05-14 DIAGNOSIS — E1129 Type 2 diabetes mellitus with other diabetic kidney complication: Secondary | ICD-10-CM

## 2018-05-14 DIAGNOSIS — K2970 Gastritis, unspecified, without bleeding: Secondary | ICD-10-CM

## 2018-05-14 DIAGNOSIS — K219 Gastro-esophageal reflux disease without esophagitis: Secondary | ICD-10-CM | POA: Diagnosis not present

## 2018-05-14 DIAGNOSIS — F17201 Nicotine dependence, unspecified, in remission: Secondary | ICD-10-CM | POA: Diagnosis not present

## 2018-05-14 LAB — EKG

## 2018-05-14 LAB — THYROID SCREEN TSH REFLEX FT4: THYROID SCREEN TSH REFLEX FT4: 1.99 u[IU]/mL (ref 0.358–3.740)

## 2018-05-14 MED ORDER — LIDOCAINE VISCOUS 2 % MT SOLN
10.00 mL | Freq: Once | OROMUCOSAL | Status: AC
Start: 2018-05-14 — End: 2018-05-14
  Administered 2018-05-14: 10 mL via OROMUCOSAL
  Filled 2018-05-14: qty 15

## 2018-05-14 MED ORDER — FLUTICASONE PROPIONATE 50 MCG/ACT NA SUSP: 1 | Bottle | Freq: Every day | NASAL | 11 refills | 0 days | Status: AC

## 2018-05-14 MED ORDER — ALUMINUM & MAGNESIUM HYDROXIDE 200-200 MG/5ML PO SUSP: 30 mL | mL | Freq: Four times a day (QID) | ORAL | 1 refills | 0 days | Status: DC | PRN

## 2018-05-14 MED ORDER — SUCRALFATE 1 GM/10ML PO SUSP: 1 g | mL | Freq: Four times a day (QID) | ORAL | 1 refills | 0 days | Status: DC

## 2018-05-14 MED ORDER — LIDOCAINE VISCOUS 2 % MT SOLN: 10 mL | mL | Freq: Four times a day (QID) | 0 refills | 0 days | Status: DC | PRN

## 2018-05-14 MED ORDER — FLUTICASONE PROPIONATE 50 MCG/ACT NA SUSP
1.00 | Freq: Every day | NASAL | 11 refills | Status: AC
Start: 2018-05-14 — End: 2019-05-14

## 2018-05-14 MED ORDER — SUCRALFATE 1 GM/10ML PO SUSP
1.0000 g | Freq: Four times a day (QID) | ORAL | 1 refills | Status: DC
Start: 2018-05-14 — End: 2018-07-16

## 2018-05-14 MED ORDER — SUCRALFATE 1 GM/10ML PO SUSP
1.00 g | Freq: Once | ORAL | Status: AC
Start: 2018-05-14 — End: 2018-05-14
  Administered 2018-05-14: 1 g via ORAL
  Filled 2018-05-14: qty 10

## 2018-05-14 MED ORDER — LIDOCAINE VISCOUS 2 % MT SOLN
10.0000 mL | Freq: Four times a day (QID) | OROMUCOSAL | 0 refills | Status: DC | PRN
Start: 2018-05-14 — End: 2018-05-20

## 2018-05-14 MED ORDER — ALUMINUM & MAGNESIUM HYDROXIDE 200-200 MG/5ML PO SUSP
30.0000 mL | Freq: Four times a day (QID) | ORAL | 1 refills | Status: DC | PRN
Start: 2018-05-14 — End: 2018-05-26

## 2018-05-14 MED ORDER — ALUMINUM & MAGNESIUM HYDROXIDE 200-200 MG/5ML PO SUSP
30.00 mL | Freq: Once | ORAL | Status: AC
Start: 2018-05-14 — End: 2018-05-14
  Administered 2018-05-14: 30 mL via ORAL
  Filled 2018-05-14: qty 30

## 2018-05-14 MED ORDER — RANITIDINE HCL 150 MG PO TABS: 150 mg | tablet | Freq: Two times a day (BID) | ORAL | 2 refills | 0 days | Status: DC

## 2018-05-14 MED ORDER — RANITIDINE HCL 150 MG PO TABS
150.0000 mg | ORAL_TABLET | Freq: Two times a day (BID) | ORAL | 2 refills | Status: DC
Start: 2018-05-14 — End: 2018-05-19

## 2018-05-14 NOTE — Discharge Instructions (Signed)
Follow up with your primary doctor and with GI, and return to the emergency department for new or worsening symptoms.

## 2018-05-14 NOTE — ED Provider Notes (Signed)
eMERGENCY dEPARTMENT Physician Attending NOTE    The ED nursing record was reviewed.   The prior medical records as available electronically through Epic were reviewed.  The mode of arrival was Self on 05/13/18.     HPI    Samantha Cameron is a 62 year old female seen in the emergency department with concern for burning from her epigastric area to her chest, which has been a problem for the patient over approximately the past 2 months.  Patient states that she tried to eat 2 slices of toast earlier in the day, with minimal improvement of symptoms.  Patient states that she has been taking omeprazole and Carafate, with minimal improvement of symptoms.  Patient states that she has been having difficulty with acid reflux since May.  Patient notes difficulty breathing with a "burning" pain.  Review of record shows the patient has had multiple visits to the emergency department recently with similar complaint.    Pt notes no fever, cough, N/V/D, changes in urination, patient stool, weakness.    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) ([place])  09/14/2015: Cough  No date: Depression      Comment:  per steward records  No date: Diabetes  No date: Disorders of lipoid metabolism  No date: Esophageal reflux  No date: External hemorrhoid      Comment:  per steward  records  No date: HTN (hypertension)  02/01/2012: Hypoxia  02/16/2017: Left ear pain  06/07/2015: Left genital labial abscess  06/16/2015: Obesity, Class III, 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y) ([place])  08/16/2015: Osteopenia      Comment:  On xray of left knee per steward records 06/2014 Unclear                if addressed  09/14/2015: Right ear impacted cerumen  08/15/2015: Type 2 diabetes mellitus without complication, without   long-term current use of insulin (HCC)      Comment:   HEMOGLOBIN A1C (%) Date Value 07/15/2015 6.2 (H)                ----------  Steward record - a1c 6.2 on 01/31/15 6.4 on                09/30/14 5.9 on 12/29/13 6.4 08/18/13     PROBLEM LIST  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) ([place])     Tobacco use disorder     Spinal stenosis of lumbar region     HLD (hyperlipidemia)     Chronic  bilateral low back pain with bilateral sciatica     Skin change     Pineal gland cyst     History of vitamin D deficiency     Hemorrhoids     Venous (peripheral) insufficiency     Adrenal adenoma     Type 2 diabetes mellitus with microalbuminuria, without long-term current use of insulin (HCC)     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 (HCC)     Acute pain of right wrist     Abnormal PFTs     Screening for malignant neoplasm of breast     Skin irritation     H/O bone density study     Vertigo     Orthostatic hypotension     Social problem     Right arm weakness     Insomnia     Acute pain of left knee     Hx of total knee replacement, left     Acute bacterial sinusitis     Seasonal allergic rhinitis     Left foot pain     Stress due to family tension     Gastritis without bleeding     Leucocytosis     Viral URI with cough     Gastroesophageal reflux disease without esophagitis      HOSP-TO-HOME COMMUNITY COLLABORATION PROGRAM      SURGICAL HISTORY    Past Surgical History:  No date: ANES NERVE MUSC TENDON FASCIA&BURSA KNEE&/POPLT  No date: FOOT  SURGERY      Comment:  bilateral  No date: LAPAROSCOPY SURG CHOLECYSTECTOMY  No date: OB ANTEPARTUM CARE CESAREAN DLVR & POSTPARTUM      Comment:  x3  No date: TONSILLECTOMY & ADENOIDECTOMY <AGE 31  04/13/2009: TOTAL KNEE REPLACEMENT      Comment:  left  08/11/2009: WRIST GANGLION EXCISION      Comment:  left     CURRENT MEDICATIONS      Current Facility-Administered Medications:     aluminum-magnesium hydroxide (MAALOX) 200-200 mg/5 mL suspension 30 mL, 30 mL, Oral, Once, Richrd Prime, MD    lidocaine (XYLOCAINE) 2 % viscous solution 10 mL, 10 mL, Mouth/Throat, Once, Richrd Prime, MD    sucralfate (CARAFATE) 1 GM/10ML suspension 1 g, 1 g, Oral, Once, Richrd Prime, MD    Current Outpatient Medications:     Omeprazole 20 MG TBEC, Take 1 tablet by mouth 2 (two) times daily Take 30 minutes before breakfast, Disp: 30 tablet, Rfl: 1    sucralfate (CARAFATE) 1 GM tablet, Take 1 tablet by mouth 3 (three) times daily for 14 days, Disp: 42 tablet, Rfl: 0    metoprolol (TOPROL-XL) 25 MG 24 hr tablet, Take 1 tablet by mouth daily, Disp: 90 tablet, Rfl: 1    lisinopril (PRINIVIL,ZESTRIL) 20 MG tablet, take 1 tablet by mouth once daily, Disp: 90 tablet, Rfl: 3    metFORMIN (GLUCOPHAGE-XR) 500 MG 24 hr tablet, take 4 tablets by mouth once daily WITH BREAKFAST, Disp: 360 tablet, Rfl: 3    atorvastatin (LIPITOR) 20 MG tablet, take 1 tablet by mouth once daily, Disp: 90 tablet, Rfl: 3    Omega-3 Fatty Acids (FISH OIL) 1000 MG CAPS, Take by mouth, Disp: , Rfl:     VOLTAREN 1 % GEL gel, apply 4 grams to affected area twice a day DO NOT EXCEED 32 G IN 1 DAY, Disp: 100 g, Rfl: 0    hydrOXYzine (ATARAX) 10 MG tablet, take 1-2 tablets  by mouth once daily if needed for anxiety, Disp: 15 tablet, Rfl: 0    albuterol (PROVENTIL) (2.5 MG/3ML) 0.083% nebulizer solution, Take 3 mLs by nebulization every 6 (six) hours as needed for Wheezing, Disp: 60 mL, Rfl: 1    ONETOUCH DELICA LANCETS FINE MISC, TEST once daily, Disp:  100 each, Rfl: 11    ONETOUCH VERIO test strip, TEST once daily, Disp: 100 strip, Rfl: 11    albuterol (PROVENTIL HFA,VENTOLIN HFA, PROAIR HFA) 108 (90 Base) MCG/ACT inhaler, Inhale 2 puffs into the lungs every 6 (six) hours as needed for Wheezing or Shortness of breath, Disp: 1 Inhaler, Rfl: 11    cholecalciferol (VITAMIN D3) 1000 UNIT tablet, Take 1 tablet by mouth daily, Disp: , Rfl:     pregabalin (LYRICA) 100 MG capsule, Take 100 mg by mouth 2 (two) times daily With Dr Dwyane Dee, Disp: , Rfl:     oxycodone-acetaminophen (PERCOCET) 5-325 MG per tablet, Take 1 tablet by mouth 2 (two) times daily as needed With Dr Dwyane Dee, Disp: , Rfl:     aspirin 81 MG tablet, Take 81 mg by mouth daily., Disp: , Rfl:     ALLERGIES    Review of Patient's Allergies indicates:   Codeine camsylate       Shortness of Breath, Other (See                            Comments)    Comment:Chest pain   Motrin [ibuprofen]      Nausea Only   Sulfa antibiotics       Other (See Comments)   Bactrim                 Rash, Itching    FAMILY HISTORY    Review of patient's family history indicates:  Problem: Diabetes      Relation: Mother          Age of Onset: (Not Specified)  Problem: Heart      Relation: Brother          Age of Onset: (Not Specified)          Comment: CAD and AAA  Problem: Hypertension      Relation: Mother          Age of Onset: (Not Specified)  Problem: Arthritis      Relation: Mother          Age of Onset: (Not Specified)  Problem: Stroke      Relation: Mother          Age of Onset: (Not Specified)  Problem: Diabetes      Relation: Brother          Age of Onset: (Not Specified)          Comment: same brother  Problem: Cancer - Other      Relation: Father          Age of Onset: (Not Specified)          Comment: type uncertain  Problem: Thyroid      Relation: Sister          Age of Onset: (Not Specified)  Problem: Alcohol/Drug Abuse      Relation: Son          Age of Onset: (Not Specified)  Problem: OTHER      Relation: Son           Age of Onset: (Not Specified)  Comment: ?brain aneurysm per steward records  Problem: No Known Problems      Relation: Grandchild          Age of Onset: (Not Specified)          Comment: 6  Problem: Cancer - Breast      Relation: FamHxNeg          Age of Onset: (Not Specified)  Problem: Cancer - Cervical      Relation: FamHxNeg          Age of Onset: (Not Specified)  Problem: Cancer - Ovarian      Relation: FamHxNeg          Age of Onset: (Not Specified)  Problem: Cancer - Colon      Relation: FamHxNeg          Age of Onset: (Not Specified)  Problem: Psychiatric Illness      Relation: FamHxNeg          Age of Onset: (Not Specified)      SOCIAL HISTORY    Social History     Socioeconomic History    Marital status: Divorced     Spouse name: Not on file    Number of children: Not on file    Years of education: Not on file    Highest education level: Not on file   Occupational History    Not on file   Social Needs    Financial resource strain: Not on file    Food insecurity:     Worry: Not on file     Inability: Not on file    Transportation needs:     Medical: Not on file     Non-medical: Not on file   Tobacco Use    Smoking status: Former Smoker     Packs/day: 0.50     Years: 31.00     Pack years: 15.50     Types: Cigarettes     Last attempt to quit: 03/10/2018     Years since quitting: 0.1    Smokeless tobacco: Never Used    Tobacco comment: quit smoking inform provided to patient   Substance and Sexual Activity    Alcohol use: No    Drug use: No    Sexual activity: Not Currently     Partners: Male     Birth control/protection: Tubal Ligation   Lifestyle    Physical activity:     Days per week: Not on file     Minutes per session: Not on file    Stress: Not on file   Relationships    Social connections:     Talks on phone: Not on file     Gets together: Not on file     Attends religious service: Not on file     Active member of club or organization: Not on file     Attends meetings of clubs  or organizations: Not on file     Relationship status: Not on file    Intimate partner violence:     Fear of current or ex partner: Not on file     Emotionally abused: Not on file     Physically abused: Not on file     Forced sexual activity: Not on file   Other Topics Concern    Not on file   Social History Narrative    Lives with son    1 daughter and another son passed away    Disabled from back  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 Simons,MD, 07/15/2015, 2:56 PM            REVIEW OF SYSTEMS    The pertinent positives are reviewed in the HPI above. All other systems were reviewed and are negative.    PHYSICAL EXAM      Vital Signs: BP 125/62  Pulse 90  Temp 97.7 F  Resp 18  Wt 108.9 kg (240 lb)  LMP 10/22/2007  SpO2 94%  BMI 45.61 kg/m2     Constitutional: Well-developed, Speaking full sentences.    HEAD: Normocephalic/atraumatic  NECK: Supple with no meningismus.   EYES: Normal lids/lashes. No scleral icterus.   ENT: External ears normal. Nose normal.  CV: Normal rate, regular rhythm.  No murmurs, rubs, or gallops.  PULMONARY: Clear to auscultation bilaterally  ABDOMINAL: Soft, nontender, nondistended  MUSCULOSKELETAL : Moving all 4 extremities. No deformities.    SKIN: Warm and dry, no rash . The skin color and turgor are normal.     NEUROLOGIC: Normal mental status. Cranial nerves, motor grossly intact.     PSYCHIATRIC: Normal affect      RESULTS  No results found for this visit on 05/13/18 (from the past 24 hour(s)).     EKG:  Normal sinus rhythm at 89, left axis deviation, PR 172, QRS 102, QTc 457, no ST-T wave changes, no evidence of acute ischemia    MEDICATIONS ADMINISTERED ON THIS VISIT  Orders Placed This Encounter      aluminum-magnesium hydroxide (MAALOX) 200-200 mg/5 mL suspension  30 mL      lidocaine (XYLOCAINE) 2 % viscous solution 10 mL      sucralfate (CARAFATE) 1 GM/10ML suspension 1 g      ED COURSE & MEDICAL DECISION MAKING      I reviewed the patient's past medical history/problem list, past surgical history, medication list, social history and allergies.    ED Decision Making & Course: Pt is a 62 year old female seen in the emergency department with concern for heartburn/apparent acid reflux.  EKG noted.  Patient was treated with medication for symptom control, with good effect.  Evaluation is not consistent with ACS. Plan to have patient follow up with PCP/GI.  Patient stable for discharge home.    Follow Up: PCP/GI      CONDITION AT DISCHARGE  Stable    DISCHARGE DIAGNOSIS  Heartburn  (primary encounter diagnosis)    Electronically signed by:   Richrd Prime, MD  Dept.of Emergency [place]

## 2018-05-14 NOTE — Narrator Note (Signed)
Pt reports that she is feeling better. VSS  Pt educated on diet for her on-going stomach issues.  Pt states that she has a follow-up apt with PCP tomorrow.

## 2018-05-14 NOTE — Narrator Note (Signed)
Patient Disposition    Patient education for diagnosis, medications, activity, diet and follow-up.  Patient left ED 2:50 AM.  Patient rep received written instructions.  Interpreter to provide instructions: No    Patient belongings with patient: YES    Have all existing LDAs been addressed? N/A    Have all IV infusions been stopped? N/A    Discharged to: Discharged to home    Pt ambulated with steady gait out of ED

## 2018-05-14 NOTE — Progress Notes (Signed)
Rn left message on pt vm to call [facility] back.         [facility]   Phone Number: [phone]            [place] [id number], 62 year old, female, Telephone Information:   Home Phone   [phone]   Work Phone   Not on file.   Mobile     [phone]       Patient's Preferred Pharmacy:     [facility] - [address], [place]   Phone: [phone] Fax: [phone]       CONFIRMED TODAY: [name] NUMBER: [phone]     Patient's language of care: English     Patient does not need an interpreter.     Patient's PCP: Suann Larry. Simons,MD     Person calling on behalf of patient: Patient (self)     Calls today to speak to nurse only.   PATIENT HAS QUESTION REGARDING MEDICATION THAT WAS PRESCRIBED TODAY. PLEASE GIVE HER A CALL. [place]

## 2018-05-15 LAB — HEMOGLOBIN A1C
ESTIMATED AVERAGE GLUCOSE: 134 (ref 74–160)
HEMOGLOBIN A1C: 6.3 % — ABNORMAL HIGH (ref 4.0–5.6)

## 2018-05-15 LAB — EKG

## 2018-05-15 NOTE — Progress Notes (Signed)
Samantha Cameron is a 62 year old female here for follow-up hospital/ED - GERD    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) ([place])     Tobacco use disorder     Spinal stenosis of lumbar region     HLD (hyperlipidemia)     Chronic bilateral low back pain with bilateral sciatica     Skin change     Pineal gland cyst     History of vitamin D deficiency     Hemorrhoids     Venous (peripheral) insufficiency     Adrenal adenoma     Type 2 diabetes mellitus with microalbuminuria, without long-term current use of insulin ([place])     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 (HCC)     Acute pain of right wrist     Abnormal PFTs     Screening for malignant neoplasm of breast     Skin irritation     H/O bone density study     Vertigo     Orthostatic hypotension     Social problem     Right arm weakness     Insomnia     Acute pain of left knee     Hx of total knee replacement, left     Acute bacterial sinusitis     Seasonal allergic rhinitis     Left foot pain     Stress due to family tension     Gastritis without bleeding     Leucocytosis     Viral URI with cough     Gastroesophageal reflux disease without esophagitis      HOSP-TO-HOME COMMUNITY COLLABORATION PROGRAM    Multiple ED visits for GERD and anxiety  Starts to get upper abdominal pain and chest pain from GERD - panics  Resolves with GI cocktail in ED  Has not been able to take sucralfate pills - needs liquid  GI appt not yet set up - called office during visit - not urgent slot available until end of July - encouraged to message GI doctors   Patient's last EGD in 2008 showing gastritis, hiatal hernia    Has stopped tobacco as of 5/10    Most Recent BP Reading(s)  05/14/18 : 108/80  05/14/18 : 123/66  05/13/18 : 102/60  05/12/18 : 102/67  05/10/18 : 121/83    Most Recent Weight Reading(s)  05/14/18 : 106.6 kg (235 lb)  05/13/18 : 108.9 kg (240 lb)  05/13/18 : 108.9 kg (240 lb)  05/11/18 : 108.9 kg  (240 lb)  05/09/18 : 110.3 kg (243 lb 2.7 oz)    PE:  BP 108/80  Pulse 84  Ht 5' 0.83" (1.545 m)  Wt 106.6 kg (235 lb)  LMP 10/22/2007  SpO2 98%  BMI 44.66 kg/m2  Estimated body mass index is 44.66 kg/m as calculated from the following:    Height as of this encounter: 5' 0.83" (1.545 m).    Weight as of this encounter: 106.6 kg (235 lb).  Gen - Morbidly obese female upright in chair, anxious at times  HEENT - MMM  CV - RRR, no m/r/g  Resp - CTAB  Abd - +BS, soft, ND, epigastric TTP, no rebound or guarding  Ext - Warm, dry. Feet sensation intact by monofilament  Neuro - A+Ox3  Psych - Appropriate    A/P:  (K21.9) Gastroesophageal reflux disease without esophagitis  (primary encounter diagnosis)  (K29.70) Gastritis without  bleeding, unspecified chronicity, unspecified gastritis type  Comment: Multiple ED visits, anxiety over related CP, abd pain.  Unable to obtain urgent GI appt by phone - will message GI team.  Add H2 blocker to PPI, add maalox and viscous lido for patient to make own GI cocktail.  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)  Comment: Controlled, due for labs  HEMOGLOBIN A1C (%)   Date Value   10/28/2017 6.1 (H)   04/10/2017 6.0 (H)   01/04/2017 6.1 (H)     No results found for: POCA1C    Plan: COLLECTION VENOUS BLOOD VENIPUNCTURE,         HEMOGLOBIN A1C         (E66.01) 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 (HCC)  Comment: By BMI  Plan: Discussed portion control,  healthy food choice, exercise    (F17.201) Tobacco dependence in remission  Comment: Discussed, congratulated  Plan: Encouraged continued abstinence    (J30.9) Allergic rhinitis, unspecified seasonality, unspecified trigger  Comment: refill   Plan: fluticasone (FLONASE) 50 MCG/ACT nasal spray

## 2018-05-16 LAB — EKG

## 2018-05-19 ENCOUNTER — Encounter (HOSPITAL_BASED_OUTPATIENT_CLINIC_OR_DEPARTMENT_OTHER): Payer: Self-pay | Admitting: Gastroenterology

## 2018-05-19 ENCOUNTER — Ambulatory Visit: Payer: Medicare Other | Attending: Internal Medicine | Admitting: Gastroenterology

## 2018-05-19 ENCOUNTER — Emergency Department
Admission: AD | Admit: 2018-05-19 | Discharge: 2018-05-20 | Disposition: A | Discharge status: Home / Self Care | Payer: Medicare Other | Attending: Emergency Medicine | Admitting: Emergency Medicine

## 2018-05-19 ENCOUNTER — Encounter (HOSPITAL_BASED_OUTPATIENT_CLINIC_OR_DEPARTMENT_OTHER): Payer: Self-pay | Admitting: OAS-Outpatient Addiction Svcs(PRV Practice 40)

## 2018-05-19 VITALS — BP 128/62 | HR 74 | Temp 97.6°F | Wt 229.0 lb

## 2018-05-19 DIAGNOSIS — Z882 Allergy status to sulfonamides status: Secondary | ICD-10-CM | POA: Diagnosis not present

## 2018-05-19 DIAGNOSIS — E785 Hyperlipidemia, unspecified: Secondary | ICD-10-CM | POA: Diagnosis not present

## 2018-05-19 DIAGNOSIS — R1013 Epigastric pain: Secondary | ICD-10-CM | POA: Diagnosis not present

## 2018-05-19 DIAGNOSIS — A048 Other specified bacterial intestinal infections: Secondary | ICD-10-CM

## 2018-05-19 DIAGNOSIS — R0602 Shortness of breath: Secondary | ICD-10-CM

## 2018-05-19 DIAGNOSIS — R634 Abnormal weight loss: Secondary | ICD-10-CM | POA: Diagnosis present

## 2018-05-19 DIAGNOSIS — E119 Type 2 diabetes mellitus without complications: Secondary | ICD-10-CM | POA: Diagnosis not present

## 2018-05-19 DIAGNOSIS — K219 Gastro-esophageal reflux disease without esophagitis: Secondary | ICD-10-CM | POA: Diagnosis present

## 2018-05-19 DIAGNOSIS — Z881 Allergy status to other antibiotic agents status: Secondary | ICD-10-CM | POA: Diagnosis not present

## 2018-05-19 DIAGNOSIS — I10 Essential (primary) hypertension: Secondary | ICD-10-CM | POA: Diagnosis present

## 2018-05-19 DIAGNOSIS — Z87891 Personal history of nicotine dependence: Secondary | ICD-10-CM

## 2018-05-19 DIAGNOSIS — R1084 Generalized abdominal pain: Secondary | ICD-10-CM | POA: Diagnosis present

## 2018-05-19 DIAGNOSIS — E669 Obesity, unspecified: Secondary | ICD-10-CM | POA: Diagnosis not present

## 2018-05-19 DIAGNOSIS — F419 Anxiety disorder, unspecified: Secondary | ICD-10-CM | POA: Diagnosis present

## 2018-05-19 DIAGNOSIS — Z885 Allergy status to narcotic agent status: Secondary | ICD-10-CM

## 2018-05-19 DIAGNOSIS — R12 Heartburn: Secondary | ICD-10-CM | POA: Diagnosis not present

## 2018-05-19 MED ORDER — OMEPRAZOLE 40 MG PO CPDR
DELAYED_RELEASE_CAPSULE | ORAL | 1 refills | Status: DC
Start: 2018-05-19 — End: 2018-06-14

## 2018-05-19 MED ORDER — FAMOTIDINE 20 MG PO TABS
20.0000 mg | ORAL_TABLET | Freq: Once | ORAL | Status: AC
Start: 2018-05-20 — End: 2018-05-20
  Administered 2018-05-20: 20 mg via ORAL
  Filled 2018-05-19: qty 1

## 2018-05-19 MED ORDER — OMEPRAZOLE 40 MG PO CPDR: capsule | ORAL | 1 refills | 0 days | Status: DC

## 2018-05-19 MED ORDER — ALUMINUM & MAGNESIUM HYDROXIDE 200-200 MG/5ML PO SUSP
30.0000 mL | Freq: Once | ORAL | Status: AC
Start: 2018-05-20 — End: 2018-05-20
  Administered 2018-05-20: 30 mL via ORAL
  Filled 2018-05-19: qty 30

## 2018-05-19 MED ORDER — LIDOCAINE VISCOUS 2 % MT SOLN
10.0000 mL | Freq: Once | OROMUCOSAL | Status: AC
Start: 2018-05-20 — End: 2018-05-20
  Administered 2018-05-20: 10 mL via OROMUCOSAL
  Filled 2018-05-19: qty 15

## 2018-05-19 MED ORDER — RANITIDINE HCL 75 MG PO TABS
ORAL_TABLET | ORAL | 2 refills | Status: DC
Start: 2018-05-19 — End: 2018-07-11

## 2018-05-19 MED ORDER — RANITIDINE HCL 75 MG PO TABS: tablet | ORAL | 2 refills | 0 days | Status: DC

## 2018-05-19 NOTE — ED Triage Note (Signed)
Pt presents with having something stuck in throat. Pt is here for the same symptoms on a continual basis. Pts EKG was done and shown to MD. Pts VSS.

## 2018-05-19 NOTE — Progress Notes (Signed)
05/19/18    Pt is a 62F who presents with severe GERD. Symptoms initially began 03/26/18, and she woke up feeling SOB. She proceeded to the ER for evaluation. Pt has had multiple ER presentations and an admission at the end of June for these symptoms. She admits to severe acid reflux that causes her to have a pani attack and be SOB. She has severe epigastric pain described as burning. Also a/w nausea and "spitting up brown chunks". She has lost 14 lbs since 6/21. She has lost 21 lbs since 5/7. She is afraid to eat d/t symptoms. She reports dysphagia to pills - otherwise no dysphagia. Denies vomiting, hematochezia, melena, change in bowel habits. She is now taking omeprazole 20mg  PO BID, ranitidine 150mg  PO BID, lidocaine suspension, Carafate tablets (liquid not approved by insurance) and Maalox. GI cocktail helps the pain but burning persists.    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) ([place])     Tobacco use disorder     Spinal stenosis of lumbar region     HLD (hyperlipidemia)     Chronic bilateral low back pain with bilateral sciatica     Skin change     Pineal gland cyst     History of vitamin D deficiency     Hemorrhoids     Venous (peripheral) insufficiency     Adrenal adenoma     Type 2 diabetes mellitus with microalbuminuria, without long-term current use of insulin (HCC)     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 (HCC)     Acute pain of right wrist     Abnormal PFTs     Screening for malignant neoplasm of breast     Skin irritation     H/O bone density study     Vertigo     Orthostatic hypotension     Social problem     Right arm weakness     Insomnia     Acute pain of left knee     Hx of total knee replacement, left     Acute bacterial sinusitis     Seasonal allergic rhinitis     Left foot pain     Stress due to family tension     Gastritis without bleeding     Leucocytosis     Viral URI with cough      Gastroesophageal reflux disease without esophagitis      HOSP-TO-HOME COMMUNITY COLLABORATION PROGRAM      Past Surgical History:  No date: ANES NERVE MUSC TENDON FASCIA&BURSA KNEE&/POPLT  No date: FOOT SURGERY      Comment:  bilateral  No date: LAPAROSCOPY SURG CHOLECYSTECTOMY  No date: OB ANTEPARTUM CARE CESAREAN DLVR & POSTPARTUM      Comment:  x3  No date: TONSILLECTOMY & ADENOIDECTOMY <AGE 56  04/13/2009: TOTAL KNEE REPLACEMENT      Comment:  left  08/11/2009: WRIST GANGLION EXCISION      Comment:  left     Review of Patient's Allergies indicates:   Codeine camsylate       Shortness of Breath, Other (See                            Comments)    Comment:Chest pain   Motrin [ibuprofen]      Nausea Only   Sulfa antibiotics  Other (See Comments)   Bactrim                 Rash, Itching    Social History    Tobacco Use      Smoking status: Former Smoker        Packs/day: 0.50        Years: 31.00        Pack years: 15.5        Types: Cigarettes        Quit date: 03/10/2018        Years since quitting: 0.1      Smokeless tobacco: Never Used      Tobacco comment: quit smoking inform provided to patient    Alcohol use: No    Drug use: No      Current Outpatient Medications   Medication Sig    raNITIdine (ZANTAC) 150 MG tablet Take 1 tablet by mouth 2 (two) times daily    aluminum-magnesium hydroxide (MAALOX) 200-200 mg/5 mL suspension Take 30 mLs by mouth every 6 (six) hours as needed for Indigestion    lidocaine (XYLOCAINE) 2 % solution Take 10 mLs by mouth every 6 (six) hours as needed for Pain    sucralfate (CARAFATE) 1 GM/10ML suspension Take 10 mLs by mouth 4 (four) times daily    fluticasone (FLONASE) 50 MCG/ACT nasal spray 1 spray by Each Nostril route daily    Omeprazole 20 MG TBEC Take 1 tablet by mouth 2 (two) times daily Take 30 minutes before breakfast    VOLTAREN 1 % GEL gel apply 4 grams to affected area twice a day DO NOT EXCEED 32 G IN 1 DAY    albuterol (PROVENTIL) (2.5 MG/3ML) 0.083%  nebulizer solution Take 3 mLs by nebulization every 6 (six) hours as needed for Wheezing    metoprolol (TOPROL-XL) 25 MG 24 hr tablet Take 1 tablet by mouth daily    lisinopril (PRINIVIL,ZESTRIL) 20 MG tablet take 1 tablet by mouth once daily    ONETOUCH DELICA LANCETS FINE MISC TEST once daily    metFORMIN (GLUCOPHAGE-XR) 500 MG 24 hr tablet take 4 tablets by mouth once daily WITH BREAKFAST    atorvastatin (LIPITOR) 20 MG tablet take 1 tablet by mouth once daily    ONETOUCH VERIO test strip TEST once daily    albuterol (PROVENTIL HFA,VENTOLIN HFA, PROAIR HFA) 108 (90 Base) MCG/ACT inhaler Inhale 2 puffs into the lungs every 6 (six) hours as needed for Wheezing or Shortness of breath    Omega-3 Fatty Acids (FISH OIL) 1000 MG CAPS Take by mouth    cholecalciferol (VITAMIN D3) 1000 UNIT tablet Take 1 tablet by mouth daily    pregabalin (LYRICA) 100 MG capsule Take 100 mg by mouth 2 (two) times daily With Dr Dwyane Dee    oxycodone-acetaminophen (PERCOCET) 5-325 MG per tablet Take 1 tablet by mouth 2 (two) times daily as needed With Dr Dwyane Dee    aspirin 81 MG tablet Take 81 mg by mouth daily.     No current facility-administered medications for this visit.        Review of patient's family history indicates:  Problem: Diabetes      Relation: Mother          Age of Onset: (Not Specified)  Problem: Heart      Relation: Brother          Age of Onset: (Not Specified)          Comment: CAD and AAA  Problem: Hypertension      Relation: Mother          Age of Onset: (Not Specified)  Problem: Arthritis      Relation: Mother          Age of Onset: (Not Specified)  Problem: Stroke      Relation: Mother          Age of Onset: (Not Specified)  Problem: Diabetes      Relation: Brother          Age of Onset: (Not Specified)          Comment: same brother  Problem: Cancer - Other      Relation: Father          Age of Onset: (Not Specified)          Comment: type uncertain  Problem: Thyroid      Relation: Sister          Age of  Onset: (Not Specified)  Problem: Alcohol/Drug Abuse      Relation: Son          Age of Onset: (Not Specified)  Problem: OTHER      Relation: Son          Age of Onset: (Not Specified)          Comment: ?brain aneurysm per steward records  Problem: No Known Problems      Relation: Grandchild          Age of Onset: (Not Specified)          Comment: 6  Problem: Cancer - Breast      Relation: FamHxNeg          Age of Onset: (Not Specified)  Problem: Cancer - Cervical      Relation: FamHxNeg          Age of Onset: (Not Specified)  Problem: Cancer - Ovarian      Relation: FamHxNeg          Age of Onset: (Not Specified)  Problem: Cancer - Colon      Relation: FamHxNeg          Age of Onset: (Not Specified)  Problem: Psychiatric Illness      Relation: FamHxNeg          Age of Onset: (Not Specified)      REVIEW OF SYSTEMS:    Cardiovascular:  No c/o chest pain or palpitations.  Pulmonary:  No c/o shortness of breath, coughing or current wheezing   Neuro:  No c/o weakness, numbness, falls   Endocrine:  No c/o polyuria/polydipsia, no heat/cold intolerance  Immunology:  No c/o fever, chills.    GU:  No c/o hematuria or dysuria  Heme/Onc:  No c/o problems with bleeding or bruising.   Derm:  No c/o rashes or significant skin changes.    Psych: No anxiety or depression  Musculoskeletal: No significant arthralgias    Physical Exam:  Vital Signs: BP 128/62 (Site: LA, Position: Sitting, Cuff Size: Lrg)  Pulse 74  Temp 97.6 F (36.4 C) (Temporal)  Wt 103.9 kg (229 lb)  LMP 10/22/2007  SpO2 97%  BMI 43.52 kg/m2 Body mass index is 43.52 kg/m.  General: Well-appearing, NAD.   HEENT: Normocephalic, atraumatic. Tongue midline. No scleral icterus.  LN: Without cervical, superclavicular or infraclavicular lymphadenopathy.  Pulmonary: Clear to auscultation. No rales, wheezes or ronchi.  Cardiovascular: S1, S2, no M/R/G.    Abdominal: Soft, non-distended, non-tender throughout, bowel sounds normal, no  appreciable  hepatosplenomegaly  Extremities: No clubbing, cyanosis, erythema or edema.   Neurological: CN II-XII grossly intact. Normal gait. AAO x 3  Psych: Normal affect and mood.   Derm: No jaundice or rashes on exposed areas.    Pertinent Labs/Imaging/Procedures:  Colonoscopy 2014:  Post Procedure Diagnosis:       - One, non-bleeding polyp in the transverse colon. Resected and retrieved.       - One 3 mm polyp in the transverse colon. Resected and retrieved.       - Non-bleeding internal hemorrhoids.       - The examination was otherwise normal.    EGD 2008:  Post Procedure Diagnosis:  - Hiatus hernia.  - Gastritis.  Pathology: H.pylori positive    Assessment:  Pt is 11F with PMH COPD, morbid obesity, H.pylori (2009, unclear if treated) who p/w severe GERD symptoms, unintentional weight loss (~20 lbs since 03/2018) and abdominal pain.    1. GERD - severe  2. Unintentional weight loss  3. Abdominal pain      Plan:  1. Recommend EGD. This procedure has been fully reviewed with the patient and written informed consent has been obtained.  2. Increase omeprazole to 40mg  PO BID.   3. Pt aware that she is due for colonoscopy this year. Concern about her ability to tolerate prep. She will call if she changes her mind and feels she can tolerate the prep without severe symptoms as she is currently experiencing.  4. CT A/P with pancreatic protocol  5. Follow-up after CT and EGD      All questions answered at the time of the office visit.  Pt understands and agrees with plan. Pt will call with questions/concerns.

## 2018-05-19 NOTE — Progress Notes (Signed)
Rn unable to get in touch with pt.  VM not set up yet.

## 2018-05-19 NOTE — Progress Notes (Signed)
Patient feels physically safe at home.

## 2018-05-20 ENCOUNTER — Telehealth (HOSPITAL_BASED_OUTPATIENT_CLINIC_OR_DEPARTMENT_OTHER): Payer: Self-pay | Admitting: Registered Nurse

## 2018-05-20 ENCOUNTER — Other Ambulatory Visit (HOSPITAL_BASED_OUTPATIENT_CLINIC_OR_DEPARTMENT_OTHER): Payer: Self-pay | Admitting: Internal Medicine

## 2018-05-20 ENCOUNTER — Telehealth (HOSPITAL_BASED_OUTPATIENT_CLINIC_OR_DEPARTMENT_OTHER): Payer: Self-pay | Admitting: Licensed Practical Nurse

## 2018-05-20 DIAGNOSIS — R12 Heartburn: Secondary | ICD-10-CM | POA: Diagnosis not present

## 2018-05-20 DIAGNOSIS — K219 Gastro-esophageal reflux disease without esophagitis: Secondary | ICD-10-CM

## 2018-05-20 DIAGNOSIS — K2970 Gastritis, unspecified, without bleeding: Secondary | ICD-10-CM

## 2018-05-20 MED ORDER — LIDOCAINE VISCOUS 2 % MT SOLN
10.0000 mL | Freq: Four times a day (QID) | OROMUCOSAL | 0 refills | Status: DC | PRN
Start: 2018-05-20 — End: 2018-05-26

## 2018-05-20 MED ORDER — ONDANSETRON 4 MG PO TBDP
4.0000 mg | ORAL_TABLET | Freq: Once | ORAL | Status: AC
Start: 2018-05-20 — End: 2018-05-20
  Administered 2018-05-20: 4 mg via SUBLINGUAL
  Filled 2018-05-20: qty 1

## 2018-05-20 MED ORDER — LIDOCAINE VISCOUS 2 % MT SOLN: 10 mL | mL | Freq: Four times a day (QID) | 0 refills | 0 days | Status: DC | PRN

## 2018-05-20 NOTE — Progress Notes (Signed)
[facility], Samantha Cameron is a 62 year old female has requested a refill of lidocaine solution .      Last Office Visit: 05/14/2018 with simons christopher   Last Physical Exam: 08/15/2015    There are no preventive care reminders to display for this patient.    Other Med Adult:  Most Recent BP Reading(s)  05/20/18 : 121/64        Cholesterol (mg/dL)   Date Value   10/28/2017 131     LOW DENSITY LIPOPROTEIN DIRECT (mg/dL)   Date Value   10/28/2017 61     HIGH DENSITY LIPOPROTEIN (mg/dL)   Date Value   10/28/2017 47     TRIGLYCERIDES (mg/dL)   Date Value   10/28/2017 240 (H)         THYROID SCREEN TSH REFLEX FT4 (uIU/mL)   Date Value   05/14/2018 1.990         No results found for: TSH    HEMOGLOBIN A1C (%)   Date Value   05/14/2018 6.3 (H)       No results found for: POCA1C      INR (no units)   Date Value   04/18/2017 1.0   05/27/2016 1.0   02/01/2012 < 1.0 (L)       SODIUM (mmol/L)   Date Value   05/12/2018 142       POTASSIUM (mmol/L)   Date Value   05/12/2018 4.0           CREATININE (mg/dL)   Date Value   05/12/2018 0.7       Documented patient preferred pharmacies:    [facility] - [address], [place]  Phone: [phone] Fax: [phone]

## 2018-05-20 NOTE — Narrator Note (Signed)
Patient Disposition    Patient education for diagnosis, medications, activity, diet and follow-up.  Patient left ED 1:46 AM.  Patient rep received written instructions.  Interpreter to provide instructions: No    Patient belongings with patient: YES    Have all existing LDAs been addressed? Yes    Have all IV infusions been stopped? N/A    Discharged to: Discharged to home. Pt amb out of ED with steady gait

## 2018-05-20 NOTE — ED Provider Notes (Signed)
The patient was seen primarily by me. ED nursing record was reviewed. Prior records as available electronically through the Epic record were reviewed.         HPI:    This is a 62 year old female patient complaining of epigastric abdominal pain, similar to prior episodes, multiple presentations in the past several weeks to months.  Patient visited with GI doctor, taking omeprazole and Zantac with mild relief.  Patient also changed her diet, adhering to [employer].  Patient reports she deviated from the diet today, try to eat some plain steam chicken along with noodles and butter.  Patient reported symptoms worse after that.  Denies any shortness breath, dizziness, falls, lower extremity edema, radiation to jaw.  Reporting acidic taste in mouth.      ROS: Pertinent positives were reviewed as per the HPI above. All other systems were reviewed and are negative.      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) ([place])  09/14/2015: Cough  No date: Depression      Comment:  per steward records  No date: Diabetes  No date: Disorders of lipoid metabolism  No date: Esophageal reflux  No date: External hemorrhoid      Comment:  per steward records  No date: HTN (hypertension)  02/01/2012: Hypoxia  02/16/2017: Left ear pain  06/07/2015: Left genital labial  abscess  06/16/2015: Obesity, Class III, 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y) ([place])  08/16/2015: Osteopenia      Comment:  On xray of left knee per steward records 06/2014 Unclear                if addressed  09/14/2015: Right ear impacted cerumen  08/15/2015: Type 2 diabetes mellitus without complication, without   long-term current use of insulin (HCC)      Comment:   HEMOGLOBIN A1C (%) Date Value 07/15/2015 6.2 (H)                ----------  Steward record - a1c 6.2 on 01/31/15 6.4 on                09/30/14 5.9 on 12/29/13 6.4 08/18/13   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) ([place])     Tobacco use disorder     Spinal stenosis of lumbar region     HLD (hyperlipidemia)     Chronic bilateral low back pain with bilateral sciatica     Skin change     Pineal gland cyst     History of vitamin  D deficiency     Hemorrhoids     Venous (peripheral) insufficiency     Adrenal adenoma     Type 2 diabetes mellitus with microalbuminuria, without long-term current use of insulin (HCC)     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 (HCC)     Acute pain of right wrist     Abnormal PFTs     Screening for malignant neoplasm of breast     Skin irritation     H/O bone density study     Vertigo     Orthostatic hypotension     Social problem     Right arm weakness     Insomnia     Acute pain of left knee     Hx of total knee replacement, left     Acute bacterial sinusitis     Seasonal allergic rhinitis     Left foot pain     Stress due to family tension     Gastritis without bleeding     Leucocytosis     Viral URI with cough     Gastroesophageal reflux disease without esophagitis      HOSP-TO-HOME COMMUNITY COLLABORATION PROGRAM        Past Surgical History: Past Surgical History:  No date: ANES NERVE MUSC TENDON FASCIA&BURSA KNEE&/POPLT  No date: FOOT SURGERY      Comment:  bilateral  No date: LAPAROSCOPY SURG CHOLECYSTECTOMY  No date: OB ANTEPARTUM CARE CESAREAN DLVR & POSTPARTUM       Comment:  x3  No date: TONSILLECTOMY & ADENOIDECTOMY <AGE 43  04/13/2009: TOTAL KNEE REPLACEMENT      Comment:  left  08/11/2009: WRIST GANGLION EXCISION      Comment:  left       Medications:   Current Facility-Administered Medications   Medication    ondansetron (ZOFRAN-ODT) disintegrating tablet 4 mg     Current Outpatient Medications   Medication Sig    ranitidine (ZANTAC) 75 MG tablet Take 1 tablet PO at bedtime    omeprazole (PRILOSEC) 40 MG capsule Take 1 capsule by mouth twice daily. Take 30 minutes before meals    aluminum-magnesium hydroxide (MAALOX) 200-200 mg/5 mL suspension Take 30 mLs by mouth every 6 (six) hours as needed for Indigestion    lidocaine (XYLOCAINE) 2 % solution Take 10 mLs by mouth every 6 (six) hours as needed for Pain    sucralfate (CARAFATE) 1 GM/10ML suspension Take 10 mLs by mouth 4 (four) times daily    fluticasone (FLONASE) 50 MCG/ACT nasal spray 1 spray by Each Nostril route daily    VOLTAREN 1 % GEL gel apply 4 grams to affected area twice a day DO NOT EXCEED 32 G IN 1 DAY    albuterol (PROVENTIL) (2.5 MG/3ML) 0.083% nebulizer solution Take 3 mLs by nebulization every 6 (six) hours as needed for Wheezing    metoprolol (TOPROL-XL) 25 MG 24 hr tablet Take 1 tablet by mouth daily    lisinopril (PRINIVIL,ZESTRIL) 20 MG tablet take 1 tablet by mouth once daily    ONETOUCH DELICA LANCETS FINE MISC TEST once daily    metFORMIN (GLUCOPHAGE-XR) 500 MG 24 hr tablet take 4 tablets by mouth once daily WITH BREAKFAST    atorvastatin (LIPITOR) 20 MG tablet take 1 tablet by mouth once daily    ONETOUCH VERIO test strip TEST once daily    albuterol (PROVENTIL HFA,VENTOLIN HFA, PROAIR HFA) 108 (90 Base) MCG/ACT inhaler Inhale 2 puffs into the lungs every  6 (six) hours as needed for Wheezing or Shortness of breath    Omega-3 Fatty Acids (FISH OIL) 1000 MG CAPS Take by mouth    cholecalciferol (VITAMIN D3) 1000 UNIT tablet Take 1 tablet by mouth daily    pregabalin (LYRICA)  100 MG capsule Take 100 mg by mouth 2 (two) times daily With Dr Dwyane Dee    oxycodone-acetaminophen (PERCOCET) 5-325 MG per tablet Take 1 tablet by mouth 2 (two) times daily as needed With Dr Dwyane Dee    aspirin 81 MG tablet Take 81 mg by mouth daily.         Social History: Social History    Tobacco Use      Smoking status: Former Smoker        Packs/day: 0.50        Years: 31.00        Pack years: 15.5        Types: Cigarettes        Quit date: 03/10/2018        Years since quitting: 0.1      Smokeless tobacco: Never Used      Tobacco comment: quit smoking inform provided to patient    Alcohol use: No        Allergies:  Review of Patient's Allergies indicates:   Codeine camsylate       Shortness of Breath, Other (See                            Comments)    Comment:Chest pain   Motrin [ibuprofen]      Nausea Only   Sulfa antibiotics       Other (See Comments)   Bactrim                 Rash, Itching      Physical Exam:  ED Triage Vitals [05/19/18 2342]   ED Triage Vitals Brief Group      Temp 97.8 F      Pulse 101      Resp 20      BP 117/62      SpO2 93 %      Pain Score 5        GENERAL: No acute distress.   SKIN:  Warm & Dry, no rash.  HEAD: Atraumatic. PERRL. EOMI.  Oropharynx: clear.  NECK: No midline tenderness.  No LAN.   LUNGS:  Clear to auscultation bilaterally. No wheezes, rales, rhonchi.   HEART:  RRR.  No murmurs, rubs, or gallops.   ABDOMEN:  Soft, NTND.  No guarding or rebound tenderness.   MUSCULOSKELETAL:  No obvious deformities.    NEUROLOGIC: Alert and oriented.  Moves all extremities well.  PSYCHIATRIC:  Appropriate for age, time of day, and situation        ED Course and Medical Decision-making:    The patient is a  62 year old female with past medical history as above presents with anxiousness, epigastric burning abdominal pain.  EKG showing normal assessment no ischemia, no abnormal intervals.  Do not suspect cardiac etiology at this time.  Patient with multiple similar presentation in the past.   Reports is typical for her, appears to be exacerbated by deviation from brat diet.  Patient given GI cocktail, Pepcid with good response.  Resolution of symptoms.  Patient tolerating p.o., safe for discharge with PCP follow-up.  Scheduled for outpatient CT, no indication for emergent imaging at this time.  Additional verbal discharge instructions were provided including patients diagnosis and follow up plan, as well as reasons to return to the Emergency Department which were discussed in detail.  Patient is agreeable with this management.         Condition: Improved and Stable    Disposition:  Discharged to home    Diagnosis/Diagnoses:  Heart burn  [place]  Attending Physician  Emergency [place]  [phone]

## 2018-05-20 NOTE — Progress Notes (Signed)
[facility] LPN ER follow up call:   Pt was recently evaluated at [facility] ER for heartbrun.  Patient states she is doing ok, could but barely sleep last night .Patient states she is waiting t pick up her Lidocaine patch because it helps. SN advised patient to stay away from acidic foods, and to sleep with head elevated with af few pillows to help with reflux at night and aid in sleeping. Patient advised to c . Patient was advised to call Dr.Simons  at the [facility] or go to the nearest ER in case her symptoms don't improve or get worst by tomorrow . Patient verbalizes agreement to plan.

## 2018-05-20 NOTE — Progress Notes (Signed)
Rn called pt, ED f/u already done by lpn. Pt appt booked for 7/5.

## 2018-05-20 NOTE — Narrator Note (Signed)
PT STATES THE MEDS HELPED A LITTLE. C/O FEELING NAUSEOUS. MD AWARE.

## 2018-05-21 ENCOUNTER — Other Ambulatory Visit (HOSPITAL_BASED_OUTPATIENT_CLINIC_OR_DEPARTMENT_OTHER): Payer: Self-pay | Admitting: Physician Assistant

## 2018-05-21 ENCOUNTER — Ambulatory Visit
Admission: RE | Admit: 2018-05-21 | Discharge: 2018-05-21 | Disposition: A | Discharge status: Home / Self Care | Payer: Medicare Other | Attending: Physician Assistant | Admitting: Physician Assistant

## 2018-05-21 DIAGNOSIS — K869 Disease of pancreas, unspecified: Secondary | ICD-10-CM | POA: Diagnosis not present

## 2018-05-21 DIAGNOSIS — K838 Other specified diseases of biliary tract: Secondary | ICD-10-CM

## 2018-05-21 DIAGNOSIS — R634 Abnormal weight loss: Secondary | ICD-10-CM

## 2018-05-21 DIAGNOSIS — R1084 Generalized abdominal pain: Secondary | ICD-10-CM | POA: Insufficient documentation

## 2018-05-21 DIAGNOSIS — R109 Unspecified abdominal pain: Secondary | ICD-10-CM | POA: Diagnosis not present

## 2018-05-21 DIAGNOSIS — K219 Gastro-esophageal reflux disease without esophagitis: Secondary | ICD-10-CM | POA: Diagnosis not present

## 2018-05-21 MED ORDER — BARIUM SULFATE 2.1 % PO SUSP
450.0000 mL | ORAL | Status: DC | PRN
Start: 2018-05-21 — End: 2018-05-23
  Administered 2018-05-21: 450 mL via ORAL

## 2018-05-21 MED ORDER — IOHEXOL 350 MG/ML IV SOLN
120.00 mL | Freq: Once | INTRAVENOUS | Status: AC
Start: 2018-05-21 — End: 2018-05-21
  Administered 2018-05-21: 120 mL via INTRAVENOUS

## 2018-05-21 MED ORDER — NORMAL SALINE FLUSH 0.9 % IV SOLN
60.00 mL | Freq: Once | INTRAVENOUS | Status: AC
Start: 2018-05-21 — End: 2018-05-21
  Administered 2018-05-21: 60 mL via INTRAVENOUS

## 2018-05-22 ENCOUNTER — Emergency Department
Admission: EM | Admit: 2018-05-22 | Discharge: 2018-05-22 | Disposition: A | Discharge status: Home / Self Care | Payer: Medicare Other | Source: Intra-hospital | Attending: Student in an Organized Health Care Education/Training Program | Admitting: Student in an Organized Health Care Education/Training Program

## 2018-05-22 ENCOUNTER — Encounter (HOSPITAL_BASED_OUTPATIENT_CLINIC_OR_DEPARTMENT_OTHER): Payer: Self-pay

## 2018-05-22 DIAGNOSIS — Z7951 Long term (current) use of inhaled steroids: Secondary | ICD-10-CM | POA: Diagnosis not present

## 2018-05-22 DIAGNOSIS — J449 Chronic obstructive pulmonary disease, unspecified: Secondary | ICD-10-CM | POA: Diagnosis not present

## 2018-05-22 DIAGNOSIS — E119 Type 2 diabetes mellitus without complications: Secondary | ICD-10-CM | POA: Diagnosis present

## 2018-05-22 DIAGNOSIS — Z87891 Personal history of nicotine dependence: Secondary | ICD-10-CM

## 2018-05-22 DIAGNOSIS — I10 Essential (primary) hypertension: Secondary | ICD-10-CM | POA: Diagnosis not present

## 2018-05-22 DIAGNOSIS — Z7984 Long term (current) use of oral hypoglycemic drugs: Secondary | ICD-10-CM

## 2018-05-22 DIAGNOSIS — R1084 Generalized abdominal pain: Secondary | ICD-10-CM | POA: Diagnosis not present

## 2018-05-22 DIAGNOSIS — R197 Diarrhea, unspecified: Secondary | ICD-10-CM | POA: Diagnosis not present

## 2018-05-22 DIAGNOSIS — R109 Unspecified abdominal pain: Secondary | ICD-10-CM

## 2018-05-22 DIAGNOSIS — Z79899 Other long term (current) drug therapy: Secondary | ICD-10-CM

## 2018-05-22 DIAGNOSIS — E785 Hyperlipidemia, unspecified: Secondary | ICD-10-CM | POA: Diagnosis not present

## 2018-05-22 DIAGNOSIS — Z9049 Acquired absence of other specified parts of digestive tract: Secondary | ICD-10-CM

## 2018-05-22 LAB — BASIC METABOLIC PANEL
ANION GAP: 10 mmol/L (ref 5–15)
BUN (UREA NITROGEN): 8 mg/dL (ref 7–18)
CALCIUM: 9.4 mg/dL (ref 8.5–10.1)
CARBON DIOXIDE: 29 mmol/L (ref 21–32)
CHLORIDE: 104 mmol/L (ref 98–107)
CREATININE: 0.7 mg/dL (ref 0.4–1.2)
ESTIMATED GLOMERULAR FILT RATE: >60 mL/min (ref >60)
Glucose Random: 92 mg/dL (ref 74–160)
POTASSIUM: 4.7 mmol/L (ref 3.5–5.1)
SODIUM: 143 mmol/L (ref 136–145)

## 2018-05-22 LAB — HEPATIC FUNCTION PANEL
ALANINE AMINOTRANSFERASE: 56 U/L — ABNORMAL HIGH (ref 12–45)
ALBUMIN: 4 g/dL (ref 3.4–5.0)
ALKALINE PHOSPHATASE: 93 U/L (ref 45–117)
ASPARTATE AMINOTRANSFERASE: 29 U/L (ref 8–34)
BILIRUBIN DIRECT: 0.1 mg/dl (ref 0.0–0.2)
BILIRUBIN TOTAL: 0.3 mg/dL (ref 0.2–1.0)
INDIRECT BILIRUBIN: 0.2 mg/dL (ref 0.2–0.9)
TOTAL PROTEIN: 7.1 g/dL (ref 6.4–8.2)

## 2018-05-22 LAB — CBC, PLATELET & DIFFERENTIAL
ABSOLUTE BASO COUNT: 0 10*3/uL (ref 0.0–0.1)
ABSOLUTE EOSINOPHIL COUNT: 0.4 10*3/uL (ref 0.0–0.8)
ABSOLUTE IMM GRAN COUNT: 0.01 10*3/uL (ref 0.00–0.03)
ABSOLUTE LYMPH COUNT: 2.8 10*3/uL (ref 0.6–5.9)
ABSOLUTE MONO COUNT: 0.8 10*3/uL (ref 0.2–1.4)
ABSOLUTE NEUTROPHIL COUNT: 6.1 10*3/uL (ref 1.6–8.3)
BASOPHIL %: 0.2 % (ref 0.0–1.2)
EOSINOPHIL %: 4.1 % (ref 0.0–7.0)
HEMATOCRIT: 40.1 % (ref 34.1–44.9)
HEMOGLOBIN: 12.7 g/dL (ref 11.2–15.7)
IMMATURE GRANULOCYTE %: 0.1 % (ref 0.0–0.4)
LYMPHOCYTE %: 27.7 % (ref 15.0–54.0)
MEAN CORP HGB CONC: 31.7 g/dL (ref 31.0–37.0)
MEAN CORPUSCULAR HGB: 29.3 pg (ref 26.0–34.0)
MEAN CORPUSCULAR VOL: 92.6 fL (ref 80.0–100.0)
MEAN PLATELET VOLUME: 10.5 fL (ref 8.7–12.5)
MONOCYTE %: 7.9 % (ref 4.0–13.0)
NEUTROPHIL %: 60 % (ref 40.0–75.0)
PLATELET COUNT: 287 10*3/uL (ref 150–400)
RBC DISTRIBUTION WIDTH STD DEV: 47.5 fL — ABNORMAL HIGH (ref 35.1–46.3)
RBC DISTRIBUTION WIDTH: 14.4 % — ABNORMAL HIGH (ref 11.5–14.3)
RED BLOOD CELL COUNT: 4.33 M/uL (ref 3.90–5.20)
WHITE BLOOD CELL COUNT: 10.2 10*3/uL (ref 4.0–11.0)

## 2018-05-22 LAB — LIPASE: LIPASE: 141 U/L (ref 73–393)

## 2018-05-22 LAB — MAGNESIUM: MAGNESIUM: 1.9 mg/dL (ref 1.8–2.4)

## 2018-05-22 MED ORDER — ONDANSETRON HCL 4 MG/2ML IJ SOLN
INTRAMUSCULAR | Status: AC
Start: 2018-05-22 — End: 2018-05-22
  Administered 2018-05-22: 4 mg via INTRAVENOUS
  Filled 2018-05-22: qty 2

## 2018-05-22 MED ORDER — ONDANSETRON HCL 4 MG/2ML IJ SOLN
4.0000 mg | Freq: Once | INTRAMUSCULAR | Status: AC
Start: 2018-05-22 — End: 2018-05-22
  Administered 2018-05-22: 4 mg via INTRAVENOUS

## 2018-05-22 MED ORDER — ONDANSETRON HCL 4 MG PO TABS
4.0000 mg | ORAL_TABLET | Freq: Three times a day (TID) | ORAL | 0 refills | Status: AC | PRN
Start: 2018-05-22 — End: 2018-05-25

## 2018-05-22 MED ORDER — FAMOTIDINE 20 MG PO TABS: 20 mg | tablet | Freq: Two times a day (BID) | ORAL | 0 refills | 0 days | Status: DC | PRN

## 2018-05-22 MED ORDER — FAMOTIDINE 20 MG/2ML IV SOLN
INTRAVENOUS | Status: AC
Start: 2018-05-22 — End: 2018-05-22
  Administered 2018-05-22: 20 mg via INTRAVENOUS
  Filled 2018-05-22: qty 2

## 2018-05-22 MED ORDER — ONDANSETRON HCL 4 MG PO TABS: 4 mg | tablet | Freq: Three times a day (TID) | ORAL | 0 refills | 0 days | Status: AC | PRN

## 2018-05-22 MED ORDER — FAMOTIDINE 20 MG/2ML IV SOLN
20.0000 mg | Freq: Once | INTRAVENOUS | Status: AC
Start: 2018-05-22 — End: 2018-05-22
  Administered 2018-05-22: 20 mg via INTRAVENOUS

## 2018-05-22 MED ORDER — SODIUM CHLORIDE 0.9 % IV BOLUS
1000.0000 mL | Freq: Once | INTRAVENOUS | Status: AC
Start: 2018-05-22 — End: 2018-05-22
  Administered 2018-05-22: 1000 mL via INTRAVENOUS

## 2018-05-22 MED ORDER — FAMOTIDINE 20 MG PO TABS
20.0000 mg | ORAL_TABLET | Freq: Two times a day (BID) | ORAL | 0 refills | Status: DC | PRN
Start: 2018-05-22 — End: 2018-06-05

## 2018-05-22 NOTE — ED Triage Note (Signed)
Pt presents to ED with burning epigastric abd pain w/ nausea starting yesterday afternoon after drinking oral contrast for an outpt CT abd here. Seen here for similar symptoms last week.

## 2018-05-22 NOTE — Discharge Instructions (Signed)
The exact cause of your abdominal pain is not certain today however your labs did not show any significant abnormality or signs of infection.  Please use medication as prescribed at home.  Drink fluids to remain well-hydrated.  Follow-up with your primary care doctor in 1 to 2 weeks.  Return to the emergency department for worsening symptoms or other concerns.

## 2018-05-22 NOTE — Narrator Note (Signed)
Patient Disposition    Patient education for diagnosis, medications, activity, diet and follow-up.  Patient left ED 5:25 AM.  Patient rep received written instructions.  Interpreter to provide instructions: No    Patient belongings with patient: YES    Have all existing LDAs been addressed? Yes    Have all IV infusions been stopped? Yes    Discharged to: Discharged to home    Pt verbalizes understanding d/c instructions. Walks with steady gait

## 2018-05-22 NOTE — ED Provider Notes (Signed)
I have reviewed the ED nursing notes and prior records. I have reviewed the patient's past medical history/problem list, allergies, social history and medication list.  I saw this patient primarily.    HPI:  This 62 year old female patient presents with chief complaint of diffuse burning discomfort throughout her abdomen with an episode of diarrhea that started after the patient drank 2 bottles of oral contrast for CT imaging of the abdomen and pelvis earlier in the day.  The patient reports that she tried taking Maalox, viscous lidocaine and Mylanta at home with only temporary relief of her symptoms.  Burning is now throughout the entire abdomen.  She does not have any vomiting.  No chest pain or difficulty breathing.    ROS: Pertinent positives were reviewed as per the HPI above. All other systems were reviewed and are negative.    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  No date: Esophageal reflux  No date: External hemorrhoid      Comment:  per steward records  No date: HTN (hypertension)  02/01/2012: Hypoxia  02/16/2017: Left ear pain  06/07/2015: Left genital labial abscess  06/16/2015: Obesity, Class  III, 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y) ([place])  08/16/2015: Osteopenia      Comment:  On xray of left knee per steward records 06/2014 Unclear                if addressed  09/14/2015: Right ear impacted cerumen  08/15/2015: Type 2 diabetes mellitus without complication, without   long-term current use of insulin (HCC)      Comment:   HEMOGLOBIN A1C (%) Date Value 07/15/2015 6.2 (H)                ----------  [facility] record - a1c 6.2 on 01/31/15 6.4 on                09/30/14 5.9 on 12/29/13 6.4 08/18/13   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) ([place])     Tobacco use disorder     Spinal stenosis of lumbar region     HLD (hyperlipidemia)     Chronic bilateral low back pain with bilateral sciatica     Skin change     Pineal gland cyst     History of vitamin D deficiency     Hemorrhoids  Venous (peripheral) insufficiency     Adrenal adenoma     Type 2 diabetes mellitus with microalbuminuria, without long-term current use of insulin (HCC)     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 (HCC)     Acute pain of right wrist     Abnormal PFTs     Screening for malignant neoplasm of breast     Skin irritation     H/O bone density study     Vertigo     Orthostatic hypotension     Social problem     Right arm weakness     Insomnia     Acute pain of left knee     Hx of total knee replacement, left     Acute bacterial sinusitis     Seasonal allergic rhinitis     Left foot pain     Stress due to family tension     Gastritis without bleeding     Leucocytosis     Viral URI with cough     Gastroesophageal reflux disease without esophagitis      HOSP-TO-HOME COMMUNITY COLLABORATION PROGRAM      Past Surgical History:  Past Surgical History:  No date: ANES NERVE MUSC TENDON FASCIA&BURSA KNEE&/POPLT  No date: FOOT SURGERY      Comment:  bilateral  No date: LAPAROSCOPY SURG CHOLECYSTECTOMY  No date: OB ANTEPARTUM CARE CESAREAN DLVR & POSTPARTUM      Comment:  x3  No date:  TONSILLECTOMY & ADENOIDECTOMY <AGE 60  04/13/2009: TOTAL KNEE REPLACEMENT      Comment:  left  08/11/2009: WRIST GANGLION EXCISION      Comment:  left     Medications:   Current Facility-Administered Medications   Medication    sodium chloride 0.9 % IV bolus 1,000 mL    ondansetron (ZOFRAN) injection 4 mg    famotidine (PEPCID) injection 20 mg     Current Outpatient Medications   Medication Sig    lidocaine (XYLOCAINE) 2 % solution Take 10 mLs by mouth every 6 (six) hours as needed for Pain    ranitidine (ZANTAC) 75 MG tablet Take 1 tablet PO at bedtime    omeprazole (PRILOSEC) 40 MG capsule Take 1 capsule by mouth twice daily. Take 30 minutes before meals    aluminum-magnesium hydroxide (MAALOX) 200-200 mg/5 mL suspension Take 30 mLs by mouth every 6 (six) hours as needed for Indigestion    sucralfate (CARAFATE) 1 GM/10ML suspension Take 10 mLs by mouth 4 (four) times daily    fluticasone (FLONASE) 50 MCG/ACT nasal spray 1 spray by Each Nostril route daily    VOLTAREN 1 % GEL gel apply 4 grams to affected area twice a day DO NOT EXCEED 32 G IN 1 DAY    albuterol (PROVENTIL) (2.5 MG/3ML) 0.083% nebulizer solution Take 3 mLs by nebulization every 6 (six) hours as needed for Wheezing    metoprolol (TOPROL-XL) 25 MG 24 hr tablet Take 1 tablet by mouth daily    lisinopril (PRINIVIL,ZESTRIL) 20 MG tablet take 1 tablet by mouth once daily    ONETOUCH DELICA LANCETS FINE MISC TEST once daily    metFORMIN (GLUCOPHAGE-XR) 500 MG 24 hr tablet take 4 tablets by mouth once daily WITH BREAKFAST    atorvastatin (LIPITOR) 20 MG tablet take 1 tablet by mouth once daily    ONETOUCH VERIO test strip TEST once daily    albuterol (PROVENTIL HFA,VENTOLIN HFA, PROAIR HFA) 108 (90 Base) MCG/ACT inhaler Inhale 2 puffs  into the lungs every 6 (six) hours as needed for Wheezing or Shortness of breath    Omega-3 Fatty Acids (FISH OIL) 1000 MG CAPS Take by mouth    cholecalciferol (VITAMIN D3) 1000 UNIT tablet Take 1 tablet  by mouth daily    pregabalin (LYRICA) 100 MG capsule Take 100 mg by mouth 2 (two) times daily With Dr Dwyane Dee    oxycodone-acetaminophen (PERCOCET) 5-325 MG per tablet Take 1 tablet by mouth 2 (two) times daily as needed With Dr Dwyane Dee    aspirin 81 MG tablet Take 81 mg by mouth daily.     Facility-Administered Medications Ordered in Other Encounters   Medication    barium sulfate 2.1 % suspension 450 mL       Social History:  Social History    Tobacco Use      Smoking status: Former Smoker        Packs/day: 0.50        Years: 31.00        Pack years: 15.5        Types: Cigarettes        Quit date: 03/10/2018        Years since quitting: 0.2      Smokeless tobacco: Never Used      Tobacco comment: quit smoking inform provided to patient    Alcohol use: No      Allergies:  Review of Patient's Allergies indicates:   Codeine camsylate       Shortness of Breath, Other (See                            Comments)    Comment:Chest pain   Motrin [ibuprofen]      Nausea Only   Sulfa antibiotics       Other (See Comments)   Bactrim                 Rash, Itching    Physical Exam:  BP (!) 110/43  Pulse 88  Temp 97.8 F  Resp 18  Wt 104.3 kg (230 lb)  LMP 10/22/2007  SpO2 96%  BMI 43.71 kg/m2  GENERAL:  Well-appearing, no distress.  SKIN:  Warm & Dry, no rash, no bruising.  HEAD:  Atraumatic. PERRL. Anicteric sclera. Oropharynx clear.  NECK:  Supple with full painless ROM at the neck  LUNGS:  Clear to auscultation bilaterally without rales, rhonchi or wheezing.   HEART:  RRR.  No murmurs, rubs, or gallops.   ABDOMEN:  Soft, flat, without distension.  Mild diffuse nonfocal tenderness.  MUSCULOSKELETAL:  No deformities. Well-perfused extremities with  2+ DP pulses bilaterally. No cyanosis or edema.  NEUROLOGIC:  Normal speech.  alert & oriented x 3, CNsII-XII grossly intact. Gait normal.  PSYCHIATRIC:  Normal affect    DIAGNOSTICS:     Results for orders placed or performed during the hospital encounter of 05/22/18 (from the past 24  hour(s))   CBC, Platelet & Differential    Collection Time: 05/22/18  4:10 AM   Result Value    WHITE BLOOD CELL COUNT 10.2    RED BLOOD CELL COUNT 4.33    HEMOGLOBIN 12.7    HEMATOCRIT 40.1    MEAN CORPUSCULAR VOL 92.6    MEAN CORPUSCULAR HGB 29.3    MEAN CORP HGB CONC 31.7    RBC DISTRIBUTION WIDTH STD DEV 47.5 (H)    RBC DISTRIBUTION WIDTH 14.4 (H)    PLATELET  COUNT 287    MEAN PLATELET VOLUME 10.5    NEUTROPHIL % 60.0    IMMATURE GRANULOCYTE % 0.1    LYMPHOCYTE % 27.7    MONOCYTE % 7.9    EOSINOPHIL % 4.1    BASOPHIL % 0.2    ABSOLUTE NEUTROPHIL COUNT 6.1    ABSOLUTE IMM GRAN COUNT 0.01    ABSOLUTE LYMPH COUNT 2.8    ABSOLUTE MONO COUNT 0.8    ABSOLUTE EOSINOPHIL COUNT 0.4    ABSOLUTE BASO COUNT 0.0   Basic Metabolic Panel    Collection Time: 05/22/18  4:10 AM   Result Value    SODIUM 143    POTASSIUM 4.7    CHLORIDE 104    CARBON DIOXIDE 29    ANION GAP 10    CALCIUM 9.4    Glucose Random 92    BUN (UREA NITROGEN) 8    CREATININE 0.7    ESTIMATED GLOMERULAR FILT RATE > 60   Magnesium    Collection Time: 05/22/18  4:10 AM   Result Value    MAGNESIUM 1.9   Hepatic Function Panel    Collection Time: 05/22/18  4:10 AM   Result Value    TOTAL PROTEIN 7.1    ALBUMIN 4.0    BILIRUBIN TOTAL 0.3    BILIRUBIN DIRECT 0.1    INDIRECT BILIRUBIN 0.2    ALKALINE PHOSPHATASE 93    ASPARTATE AMINOTRANSFERASE 29    ALANINE AMINOTRANSFERASE 56 (H)   Lipase    Collection Time: 05/22/18  4:10 AM   Result Value    LIPASE 141       ED Course and Medical Decision-making:    The patient is a 62 year old female with burning diffuse abdominal discomfort in the setting of patient drinking oral contrast today for CT imaging of the abdomen and pelvis.  No current read for the CT imaging that the patient had earlier in the day.  Her abdomen is relatively benign with some mild diffuse nonfocal tenderness.  She does have a history of reflux and episode of diarrhea is likely result of the oral contrast.    Plan for labs, IV fluids, Zofran and  Pepcid and reassessment.    Labs unremarkable.  Patient improved with IV fluids and medications.  May be just some irritation related to the oral contrast ingestion earlier in the day.  Patient instructed to use Zofran, Pepcid at home and follow-up with primary care.    VSS BP (!) 110/43  Pulse 88  Temp 97.8 F  Resp 18  Wt 104.3 kg (230 lb)  LMP 10/22/2007  SpO2 96%  BMI 43.71 kg/m2    Reasons to return to the ED were reviewed in detail. The patient agrees with this plan and disposition.    Disposition: Discharge    Diagnosis/Diagnoses:  Abdominal pain, unspecified abdominal location    Rebeca Allegra, MD  Emergency Medicine Attending [place]

## 2018-05-23 ENCOUNTER — Ambulatory Visit: Payer: Medicare Other | Attending: Internal Medicine | Admitting: Internal Medicine

## 2018-05-23 ENCOUNTER — Telehealth (HOSPITAL_BASED_OUTPATIENT_CLINIC_OR_DEPARTMENT_OTHER): Payer: Self-pay | Admitting: Case Management

## 2018-05-23 ENCOUNTER — Encounter (HOSPITAL_BASED_OUTPATIENT_CLINIC_OR_DEPARTMENT_OTHER): Payer: Self-pay | Admitting: Internal Medicine

## 2018-05-23 ENCOUNTER — Other Ambulatory Visit (HOSPITAL_BASED_OUTPATIENT_CLINIC_OR_DEPARTMENT_OTHER): Payer: Self-pay | Admitting: Social Worker

## 2018-05-23 ENCOUNTER — Encounter (HOSPITAL_BASED_OUTPATIENT_CLINIC_OR_DEPARTMENT_OTHER): Payer: Medicare Other | Admitting: Physician Assistant

## 2018-05-23 VITALS — BP 100/50 | HR 73 | Temp 98.8°F | Wt 229.0 lb

## 2018-05-23 DIAGNOSIS — G479 Sleep disorder, unspecified: Secondary | ICD-10-CM | POA: Diagnosis not present

## 2018-05-23 DIAGNOSIS — R1013 Epigastric pain: Secondary | ICD-10-CM | POA: Diagnosis not present

## 2018-05-23 DIAGNOSIS — K760 Fatty (change of) liver, not elsewhere classified: Secondary | ICD-10-CM | POA: Insufficient documentation

## 2018-05-23 DIAGNOSIS — K429 Umbilical hernia without obstruction or gangrene: Secondary | ICD-10-CM | POA: Diagnosis not present

## 2018-05-23 DIAGNOSIS — M47819 Spondylosis without myelopathy or radiculopathy, site unspecified: Secondary | ICD-10-CM | POA: Insufficient documentation

## 2018-05-23 DIAGNOSIS — Z8719 Personal history of other diseases of the digestive system: Secondary | ICD-10-CM | POA: Insufficient documentation

## 2018-05-23 DIAGNOSIS — M419 Scoliosis, unspecified: Secondary | ICD-10-CM | POA: Diagnosis present

## 2018-05-23 DIAGNOSIS — K869 Disease of pancreas, unspecified: Secondary | ICD-10-CM | POA: Insufficient documentation

## 2018-05-23 DIAGNOSIS — F419 Anxiety disorder, unspecified: Secondary | ICD-10-CM | POA: Diagnosis present

## 2018-05-23 DIAGNOSIS — K2950 Unspecified chronic gastritis without bleeding: Secondary | ICD-10-CM | POA: Insufficient documentation

## 2018-05-23 DIAGNOSIS — K838 Other specified diseases of biliary tract: Secondary | ICD-10-CM | POA: Insufficient documentation

## 2018-05-23 DIAGNOSIS — I998 Other disorder of circulatory system: Secondary | ICD-10-CM | POA: Diagnosis not present

## 2018-05-23 DIAGNOSIS — R1010 Upper abdominal pain, unspecified: Secondary | ICD-10-CM | POA: Insufficient documentation

## 2018-05-23 HISTORY — DX: Disease of pancreas, unspecified: K86.9

## 2018-05-23 HISTORY — DX: Epigastric pain: R10.13

## 2018-05-23 NOTE — Progress Notes (Signed)
Samantha Cameron is a 62 year old female here for follow-up ED - GERD, anxiety    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) ([place])     Tobacco use disorder     Spinal stenosis of lumbar region     HLD (hyperlipidemia)     Chronic bilateral low back pain with bilateral sciatica     Skin change     Pineal gland cyst     History of vitamin D deficiency     Hemorrhoids     Venous (peripheral) insufficiency     Adrenal adenoma     Type 2 diabetes mellitus with microalbuminuria, without long-term current use of insulin (HCC)     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 (HCC)     Acute pain of right wrist     Abnormal PFTs     Screening for malignant neoplasm of breast     Skin irritation     H/O bone density study     Vertigo     Orthostatic hypotension     Social problem     Right arm weakness     Insomnia     Acute pain of left knee     Hx of total knee replacement, left     Acute bacterial sinusitis     Seasonal allergic rhinitis     Left foot pain     Stress due to family tension     Gastritis without bleeding     Leucocytosis     Gastroesophageal reflux disease without esophagitis      HOSP-TO-HOME COMMUNITY COLLABORATION PROGRAM    Since last visit 6/26 - patient has been back to ED 2 times for same epigastric/abdominal pain/GERD - once 7/1 and then again 7/4 after CT scan 7/3  Has seen GI team at visit 7/1 - plan for CT scan, EGD, colonoscopy  CT occurred but patient thinks contrast irritated stomach and went to ED  Continues to feel burning pain up into chest  Feels like cannot breath when this occurs, becomes anxious and goes to ED  Upset that EGD, colonoscopy not set up until 10/31  Taking higher dose PPI and altered H2 blocker dose from GI visit  Has maalox and lidocaine to make prn treatment at home    CT 7/3 - results discussed   "FINDINGS:     Lower thorax: Unremarkable.    Liver: There is relative hypoattenuation of the liver  suggestive of mild fatty infiltration.    Gallbladder: Gallbladder is surgically absent.     Biliary: The common bile duct is measuring 9 mm. Mild intrahepatic ductal prominence.    Pancreas: Normal enhancement of the pancreas. There is a hypodense lesion in the head of the pancreas measuring 5 mm. Another hypodense lesion seen in the body of the pancreas however, it has fat attenuation measuring 4 mm. This may represent a small lipoma. Another hypodense lesion in the uncinate process. If indicated, follow-up with an MRCP suggested.    Spleen: Unremarkable.    Adrenals: Unremarkable.    Kidneys: No hydronephrosis. No nephrolithiasis.     Stomach: Stomach is not fully distended.    Bowel: Small bowel loops are nondilated. No colonic wall thickening. Terminal ileum is unremarkable. No diverticuli.     Appendix: Unremarkable.    Peritoneal cavity: No ascites or fluid collection. No free air.     Bladder: Unremarkable.    Reproductive  organs: No adnexal masses.    Vessels: Scattered arthroscopic vascular calcifications.    Lymph nodes: Scattered retroperitoneal and mesenteric lymph nodes not enlarged by CT size criteria.    Abdominal wall: There is a small fat-containing umbilical hernia.    Bones: Mild degenerative changes of the spine. There is mild scoliosis.    IMPRESSION:   1. No acute intra-abdominal process.  2. Mild hepatic steatosis.  3. Status post cholecystectomy. Slightly prominent common bile duct and intrahepatic ductal dilatation.   4. 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 If indicated, follow-up with an MRCP is suggested."    Patient reports continues to have difficulty sleeping with anxiety, discomfort  Asks for medication to help sleep  Reports has not had sleep study ordered in May  Called sleep lab during visit - was ordered for home study - cannot have due to insurance - needs in lab - scheduled in  August, will be on cancellation list for sooner  Patient agrees to therapy referral     Most Recent BP Reading(s)  05/23/18 : 100/50  05/22/18 : 101/51  05/20/18 : 121/64  05/19/18 : 128/62  05/14/18 : 108/80    Most Recent Weight Reading(s)  05/23/18 : 103.9 kg (229 lb)  05/22/18 : 104.3 kg (230 lb)  05/19/18 : 104.3 kg (230 lb)  05/19/18 : 103.9 kg (229 lb)  05/14/18 : 106.6 kg (235 lb)    PE:  BP 100/50  Pulse 73  Temp 98.8 F (37.1 C)  Wt 103.9 kg (229 lb)  LMP 10/22/2007  SpO2 96%  BMI 43.52 kg/m2  Estimated body mass index is 43.52 kg/m as calculated from the following:    Height as of 05/14/18: 5' 0.83" (1.545 m).    Weight as of this encounter: 103.9 kg (229 lb).  Gen - Morbidly obese middle aged female upright in chair in NAD  HEENT - MMM  Abd - +BS, soft, ND, mild epigastric TTP, no rebound or guarding  Ext - Warm, dry  Neuro - A+Ox3  Psych - Anxious at times    A/P:  (R10.13) Epigastric pain  (primary encounter diagnosis)  (Z87.19) Hx of gastritis  (F41.9) Anxiety  (G47.9) Sleep disturbance  Comment: Likely gastritis/GERD - needs EGD - scheduled 10/31  Plan: RFL TO ADULT OUTPT PSYCH (NEW) [facility]  Continue current regimen  Encouraged patient to call GI to try to move up EGD/colonoscopy  This Probation officer to message GI as well  Agrees to therapy for anxiety reduction, coping with medical symptoms  Sleep study scheduled     (K83.8) Common bile duct dilation  (K86.9) Pancreatic lesion  Comment: discussed MRI results - plan for MRCP  Plan: MRCP W & WO CONTRAST  Patient reports will likely need premedication - encouraged to contact this writer once scheduled to discuss    (K76.0) Fatty liver  Comment: discussed   Plan: Encouraged healthy eating, exercise as tolerated for weight loss  Continue DM and HLD treatments    (I99.8) Vascular calcification  Comment: discussed  Plan: Monitor for chest symptoms    (Z61.0) Umbilical hernia without obstruction and without gangrene  Comment: discussed, likely  asymptomatic    (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

## 2018-05-23 NOTE — Progress Notes (Signed)
Thank you for your referral. We were able to contact your patient. She was given an appointment for 06/25/18  10am with Lulu Riding at [facility].

## 2018-05-23 NOTE — Progress Notes (Signed)
Call from patient and gave RN permission to speak with her daughter Barnett Applebaum.  Barnett Applebaum concerned re mother's symptoms and recent testing.  Discussed with her results of recent CT scan and that additional testing has been ordered.  Answered questions and discussed she may want to speak with PCP   Barnett Applebaum is concerned that PCP may not fully understand patient's emotional health.    Plan  Ask PCP to give [facility] call Patient gave permission for Barnett Applebaum to speak with PCP

## 2018-05-23 NOTE — Progress Notes (Signed)
Called dtr  As far as I am aware - I do not have a release to discuss protected health info with her  No answer  Left voicemail that we are happy to give her an update if her mother can give [place] verbal or written permission  Will call family next week to check in

## 2018-05-23 NOTE — Telephone Encounter (Signed)
-----   Message from Mickle Asper sent at 05/23/2018  3:16 PM EDT -----  Regarding: Requesting callback - regarding recent visit   Contact: [place] [phone], 62 year old, female    Calls today:  Clinical Questions ([place])    Name of person calling Daughter  Specific nature of request Requesting callback regarding the appt patient had today - states that when patient came home she was not clear or making sense when she was attempting to explain what happened during appt. Daughter would like clarification   Return phone number [phone]    Patient's language of care: English    Patient does not need an interpreter.    Patient's PCP: Suann Larry. Simons,MD

## 2018-05-23 NOTE — Progress Notes (Signed)
Thank you for your recent referral. A message was left for the patient, asking them to call [facility] back at [phone] to schedule an appointment with us.    [facility]

## 2018-05-25 ENCOUNTER — Telehealth (HOSPITAL_BASED_OUTPATIENT_CLINIC_OR_DEPARTMENT_OTHER): Payer: Self-pay | Admitting: Internal Medicine

## 2018-05-25 ENCOUNTER — Encounter (HOSPITAL_BASED_OUTPATIENT_CLINIC_OR_DEPARTMENT_OTHER): Payer: Self-pay | Admitting: Emergency Medicine

## 2018-05-25 ENCOUNTER — Emergency Department
Admission: EM | Admit: 2018-05-25 | Discharge: 2018-05-25 | Disposition: A | Discharge status: Home / Self Care | Payer: Medicare Other | Source: Intra-hospital | Attending: Emergency Medicine | Admitting: Emergency Medicine

## 2018-05-25 DIAGNOSIS — R1084 Generalized abdominal pain: Secondary | ICD-10-CM | POA: Diagnosis not present

## 2018-05-25 DIAGNOSIS — I10 Essential (primary) hypertension: Secondary | ICD-10-CM | POA: Diagnosis not present

## 2018-05-25 DIAGNOSIS — Z79899 Other long term (current) drug therapy: Secondary | ICD-10-CM | POA: Diagnosis not present

## 2018-05-25 DIAGNOSIS — Z87891 Personal history of nicotine dependence: Secondary | ICD-10-CM | POA: Diagnosis not present

## 2018-05-25 DIAGNOSIS — E119 Type 2 diabetes mellitus without complications: Secondary | ICD-10-CM | POA: Diagnosis not present

## 2018-05-25 DIAGNOSIS — R109 Unspecified abdominal pain: Secondary | ICD-10-CM | POA: Diagnosis not present

## 2018-05-25 DIAGNOSIS — Z7984 Long term (current) use of oral hypoglycemic drugs: Secondary | ICD-10-CM

## 2018-05-25 DIAGNOSIS — R0789 Other chest pain: Secondary | ICD-10-CM | POA: Diagnosis present

## 2018-05-25 DIAGNOSIS — R1013 Epigastric pain: Secondary | ICD-10-CM | POA: Diagnosis not present

## 2018-05-25 DIAGNOSIS — R079 Chest pain, unspecified: Secondary | ICD-10-CM

## 2018-05-25 LAB — COMPREHENSIVE METABOLIC PANEL
ALANINE AMINOTRANSFERASE: 44 U/L (ref 12–45)
ALBUMIN: 3.8 g/dL (ref 3.4–5.0)
ALKALINE PHOSPHATASE: 88 U/L (ref 45–117)
ANION GAP: 8 mmol/L (ref 5–15)
ASPARTATE AMINOTRANSFERASE: 27 U/L (ref 8–34)
BILIRUBIN TOTAL: 0.3 mg/dL (ref 0.2–1.0)
BUN (UREA NITROGEN): 11 mg/dL (ref 7–18)
CALCIUM: 9.3 mg/dL (ref 8.5–10.1)
CARBON DIOXIDE: 29 mmol/L (ref 21–32)
CHLORIDE: 104 mmol/L (ref 98–107)
CREATININE: 0.7 mg/dL (ref 0.4–1.2)
ESTIMATED GLOMERULAR FILT RATE: >60 mL/min (ref >60)
Glucose Random: 94 mg/dL (ref 74–160)
POTASSIUM: 4.3 mmol/L (ref 3.5–5.1)
SODIUM: 141 mmol/L (ref 136–145)
TOTAL PROTEIN: 6.9 g/dL (ref 6.4–8.2)

## 2018-05-25 LAB — CBC, PLATELET & DIFFERENTIAL
ABSOLUTE BASO COUNT: 0 10*3/uL (ref 0.0–0.1)
ABSOLUTE EOSINOPHIL COUNT: 0.4 10*3/uL (ref 0.0–0.8)
ABSOLUTE IMM GRAN COUNT: 0.01 10*3/uL (ref 0.00–0.03)
ABSOLUTE LYMPH COUNT: 2.4 10*3/uL (ref 0.6–5.9)
ABSOLUTE MONO COUNT: 0.7 10*3/uL (ref 0.2–1.4)
ABSOLUTE NEUTROPHIL COUNT: 5.7 10*3/uL (ref 1.6–8.3)
BASOPHIL %: 0.2 % (ref 0.0–1.2)
EOSINOPHIL %: 4.2 % (ref 0.0–7.0)
HEMATOCRIT: 38.5 % (ref 34.1–44.9)
HEMOGLOBIN: 12.2 g/dL (ref 11.2–15.7)
IMMATURE GRANULOCYTE %: 0.1 % (ref 0.0–0.4)
LYMPHOCYTE %: 25.8 % (ref 15.0–54.0)
MEAN CORP HGB CONC: 31.7 g/dL (ref 31.0–37.0)
MEAN CORPUSCULAR HGB: 29.2 pg (ref 26.0–34.0)
MEAN CORPUSCULAR VOL: 92.1 fL (ref 80.0–100.0)
MEAN PLATELET VOLUME: 10.8 fL (ref 8.7–12.5)
MONOCYTE %: 7.9 % (ref 4.0–13.0)
NEUTROPHIL %: 61.8 % (ref 40.0–75.0)
PLATELET COUNT: 282 10*3/uL (ref 150–400)
RBC DISTRIBUTION WIDTH STD DEV: 45.9 fL (ref 35.1–46.3)
RBC DISTRIBUTION WIDTH: 14 % (ref 11.5–14.3)
RED BLOOD CELL COUNT: 4.18 M/uL (ref 3.90–5.20)
WHITE BLOOD CELL COUNT: 9.2 10*3/uL (ref 4.0–11.0)

## 2018-05-25 LAB — LIPASE: LIPASE: 152 U/L (ref 73–393)

## 2018-05-25 LAB — TROPONIN I: TROPONIN I: <0.02 ng/mL (ref 0.00–0.04)

## 2018-05-25 LAB — EKG

## 2018-05-25 MED ORDER — LIDOCAINE VISCOUS 2 % MT SOLN
10.0000 mL | Freq: Once | OROMUCOSAL | Status: AC
Start: 2018-05-25 — End: 2018-05-25
  Administered 2018-05-25: 10 mL via OROMUCOSAL
  Filled 2018-05-25: qty 15

## 2018-05-25 MED ORDER — ALUMINUM & MAGNESIUM HYDROXIDE 200-200 MG/5ML PO SUSP
30.0000 mL | Freq: Once | ORAL | Status: AC
Start: 2018-05-25 — End: 2018-05-25
  Administered 2018-05-25: 30 mL via ORAL
  Filled 2018-05-25: qty 30

## 2018-05-25 NOTE — Narrator Note (Signed)
Dinner tray provided at patient's request.  Provider aware.

## 2018-05-25 NOTE — ED Triage Note (Signed)
Patient states that she has burning in her abdomen and radiating to her epigastric area since having a CT on July 3rd.

## 2018-05-25 NOTE — ED Provider Notes (Signed)
ED nursing record was reviewed. Prior records as available electronically through the Epic record were reviewed.      HPI:    This 62 year old female patient presents to the Emergency Department with chief complaint of abdominal pain.  Patient says she has burning throughout her abdomen that radiates up into her chest.  It got worse shortly before arrival.  She says she started having a burning when she was drinking the CT dye for her CAT scan on July 3.  No vomiting or diarrhea.  She is not having trouble breathing right now but she said that earlier she was a little bit short of breath.      ROS: Pertinent positives were reviewed as per the HPI above. All other systems were reviewed and are negative.      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  No date: Esophageal reflux  No date: External hemorrhoid      Comment:  per steward records  No date: HTN (hypertension)  02/01/2012: Hypoxia  02/16/2017: Left ear pain  06/07/2015: Left genital labial abscess  06/16/2015: Obesity, Class III, 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y) ([place])  08/16/2015: Osteopenia      Comment:  On xray of left knee per steward records 06/2014 Unclear                 if addressed  09/14/2015: Right ear impacted cerumen  08/15/2015: Type 2 diabetes mellitus without complication, without   long-term current use of insulin (HCC)      Comment:   HEMOGLOBIN A1C (%) Date Value 07/15/2015 6.2 (H)                ----------  Steward record - a1c 6.2 on 01/31/15 6.4 on                09/30/14 5.9 on 12/29/13 6.4 08/18/13   11/18/2017: Viral URI with cough  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) ([place])     Tobacco use disorder     Spinal stenosis of lumbar region     HLD (hyperlipidemia)     Chronic bilateral low back pain with bilateral sciatica     Skin change     Pineal gland cyst     History of vitamin D deficiency     Hemorrhoids     Venous (  peripheral) insufficiency     Adrenal adenoma     Type 2 diabetes mellitus with microalbuminuria, without long-term current use of insulin (HCC)     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 (HCC)     Acute pain of right wrist     Abnormal PFTs     Screening for malignant neoplasm of breast     Skin irritation     H/O bone density study     Vertigo     Orthostatic hypotension     Social problem     Right arm weakness     Insomnia     Acute pain of left knee     Hx of total knee replacement, left     Acute bacterial sinusitis     Seasonal allergic rhinitis     Left foot pain     Stress due to family tension     Gastritis without bleeding     Leucocytosis     Gastroesophageal reflux disease without esophagitis      HOSP-TO-HOME COMMUNITY COLLABORATION PROGRAM     Epigastric pain     Hx of gastritis     Common bile duct dilation     Fatty liver     Pancreatic lesion     Vascular calcification     Umbilical hernia without obstruction and without gangrene     Arthritis of spine     Scoliosis of thoracic spine     Anxiety     Sleep disturbance              Past Surgical History: Past Surgical History:  No date: ANES NERVE MUSC TENDON FASCIA&BURSA KNEE&/POPLT  No date: FOOT  SURGERY      Comment:  bilateral  No date: LAPAROSCOPY SURG CHOLECYSTECTOMY  No date: OB ANTEPARTUM CARE CESAREAN DLVR & POSTPARTUM      Comment:  x3  No date: TONSILLECTOMY & ADENOIDECTOMY <AGE 1  04/13/2009: TOTAL KNEE REPLACEMENT      Comment:  left  08/11/2009: WRIST GANGLION EXCISION      Comment:  left       Medications:   No current facility-administered medications on file prior to encounter.   Current Outpatient Medications on File Prior to Encounter:  ondansetron (ZOFRAN) 4 MG tablet Take 1 tablet by mouth every 8 (eight) hours as needed for nausea and vomitting  for up to 3 days Disp: 10 tablet Rfl: 0   famotidine (PEPCID) 20 MG tablet Take 1 tablet by mouth 2 (two) times daily as needed (Abdominal pain/reflux)  for up to 14 days Disp: 28 tablet Rfl: 0   lidocaine (XYLOCAINE) 2 % solution Take 10 mLs by mouth every 6 (six) hours as needed for Pain Disp: 100 mL Rfl: 0   ranitidine (ZANTAC) 75 MG tablet Take 1 tablet PO at bedtime Disp: 30 tablet Rfl: 2   omeprazole (PRILOSEC) 40 MG capsule Take 1 capsule by mouth twice daily. Take 30 minutes before meals Disp: 30 capsule Rfl: 1   aluminum-magnesium hydroxide (MAALOX) 200-200 mg/5 mL suspension Take 30 mLs by mouth every 6 (six) hours as needed for Indigestion Disp: 300 mL Rfl: 1   sucralfate (CARAFATE) 1 GM/10ML suspension Take 10 mLs by mouth 4 (four) times daily Disp: 1200 mL Rfl: 1   fluticasone (FLONASE) 50 MCG/ACT nasal spray 1 spray by Each Nostril route daily Disp: 1 Bottle Rfl: 11   VOLTAREN 1 % GEL gel apply 4 grams to affected area  twice a day DO NOT EXCEED 32 G IN 1 DAY Disp: 100 g Rfl: 0   albuterol (PROVENTIL) (2.5 MG/3ML) 0.083% nebulizer solution Take 3 mLs by nebulization every 6 (six) hours as needed for Wheezing Disp: 60 mL Rfl: 1   metoprolol (TOPROL-XL) 25 MG 24 hr tablet Take 1 tablet by mouth daily Disp: 90 tablet Rfl: 1   lisinopril (PRINIVIL,ZESTRIL) 20 MG tablet take 1 tablet by mouth once daily Disp: 90 tablet Rfl: 3   ONETOUCH  DELICA LANCETS FINE MISC TEST once daily Disp: 100 each Rfl: 11   metFORMIN (GLUCOPHAGE-XR) 500 MG 24 hr tablet take 4 tablets by mouth once daily WITH BREAKFAST Disp: 360 tablet Rfl: 3   atorvastatin (LIPITOR) 20 MG tablet take 1 tablet by mouth once daily Disp: 90 tablet Rfl: 3   ONETOUCH VERIO test strip TEST once daily Disp: 100 strip Rfl: 11   albuterol (PROVENTIL HFA,VENTOLIN HFA, PROAIR HFA) 108 (90 Base) MCG/ACT inhaler Inhale 2 puffs into the lungs every 6 (six) hours as needed for Wheezing or Shortness of breath Disp: 1 Inhaler Rfl: 11   Omega-3 Fatty Acids (FISH OIL) 1000 MG CAPS Take by mouth Disp:  Rfl:    cholecalciferol (VITAMIN D3) 1000 UNIT tablet Take 1 tablet by mouth daily Disp:  Rfl:    pregabalin (LYRICA) 100 MG capsule Take 100 mg by mouth 2 (two) times daily With Dr Dwyane Dee Disp:  Rfl:    oxycodone-acetaminophen (PERCOCET) 5-325 MG per tablet Take 1 tablet by mouth 2 (two) times daily as needed With Dr Dwyane Dee Disp:  Rfl:    aspirin 81 MG tablet Take 81 mg by mouth daily. Disp:  Rfl:          Social History:   Social History     Socioeconomic History    Marital status: Divorced     Spouse name: Not on file    Number of children: Not on file    Years of education: Not on file    Highest education level: Not on file   Occupational History    Not on file   Social Needs    Financial resource strain: Not on file    Food insecurity:     Worry: Not on file     Inability: Not on file    Transportation needs:     Medical: Not on file     Non-medical: Not on file   Tobacco Use    Smoking status: Former Smoker     Packs/day: 0.50     Years: 31.00     Pack years: 15.50     Types: Cigarettes     Last attempt to quit: 03/10/2018     Years since quitting: 0.2    Smokeless tobacco: Never Used    Tobacco comment: quit smoking inform provided to patient   Substance and Sexual Activity    Alcohol use: No    Drug use: No    Sexual activity: Not Currently     Partners: Male     Birth control/protection:  Tubal Ligation   Lifestyle    Physical activity:     Days per week: Not on file     Minutes per session: Not on file    Stress: Not on file   Relationships    Social connections:     Talks on phone: Not on file     Gets together: Not on file     Attends religious service: Not on file  Active member of club or organization: Not on file     Attends meetings of clubs or organizations: Not on file     Relationship status: Not on file    Intimate partner violence:     Fear of current or ex partner: Not on file     Emotionally abused: Not on file     Physically abused: Not on file     Forced sexual activity: Not on file   Other Topics Concern    Not on file   Social History Narrative    Lives with son    1 daughter and another son passed away    Disabled from back    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 Simons,MD, 07/15/2015, 2:56 PM                Allergies:  Review of Patient's Allergies indicates:   Codeine camsylate       Shortness of Breath, Other (See                            Comments)    Comment:Chest pain   Motrin [ibuprofen]      Nausea Only   Sulfa antibiotics       Other (See Comments)   Bactrim                 Rash, Itching      Physical Exam:  BP 116/65  Pulse 71  Temp 97.3 F  Resp 18  Wt 104.3 kg (230 lb)  LMP 10/22/2007  SpO2 95%  BMI 43.71 kg/m2    GENERAL: No acute distress.   SKIN:  Warm & Dry, no rash.  HEAD:  NCAT. Sclerae are anicteric and aninjected, oropharynx is clear with moist mucous membranes.     NECK:  Supple.  No meningismus.    LUNGS:  Clear to auscultation bilaterally.   HEART:  RRR.   ABDOMEN:  Soft, NTND.  No involuntary guarding or rebound.   EXTREMITIES:  No obvious deformities.   NEUROLOGIC:  Alert. Moves all extremities well. Speaking clearly.   PSYCHIATRIC:   Appropriate for age, time of day, and situation        ED Course and Medical Decision-making:  This 62 year old female patient presents with generalized abdominal pain radiating up into her chest.      Abdomen is completely benign.      EKG shows normal sinus rhythm with no ST or T wave changes.      On review of recent records patient had a CT scan on July 3 which was concerning for possible pancreatic lesion.  Patient has MRCP scheduled for further evaluation.    Work-up reassuring.  I doubt ACS, pancreatitis, bowel obstruction, perforation.    Symptoms improved somewhat with GI cocktail.  Patient has outpatient follow-up.    Condition: Stable  Disposition: Discharge      Diagnosis/Diagnoses:  Abdominal pain, chest pain    Azalia Bilis, MD

## 2018-05-25 NOTE — Progress Notes (Signed)
Paged by patient approximately 3:20 pm  She reports epigastric burning.  She wants to know why she has this.  She has taken all PCP has recommended but symptoms persist.  She now has burning radiating up to chest.  No Nausea/vomiting but patient is not really answering my questions about her symptoms and keeps asking why she has this burning.  She seems to think it is related to contrast she drank when in ER for CT scan.  Review of chart indicates ER and PCP visit for similar problem and evaluation suggestive of GI cause as well as anxiety.  As far as I can tell patient indicates that symptoms are different or somehow worse than they were and now are in chest.  Given her hx of DM and that she is a poor historian, I advised further evaluation in Er.  She has no one there to take her.  Advised she go by ambulance.  She agreed with plan.    Advised would notify PCP.  Janann August, MD, 05/25/2018

## 2018-05-25 NOTE — Narrator Note (Signed)
Patient Disposition    Patient education for diagnosis, medications, activity, diet and follow-up.  Patient left ED 7:54 PM.  Patient rep received written instructions.  Interpreter to provide instructions: No    Patient belongings with patient: YES    Have all existing LDAs been addressed? Yes    Have all IV infusions been stopped? N/A    Discharged to: Discharged to home. Verbal and written instructions given. Patient understands plan of care and reasons for returning to the ED. Encouraged patient to follow up with Gastroenterology.

## 2018-05-25 NOTE — Narrator Note (Signed)
Patient states that burning is less at this time.  Education given regarding what foods to avoid.

## 2018-05-25 NOTE — ED Notes (Signed)
Bed: 04-A  Expected date:   Expected time:   Means of arrival:   Comments:  Ems

## 2018-05-26 ENCOUNTER — Other Ambulatory Visit (HOSPITAL_BASED_OUTPATIENT_CLINIC_OR_DEPARTMENT_OTHER): Payer: Self-pay | Admitting: Internal Medicine

## 2018-05-26 ENCOUNTER — Encounter (HOSPITAL_BASED_OUTPATIENT_CLINIC_OR_DEPARTMENT_OTHER): Payer: Self-pay | Admitting: Physician Assistant

## 2018-05-26 ENCOUNTER — Other Ambulatory Visit (HOSPITAL_BASED_OUTPATIENT_CLINIC_OR_DEPARTMENT_OTHER): Payer: Self-pay | Admitting: Social Worker

## 2018-05-26 DIAGNOSIS — K2970 Gastritis, unspecified, without bleeding: Secondary | ICD-10-CM

## 2018-05-26 DIAGNOSIS — K219 Gastro-esophageal reflux disease without esophagitis: Secondary | ICD-10-CM

## 2018-05-26 MED ORDER — DIAZEPAM 5 MG PO TABS
5.00 mg | ORAL_TABLET | Freq: Once | ORAL | 0 refills | Status: AC
Start: 2018-05-26 — End: 2018-05-26

## 2018-05-26 MED ORDER — LIDOCAINE VISCOUS 2 % MT SOLN: 10 mL | mL | Freq: Four times a day (QID) | 0 refills | 0 days | Status: DC | PRN

## 2018-05-26 MED ORDER — ALUMINUM & MAGNESIUM HYDROXIDE 200-200 MG/5ML PO SUSP: 30 mL | mL | Freq: Four times a day (QID) | ORAL | 1 refills | 0 days | Status: DC | PRN

## 2018-05-26 MED ORDER — DIAZEPAM 5 MG PO TABS: 5 mg | tablet | Freq: Once | ORAL | 0 refills | 0 days | Status: AC

## 2018-05-26 MED ORDER — LIDOCAINE VISCOUS 2 % MT SOLN
10.0000 mL | Freq: Four times a day (QID) | OROMUCOSAL | 0 refills | Status: DC | PRN
Start: 2018-05-26 — End: 2018-05-31

## 2018-05-26 MED ORDER — ALUMINUM & MAGNESIUM HYDROXIDE 200-200 MG/5ML PO SUSP
30.0000 mL | Freq: Four times a day (QID) | ORAL | 1 refills | Status: DC | PRN
Start: 2018-05-26 — End: 2018-06-14

## 2018-05-26 NOTE — Progress Notes (Signed)
Called to update dtr and patient   Exams scheduled and moved up  Ask for benzo for MRI - will be sent  Ramzi Brathwaite P. Bradey Luzier,MD, 05/26/2018

## 2018-05-26 NOTE — Progress Notes (Unsigned)
Call pharmacy and Samantha Cameron stated that they still waiting since it takes 45 minutes for them to receive e-script prescriptions called at 3:55 pm and rx was send at 3:35 PM

## 2018-05-26 NOTE — Progress Notes (Signed)
PER Pharmacy, Samantha Cameron is a 62 year old female has requested a refill of Maalox and Lidocaine.      Last Office Visit: 05/23/18 with Lenore Cordia  Last Physical Exam: 08/15/15    There are no preventive care reminders to display for this patient.    Other Med Adult:  Most Recent BP Reading(s)  05/25/18 : 114/66        Cholesterol (mg/dL)   Date Value   10/28/2017 131     LOW DENSITY LIPOPROTEIN DIRECT (mg/dL)   Date Value   10/28/2017 61     HIGH DENSITY LIPOPROTEIN (mg/dL)   Date Value   10/28/2017 47     TRIGLYCERIDES (mg/dL)   Date Value   10/28/2017 240 (H)         THYROID SCREEN TSH REFLEX FT4 (uIU/mL)   Date Value   05/14/2018 1.990         No results found for: TSH    HEMOGLOBIN A1C (%)   Date Value   05/14/2018 6.3 (H)       No results found for: POCA1C      INR (no units)   Date Value   04/18/2017 1.0   05/27/2016 1.0   02/01/2012 < 1.0 (L)       SODIUM (mmol/L)   Date Value   05/25/2018 141       POTASSIUM (mmol/L)   Date Value   05/25/2018 4.3           CREATININE (mg/dL)   Date Value   05/25/2018 0.7       Documented patient preferred pharmacies:    [facility] - [address], [place]  Phone: [phone] Fax: [phone]

## 2018-05-26 NOTE — Progress Notes (Signed)
Thank you for your referral. We were able to contact your patient. Samantha Cameron was given an appointment for 07/01/18 with Luella Cook at [facility].

## 2018-05-26 NOTE — Addendum Note (Signed)
Addended by: Driscilla Grammes. on: 05/26/2018 05:01 PM     Modules accepted: Orders

## 2018-05-28 LAB — EKG

## 2018-05-31 ENCOUNTER — Other Ambulatory Visit (HOSPITAL_BASED_OUTPATIENT_CLINIC_OR_DEPARTMENT_OTHER): Payer: Self-pay | Admitting: Internal Medicine

## 2018-05-31 DIAGNOSIS — K219 Gastro-esophageal reflux disease without esophagitis: Secondary | ICD-10-CM

## 2018-05-31 DIAGNOSIS — K2970 Gastritis, unspecified, without bleeding: Secondary | ICD-10-CM

## 2018-06-01 ENCOUNTER — Other Ambulatory Visit (HOSPITAL_BASED_OUTPATIENT_CLINIC_OR_DEPARTMENT_OTHER): Payer: Self-pay | Admitting: Internal Medicine

## 2018-06-01 DIAGNOSIS — K2970 Gastritis, unspecified, without bleeding: Secondary | ICD-10-CM

## 2018-06-01 DIAGNOSIS — K219 Gastro-esophageal reflux disease without esophagitis: Secondary | ICD-10-CM

## 2018-06-01 NOTE — Progress Notes (Signed)
PER Pharmacy, Samantha Cameron is a 62 year old female has requested a refill of lidocaine visc.      Last Office Visit: 05/23/2018 with Osa Craver   Last Physical Exam: 08/14/2017    COLONOSCOPY due on 09/01/2018    Other Med Adult:  Most Recent BP Reading(s)  05/25/18 : 114/66        Cholesterol (mg/dL)   Date Value   10/28/2017 131     LOW DENSITY LIPOPROTEIN DIRECT (mg/dL)   Date Value   10/28/2017 61     HIGH DENSITY LIPOPROTEIN (mg/dL)   Date Value   10/28/2017 47     TRIGLYCERIDES (mg/dL)   Date Value   10/28/2017 240 (H)         THYROID SCREEN TSH REFLEX FT4 (uIU/mL)   Date Value   05/14/2018 1.990         No results found for: TSH    HEMOGLOBIN A1C (%)   Date Value   05/14/2018 6.3 (H)       No results found for: POCA1C      INR (no units)   Date Value   04/18/2017 1.0   05/27/2016 1.0   02/01/2012 < 1.0 (L)       SODIUM (mmol/L)   Date Value   05/25/2018 141       POTASSIUM (mmol/L)   Date Value   05/25/2018 4.3           CREATININE (mg/dL)   Date Value   05/25/2018 0.7       Documented patient preferred pharmacies:    [facility] - [address], [place]  Phone: [phone] Fax: [phone]

## 2018-06-02 ENCOUNTER — Emergency Department
Admission: AD | Admit: 2018-06-02 | Discharge: 2018-06-02 | Disposition: A | Discharge status: Home / Self Care | Payer: Medicare Other | Source: Intra-hospital | Attending: Emergency Medicine | Admitting: Emergency Medicine

## 2018-06-02 ENCOUNTER — Encounter (HOSPITAL_BASED_OUTPATIENT_CLINIC_OR_DEPARTMENT_OTHER): Payer: Self-pay

## 2018-06-02 DIAGNOSIS — R1013 Epigastric pain: Secondary | ICD-10-CM | POA: Diagnosis present

## 2018-06-02 DIAGNOSIS — E119 Type 2 diabetes mellitus without complications: Secondary | ICD-10-CM | POA: Diagnosis not present

## 2018-06-02 DIAGNOSIS — Z7984 Long term (current) use of oral hypoglycemic drugs: Secondary | ICD-10-CM

## 2018-06-02 DIAGNOSIS — R110 Nausea: Secondary | ICD-10-CM | POA: Diagnosis not present

## 2018-06-02 DIAGNOSIS — I10 Essential (primary) hypertension: Secondary | ICD-10-CM

## 2018-06-02 DIAGNOSIS — Z87891 Personal history of nicotine dependence: Secondary | ICD-10-CM | POA: Diagnosis not present

## 2018-06-02 LAB — CBC, PLATELET & DIFFERENTIAL
ABSOLUTE BASO COUNT: 0 10*3/uL (ref 0.0–0.1)
ABSOLUTE EOSINOPHIL COUNT: 0.5 10*3/uL (ref 0.0–0.8)
ABSOLUTE IMM GRAN COUNT: 0.01 10*3/uL (ref 0.00–0.03)
ABSOLUTE LYMPH COUNT: 3.7 10*3/uL (ref 0.6–5.9)
ABSOLUTE MONO COUNT: 1 10*3/uL (ref 0.2–1.4)
ABSOLUTE NEUTROPHIL COUNT: 6.2 10*3/uL (ref 1.6–8.3)
BASOPHIL %: 0.3 % (ref 0.0–1.2)
EOSINOPHIL %: 4.6 % (ref 0.0–7.0)
HEMATOCRIT: 40.9 % (ref 34.1–44.9)
HEMOGLOBIN: 13.2 g/dL (ref 11.2–15.7)
IMMATURE GRANULOCYTE %: 0.1 % (ref 0.0–0.4)
LYMPHOCYTE %: 32.7 % (ref 15.0–54.0)
MEAN CORP HGB CONC: 32.3 g/dL (ref 31.0–37.0)
MEAN CORPUSCULAR HGB: 29.4 pg (ref 26.0–34.0)
MEAN CORPUSCULAR VOL: 91.1 fL (ref 80.0–100.0)
MEAN PLATELET VOLUME: 10.8 fL (ref 8.7–12.5)
MONOCYTE %: 8.4 % (ref 4.0–13.0)
NEUTROPHIL %: 53.9 % (ref 40.0–75.0)
PLATELET COUNT: 331 10*3/uL (ref 150–400)
RBC DISTRIBUTION WIDTH STD DEV: 45.7 fL (ref 35.1–46.3)
RBC DISTRIBUTION WIDTH: 13.8 % (ref 11.5–14.3)
RED BLOOD CELL COUNT: 4.49 M/uL (ref 3.90–5.20)
WHITE BLOOD CELL COUNT: 11.4 10*3/uL — ABNORMAL HIGH (ref 4.0–11.0)

## 2018-06-02 LAB — COMPREHENSIVE METABOLIC PANEL
ALANINE AMINOTRANSFERASE: 32 U/L (ref 12–45)
ALBUMIN: 4.1 g/dL (ref 3.4–5.0)
ALKALINE PHOSPHATASE: 92 U/L (ref 45–117)
ANION GAP: 12 mmol/L (ref 5–15)
ASPARTATE AMINOTRANSFERASE: 21 U/L (ref 8–34)
BILIRUBIN TOTAL: 0.2 mg/dL (ref 0.2–1.0)
BUN (UREA NITROGEN): 20 mg/dL — ABNORMAL HIGH (ref 7–18)
CALCIUM: 9.5 mg/dL (ref 8.5–10.1)
CARBON DIOXIDE: 27 mmol/L (ref 21–32)
CHLORIDE: 100 mmol/L (ref 98–107)
CREATININE: 0.9 mg/dL (ref 0.4–1.2)
ESTIMATED GLOMERULAR FILT RATE: >60 mL/min (ref >60)
Glucose Random: 98 mg/dL (ref 74–160)
POTASSIUM: 3.7 mmol/L (ref 3.5–5.1)
SODIUM: 139 mmol/L (ref 136–145)
TOTAL PROTEIN: 7.6 g/dL (ref 6.4–8.2)

## 2018-06-02 LAB — MAGNESIUM: MAGNESIUM: 1.9 mg/dL (ref 1.8–2.4)

## 2018-06-02 LAB — LIPASE: LIPASE: 237 U/L (ref 73–393)

## 2018-06-02 LAB — TROPONIN I: TROPONIN I: <0.02 ng/mL (ref 0.00–0.04)

## 2018-06-02 LAB — LACTIC ACID: LACTIC ACID: 2.2 mmol/L — ABNORMAL HIGH (ref 0.4–2.0)

## 2018-06-02 MED ORDER — SODIUM CHLORIDE 0.9 % IV BOLUS
1000.0000 mL | Freq: Once | INTRAVENOUS | Status: AC
Start: 2018-06-02 — End: 2018-06-02
  Administered 2018-06-02: 1000 mL via INTRAVENOUS

## 2018-06-02 MED ORDER — FAMOTIDINE 20 MG/2ML IV SOLN
20.0000 mg | Freq: Once | INTRAVENOUS | Status: AC
Start: 2018-06-02 — End: 2018-06-02
  Administered 2018-06-02: 20 mg via INTRAVENOUS
  Filled 2018-06-02: qty 2

## 2018-06-02 MED ORDER — METOCLOPRAMIDE HCL 10 MG PO TABS: 10 mg | tablet | Freq: Four times a day (QID) | ORAL | 0 refills | 0 days | Status: DC | PRN

## 2018-06-02 MED ORDER — KETOROLAC TROMETHAMINE 15 MG/ML IJ SOLN
15.0000 mg | Freq: Once | INTRAMUSCULAR | Status: AC
Start: 2018-06-02 — End: 2018-06-02
  Administered 2018-06-02: 15 mg via INTRAVENOUS
  Filled 2018-06-02: qty 1

## 2018-06-02 MED ORDER — ALUMINUM & MAGNESIUM HYDROXIDE 200-200 MG/5ML PO SUSP
30.0000 mL | Freq: Once | ORAL | Status: AC
Start: 2018-06-02 — End: 2018-06-02
  Administered 2018-06-02: 30 mL via ORAL
  Filled 2018-06-02: qty 30

## 2018-06-02 MED ORDER — METOCLOPRAMIDE HCL 10 MG PO TABS
10.0000 mg | ORAL_TABLET | Freq: Four times a day (QID) | ORAL | 0 refills | Status: DC | PRN
Start: 2018-06-02 — End: 2018-06-05

## 2018-06-02 MED ORDER — LIDOCAINE VISCOUS 2 % MT SOLN
10.0000 mL | Freq: Once | OROMUCOSAL | Status: AC
Start: 2018-06-02 — End: 2018-06-02
  Administered 2018-06-02: 10 mL via OROMUCOSAL
  Filled 2018-06-02: qty 15

## 2018-06-02 MED ORDER — METOCLOPRAMIDE HCL 5 MG/ML IJ SOLN
10.00 mg | Freq: Once | INTRAMUSCULAR | Status: AC
Start: 2018-06-02 — End: 2018-06-02
  Administered 2018-06-02: 10 mg via INTRAVENOUS
  Filled 2018-06-02: qty 2

## 2018-06-02 NOTE — ED Provider Notes (Signed)
The patient was seen primarily by me. ED nursing record was reviewed. Select prior records as available electronically through the Epic record were reviewed.      HPI:    Samantha Cameron is a 62 year old female patient with h/o DM, HTN, gastritis, who presents with epigastric abdominal pain that started overnight.  She has had several episodes of similar pain for the last few weeks.  She is had several ER visits for this, including a CT scan about 10 days ago.  She has an MRCP scheduled in the future, and follow-up with her doctors.  She had been feeling okay until last night when this pain started up again.  It is similar to prior episodes.  It is primarily in the epigastrium, feeling her belly is bloated, with nausea, but no vomiting.  No fevers or chills.    ROS: Pertinent positives were reviewed as per the HPI above. All other systems were reviewed and are negative.  [name]  Language of care: English  MRN: [id number]  PCP: Suann Larry. Simons,MD  Mode of arrival to ED: Self.  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) ([place])  09/14/2015: Cough  No date: Depression      Comment:  per steward records  No date: Diabetes  No date: Disorders of lipoid metabolism  No date: Esophageal  reflux  No date: External hemorrhoid      Comment:  per steward records  No date: HTN (hypertension)  02/01/2012: Hypoxia  02/16/2017: Left ear pain  06/07/2015: Left genital labial abscess  06/16/2015: Obesity, Class III, 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y) ([place])  08/16/2015: Osteopenia      Comment:  On xray of left knee per steward records 06/2014 Unclear                if addressed  09/14/2015: Right ear impacted cerumen  08/15/2015: Type 2 diabetes mellitus without complication, without   long-term current use of insulin (HCC)      Comment:   HEMOGLOBIN A1C (%) Date Value 07/15/2015 6.2 (H)                ----------  Steward record - a1c 6.2 on 01/31/15 6.4 on                09/30/14 5.9 on 12/29/13 6.4 08/18/13   11/18/2017: Viral URI with cough  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) ([place])  Tobacco use disorder     Spinal stenosis of lumbar region     HLD (hyperlipidemia)     Chronic bilateral low back pain with bilateral sciatica     Skin change     Pineal gland cyst     History of vitamin D deficiency     Hemorrhoids     Venous (peripheral) insufficiency     Adrenal adenoma     Type 2 diabetes mellitus with microalbuminuria, without long-term current use of insulin (HCC)     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 (HCC)     Acute pain of right wrist     Abnormal PFTs     Screening for malignant neoplasm of breast     Skin irritation     H/O bone density study     Vertigo     Orthostatic hypotension     Social problem     Right arm weakness     Insomnia     Acute pain of left knee     Hx of total knee replacement, left     Acute bacterial sinusitis     Seasonal allergic rhinitis     Left foot pain     Stress due to family tension     Gastritis without bleeding     Leucocytosis     Gastroesophageal reflux disease without esophagitis      HOSP-TO-HOME COMMUNITY COLLABORATION PROGRAM     Epigastric pain     Hx of gastritis     Common bile duct dilation      Fatty liver     Pancreatic lesion     Vascular calcification     Umbilical hernia without obstruction and without gangrene     Arthritis of spine     Scoliosis of thoracic spine     Anxiety     Sleep disturbance    Past Surgical History: Past Surgical History:  No date: ANES NERVE MUSC TENDON FASCIA&BURSA KNEE&/POPLT  No date: FOOT SURGERY      Comment:  bilateral  No date: LAPAROSCOPY SURG CHOLECYSTECTOMY  No date: OB ANTEPARTUM CARE CESAREAN DLVR & POSTPARTUM      Comment:  x3  No date: TONSILLECTOMY & ADENOIDECTOMY <AGE 30  04/13/2009: TOTAL KNEE REPLACEMENT      Comment:  left  08/11/2009: WRIST GANGLION EXCISION      Comment:  left   Social History:   Social History     Socioeconomic History   . Marital status: Divorced     Spouse name: Not on file   . Number of children: Not on file   . Years of education: Not on file   . Highest education level: Not on file   Occupational History   . Not on file   Social Needs   . Financial resource strain: Not on file   . Food insecurity:     Worry: Not on file     Inability: Not on file   . Transportation needs:     Medical: Not on file     Non-medical: Not on file   Tobacco Use   . Smoking status: Former Smoker     Packs/day: 0.50     Years: 31.00     Pack years: 15.50     Types: Cigarettes     Last attempt to quit: 03/10/2018     Years since quitting: 0.2   . Smokeless tobacco: Never Used   . Tobacco comment: quit  smoking inform provided to patient   Substance and Sexual Activity   . Alcohol use: No   . Drug use: No   . Sexual activity: Not Currently     Partners: Male     Birth control/protection: Tubal Ligation   Lifestyle   . Physical activity:     Days per week: Not on file     Minutes per session: Not on file   . Stress: Not on file   Relationships   . Social connections:     Talks on phone: Not on file     Gets together: Not on file     Attends religious service: Not on file     Active member of club or organization: Not on file     Attends meetings of clubs or  organizations: Not on file     Relationship status: Not on file   . Intimate partner violence:     Fear of current or ex partner: Not on file     Emotionally abused: Not on file     Physically abused: Not on file     Forced sexual activity: Not on file   Other Topics Concern   . 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 Simons,MD, 07/15/2015, 2:56 PM           Allergies: Review of Patient's Allergies indicates:   Codeine camsylate       Shortness of Breath, Other (See                            Comments)    Comment:Chest pain   Motrin [ibuprofen]      Nausea Only   Sulfa antibiotics       Other (See Comments)   Bactrim                 Rash, Itching    Immunizations:   Immunization History   Administered Date(s) Administered   . HEP B ADULT 3 DOSE 20 and > 08/15/2015, 09/14/2015, 05/17/2016   . INFLUENZA VIRUS TRI W/PRESV VACCINE 18/> YRS IM (PRIVATE) 10/14/2007, 09/17/2008, 09/01/2009, 08/07/2010, 08/14/2011, 09/03/2011, 09/12/2012, 08/18/2013, 08/24/2014, 06/30/2015   . Influenza Virus Quad Presv Free Vacc 3/> Yrs IM 06/30/2015, 12/11/2016, 07/02/2017   . Influenza Virus Quadrivalent Vacc 3/> Yrs Im 06/30/2015, 07/02/2017   . Influenza Virus Tri Presv Free 3/> YRS IM 07/03/2016   . PNEUMOCOCCAL POLYSACCHARIDE VACCINE v23 03/12/2014, 08/15/2015, 12/01/2015   . Pneumococcal Vaccine, Conjugate V7 01/31/2009   . Td 11/01/2001   . Tdap 09/17/2008, 12/11/2016   . ZOSTER SHINGLES VACC, LIVE SC 08/24/2008, 12/01/2015   . Zoster Vacc (HZV) Recomb Adjv, IM, 2 Dose, ([facility]) 05/30/2017, 07/17/2017          Medications:  Prior to Admission Medications   Prescriptions Last Dose Informant Patient  Reported? Taking?   ONETOUCH DELICA LANCETS FINE MISC   No No   Sig: TEST once daily  ONETOUCH VERIO test strip   No No   Sig: TEST once daily   Omega-3 Fatty Acids (FISH OIL) 1000 MG CAPS   Yes No   Sig: Take by mouth   VOLTAREN 1 % GEL gel   No No   Sig: apply 4 grams to affected area twice a day DO NOT EXCEED 32 G IN 1 DAY   albuterol (PROVENTIL HFA,VENTOLIN HFA, PROAIR HFA) 108 (90 Base) MCG/ACT inhaler   No No   Sig: Inhale 2 puffs into the lungs every 6 (six) hours as needed for Wheezing or Shortness of breath   albuterol (PROVENTIL) (2.5 MG/3ML) 0.083% nebulizer solution   No No   Sig: Take 3 mLs by nebulization every 6 (six) hours as needed for Wheezing   aluminum-magnesium hydroxide (MAALOX) 200-200 mg/5 mL suspension   No No   Sig: Take 30 mLs by mouth every 6 (six) hours as needed for Indigestion   aspirin 81 MG tablet   Yes No   Sig: Take 81 mg by mouth daily.   atorvastatin (LIPITOR) 20 MG tablet   No No   Sig: take 1 tablet by mouth once daily   cholecalciferol (VITAMIN D3) 1000 UNIT tablet   Yes No   Sig: Take 1 tablet by mouth daily   famotidine (PEPCID) 20 MG tablet   No No   Sig: Take 1 tablet by mouth 2 (two) times daily as needed (Abdominal pain/reflux)  for up to 14 days   fluticasone (FLONASE) 50 MCG/ACT nasal spray   No No   Sig: 1 spray by Each Nostril route daily   lidocaine (XYLOCAINE) 2 % solution   No No   Sig: Take 10 mLs by mouth every 6 (six) hours as needed for Pain   lisinopril (PRINIVIL,ZESTRIL) 20 MG tablet   No No   Sig: take 1 tablet by mouth once daily   metFORMIN (GLUCOPHAGE-XR) 500 MG 24 hr tablet   No No   Sig: take 4 tablets by mouth once daily WITH BREAKFAST   metoprolol (TOPROL-XL) 25 MG 24 hr tablet   No No   Sig: Take 1 tablet by mouth daily   omeprazole (PRILOSEC) 40 MG capsule   No No   Sig: Take 1 capsule by mouth twice daily. Take 30 minutes before meals   oxycodone-acetaminophen (PERCOCET) 5-325 MG per tablet   Yes No   Sig: Take 1 tablet by mouth 2 (two) times  daily as needed With Dr Dwyane Dee   pregabalin (LYRICA) 100 MG capsule   Yes No   Sig: Take 100 mg by mouth 2 (two) times daily With Dr Dwyane Dee   ranitidine (ZANTAC) 75 MG tablet   No No   Sig: Take 1 tablet PO at bedtime   sucralfate (CARAFATE) 1 GM/10ML suspension   No No   Sig: Take 10 mLs by mouth 4 (four) times daily      Facility-Administered Medications: None     Physical Exam (ED Bed 08/08-A):   Patient Vitals for the past 99 hrs:   BP Temp Pulse Resp SpO2   06/02/18 0531 102/58 -- 81 -- 92 %   06/02/18 0502 96/50 -- 85 -- 99 %   06/02/18 0438 94/51 -- 81 -- 100 %   06/02/18 0401 95/53 -- 85 -- 92 %   06/02/18 0248 130/55 98.5 F 104 16 94 %     GENERAL:  No acute distress, non-toxic appearance.   SKIN:  Warm & Dry. No  rash, no petechia.  HEAD:  NCAT. Moist mucous membranes.   NECK:  Supple.   CHEST:  Lungs are clear to auscultation bilaterally, without wheezing or rales. No chest wall tenderness.   HEART:  RRR.  No murmurs.   ABDOMEN:  Soft, tender only in the epigastrium, obese. No involuntary guarding or rebound.   EXTREMITIES:  No obvious deformities.  Warm and well perfused.  No cyanosis, no edema.  NEUROLOGIC:  Alert, speaking in clear sentences, and moving all extremities. Normal gait without ataxia.   PSYCHIATRIC:  Appropriate for age, time of day, and situation.    Medications Given in the ED:  Medications   ketorolac (TORADOL) injection 15 mg (15 mg Intravenous Given 06/02/18 0350)   metoclopramide (REGLAN) injection 10 mg (10 mg Intravenous Given 06/02/18 0350)   famotidine (PEPCID) injection 20 mg (20 mg Intravenous Given 06/02/18 0350)   sodium chloride 0.9 % IV bolus 1,000 mL (1,000 mLs Intravenous New Bag 06/02/18 0350)   lidocaine (XYLOCAINE) 2 % viscous solution 10 mL (10 mLs Mouth/Throat Given 06/02/18 0444)   aluminum-magnesium hydroxide (MAALOX) 200-200 mg/5 mL suspension 30 mL (30 mLs Oral Given 06/02/18 0444)    Radiology Studies:  N/A   Lab Results (abnormal results only):  Labs Reviewed   CBC,  PLATELET & DIFFERENTIAL - Abnormal; Notable for the following components:       Result Value    WHITE BLOOD CELL COUNT 11.4 (*)     All other components within normal limits   COMPREHENSIVE METABOLIC PANEL - Abnormal; Notable for the following components:    BUN (UREA NITROGEN) 20 (*)     All other components within normal limits   LACTIC ACID - Abnormal; Notable for the following components:    LACTIC ACID 2.2 (*)     All other components within normal limits    Narrative:     Emergency RN: By default, ED lactic acid orders are  repeated 3 hours later. If the initial value is n  ormal, check with the provider as the repeat may be  canceled.   MAGNESIUM   LIPASE   TROPONIN I    Other Results (e.g. ECG):  N/A     ED Course and Medical Decision-making:  62 year old female patient with continued abdominal pain.  She was treated with Toradol, antacids, and metoclopramide with improvement in the symptoms.  This may be due to continued gastritis or stomach upset, though could also be related to gastroparesis, as it is in the epigastrium, and was relieved a bit with metoclopramide, and she has diabetes.  She is given a prescription for this, with instructions to continue with outpatient plan and follow-up.  May return if symptoms worsen.    Patient/family educated on diagnosis(es); she states understanding and agrees with plan of care.  Reasons to return to the ED were reviewed in detail. She agrees with this plan and disposition.    Condition on Discharge: Improved and Stable    Diagnosis/Diagnoses:  Epigastric pain  Nausea      Olam Idler, MD  Attending Physician  [facility] Department of Emergency Medicine    This Emergency Department patient encounter note was created using voice-recognition software and in real time during the ED visit.

## 2018-06-02 NOTE — Narrator Note (Signed)
Patient Disposition    Patient education for diagnosis, medications, activity, diet and follow-up.  Patient left ED 6:41 AM.  Patient rep received written instructions.  Interpreter to provide instructions: No    Patient belongings with patient: YES    Have all existing LDAs been addressed? Yes    Have all IV infusions been stopped? Yes    Discharged to: Discharged to home    Pt. Verbalized understanding of all discharge and follow up care. Pt NAD Aox4 ambulated with steady gait and all belongings.

## 2018-06-02 NOTE — ED Triage Note (Signed)
Pt self presents with sharp and constant generalized pain starting 15 minutes pta. States she "spit" blood x2. Recently dx with 3 pancreatic lesions and GERD. NAD AOx4

## 2018-06-02 NOTE — Discharge Instructions (Signed)
The blood test today appear completely normal.  You are given stomach acid medicines and nausea medicines with improvement in your symptoms.  You are also given a medicine called Reglan or metoclopramide.  This is a medicine that helps empty the stomach and treat the nausea, in case this is a condition called gastroparesis, or delayed stomach emptying.  Follow-up with your primary care doctor for further work-up and treatment of this abdominal pain.

## 2018-06-02 NOTE — Progress Notes (Signed)
Duplicate request

## 2018-06-02 NOTE — Narrator Note (Signed)
MD Junious Silk made aware of BP. NAD states she is feeling a little better

## 2018-06-03 DIAGNOSIS — M5417 Radiculopathy, lumbosacral region: Secondary | ICD-10-CM | POA: Diagnosis not present

## 2018-06-03 DIAGNOSIS — G8929 Other chronic pain: Secondary | ICD-10-CM | POA: Diagnosis not present

## 2018-06-03 DIAGNOSIS — M47817 Spondylosis without myelopathy or radiculopathy, lumbosacral region: Secondary | ICD-10-CM | POA: Diagnosis not present

## 2018-06-04 ENCOUNTER — Telehealth (HOSPITAL_BASED_OUTPATIENT_CLINIC_OR_DEPARTMENT_OTHER): Payer: Self-pay | Admitting: Case Management

## 2018-06-04 NOTE — Telephone Encounter (Signed)
-----  Message from [facility] sent at 06/04/2018 11:18 AM EDT -----  Hello, can you please call the patient to see how they feel after ED visit and schedule a follow up. Thank you! Dan Europe

## 2018-06-04 NOTE — Progress Notes (Signed)
Call to patient to follow-up re ED 7/16  Patient reports she went to ED as she had epigastric abdominal pain which she has had over past few weeks.  Had CT scan 10 days ago   Scheduled for MRI 7/22   She feels stomach "bloated" with mild nausea, no vomiting, no fevers or chills.   She does report that she cannot sleep as if she lies down epigastric "burning" is worse.    Reports she is exhausted.  Eating very little.   Given metoclopramide with some relief.    Appointment scheduled with PCP re symptoms and no relief.

## 2018-06-05 ENCOUNTER — Encounter (HOSPITAL_BASED_OUTPATIENT_CLINIC_OR_DEPARTMENT_OTHER): Payer: Self-pay | Admitting: Internal Medicine

## 2018-06-05 ENCOUNTER — Telehealth (HOSPITAL_BASED_OUTPATIENT_CLINIC_OR_DEPARTMENT_OTHER): Payer: Self-pay

## 2018-06-05 ENCOUNTER — Ambulatory Visit: Payer: Medicare Other | Attending: Internal Medicine | Admitting: Internal Medicine

## 2018-06-05 VITALS — BP 107/64 | HR 96 | Ht 60.83 in | Wt 226.0 lb

## 2018-06-05 DIAGNOSIS — G479 Sleep disorder, unspecified: Secondary | ICD-10-CM | POA: Diagnosis not present

## 2018-06-05 DIAGNOSIS — K2970 Gastritis, unspecified, without bleeding: Secondary | ICD-10-CM

## 2018-06-05 DIAGNOSIS — F419 Anxiety disorder, unspecified: Secondary | ICD-10-CM

## 2018-06-05 DIAGNOSIS — K219 Gastro-esophageal reflux disease without esophagitis: Secondary | ICD-10-CM | POA: Diagnosis present

## 2018-06-05 MED ORDER — LIDOCAINE VISCOUS 2 % MT SOLN: 10 mL | mL | 2 refills | 0 days | Status: DC | PRN

## 2018-06-05 MED ORDER — LIDOCAINE VISCOUS 2 % MT SOLN
10.0000 mL | OROMUCOSAL | 2 refills | Status: DC | PRN
Start: 2018-06-05 — End: 2018-06-19

## 2018-06-05 MED ORDER — METOCLOPRAMIDE HCL 10 MG PO TABS
10.00 mg | ORAL_TABLET | Freq: Four times a day (QID) | ORAL | 1 refills | Status: AC | PRN
Start: 2018-06-05 — End: 2018-07-05

## 2018-06-05 MED ORDER — METOCLOPRAMIDE HCL 10 MG PO TABS: 10 mg | tablet | Freq: Four times a day (QID) | ORAL | 1 refills | 0 days | Status: AC | PRN

## 2018-06-05 NOTE — Progress Notes (Signed)
Member Home:  [id number] [phone]  Treating Location:  [id number] [facility] [place]     Member Home:  [id number] [address] [phone]  Treating Location:  [phone] [place] [phone]

## 2018-06-05 NOTE — Progress Notes (Signed)
Prior authorization request for sucralfate liquid 1g/42ml 52ml by mouth tid with meals and at bedtime  Clinical Indication: gastritis, GERD  Previous medications tried: ranitidine, omeprazole, maalox, mylanta, sucralfate tablets (hard to swallow), reglan, pepcid, lidocaine  Diagnostic testing information: patient has been to ED several times for this pain - needs to have liquid approved for symptom relief. Plan for endoscopy 06/20/18

## 2018-06-05 NOTE — Progress Notes (Signed)
Samantha Cameron is a 62 year old female here for follow up emergency department - abdominal pain.     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) ([place])     Tobacco use disorder     Spinal stenosis of lumbar region     HLD (hyperlipidemia)     Chronic bilateral low back pain with bilateral sciatica     Skin change     Pineal gland cyst     History of vitamin D deficiency     Hemorrhoids     Venous (peripheral) insufficiency     Adrenal adenoma     Type 2 diabetes mellitus with microalbuminuria, without long-term current use of insulin (HCC)     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 (HCC)     Acute pain of right wrist     Abnormal PFTs     Screening for malignant neoplasm of breast     Skin irritation     H/O bone density study     Vertigo     Orthostatic hypotension     Social problem     Right arm weakness     Insomnia     Acute pain of left knee     Hx of total knee replacement, left     Acute bacterial sinusitis     Seasonal allergic rhinitis     Left foot pain     Stress due to family tension     Gastritis without bleeding     Leucocytosis     Gastroesophageal reflux disease without esophagitis      HOSP-TO-HOME COMMUNITY COLLABORATION PROGRAM     Epigastric pain     Hx of gastritis     Common bile duct dilation     Fatty liver     Pancreatic lesion     Vascular calcification     Umbilical hernia without obstruction and without gangrene     Arthritis of spine     Scoliosis of thoracic spine     Anxiety     Sleep disturbance    ED visits again 05/25/18 and 06/02/18 for worse upper abdominal pain.  Pt reports that she was given three medications through IV the last ED visit. Prescription for reglan for possible gastroparesis. Taking reglan which helps her nausea.   Pt says the pain level has remained unchanged since the ED visit. 9/10 pain. Hospital pain is different than today - pt feels a burning sensation today.     Patient notes that before all  her abdominal pain started - felt bloated and gassy a month before, but she did not think much of it.      Pt reports she cannot sleep and is up all night because of the pain. She props herself up with 4-5 pillows and feels a burning sensation that comes up - leads to anxiety.     Taking ranitidine, omeprazole, maalox, lidocaine  Not taking sucralfate - ran out of pills, liquid has not been approved by insurance    Most Recent BP Reading(s)  06/05/18 : 107/64  06/02/18 : (!) 109/47  05/25/18 : 114/66  05/23/18 : 100/50  05/22/18 : 101/51    Most Recent Weight Reading(s)  06/05/18 : 102.5 kg (226 lb)  05/25/18 : 104.3 kg (230 lb)  05/23/18 : 103.9 kg (229 lb)  05/22/18 : 104.3 kg (230 lb)  05/19/18 : 104.3 kg (230 lb)  PE:  BP 107/64  Pulse 96  Ht 5' 0.83" (1.545 m)  Wt 102.5 kg (226 lb)  LMP 10/22/2007 (LMP Unknown)  SpO2 97%  BMI 42.95 kg/m2  Estimated body mass index is 42.95 kg/m as calculated from the following:    Height as of this encounter: 5' 0.83" (1.545 m).    Weight as of this encounter: 102.5 kg (226 lb).  Gen - Middle aged morbidly obese female upright in chair in NAD  HEENT - MMM  CV - RRR, no m/r/g  Resp - CTAB  Abd - not re-examined todau   Ext - Warm, dry  Neuro - A+Ox3  Psych - Anxious at times    A/P:  (K29.70) Gastritis without bleeding, unspecified chronicity, unspecified gastritis type  (primary encounter diagnosis)   (K21.9) Gastroesophageal reflux disease without esophagitis  (G47.9) Sleep disturbance  (F41.9) Anxiety  Comment: Ongoing reportedly severe upper abdominal pain - suspicious for gastritis vs other upper GI pathology.  Plan: lidocaine (XYLOCAINE) 2 % solution,         metoclopramide (REGLAN) 10 MG tablet  MRCP scheduled for further eval of CBD dilation and pancreas lesions  Upper endoscopy scheduled for above  Plan: Sleep study scheduled given daytime somnolence   Plan: Therapy appointment scheduled given anxiety  Prior authorization process started for liquid  sucralfate  Continue rest of GI regimen  Return to ED if pain becomes more severe, otherwise try symptomatic meds at home for mild-moderate pain    (E66.01) 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 (HCC)  Comment: By BMI  Plan: Discussed portion control, healthy food choice, exercise    By signing my name below, I, Tilden Dome, attest that this documentation has been prepared under the direction and in the presence of Osa Craver, M.D.  Electronically Signed: Tilden Dome, Medical Scribe. 06/05/18 3:28 PM    I, Osa Craver, personally performed the services described in this documentation. All medical record entries made by the scribe were at my direction and in my presence. I have reviewed the chart and discharge instructions (if applicable) and agree that the record reflects my personal performance and is accurate and complete.   Osa Craver, MD

## 2018-06-06 ENCOUNTER — Telehealth (HOSPITAL_BASED_OUTPATIENT_CLINIC_OR_DEPARTMENT_OTHER): Payer: Self-pay | Admitting: Internal Medicine

## 2018-06-06 NOTE — Progress Notes (Signed)
Patient has [employer]  for prescription coverage.  Member ID # [id number]  PA Phone #: [phone]   Spoke with [place]     Prior authorization was done verbally over the phone. Waiting for the insurance company to send via fax an approval/denial notice. It may take between 24 - 72 hours.     PA #: [id number]

## 2018-06-06 NOTE — Progress Notes (Signed)
Prior authorization request for sucralfate (CARAFATE) 1 GM/10ML suspension        Patient has [employer] for prescription coverage.  I.D.# [id number]  Phone number: [phone]

## 2018-06-06 NOTE — Progress Notes (Signed)
Paged with 2 phone numbers, regarding needing Maalox  Called pt phone # no answer [phone]  [facility] pharmacy #, which was included in the page, [phone], pharmacist told me she is out of it and no refill, verbal order given for one refill, until she follows with PCP team   Will notify PCP

## 2018-06-09 ENCOUNTER — Ambulatory Visit
Admission: RE | Admit: 2018-06-09 | Discharge: 2018-06-09 | Disposition: A | Discharge status: Home / Self Care | Payer: Medicare Other | Attending: Internal Medicine | Admitting: Internal Medicine

## 2018-06-09 DIAGNOSIS — D175 Benign lipomatous neoplasm of intra-abdominal organs: Secondary | ICD-10-CM | POA: Diagnosis not present

## 2018-06-09 DIAGNOSIS — K838 Other specified diseases of biliary tract: Secondary | ICD-10-CM

## 2018-06-09 DIAGNOSIS — K869 Disease of pancreas, unspecified: Secondary | ICD-10-CM | POA: Diagnosis present

## 2018-06-09 DIAGNOSIS — D1779 Benign lipomatous neoplasm of other sites: Secondary | ICD-10-CM | POA: Diagnosis not present

## 2018-06-09 MED ORDER — GADOTERIDOL 279.3 MG/ML IV SOLN
1.00 mL | Freq: Once | INTRAVENOUS | Status: AC
Start: 2018-06-09 — End: 2018-06-09
  Administered 2018-06-09: 20 mL via INTRAVENOUS

## 2018-06-09 MED ORDER — SODIUM CHLORIDE 0.9 % IV BOLUS
20.00 mL | Freq: Once | INTRAVENOUS | Status: AC
Start: 2018-06-09 — End: 2018-06-09
  Administered 2018-06-09: 20 mL via INTRAVENOUS

## 2018-06-09 NOTE — Progress Notes (Signed)
PRIOR AUTHORIZATION FOR carafate susp has been approved for     Start Date: 03/08/18  End Date: 06/06/19    PA #:  Copay:0      The patient and their preferred pharmacy are aware of the approval.  Approval notice has been scanned into Environmental health practitioner.

## 2018-06-10 DIAGNOSIS — D1779 Benign lipomatous neoplasm of other sites: Secondary | ICD-10-CM

## 2018-06-10 DIAGNOSIS — K838 Other specified diseases of biliary tract: Secondary | ICD-10-CM

## 2018-06-11 ENCOUNTER — Telehealth (HOSPITAL_BASED_OUTPATIENT_CLINIC_OR_DEPARTMENT_OTHER): Payer: Self-pay | Admitting: Internal Medicine

## 2018-06-11 ENCOUNTER — Emergency Department
Admission: AD | Admit: 2018-06-11 | Discharge: 2018-06-12 | Disposition: A | Discharge status: Home / Self Care | Payer: Medicare Other | Source: Intra-hospital | Attending: Student in an Organized Health Care Education/Training Program | Admitting: Student in an Organized Health Care Education/Training Program

## 2018-06-11 ENCOUNTER — Encounter (HOSPITAL_BASED_OUTPATIENT_CLINIC_OR_DEPARTMENT_OTHER): Payer: Self-pay

## 2018-06-11 DIAGNOSIS — R079 Chest pain, unspecified: Secondary | ICD-10-CM

## 2018-06-11 DIAGNOSIS — Z881 Allergy status to other antibiotic agents status: Secondary | ICD-10-CM | POA: Diagnosis not present

## 2018-06-11 DIAGNOSIS — Z882 Allergy status to sulfonamides status: Secondary | ICD-10-CM | POA: Diagnosis not present

## 2018-06-11 DIAGNOSIS — Z87891 Personal history of nicotine dependence: Secondary | ICD-10-CM

## 2018-06-11 DIAGNOSIS — Z885 Allergy status to narcotic agent status: Secondary | ICD-10-CM

## 2018-06-11 DIAGNOSIS — I10 Essential (primary) hypertension: Secondary | ICD-10-CM | POA: Diagnosis not present

## 2018-06-11 DIAGNOSIS — E785 Hyperlipidemia, unspecified: Secondary | ICD-10-CM | POA: Diagnosis not present

## 2018-06-11 DIAGNOSIS — J449 Chronic obstructive pulmonary disease, unspecified: Secondary | ICD-10-CM | POA: Diagnosis present

## 2018-06-11 DIAGNOSIS — R110 Nausea: Secondary | ICD-10-CM | POA: Diagnosis not present

## 2018-06-11 DIAGNOSIS — R1084 Generalized abdominal pain: Secondary | ICD-10-CM | POA: Diagnosis not present

## 2018-06-11 DIAGNOSIS — E119 Type 2 diabetes mellitus without complications: Secondary | ICD-10-CM | POA: Diagnosis not present

## 2018-06-11 DIAGNOSIS — K219 Gastro-esophageal reflux disease without esophagitis: Secondary | ICD-10-CM | POA: Diagnosis not present

## 2018-06-11 LAB — COMPREHENSIVE METABOLIC PANEL
ALANINE AMINOTRANSFERASE: 26 U/L (ref 12–45)
ALBUMIN: 3.8 g/dL (ref 3.4–5.0)
ALKALINE PHOSPHATASE: 98 U/L (ref 45–117)
ANION GAP: 8 mmol/L (ref 5–15)
ASPARTATE AMINOTRANSFERASE: 19 U/L (ref 8–34)
BILIRUBIN TOTAL: 0.1 mg/dL — ABNORMAL LOW (ref 0.2–1.0)
BUN (UREA NITROGEN): 17 mg/dL (ref 7–18)
CALCIUM: 9.2 mg/dL (ref 8.5–10.1)
CARBON DIOXIDE: 29 mmol/L (ref 21–32)
CHLORIDE: 104 mmol/L (ref 98–107)
CREATININE: 0.7 mg/dL (ref 0.4–1.2)
ESTIMATED GLOMERULAR FILT RATE: >60 mL/min (ref >60)
Glucose Random: 104 mg/dL (ref 74–160)
POTASSIUM: 4.2 mmol/L (ref 3.5–5.1)
SODIUM: 140 mmol/L (ref 136–145)
TOTAL PROTEIN: 7.4 g/dL (ref 6.4–8.2)

## 2018-06-11 LAB — MAGNESIUM: MAGNESIUM: 1.9 mg/dL (ref 1.8–2.4)

## 2018-06-11 LAB — CBC, PLATELET & DIFFERENTIAL
ABSOLUTE BASO COUNT: 0 10*3/uL (ref 0.0–0.1)
ABSOLUTE EOSINOPHIL COUNT: 0.5 10*3/uL (ref 0.0–0.8)
ABSOLUTE IMM GRAN COUNT: 0.06 10*3/uL — ABNORMAL HIGH (ref 0.00–0.03)
ABSOLUTE LYMPH COUNT: 2.9 10*3/uL (ref 0.6–5.9)
ABSOLUTE MONO COUNT: 1 10*3/uL (ref 0.2–1.4)
ABSOLUTE NEUTROPHIL COUNT: 6.6 10*3/uL (ref 1.6–8.3)
BASOPHIL %: 0.3 % (ref 0.0–1.2)
EOSINOPHIL %: 4.8 % (ref 0.0–7.0)
HEMATOCRIT: 40.8 % (ref 34.1–44.9)
HEMOGLOBIN: 13.1 g/dL (ref 11.2–15.7)
IMMATURE GRANULOCYTE %: 0.5 % — ABNORMAL HIGH (ref 0.0–0.4)
LYMPHOCYTE %: 26.2 % (ref 15.0–54.0)
MEAN CORP HGB CONC: 32.1 g/dL (ref 31.0–37.0)
MEAN CORPUSCULAR HGB: 29.1 pg (ref 26.0–34.0)
MEAN CORPUSCULAR VOL: 90.7 fL (ref 80.0–100.0)
MEAN PLATELET VOLUME: 11.1 fL (ref 8.7–12.5)
MONOCYTE %: 8.9 % (ref 4.0–13.0)
NEUTROPHIL %: 59.3 % (ref 40.0–75.0)
PLATELET COUNT: 319 10*3/uL (ref 150–400)
RBC DISTRIBUTION WIDTH STD DEV: 45.2 fL (ref 35.1–46.3)
RBC DISTRIBUTION WIDTH: 13.9 % (ref 11.5–14.3)
RED BLOOD CELL COUNT: 4.5 M/uL (ref 3.90–5.20)
WHITE BLOOD CELL COUNT: 11.2 10*3/uL — ABNORMAL HIGH (ref 4.0–11.0)

## 2018-06-11 LAB — BLOOD SUGAR FINGERSTICK (POINT OF CARE): FINGERSTICK GLUCOSE: 98 mg/dl (ref 74–160)

## 2018-06-11 LAB — LIPASE: LIPASE: 205 U/L (ref 73–393)

## 2018-06-11 MED ORDER — ACETAMINOPHEN 500 MG PO TABS
1000.00 mg | ORAL_TABLET | Freq: Once | ORAL | Status: AC
Start: 2018-06-11 — End: 2018-06-11
  Administered 2018-06-11: 1000 mg via ORAL
  Filled 2018-06-11: qty 2

## 2018-06-11 MED ORDER — METOCLOPRAMIDE HCL 5 MG/ML IJ SOLN
10.00 mg | Freq: Once | INTRAMUSCULAR | Status: AC
Start: 2018-06-11 — End: 2018-06-11
  Administered 2018-06-11: 10 mg via INTRAVENOUS
  Filled 2018-06-11: qty 2

## 2018-06-11 MED ORDER — SODIUM CHLORIDE 0.9 % IV BOLUS
1000.0000 mL | Freq: Once | INTRAVENOUS | Status: AC
  Administered 2018-06-11: 1000 mL via INTRAVENOUS

## 2018-06-11 MED ORDER — ALUMINUM-MAGNESIUM-SIMETHICONE 200-200-20 MG/5ML PO SUSP
30.00 mL | Freq: Once | ORAL | Status: AC
Start: 2018-06-11 — End: 2018-06-11
  Administered 2018-06-11: 30 mL via ORAL
  Filled 2018-06-11: qty 30

## 2018-06-11 NOTE — ED Triage Note (Signed)
Pt to ED with c/o of abdominal pain and burning sensation since 5om this evening. Pt states she's been having ongoing GI issues since may and nothing has helped. Pt states nausea with no vomiting. She has a scheduled appointment with her GI doctor August 2nd but came to ED d/t her pain.

## 2018-06-11 NOTE — Narrator Note (Signed)
Pt states that she is having burning abdominal pain that radiates up into her chest.  Pt states that her PCP has her on many gastric type medications with no relief.  Pt states that she has an appointment with GI doctor on August 2nd, but can't wait that long due to increases pain.  Pt states that she had a CT done that showed 2 lesions on her pancreas and had an MRI done on Monday, but hasn't received her results yet.  Pt also c/o feeling nausea with no vomiting.

## 2018-06-11 NOTE — Discharge Instructions (Addendum)
You are seen in the emergency department for generalized abdominal pain, with mild nausea.  These continue the Zofran as needed for nausea.  Please keep your appointment for endoscopy as scheduled.  Please call your primary care doctor tomorrow for a follow-up.  Please return to the emergency department if you have worsening abdominal pain that localizes to a particular spot in your abdomen, high fever, chills, nausea and vomiting that keeps you from keeping down food or fluids or other new or concerning symptoms.

## 2018-06-12 ENCOUNTER — Encounter (HOSPITAL_BASED_OUTPATIENT_CLINIC_OR_DEPARTMENT_OTHER): Payer: Self-pay | Admitting: Internal Medicine

## 2018-06-12 NOTE — ED Provider Notes (Signed)
The initial history and physical exam information was obtained by the PA-C, who also made a detailed record of this visit. I performed a history and physical examination of the patient and discussed management with the PA-C. I reviewed the PA-C's note and agree with the documented findings and plan of care, except as noted. I also reviewed the patient's past medical history/problem list, past surgical history, medication list, social history and allergies and disposition decisions.  Emergency per minute nursing record was reviewed.  Pertinent labs and imaging reviewed.  Prior records as available electronically through the epic record were reviewed.    ED Course as of Jun 12 445   Wed Jun 11, 2018   2327 20 F w/ generalized abd pain --> epigastrum x months. Sign outpatient workup. On lidocaine, bentyl, PPI, h2 blocker. Nausea. No vomiting. Likely recurrent GERD. Plan for labs, IVF, symptomatic treatment.     [FS]     Pain improved after medications  Patient encouraged to follow-up with GI as scheduled    Graciella Freer, MD  Attending Physician  [facility]

## 2018-06-12 NOTE — Narrator Note (Signed)
Patient Disposition    Patient education for diagnosis, medications, activity, diet and follow-up.  Patient left ED 12:23 AM.  Patient rep received written instructions.  Interpreter to provide instructions: No    Patient belongings with patient: YES    Have all existing LDAs been addressed? Yes    Have all IV infusions been stopped? N/A    Discharged to: Discharged to home    Pt discharged. RN reviewed discharge instructions with pt. All questions/concerns addressed.

## 2018-06-12 NOTE — ED Provider Notes (Signed)
ED nursing record was reviewed. Prior records as available electronically through the Epic record were reviewed.    Patient was seen along with Dr. Laurin Coder and management was discussed with them.    HPI:    This 62 year old female patient presents to the Emergency Department with chief complaint of generalized cramping abdominal pain, generally localizing in the epigastrium for several months.  Patient has had outpatient work-up already, and is currently prescribed lidocaine, Bentyl, PPI, and H2 blocker, and has been using them, she use the lidocaine and Maalox twice today, with no improvement of symptoms, thus presents the emergency department.  She states the pain is the same as her usual pain.  She states she has some nausea, but no vomiting.  No diarrhea, in fact she states that she has not had a bowel movement in nearly 3 days, she is passing flatus.  She has history of cholecystectomy but no other abdominal surgical history.  She denies any fevers chills or undercooked foods.  No sick contacts.      ROS: Pertinent positives were reviewed as per the HPI above. All other systems were reviewed and are negative.      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  No date: Esophageal reflux  No date: External hemorrhoid      Comment:  per steward records  No date: HTN (hypertension)  02/01/2012: Hypoxia  02/16/2017: Left ear pain  06/07/2015: Left genital labial abscess  06/16/2015: Obesity, Class III, 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y) ([place])  08/16/2015: Osteopenia      Comment:  On xray of left knee per steward records 06/2014 Unclear                if addressed  09/14/2015: Right ear impacted cerumen  08/15/2015: Type 2 diabetes mellitus without complication, without   long-term current use of insulin (HCC)      Comment:   HEMOGLOBIN A1C (%) Date Value 07/15/2015 6.2 (H)                ----------  Steward record - a1c 6.2 on 01/31/15 6.4 on                09/30/14 5.9 on 12/29/13 6.4 08/18/13   11/18/2017: Viral URI with cough  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) ([place])  Tobacco use disorder     Spinal stenosis of lumbar region     HLD (hyperlipidemia)     Chronic bilateral low back pain with bilateral sciatica     Skin change     Pineal gland cyst     History of vitamin D deficiency     Hemorrhoids     Venous (peripheral) insufficiency     Adrenal adenoma     Type 2 diabetes mellitus with microalbuminuria, without long-term current use of insulin (HCC)     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 (HCC)     Acute pain of right wrist     Abnormal PFTs     Screening for malignant neoplasm of breast     Skin irritation     H/O bone density study     Vertigo     Orthostatic hypotension     Social problem     Right arm weakness     Insomnia     Acute pain of left knee     Hx of total knee replacement, left     Acute bacterial sinusitis     Seasonal allergic rhinitis     Left foot pain     Stress due to family tension     Gastritis without bleeding     Leucocytosis     Gastroesophageal reflux disease without esophagitis      HOSP-TO-HOME COMMUNITY COLLABORATION  PROGRAM     Epigastric pain     Hx of gastritis     Common bile duct dilation     Fatty liver     Pancreatic lesion     Vascular calcification     Umbilical hernia without obstruction and without gangrene     Arthritis of spine     Scoliosis of thoracic spine     Anxiety     Sleep disturbance        Past Surgical History: Past Surgical History:  No date: ANES NERVE MUSC TENDON FASCIA&BURSA KNEE&/POPLT  No date: FOOT SURGERY      Comment:  bilateral  No date: LAPAROSCOPY SURG CHOLECYSTECTOMY  No date: OB ANTEPARTUM CARE CESAREAN DLVR & POSTPARTUM      Comment:  x3  No date: TONSILLECTOMY & ADENOIDECTOMY <AGE 50  04/13/2009: TOTAL KNEE REPLACEMENT      Comment:  left  08/11/2009: WRIST GANGLION EXCISION      Comment:  left       Medications:   No current facility-administered medications on file prior to encounter.   Current Outpatient Medications on File Prior to Encounter:  lidocaine (XYLOCAINE) 2 % solution Take 10 mLs by mouth as needed for Pain Use as directed for abd pain Disp: 100 mL Rfl: 2   metoclopramide (REGLAN) 10 MG tablet Take 1 tablet by mouth every 6 (six) hours as needed (nausea) Disp: 60 tablet Rfl: 1   aluminum-magnesium hydroxide (MAALOX) 200-200 mg/5 mL suspension Take 30 mLs by mouth every 6 (six) hours as needed for Indigestion Disp: 300 mL Rfl: 1   ranitidine (ZANTAC) 75 MG tablet Take 1 tablet PO at bedtime Disp: 30 tablet Rfl: 2   omeprazole (PRILOSEC) 40 MG capsule Take 1 capsule by mouth twice daily. Take 30 minutes before meals Disp: 30 capsule Rfl: 1   sucralfate (CARAFATE) 1 GM/10ML suspension Take 10 mLs by mouth 4 (four) times daily Disp: 1200 mL Rfl: 1   fluticasone (FLONASE) 50 MCG/ACT nasal spray 1 spray by Each Nostril route daily  Disp: 1 Bottle Rfl: 11   albuterol (PROVENTIL) (2.5 MG/3ML) 0.083% nebulizer solution Take 3 mLs by nebulization every 6 (six) hours as needed for Wheezing Disp: 60 mL Rfl: 1   metoprolol (TOPROL-XL) 25 MG 24 hr tablet Take 1 tablet by mouth daily Disp:  90 tablet Rfl: 1   lisinopril (PRINIVIL,ZESTRIL) 20 MG tablet take 1 tablet by mouth once daily Disp: 90 tablet Rfl: 3   ONETOUCH DELICA LANCETS FINE MISC TEST once daily Disp: 100 each Rfl: 11   metFORMIN (GLUCOPHAGE-XR) 500 MG 24 hr tablet take 4 tablets by mouth once daily WITH BREAKFAST Disp: 360 tablet Rfl: 3   atorvastatin (LIPITOR) 20 MG tablet take 1 tablet by mouth once daily Disp: 90 tablet Rfl: 3   ONETOUCH VERIO test strip TEST once daily Disp: 100 strip Rfl: 11   albuterol (PROVENTIL HFA,VENTOLIN HFA, PROAIR HFA) 108 (90 Base) MCG/ACT inhaler Inhale 2 puffs into the lungs every 6 (six) hours as needed for Wheezing or Shortness of breath Disp: 1 Inhaler Rfl: 11   Omega-3 Fatty Acids (FISH OIL) 1000 MG CAPS Take by mouth Disp:  Rfl:    cholecalciferol (VITAMIN D3) 1000 UNIT tablet Take 1 tablet by mouth daily Disp:  Rfl:    pregabalin (LYRICA) 100 MG capsule Take 100 mg by mouth 2 (two) times daily With Dr Dwyane Dee Disp:  Rfl:    oxycodone-acetaminophen (PERCOCET) 5-325 MG per tablet Take 1 tablet by mouth 2 (two) times daily as needed With Dr Dwyane Dee Disp:  Rfl:    aspirin 81 MG tablet Take 81 mg by mouth daily. Disp:  Rfl:          Social History:   Social History     Socioeconomic History    Marital status: Divorced     Spouse name: Not on file    Number of children: Not on file    Years of education: Not on file    Highest education level: Not on file   Occupational History    Not on file   Social Needs    Financial resource strain: Not on file    Food insecurity:     Worry: Not on file     Inability: Not on file    Transportation needs:     Medical: Not on file     Non-medical: Not on file   Tobacco Use    Smoking status: Former Smoker     Packs/day: 0.50     Years: 31.00     Pack years: 15.50     Types: Cigarettes     Last attempt to quit: 03/10/2018     Years since quitting: 0.2    Smokeless tobacco: Never Used    Tobacco comment: quit smoking inform provided to patient   Substance and Sexual  Activity    Alcohol use: No    Drug use: No    Sexual activity: Not Currently     Partners: Male     Birth control/protection: Tubal Ligation   Lifestyle    Physical activity:     Days per week: Not on file     Minutes per session: Not on file    Stress: Not on file   Relationships    Social connections:     Talks on phone: Not on file     Gets together: Not on file     Attends religious service: Not on file     Active member of club or organization: Not  on file     Attends meetings of clubs or organizations: Not on file     Relationship status: Not on file    Intimate partner violence:     Fear of current or ex partner: Not on file     Emotionally abused: Not on file     Physically abused: Not on file     Forced sexual activity: Not on file   Other Topics Concern    Not on file   Social History Narrative    Lives with son    1 daughter and another son passed away    Disabled from back    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 Simons,MD, 07/15/2015, 2:56 PM                Allergies:  Review of Patient's Allergies indicates:   Codeine camsylate       Shortness of Breath, Other (See                            Comments)    Comment:Chest pain   Motrin [ibuprofen]      Nausea Only   Sulfa antibiotics       Other (See Comments)   Bactrim                 Rash, Itching      Physical Exam:  BP 118/53  Pulse 90  Temp 98.2 F  Resp 16  Wt 102.5 kg (226 lb)  LMP 10/22/2007 (LMP Unknown)  SpO2 95%  BMI 41.34 kg/m2    GENERAL: Well appearing, No acute distress, non-toxic.   SKIN:  Warm & Dry, no erythema or rash.  HEAD:  NCAT. Sclerae are anicteric and aninjected, oropharynx is clear with moist mucous membranes. PERRL. EOMI. B TMs clear.  NECK:  No C-spine tenderness, crepitus, step-off.  No meningismus.  No  LAN. No stridor.  LUNGS:  Clear to auscultation bilaterally. No wheezes, rales, rhonchi.   HEART:  RRR.  No murmurs, rubs, or gallops.   ABDOMEN:  Soft, NTND.  No hepatosplenomegaly.  No masses.  No involuntary guarding or rebound.   EXTREMITIES:  No obvious deformities.  CSM intact x 4.     GENITOURINARY:  No CVA tenderness.   NEUROLOGIC:  Alert and oriented x4; moves all extremities well; speaking in clear fluent sentences. CNsII-XII symmetrical and intact. Sensation intact to light touch throughout. 5/5 strength globally. Normoreflexic bilaterally  PSYCHIATRIC:  Appropriate for age, time of day, and situation           ED Course and Medical Decision-making:  This 62 year old female patient who presents the emergency department with generalized cramping abdominal pain.  Patient states the pain is the same as her previous pain, she has no abdominal tenderness rebound or guarding to my exam.  She is well-appearing, with normal stable vital signs.  Patient was able to tolerate fluids here in the emergency department, and had some improvement with IV fluids and Reglan.  Lab work is reassuring.  Patient does have a slight elevation white blood cell count likely from demargination from vomiting.  Fingerstick is 98 9 do not suspect DKA lipase is within normal limits.  Patient was reassured, and advised to follow-up with PCP, and to call for earlier appointment with gastroenterology if possible, she understands is in agreement with her care plan at this time and was discharged home.          Condition: Improved stable   Disposition: Home       Diagnosis/Diagnoses:  Generalized abdominal pain    Ofilia Neas, PA-C    This Emergency Department patient encounter note was created using voice-recognition software and in real time during the ED visit. Please excuse any typographical errors that have not been edited out.

## 2018-06-12 NOTE — Progress Notes (Signed)
Late entry  Paged 7/24 ~840pm  Patient with burning abd pain despite home sx control meds  Worried about this  More severe than recent  Discussed ED presentation for eval if maximal home medication is not controlling sx  Patient agrees

## 2018-06-14 ENCOUNTER — Other Ambulatory Visit (HOSPITAL_BASED_OUTPATIENT_CLINIC_OR_DEPARTMENT_OTHER): Payer: Self-pay | Admitting: Physician Assistant

## 2018-06-14 ENCOUNTER — Telehealth (HOSPITAL_BASED_OUTPATIENT_CLINIC_OR_DEPARTMENT_OTHER): Payer: Self-pay | Admitting: Family Medicine

## 2018-06-14 DIAGNOSIS — K219 Gastro-esophageal reflux disease without esophagitis: Secondary | ICD-10-CM

## 2018-06-14 DIAGNOSIS — K2970 Gastritis, unspecified, without bleeding: Secondary | ICD-10-CM

## 2018-06-14 LAB — EKG

## 2018-06-14 MED ORDER — ALUMINUM & MAGNESIUM HYDROXIDE 200-200 MG/5ML PO SUSP
30.0000 mL | Freq: Four times a day (QID) | ORAL | 1 refills | Status: DC | PRN
Start: 2018-06-14 — End: 2018-06-30

## 2018-06-14 MED ORDER — ALUMINUM & MAGNESIUM HYDROXIDE 200-200 MG/5ML PO SUSP: 30 mL | mL | Freq: Four times a day (QID) | ORAL | 1 refills | 0 days | Status: DC | PRN

## 2018-06-14 NOTE — Progress Notes (Signed)
PER Pharmacy, Samantha Cameron is a 62 year old female has requested a refill of omeprazole.      Last Office Visit: 05/19/18 with Trinda Pascal  Last Physical Exam: 08/15/15    COLONOSCOPY due on 09/01/2018  DM EYE EXAM due on 09/12/2018    Other Med Adult:  Most Recent BP Reading(s)  06/12/18 : 118/53        Cholesterol (mg/dL)   Date Value   10/28/2017 131     LOW DENSITY LIPOPROTEIN DIRECT (mg/dL)   Date Value   10/28/2017 61     HIGH DENSITY LIPOPROTEIN (mg/dL)   Date Value   10/28/2017 47     TRIGLYCERIDES (mg/dL)   Date Value   10/28/2017 240 (H)         THYROID SCREEN TSH REFLEX FT4 (uIU/mL)   Date Value   05/14/2018 1.990         No results found for: TSH    HEMOGLOBIN A1C (%)   Date Value   05/14/2018 6.3 (H)       No results found for: POCA1C      INR (no units)   Date Value   04/18/2017 1.0   05/27/2016 1.0   02/01/2012 < 1.0 (L)       SODIUM (mmol/L)   Date Value   06/11/2018 140       POTASSIUM (mmol/L)   Date Value   06/11/2018 4.2           CREATININE (mg/dL)   Date Value   06/11/2018 0.7       Documented patient preferred pharmacies:    [facility] - [address], [place]  Phone: [phone] Fax: [phone]

## 2018-06-14 NOTE — Progress Notes (Signed)
After hours call:    Patient calling because needs refill of maalox. Out of it and having regular stomach pain. No new symptoms.    Refill sent to pharmacy.

## 2018-06-17 ENCOUNTER — Telehealth (HOSPITAL_BASED_OUTPATIENT_CLINIC_OR_DEPARTMENT_OTHER): Payer: Self-pay

## 2018-06-17 NOTE — Telephone Encounter (Signed)
-----   Message from Joella Prince sent at 06/17/2018  1:16 PM EDT -----  Regarding: [place] [id number], 62 year old, female, Telephone Information:  Home Phone      [phone]  Work Phone      Not on file.  Mobile          [phone]      Patient's Preferred Pharmacy:     [facility] - [address], [place]  Phone: [phone] Fax: [phone]      CONFIRMED TODAY: Yes    CALL BACK NUMBER:    [phone]  Best time to call back: /  Cell phone:   Other phone:    Available times:    Patient's language of care: English    Patient does not need an interpreter.    Patient's PCP: Driscilla Grammes, MD    Person calling on behalf of patient: Patient (self)    Calls today with a sick call. Patient is calling stating she cant wait to schedule `her tests, she needs them now, shes in too much pain'

## 2018-06-17 NOTE — Progress Notes (Signed)
Called and spoke with patient she was upset about upper endoscopy appt being cancelled for Friday, since I received message she reports she got a call and has been re-scheduled for MOnday.

## 2018-06-17 NOTE — Telephone Encounter (Signed)
-----   Message from [facility] sent at 06/17/2018 12:54 PM EDT -----  Regarding: Looking for Sooner GI Appt   Patient walked in looking to speak to Dr [name] . Patient had a procedure appt sched 06/20/18. Patient is upset . Appt was resch for sept . She states she is very sick and would like Dr [name] to call to see if they can move the appt . Please return call to patient . Thank you .

## 2018-06-17 NOTE — Progress Notes (Signed)
Spoke to the patient  She is having burning epigastric pain  Aware of new appt date/time, 08/05 at 12:30p  Encouraged pt to present to ED with new/worsening sx before that time  She voiced understanding

## 2018-06-18 ENCOUNTER — Encounter (HOSPITAL_BASED_OUTPATIENT_CLINIC_OR_DEPARTMENT_OTHER): Payer: Self-pay

## 2018-06-18 ENCOUNTER — Emergency Department
Admission: AD | Admit: 2018-06-18 | Discharge: 2018-06-18 | Disposition: A | Discharge status: Home / Self Care | Payer: Medicare Other | Source: Intra-hospital | Attending: Emergency Medicine | Admitting: Emergency Medicine

## 2018-06-18 DIAGNOSIS — Z882 Allergy status to sulfonamides status: Secondary | ICD-10-CM

## 2018-06-18 DIAGNOSIS — Z886 Allergy status to analgesic agent status: Secondary | ICD-10-CM

## 2018-06-18 DIAGNOSIS — Z87891 Personal history of nicotine dependence: Secondary | ICD-10-CM | POA: Diagnosis not present

## 2018-06-18 DIAGNOSIS — R110 Nausea: Secondary | ICD-10-CM | POA: Diagnosis present

## 2018-06-18 DIAGNOSIS — Z885 Allergy status to narcotic agent status: Secondary | ICD-10-CM | POA: Diagnosis not present

## 2018-06-18 DIAGNOSIS — R1084 Generalized abdominal pain: Secondary | ICD-10-CM | POA: Diagnosis not present

## 2018-06-18 DIAGNOSIS — E119 Type 2 diabetes mellitus without complications: Secondary | ICD-10-CM | POA: Diagnosis present

## 2018-06-18 DIAGNOSIS — Z881 Allergy status to other antibiotic agents status: Secondary | ICD-10-CM | POA: Diagnosis not present

## 2018-06-18 DIAGNOSIS — J449 Chronic obstructive pulmonary disease, unspecified: Secondary | ICD-10-CM | POA: Diagnosis not present

## 2018-06-18 DIAGNOSIS — I10 Essential (primary) hypertension: Secondary | ICD-10-CM

## 2018-06-18 LAB — CBC, PLATELET & DIFFERENTIAL
ABSOLUTE BASO COUNT: 0 10*3/uL (ref 0.0–0.1)
ABSOLUTE EOSINOPHIL COUNT: 0.3 10*3/uL (ref 0.0–0.8)
ABSOLUTE IMM GRAN COUNT: 0.01 10*3/uL (ref 0.00–0.03)
ABSOLUTE LYMPH COUNT: 2.6 10*3/uL (ref 0.6–5.9)
ABSOLUTE MONO COUNT: 0.9 10*3/uL (ref 0.2–1.4)
ABSOLUTE NEUTROPHIL COUNT: 6.2 10*3/uL (ref 1.6–8.3)
BASOPHIL %: 0.2 % (ref 0.0–1.2)
EOSINOPHIL %: 3.1 % (ref 0.0–7.0)
HEMATOCRIT: 40.9 % (ref 34.1–44.9)
HEMOGLOBIN: 13.1 g/dL (ref 11.2–15.7)
IMMATURE GRANULOCYTE %: 0.1 % (ref 0.0–0.4)
LYMPHOCYTE %: 26.1 % (ref 15.0–54.0)
MEAN CORP HGB CONC: 32 g/dL (ref 31.0–37.0)
MEAN CORPUSCULAR HGB: 29.2 pg (ref 26.0–34.0)
MEAN CORPUSCULAR VOL: 91.3 fL (ref 80.0–100.0)
MEAN PLATELET VOLUME: 11.2 fL (ref 8.7–12.5)
MONOCYTE %: 8.5 % (ref 4.0–13.0)
NEUTROPHIL %: 62 % (ref 40.0–75.0)
PLATELET COUNT: 297 10*3/uL (ref 150–400)
RBC DISTRIBUTION WIDTH STD DEV: 45.1 fL (ref 35.1–46.3)
RBC DISTRIBUTION WIDTH: 13.7 % (ref 11.5–14.3)
RED BLOOD CELL COUNT: 4.48 M/uL (ref 3.90–5.20)
WHITE BLOOD CELL COUNT: 10 10*3/uL (ref 4.0–11.0)

## 2018-06-18 LAB — HEPATIC FUNCTION PANEL
ALANINE AMINOTRANSFERASE: 25 U/L (ref 12–45)
ALBUMIN: 3.8 g/dL (ref 3.4–5.0)
ALKALINE PHOSPHATASE: 97 U/L (ref 45–117)
ASPARTATE AMINOTRANSFERASE: 17 U/L (ref 8–34)
BILIRUBIN DIRECT: 0.1 mg/dl (ref 0.0–0.2)
BILIRUBIN TOTAL: 0.1 mg/dL — ABNORMAL LOW (ref 0.2–1.0)
INDIRECT BILIRUBIN: 0 mg/dL — ABNORMAL LOW (ref 0.2–0.9)
TOTAL PROTEIN: 7.3 g/dL (ref 6.4–8.2)

## 2018-06-18 LAB — BASIC METABOLIC PANEL
ANION GAP: 12 mmol/L (ref 5–15)
BUN (UREA NITROGEN): 15 mg/dL (ref 7–18)
CALCIUM: 9.4 mg/dL (ref 8.5–10.1)
CARBON DIOXIDE: 26 mmol/L (ref 21–32)
CHLORIDE: 103 mmol/L (ref 98–107)
CREATININE: 0.7 mg/dL (ref 0.4–1.2)
ESTIMATED GLOMERULAR FILT RATE: >60 mL/min (ref >60)
Glucose Random: 97 mg/dL (ref 74–160)
POTASSIUM: 4.5 mmol/L (ref 3.5–5.1)
SODIUM: 141 mmol/L (ref 136–145)

## 2018-06-18 LAB — MAGNESIUM: MAGNESIUM: 1.8 mg/dL (ref 1.8–2.4)

## 2018-06-18 LAB — LIPASE: LIPASE: 223 U/L (ref 73–393)

## 2018-06-18 MED ORDER — SUCRALFATE 1 G PO TABS
1.00 g | ORAL_TABLET | Freq: Once | ORAL | Status: AC
Start: 2018-06-18 — End: 2018-06-18
  Administered 2018-06-18: 1 g via ORAL
  Filled 2018-06-18: qty 1

## 2018-06-18 MED ORDER — LIDOCAINE VISCOUS 2 % MT SOLN
10.0000 mL | Freq: Once | OROMUCOSAL | Status: AC
Start: 2018-06-18 — End: 2018-06-18
  Administered 2018-06-18: 10 mL via OROMUCOSAL
  Filled 2018-06-18: qty 15

## 2018-06-18 MED ORDER — METOCLOPRAMIDE HCL 5 MG/ML IJ SOLN
10.00 mg | Freq: Once | INTRAMUSCULAR | Status: AC
Start: 2018-06-18 — End: 2018-06-18
  Administered 2018-06-18: 10 mg via INTRAVENOUS
  Filled 2018-06-18: qty 2

## 2018-06-18 MED ORDER — SODIUM CHLORIDE 0.9 % IV BOLUS
1000.0000 mL | Freq: Once | INTRAVENOUS | Status: AC
Start: 2018-06-18 — End: 2018-06-18
  Administered 2018-06-18: 1000 mL via INTRAVENOUS

## 2018-06-18 MED ORDER — FAMOTIDINE 20 MG/2ML IV SOLN
20.0000 mg | Freq: Once | INTRAVENOUS | Status: AC
Start: 2018-06-18 — End: 2018-06-18
  Administered 2018-06-18: 20 mg via INTRAVENOUS
  Filled 2018-06-18: qty 2

## 2018-06-18 MED ORDER — ALUMINUM & MAGNESIUM HYDROXIDE 200-200 MG/5ML PO SUSP
30.0000 mL | Freq: Once | ORAL | Status: AC
Start: 2018-06-18 — End: 2018-06-18
  Administered 2018-06-18: 30 mL via ORAL
  Filled 2018-06-18: qty 30

## 2018-06-18 NOTE — ED Provider Notes (Signed)
Patient seen and evaluated with the PA/resident. Please see their ED Provider Note for additional details.    Subjective:   Hx GERD, here with epigastric burning and diffuse abdominal pain. Took maalox without relief.     Objective:  Abdomen soft and without tenderness. Lungs clear.     Assessment/Plan:  Labs reassuring, feeling better, appears stable for discharge with outpatient follow up.    Olam Idler, MD  Attending Physician  [facility] Department of Emergency Medicine

## 2018-06-18 NOTE — ED Provider Notes (Signed)
ED nursing record was reviewed. Prior medical records as available through Epic were reviewed.    This patient was seen with Emergency Department attending physician Dr. Junious Silk    HPI:    Samantha Cameron is a 62 year old female with past medical history significant for pancreatic lesions, gastritis, GERD, type 2 diabetes, COPD who presents at 2009.    Patient presents for evaluation of abdominal pain, nausea.  Multiple visits to the emergency department for the same recently.  Reports this feels similar to prior, described as burning throughout the abdomen. She has an appointment scheduled next week with GI for an endoscopy.  She has tried Maalox, lidocaine, Mylanta without relief of her symptoms.  Denies fevers, chest pain, shortness of breath, vomiting, bowel or bladder complaints, vaginal bleeding or discharge.  Last bowel movement this morning, reported normal.    ROS: Pertinent positives were reviewed as per the HPI above. All other systems were reviewed and are negative.  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) ([place])  09/14/2015: Cough  No date: Depression       Comment:  per steward records  No date: Diabetes  No date: Disorders of lipoid metabolism  No date: Esophageal reflux  No date: External hemorrhoid      Comment:  per steward records  No date: HTN (hypertension)  02/01/2012: Hypoxia  02/16/2017: Left ear pain  06/07/2015: Left genital labial abscess  06/16/2015: Obesity, Class III, 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y) ([place])  08/16/2015: Osteopenia      Comment:  On xray of left knee per steward records 06/2014 Unclear                if addressed  05/23/2018: Pancreatic lesion  09/14/2015: Right ear impacted cerumen  08/15/2015: Type 2 diabetes mellitus without complication, without   long-term current use of insulin (HCC)      Comment:   HEMOGLOBIN A1C (%) Date Value 07/15/2015 6.2 (H)                ----------  Steward record - a1c 6.2 on 01/31/15 6.4 on                09/30/14 5.9 on 12/29/13 6.4 08/18/13   11/18/2017: Viral URI with cough  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) ([place])  Tobacco use disorder     Spinal stenosis of lumbar region     HLD (hyperlipidemia)     Chronic bilateral low back pain with bilateral sciatica     Skin change     Pineal gland cyst     History of vitamin D deficiency     Hemorrhoids     Venous (peripheral) insufficiency     Adrenal adenoma     Type 2 diabetes mellitus with microalbuminuria, without long-term current use of insulin (HCC)     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 (HCC)     Acute pain of right wrist     Abnormal PFTs     Screening for malignant neoplasm of breast     Skin irritation     H/O bone density study     Vertigo     Orthostatic hypotension     Social problem     Right arm weakness     Insomnia     Acute pain of left knee     Hx of total knee replacement, left     Acute bacterial sinusitis     Seasonal allergic rhinitis     Left foot pain     Stress due to family tension     Gastritis without bleeding     Leucocytosis     Gastroesophageal reflux disease  without esophagitis      HOSP-TO-HOME COMMUNITY COLLABORATION PROGRAM     Epigastric pain     Hx of gastritis     Common bile duct dilation     Fatty liver     Pancreatic lesion     Vascular calcification     Umbilical hernia without obstruction and without gangrene     Arthritis of spine     Scoliosis of thoracic spine     Anxiety     Sleep disturbance    Past Surgical History: Past Surgical History:  No date: ANES NERVE MUSC TENDON FASCIA&BURSA KNEE&/POPLT  No date: FOOT SURGERY      Comment:  bilateral  No date: LAPAROSCOPY SURG CHOLECYSTECTOMY  No date: OB ANTEPARTUM CARE CESAREAN DLVR & POSTPARTUM      Comment:  x3  No date: TONSILLECTOMY & ADENOIDECTOMY <AGE 9  04/13/2009: TOTAL KNEE REPLACEMENT      Comment:  left  08/11/2009: WRIST GANGLION EXCISION      Comment:  left   Social History:   Social History     Socioeconomic History    Marital status: Divorced     Spouse name: Not on file    Number of children: Not on file    Years of education: Not on file    Highest education level: Not on file   Occupational History    Not on file   Social Needs    Financial resource strain: Not on file    Food insecurity:     Worry: Not on file     Inability: Not on file    Transportation needs:     Medical: Not on file     Non-medical: Not on file   Tobacco Use    Smoking status: Former Smoker     Packs/day: 0.50     Years: 31.00     Pack years: 15.50     Types: Cigarettes     Last attempt to quit: 03/10/2018     Years since quitting: 0.2    Smokeless tobacco: Never Used    Tobacco comment: quit  smoking inform provided to patient   Substance and Sexual Activity    Alcohol use: No    Drug use: No    Sexual activity: Not Currently     Partners: Male     Birth control/protection: Tubal Ligation   Lifestyle    Physical activity:     Days per week: Not on file     Minutes per session: Not on file    Stress: Not on file   Relationships    Social connections:     Talks on phone: Not on file     Gets together: Not  on file     Attends religious service: Not on file     Active member of club or organization: Not on file     Attends meetings of clubs or organizations: Not on file     Relationship status: Not on file    Intimate partner violence:     Fear of current or ex partner: Not on file     Emotionally abused: Not on file     Physically abused: Not on file     Forced sexual activity: Not on file   Other Topics Concern    Not on file   Social History Narrative    Lives with son    1 daughter and another son passed away    Disabled from back    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 Simons,MD, 07/15/2015, 2:56 PM           Allergies: Review of Patient's Allergies indicates:   Codeine camsylate       Shortness of Breath, Other (See                            Comments)    Comment:Chest pain   Motrin [ibuprofen]      Nausea Only   Sulfa antibiotics       Other (See Comments)   Bactrim                 Rash, Itching  Immunizations:   Immunization History   Administered Date(s) Administered    HEP B ADULT 3 DOSE 20 and > 08/15/2015, 09/14/2015, 05/17/2016    INFLUENZA VIRUS TRI W/PRESV VACCINE 18/> YRS IM (PRIVATE) 10/14/2007, 09/17/2008, 09/01/2009, 08/07/2010, 08/14/2011, 09/03/2011, 09/12/2012, 08/18/2013, 08/24/2014, 06/30/2015    Influenza Virus Quad Presv Free Vacc 3/> Yrs IM 06/30/2015, 12/11/2016, 07/02/2017    Influenza Virus Quadrivalent Vacc 3/> Yrs Im 06/30/2015, 07/02/2017    Influenza Virus Tri Presv Free 3/> YRS IM 07/03/2016    PNEUMOCOCCAL POLYSACCHARIDE VACCINE v23 03/12/2014, 08/15/2015, 12/01/2015    Pneumococcal Vaccine, Conjugate V7 01/31/2009    Td 11/01/2001    Tdap 09/17/2008, 12/11/2016    ZOSTER SHINGLES VACC, LIVE SC 08/24/2008, 12/01/2015    Zoster Vacc (HZV) Recomb Adjv, IM, 2  Dose, ([place]) 05/30/2017, 07/17/2017          Medications:  Prior to Admission Medications   Prescriptions Last Dose Informant Patient Reported? Taking?   ONETOUCH DELICA LANCETS FINE MISC   No No   Sig: TEST once daily   ONETOUCH VERIO  test strip   No No   Sig: TEST once daily   Omega-3 Fatty Acids (FISH OIL) 1000 MG CAPS   Yes No   Sig: Take by mouth   albuterol (PROVENTIL HFA,VENTOLIN HFA, PROAIR HFA) 108 (90 Base) MCG/ACT inhaler   No No   Sig: Inhale 2 puffs into the lungs every 6 (six) hours as needed for Wheezing or Shortness of breath   albuterol (PROVENTIL) (2.5 MG/3ML) 0.083% nebulizer solution   No No   Sig: Take 3 mLs by nebulization every 6 (six) hours as needed for Wheezing   aluminum-magnesium hydroxide (MAALOX) 200-200 mg/5 mL suspension   No No   Sig: Take 30 mLs by mouth every 6 (six) hours as needed for Indigestion   aspirin 81 MG tablet   Yes No   Sig: Take 81 mg by mouth daily.   atorvastatin (LIPITOR) 20 MG tablet   No No   Sig: take 1 tablet by mouth once daily   cholecalciferol (VITAMIN D3) 1000 UNIT tablet   Yes No   Sig: Take 1 tablet by mouth daily   fluticasone (FLONASE) 50 MCG/ACT nasal spray   No No   Sig: 1 spray by Each Nostril route daily   lidocaine (XYLOCAINE) 2 % solution   No No   Sig: Take 10 mLs by mouth as needed for Pain Use as directed for abd pain   lisinopril (PRINIVIL,ZESTRIL) 20 MG tablet   No No   Sig: take 1 tablet by mouth once daily   metFORMIN (GLUCOPHAGE-XR) 500 MG 24 hr tablet   No No   Sig: take 4 tablets by mouth once daily WITH BREAKFAST   metoclopramide (REGLAN) 10 MG tablet   No No   Sig: Take 1 tablet by mouth every 6 (six) hours as needed (nausea)   metoprolol (TOPROL-XL) 25 MG 24 hr tablet   No No   Sig: Take 1 tablet by mouth daily   omeprazole (PRILOSEC) 40 MG capsule   No No   Sig: Take 1 capsule by mouth 2 (two) times daily take 1 capsule by mouth twice a day   oxycodone-acetaminophen (PERCOCET) 5-325 MG per tablet   Yes No   Sig: Take 1 tablet by  mouth 2 (two) times daily as needed With Dr Dwyane Dee   pregabalin (LYRICA) 100 MG capsule   Yes No   Sig: Take 100 mg by mouth 2 (two) times daily With Dr Dwyane Dee   ranitidine (ZANTAC) 75 MG tablet   No No   Sig: Take 1 tablet PO at bedtime   sucralfate (CARAFATE) 1 GM/10ML suspension   No No   Sig: Take 10 mLs by mouth 4 (four) times daily      Facility-Administered Medications: None     Physical Exam:   06/18/18  2016 06/18/18  2100 06/18/18  2225   BP: 144/81     Pulse: 91  85   Resp: 16 16    Temp: 98.9 F 98 F    SpO2: 98%  98%     GENERAL:  Appears uncomfortable, WN, no acute distress, non-toxic   SKIN:  Warm & dry, no rash  HEENT:  NCAT; EOMI. Sclerae are anicteric and aninjected  LUNGS:  Clear to auscultation bilaterally. No wheezes, rales, rhonchi  HEART:  RRR.  No murmurs, rubs, or gallops  ABDOMEN:  Soft, NTND.  No masses.  No involuntary guarding or rebound   MSK/EXTREMITIES:  No obvious deformities.  Warm and well perfused.  No cyanosis, no edema  GENITOURINARY:  No CVA tenderness  NEUROLOGIC:  Alert, oriented; moves all extremities; speaking in sentences. Normal gait without ataxia  PSYCHIATRIC:  Appropriate for age, time of day, and situation    Medications Given in the ED:  Medications   sodium chloride 0.9 % IV bolus 1,000 mL (1,000 mLs Intravenous New Bag 06/18/18 2043)   famotidine (PEPCID) injection 20 mg (20 mg Intravenous Given 06/18/18 2043)   lidocaine (XYLOCAINE) 2 % viscous solution 10 mL (10 mLs Mouth/Throat Given 06/18/18 2043)   aluminum-magnesium hydroxide (MAALOX) 200-200 mg/5 mL suspension 30 mL (30 mLs Oral Given 06/18/18 2043)   metoclopramide (REGLAN) injection 10 mg (10 mg Intravenous Given 06/18/18 2043)   sucralfate (CARAFATE) tablet 1 g (1 g Oral Given 06/18/18 2043)    Radiology Results:  n/a   Lab Results (abnormal results only):  Labs Reviewed   HEPATIC FUNCTION PANEL - Abnormal; Notable for the following components:       Result Value    BILIRUBIN TOTAL 0.1 (*)     INDIRECT BILIRUBIN  0.0 (*)     All other components within normal limits   CBC, PLATELET & DIFFERENTIAL   BASIC METABOLIC PANEL   MAGNESIUM   LIPASE    Other Results/Old Record review (e.g. ECG):  n/a     ED Course and Medical Decision-making:  Patient's past medical history/problem list, past surgical history, medication list, social history and allergies reviewed. Pertinent labs & imaging studies reviewed.  Prior records reviewed. Nursing notes reviewed.    Samantha Cameron is a 62 year old female who presented at 2009 for evaluation of nausea, abdominal pain.    Patient appears uncomfortable, in no acute distress on presentation.  Afebrile, hemodynamically stable. Physical exam as above.     Presentation overall reassuring, she is without abdominal tenderness to suggest hepatitis/cholecystitis/pancreatitis/gastritis/appendicitis/colitis/diverticulitis/obstruction.    Blood work including CBC, BMP, magnesium, lipase, LFTs unrevealing.    On reassessment, feeling much improved following pepcid/GI cocktail/carafate/reglan/IVF. Etiology of symptoms not immediately clear, follow up with GI as scheduled recommended.    Stable for discharge.    Patient/parent/guardian has been provided with precautions to return to the ED for new or worsening symptoms, or if unable to obtain follow up care.     Condition on Discharge: Improved and stable    Diagnosis/Diagnoses:   Generalized abdominal pain  Nausea    Dianna Limbo, PA-C, 06/18/2018 9:13 PM  This Emergency Department patient encounter note was created using voice-recognition software and in real time during the ED visit.

## 2018-06-18 NOTE — Discharge Instructions (Addendum)
You or your loved one was seen in a [facility] Emergency Department for abdominal pain, nausea.  Your evaluation is reassuring that you do not require hospital admission today.    Call your primary care provider tomorrow to let them know about your emergency department visit today.  Call your GI provider and make sure to keep your appointment next week.    If you do not have a primary care doctor or would like to transfer your primary care to [facility], please call [phone] to set up an appointment.    Return to the emergency room for new or worsening symptoms including chest pain, shortness of breath, vomiting where you cannot tolerate fluids, ongoing or worsening abdominal pain, or if you are unable to schedule follow up care.

## 2018-06-18 NOTE — Narrator Note (Signed)
Patient Disposition    Patient education for diagnosis, medications, activity, diet and follow-up.  Patient left ED 10:40 PM.  Patient rep received written instructions.  Interpreter to provide instructions: No    Patient belongings with patient: YES    Have all existing LDAs been addressed? Yes    Have all IV infusions been stopped? Yes    Discharged to: Discharged to home.  Pt provided with dc instructions, teaching, and follow up care.  Pt verbalizes understanding of dc.  Skin w/d appropriate for ethnicity.  In no apparent distress, speaking in full sentences.  Ambulates out of ED with steady gait.

## 2018-06-18 NOTE — ED Triage Note (Signed)
Pt self presents with generalized abd pain and nausea. Describes the pain as burning. Pt states she has GERD and 3 pancreatic lesions with a scheduled appt on Monday for a scope. The pt attempted maaolx with lido and mylanta at 1600 with no relief. Pt is here for pain relief. NAD Aox4

## 2018-06-19 ENCOUNTER — Telehealth (HOSPITAL_BASED_OUTPATIENT_CLINIC_OR_DEPARTMENT_OTHER): Payer: Self-pay

## 2018-06-19 ENCOUNTER — Telehealth (HOSPITAL_BASED_OUTPATIENT_CLINIC_OR_DEPARTMENT_OTHER): Payer: Self-pay | Admitting: Ambulatory Care

## 2018-06-19 DIAGNOSIS — K219 Gastro-esophageal reflux disease without esophagitis: Secondary | ICD-10-CM

## 2018-06-19 DIAGNOSIS — K2970 Gastritis, unspecified, without bleeding: Secondary | ICD-10-CM

## 2018-06-19 MED ORDER — LIDOCAINE VISCOUS 2 % MT SOLN: 10 mL | mL | 2 refills | 0 days | Status: DC | PRN

## 2018-06-19 MED ORDER — LIDOCAINE VISCOUS 2 % MT SOLN
10.0000 mL | OROMUCOSAL | 2 refills | Status: DC | PRN
Start: 2018-06-19 — End: 2018-07-08

## 2018-06-19 NOTE — Progress Notes (Signed)
Called pharmacy back and advised them of frequency-every 8 hours as needed.

## 2018-06-19 NOTE — Telephone Encounter (Signed)
-----   Message from Kendall Flack sent at 06/19/2018 11:05 AM EDT -----  Regarding: RE prescription   Contact: [phone]  KEISHANA KLINGER [id number], 62 year old, female    Calls today:  Clinical Questions ([place])    Name of person calling Remo Lipps from [employer]   Specific nature of request Remo Lipps is calling regarding prescription she received for lidocaine. Remo Lipps will like to verify the direction on the prescription. Thank you   Return phone number [phone]  Person calling on behalf of patient: Pharmacy    Patient's language of care: English    Patient does not need an interpreter.    Patient's PCP: Driscilla Grammes, MD

## 2018-06-19 NOTE — Progress Notes (Signed)
Please give verbal auth for q8h prn pain

## 2018-06-19 NOTE — Progress Notes (Signed)
Received message from pharmacy regarding lidocaine Rx, likely needs frequency, will forward to provider to advise.

## 2018-06-19 NOTE — Progress Notes (Signed)
rx sent  Agree with plan

## 2018-06-19 NOTE — Progress Notes (Signed)
Called to follow up with pt after being seen in ED on 7/31 with c/o's of abd pain and nausea after eating a bowl of frosted flakes cereal. Pt took her prescribed lidocaine and maalox at home with no relief.  Pt treated and D/c to home with scheduled endoscopy planned for 8/5. Pt states she is frustrated that everything she eats bothers her stomach. Pt encouraged to maintain a journal of foods she eats and how she feels after eating them. Denies nausea, vomiting or burning pain at this time. Encouraged to eat bland diet until upcoming endoscopy.   Pt agreeable with this plan. All medications reviewed and unchanged.  Pt participating in upcoming sleep study tomorrow at [facility].      Pt requesting another prescription refill for lidocaine to get her through the weekend. She only has I dose left.   Pt informed that provider will be made aware.    Arnette Felts, RN, 06/19/2018

## 2018-06-20 ENCOUNTER — Other Ambulatory Visit (HOSPITAL_BASED_OUTPATIENT_CLINIC_OR_DEPARTMENT_OTHER): Payer: Self-pay

## 2018-06-20 ENCOUNTER — Ambulatory Visit: Payer: Medicare Other | Attending: Internal Medicine

## 2018-06-20 DIAGNOSIS — R0600 Dyspnea, unspecified: Secondary | ICD-10-CM | POA: Insufficient documentation

## 2018-06-20 DIAGNOSIS — G4733 Obstructive sleep apnea (adult) (pediatric): Secondary | ICD-10-CM

## 2018-06-20 DIAGNOSIS — R002 Palpitations: Secondary | ICD-10-CM

## 2018-06-20 DIAGNOSIS — R0601 Orthopnea: Secondary | ICD-10-CM

## 2018-06-21 ENCOUNTER — Other Ambulatory Visit (HOSPITAL_BASED_OUTPATIENT_CLINIC_OR_DEPARTMENT_OTHER): Payer: Self-pay | Admitting: Internal Medicine

## 2018-06-21 DIAGNOSIS — K219 Gastro-esophageal reflux disease without esophagitis: Secondary | ICD-10-CM

## 2018-06-21 DIAGNOSIS — K2970 Gastritis, unspecified, without bleeding: Secondary | ICD-10-CM

## 2018-06-21 NOTE — Progress Notes (Signed)
med on file spoke to [place] at [employer]

## 2018-06-23 ENCOUNTER — Ambulatory Visit (HOSPITAL_BASED_OUTPATIENT_CLINIC_OR_DEPARTMENT_OTHER): Payer: Medicare Other | Admitting: Anesthesiology

## 2018-06-23 ENCOUNTER — Ambulatory Visit
Admission: RE | Admit: 2018-06-23 | Discharge: 2018-06-23 | Disposition: A | Discharge status: Home / Self Care | Payer: Medicare Other | Attending: Internal Medicine | Admitting: Internal Medicine

## 2018-06-23 ENCOUNTER — Encounter (HOSPITAL_BASED_OUTPATIENT_CLINIC_OR_DEPARTMENT_OTHER): Payer: Self-pay

## 2018-06-23 DIAGNOSIS — Z7982 Long term (current) use of aspirin: Secondary | ICD-10-CM | POA: Diagnosis not present

## 2018-06-23 DIAGNOSIS — K449 Diaphragmatic hernia without obstruction or gangrene: Secondary | ICD-10-CM | POA: Insufficient documentation

## 2018-06-23 DIAGNOSIS — E785 Hyperlipidemia, unspecified: Secondary | ICD-10-CM | POA: Diagnosis not present

## 2018-06-23 DIAGNOSIS — K30 Functional dyspepsia: Secondary | ICD-10-CM | POA: Diagnosis not present

## 2018-06-23 DIAGNOSIS — Z6841 Body Mass Index (BMI) 40.0 and over, adult: Secondary | ICD-10-CM | POA: Insufficient documentation

## 2018-06-23 DIAGNOSIS — Z87891 Personal history of nicotine dependence: Secondary | ICD-10-CM | POA: Diagnosis not present

## 2018-06-23 DIAGNOSIS — R1013 Epigastric pain: Secondary | ICD-10-CM | POA: Diagnosis present

## 2018-06-23 DIAGNOSIS — K209 Esophagitis, unspecified: Secondary | ICD-10-CM | POA: Diagnosis not present

## 2018-06-23 DIAGNOSIS — K210 Gastro-esophageal reflux disease with esophagitis: Secondary | ICD-10-CM | POA: Diagnosis not present

## 2018-06-23 DIAGNOSIS — R12 Heartburn: Secondary | ICD-10-CM

## 2018-06-23 DIAGNOSIS — I10 Essential (primary) hypertension: Secondary | ICD-10-CM | POA: Insufficient documentation

## 2018-06-23 DIAGNOSIS — K2950 Unspecified chronic gastritis without bleeding: Secondary | ICD-10-CM | POA: Diagnosis not present

## 2018-06-23 DIAGNOSIS — Z7984 Long term (current) use of oral hypoglycemic drugs: Secondary | ICD-10-CM | POA: Diagnosis not present

## 2018-06-23 DIAGNOSIS — E119 Type 2 diabetes mellitus without complications: Secondary | ICD-10-CM | POA: Diagnosis not present

## 2018-06-23 DIAGNOSIS — K219 Gastro-esophageal reflux disease without esophagitis: Secondary | ICD-10-CM

## 2018-06-23 MED ORDER — PROPOFOL 200 MG/20ML IV EMUL
Freq: Once | INTRAVENOUS | Status: DC | PRN
Start: 2018-06-23 — End: 2018-06-23
  Administered 2018-06-23: 100 mg via INTRAVENOUS

## 2018-06-23 MED ORDER — LACTATED RINGERS IV SOLN
INTRAVENOUS | Status: DC
Start: 2018-06-23 — End: 2018-06-23

## 2018-06-23 MED ORDER — LACTATED RINGERS IV SOLN
INTRAVENOUS | Status: DC
Start: 2018-06-23 — End: 2018-06-24
  Administered 2018-06-23: 300 mL via INTRAVENOUS

## 2018-06-23 MED ORDER — PROPOFOL INFUSION
INTRAVENOUS | Status: DC | PRN
Start: 2018-06-23 — End: 2018-06-23
  Administered 2018-06-23: 120 ug/kg/min via INTRAVENOUS

## 2018-06-23 MED ORDER — PROPOFOL INFUSION
INTRAVENOUS | Status: DC | PRN
Start: 2018-06-23 — End: 2018-06-23
  Administered 2018-06-23: 75 ug/kg/min via INTRAVENOUS

## 2018-06-23 NOTE — Anesthesia Postprocedure Evaluation (Signed)
Anesthesia Post-Operative Evaluation Note    Patient: Samantha Cameron           Procedure Summary     Date:  06/23/18 Room / Location:  [facility] - Operating Room    Anesthesia Start:  1351 Anesthesia Stop:  3709    Procedure:  GI ESOPHAGOGASTRODUODENOSCOPY (EGD) Diagnosis:      Scheduled Providers:  Retta Diones., MD; Barton Dubois Responsible Provider:  Barton Dubois    Anesthesia Type:  general, MAC ASA Status:  3            POST-OPERATIVE EVALUATION    Anesthesia Post Evaluation    Vitals signs in patient's normal range: Yes  Respiratory function stable; airway patent: Yes  Cardiovascular function stable: Yes  Hydration status stable: Yes  Mental status recovered; patient participates in evaluation and/or is at baseline: Yes  Pain control satisfactory: Yes  Nausea and vomiting control satisfactory: Yes    Procedure was labor & delivery no  PostOP disposition PACU  Anesthesia Observation no significant observation      Last vitals  BP   (!) 88/54 (06/23/18 1407)    Temp   98.7 F (37.1 C) (06/23/18 1407)    Pulse       Resp   17 (06/23/18 1407)    SpO2 100

## 2018-06-23 NOTE — Discharge Instructions (Signed)
Post Procedure Diagnosis:       - Mildly severe reflux esophagitis. Biopsied.       - Chronic gastritis. Biopsied.       - No gross lesions in the entire examined duodenum.  Complications:        No immediate complications.  Recommendation:       - Await pathology results.       - Return to GI clinic in 3 weeks.General Anesthesia, Adult, Care After  These instructions provide you with information about caring for yourself after your procedure. Your health care provider may also give you more specific instructions. Your treatment has been planned according to current medical practices, but problems sometimes occur. Call your health care provider if you have any problems or questions after your procedure.  What can I expect after the procedure?  After the procedure, it is common to have:   Vomiting.   A sore throat.   Mental slowness.    It is common to feel:   Nauseous.   Cold or shivery.   Sleepy.   Tired.   Sore or achy, even in parts of your body where you did not have surgery.    Follow these instructions at home:  For at least 24 hours after the procedure:    Do not:  ? Participate in activities where you could fall or become injured.  ? Drive.  ? Use heavy machinery.  ? Drink alcohol.  ? Take sleeping pills or medicines that cause drowsiness.  ? Make important decisions or sign legal documents.  ? Take care of children on your own.   Rest.  Eating and drinking    If you vomit, drink water, juice, or soup when you can drink without vomiting.   Drink enough fluid to keep your urine clear or pale yellow.   Make sure you have little or no nausea before eating solid foods.   Follow the diet recommended by your health care provider.  General instructions    Have a responsible adult stay with you until you are awake and alert.   Return to your normal activities as told by your health care provider. Ask your health care provider what activities are safe for you.   Take over-the-counter and prescription  medicines only as told by your health care provider.   If you smoke, do not smoke without supervision.   Keep all follow-up visits as told by your health care provider. This is important.  Contact a health care provider if:   You continue to have nausea or vomiting at home, and medicines are not helpful.   You cannot drink fluids or start eating again.   You cannot urinate after 8-12 hours.   You develop a skin rash.   You have fever.   You have increasing redness at the site of your procedure.  Get help right away if:   You have difficulty breathing.   You have chest pain.   You have unexpected bleeding.   You feel that you are having a life-threatening or urgent problem.  This information is not intended to replace advice given to you by your health care provider. Make sure you discuss any questions you have with your health care provider.  Document Released: 02/11/2001 Document Revised: 04/09/2016 Document Reviewed: 10/20/2015  Elsevier Interactive Patient Education  2017 [facility], Care After  Refer to this sheet in the next few weeks. These instructions provide you with information  about caring for yourself after your procedure. Your health care provider may also give you more specific instructions. Your treatment has been planned according to current medical practices, but problems sometimes occur. Call your health care provider if you have any problems or questions after your procedure.  What can I expect after the procedure?  After the procedure, it is common to have:   A sore throat.   Bloating.   Nausea.    Follow these instructions at home:   Follow instructions from your health care provider about what to eat or drink after your procedure.   Return to your normal activities as told by your health care provider. Ask your health care provider what activities are safe for you.   Take over-the-counter and prescription medicines only as told by your health care  provider.   Do not drive for 24 hours if you received a sedative.   Keep all follow-up visits as told by your health care provider. This is important.  Contact a health care provider if:   You have a sore throat that lasts longer than one day.   You have trouble swallowing.  Get help right away if:   You have a fever.   You vomit blood or your vomit looks like coffee grounds.   You have bloody, black, or tarry stools.   You have a severe sore throat or you cannot swallow.   You have difficulty breathing.   You have severe pain in your chest or belly.  This information is not intended to replace advice given to you by your health care provider. Make sure you discuss any questions you have with your health care provider.  Document Released: 05/06/2012 Document Revised: 04/12/2016 Document Reviewed: 08/18/2015  Elsevier Interactive Patient Education  2017 [employer].

## 2018-06-23 NOTE — Addendum Note (Signed)
Addendum  created 06/23/18 1412 by Barton Dubois    Order list changed, Order sets accessed

## 2018-06-23 NOTE — Anesthesia Preprocedure Evaluation (Signed)
Pre-Anesthetic Note        Patient: Samantha Cameron is a 62 year old female      Procedure Information     Date/Time:  06/23/18 1250    Scheduled providers:  Retta Diones., MD; Barton Dubois    Procedure:  GI ESOPHAGOGASTRODUODENOSCOPY (EGD)    Location:  [facility] - Operating Room          Relevant Problems   NEURO/PSYCH   (+) History of vitamin D deficiency   (+) Hx of gastritis      CARDIO   (+) Hemorrhoids   (+) Vascular calcification   (+) Venous (peripheral) insufficiency      GI   (+) Gastroesophageal reflux disease without esophagitis      GU/RENAL   (+) Fatty liver      ENDO   (+) Type 2 diabetes mellitus with microalbuminuria, without long-term current use of insulin (HCC)      Other   (+) Arthritis of spine       Previous Anesthetic History:   Past Surgical History:  No date: ANES NERVE MUSC TENDON FASCIA&BURSA KNEE&/POPLT  No date: FOOT SURGERY      Comment:  bilateral  No date: LAPAROSCOPY SURG CHOLECYSTECTOMY  No date: OB ANTEPARTUM CARE CESAREAN DLVR & POSTPARTUM      Comment:  x3  No date: TONSILLECTOMY & ADENOIDECTOMY <AGE 8  04/13/2009: TOTAL KNEE REPLACEMENT      Comment:  left  08/11/2009: WRIST GANGLION EXCISION      Comment:  left     Current Medications:      Current Outpatient Medications:  lidocaine (XYLOCAINE) 2 % solution Take 10 mLs by mouth as needed for Pain Use as directed for abd pain Disp: 200 mL Rfl: 2   omeprazole (PRILOSEC) 40 MG capsule Take 1 capsule by mouth 2 (two) times daily take 1 capsule by mouth twice a day Disp: 180 capsule Rfl: 3   aluminum-magnesium hydroxide (MAALOX) 200-200 mg/5 mL suspension Take 30 mLs by mouth every 6 (six) hours as needed for Indigestion Disp: 300 mL Rfl: 1   ranitidine (ZANTAC) 75 MG tablet Take 1 tablet PO at bedtime Disp: 30 tablet Rfl: 2   sucralfate (CARAFATE) 1 GM/10ML suspension Take 10 mLs by mouth 4 (four) times daily Disp: 1200 mL Rfl: 1   fluticasone (FLONASE) 50 MCG/ACT nasal spray 1 spray by Each Nostril route daily  Disp: 1 Bottle Rfl: 11   metoprolol (TOPROL-XL) 25 MG 24 hr tablet Take 1 tablet by mouth daily Disp: 90 tablet Rfl: 1   lisinopril (PRINIVIL,ZESTRIL) 20 MG tablet take 1 tablet by mouth once daily Disp: 90 tablet Rfl: 3   metFORMIN (GLUCOPHAGE-XR) 500 MG 24 hr tablet take 4 tablets by mouth once daily WITH BREAKFAST Disp: 360 tablet Rfl: 3   atorvastatin (LIPITOR) 20 MG tablet take 1 tablet by mouth once daily Disp: 90 tablet Rfl: 3   Omega-3 Fatty Acids (FISH OIL) 1000 MG CAPS Take by mouth Disp:  Rfl:    cholecalciferol (VITAMIN D3) 1000 UNIT tablet Take 1 tablet by mouth daily Disp:  Rfl:    pregabalin (LYRICA) 100 MG capsule Take 100 mg by mouth 2 (two) times daily With Dr Dwyane Dee Disp:  Rfl:    oxycodone-acetaminophen (PERCOCET) 5-325 MG per tablet Take 1 tablet by mouth 2 (two) times daily as needed With Dr Dwyane Dee Disp:  Rfl:    aspirin 81 MG tablet Take 81 mg by mouth daily.  Disp:  Rfl:    metoclopramide (REGLAN) 10 MG tablet Take 1 tablet by mouth every 6 (six) hours as needed (nausea) Disp: 60 tablet Rfl: 1   albuterol (PROVENTIL) (2.5 MG/3ML) 0.083% nebulizer solution Take 3 mLs by nebulization every 6 (six) hours as needed for Wheezing Disp: 60 mL Rfl: 1   ONETOUCH DELICA LANCETS FINE MISC TEST once daily Disp: 100 each Rfl: 11   ONETOUCH VERIO test strip TEST once daily Disp: 100 strip Rfl: 11   albuterol (PROVENTIL HFA,VENTOLIN HFA, PROAIR HFA) 108 (90 Base) MCG/ACT inhaler Inhale 2 puffs into the lungs every 6 (six) hours as needed for Wheezing or Shortness of breath Disp: 1 Inhaler Rfl: 11     Current Facility-Administered Medications:  lactated ringers infusion  Intravenous Continuous Aireal Slater       Home Medications    (Not in a hospital admission)    Allergies:   Review of Patient's Allergies indicates:   Codeine camsylate       Shortness of Breath, Other (See                            Comments)    Comment:Chest pain   Motrin [ibuprofen]      Nausea Only   Sulfa antibiotics       Other (See  Comments)   Bactrim                 Rash, Itching    Smoking, Alcohol, Drugs:  Social History    Tobacco Use      Smoking status: Former Smoker        Packs/day: 0.50        Years: 31.00        Pack years: 15.5        Types: Cigarettes        Quit date: 03/10/2018        Years since quitting: 0.2      Smokeless tobacco: Never Used      Tobacco comment: quit smoking inform provided to patient    Alcohol use: No      Drug use: No       PMHx:  Past Medical History:  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) ([place])  09/14/2015: Cough  No date: Depression      Comment:  per steward records  No date: Diabetes  No date: Disorders of lipoid metabolism  No date: Esophageal reflux  No date: External hemorrhoid      Comment:  per steward records  No date: HTN (hypertension)  02/01/2012: Hypoxia  02/16/2017: Left ear pain  06/07/2015: Left genital labial abscess  06/16/2015: Obesity, Class III, 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y) ([place])  08/16/2015: Osteopenia      Comment:  On xray of left knee per steward records 06/2014 Unclear                if addressed  05/23/2018: Pancreatic lesion  09/14/2015: Right ear impacted cerumen  08/15/2015: Type 2 diabetes mellitus without complication, without   long-term current use of insulin (HCC)      Comment:   HEMOGLOBIN A1C (%) Date Value 07/15/2015 6.2 (H)                ----------  Steward  record - a1c 6.2 on 01/31/15 6.4 on                09/30/14 5.9 on 12/29/13 6.4 08/18/13   11/18/2017: Viral URI with cough    Vitals  BP 110/76  Pulse 78  Temp 97.8 F (36.6 C) (Temporal)  Resp 18  Ht _0  (1.575 m)  Wt 102.5 kg (226 lb)  LMP 10/22/2007 (LMP Unknown)  SpO2 96%  BMI 41.34 kg/m2      Physical Exam    General     Level of consciousness:  Alert   Airway     Mallampati:  III    TM distance:  >3 FB    Mouth opening:  >3 FB    Neck ROM:  Full   Teeth  - normal exam  }   Heart      Lungs          Review of Systems     Patient summary reviewed    Anesthetic Complications: negative anesthesia history ROS        Pertinent Labs:   Lab Results   Component Value Date    NA 141 06/18/2018    K 4.5 06/18/2018    CREAT 0.7 06/18/2018    GLUCOSER 97 06/18/2018    WBC 10.0 06/18/2018    HCT 40.9 06/18/2018    PLTA 297 06/18/2018    PT 11.0 04/18/2017    APTT 30.5 04/18/2017    INR 1.0 04/18/2017         Anesthesia Plan    ASA Score:     ASA:  3    Airway:      Mallampati:  III    Mouth opening:  >3 FB    Neck ROM:  Full    TM distance:  >3 FB    Plan: general and MAC    Other information:     EKG Reviewed: : Yes      Full Stomach Precaution:: No      Post-Plan::  Floor    Informed Consent:     Anesthetic plan and risks discussed with:  Patient   Patient Consented

## 2018-06-23 NOTE — PROVATION-GI (Signed)
[facility]  Patient Name: Samantha Cameron  MRN: [id number]  CSN: [id number]  Date of Birth: 1955/12/24  Admit Type: Outpatient  Age: 62  Gender: Female  Note Status: Finalized  Patient Location: [facility]  Referring MD:         Driscilla Grammes, MD  Procedure Date:       06/23/2018 1:41:19 PM  Procedure:            Upper GI endoscopy  Endoscopist:          Retta Diones, MD  Indications for Procedure:       Dyspepsia, Heartburn  Medications:          Monitored Anesthesia Care  Procedure:       Just prior to the procedure, an updated history and physical was done. I        obtained an informed consent from the patient reviewing the risk of the        procedure including (but not limited to) respiratory depression, perforation,        bleeding, discomfort, a possible need for surgery and unexpected reactions to        medications. The patient is aware that test has limitations and may not        detect significant lesions such as cancer or other potential diseases. The        patient was also informed that they might need a repeat upper endoscopy        earlier than standard guidelines if there are changes in their symptoms or        concerning findings noted. A time out was performed with the entire procedure        staff present. The scope was passed under direct vision. Throughout the        procedure, the patient's blood pressure, pulse, and oxygen saturations were        monitored continuously. The [id number] was introduced through the        mouth, and advanced to the third part of duodenum. The upper GI endoscopy was        accomplished without difficulty. The patient tolerated the procedure well.  Findings:       Mildly severe esophagitis with no bleeding was found. Biopsies were taken        with a cold forceps for histology. Estimated blood loss was minimal.       Scattered mild inflammation characterized by erosions and erythema was found        in the gastric antrum. Biopsies were taken  with a cold forceps for histology.        Estimated blood loss was minimal.       No gross lesions were noted in the entire examined duodenum.  Post Procedure Diagnosis:       - Mildly severe reflux esophagitis. Biopsied.       - Chronic gastritis. Biopsied.       - No gross lesions in the entire examined duodenum.  Complications:        No immediate complications.  Recommendation:       - Await pathology results.       - Return to GI clinic in 3 weeks.  Retta Diones., MD  [facility] Toniann Fail, MD  06/23/2018 2:16:26 PM  This report has been signed electronically.  Number of Addenda: 0  Note Initiated On: 06/23/2018 1:41 PM

## 2018-06-23 NOTE — H&P (Signed)
GI Pre-procedure History and Physical Short Form  Samantha Cameron is an 62 year old female.    Chief Complaint: She is here for Upper GI endoscopy    HPI: 62 year old female with severe midepigastric burning and abdominal discomfort.    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) ([place])     Tobacco use disorder     Spinal stenosis of lumbar region     HLD (hyperlipidemia)     Chronic bilateral low back pain with bilateral sciatica     Skin change     Pineal gland cyst     History of vitamin D deficiency     Hemorrhoids     Venous (peripheral) insufficiency     Adrenal adenoma     Type 2 diabetes mellitus with microalbuminuria, without long-term current use of insulin (HCC)     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 (HCC)     Acute pain of right wrist     Abnormal PFTs     Screening for malignant neoplasm of breast     Skin irritation     H/O bone density study     Vertigo     Orthostatic hypotension     Social problem     Right arm weakness     Insomnia     Acute pain of left knee     Hx of total knee replacement, left     Acute bacterial sinusitis     Seasonal allergic rhinitis     Left foot pain     Stress due to family tension     Gastritis without bleeding     Leucocytosis     Gastroesophageal reflux disease without esophagitis      HOSP-TO-HOME COMMUNITY COLLABORATION PROGRAM     Epigastric pain     Hx of gastritis     Common bile duct dilation     Fatty liver     Pancreatic lesion     Vascular calcification     Umbilical hernia without obstruction and without gangrene     Arthritis of spine     Scoliosis of thoracic spine     Anxiety     Sleep disturbance      Past Medical History:   Past Medical History:  No date: 1st MTP arthritis      Comment:  per steward records, xray 11/2011  02/16/2017: Acute bacterial sinusitis  09/14/2015: Acute nonintractable headache  No date: Arthritis  No date: Back pain  No date: Bilateral knee  pain      Comment:  per steward records, mri - see scanned - tear meniscus                right, politeal cyst, mcl sprain 06/2014, s/p left knee                replacement. 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) ([place])  09/14/2015: Cough  No date: Depression      Comment:  per steward records  No date: Diabetes  No date: Disorders of lipoid metabolism  No date: Esophageal reflux  No  date: External hemorrhoid      Comment:  per steward records  No date: HTN (hypertension)  02/01/2012: Hypoxia  02/16/2017: Left ear pain  06/07/2015: Left genital labial abscess  06/16/2015: Obesity, Class III, 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y) ([place])  08/16/2015: Osteopenia      Comment:  On xray of left knee per steward records 06/2014 Unclear                if addressed  05/23/2018: Pancreatic lesion  09/14/2015: Right ear impacted cerumen  08/15/2015: Type 2 diabetes mellitus without complication, without   long-term current use of insulin (HCC)      Comment:   HEMOGLOBIN A1C (%) Date Value 07/15/2015 6.2 (H)                ----------  Steward record - a1c 6.2 on 01/31/15 6.4 on                09/30/14 5.9 on 12/29/13 6.4 08/18/13   11/18/2017: Viral URI with cough    Past Surgical History:  Past Surgical History:  No date: ANES NERVE MUSC TENDON FASCIA&BURSA KNEE&/POPLT  No date: FOOT SURGERY      Comment:  bilateral  No date: LAPAROSCOPY SURG CHOLECYSTECTOMY  No date: OB ANTEPARTUM CARE CESAREAN DLVR & POSTPARTUM      Comment:  x3  No date: TONSILLECTOMY & ADENOIDECTOMY <AGE 44  04/13/2009: TOTAL KNEE REPLACEMENT      Comment:  left  08/11/2009: WRIST GANGLION EXCISION      Comment:  left     Social History:  Social History     Socioeconomic History    Marital status: Divorced     Spouse name: Not on file    Number of  children: Not on file    Years of education: Not on file    Highest education level: Not on file   Occupational History    Not on file   Social Needs    Financial resource strain: Not on file    Food insecurity:     Worry: Not on file     Inability: Not on file    Transportation needs:     Medical: Not on file     Non-medical: Not on file   Tobacco Use    Smoking status: Former Smoker     Packs/day: 0.50     Years: 31.00     Pack years: 15.50     Types: Cigarettes     Last attempt to quit: 03/10/2018     Years since quitting: 0.2    Smokeless tobacco: Never Used    Tobacco comment: quit smoking inform provided to patient   Substance and Sexual Activity    Alcohol use: No    Drug use: No    Sexual activity: Not Currently     Partners: Male     Birth control/protection: Tubal Ligation   Lifestyle    Physical activity:     Days per week: Not on file     Minutes per session: Not on file    Stress: Not on file   Relationships    Social connections:     Talks on phone: Not on file     Gets together: Not on file     Attends religious service: Not on file     Active member of club or organization: Not on file     Attends meetings of clubs or organizations: Not on file  Relationship status: Not on file    Intimate partner violence:     Fear of current or ex partner: Not on file     Emotionally abused: Not on file     Physically abused: Not on file     Forced sexual activity: Not on file   Other Topics Concern    Not on file   Social History Narrative    Lives with son    1 daughter and another son passed away    Disabled from back    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 Simons,MD, 07/15/2015, 2:56 PM            Family History:  Review of patient's family history indicates:  Problem: Diabetes       Relation: Mother          Age of Onset: (Not Specified)  Problem: Heart      Relation: Brother          Age of Onset: (Not Specified)          Comment: CAD and AAA  Problem: Hypertension      Relation: Mother          Age of Onset: (Not Specified)  Problem: Arthritis      Relation: Mother          Age of Onset: (Not Specified)  Problem: Stroke      Relation: Mother          Age of Onset: (Not Specified)  Problem: Diabetes      Relation: Brother          Age of Onset: (Not Specified)          Comment: same brother  Problem: Cancer - Other      Relation: Father          Age of Onset: (Not Specified)          Comment: type uncertain  Problem: Thyroid      Relation: Sister          Age of Onset: (Not Specified)  Problem: Alcohol/Drug Abuse      Relation: Son          Age of Onset: (Not Specified)  Problem: OTHER      Relation: Son          Age of Onset: (Not Specified)          Comment: ?brain aneurysm per steward records  Problem: No Known Problems      Relation: Grandchild          Age of Onset: (Not Specified)          Comment: 6  Problem: Cancer - Breast      Relation: FamHxNeg          Age of Onset: (Not Specified)  Problem: Cancer - Cervical      Relation: FamHxNeg          Age of Onset: (Not Specified)  Problem: Cancer - Ovarian      Relation: FamHxNeg          Age of Onset: (Not Specified)  Problem: Cancer - Colon  Relation: FamHxNeg          Age of Onset: (Not Specified)  Problem: Psychiatric Illness      Relation: FamHxNeg          Age of Onset: (Not Specified)      Allergies:   Review of Patient's Allergies indicates:   Codeine camsylate       Shortness of Breath, Other (See                            Comments)    Comment:Chest pain   Motrin [ibuprofen]      Nausea Only   Sulfa antibiotics       Other (See Comments)   Bactrim                 Rash, Itching    Medications:     (Not in a hospital admission)    Review of Systems:  Review of Systems:  HEENT: No loose teeth, nosebleeds, glaucoma, cataracts     Cardiovascular:  No chest pain, palpitations, HTN, hypercholesterolemia, MI, heart surgery or heart failure.  Pulmonary:  No pneumonia, COPD, chronic cough,TB or asthma.    Neuro:  No stroke, seizure, migraines or loss of consciousness.    Endocrine:  No diabetes or thyroid disease.    ID:  No TB, hepatitis A, hepatitis B, hepatitis C, HIV or rheumatic fever.    GU:  No hematuria, nephrolithiasis, kidney disease or bladder disease.  Heme/Onc:  No cancer, anemia or bleeding diathesis.   Derm:  No rash, hives, or shingles.    Psych: No anxiety disorder, depression, bullemia, anorexia nervosa, alcoholism or substance abuse  Musculoskeletal: No gout, arthritis, muscle disease or lupus.      Physical Exam:  Vital Signs: BP 110/76  Pulse 78  Temp 97.8 F (36.6 C) (Temporal)  Resp 18  Ht 5\' 2"  (1.575 m)  Wt 102.5 kg (226 lb)  LMP 10/22/2007 (LMP Unknown)  SpO2 96%  BMI 41.34 kg/m2  General: nml body habitus.   HEENT: Normocephalic, atrumatic, PERRLA, Extra occular motions intact. No scleral icterus. Pharynx benign. Tongue midline.  Airway Evaluation:  Gag reflex intact: Yes  Ability to open mouth wide:   Full  Dentures:  No  Loose teeth:  No  Neck range of motion  Full  Mallampati Airway Classification: Class II   The same as Class I except the tonsilar pillars are hidden by the tounge.  Pulmonary: Clear to auscultation and percussion. No rales, wheezes or ronchi.  Cardiovascular: S1, S2, no S3, S4, clicks, rubs or murmurs.  JVD not elevated with patient sitting.  Abdominal: Normal bowel sounds. No tenderness on light or deep palpation. No hepatomegally or spleenomegally.  Extremities: Without clubbing, cyanosis or edema.   Neurological: No asterixis. Sensation intact. Cranial nerves II - XII grossly intact. Normal gait.   Derm: No jaundice, hives or rashes.    ASA Classification: ASA Class II (a patient with mild systemic disease)    Impression: Patient with severe GERD symptoms.    Medical Decision Making: Plan is for  diagnostic upper endoscopy.

## 2018-06-24 ENCOUNTER — Encounter (HOSPITAL_BASED_OUTPATIENT_CLINIC_OR_DEPARTMENT_OTHER): Payer: Medicare Other

## 2018-06-24 ENCOUNTER — Encounter (HOSPITAL_BASED_OUTPATIENT_CLINIC_OR_DEPARTMENT_OTHER): Payer: Self-pay | Admitting: Physician Assistant

## 2018-06-24 DIAGNOSIS — K2100 Gastro-esophageal reflux disease with esophagitis, without bleeding: Secondary | ICD-10-CM | POA: Insufficient documentation

## 2018-06-25 ENCOUNTER — Ambulatory Visit (HOSPITAL_BASED_OUTPATIENT_CLINIC_OR_DEPARTMENT_OTHER): Payer: Medicare Other

## 2018-06-26 ENCOUNTER — Ambulatory Visit (HOSPITAL_BASED_OUTPATIENT_CLINIC_OR_DEPARTMENT_OTHER): Payer: Self-pay

## 2018-06-26 ENCOUNTER — Telehealth (HOSPITAL_BASED_OUTPATIENT_CLINIC_OR_DEPARTMENT_OTHER): Payer: Self-pay

## 2018-06-26 NOTE — Telephone Encounter (Signed)
-----  Message from [name] sent at 06/26/2018  2:35 PM EDT -----  Regarding: Dr Barnet Pall: [phone]  [place] [id number], 62 year old, female, Telephone Information:  Home Phone      [phone]  Work Phone      [phone]  Mobile          [phone]      Patient's Preferred Pharmacy:     [facility] - [address], [place]  Phone: [phone] Fax: [phone]      CONFIRMED TODAY: Samantha Cameron NUMBER: [phone]  Best time to call back: any  Cell phone:   Other phone:    Available times:    Patient's language of care: English    Patient does not need an interpreter.    Patient's PCP: Driscilla Grammes, MD    Person calling on behalf of patient: Patient (self)    Calls today with a sick call.  to speak to nurse only.    Pt is having excruciating abdominal pain around the navel. Had really bad reflux last night. Please call back, thank you

## 2018-06-26 NOTE — Telephone Encounter (Signed)
Called and spoke with patient she reports she started having some sharp upper abdominal pain about 15 minutes ago.  She had been feeling p[retty good since having scope on MOnday, last night had some throat burning which resolved and now upper abdominal pain. Had normal BM today. Ate an english muffin and banana at 11. Took her prn meds as ordered.  Advised she contact Dr Saul Fordyce office for direction, she agreed.

## 2018-06-26 NOTE — Telephone Encounter (Signed)
Regarding: abd.pain  ----- Message from Valentina Gu sent at 06/26/2018  1:45 PM EDT -----  Samantha Cameron [id number], 62 year old, female    Calls today:  Sick    What are the symptoms patient is experiencing abd.pain   How long has patient been sick? Today   What has pt. tried at home no  Person calling on behalf of patient: Patient (self)    CALL BACK NUMBER: [phone]      Patient's language of care: English    Patient does not need an interpreter.    Patient's PCP: Driscilla Grammes, MD

## 2018-06-26 NOTE — Progress Notes (Signed)
Called patient, no answer  LVM for callback

## 2018-06-27 ENCOUNTER — Encounter (HOSPITAL_BASED_OUTPATIENT_CLINIC_OR_DEPARTMENT_OTHER): Payer: Self-pay | Admitting: Internal Medicine

## 2018-06-27 DIAGNOSIS — I440 Atrioventricular block, first degree: Secondary | ICD-10-CM | POA: Insufficient documentation

## 2018-06-27 NOTE — Progress Notes (Signed)
Please send letter (do not include details of result):    Your holter monitor showed some slightly abnormal slowing of the heart during sleep.  This could be caused by sleep apnea, and could explain some of your nighttime palpitations.  I'm glad to see that you'll soon be having a sleep study to evaluate this further.

## 2018-06-28 ENCOUNTER — Emergency Department
Admission: AD | Admit: 2018-06-28 | Discharge: 2018-06-28 | Disposition: A | Discharge status: Home / Self Care | Payer: Medicare Other | Source: Intra-hospital | Attending: Student in an Organized Health Care Education/Training Program | Admitting: Student in an Organized Health Care Education/Training Program

## 2018-06-28 ENCOUNTER — Encounter (HOSPITAL_BASED_OUTPATIENT_CLINIC_OR_DEPARTMENT_OTHER): Payer: Self-pay

## 2018-06-28 DIAGNOSIS — I10 Essential (primary) hypertension: Secondary | ICD-10-CM | POA: Diagnosis present

## 2018-06-28 DIAGNOSIS — Z79899 Other long term (current) drug therapy: Secondary | ICD-10-CM | POA: Diagnosis not present

## 2018-06-28 DIAGNOSIS — E119 Type 2 diabetes mellitus without complications: Secondary | ICD-10-CM | POA: Diagnosis present

## 2018-06-28 DIAGNOSIS — R1013 Epigastric pain: Secondary | ICD-10-CM | POA: Diagnosis not present

## 2018-06-28 DIAGNOSIS — Z87891 Personal history of nicotine dependence: Secondary | ICD-10-CM | POA: Diagnosis not present

## 2018-06-28 DIAGNOSIS — R1010 Upper abdominal pain, unspecified: Secondary | ICD-10-CM

## 2018-06-28 DIAGNOSIS — Z7984 Long term (current) use of oral hypoglycemic drugs: Secondary | ICD-10-CM | POA: Diagnosis not present

## 2018-06-28 LAB — HEPATIC FUNCTION PANEL
ALANINE AMINOTRANSFERASE: 37 U/L (ref 12–45)
ALBUMIN: 4 g/dL (ref 3.4–5.0)
ALKALINE PHOSPHATASE: 99 U/L (ref 45–117)
ASPARTATE AMINOTRANSFERASE: 26 U/L (ref 8–34)
BILIRUBIN DIRECT: 0.1 mg/dl (ref 0.0–0.2)
BILIRUBIN TOTAL: 0.1 mg/dL — ABNORMAL LOW (ref 0.2–1.0)
INDIRECT BILIRUBIN: 0 mg/dL — ABNORMAL LOW (ref 0.2–0.9)
TOTAL PROTEIN: 7.5 g/dL (ref 6.4–8.2)

## 2018-06-28 LAB — POC URINALYSIS
BILIRUBIN, URINE: NEGATIVE
GLUCOSE,URINE: NEGATIVE
LEUKOCYTE ESTERASE: NEGATIVE
NITRITE, URINE: NEGATIVE
OCCULT BLOOD, URINE: NEGATIVE
PH URINE: 5 (ref 5.0–8.0)
PROTEIN, URINE: 100 — AB
SPECIFIC GRAVITY, URINE: >=1.03 (ref 1.003–1.030)
UROBILINOGEN URINE: 0.2 (ref 0.2–1.0)

## 2018-06-28 LAB — CBC, PLATELET & DIFFERENTIAL
ABSOLUTE BASO COUNT: 0 10*3/uL (ref 0.0–0.1)
ABSOLUTE EOSINOPHIL COUNT: 0.4 10*3/uL (ref 0.0–0.8)
ABSOLUTE IMM GRAN COUNT: 0.02 10*3/uL (ref 0.00–0.03)
ABSOLUTE LYMPH COUNT: 3 10*3/uL (ref 0.6–5.9)
ABSOLUTE MONO COUNT: 0.9 10*3/uL (ref 0.2–1.4)
ABSOLUTE NEUTROPHIL COUNT: 5.6 10*3/uL (ref 1.6–8.3)
BASOPHIL %: 0.3 % (ref 0.0–1.2)
EOSINOPHIL %: 3.8 % (ref 0.0–7.0)
HEMATOCRIT: 40.9 % (ref 34.1–44.9)
HEMOGLOBIN: 13.2 g/dL (ref 11.2–15.7)
IMMATURE GRANULOCYTE %: 0.2 % (ref 0.0–0.4)
LYMPHOCYTE %: 30.6 % (ref 15.0–54.0)
MEAN CORP HGB CONC: 32.3 g/dL (ref 31.0–37.0)
MEAN CORPUSCULAR HGB: 29.4 pg (ref 26.0–34.0)
MEAN CORPUSCULAR VOL: 91.1 fL (ref 80.0–100.0)
MEAN PLATELET VOLUME: 11.2 fL (ref 8.7–12.5)
MONOCYTE %: 8.7 % (ref 4.0–13.0)
NEUTROPHIL %: 56.4 % (ref 40.0–75.0)
PLATELET COUNT: 300 10*3/uL (ref 150–400)
RBC DISTRIBUTION WIDTH STD DEV: 45.9 fL (ref 35.1–46.3)
RBC DISTRIBUTION WIDTH: 14 % (ref 11.5–14.3)
RED BLOOD CELL COUNT: 4.49 M/uL (ref 3.90–5.20)
WHITE BLOOD CELL COUNT: 9.9 10*3/uL (ref 4.0–11.0)

## 2018-06-28 LAB — BASIC METABOLIC PANEL
ANION GAP: 11 mmol/L (ref 5–15)
BUN (UREA NITROGEN): 20 mg/dL — ABNORMAL HIGH (ref 7–18)
CALCIUM: 9.2 mg/dL (ref 8.5–10.1)
CARBON DIOXIDE: 28 mmol/L (ref 21–32)
CHLORIDE: 105 mmol/L (ref 98–107)
CREATININE: 0.9 mg/dL (ref 0.4–1.2)
ESTIMATED GLOMERULAR FILT RATE: >60 mL/min (ref >60)
Glucose Random: 102 mg/dL (ref 74–160)
POTASSIUM: 4.5 mmol/L (ref 3.5–5.1)
SODIUM: 144 mmol/L (ref 136–145)

## 2018-06-28 LAB — LIPASE: LIPASE: 238 U/L (ref 73–393)

## 2018-06-28 MED ORDER — SODIUM CHLORIDE 0.9 % IV BOLUS
1000.0000 mL | Freq: Once | INTRAVENOUS | Status: AC
Start: 2018-06-28 — End: 2018-06-28
  Administered 2018-06-28: 1000 mL via INTRAVENOUS

## 2018-06-28 MED ORDER — LIDOCAINE VISCOUS 2 % MT SOLN
10.0000 mL | Freq: Once | OROMUCOSAL | Status: AC
Start: 2018-06-28 — End: 2018-06-28
  Administered 2018-06-28: 10 mL via OROMUCOSAL
  Filled 2018-06-28: qty 15

## 2018-06-28 MED ORDER — METOCLOPRAMIDE HCL 5 MG/ML IJ SOLN
10.00 mg | Freq: Once | INTRAMUSCULAR | Status: AC
Start: 2018-06-28 — End: 2018-06-28
  Administered 2018-06-28: 10 mg via INTRAVENOUS
  Filled 2018-06-28: qty 2

## 2018-06-28 MED ORDER — FAMOTIDINE 20 MG PO TABS
20.0000 mg | ORAL_TABLET | Freq: Once | ORAL | Status: AC
Start: 2018-06-28 — End: 2018-06-28
  Administered 2018-06-28: 20 mg via ORAL
  Filled 2018-06-28: qty 1

## 2018-06-28 MED ORDER — DIPHENHYDRAMINE HCL 50 MG/ML IJ SOLN
25.00 mg | Freq: Once | INTRAMUSCULAR | Status: AC
Start: 2018-06-28 — End: 2018-06-28
  Administered 2018-06-28: 25 mg via INTRAVENOUS
  Filled 2018-06-28: qty 1

## 2018-06-28 MED ORDER — ALUMINUM & MAGNESIUM HYDROXIDE 200-200 MG/5ML PO SUSP
30.0000 mL | Freq: Once | ORAL | Status: AC
Start: 2018-06-28 — End: 2018-06-28
  Administered 2018-06-28: 30 mL via ORAL
  Filled 2018-06-28: qty 30

## 2018-06-28 NOTE — Progress Notes (Signed)
Results not clinically significant.  No further ED action required.

## 2018-06-28 NOTE — Narrator Note (Signed)
Patient Disposition    Patient education for diagnosis, medications, activity, diet and follow-up.  Patient left ED 11:31 PM.  Patient rep received written instructions.  Interpreter to provide instructions: No    Patient belongings with patient: YES    Have all existing LDAs been addressed? N/A    Have all IV infusions been stopped? N/A    Discharged to: Discharged to home  Pt. Verbalized understanding of all discharge and follow up care. Pt NAD Aox4 ambulated with steady gait and all belongings.

## 2018-06-28 NOTE — ED Triage Note (Signed)
Pt c/o abd pain and epi gastric burning   States she took all her meds at home and  Nothing is helping,  States she had testing done on Monday with Dr Owens Shark but doesn't know results.

## 2018-06-28 NOTE — ED Provider Notes (Signed)
[facility] Emergency Medicine Attending Note      The patient was seen primarily by me. ED nursing record was reviewed. Select prior records as available electronically through the Epic record were reviewed.    History of Present Illness:    Samantha Cameron is a 62 year old female with a history of GERD, gastritis, esophagitis, presenting with epigastric pain.  Patient is seen on an almost weekly basis in this emergency department for similar complaint.  Is followed by GI, takes Maalox, viscous lidocaine, Carafate, omeprazole, Carafate.  Recent EGD showing "mild severe's of esophagitis ".  Symptoms similar as past presentations.              Interpreter used? English Samantha Cameron   MRN: [id number]  PCP: Driscilla Grammes, MD    Arrived to the ED by: Self    History provided by: Patient     Review of Systems:  As per HPI. 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  No date: Esophageal reflux  No date: External hemorrhoid      Comment:  per steward records  No date: HTN (hypertension)  02/01/2012: Hypoxia  02/16/2017: Left ear pain  06/07/2015: Left genital labial  abscess  06/16/2015: Obesity, Class III, 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y) ([place])  08/16/2015: Osteopenia      Comment:  On xray of left knee per steward records 06/2014 Unclear                if addressed  05/23/2018: Pancreatic lesion  09/14/2015: Right ear impacted cerumen  08/15/2015: Type 2 diabetes mellitus without complication, without   long-term current use of insulin (HCC)      Comment:   HEMOGLOBIN A1C (%) Date Value 07/15/2015 6.2 (H)                ----------  Steward record - a1c 6.2 on 01/31/15 6.4 on                09/30/14 5.9 on 12/29/13 6.4 08/18/13   11/18/2017: Viral URI with cough Meds:  Current Facility-Administered Medications   Medication    sodium chloride 0.9 % IV bolus 1,000 mL    metoclopramide (REGLAN) injection 10 mg    diphenhydrAMINE (BENADRYL) injection 25 mg    lidocaine (XYLOCAINE) 2 % viscous solution 10 mL  aluminum-magnesium hydroxide (MAALOX) 200-200 mg/5 mL suspension 30 mL    famotidine (PEPCID) tablet 20 mg     Current Outpatient Medications   Medication Sig    lidocaine (XYLOCAINE) 2 % solution Take 10 mLs by mouth as needed for Pain Use as directed for abd pain    omeprazole (PRILOSEC) 40 MG capsule Take 1 capsule by mouth 2 (two) times daily take 1 capsule by mouth twice a day    aluminum-magnesium hydroxide (MAALOX) 200-200 mg/5 mL suspension Take 30 mLs by mouth every 6 (six) hours as needed for Indigestion    metoclopramide (REGLAN) 10 MG tablet Take 1 tablet by mouth every 6 (six) hours as needed (nausea)    ranitidine (ZANTAC) 75 MG tablet Take 1 tablet PO at bedtime    sucralfate (CARAFATE) 1 GM/10ML suspension Take 10 mLs by mouth 4 (four) times daily    metoprolol (TOPROL-XL) 25 MG 24 hr tablet Take 1 tablet by mouth daily    lisinopril (PRINIVIL,ZESTRIL) 20 MG tablet take 1 tablet by mouth once daily    metFORMIN (GLUCOPHAGE-XR) 500 MG 24 hr tablet take 4 tablets by mouth once daily WITH BREAKFAST    atorvastatin (LIPITOR) 20 MG tablet take 1 tablet by mouth  once daily    Omega-3 Fatty Acids (FISH OIL) 1000 MG CAPS Take by mouth    cholecalciferol (VITAMIN D3) 1000 UNIT tablet Take 1 tablet by mouth daily    pregabalin (LYRICA) 100 MG capsule Take 100 mg by mouth 2 (two) times daily With Dr Dwyane Dee    oxycodone-acetaminophen (PERCOCET) 5-325 MG per tablet Take 1 tablet by mouth 2 (two) times daily as needed With Dr Dwyane Dee    aspirin 81 MG tablet Take 81 mg by mouth daily.    fluticasone (FLONASE) 50 MCG/ACT nasal spray 1 spray by Each Nostril route daily    albuterol (PROVENTIL) (2.5 MG/3ML) 0.083% nebulizer solution Take 3 mLs by nebulization every 6 (six) hours as needed for Wheezing    ONETOUCH DELICA LANCETS FINE MISC TEST once daily    ONETOUCH VERIO test strip TEST once daily    albuterol (PROVENTIL HFA,VENTOLIN HFA, PROAIR HFA) 108 (90 Base) MCG/ACT inhaler Inhale 2 puffs into the lungs every 6 (six) hours as needed for Wheezing or Shortness of breath         Past Surgical Hx:  Past Surgical History:  No date: ANES NERVE MUSC TENDON FASCIA&BURSA KNEE&/POPLT  No date: FOOT SURGERY      Comment:  bilateral  No date: LAPAROSCOPY SURG CHOLECYSTECTOMY  No date: OB ANTEPARTUM CARE CESAREAN DLVR & POSTPARTUM      Comment:  x3  No date: TONSILLECTOMY & ADENOIDECTOMY <AGE 57  04/13/2009: TOTAL KNEE REPLACEMENT      Comment:  left  08/11/2009: WRIST GANGLION EXCISION      Comment:  left  Allergies:  Review of Patient's Allergies indicates:   Codeine camsylate       Shortness of Breath, Other (See                            Comments)    Comment:Chest pain   Motrin [ibuprofen]      Nausea Only   Sulfa antibiotics       Other (See Comments)   Bactrim                 Rash, Itching   Social Hx:  Social History    Tobacco  Use      Smoking status: Former Smoker        Packs/day: 0.50        Years: 31.00        Pack years: 15.5        Types: Cigarettes        Quit date: 03/10/2018        Years since quitting: 0.3      Smokeless tobacco: Never Used      Tobacco comment: quit  smoking inform provided to patient    Alcohol use: No   Immunizations:  Immunization History   Administered Date(s) Administered    HEP B ADULT 3 DOSE 20 and > 08/15/2015, 09/14/2015, 05/17/2016    INFLUENZA VIRUS TRI W/PRESV VACCINE 18/> YRS IM (PRIVATE) 10/14/2007, 09/17/2008, 09/01/2009, 08/07/2010, 08/14/2011, 09/03/2011, 09/12/2012, 08/18/2013, 08/24/2014, 06/30/2015    Influenza Virus Quad Presv Free Vacc 3/> Yrs IM 06/30/2015, 12/11/2016, 07/02/2017    Influenza Virus Quadrivalent Vacc 3/> Yrs Im 06/30/2015, 07/02/2017    Influenza Virus Tri Presv Free 3/> YRS IM 07/03/2016    PNEUMOCOCCAL POLYSACCHARIDE VACCINE v23 03/12/2014, 08/15/2015, 12/01/2015    Pneumococcal Vaccine, Conjugate V7 01/31/2009    Td 11/01/2001    Tdap 09/17/2008, 12/11/2016    ZOSTER SHINGLES VACC, LIVE SC 08/24/2008, 12/01/2015    Zoster Vacc (HZV) Recomb Adjv, IM, 2 Dose, (SHINGRIX) 05/30/2017, 07/17/2017        Physical Examination:    ED Triage Vitals [06/28/18 2047]   ED Triage Vitals Brief Group      Temp 98.7 F      Pulse 93      Resp 18      BP 104/68      SpO2 97 %      Pain Score 10         General: No acute distress,  Head: NCAT  Eyes: Anicteric, EOMI  ENT: Oropharynx clear, MMM  Neck: Supple,   CV: RRR  Lungs: Unlabored, CTAB  Abdomen: Nondistended soft nontender   Extremities: Warm, well perfused, pulse intact  Neuro: Alert, follows commands  Psych: Normal mood, cooperative    Medications Given in the ED:    Medications   sodium chloride 0.9 % IV bolus 1,000 mL (has no administration in time range)   metoclopramide (REGLAN) injection 10 mg (has no administration in time range)   diphenhydrAMINE (BENADRYL) injection 25 mg (has no administration in time range)   lidocaine (XYLOCAINE) 2 % viscous solution 10 mL (has no administration in time range)   aluminum-magnesium hydroxide (MAALOX) 200-200 mg/5 mL suspension 30 mL (has no administration in time range)   famotidine (PEPCID) tablet 20 mg (has no administration  in time range)    Radiology and ECG:    No orders to display        Lab Results:    Labs Reviewed   POC URINALYSIS - Abnormal; Notable for the following components:       Result Value    KETONE, URINE TRACE (*)     PROTEIN, URINE 100 (*)     All other components within normal limits   CBC, PLATELET & DIFFERENTIAL   BASIC METABOLIC PANEL   HEPATIC FUNCTION PANEL   LIPASE    Vital Signs:     06/28/18  2047   BP: 104/68   Pulse: 93   Resp: 18   Temp: 98.7 F   SpO2: 97%   Weight: 102.5 kg (226 lb)  ED Course and Medical Decision Making:    43 year oldfemale presenting with epigastric pain.  Vital signs stable.  On exam, patient is uncomfortable appearing but no acute distress, she does have some mild tenderness to palpation in the epigastrium.  Suspect acute on chronic exacerbation of patient's chronic epigastric pain likely secondary to GERD.  Plan for labs,    Labs within normal limits.  Symptoms largely resolved with treatment.  Will discharge home with recognitions follow-up with gastroenterologist       Patient/family educated on their diagnosis, she verbalizes understanding and agrees with plan of care. She was told to follow up with her primary care physician. I reviewed with her reasons to return to the Emergency Department, all questions were answered.    ED Disposition:     Impression(s):  Pain of upper abdomen    Disposition:  Discharged home    ED Prescriptions:      Medication List      ASK your doctor about these medications    * albuterol 108 (90 Base) MCG/ACT inhaler  Commonly known as:  PROVENTIL HFA,VENTOLIN HFA, PROAIR HFA  Inhale 2 puffs into the lungs every 6 (six) hours as needed for Wheezing or Shortness of breath     * albuterol (2.5 MG/3ML) 0.083% nebulizer solution  Commonly known as:  PROVENTIL  Take 3 mLs by nebulization every 6 (six) hours as needed for Wheezing     aluminum-magnesium hydroxide 200-200 mg/5 mL suspension  Commonly known as:  MAALOX  Take 30 mLs by mouth every 6  (six) hours as needed for Indigestion     aspirin 81 MG tablet     atorvastatin 20 MG tablet  Commonly known as:  LIPITOR  take 1 tablet by mouth once daily     cholecalciferol 1000 UNIT tablet  Commonly known as:  VITAMIN D3     Fish Oil 1000 MG Caps     fluticasone 50 MCG/ACT nasal spray  Commonly known as:  FLONASE  1 spray by Each Nostril route daily     lidocaine 2 % solution  Commonly known as:  XYLOCAINE  Take 10 mLs by mouth as needed for Pain Use as directed for abd pain     lisinopril 20 MG tablet  Commonly known as:  PRINIVIL,ZESTRIL  take 1 tablet by mouth once daily     metFORMIN 500 MG 24 hr tablet  Commonly known as:  GLUCOPHAGE-XR  take 4 tablets by mouth once daily WITH BREAKFAST     metoclopramide 10 MG tablet  Commonly known as:  REGLAN  Take 1 tablet by mouth every 6 (six) hours as needed (nausea)     metoprolol 25 MG 24 hr tablet  Commonly known as:  TOPROL-XL  Take 1 tablet by mouth daily     omeprazole 40 MG capsule  Commonly known as:  PriLOSEC  Take 1 capsule by mouth 2 (two) times daily take 1 capsule by mouth twice a day     ONETOUCH DELICA LANCETS FINE Misc  TEST once daily     ONETOUCH VERIO test strip  Generic drug:  glucose blood  TEST once daily     oxyCODONE-acetaminophen 5-325 MG per tablet  Commonly known as:  PERCOCET     pregabalin 100 MG capsule  Commonly known as:  LYRICA     ranitidine 75 MG tablet  Commonly known as:  ZANTAC  Take 1 tablet PO at bedtime     sucralfate 1 GM/10ML  suspension  Commonly known as:  CARAFATE  Take 10 mLs by mouth 4 (four) times daily         * This list has 2 medication(s) that are the same as other medications prescribed for you. Read the directions carefully, and ask your doctor or other care provider to review them with you.                      Louie Bun, MD  Attending Physician, Emergency [place]    This Emergency Department patient encounter note was created using voice-recognition software. Please  excuse any typographical errors that have not yet been reviewed and corrected.

## 2018-06-28 NOTE — Discharge Instructions (Addendum)
DIAGNOSIS & TREATMENT:  You were seen in a [facility] Emergency Department for abdominal pain.     We think this is due to your gastroesophageal reflux disease which is an ongoing issue for you.  Please follow-up with your gastroenterologist as scheduled    You were treated with IV fluids, antinausea and anti-pain medication     TEST RESULTS:  Your laboratory tests were unremarkable  The CT scan of your abdomen and pelvis showed did not show an     NEW MEDICATIONS:  Please use over the counter Tylenol (acetaminophen) and Motrin (ibuprofen) for pain control.   Take  Tylenol (acetaminophen) 650 MG ( 2 regular strength tablets) every 6 hours.   Take Motrin/Advil (ibuprofen)  600 MG (3 tablets) every 6 hours, and take with food.   You can them together every 6 hours, or alternate between Tylenol and Motrin taking one every three hours. Wait 6 hours between the SAME kind of medication.        Please make sure you monitor your Tylenol use. Tylenol is the same as acetaminophen. For adults, any amount over 4 grams (4,000 mg) in any 24 hour period can cause liver damage, possibly irreversible.   Please make sure to check all the medications you are taking including over-the counter (OTC) medications. Many multi-symptom medications have acetaminophen in them so please check carefully.    For an adult, do not take more than 3200 mg of Ibuprofen in any 24 hour period.     Please contact your doctor or pharmacist for any additional questions.       WHEN SHOULD YOU BE SEEN NEXT?   Please call your doctor and be seen with in the next 3-5 days for re-evaluation if your symptoms are not improving. If you do not have a primary care doctor or would like to transfer your primary care to [facility], please call [phone] to set one up an appointment.    WHEN SHOULD YOU RETURN TO THE ED?  Please contact a care provider or return to the Emergency Department (ED) if you experience new or worsening symptoms,  if you develop nausea and vomiting, if your pain gets worse, if you have a fever, or for any other concerns.        You can always return to the emergency department if you develop severe abdominal pain, intractable nausea vomiting and unable to tolerate eating and drink

## 2018-06-30 ENCOUNTER — Telehealth (HOSPITAL_BASED_OUTPATIENT_CLINIC_OR_DEPARTMENT_OTHER): Payer: Self-pay | Admitting: Internal Medicine

## 2018-06-30 ENCOUNTER — Telehealth (HOSPITAL_BASED_OUTPATIENT_CLINIC_OR_DEPARTMENT_OTHER): Payer: Self-pay

## 2018-06-30 DIAGNOSIS — K2970 Gastritis, unspecified, without bleeding: Secondary | ICD-10-CM

## 2018-06-30 DIAGNOSIS — K219 Gastro-esophageal reflux disease without esophagitis: Secondary | ICD-10-CM

## 2018-06-30 MED ORDER — ALUMINUM & MAGNESIUM HYDROXIDE 200-200 MG/5ML PO SUSP: 30 mL | mL | Freq: Four times a day (QID) | ORAL | 1 refills | 0 days | Status: DC | PRN

## 2018-06-30 MED ORDER — ALUMINUM & MAGNESIUM HYDROXIDE 200-200 MG/5ML PO SUSP
30.0000 mL | Freq: Four times a day (QID) | ORAL | 1 refills | Status: DC | PRN
Start: 2018-06-30 — End: 2018-07-12

## 2018-06-30 NOTE — Progress Notes (Signed)
PLEASE NOTE: Patient called asking for refills for Maalox but when I checked patient's chart I couldn't find that medication in her list. She is stating that she was told to take lidocaine without the Maalox but she said that she needs it. So she is asking for someone to give her a call as soon as possible. Patient's call back number is: [phone]. Thank you

## 2018-06-30 NOTE — Progress Notes (Signed)
Call placed to pt. Left message to call clinic back              Samantha Cameron I. Hartis  Female, 62 year old, [birth year]  MRN:   [id number]  Phone:   [phone] (H) ...  PCP:   Suann Larry. [name], MD  Primary Cvg:   MEDICARE/MEDICARE PART A & B  Next Appt  With Psychiatry  07/01/2018 at 12:30 PM  Dr. Owens Shark patient   Received: Today   Message Contents   Julio Alm Nurses [facility]     [name] [id number], 62 year old, female, Telephone Information:   Home Phone   [phone]   Work Phone   [phone]   Mobile     [phone]       Patient's Preferred Pharmacy:     [facility] - [address], [place]   Phone: [phone] Fax: [phone]       CONFIRMED TODAY: [name] NUMBER: [phone]   Best time to call back: anytime   Cell phone:   Other phone:     Available times:     Patient's language of care: English     Patient does not need an interpreter.     Patient's PCP: Driscilla Grammes, MD     Person calling on behalf of patient: Patient (self)     Calls today to speak to provider only.     Pt would like to speak with Dr. Owens Shark. Pt states she is feeling a burning sensation coming up her chest and also experiencing pain. Pt is concerned. Pt has been in the ER several times due to this problem.

## 2018-06-30 NOTE — Progress Notes (Signed)
Rn spoke to patient who calls requesting an rx for maalox be sent to pharmacy, states she needs this for abdominal issues, GERD- mixes Maalox with lidocaine solution. Upcoming appointment with Dr. Owens Shark on 07/11/18.    Dayton Martes, RN

## 2018-06-30 NOTE — Addendum Note (Signed)
Addended by: Driscilla Grammes. on: 06/30/2018 02:45 PM     Modules accepted: Orders

## 2018-06-30 NOTE — Progress Notes (Signed)
Samantha Cameron  Female, 62 year old, 09-28-1956  MRN:   [id number]  Phone:   [phone] (H) ...  PCP:   Suann Larry. [name], MD  Primary Cvg:   MEDICARE/MEDICARE PART A & B  Next Appt  With Psychiatry  07/01/2018 at 12:30 PM  Dr Owens Shark   Received: Today   Message Contents   [name] Nurses [facility]   Phone Number: [phone]            [place] [id number], 62 year old, female, Telephone Information:   Home Phone   [phone]   Work Phone   [phone]   Mobile     [phone]       Patient's Preferred Pharmacy:     [facility] - [address], [place]   Phone: [phone] Fax: [phone]       CONFIRMED TODAY: [name] NUMBER: [phone]   Best time to call back: any   Cell phone:   Other phone:     Available times:     Patient's language of care: English     Patient does not need an interpreter.     Patient's PCP: Driscilla Grammes, MD     Person calling on behalf of patient: Patient (self)     Calls today to speak to nurse only.   to speak to provider only.     Pt is calling because she has been feeling worse, doesn't want to go to ER because they don't help her nor treat her nice. She can't wait to be seen next week without medication, she feels like her stomach is burning, this also makes her feel very anxious which doesn't her with her condition. Please call back, thank you.      Call placed to pt. As mentioned above she refuses to go to the ED although her records show that she is there at least on a weekly basis.  Pt. Very anxious and it takes saying the same things at least 2-3 times before she actually hears you.

## 2018-07-01 ENCOUNTER — Ambulatory Visit (HOSPITAL_BASED_OUTPATIENT_CLINIC_OR_DEPARTMENT_OTHER): Payer: Medicare Other

## 2018-07-01 ENCOUNTER — Ambulatory Visit: Payer: Medicare Other | Attending: Psychiatry

## 2018-07-01 DIAGNOSIS — F410 Panic disorder [episodic paroxysmal anxiety] without agoraphobia: Secondary | ICD-10-CM | POA: Diagnosis not present

## 2018-07-01 DIAGNOSIS — Z23 Encounter for immunization: Secondary | ICD-10-CM | POA: Diagnosis not present

## 2018-07-02 DIAGNOSIS — M47817 Spondylosis without myelopathy or radiculopathy, lumbosacral region: Secondary | ICD-10-CM | POA: Diagnosis not present

## 2018-07-02 DIAGNOSIS — M5417 Radiculopathy, lumbosacral region: Secondary | ICD-10-CM | POA: Diagnosis not present

## 2018-07-02 NOTE — Progress Notes (Signed)
ADULT PSYCHIATRY INITIAL EVALUATION    CHIEF COMPLAINT: "I feel like people don't really take my issues seriously. I haven't slept at all for three days, going on four."    HISTORY of PRESENT ILLNESS: Pt is a 62yo divorced woman, from [place] descent, appearing in treatment with anxiety, insomnia, and panic symptoms, in the context of worsening acid reflux/ GERD. Pt states, "I'm here because my doctor told me I should come." She describes that she has had many emergency room visits due to her experiencing acid reflux, which she says makes her feel that she can't breathe/ take a breath, and that she panics. Pt complains that her doctor will not prescribe Valium for sleep and anxiety, feeling that maybe he doesn't really care about her concerns. Of note, five years ago pt's father found her son on the floor, having apparently OD'd on heroin/ fentanyl. Pt did not know that her son was abusing heroin, though she admits to seeing "signs." Describes that one year later her father passed, and shortly after her brother did as well. States that she has been having trouble sleeping since these losses.     CURRENT MEDICATIONS: No current psychiatric medications prescribed    Past Medications: Took Effexor in the past, which she found helpful.    CURRENT TREATMENT: None.    System Involvement: Currently foster mother of her granddaughter, who was placed in her custody by DCF.    PAST PSYCHIATRIC HISTORY: None reported    SUBSTANCE USE: Denies history of any drug use. Describes that she used to drink EtOH once every few weeks, but no longer.    Family Constellation: Grew up in intact family, with brother who was 67 years older and sister who was 19 years older. Brother and parents now deceased. Has one daughter and one surviving son, with her youngest son deceased due to OD five years ago. Has five grandchildren, with the eldest living with her. Daughter's daughter is 40yo and has her own child (pt's great granddaughter). 6  and 7yo grandchildren from deceased son living in another foster home, and she is able to visit them as often as she would like (hasn't recently as frequently due to health issues).      Biological Family History: Further assessment indicated.    CURRENT LIVING SITUATION/CURRENT SUPPORTS: Rents a condo where she lives with her 52yo granddaughter.     Social History: Pt grew up in [place]. Parents had good relationship, but were strict to the pt, traditional. Grew up [place], but no longer attends church. Pt states she didn't finish highschool, stating she just decided to leave school ("no reason, just decided to stop going."). Describes that she has worked several different jobs throughout the years, but no longer. Got married when 62yo with first husband, had daughter at age 23. Married five years before divorcing due to him being unfaithful.     Trauma History: Finding son on the floor of her home. "I guess it was from drugs maybe." Denies knowledge of him using drugs prior to this though admits there were many signs.     MEDICAL HISTORY: Spinal stenosis, diabetes II, GERD    MENTAL STATUS EXAM:  Appearance: Appearing stated age, well groomed  Behavior: WNL  Alertness:  WNL  Speech:  WNL  Mood: "I'm tired of all of this"  Affect:  Frustrated, congruent  Thought Process: WNL  Thought Content:  Past history, medical problems  Perceptions:  None  Judgment/Impulse Control: Good    Insight:  Fair   Cognition: Good  Suicidal/Homicidal: None/ Denies    BIO/PSYCHO/SOCIAL AND RISK FORMULATION(S):  Pt is a 62yo divorced woman, current guardian of 40yo granddaughter, of [place] descent, appearing in treatment with medical complaints and complaints of insomnia, describing that she was told by her doctor that she should see a therapist. Pt describes that she experiences acid reflux, which causes her panic due to her feeling that she can't breathe. This has precipitated her going to the ED on numerous occasions. Pt  reports feeling that people in the ED and her doctor are not taking her concerns seriously, and she appears to not have much insight around how anxiety symptoms may be contributing to her ED visits. Pt also has a history of the traumatic loss of her son due to OD. Pt may benefit from therapy to learn methods of managing anxiety and panic symptoms, to reduce ED visits and insomnia.  Ethnic and cultural factors: Grew up strict [place] family  Religious and spiritual beliefs, values and preferences: Grew [employer], still religious but does not attend church regularly.    DSM 5 DIAGNOSES:  Primary Psychiatric Diagnosis: Panic disorder  Secondary Psychiatric Diagnosis:  Deferred  Other Medical Conditions:  Diabetes II, GERD, Spinal stenosis  Psychosocial and Contextual Factors:  Loss of primary attachment (son), chronic pain, medical issues      RISK ASSESSMENT (per scale):  Suicide: Low (1)  Violence: Low (1)  Addiction: Low (1)  Protective Factors: Housing, financially stable    PLAN: Therapy as needed to manage panic symptoms, anxiety, and encourage good sleep    INFORMED CONSENT (for any new medication):  Patient was informed of the potential risks and benefits of the treatment, including the option not to treat, and appeared to understand and agreed to comply. Discussion included the following key points: Confidentiality, Frame of treatment.    Isobelle Tuckett, LICSW  .

## 2018-07-03 ENCOUNTER — Telehealth (HOSPITAL_BASED_OUTPATIENT_CLINIC_OR_DEPARTMENT_OTHER): Payer: Self-pay

## 2018-07-03 NOTE — Addendum Note (Signed)
Addended by: Luella Cook on: 07/03/2018 05:20 PM     Modules accepted: Orders

## 2018-07-04 ENCOUNTER — Other Ambulatory Visit (HOSPITAL_BASED_OUTPATIENT_CLINIC_OR_DEPARTMENT_OTHER): Payer: Self-pay | Admitting: Social Worker

## 2018-07-04 NOTE — Progress Notes (Signed)
Outreach late, error.     Called Pt on 8/15 to check in.  Noted that [facility] has attempted to engage.  Left a message, asked about frequent ED visits.  Encouraged a call to the clinic if Pt is having pain.  Encouraged a call back to TW to discuss services.     [facility]  Phone: [phone]  [email or web address]

## 2018-07-04 NOTE — Progress Notes (Signed)
A message was left for the patient, asking he/she to call us back at [phone] to schedule an appointment.    ([facility])

## 2018-07-06 ENCOUNTER — Emergency Department
Admission: EM | Admit: 2018-07-06 | Discharge: 2018-07-07 | Disposition: A | Discharge status: Home / Self Care | Payer: Medicare Other | Source: Intra-hospital | Attending: Emergency Medicine | Admitting: Emergency Medicine

## 2018-07-06 ENCOUNTER — Encounter (HOSPITAL_BASED_OUTPATIENT_CLINIC_OR_DEPARTMENT_OTHER): Payer: Self-pay

## 2018-07-06 DIAGNOSIS — R079 Chest pain, unspecified: Secondary | ICD-10-CM | POA: Diagnosis not present

## 2018-07-06 DIAGNOSIS — E669 Obesity, unspecified: Secondary | ICD-10-CM | POA: Diagnosis not present

## 2018-07-06 DIAGNOSIS — K2950 Unspecified chronic gastritis without bleeding: Secondary | ICD-10-CM | POA: Diagnosis present

## 2018-07-06 DIAGNOSIS — J449 Chronic obstructive pulmonary disease, unspecified: Secondary | ICD-10-CM | POA: Diagnosis present

## 2018-07-06 DIAGNOSIS — Z882 Allergy status to sulfonamides status: Secondary | ICD-10-CM | POA: Diagnosis not present

## 2018-07-06 DIAGNOSIS — Z87891 Personal history of nicotine dependence: Secondary | ICD-10-CM | POA: Diagnosis not present

## 2018-07-06 DIAGNOSIS — Z885 Allergy status to narcotic agent status: Secondary | ICD-10-CM

## 2018-07-06 DIAGNOSIS — R1013 Epigastric pain: Secondary | ICD-10-CM | POA: Diagnosis not present

## 2018-07-06 DIAGNOSIS — E119 Type 2 diabetes mellitus without complications: Secondary | ICD-10-CM | POA: Diagnosis present

## 2018-07-06 DIAGNOSIS — E785 Hyperlipidemia, unspecified: Secondary | ICD-10-CM | POA: Diagnosis not present

## 2018-07-06 DIAGNOSIS — Z881 Allergy status to other antibiotic agents status: Secondary | ICD-10-CM | POA: Diagnosis not present

## 2018-07-06 DIAGNOSIS — K209 Esophagitis, unspecified without bleeding: Secondary | ICD-10-CM

## 2018-07-06 DIAGNOSIS — I10 Essential (primary) hypertension: Secondary | ICD-10-CM | POA: Diagnosis not present

## 2018-07-06 LAB — BASIC METABOLIC PANEL
ANION GAP: 10 mmol/L (ref 5–15)
BUN (UREA NITROGEN): 14 mg/dL (ref 7–18)
CALCIUM: 9.1 mg/dL (ref 8.5–10.1)
CARBON DIOXIDE: 27 mmol/L (ref 21–32)
CHLORIDE: 105 mmol/L (ref 98–107)
CREATININE: 0.8 mg/dL (ref 0.4–1.2)
ESTIMATED GLOMERULAR FILT RATE: >60 mL/min (ref >60)
Glucose Random: 120 mg/dL (ref 74–160)
POTASSIUM: 4.1 mmol/L (ref 3.5–5.1)
SODIUM: 142 mmol/L (ref 136–145)

## 2018-07-06 LAB — LIPASE: LIPASE: 144 U/L (ref 73–393)

## 2018-07-06 LAB — CBC, PLATELET & DIFFERENTIAL
ABSOLUTE BASO COUNT: 0 10*3/uL (ref 0.0–0.1)
ABSOLUTE EOSINOPHIL COUNT: 0.3 10*3/uL (ref 0.0–0.8)
ABSOLUTE IMM GRAN COUNT: 0 10*3/uL (ref 0.00–0.03)
ABSOLUTE LYMPH COUNT: 2.2 10*3/uL (ref 0.6–5.9)
ABSOLUTE MONO COUNT: 0.8 10*3/uL (ref 0.2–1.4)
ABSOLUTE NEUTROPHIL COUNT: 4.7 10*3/uL (ref 1.6–8.3)
BASOPHIL %: 0.1 % (ref 0.0–1.2)
EOSINOPHIL %: 3.2 % (ref 0.0–7.0)
HEMATOCRIT: 37.8 % (ref 34.1–44.9)
HEMOGLOBIN: 11.8 g/dL (ref 11.2–15.7)
IMMATURE GRANULOCYTE %: 0 % (ref 0.0–0.4)
LYMPHOCYTE %: 27.4 % (ref 15.0–54.0)
MEAN CORP HGB CONC: 31.2 g/dL (ref 31.0–37.0)
MEAN CORPUSCULAR HGB: 28.4 pg (ref 26.0–34.0)
MEAN CORPUSCULAR VOL: 91.1 fL (ref 80.0–100.0)
MEAN PLATELET VOLUME: 11 fL (ref 8.7–12.5)
MONOCYTE %: 9.8 % (ref 4.0–13.0)
NEUTROPHIL %: 59.5 % (ref 40.0–75.0)
PLATELET COUNT: 243 10*3/uL (ref 150–400)
RBC DISTRIBUTION WIDTH STD DEV: 45 fL (ref 35.1–46.3)
RBC DISTRIBUTION WIDTH: 13.9 % (ref 11.5–14.3)
RED BLOOD CELL COUNT: 4.15 M/uL (ref 3.90–5.20)
WHITE BLOOD CELL COUNT: 7.9 10*3/uL (ref 4.0–11.0)

## 2018-07-06 LAB — HEPATIC FUNCTION PANEL
ALANINE AMINOTRANSFERASE: 35 U/L (ref 12–45)
ALBUMIN: 3.7 g/dL (ref 3.4–5.0)
ALKALINE PHOSPHATASE: 90 U/L (ref 45–117)
ASPARTATE AMINOTRANSFERASE: 25 U/L (ref 8–34)
BILIRUBIN DIRECT: 0.1 mg/dl (ref 0.0–0.2)
BILIRUBIN TOTAL: 0.2 mg/dL (ref 0.2–1.0)
INDIRECT BILIRUBIN: 0.1 mg/dL — ABNORMAL LOW (ref 0.2–0.9)
TOTAL PROTEIN: 6.9 g/dL (ref 6.4–8.2)

## 2018-07-06 LAB — TROPONIN I: TROPONIN I: <0.02 ng/mL (ref 0.00–0.04)

## 2018-07-06 LAB — MAGNESIUM: MAGNESIUM: 1.8 mg/dL (ref 1.8–2.4)

## 2018-07-06 MED ORDER — LIDOCAINE VISCOUS 2 % MT SOLN
10.0000 mL | Freq: Once | OROMUCOSAL | Status: AC
Start: 2018-07-06 — End: 2018-07-06
  Administered 2018-07-06: 10 mL via OROMUCOSAL
  Filled 2018-07-06: qty 15

## 2018-07-06 MED ORDER — SODIUM CHLORIDE 0.9 % IV BOLUS
1000.0000 mL | Freq: Once | INTRAVENOUS | Status: AC
Start: 2018-07-06 — End: 2018-07-06
  Administered 2018-07-06: 1000 mL via INTRAVENOUS

## 2018-07-06 MED ORDER — ACETAMINOPHEN 500 MG PO TABS
1000.0000 mg | ORAL_TABLET | Freq: Once | ORAL | Status: AC
Start: 2018-07-06 — End: 2018-07-06
  Administered 2018-07-06: 1000 mg via ORAL
  Filled 2018-07-06: qty 2

## 2018-07-06 MED ORDER — ALUMINUM & MAGNESIUM HYDROXIDE 200-200 MG/5ML PO SUSP
30.0000 mL | Freq: Once | ORAL | Status: AC
Start: 2018-07-06 — End: 2018-07-06
  Administered 2018-07-06: 30 mL via ORAL
  Filled 2018-07-06: qty 30

## 2018-07-06 MED ORDER — SUCRALFATE 1 GM/10ML PO SUSP
1.00 g | Freq: Once | ORAL | Status: AC
Start: 2018-07-06 — End: 2018-07-06
  Administered 2018-07-06: 1 g via ORAL
  Filled 2018-07-06: qty 10

## 2018-07-06 MED ORDER — FAMOTIDINE 20 MG/2ML IV SOLN
20.0000 mg | Freq: Once | INTRAVENOUS | Status: AC
Start: 2018-07-06 — End: 2018-07-06
  Administered 2018-07-06: 20 mg via INTRAVENOUS
  Filled 2018-07-06: qty 2

## 2018-07-06 NOTE — ED Triage Note (Signed)
Pt states she was up all night d/t burning pain in her chest into her throat.  Pt has been taking GI cocktails without assistance.  Pt also describes having spinal stenosis where she cannot feel her legs and today she fell directly onto her stomach and now when is experience abdominal pain.

## 2018-07-06 NOTE — ED Provider Notes (Signed)
ED nursing record was reviewed. Prior medical records as available through Epic were reviewed.    This patient was seen with Emergency Department attending physician Dr. Rulon Abide    HPI:    Samantha Cameron is a 62 year old female with past medical history significant for esophagitis, gastritis without bleeding, GERD, COPD, type 2 diabetes who presents at 42.    Patient presents for evaluation of upper abdominal pain. This has been an ongoing problem for her for the past 4 months. Reports having severe epigastric burning radiating into her throat starting this morning following a night of not sleeping. Constant, persists despite use of omeprazole, GI cocktail. She also reports having had a fall today, she has a history of mechanical falls in the setting of intermittent leg numbness due to spinal stenosis. States she fell flat on the floor onto her abdomen. Denies LOC, head strike, additional injury. Denies current limb numbness or weakness, saddle anesthesia, incontinence, urinary retention. Otherwise denies pre/syncope, chest pain, shortness of breath, vomiting, bowel or bladder complaints. GI appointment scheduled w Dr Owens Shark this coming Friday.    ROS: Pertinent positives were reviewed as per the HPI above. All other systems were reviewed and are negative.  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 right tricompartment arthritis esp medial                 femoral tibial  No date: Cerumen impaction      Comment:  per steward records  02/01/2012: Chest pain  No date: Chronic bronchitis  02/01/2012: Chronic bronchitis with COPD (chronic obstructive   pulmonary disease) ([place])  09/14/2015: Cough  No date: Depression      Comment:  per steward records  No date: Diabetes  No date: Disorders of lipoid metabolism  No date: Esophageal reflux  No date: External hemorrhoid      Comment:  per steward records  No date: HTN (hypertension)  02/01/2012: Hypoxia  02/16/2017: Left ear pain  06/07/2015: Left genital labial abscess  06/16/2015: Obesity, Class III, 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y) ([place])  08/16/2015: Osteopenia      Comment:  On xray of left knee per steward records 06/2014 Unclear                if addressed  05/23/2018: Pancreatic lesion  09/14/2015: Right ear impacted cerumen  08/15/2015: Type 2 diabetes mellitus without complication, without   long-term current use of insulin (HCC)      Comment:   HEMOGLOBIN A1C (%) Date Value 07/15/2015 6.2 (H)                ----------  Steward record - a1c 6.2 on 01/31/15 6.4 on  09/30/14 5.9 on 12/29/13 6.4 08/18/13   11/18/2017: Viral URI with cough  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) ([place])     Tobacco use disorder     Spinal stenosis of lumbar region     HLD (hyperlipidemia)     Chronic bilateral low back pain with bilateral sciatica     Skin change     Pineal gland cyst     History of vitamin D deficiency     Hemorrhoids     Venous (peripheral) insufficiency     Adrenal adenoma     Type 2 diabetes mellitus with microalbuminuria, without long-term current use of insulin (HCC)     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 (HCC)     Acute pain of right wrist     Abnormal PFTs     Screening for malignant neoplasm of breast     Skin irritation     H/O bone density study     Vertigo     Orthostatic hypotension     Social problem     Right arm  weakness     Insomnia     Acute pain of left knee     Hx of total knee replacement, left     Acute bacterial sinusitis     Seasonal allergic rhinitis     Left foot pain     Stress due to family tension     Gastritis without bleeding     Leucocytosis     Gastroesophageal reflux disease without esophagitis      HOSP-TO-HOME COMMUNITY COLLABORATION PROGRAM     Epigastric pain     Hx of gastritis     Common bile duct dilation     Fatty liver     Pancreatic lesion     Vascular calcification     Umbilical hernia without obstruction and without gangrene     Arthritis of spine     Scoliosis of thoracic spine     Anxiety     Sleep disturbance     Esophagitis     First degree AV block    Past Surgical History: Past Surgical History:  No date: ANES NERVE MUSC TENDON FASCIA&BURSA KNEE&/POPLT  No date: FOOT SURGERY      Comment:  bilateral  No date: LAPAROSCOPY SURG CHOLECYSTECTOMY  No date: OB ANTEPARTUM CARE CESAREAN DLVR & POSTPARTUM      Comment:  x3  No date: TONSILLECTOMY & ADENOIDECTOMY <AGE 41  04/13/2009: TOTAL KNEE REPLACEMENT      Comment:  left  08/11/2009: WRIST GANGLION EXCISION      Comment:  left   Social History:   Social History     Socioeconomic History    Marital status: Divorced     Spouse name: Not on file    Number of children: Not on file    Years of education: Not on file    Highest education level: Not on file   Occupational History    Not on file   Social Needs    Financial resource strain: Not on file    Food insecurity:     Worry: Not on file     Inability: Not on file    Transportation needs:     Medical: Not on file     Non-medical: Not on file   Tobacco Use    Smoking status: Former Smoker     Packs/day: 0.50  Years: 31.00     Pack years: 15.50     Types: Cigarettes     Last attempt to quit: 03/10/2018     Years since quitting: 0.3    Smokeless tobacco: Never Used    Tobacco comment: quit smoking inform provided to patient   Substance and Sexual Activity    Alcohol use: No    Drug  use: No    Sexual activity: Not Currently     Partners: Male     Birth control/protection: Tubal Ligation   Lifestyle    Physical activity:     Days per week: Not on file     Minutes per session: Not on file    Stress: Not on file   Relationships    Social connections:     Talks on phone: Not on file     Gets together: Not on file     Attends religious service: Not on file     Active member of club or organization: Not on file     Attends meetings of clubs or organizations: Not on file     Relationship status: Not on file    Intimate partner violence:     Fear of current or ex partner: Not on file     Emotionally abused: Not on file     Physically abused: Not on file     Forced sexual activity: Not on file   Other Topics Concern    Not on file   Social History Narrative    Lives with son    1 daughter and another son passed away    Disabled from back    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 Simons,MD, 07/15/2015, 2:56 PM           Allergies: Review of Patient's Allergies indicates:   Codeine camsylate       Shortness of Breath, Other (See                            Comments)    Comment:Chest pain   Motrin [ibuprofen]      Nausea Only   Sulfa antibiotics       Other (See Comments)   Bactrim                 Rash, Itching  Immunizations:   Immunization History   Administered Date(s) Administered    HEP B ADULT 3 DOSE 20 and > 08/15/2015, 09/14/2015, 05/17/2016    INFLUENZA VIRUS TRI W/PRESV VACCINE 18/> YRS IM (PRIVATE) 10/14/2007, 09/17/2008, 09/01/2009, 08/07/2010, 08/14/2011, 09/03/2011, 09/12/2012, 08/18/2013, 08/24/2014, 06/30/2015    Influenza Virus Quad Presv Free Vacc 3/> Yrs IM 06/30/2015, 12/11/2016, 07/02/2017    Influenza Virus Quadrivalent Vacc 3/> Yrs Im 06/30/2015, 07/02/2017,  07/01/2018    Influenza Virus Tri Presv Free 3/> YRS IM 07/03/2016    PNEUMOCOCCAL POLYSACCHARIDE VACCINE v23 03/12/2014, 08/15/2015, 12/01/2015    Pneumococcal Vaccine, Conjugate V7 01/31/2009    Td 11/01/2001    Tdap 09/17/2008, 12/11/2016    ZOSTER SHINGLES VACC, LIVE SC 08/24/2008, 12/01/2015    Zoster Vacc (HZV) Recomb Adjv, IM, 2 Dose, (SHINGRIX) 05/30/2017,  07/17/2017          Medications:  Prior to Admission Medications   Prescriptions Last Dose Informant Patient Reported? Taking?   ONETOUCH DELICA LANCETS FINE MISC   No No   Sig: TEST once daily   ONETOUCH VERIO test strip   No No   Sig: TEST once daily   Omega-3 Fatty Acids (FISH OIL) 1000 MG CAPS   Yes No   Sig: Take by mouth   albuterol (PROVENTIL HFA,VENTOLIN HFA, PROAIR HFA) 108 (90 Base) MCG/ACT inhaler   No No   Sig: Inhale 2 puffs into the lungs every 6 (six) hours as needed for Wheezing or Shortness of breath   albuterol (PROVENTIL) (2.5 MG/3ML) 0.083% nebulizer solution   No No   Sig: Take 3 mLs by nebulization every 6 (six) hours as needed for Wheezing   aluminum-magnesium hydroxide (MAALOX) 200-200 mg/5 mL suspension   No No   Sig: Take 30 mLs by mouth every 6 (six) hours as needed for Indigestion   aspirin 81 MG tablet   Yes No   Sig: Take 81 mg by mouth daily.   atorvastatin (LIPITOR) 20 MG tablet   No No   Sig: take 1 tablet by mouth once daily   cholecalciferol (VITAMIN D3) 1000 UNIT tablet   Yes No   Sig: Take 1 tablet by mouth daily   fluticasone (FLONASE) 50 MCG/ACT nasal spray   No No   Sig: 1 spray by Each Nostril route daily   lidocaine (XYLOCAINE) 2 % solution   No No   Sig: Take 10 mLs by mouth as needed for Pain Use as directed for abd pain   lisinopril (PRINIVIL,ZESTRIL) 20 MG tablet   No No   Sig: take 1 tablet by mouth once daily   metFORMIN (GLUCOPHAGE-XR) 500 MG 24 hr tablet   No No   Sig: take 4 tablets by mouth once daily WITH BREAKFAST   metoclopramide (REGLAN) 10 MG tablet   No No   Sig: Take 1 tablet by mouth every  6 (six) hours as needed (nausea)   metoprolol (TOPROL-XL) 25 MG 24 hr tablet   No No   Sig: Take 1 tablet by mouth daily   omeprazole (PRILOSEC) 40 MG capsule   No No   Sig: Take 1 capsule by mouth 2 (two) times daily take 1 capsule by mouth twice a day   oxycodone-acetaminophen (PERCOCET) 5-325 MG per tablet   Yes No   Sig: Take 1 tablet by mouth 2 (two) times daily as needed With Dr Dwyane Dee   pregabalin (LYRICA) 100 MG capsule   Yes No   Sig: Take 100 mg by mouth 2 (two) times daily With Dr Dwyane Dee   ranitidine (ZANTAC) 75 MG tablet   No No   Sig: Take 1 tablet PO at bedtime   sucralfate (CARAFATE) 1 GM/10ML suspension   No No   Sig: Take 10 mLs by mouth 4 (four) times daily      Facility-Administered Medications: None     Physical Exam:  Patient Vitals for the past 72 hrs:   Temp Pulse Resp BP SpO2   07/06/18 1742 98.1 F 86 18 137/75 97 %   07/06/18 1946 -- 76 16 140/75 98 %     GENERAL:  Appears uncomfortable, WN, no acute distress, non-toxic   SKIN:  Warm & dry, no rash  HEENT:  NCAT; EOMI. Sclerae are anicteric and aninjected  LUNGS:  Clear to auscultation bilaterally. No  wheezes, rales, rhonchi  HEART:  RRR.  No murmurs, rubs, or gallops  ABDOMEN:  Soft, ND. Tender epigastric region.  No masses.  No involuntary guarding or rebound   MSK/EXTREMITIES:  No obvious deformities.  Warm and well perfused.  No cyanosis, no edema  GENITOURINARY:  No CVA tenderness  NEUROLOGIC:  Alert, oriented; moves all extremities; speaking in sentences. Normal gait without ataxia  PSYCHIATRIC:  Appropriate for age, time of day, and situation    Medications Given in the ED:  Medications   sodium chloride 0.9 % IV bolus 1,000 mL (1,000 mLs Intravenous New Bag 07/06/18 1824)   famotidine (PEPCID) injection 20 mg (20 mg Intravenous Given 07/06/18 1824)   lidocaine (XYLOCAINE) 2 % viscous solution 10 mL (10 mLs Mouth/Throat Given 07/06/18 1824)   aluminum-magnesium hydroxide (MAALOX) 200-200 mg/5 mL suspension 30 mL (30 mLs Oral Given 07/06/18  1824)   sucralfate (CARAFATE) 1 GM/10ML suspension 1 g (1 g Oral Given 07/06/18 1824)   acetaminophen (TYLENOL) tablet 1,000 mg (1,000 mg Oral Given 07/06/18 1847)    Radiology Results:  n/a   Lab Results (abnormal results only):  Labs Reviewed   HEPATIC FUNCTION PANEL - Abnormal; Notable for the following components:       Result Value    INDIRECT BILIRUBIN 0.1 (*)     All other components within normal limits   CBC, PLATELET & DIFFERENTIAL   BASIC METABOLIC PANEL   MAGNESIUM   LIPASE   TROPONIN I    Other Results/Old Record review (e.g. ECG):  EKG reveals a ventricular rate of 72, NSR. PR 174 QRS 92 QTc 422. Normal axis, normal intervals, no heart strain and no acute ST or T wave changes indicative of ACS     ED Course and Medical Decision-making:  Patient's past medical history/problem list, past surgical history, medication list, social history and allergies reviewed. Pertinent labs & imaging studies reviewed.  Prior records reviewed. Nursing notes reviewed.    Samantha Cameron is a 62 year old female who presented at 1737 for evaluation of epigastric pain.    Patient appears uncomfortable, though in no acute distress on presentation.  Afebrile, hemodynamically stable. Physical exam as above.    Cardiac etiology of her pain considered, EKG NSR, troponin negative.    Blood work including CBC, BMP, magnesium, lipase, LFTs notable only for an indirect bilirubin of 0.1. Less concern for cholecystitis/hepatitis/pancreatitis currently. She continues to have regular bowel movements, less concern for obstruction.    Review of records shows patient had an EGD performed 06/23/18, revealing reflux esophagitis, chronic gastritis. This is likely the cause of her symptoms. Though she did have a fall in which she hit her abdomen on the floor, low suspicion for internal bleeding based on current exam. She reports her intermittent leg numbness is typical for her due to underlying spinal stenosis, less concern for cauda equina, cord  compression currently.    Stable for discharge with GI follow up.    Patient/parent/guardian has been provided with precautions to return to the ED for new or worsening symptoms, or if unable to obtain follow up care.     Condition on Discharge: Improved and stable    Diagnosis/Diagnoses:   Esophagitis  Chronic gastritis without bleeding, unspecified gastritis type    Dianna Limbo, PA-C, 07/07/2018 12:10 PM  This Emergency Department patient encounter note was created using voice-recognition software and in real time during the ED visit.

## 2018-07-06 NOTE — Narrator Note (Signed)
PA at bedside.

## 2018-07-06 NOTE — ED Provider Notes (Signed)
I performed a history and physical examination of Samantha Cameron and discussed management with the PA.     HPI:  Briefly, this is a 62 year old female who presents for evaluation of upper abdominal pain. She has a history of esophagitis, confirmed on recent EGD, for which she is on medical therapy. She has been having this pain for weeks. Today's pain is similar but feels somewhat worse since she tripped at home and landed flat on her abdomen on the floor. No difficulty breathing or chest pain.     EXAM:  GENERAL: No acute distress, non-toxic   HEAD: No evidence of trauma.   Normal posterior oropharynx. Moist mucous membranes.   Pupils equal & reactive, extraocular movements intact.   NECK: Supple. No stridor.  LUNGS: Respirations even and unlabored. Clear to auscultation bilaterally. No wheezes, rales, rhonchi. No rib tenderness.  HEART: Regular rate & rhythm.  No murmurs appreciated.    ABDOMEN: Soft, nontender.  No involuntary guarding or rebound tenderness.   MUSCULOSKELETAL: No obvious deformities or trauma.  Warm and well perfused.  No edema.   SKIN: Warm and dry, no rash  NEUROLOGIC: Alert and oriented x3; moves all extremities well; speaking in clear fluent sentences.   PSYCHIATRIC: Appropriate, cooperative.     MDM/Plan:  The patient has esophagitis, and I suspect this is the cause of her symptoms today. I do not think she needs imaging or further investigations at this time. Her presentation is not concerning for traumatic injury. She will follow up with her gastroenterologist.    Diagnosis:  Esophagitis  Chronic gastritis without bleeding, unspecified gastritis type      Philip Aspen, MD

## 2018-07-06 NOTE — Discharge Instructions (Signed)
You or your loved one was seen in a [facility] Emergency Department for abdominal pain. Your evaluation is reassuring that you do not require hospital admission today. This is likely from your esophagitis/gastritis.    Continue to take your medications as prescribed. Keep your appointment with Dr Owens Shark as scheduled.    Call your primary care provider tomorrow to let them know about your emergency department visit today.     If you do not have a primary care doctor or would like to transfer your primary care to [facility], please call [phone] to set up an appointment.    Return to the emergency room for new or worsening symptoms including chest pain, shortness of breath, abdominal pain, or if you are unable to schedule follow up care.

## 2018-07-08 ENCOUNTER — Telehealth (HOSPITAL_BASED_OUTPATIENT_CLINIC_OR_DEPARTMENT_OTHER): Payer: Self-pay | Admitting: Internal Medicine

## 2018-07-08 ENCOUNTER — Other Ambulatory Visit (HOSPITAL_BASED_OUTPATIENT_CLINIC_OR_DEPARTMENT_OTHER): Payer: Self-pay | Admitting: Social Worker

## 2018-07-08 NOTE — Progress Notes (Unsigned)
Pt was called, states that she has only 3 tea spoons of viscous lidocaine left, she mixes 29ml of lidocaine with maalox every 3 to 6 hrs as needed. Plan to discuss with GI

## 2018-07-08 NOTE — Progress Notes (Signed)
Thank you for your referral. We were able to contact your patient. she was given an appointment for 09/16/18 with Delynn Flavin at [facility]. Offered sooner but patient can only do afternoon.

## 2018-07-08 NOTE — Progress Notes (Unsigned)
Patient calling stating that she takes lidocaine solution every 3-6 hours prn. She takes it with the Maalox. She is almost out of medication but her insurance won't pay for the medication until 08/22.     Please send a new script with updated directions (frequency) if appropriate.

## 2018-07-09 ENCOUNTER — Telehealth (HOSPITAL_BASED_OUTPATIENT_CLINIC_OR_DEPARTMENT_OTHER): Payer: Self-pay

## 2018-07-09 ENCOUNTER — Encounter (HOSPITAL_BASED_OUTPATIENT_CLINIC_OR_DEPARTMENT_OTHER): Payer: Self-pay | Admitting: Pulmonary

## 2018-07-09 ENCOUNTER — Other Ambulatory Visit (HOSPITAL_BASED_OUTPATIENT_CLINIC_OR_DEPARTMENT_OTHER): Payer: Self-pay | Admitting: Pulmonary

## 2018-07-09 DIAGNOSIS — G4733 Obstructive sleep apnea (adult) (pediatric): Secondary | ICD-10-CM | POA: Insufficient documentation

## 2018-07-09 NOTE — Progress Notes (Signed)
TW contacted pt per request of Saralyn Pilar Hunnicutt for transfer for psychotherapy. Pt will be scheduled for 8/26 at 2:00 pm for her first session with TW.  Patient asked if psychopharm appointment scheduled for 10/29 could be rescheduled to be earlier. TW gave pt the number to call for that request.

## 2018-07-09 NOTE — Progress Notes (Signed)
Study Findings:      Recent overnight polysomnogram showed moderate sleep apnea.  Recommend CPAP titration.  We have entered an order-sleep lab FYI.  Thank you for the referral      __________________________________________  Please also note:   The complete results of this patient's recent sleep study are now available in the Cardio/Pulm/Neuro tab [facility] based studies) or in the Media section if the study was done through an outside vendor.   The major findings are summarized above.   These results have not been communicated to the patient, and are now available for the ordering physician to go over with the patient and discuss management.    If OSA is diagnosed:   The sleep lab will help facilitate a CPAP titration study if needed, or order CPAP equipment if CPAP has been titrated, to prevent delays in starting therapy. Once insurance approval is obtained for the CPAP study, the sleep lab will call the patient to schedule. Patients may also call the sleep lab at [phone] to schedule the CPAP titration study if desired.    For compliance purposes , patients need to use the CPAP device at least 4 hours a night, 5 nights a week and patient will need to be seen after the 31 st day for a face to face meeting to document the patient is using the CPAP and benefiting from it.         The sleep lab is unable to review the results of the study with every patient unless they are already followed in sleep clinic. For those patients diagnosed with OSA, a sleep clinic appointment will be automatically scheduled after CPAP equipment is set-up, in an effort to ensure necessary documentation for ongoing CPAP therapy is completed.    If additional input is desired earlier in the process, sleep consultation can be obtained at any time through a referral to sleep clinic, and questions about the process can be directed to the Sleep Lab EPIC message pool.  Thank you.

## 2018-07-09 NOTE — Progress Notes (Unsigned)
Waiting for response from GI

## 2018-07-10 LAB — EKG

## 2018-07-11 ENCOUNTER — Telehealth (HOSPITAL_BASED_OUTPATIENT_CLINIC_OR_DEPARTMENT_OTHER): Payer: Self-pay

## 2018-07-11 ENCOUNTER — Ambulatory Visit: Payer: Medicare Other | Attending: Gastroenterology | Admitting: Gastroenterology

## 2018-07-11 VITALS — BP 115/73 | HR 77 | Wt 218.0 lb

## 2018-07-11 DIAGNOSIS — R634 Abnormal weight loss: Secondary | ICD-10-CM | POA: Diagnosis present

## 2018-07-11 DIAGNOSIS — K219 Gastro-esophageal reflux disease without esophagitis: Secondary | ICD-10-CM | POA: Insufficient documentation

## 2018-07-11 DIAGNOSIS — A048 Other specified bacterial intestinal infections: Secondary | ICD-10-CM | POA: Diagnosis present

## 2018-07-11 MED ORDER — RANITIDINE HCL 300 MG PO CAPS: 300 mg | capsule | Freq: Every evening | 0 refills | 0 days | Status: DC

## 2018-07-11 MED ORDER — RANITIDINE HCL 300 MG PO CAPS
300.0000 mg | ORAL_CAPSULE | Freq: Every evening | ORAL | 0 refills | Status: DC
Start: 2018-07-11 — End: 2018-07-23

## 2018-07-11 NOTE — Progress Notes (Signed)
Called daughter and reviewed here instructions concerning GERD.Marland Kitchen    Her mother has an appointment with Dr Trinda Pascal on 9/4 and not sure why since she saw Dr Owens Shark today. Will send a message to both to see who and when this patient needs to be seen.

## 2018-07-11 NOTE — Progress Notes (Signed)
Quick Summary/Overview:    Overview of Issues covered during this visit:    Discussed the results of this patient's diagnostic testing which does not show a clear-cut source for her symptoms of reflux that she has persistent symptoms.  Recommendation and plan is for her undergo a pH study to see if her symptoms are indeed due to acid reflux and if so plan will be for her undergo potential surgical treatment.  I placed her on Zantac 300 mg in addition to Carafate 1 g 3 times a day and Prilosec 20 mg by mouth twice.  As you know the patient has a history of H. Pylori infection and also associated with her symptoms and abnormal loss of weight.      Driscilla Grammes, MD  [place], [place] [address]    Dear Dr. Driscilla Grammes, MD,    I wish to thank you for referring your patient.  ___________________________________  REASON FOR CONSULT:   I am asked to see our patient in clinical consultation for evaluation of a chief complaint of Gastroesophageal reflux disease, esophagitis presence not specified  (primary encounter diagnosis)  H. pylori infection  Abnormal loss of weight          CHIEF COMPLAINT   Evaluation of this chief complaint led to the following visit diagnoses:   Gastroesophageal reflux disease, esophagitis presence not specified  (primary encounter diagnosis)  H. pylori infection  Abnormal loss of weight      HPI :It is with great pleasure that I am seeing our mutual patient in the GI clinic.      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) ([place])     Tobacco use disorder     Spinal stenosis of lumbar region     HLD (hyperlipidemia)     Chronic bilateral low back pain with bilateral sciatica     Skin change     Pineal gland cyst     History of vitamin D deficiency     Hemorrhoids     Venous (peripheral) insufficiency     Adrenal adenoma     Type 2 diabetes mellitus with microalbuminuria, without  long-term current use of insulin (HCC)     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 (HCC)     Acute pain of right wrist     Abnormal PFTs     Screening for malignant neoplasm of breast     Skin irritation     H/O bone density study     Vertigo     Orthostatic hypotension     Social problem     Right arm weakness     Insomnia     Acute pain of left knee     Hx of total knee replacement, left     Acute bacterial sinusitis     Seasonal allergic rhinitis     Left foot pain     Stress due to family tension     Gastritis without bleeding     Leucocytosis     Gastroesophageal reflux disease without esophagitis      HOSP-TO-HOME COMMUNITY COLLABORATION PROGRAM     Epigastric pain     Hx of gastritis     Common bile duct dilation     Fatty liver     Pancreatic lesion     Vascular calcification     Umbilical  hernia without obstruction and without gangrene     Arthritis of spine     Scoliosis of thoracic spine     Anxiety     Sleep disturbance     Esophagitis     First degree AV block     Obstructive sleep apnea        SURGICAL HISTORY  Past Surgical History:  No date: ANES NERVE MUSC TENDON FASCIA&BURSA KNEE&/POPLT  No date: FOOT SURGERY      Comment:  bilateral  No date: LAPAROSCOPY SURG CHOLECYSTECTOMY  No date: OB ANTEPARTUM CARE CESAREAN DLVR & POSTPARTUM      Comment:  x3  No date: TONSILLECTOMY & ADENOIDECTOMY <AGE 45  04/13/2009: TOTAL KNEE REPLACEMENT      Comment:  left  08/11/2009: WRIST GANGLION EXCISION      Comment:  left     CURRENT MEDICATIONS  Current Outpatient Medications   Medication Sig    ranitidine (ZANTAC) 300 MG capsule Take 1 capsule by mouth every evening    aluminum-magnesium hydroxide (MAALOX) 200-200 mg/5 mL suspension Take 30 mLs by mouth every 6 (six) hours as needed for Indigestion    omeprazole (PRILOSEC) 40 MG capsule Take 1 capsule by mouth 2 (two) times daily take 1 capsule by mouth twice a day    sucralfate (CARAFATE) 1 GM/10ML suspension Take 10 mLs by mouth 4 (four) times  daily    fluticasone (FLONASE) 50 MCG/ACT nasal spray 1 spray by Each Nostril route daily    albuterol (PROVENTIL) (2.5 MG/3ML) 0.083% nebulizer solution Take 3 mLs by nebulization every 6 (six) hours as needed for Wheezing    metoprolol (TOPROL-XL) 25 MG 24 hr tablet Take 1 tablet by mouth daily    lisinopril (PRINIVIL,ZESTRIL) 20 MG tablet take 1 tablet by mouth once daily    ONETOUCH DELICA LANCETS FINE MISC TEST once daily    metFORMIN (GLUCOPHAGE-XR) 500 MG 24 hr tablet take 4 tablets by mouth once daily WITH BREAKFAST    atorvastatin (LIPITOR) 20 MG tablet take 1 tablet by mouth once daily    ONETOUCH VERIO test strip TEST once daily    albuterol (PROVENTIL HFA,VENTOLIN HFA, PROAIR HFA) 108 (90 Base) MCG/ACT inhaler Inhale 2 puffs into the lungs every 6 (six) hours as needed for Wheezing or Shortness of breath    Omega-3 Fatty Acids (FISH OIL) 1000 MG CAPS Take by mouth    cholecalciferol (VITAMIN D3) 1000 UNIT tablet Take 1 tablet by mouth daily    pregabalin (LYRICA) 100 MG capsule Take 100 mg by mouth 2 (two) times daily With Dr Dwyane Dee    oxycodone-acetaminophen (PERCOCET) 5-325 MG per tablet Take 1 tablet by mouth 2 (two) times daily as needed With Dr Dwyane Dee    aspirin 81 MG tablet Take 81 mg by mouth daily.     No current facility-administered medications for this visit.        ALLERGIES   Review of Patient's Allergies indicates:   Codeine camsylate       Shortness of Breath, Other (See                            Comments)    Comment:Chest pain   Motrin [ibuprofen]      Nausea Only   Sulfa antibiotics       Other (See Comments)   Bactrim                 Rash,  Itching      FAMILY HISTORY   Review of patient's family history indicates:  Problem: Diabetes      Relation: Mother          Age of Onset: (Not Specified)  Problem: Heart      Relation: Brother          Age of Onset: (Not Specified)          Comment: CAD and AAA  Problem: Hypertension      Relation: Mother          Age of Onset: (Not  Specified)  Problem: Arthritis      Relation: Mother          Age of Onset: (Not Specified)  Problem: Stroke      Relation: Mother          Age of Onset: (Not Specified)  Problem: Diabetes      Relation: Brother          Age of Onset: (Not Specified)          Comment: same brother  Problem: Cancer - Other      Relation: Father          Age of Onset: (Not Specified)          Comment: type uncertain  Problem: Thyroid      Relation: Sister          Age of Onset: (Not Specified)  Problem: Alcohol/Drug Abuse      Relation: Son          Age of Onset: (Not Specified)  Problem: OTHER      Relation: Son          Age of Onset: (Not Specified)          Comment: ?brain aneurysm per steward records  Problem: No Known Problems      Relation: Grandchild          Age of Onset: (Not Specified)          Comment: 6  Problem: Cancer - Breast      Relation: FamHxNeg          Age of Onset: (Not Specified)  Problem: Cancer - Cervical      Relation: FamHxNeg          Age of Onset: (Not Specified)  Problem: Cancer - Ovarian      Relation: FamHxNeg          Age of Onset: (Not Specified)  Problem: Cancer - Colon      Relation: FamHxNeg          Age of Onset: (Not Specified)  Problem: Psychiatric Illness      Relation: FamHxNeg          Age of Onset: (Not Specified)      SOCIAL HISTORY   Social History    Tobacco Use      Smoking status: Former Smoker        Packs/day: 0.50        Years: 31.00        Pack years: 15.5        Types: Cigarettes        Quit date: 03/10/2018        Years since quitting: 0.3      Smokeless tobacco: Never Used      Tobacco comment: quit smoking inform provided to patient    Alcohol use: No    Drug use: No  REVIEW OF SYSTEMS PHYSICAL EXAM   Review of Systems:  All systems were otherwise negative except for the GI and other systems documented in the History of the Present Illness.    __________________________________________________  Physical Exam:  The patient is sitting in the examining table and appears to  be in no apparent distress.  BP 115/73  Pulse 77  Wt 98.9 kg (218 lb)  LMP 10/22/2007 (LMP Unknown)  SpO2 96%  BMI 39.87 kg/m2  HEENT: Normocephalic atraumatic  Oropharynx: Clear with no erythema or exudates  Neck: Supple no palpable lymphadenopathy in the neck or the axilla  Lungs: Clear to ausculation bilaterally  Heart: Regular rate and rhythm,  no murmurs rubs or gallops  XQJ:JHER non tender with positive bowel sounds.  Extremities: No swelling or edema  Skin: No rashes or lesions  Neuro: Alert and oriented to person , place and time  _____________________________________________     ASSESSMENT/  PLAN, Test/Procedures ordered and recommended follow-up  :.:.  Plan is for diagnostic pH study on medications.  If the pH study shows evidence of acid reflux symptoms on medication Up will be for surgical treatment for acid reflux disease.        This clinical note was dictated using voice recognition software. Although the note has been edited please excuse any missed typographic errors.

## 2018-07-11 NOTE — Telephone Encounter (Signed)
-----   Message from [name] sent at 07/11/2018 12:44 PM EDT -----  Regarding: Dr. Owens Shark patient  [name] [id number], 62 year old, female, Telephone Information:  Home Phone      [phone]  Work Phone      [phone]  Mobile          [phone]      Patient's Preferred Pharmacy:     [facility] - [address], [place]  Phone: [phone] Fax: [phone]      CONFIRMED TODAY: Samantha Cameron NUMBER: [phone] - daughter, Angelica Pou time to call back:  anytime  Cell phone:   Other phone:    Available times:    Patient's language of care: English    Patient does not need an interpreter.    Patient's PCP: Driscilla Grammes, MD    Person calling on behalf of patient: Daughter    Calls today to speak to nurse only.    Pts daughter Samantha Cameron) calls today to speak with nurse directly about her mother's visit today with Dr. Owens Shark. She would like more clarity as her mother did not understand too much of what was said at the visit.

## 2018-07-11 NOTE — Patient Instructions (Signed)
Patient Education     Food Choices for Gastroesophageal Reflux Disease, Adult  When you have gastroesophageal reflux disease (GERD), the foods you eat and your eating habits are very important. Choosing the right foods can help ease your discomfort.  What guidelines do I need to follow?  · Choose fruits, vegetables, whole grains, and low-fat dairy products.  · Choose low-fat meat, fish, and poultry.  · Limit fats such as oils, salad dressings, butter, nuts, and avocado.  · Keep a food diary. This helps you identify foods that cause symptoms.  · Avoid foods that cause symptoms. These may be different for everyone.  · Eat small meals often instead of 3 large meals a day.  · Eat your meals slowly, in a place where you are relaxed.  · Limit fried foods.  · Cook foods using methods other than frying.  · Avoid drinking alcohol.  · Avoid drinking large amounts of liquids with your meals.  · Avoid bending over or lying down until 2-3 hours after eating.  What foods are not recommended?  These are some foods and drinks that may make your symptoms worse:  Vegetables   Tomatoes. Tomato juice. Tomato and spaghetti sauce. Chili peppers. Onion and garlic. Horseradish.  Fruits   Oranges, grapefruit, and lemon (fruit and juice).  Meats   High-fat meats, fish, and poultry. This includes hot dogs, ribs, ham, sausage, salami, and bacon.  Dairy   Whole milk and chocolate milk. Sour cream. Cream. Butter. Ice cream. Cream cheese.  Drinks   Coffee and tea. Bubbly (carbonated) drinks or energy drinks.  Condiments   Hot sauce. Barbecue sauce.  Sweets/Desserts   Chocolate and cocoa. Donuts. Peppermint and spearmint.  Fats and Oils   High-fat foods. This includes French fries and potato chips.  Other   Vinegar. Strong spices. This includes black pepper, white pepper, red pepper, cayenne, curry powder, cloves, ginger, and chili powder.  The items listed above may not be a complete list of foods and drinks to avoid. Contact your dietitian for  more information.   This information is not intended to replace advice given to you by your health care provider. Make sure you discuss any questions you have with your health care provider.  Document Released: 05/06/2012 Document Revised: 04/12/2016 Document Reviewed: 09/09/2013  Elsevier Interactive Patient Education © 2017 Elsevier Inc.

## 2018-07-12 ENCOUNTER — Other Ambulatory Visit (HOSPITAL_BASED_OUTPATIENT_CLINIC_OR_DEPARTMENT_OTHER): Payer: Self-pay | Admitting: Internal Medicine

## 2018-07-12 DIAGNOSIS — K219 Gastro-esophageal reflux disease without esophagitis: Secondary | ICD-10-CM

## 2018-07-12 DIAGNOSIS — K2970 Gastritis, unspecified, without bleeding: Secondary | ICD-10-CM

## 2018-07-13 ENCOUNTER — Encounter (HOSPITAL_BASED_OUTPATIENT_CLINIC_OR_DEPARTMENT_OTHER): Payer: Self-pay | Admitting: Gastroenterology

## 2018-07-13 NOTE — Progress Notes (Signed)
PER Pharmacy, Samantha Cameron is a 62 year old female has requested a refill of Maalox 200-200 MG/5ML.      Last Office Visit: 06-05-2018 with PCP  Last Physical Exam: 08-15-2015    COLONOSCOPY due on 09/01/2018  DM EYE EXAM due on 09/12/2018    Other Med Adult:  Most Recent BP Reading(s)  07/11/18 : 115/73        Cholesterol (mg/dL)   Date Value   10/28/2017 131     LOW DENSITY LIPOPROTEIN DIRECT (mg/dL)   Date Value   10/28/2017 61     HIGH DENSITY LIPOPROTEIN (mg/dL)   Date Value   10/28/2017 47     TRIGLYCERIDES (mg/dL)   Date Value   10/28/2017 240 (H)         THYROID SCREEN TSH REFLEX FT4 (uIU/mL)   Date Value   05/14/2018 1.990         No results found for: TSH    HEMOGLOBIN A1C (%)   Date Value   05/14/2018 6.3 (H)       No results found for: POCA1C      INR (no units)   Date Value   04/18/2017 1.0   05/27/2016 1.0   02/01/2012 < 1.0 (L)       SODIUM (mmol/L)   Date Value   07/06/2018 142       POTASSIUM (mmol/L)   Date Value   07/06/2018 4.1           CREATININE (mg/dL)   Date Value   07/06/2018 0.8       Documented patient preferred pharmacies:    [facility] - [address], [place]  Phone: [phone] Fax: [phone]

## 2018-07-14 ENCOUNTER — Other Ambulatory Visit (HOSPITAL_BASED_OUTPATIENT_CLINIC_OR_DEPARTMENT_OTHER): Payer: Self-pay | Admitting: Social Worker

## 2018-07-14 ENCOUNTER — Ambulatory Visit: Payer: Medicare Other | Attending: Psychiatry

## 2018-07-14 ENCOUNTER — Telehealth (HOSPITAL_BASED_OUTPATIENT_CLINIC_OR_DEPARTMENT_OTHER): Payer: Self-pay

## 2018-07-14 DIAGNOSIS — F410 Panic disorder [episodic paroxysmal anxiety] without agoraphobia: Secondary | ICD-10-CM | POA: Diagnosis present

## 2018-07-14 NOTE — Progress Notes (Signed)
PT-1 Request [id number] is Pending        Trip Summary  Member Home:  [place] [id number] [place] [facility] [id number]   Treating Location:  [facility] [address]  [place] [address]    Treatment Details Edit   Treating facility within [facility] locality Yes   Medical treatment type: Z00-Z99 - Factors influencing health status and contact with health services   Duration: 12 Month(s)   Frequency: 1 visit(s) per Week   Transportation Details Edit   Member will require a wheelchair van No   Member will be accompanied by an escort Yes   Member will require a service animal No

## 2018-07-14 NOTE — Progress Notes (Signed)
Spoke to the patient and relayed message from Dr. Trinda Pascal  Pt voiced understanding and is requesting to cancel appt  Pt inquiring if she can take zantac 300mg  BID instead of one capsule at night  Reports having severe GERD last night, almost prompted her to go to the ED  She also requests a new Rx for carafate as she is out of refills  RN advised her message will be sent to Dr. Owens Shark for input

## 2018-07-14 NOTE — Progress Notes (Signed)
OUTPATIENT PSYCHIATRY PROGRESS NOTE    INTERPRETER: No    CONTACT INFO FOR OTHER AGENCIES AND MENTAL HEALTH PROVIDERS (IF APPLICABLE): N/A    PROBLEM(S) ADDRESSED IN THIS SESSION:     1. Physical health issues  2. Anxiety  3. Hx of loss/grief response      SUBJECTIVE  TODAY'S CHIEF COMPLAINT AND CLINICAL UPDATES IN PATIENT'S WORDS:  1) Chief Complaint (Patient and/or guardian's own words, concerns and expressed thoughts):     "I have GERD and it leads to anxiety and panic attacks. I'm constantly going to the hospital"    "I don't sleep. Last week I was up for 4 days straight. I did a sleep study. They said I stopped breathing hundreds of times."    2) New information from patient and/or collateral (Patient's illness: context, course, modifying factors, severity, cultural, family, social, medical history): pt endorses previous dx of GERD, spinal stenosis, sciatica, and anxiety. Pt reports that she struggles w/acid reflux that leads to panic attacks.     OBJECTIVE  DATA REVIEWED (Consider medical labs, radiology, other medical tests; screening/outcome measures; psychological testing; discussion of test results with other clinicians; consultation with other clinicians and systems involved with patient, summary of old records): previous note/eval from intake clinician    CURRENT MEDICATIONS (make clear medications prescribed by psychiatry; include OTC medications):  Current Outpatient Medications   Medication Sig    SM ANTACID ADVANCED 200-200-20 MG/5ML suspension TAKE 30ML BY MOUTH EVERY 6 (SIX) HOURS AS NEEDED FOR INDIGESTION    ranitidine (ZANTAC) 300 MG capsule Take 1 capsule by mouth every evening    omeprazole (PRILOSEC) 40 MG capsule Take 1 capsule by mouth 2 (two) times daily take 1 capsule by mouth twice a day    fluticasone (FLONASE) 50 MCG/ACT nasal spray 1 spray by Each Nostril route daily    albuterol (PROVENTIL) (2.5 MG/3ML) 0.083% nebulizer solution Take 3 mLs by nebulization every 6 (six) hours as  needed for Wheezing    metoprolol (TOPROL-XL) 25 MG 24 hr tablet Take 1 tablet by mouth daily    lisinopril (PRINIVIL,ZESTRIL) 20 MG tablet take 1 tablet by mouth once daily    ONETOUCH DELICA LANCETS FINE MISC TEST once daily    metFORMIN (GLUCOPHAGE-XR) 500 MG 24 hr tablet take 4 tablets by mouth once daily WITH BREAKFAST    atorvastatin (LIPITOR) 20 MG tablet take 1 tablet by mouth once daily    ONETOUCH VERIO test strip TEST once daily    albuterol (PROVENTIL HFA,VENTOLIN HFA, PROAIR HFA) 108 (90 Base) MCG/ACT inhaler Inhale 2 puffs into the lungs every 6 (six) hours as needed for Wheezing or Shortness of breath    Omega-3 Fatty Acids (FISH OIL) 1000 MG CAPS Take by mouth    cholecalciferol (VITAMIN D3) 1000 UNIT tablet Take 1 tablet by mouth daily    pregabalin (LYRICA) 100 MG capsule Take 100 mg by mouth 2 (two) times daily With Dr Dwyane Dee    oxycodone-acetaminophen (PERCOCET) 5-325 MG per tablet Take 1 tablet by mouth 2 (two) times daily as needed With Dr Dwyane Dee    aspirin 81 MG tablet Take 81 mg by mouth daily.     No current facility-administered medications for this visit.        MEDICATION ADHERENCE (including barriers and how addressed):Yes    MEDICATION SIDE EFFECTS (Prescribers Only): N/A    BIRTH CONTROL (ask females and males): N/A    CURRENT PREGNANCY: N/A      MENTAL STATUS EXAMINATION  Appearance: Appearing stated age, well groomed  Behavior: WNL  Alertness:  WNL  Speech:  WNL  Mood: "I'm tired of all of this"  Affect:  Frustrated, congruent  Thought Process: WNL  Thought Content:  Past history, medical problems  Perceptions:  None  Judgment/Impulse Control: Good    Insight:  Fair   Cognition: Good  Suicidal/Homicidal: Denies SI/HI     ASSESSMENT   Today's Assessment:  -TW met with pt after she had an evaluation with another clinician  -Pt discussed physical health issues mostly and seemed to somaticize anxiety and depression  -Pt stressed that she is suffering from severe  insomnia  -Pt states that she feels her concerns about her GERD aren't taking seriously   -Pt describes that she has had many emergency room visits due to her experiencing acid reflux, which she says makes her feel that she can't breathe/ take a breath, and that she panics.   -Pt complains that her doctor will not prescribe Valium for sleep and anxiety, feeling that maybe he doesn't really care about her concerns.   -Pt has a hx of loss: 5 yrs ago, her son died from a heroin/fentanyl overdose and a yr later, her father passed away    Risk Level Assessment  Risk Level Change (if yes, please describe): No    Suicide: low (1)  Violence: low (1)  Addiction: low        Protective Factors: some supportive family, stable housing    DIAGNOSES ASSESSED TODAY (psychiatric diagnoses and medical diagnoses that factor into management of psychiatric treatment): panic d/o    CLINICAL FORMULATION (Make changes as your understanding changes. Should coincide with treatment plan):      Pt is a 62yo divorced woman, current guardian of 29yo granddaughter, of [place] descent, appearing in treatment with medical compliance and complaints of insomnia and acid reflux that results in SOB and panic attacks. Pt states that she was told by her doctor that she should see a therapist. Pt states that frequent acid reflux and panic have led to multiple trips to the ED. Pt reports feeling that people in the ED and her doctor are not taking her concerns seriously, and she appears to not have much insight around how anxiety symptoms may be contributing to her ED visits. Pt also has a history of the traumatic loss of her son due to an overdose. Pt may benefit from therapy to learn methods of managing anxiety and panic symptoms, to reduce ED visits and insomnia.    Ethnic and cultural factors: Grew up strict [place] family  Religious and spiritual beliefs, values and preferences: Grew [employer], still religious but does not attend church  regularly.    REVIEWING TODAY'S VISIT  CLINICAL INTERVENTIONS TODAY: took extended hx; worked on [employer], television/film set; discussed physical health issues and how anxiety can manifest in different ways in the body; identified areas of focus for tx    PATIENT'S RESPONSE TO INTERVENTIONS: pt was well-engaged, supportive of the process, and receptive to this writer's interventions mostly. Pt demonstrates difficulty understanding how acid reflux and insomnia are related to anxiety and grief    PROGRESS TOWARDS GOALS: ongoing    TIME SPENT IN PSYCHOTHERAPY: 45 min    [place] (Consider risk plan for patients at moderate or high risk for suicide/violence/addiction; medication plan; referrals, etc. Must coincide with treatment plan.): N/A    PLAN FOR ONGOING TREATMENT:  Individual therapy, meeting e/o week  INFORMED CONSENT (for any new treatment): N/A      ONLY FOR PRESCRIBERS DOING EVALUATION AND MANAGEMENT VISITS     Use only when no psychotherapy performed  COUNSELING AND COORDINATION OF CARE PROVIDED (Consider diagnostic results/impressions and/or recommended studies; risks and benefits of treatment options; instruction for management/treatment and/or follow-up; importance of compliance with chosen treatment options; risk factor reduction; patient/family/caregiver education; prognosis): N/A      Over 50% of time during today's visit was devoted to counseling and/or coordination of care.  If yes, record estimated duration of the face to face encounter.  N/A    INSTRUCTIONS TO COVERING CLINICIANS:  N/A      .

## 2018-07-14 NOTE — Progress Notes (Signed)
Thank you for your referral, patient called and rescheduled intake appointment with Delynn Flavin from 9/29 to 08/11/2018. Looking to be seen sooner.

## 2018-07-14 NOTE — Progress Notes (Signed)
Samantha Gala, MD  [name], RN; Retta Diones., MD; Rikki Spearing, PA-C; [facility] 3 days ago      No need to see me, patient already saw Dr Owens Shark (unless Dr Owens Shark wanted her to see me for something specific).     She can cancel the apportionment with me         Daine Gravel, no answer  Identifier on voicemail  LVM relaying above message from Dr. Trinda Pascal and to callback if she'd like to cancel appt

## 2018-07-16 ENCOUNTER — Telehealth (HOSPITAL_BASED_OUTPATIENT_CLINIC_OR_DEPARTMENT_OTHER): Payer: Self-pay

## 2018-07-16 ENCOUNTER — Encounter (HOSPITAL_BASED_OUTPATIENT_CLINIC_OR_DEPARTMENT_OTHER): Payer: Self-pay | Admitting: Internal Medicine

## 2018-07-16 ENCOUNTER — Ambulatory Visit: Payer: Medicare Other | Attending: Internal Medicine | Admitting: Internal Medicine

## 2018-07-16 VITALS — BP 111/62 | HR 91 | Ht 60.83 in | Wt 220.0 lb

## 2018-07-16 DIAGNOSIS — Z1231 Encounter for screening mammogram for malignant neoplasm of breast: Secondary | ICD-10-CM | POA: Diagnosis present

## 2018-07-16 DIAGNOSIS — F410 Panic disorder [episodic paroxysmal anxiety] without agoraphobia: Secondary | ICD-10-CM | POA: Diagnosis not present

## 2018-07-16 DIAGNOSIS — R809 Proteinuria, unspecified: Secondary | ICD-10-CM | POA: Diagnosis not present

## 2018-07-16 DIAGNOSIS — K2950 Unspecified chronic gastritis without bleeding: Secondary | ICD-10-CM | POA: Diagnosis not present

## 2018-07-16 DIAGNOSIS — Z1239 Encounter for other screening for malignant neoplasm of breast: Secondary | ICD-10-CM

## 2018-07-16 DIAGNOSIS — K2100 Gastro-esophageal reflux disease with esophagitis, without bleeding: Secondary | ICD-10-CM

## 2018-07-16 DIAGNOSIS — E1129 Type 2 diabetes mellitus with other diabetic kidney complication: Secondary | ICD-10-CM | POA: Insufficient documentation

## 2018-07-16 MED ORDER — LIDOCAINE VISCOUS HCL 2 % MT SOLN: 10 mL | mL | Freq: Three times a day (TID) | OROMUCOSAL | 1 refills | 0 days | Status: DC | PRN

## 2018-07-16 MED ORDER — LIDOCAINE VISCOUS HCL 2 % MT SOLN
10.0000 mL | Freq: Three times a day (TID) | OROMUCOSAL | 1 refills | Status: DC | PRN
Start: 2018-07-16 — End: 2018-08-02

## 2018-07-16 MED ORDER — SUCRALFATE 1 GM/10ML PO SUSP
1.0000 g | Freq: Four times a day (QID) | ORAL | 1 refills | Status: DC
Start: 2018-07-16 — End: 2018-09-09

## 2018-07-16 MED ORDER — SUCRALFATE 1 GM/10ML PO SUSP: 1 g | mL | Freq: Four times a day (QID) | ORAL | 1 refills | 0 days | Status: AC

## 2018-07-16 NOTE — Telephone Encounter (Signed)
-----  Message from [name] sent at 07/16/2018 11:55 AM EDT -----  Regarding: Dr Barnet Pall: [phone]  [place] [id number], 62 year old, female, Telephone Information:  Home Phone      [phone]  Work Phone      [phone]  Mobile          [phone]      Patient's Preferred Pharmacy:     [facility] - [address], [place]  Phone: [phone] Fax: [phone]      CONFIRMED TODAY: [name] NUMBER: [phone]  Best time to call back: any  Cell phone:   Other phone:    Available times:    Patient's language of care: English    Patient does not need an interpreter.    Patient's PCP: Driscilla Grammes, MD    Person calling on behalf of patient: Patient (self)    Calls today with questions and concerns.    Pt called because she's still waiting for her appt to be booked at [facility] she was told she was going to get a call this week. Please call back with information. Thank you

## 2018-07-16 NOTE — Progress Notes (Signed)
Called the patient and told her the front desk will get back to her when it is scheduled.     She also called the other day and would like to take zantac 300 mg twice a day as her symptoms are worse.     I told her I would send the message to Dr Owens Shark and the front desk.

## 2018-07-16 NOTE — Patient Instructions (Signed)
Patient Education     Food Choices for Gastroesophageal Reflux Disease, Adult  When you have gastroesophageal reflux disease (GERD), the foods you eat and your eating habits are very important. Choosing the right foods can help ease the discomfort of GERD. Consider working with a diet and nutrition specialist (dietitian) to help you make healthy food choices.  What general guidelines should I follow?  Eating plan   · Choose healthy foods low in fat, such as fruits, vegetables, whole grains, low-fat dairy products, and lean meat, fish, and poultry.  · Eat frequent, small meals instead of three large meals each day. Eat your meals slowly, in a relaxed setting. Avoid bending over or lying down until 2-3 hours after eating.  · Limit high-fat foods such as fatty meats or fried foods.  · Limit your intake of oils, butter, and shortening to less than 8 teaspoons each day.  · Avoid the following:  ? Foods that cause symptoms. These may be different for different people. Keep a food diary to keep track of foods that cause symptoms.  ? Alcohol.  ? Drinking large amounts of liquid with meals.  ? Eating meals during the 2-3 hours before bed.  · Cook foods using methods other than frying. This may include baking, grilling, or broiling.  Lifestyle     · Maintain a healthy weight. Ask your health care provider what weight is healthy for you. If you need to lose weight, work with your health care provider to do so safely.  · Exercise for at least 30 minutes on 5 or more days each week, or as told by your health care provider.  · Avoid wearing clothes that fit tightly around your waist and chest.  · Do not use any products that contain nicotine or tobacco, such as cigarettes and e-cigarettes. If you need help quitting, ask your health care provider.  · Sleep with the head of your bed raised. Use a wedge under the mattress or blocks under the bed frame to raise the head of the bed.  What foods are not recommended?  The items listed  may not be a complete list. Talk with your dietitian about what dietary choices are best for you.  Grains   Pastries or quick breads with added fat. French toast.  Vegetables   Deep fried vegetables. French fries. Any vegetables prepared with added fat. Any vegetables that cause symptoms. For some people this may include tomatoes and tomato products, chili peppers, onions and garlic, and horseradish.  Fruits   Any fruits prepared with added fat. Any fruits that cause symptoms. For some people this may include citrus fruits, such as oranges, grapefruit, pineapple, and lemons.  Meats and other protein foods   High-fat meats, such as fatty beef or pork, hot dogs, ribs, ham, sausage, salami and bacon. Fried meat or protein, including fried fish and fried chicken. Nuts and nut butters.  Dairy   Whole milk and chocolate milk. Sour cream. Cream. Ice cream. Cream cheese. Milk shakes.  Beverages   Coffee and tea, with or without caffeine. Carbonated beverages. Sodas. Energy drinks. Fruit juice made with acidic fruits (such as orange or grapefruit). Tomato juice. Alcoholic drinks.  Fats and oils   Butter. Margarine. Shortening. Ghee.  Sweets and desserts   Chocolate and cocoa. Donuts.  Seasoning and other foods   Pepper. Peppermint and spearmint. Any condiments, herbs, or seasonings that cause symptoms. For some people, this may include curry, hot sauce, or vinegar-based salad dressings.    Summary  · When you have gastroesophageal reflux disease (GERD), food and lifestyle choices are very important to help ease the discomfort of GERD.  · Eat frequent, small meals instead of three large meals each day. Eat your meals slowly, in a relaxed setting. Avoid bending over or lying down until 2-3 hours after eating.  · Limit high-fat foods such as fatty meat or fried foods.  This information is not intended to replace advice given to you by your health care provider. Make sure you discuss any questions you have with your health care  provider.  Document Released: 11/05/2005 Document Revised: 11/06/2016 Document Reviewed: 11/06/2016  Elsevier Interactive Patient Education © 2017 Elsevier Inc.

## 2018-07-17 NOTE — Progress Notes (Signed)
Samantha Cameron sent to [name], RN  Caller: Unspecified (Yesterday, 12:54 PM)        Patient is scheduled at [facility] on Tuesday, October 15th at 2pm arrival time.     Called the patient and gave her the message.          She still would like a prescription for zantac bid as she is stilling having sever GERD. Dr Paulina Fusi did reorder her carafate and lidocaine.

## 2018-07-18 ENCOUNTER — Other Ambulatory Visit (HOSPITAL_BASED_OUTPATIENT_CLINIC_OR_DEPARTMENT_OTHER): Payer: Self-pay

## 2018-07-18 NOTE — Progress Notes (Signed)
Rikki Spearing, PA-C sent to [name], RN; Retta Diones., MD  Caller: Unspecified (2 days ago, 12:54 PM)        Christie Beckers,     Per Dr. Saul Fordyce note the patient's regimen is omeprazole 20 mg BID, zantac 300 qhs, and carafate TID. Is this what she is taking?     Also per earlier our conversation earlier today with Dr. Owens Shark, please have her schedule f/u back in clinic with Dr. Trinda Pascal for 2-3 weeks after pH study at [facility].     Thank you,   Judson Roch          called the patient and she is taking the medication plus lidocaine with maalox when pain is severe. She still wants to know about Zantac BD.

## 2018-07-18 NOTE — Progress Notes (Signed)
CCC completed chart review for the following information.   Admissions - 6/20-21  ED visits in 6 months - 13  Poorly managed Mental Health/Substance Use -  Possible. [facility] has attempted to engage  Risk Score - 61   Admission/Discharge Dx (COPD, CHF) - No  Age 64+ with pneumonia and hx of COPD - No  A1C - 6.3  Serious life threatening or life limiting illness (e.g. cancer, parkinson's or other neurological disorder) - Not indicated  Services:  None  Based on the above, Pt could benefit from CCM referral.  Will discuss with team.      [facility] to [facility] Collaboration  Phone: [phone]  [email or web address]

## 2018-07-20 ENCOUNTER — Encounter (HOSPITAL_BASED_OUTPATIENT_CLINIC_OR_DEPARTMENT_OTHER): Payer: Self-pay

## 2018-07-20 ENCOUNTER — Emergency Department
Admission: AD | Admit: 2018-07-20 | Discharge: 2018-07-20 | Disposition: A | Discharge status: Home / Self Care | Payer: Medicare Other | Source: Intra-hospital | Attending: Emergency Medicine | Admitting: Emergency Medicine

## 2018-07-20 DIAGNOSIS — R110 Nausea: Secondary | ICD-10-CM | POA: Diagnosis not present

## 2018-07-20 DIAGNOSIS — R1013 Epigastric pain: Secondary | ICD-10-CM | POA: Diagnosis present

## 2018-07-20 DIAGNOSIS — I10 Essential (primary) hypertension: Secondary | ICD-10-CM | POA: Diagnosis not present

## 2018-07-20 DIAGNOSIS — K2950 Unspecified chronic gastritis without bleeding: Secondary | ICD-10-CM | POA: Diagnosis not present

## 2018-07-20 DIAGNOSIS — Z87891 Personal history of nicotine dependence: Secondary | ICD-10-CM | POA: Diagnosis not present

## 2018-07-20 DIAGNOSIS — K209 Esophagitis, unspecified without bleeding: Secondary | ICD-10-CM

## 2018-07-20 MED ORDER — SODIUM CHLORIDE 0.9 % IV BOLUS
1000.0000 mL | Freq: Once | INTRAVENOUS | Status: AC
Start: 2018-07-20 — End: 2018-07-20
  Administered 2018-07-20: 1000 mL via INTRAVENOUS

## 2018-07-20 MED ORDER — ONDANSETRON HCL 4 MG/2ML IJ SOLN
4.0000 mg | Freq: Once | INTRAMUSCULAR | Status: AC
Start: 2018-07-20 — End: 2018-07-20
  Administered 2018-07-20: 4 mg via INTRAVENOUS
  Filled 2018-07-20: qty 2

## 2018-07-20 MED ORDER — ALUMINUM & MAGNESIUM HYDROXIDE 200-200 MG/5ML PO SUSP
30.0000 mL | Freq: Once | ORAL | Status: AC
Start: 2018-07-20 — End: 2018-07-20
  Administered 2018-07-20: 30 mL via ORAL
  Filled 2018-07-20: qty 30

## 2018-07-20 MED ORDER — DICYCLOMINE HCL 10 MG PO CAPS: 10 mg | capsule | Freq: Four times a day (QID) | ORAL | 0 refills | 0 days | Status: DC

## 2018-07-20 MED ORDER — DICYCLOMINE HCL 10 MG PO CAPS
10.0000 mg | ORAL_CAPSULE | Freq: Four times a day (QID) | ORAL | 0 refills | Status: DC
Start: 2018-07-20 — End: 2018-07-25

## 2018-07-20 MED ORDER — LIDOCAINE VISCOUS HCL 2 % MT SOLN
10.0000 mL | Freq: Once | OROMUCOSAL | Status: AC
Start: 2018-07-20 — End: 2018-07-20
  Administered 2018-07-20: 10 mL via OROMUCOSAL
  Filled 2018-07-20: qty 15

## 2018-07-20 MED ORDER — ACETAMINOPHEN 500 MG PO TABS
1000.0000 mg | ORAL_TABLET | Freq: Once | ORAL | Status: AC
Start: 2018-07-20 — End: 2018-07-20
  Administered 2018-07-20: 1000 mg via ORAL
  Filled 2018-07-20: qty 2

## 2018-07-20 MED ORDER — FAMOTIDINE 20 MG/2ML IV SOLN
20.0000 mg | Freq: Once | INTRAVENOUS | Status: AC
Start: 2018-07-20 — End: 2018-07-20
  Administered 2018-07-20: 20 mg via INTRAVENOUS
  Filled 2018-07-20: qty 2

## 2018-07-20 MED ORDER — SUCRALFATE 1 GM/10ML PO SUSP
1.00 g | Freq: Once | ORAL | Status: AC
Start: 2018-07-20 — End: 2018-07-20
  Administered 2018-07-20: 1 g via ORAL
  Filled 2018-07-20: qty 10

## 2018-07-20 MED ORDER — DICYCLOMINE HCL 10 MG PO CAPS
10.00 mg | ORAL_CAPSULE | Freq: Once | ORAL | Status: AC
Start: 2018-07-20 — End: 2018-07-20
  Administered 2018-07-20: 10 mg via ORAL
  Filled 2018-07-20: qty 1

## 2018-07-20 NOTE — Narrator Note (Signed)
Pt reports feeling slightly better, but still has abdominal pain/bloating.  Pt lying in stretcher in no distress.

## 2018-07-20 NOTE — Narrator Note (Signed)
Patient Disposition    Patient education for diagnosis, medications, activity, diet and follow-up.  Patient left ED 10:50 PM.  Patient rep received written instructions.  Interpreter to provide instructions: No    Patient belongings with patient: YES    Have all existing LDAs been addressed? Yes    Have all IV infusions been stopped? Yes    Discharged to: Discharged to home

## 2018-07-20 NOTE — Narrator Note (Signed)
IV established, medicated as ordered.  Pt tolerates PO well.

## 2018-07-20 NOTE — Narrator Note (Signed)
MD at bedside.  Awaiting further orders/dispo.

## 2018-07-20 NOTE — ED Provider Notes (Signed)
ED nursing record was reviewed. Prior medical records as available through Epic were reviewed.    This patient was seen with Emergency Department attending physician Dr. Glori Bickers    HPI:    Samantha Cameron is a 62 year old female with past medical history significant for GERD, gastritis, esophagitis, type 2 diabetes who presents at 2026.    Patient presents for evaluation of epigastric pain.  Similar to prior.  Increased in severity last night through today.  She feels nauseous without vomiting.  Denies diarrhea, constipation.  Tried her home medication regimen, though was not successful.  Denies fevers, chest pain, shortness of breath.    ROS: Pertinent positives were reviewed as per the HPI above. All other systems were reviewed and are negative.  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) ([place])  09/14/2015: Cough  No date: Depression      Comment:  per steward records  No date: Diabetes  No date: Disorders of lipoid metabolism  No date: Esophageal reflux  No date: External  hemorrhoid      Comment:  per steward records  No date: HTN (hypertension)  02/01/2012: Hypoxia  02/16/2017: Left ear pain  06/07/2015: Left genital labial abscess  06/16/2015: Obesity, Class III, 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y) ([place])  08/16/2015: Osteopenia      Comment:  On xray of left knee per steward records 06/2014 Unclear                if addressed  05/23/2018: Pancreatic lesion  09/14/2015: Right ear impacted cerumen  08/15/2015: Type 2 diabetes mellitus without complication, without   long-term current use of insulin (HCC)      Comment:   HEMOGLOBIN A1C (%) Date Value 07/15/2015 6.2 (H)                ----------  Steward record - a1c 6.2 on 01/31/15 6.4 on                09/30/14 5.9 on 12/29/13 6.4 08/18/13   11/18/2017: Viral URI with cough  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) ([place])     Tobacco use disorder     Spinal stenosis of lumbar region  HLD (hyperlipidemia)     Chronic bilateral low back pain with bilateral sciatica     Skin change     Pineal gland cyst     History of vitamin D deficiency     Hemorrhoids     Venous (peripheral) insufficiency     Adrenal adenoma     Type 2 diabetes mellitus with microalbuminuria, without long-term current use of insulin (HCC)     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 (HCC)     Acute pain of right wrist     Abnormal PFTs     Screening for malignant neoplasm of breast     Skin irritation     H/O bone density study     Vertigo     Orthostatic hypotension     Social problem     Right arm weakness     Insomnia     Acute pain of left knee     Hx of total knee replacement, left     Acute bacterial sinusitis     Seasonal allergic rhinitis     Left foot pain     Stress due to family tension     Gastritis without bleeding     Leucocytosis     Gastroesophageal reflux disease without esophagitis      HOSP-TO-HOME COMMUNITY COLLABORATION PROGRAM     Epigastric pain     Hx of gastritis     Common bile duct  dilation     Fatty liver     Pancreatic lesion     Vascular calcification     Umbilical hernia without obstruction and without gangrene     Arthritis of spine     Scoliosis of thoracic spine     Anxiety     Sleep disturbance     Esophagitis     First degree AV block     Obstructive sleep apnea    Past Surgical History: Past Surgical History:  No date: ANES NERVE MUSC TENDON FASCIA&BURSA KNEE&/POPLT  No date: FOOT SURGERY      Comment:  bilateral  No date: LAPAROSCOPY SURG CHOLECYSTECTOMY  No date: OB ANTEPARTUM CARE CESAREAN DLVR & POSTPARTUM      Comment:  x3  No date: TONSILLECTOMY & ADENOIDECTOMY <AGE 51  04/13/2009: TOTAL KNEE REPLACEMENT      Comment:  left  08/11/2009: WRIST GANGLION EXCISION      Comment:  left   Social History:   Social History     Socioeconomic History    Marital status: Divorced     Spouse name: Not on file    Number of children: Not on file    Years of education: Not on file    Highest education level: Not on file   Occupational History    Not on file   Social Needs    Financial resource strain: Not on file    Food insecurity:     Worry: Not on file     Inability: Not on file    Transportation needs:     Medical: Not on file     Non-medical: Not on file   Tobacco Use    Smoking status: Former Smoker     Packs/day: 0.50     Years: 31.00     Pack years: 15.50     Types: Cigarettes     Last attempt to quit: 03/10/2018     Years since quitting: 0.3    Smokeless tobacco: Never Used  Tobacco comment: quit smoking inform provided to patient   Substance and Sexual Activity    Alcohol use: No    Drug use: No    Sexual activity: Not Currently     Partners: Male     Birth control/protection: Tubal Ligation   Lifestyle    Physical activity:     Days per week: Not on file     Minutes per session: Not on file    Stress: Not on file   Relationships    Social connections:     Talks on phone: Not on file     Gets together: Not on file     Attends religious service: Not on file     Active  member of club or organization: Not on file     Attends meetings of clubs or organizations: Not on file     Relationship status: Not on file    Intimate partner violence:     Fear of current or ex partner: Not on file     Emotionally abused: Not on file     Physically abused: Not on file     Forced sexual activity: Not on file   Other Topics Concern    Not on file   Social History Narrative    Lives with son    1 daughter and another son passed away    Disabled from back    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 Simons,MD, 07/15/2015, 2:56 PM           Allergies: Review of Patient's Allergies indicates:   Codeine camsylate       Shortness of Breath, Other (See                            Comments)    Comment:Chest pain   Motrin [ibuprofen]      Nausea Only   Sulfa antibiotics       Other (See Comments)   Bactrim                 Rash, Itching  Immunizations:   Immunization History   Administered Date(s) Administered    HEP B ADULT 3 DOSE 20 and > 08/15/2015, 09/14/2015, 05/17/2016    INFLUENZA VIRUS TRI W/PRESV VACCINE 18/> YRS IM (PRIVATE) 10/14/2007, 09/17/2008, 09/01/2009, 08/07/2010, 08/14/2011, 09/03/2011, 09/12/2012, 08/18/2013, 08/24/2014, 06/30/2015    Influenza Virus Quad Presv Free Vacc 3/> Yrs IM 06/30/2015, 12/11/2016, 07/02/2017    Influenza Virus Quadrivalent Vacc 3/> Yrs Im 06/30/2015, 07/02/2017, 07/01/2018    Influenza Virus Tri Presv Free 3/> YRS IM 07/03/2016    PNEUMOCOCCAL POLYSACCHARIDE VACCINE v23 03/12/2014, 08/15/2015, 12/01/2015    Pneumococcal Vaccine, Conjugate V7 01/31/2009    Td 11/01/2001    Tdap 09/17/2008, 12/11/2016    ZOSTER SHINGLES VACC, LIVE SC 08/24/2008, 12/01/2015    Zoster Vacc (HZV) Recomb Adjv, IM, 2 Dose, ([place]) 05/30/2017, 07/17/2017           Medications:  Prior to Admission Medications   Prescriptions Last Dose Informant Patient Reported? Taking?   ONETOUCH DELICA LANCETS FINE MISC   No No   Sig: TEST once daily  Omega-3 Fatty Acids (FISH OIL) 1000 MG CAPS   Yes No   Sig: Take by mouth   SM ANTACID ADVANCED 200-200-20 MG/5ML suspension   No No   Sig: TAKE 30ML BY MOUTH EVERY 6 (SIX) HOURS AS NEEDED FOR INDIGESTION   albuterol (PROVENTIL HFA,VENTOLIN HFA, PROAIR HFA) 108 (90 Base) MCG/ACT inhaler   No No   Sig: Inhale 2 puffs into the lungs every 6 (six) hours as needed for Wheezing or Shortness of breath   albuterol (PROVENTIL) (2.5 MG/3ML) 0.083% nebulizer solution   No No   Sig: Take 3 mLs by nebulization every 6 (six) hours as needed for Wheezing   aspirin 81 MG tablet   Yes No   Sig: Take 81 mg by mouth daily.   atorvastatin (LIPITOR) 20 MG tablet   No No   Sig: take 1 tablet by mouth once daily   cholecalciferol (VITAMIN D3) 1000 UNIT tablet   Yes No   Sig: Take 1 tablet by mouth daily   fluticasone (FLONASE) 50 MCG/ACT nasal spray   No No   Sig: 1 spray by Each Nostril route daily   lidocaine (XYLOCAINE) 2 % solution   No No   Sig: Take 10 mLs by mouth every 8 (eight) hours as needed for Pain   lisinopril (PRINIVIL,ZESTRIL) 20 MG tablet   No No   Sig: take 1 tablet by mouth once daily   metFORMIN (GLUCOPHAGE-XR) 500 MG 24 hr tablet   No No   Sig: take 4 tablets by mouth once daily WITH BREAKFAST   metoprolol (TOPROL-XL) 25 MG 24 hr tablet   No No   Sig: Take 1 tablet by mouth daily   omeprazole (PRILOSEC) 40 MG capsule   No No   Sig: Take 1 capsule by mouth 2 (two) times daily take 1 capsule by mouth twice a day   oxycodone-acetaminophen (PERCOCET) 5-325 MG per tablet   Yes No   Sig: Take 1 tablet by mouth 2 (two) times daily as needed With Dr Dwyane Dee   pregabalin (LYRICA) 100 MG capsule   Yes No   Sig: Take 100 mg by mouth 2 (two) times daily With Dr Dwyane Dee   ranitidine (ZANTAC) 300 MG capsule   No No   Sig: Take 1 capsule by mouth every  evening   sucralfate (CARAFATE) 1 GM/10ML suspension   No No   Sig: Take 10 mLs by mouth 4 (four) times daily      Facility-Administered Medications: None     Physical Exam:  Patient Vitals for the past 72 hrs:   Temp Pulse Resp BP SpO2   07/20/18 2034 98.2 F 72 18 125/85 97 %     GENERAL:  WAWN, no acute distress, non-toxic   SKIN:  Warm & dry, no rash  HEENT:  NCAT; EOMI. Sclerae are anicteric and aninjected  LUNGS:  Clear to auscultation bilaterally. No wheezes, rales, rhonchi  HEART:  RRR.  No murmurs, rubs, or gallops  ABDOMEN:  Soft, NTND.  No masses.  No involuntary guarding or rebound   MSK/EXTREMITIES:  No obvious deformities.  Warm and well perfused.  No cyanosis, no edema  NEUROLOGIC:  Alert, oriented; moves all extremities; speaking in sentences. Normal gait without ataxia  PSYCHIATRIC:  Appropriate for age, time of day, and situation    Medications Given in the ED:  Medications   sodium chloride 0.9 % IV bolus 1,000 mL (1,000 mLs Intravenous New Bag 07/20/18 2105)   ondansetron (ZOFRAN)  injection 4 mg (4 mg Intravenous Given 07/20/18 2105)   famotidine (PEPCID) injection 20 mg (20 mg Intravenous Given 07/20/18 2105)   lidocaine (XYLOCAINE) 2 % viscous solution 10 mL (10 mLs Mouth/Throat Given 07/20/18 2105)   aluminum-magnesium hydroxide (MAALOX) 200-200 mg/5 mL suspension 30 mL (30 mLs Oral Given 07/20/18 2105)   acetaminophen (TYLENOL) tablet 1,000 mg (1,000 mg Oral Given 07/20/18 2105)   sucralfate (CARAFATE) 1 GM/10ML suspension 1 g (1 g Oral Given 07/20/18 2113)    Radiology Results:  n/a   Lab Results (abnormal results only):  Labs Reviewed - No data to display Other Results/Old Record review (e.g. ECG):  n/a     ED Course and Medical Decision-making:  Patient's past medical history/problem list, past surgical history, medication list, social history and allergies reviewed. Pertinent labs & imaging studies reviewed.  Prior records reviewed. Nursing notes reviewed.    GEORGENA WEISHEIT is a 62 year old female  who presented at 2026 for evaluation of epigastric pain.    Patient is well appearing, in no acute distress on presentation.  Afebrile, hemodynamically stable. Physical exam as above.     Presentation overall reassuring despite her pain.  Will defer laboratory evaluation, she has no abdominal tenderness to suggest hepatitis, pancreatitis.  She is s/p cholecystectomy. She has a documented history of esophagitis/gastritis and is followed by [facility].      Feeling improved on reassessment following zofran, pepcid, tylenol, liquid carafate, GI cocktail, IVF.    Stable for discharge with GI follow up. Reports to have an appointment scheduled with [facility] in October.    Patient/parent/guardian has been provided with precautions to return to the ED for new or worsening symptoms, or if unable to obtain follow up care.     Condition on Discharge: Improved and stable    Diagnosis/Diagnoses:   Esophagitis  Chronic gastritis without bleeding, unspecified gastritis type    Dianna Limbo, PA-C, 07/20/2018 9:50 PM  This Emergency Department patient encounter note was created using voice-recognition software and in real time during the ED visit.

## 2018-07-20 NOTE — Discharge Instructions (Signed)
You or your loved one was seen in a [facility] Emergency Department for abdominal pain.  Your evaluation is reassuring that you do not require hospital admission today.    You can try the Bentyl as prescribed for your pain.    Call your primary care provider tomorrow to let them know about your emergency department visit today.     If you do not have a primary care doctor or would like to transfer your primary care to [facility], please call [phone] to set up an appointment.    Return to the emergency room for new or worsening symptoms including pain you cannot control, vomiting where you cannot tolerate fluids, or if you are unable to schedule follow up care.

## 2018-07-20 NOTE — ED Triage Note (Signed)
Patient arrives with her chronic gerd pain has seen her gi doctors and nothing is helping has nausea and severe pain took zantac last night

## 2018-07-22 ENCOUNTER — Telehealth (HOSPITAL_BASED_OUTPATIENT_CLINIC_OR_DEPARTMENT_OTHER): Payer: Self-pay

## 2018-07-22 NOTE — Telephone Encounter (Signed)
-----   Message from Waylan Rocher sent at 07/22/2018 12:11 PM EDT -----  Regarding: ER Visit Sunday  Contact: [phone]  [place]    No patient name on file. No patient ID available, No DOB on File., adult, No relevant phone numbers on file.      Patient's Preferred Pharmacy:   No Pharmacies Listed    CONFIRMED TODAY: Yes    CALL BACK NUMBER: [phone]    Patient's language of care: Data Unavailable    Patient does not need an interpreter.    Patient's PCP: No primary care provider on file.    Person calling on behalf of patient: Patient (self)    Calls today to speak to nurse only.    Patient walk in and said she was seen in ER Sunday. She was told she needs a ultrasound but there is no orders in it. Please give her a call.

## 2018-07-22 NOTE — Progress Notes (Signed)
Pt calling to reschedule appt 9/5 with PCP to 07/25/18.   Lannie Fields, RN, 07/22/2018      [facility] R[facility] Pool   Phone Number: [phone]            Samantha Cameron [id number], 62 year old, female     Calls today: Clinical Questions ([place])    Name of person calling Samantha Cameron   Specific nature of request returning phone call   Return phone number [phone]   Person calling on behalf of patient: Patient (self)       Patient's language of care: English     Patient does not need an interpreter.     Patient's PCP: Driscilla Grammes, MD

## 2018-07-22 NOTE — Progress Notes (Signed)
Called patient back, she reports she was seen in ED on 9/1, was advised she should have a pelvic/abd [place], reviewed ED note which only stated to f/u with pcp, offered appt tomorrow, declined, unable, appt scheduled Thursday.

## 2018-07-23 ENCOUNTER — Encounter (HOSPITAL_BASED_OUTPATIENT_CLINIC_OR_DEPARTMENT_OTHER): Payer: Self-pay | Admitting: Physician Assistant

## 2018-07-23 ENCOUNTER — Encounter (HOSPITAL_BASED_OUTPATIENT_CLINIC_OR_DEPARTMENT_OTHER): Payer: Medicare Other | Admitting: Gastroenterology

## 2018-07-23 DIAGNOSIS — R634 Abnormal weight loss: Secondary | ICD-10-CM | POA: Insufficient documentation

## 2018-07-23 MED ORDER — RANITIDINE HCL 150 MG PO TABS
150.0000 mg | ORAL_TABLET | Freq: Two times a day (BID) | ORAL | 2 refills | Status: DC
Start: 2018-07-23 — End: 2018-08-21

## 2018-07-23 MED ORDER — RANITIDINE HCL 150 MG PO TABS: 150 mg | tablet | Freq: Two times a day (BID) | ORAL | 2 refills | 0 days | Status: DC

## 2018-07-23 NOTE — Progress Notes (Signed)
Rikki Spearing, PA-C sent to [name], RN  Caller: Unspecified (1 week ago, 12:54 PM)        Hi Vaughan Basta,     I just discussed this with Dr. Trinda Pascal. Please let the patient know that she can stop the omeprazole and take zantac 150 mg twice daily or stay on her current regimen. The maximum daily dose of zantac is 300 mg.     Thank you,   Judson Roch            called the patient and she was in the ER Sunday. Bentyl. She is going to try and do the Zantzc twice a daya nd stop the omeprazole.    Hi Cintya Daughety,     I just sent a prescription for zantac 150 mg BID to her pharmacy. Can you please call the patient to let her know?     Thank you,   Linna Hoff and told her.

## 2018-07-24 ENCOUNTER — Encounter (HOSPITAL_BASED_OUTPATIENT_CLINIC_OR_DEPARTMENT_OTHER): Payer: Self-pay

## 2018-07-24 ENCOUNTER — Encounter (HOSPITAL_BASED_OUTPATIENT_CLINIC_OR_DEPARTMENT_OTHER): Payer: Medicare Other | Admitting: Internal Medicine

## 2018-07-24 ENCOUNTER — Ambulatory Visit
Admission: RE | Admit: 2018-07-24 | Discharge: 2018-07-24 | Disposition: A | Discharge status: Home / Self Care | Payer: Medicare Other | Attending: Internal Medicine | Admitting: Internal Medicine

## 2018-07-24 ENCOUNTER — Other Ambulatory Visit (HOSPITAL_BASED_OUTPATIENT_CLINIC_OR_DEPARTMENT_OTHER): Payer: Self-pay

## 2018-07-24 DIAGNOSIS — Z1231 Encounter for screening mammogram for malignant neoplasm of breast: Secondary | ICD-10-CM | POA: Diagnosis present

## 2018-07-24 DIAGNOSIS — Z1239 Encounter for other screening for malignant neoplasm of breast: Secondary | ICD-10-CM

## 2018-07-24 NOTE — Progress Notes (Signed)
CCC discussed case with CCM team for referral.  Appropriate referral, however CCM team is currently at capacity.  CCC will continue to follow until CCM team is able to complete outreach and enrollment.      [facility] to [facility] Collaboration  Phone: [phone]  [email or web address]

## 2018-07-24 NOTE — Progress Notes (Signed)
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) ([place])     Tobacco use disorder     Spinal stenosis of lumbar region     HLD (hyperlipidemia)     Chronic bilateral low back pain with bilateral sciatica     Skin change     Pineal gland cyst     History of vitamin D deficiency     Hemorrhoids     Venous (peripheral) insufficiency     Adrenal adenoma     Type 2 diabetes mellitus with microalbuminuria, without long-term current use of insulin (HCC)     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 (HCC)     Acute pain of right wrist     Abnormal PFTs     Screening for malignant neoplasm of breast     Skin irritation     H/O bone density study     Vertigo     Orthostatic hypotension     Social problem     Right arm weakness     Insomnia     Acute pain of left knee     Hx of total knee replacement, left     Acute bacterial sinusitis     Seasonal allergic rhinitis     Left foot pain     Stress due to family tension     Gastritis without bleeding     Leucocytosis     Gastroesophageal reflux disease without esophagitis      HOSP-TO-HOME COMMUNITY COLLABORATION PROGRAM     Epigastric pain     Hx of gastritis     Common bile duct dilation     Fatty liver     Pancreatic lesion     Vascular calcification     Umbilical hernia without obstruction and without gangrene     Arthritis of spine     Scoliosis of thoracic spine     Anxiety     Sleep disturbance     Esophagitis     First degree AV block     Obstructive sleep apnea     Unintentional weight loss    Ongoing epigastric pain, at times with symptoms in chest leading to ED presentation  12 ED visits and 1 admission since May  Likely gastritis, reflux esophagitis by EGD  No H pylori  Plan for pH study at [facility] per patient - not yet scheduled  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  Taking zantac, ppi, carafate, viscous lido and other  antacids  Was told there may be surgery if not improving in future  Reports hard to sleep due to pain despite meds    Working with therapy on panic symptoms - next 9/12  Trying to get in with psychiatry to discuss medication - scheduled 10/23  gad7 score 2    Also seeing gyn 10/10 for pap    Most Recent BP Reading(s)  07/20/18 : 125/85  07/16/18 : 111/62  07/11/18 : 115/73  07/06/18 : 140/75  06/28/18 : 112/70    Most Recent Weight Reading(s)  07/20/18 : 99.8 kg (220 lb)  07/16/18 : 99.8 kg (220 lb)  07/11/18 : 98.9 kg (218 lb)  07/06/18 : 102.5 kg (226 lb)  06/28/18 : 102.5 kg (226 lb)    HEMOGLOBIN A1C (%)   Date Value   05/14/2018 6.3 (H)  10/28/2017 6.1 (H)   04/10/2017 6.0 (H)     Agrees to mammogram    PE:  BP 111/62  Pulse 91  Ht 5' 0.83" (1.545 m)  Wt 99.8 kg (220 lb)  LMP 10/22/2007 (LMP Unknown)  SpO2 99%  BMI 41.81 kg/m2  Estimated body mass index is 41.81 kg/m as calculated from the following:    Height as of this encounter: 5' 0.83" (1.545 m).    Weight as of this encounter: 99.8 kg (220 lb).  Gen - Morbidly obese female upright in chair in NAD  HEENT - MMM  Abd - not re-examined today  Neuro - A+Ox3  Psych - Anxious    A/P:  (K29.50) Chronic gastritis without bleeding, unspecified gastritis type  (primary encounter diagnosis)  (K21.0) Reflux esophagitis  Comment: Noted on EGD, complicated by panic symptoms as below.  Plan for pH study and GI follow-up - continue current medications  Plan: lidocaine (XYLOCAINE) 2 % solution    (F41.0) Panic disorder  Comment: Leading to frequent ED visits  Plan: Continue therapy  Psychopharm visit 10/23    (E66.01) 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 (HCC)  Comment: By BMI  Plan: Discussed portion control, healthy food choice, exercise    (E11.29,  R80.9) Type 2 diabetes mellitus with microalbuminuria, without long-term current use of insulin (HCC)  Comment: Controlled on metformin  Plan: continue as well as cut down on sweet/sugary foods, white  carbohydrates (bread, pasta, rice) and increase vegetables, whole grains and exercise    (Z12.31) Screening for malignant neoplasm of breast  Plan: [place] SCREENING MAMMO BILATERAL DIGITAL WITH DBT &        CAD

## 2018-07-25 ENCOUNTER — Encounter (HOSPITAL_BASED_OUTPATIENT_CLINIC_OR_DEPARTMENT_OTHER): Payer: Self-pay | Admitting: Internal Medicine

## 2018-07-25 ENCOUNTER — Ambulatory Visit: Payer: Medicare Other | Attending: Internal Medicine | Admitting: Internal Medicine

## 2018-07-25 ENCOUNTER — Telehealth (HOSPITAL_BASED_OUTPATIENT_CLINIC_OR_DEPARTMENT_OTHER): Payer: Self-pay

## 2018-07-25 ENCOUNTER — Other Ambulatory Visit (HOSPITAL_BASED_OUTPATIENT_CLINIC_OR_DEPARTMENT_OTHER): Payer: Self-pay

## 2018-07-25 VITALS — BP 118/71 | HR 87 | Ht 60.83 in | Wt 220.0 lb

## 2018-07-25 DIAGNOSIS — K209 Esophagitis, unspecified without bleeding: Secondary | ICD-10-CM

## 2018-07-25 DIAGNOSIS — R109 Unspecified abdominal pain: Secondary | ICD-10-CM | POA: Diagnosis not present

## 2018-07-25 DIAGNOSIS — K2950 Unspecified chronic gastritis without bleeding: Secondary | ICD-10-CM | POA: Diagnosis present

## 2018-07-25 DIAGNOSIS — Z1231 Encounter for screening mammogram for malignant neoplasm of breast: Secondary | ICD-10-CM

## 2018-07-25 MED ORDER — DICYCLOMINE HCL 10 MG PO CAPS: 10 mg | capsule | Freq: Four times a day (QID) | ORAL | 1 refills | 0 days | Status: AC

## 2018-07-25 MED ORDER — DICYCLOMINE HCL 10 MG PO CAPS
10.0000 mg | ORAL_CAPSULE | Freq: Four times a day (QID) | ORAL | 1 refills | Status: DC
Start: 2018-07-25 — End: 2018-12-04

## 2018-07-25 NOTE — Progress Notes (Signed)
CCC placed TC to Pt on this date.  Pt answered, but requested a call back on 9/9.      [facility]  Phone: [phone]  [email or web address]

## 2018-07-28 ENCOUNTER — Ambulatory Visit
Admission: RE | Admit: 2018-07-28 | Discharge: 2018-07-28 | Disposition: A | Discharge status: Home / Self Care | Payer: Medicare Other | Attending: Internal Medicine | Admitting: Internal Medicine

## 2018-07-28 ENCOUNTER — Other Ambulatory Visit (HOSPITAL_BASED_OUTPATIENT_CLINIC_OR_DEPARTMENT_OTHER): Payer: Self-pay | Admitting: Internal Medicine

## 2018-07-28 DIAGNOSIS — R109 Unspecified abdominal pain: Secondary | ICD-10-CM

## 2018-07-28 DIAGNOSIS — K760 Fatty (change of) liver, not elsewhere classified: Secondary | ICD-10-CM | POA: Diagnosis not present

## 2018-07-28 DIAGNOSIS — R9389 Abnormal findings on diagnostic imaging of other specified body structures: Secondary | ICD-10-CM | POA: Diagnosis not present

## 2018-07-28 DIAGNOSIS — R102 Pelvic and perineal pain: Secondary | ICD-10-CM

## 2018-07-28 DIAGNOSIS — D259 Leiomyoma of uterus, unspecified: Secondary | ICD-10-CM

## 2018-07-28 NOTE — Progress Notes (Signed)
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) ([place])     Tobacco use disorder     Spinal stenosis of lumbar region     HLD (hyperlipidemia)     Chronic bilateral low back pain with bilateral sciatica     Skin change     Pineal gland cyst     History of vitamin D deficiency     Hemorrhoids     Venous (peripheral) insufficiency     Adrenal adenoma     Type 2 diabetes mellitus with microalbuminuria, without long-term current use of insulin (HCC)     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 (HCC)     Acute pain of right wrist     Abnormal PFTs     Screening for malignant neoplasm of breast     Skin irritation     H/O bone density study     Vertigo     Orthostatic hypotension     Social problem     Right arm weakness     Insomnia     Acute pain of left knee     Hx of total knee replacement, left     Acute bacterial sinusitis     Seasonal allergic rhinitis     Left foot pain     Stress due to family tension     Gastritis without bleeding     Leucocytosis     Gastroesophageal reflux disease without esophagitis      HOSP-TO-HOME COMMUNITY COLLABORATION PROGRAM     Epigastric pain     Hx of gastritis     Common bile duct dilation     Fatty liver     Pancreatic lesion     Vascular calcification     Umbilical hernia without obstruction and without gangrene     Arthritis of spine     Scoliosis of thoracic spine     Anxiety     Sleep disturbance     Esophagitis     First degree AV block     Obstructive sleep apnea     Unintentional weight loss    Seen again in ED 9/1 for abdominal pain  Given IVF, zofran, lidocaine, maalox, pepcid, tylenol, carafat  Felt improved  Rx for bentyl - thinks helped some    Has pH study coing up in October at [facility] - 09/02/18  Ongoing pain, reports had another "attack" last night - did not present to ED  Known gastritis, esophagitis  Has  therapy appt upcoming 9/12 for panic, anxiety  Has gynecology appt 10/10, would like [place] to further eval pain  Has psychiatry appt 10/23  Next GI appt 11/5    Patient reports worried she will not be able to stop her acid medications as requested prior to pH study    Most Recent BP Reading(s)  07/25/18 : 118/71  07/20/18 : 125/85  07/16/18 : 111/62  07/11/18 : 115/73  07/06/18 : 140/75    Most Recent Weight Reading(s)  07/25/18 : 99.8 kg (220 lb)  07/20/18 : 99.8 kg (220 lb)  07/16/18 : 99.8 kg (220 lb)  07/11/18 : 98.9 kg (218 lb)  07/06/18 : 102.5 kg (226 lb)    PE:  BP 118/71  Pulse 87  Ht 5' 0.83" (1.545 m)  Wt 99.8 kg (220 lb)  LMP 10/22/2007 (LMP Unknown)  SpO2 97%  BMI 41.81 kg/m2  Estimated body mass index is 41.81 kg/m as calculated from the following:    Height as of this encounter: 5' 0.83" (1.545 m).    Weight as of this encounter: 99.8 kg (220 lb).  Gen - Morbidly obese middle aged female upright in chair in NAD  HEENT - MMM  CV - RRR, no m/r/g  Resp - CTAB  Abd - +BS, soft, ND, diffuse TTP in epigastric region but also across central mid abdomen.  No rebound or guarding, no CVAT  Neuro - A+Ox3  Psych - Anxious at times    A/P:  (R10.9) Abdominal pain, unspecified abdominal location  (primary encounter diagnosis)  (K20.9) Esophagitis  (K29.50) Chronic gastritis without bleeding, unspecified gastritis type  Comment: Continue bentyl trial - refilled, continue current GI meds.  Given concern for lower abdominal pain - US pelvis at patient request.  Given concern about stopping meds pre pH study - encouraged to make plan with GI team  Plan: dicyclomine (BENTYL) 10 MG capsule, US PELVIS NON-PREGNANT

## 2018-07-29 ENCOUNTER — Encounter (HOSPITAL_BASED_OUTPATIENT_CLINIC_OR_DEPARTMENT_OTHER): Payer: Self-pay | Admitting: Gastroenterology

## 2018-07-29 NOTE — Progress Notes (Signed)
Been in contact with PCP.     Currently on Zantac 150 mg and carafate.     [facility] told the patient to stop the Zantac 1 day before the procedure.     Currently off omeprazole

## 2018-07-30 ENCOUNTER — Encounter (HOSPITAL_BASED_OUTPATIENT_CLINIC_OR_DEPARTMENT_OTHER): Payer: Medicare Other | Admitting: Internal Medicine

## 2018-07-30 ENCOUNTER — Encounter (HOSPITAL_BASED_OUTPATIENT_CLINIC_OR_DEPARTMENT_OTHER): Payer: Self-pay

## 2018-07-30 ENCOUNTER — Emergency Department
Admission: EM | Admit: 2018-07-30 | Discharge: 2018-07-31 | Disposition: A | Discharge status: Home / Self Care | Payer: Medicare Other | Source: Intra-hospital | Attending: Emergency Medicine | Admitting: Emergency Medicine

## 2018-07-30 DIAGNOSIS — Z79891 Long term (current) use of opiate analgesic: Secondary | ICD-10-CM | POA: Diagnosis not present

## 2018-07-30 DIAGNOSIS — I10 Essential (primary) hypertension: Secondary | ICD-10-CM | POA: Diagnosis not present

## 2018-07-30 DIAGNOSIS — Z87891 Personal history of nicotine dependence: Secondary | ICD-10-CM | POA: Diagnosis not present

## 2018-07-30 DIAGNOSIS — R110 Nausea: Secondary | ICD-10-CM

## 2018-07-30 DIAGNOSIS — F410 Panic disorder [episodic paroxysmal anxiety] without agoraphobia: Secondary | ICD-10-CM | POA: Diagnosis not present

## 2018-07-30 DIAGNOSIS — M47816 Spondylosis without myelopathy or radiculopathy, lumbar region: Secondary | ICD-10-CM | POA: Diagnosis not present

## 2018-07-30 DIAGNOSIS — R1013 Epigastric pain: Secondary | ICD-10-CM | POA: Diagnosis not present

## 2018-07-30 DIAGNOSIS — M5136 Other intervertebral disc degeneration, lumbar region: Secondary | ICD-10-CM | POA: Diagnosis not present

## 2018-07-30 DIAGNOSIS — R1010 Upper abdominal pain, unspecified: Secondary | ICD-10-CM | POA: Diagnosis not present

## 2018-07-30 LAB — CBC, PLATELET & DIFFERENTIAL
ABSOLUTE BASO COUNT: 0 10*3/uL (ref 0.0–0.1)
ABSOLUTE EOSINOPHIL COUNT: 0.3 10*3/uL (ref 0.0–0.8)
ABSOLUTE IMM GRAN COUNT: 0.02 10*3/uL (ref 0.00–0.03)
ABSOLUTE LYMPH COUNT: 2.6 10*3/uL (ref 0.6–5.9)
ABSOLUTE MONO COUNT: 0.8 10*3/uL (ref 0.2–1.4)
ABSOLUTE NEUTROPHIL COUNT: 5.7 10*3/uL (ref 1.6–8.3)
BASOPHIL %: 0.2 % (ref 0.0–1.2)
EOSINOPHIL %: 3.6 % (ref 0.0–7.0)
HEMATOCRIT: 38.5 % (ref 34.1–44.9)
HEMOGLOBIN: 12.2 g/dL (ref 11.2–15.7)
IMMATURE GRANULOCYTE %: 0.2 % (ref 0.0–0.4)
LYMPHOCYTE %: 27.3 % (ref 15.0–54.0)
MEAN CORP HGB CONC: 31.7 g/dL (ref 31.0–37.0)
MEAN CORPUSCULAR HGB: 28.6 pg (ref 26.0–34.0)
MEAN CORPUSCULAR VOL: 90.2 fL (ref 80.0–100.0)
MEAN PLATELET VOLUME: 11 fL (ref 8.7–12.5)
MONOCYTE %: 8.6 % (ref 4.0–13.0)
NEUTROPHIL %: 60.1 % (ref 40.0–75.0)
PLATELET COUNT: 265 10*3/uL (ref 150–400)
RBC DISTRIBUTION WIDTH STD DEV: 44.7 fL (ref 35.1–46.3)
RBC DISTRIBUTION WIDTH: 13.9 % (ref 11.5–14.3)
RED BLOOD CELL COUNT: 4.27 M/uL (ref 3.90–5.20)
WHITE BLOOD CELL COUNT: 9.4 10*3/uL (ref 4.0–11.0)

## 2018-07-30 LAB — COMPREHENSIVE METABOLIC PANEL
ALANINE AMINOTRANSFERASE: 32 U/L (ref 12–45)
ALBUMIN: 3.7 g/dL (ref 3.4–5.0)
ALKALINE PHOSPHATASE: 92 U/L (ref 45–117)
ANION GAP: 13 mmol/L (ref 5–15)
ASPARTATE AMINOTRANSFERASE: 21 U/L (ref 8–34)
BILIRUBIN TOTAL: 0.2 mg/dL (ref 0.2–1.0)
BUN (UREA NITROGEN): 12 mg/dL (ref 7–18)
CALCIUM: 9.3 mg/dL (ref 8.5–10.1)
CARBON DIOXIDE: 28 mmol/L (ref 21–32)
CHLORIDE: 105 mmol/L (ref 98–107)
CREATININE: 0.7 mg/dL (ref 0.4–1.2)
ESTIMATED GLOMERULAR FILT RATE: >60 mL/min (ref >60)
Glucose Random: 98 mg/dL (ref 74–160)
POTASSIUM: 3.9 mmol/L (ref 3.5–5.1)
SODIUM: 145 mmol/L (ref 136–145)
TOTAL PROTEIN: 6.9 g/dL (ref 6.4–8.2)

## 2018-07-30 LAB — LIPASE: LIPASE: 170 U/L (ref 73–393)

## 2018-07-30 LAB — MAGNESIUM: MAGNESIUM: 1.7 mg/dL — ABNORMAL LOW (ref 1.8–2.4)

## 2018-07-30 MED ORDER — FAMOTIDINE 20 MG/2ML IV SOLN
20.0000 mg | Freq: Once | INTRAVENOUS | Status: AC
Start: 2018-07-30 — End: 2018-07-30
  Administered 2018-07-30: 20 mg via INTRAVENOUS
  Filled 2018-07-30: qty 2

## 2018-07-30 MED ORDER — ONDANSETRON HCL 4 MG/2ML IJ SOLN
4.0000 mg | Freq: Once | INTRAMUSCULAR | Status: AC
Start: 2018-07-30 — End: 2018-07-30
  Administered 2018-07-30: 4 mg via INTRAVENOUS
  Filled 2018-07-30: qty 2

## 2018-07-30 MED ORDER — SODIUM CHLORIDE 0.9 % IV BOLUS
1000.0000 mL | Freq: Once | INTRAVENOUS | Status: AC
Start: 2018-07-30 — End: 2018-07-30
  Administered 2018-07-30: 1000 mL via INTRAVENOUS

## 2018-07-30 NOTE — ED Provider Notes (Signed)
The patient was seen primarily by me. ED nursing record was reviewed. Select prior records as available electronically through the Epic record were reviewed.        HPI:    Samantha Cameron is a 62 year old female patient with h/o GERD/Gastritis who is presenting with midepigastric abdominal pain.  States that she has been dealing with abdominal pain for a very long time.  States that today the symptoms acutely worsened.  Has burning across the abdomen associated with nausea.  No vomiting.  No urinary symptoms.  No diarrhea.  Normal bowel movements.  No vaginal bleeding or discharge.  No chest pain or shortness of breath.  She has been taking her prescribed medications that include Maalox, lidocaine, Carafate, Zantac and Bentyl.     ROS: Pertinent positives were reviewed as per the HPI above. All other systems were reviewed and are negative.  [name]  Language of care: English  MRN: [id number]  PCP: Driscilla Grammes, MD  Mode of arrival to ED: Self.  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) ([place])  09/14/2015: Cough  No date: Depression      Comment:  per steward records  No date: Diabetes  No date: Disorders of lipoid metabolism  No date: Esophageal reflux  No date: External  hemorrhoid      Comment:  per steward records  No date: HTN (hypertension)  02/01/2012: Hypoxia  02/16/2017: Left ear pain  06/07/2015: Left genital labial abscess  06/16/2015: Obesity, Class III, 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y) ([place])  08/16/2015: Osteopenia      Comment:  On xray of left knee per steward records 06/2014 Unclear                if addressed  05/23/2018: Pancreatic lesion  09/14/2015: Right ear impacted cerumen  08/15/2015: Type 2 diabetes mellitus without complication, without   long-term current use of insulin (HCC)      Comment:   HEMOGLOBIN A1C (%) Date Value 07/15/2015 6.2 (H)                ----------  Steward record - a1c 6.2 on 01/31/15 6.4 on                09/30/14 5.9 on 12/29/13 6.4 08/18/13   11/18/2017: Viral URI with cough  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) ([place])     Tobacco use disorder     Spinal stenosis  of lumbar region     HLD (hyperlipidemia)     Chronic bilateral low back pain with bilateral sciatica     Skin change     Pineal gland cyst     History of vitamin D deficiency     Hemorrhoids     Venous (peripheral) insufficiency     Adrenal adenoma     Type 2 diabetes mellitus with microalbuminuria, without long-term current use of insulin (HCC)     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 (HCC)     Acute pain of right wrist     Abnormal PFTs     Screening for malignant neoplasm of breast     Skin irritation     H/O bone density study     Vertigo     Orthostatic hypotension     Social problem     Right arm weakness     Insomnia     Acute pain of left knee     Hx of total knee replacement, left     Acute bacterial sinusitis     Seasonal allergic rhinitis     Left foot pain     Stress due to family tension     Gastritis without bleeding     Leucocytosis     Gastroesophageal reflux disease without esophagitis      HOSP-TO-HOME COMMUNITY COLLABORATION PROGRAM     Epigastric pain     Hx of gastritis     Common bile duct  dilation     Fatty liver     Pancreatic lesion     Vascular calcification     Umbilical hernia without obstruction and without gangrene     Arthritis of spine     Scoliosis of thoracic spine     Anxiety     Sleep disturbance     Esophagitis     First degree AV block     Obstructive sleep apnea     Unintentional weight loss     Abdominal pain    Past Surgical History: Past Surgical History:  No date: ANES NERVE MUSC TENDON FASCIA&BURSA KNEE&/POPLT  No date: FOOT SURGERY      Comment:  bilateral  No date: LAPAROSCOPY SURG CHOLECYSTECTOMY  No date: OB ANTEPARTUM CARE CESAREAN DLVR & POSTPARTUM      Comment:  x3  No date: TONSILLECTOMY & ADENOIDECTOMY <AGE 12  04/13/2009: TOTAL KNEE REPLACEMENT      Comment:  left  08/11/2009: WRIST GANGLION EXCISION      Comment:  left   Social History:   Social History     Socioeconomic History    Marital status: Divorced     Spouse name: Not on file    Number of children: Not on file    Years of education: Not on file    Highest education level: Not on file   Occupational History    Not on file   Social Needs    Financial resource strain: Not on file    Food insecurity:     Worry: Not on file     Inability: Not on file    Transportation needs:     Medical: Not on file     Non-medical: Not on file   Tobacco Use    Smoking status: Former Smoker     Packs/day: 0.50     Years: 31.00     Pack years: 15.50     Types: Cigarettes     Last attempt  to quit: 03/10/2018     Years since quitting: 0.3    Smokeless tobacco: Never Used    Tobacco comment: quit smoking inform provided to patient   Substance and Sexual Activity    Alcohol use: No    Drug use: No    Sexual activity: Not Currently     Partners: Male     Birth control/protection: Tubal Ligation   Lifestyle    Physical activity:     Days per week: Not on file     Minutes per session: Not on file    Stress: Not on file   Relationships    Social connections:     Talks on phone: Not on file     Gets together: Not on file      Attends religious service: Not on file     Active member of club or organization: Not on file     Attends meetings of clubs or organizations: Not on file     Relationship status: Not on file    Intimate partner violence:     Fear of current or ex partner: Not on file     Emotionally abused: Not on file     Physically abused: Not on file     Forced sexual activity: Not on file   Other Topics Concern    Not on file   Social History Narrative    Lives with son    1 daughter and another son passed away    Disabled from back    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 Simons,MD, 07/15/2015, 2:56 PM           Allergies: Review of Patient's Allergies indicates:   Codeine camsylate       Shortness of Breath, Other (See                            Comments)    Comment:Chest pain   Motrin [ibuprofen]      Nausea Only   Sulfa antibiotics       Other (See Comments)   Bactrim                 Rash, Itching  Immunizations:   Immunization History   Administered Date(s) Administered    HEP B ADULT 3 DOSE 20 and > 08/15/2015, 09/14/2015, 05/17/2016    INFLUENZA VIRUS TRI W/PRESV VACCINE 18/> YRS IM (PRIVATE) 10/14/2007, 09/17/2008, 09/01/2009, 08/07/2010, 08/14/2011, 09/03/2011, 09/12/2012, 08/18/2013, 08/24/2014, 06/30/2015    Influenza Virus Quad Presv Free Vacc 6 Mo and Older, IM 06/30/2015, 12/11/2016, 07/02/2017    Influenza Virus Quadrivalent Vacc 3/> Yrs Im 06/30/2015, 07/02/2017, 07/01/2018    Influenza Virus Tri Presv Free 3/> YRS IM 07/03/2016    PNEUMOCOCCAL POLYSACCHARIDE VACCINE v23 03/12/2014, 08/15/2015, 12/01/2015    Pneumococcal Vaccine, Conjugate V7 01/31/2009    Td 11/01/2001    Tdap 09/17/2008, 12/11/2016    ZOSTER SHINGLES VACC, LIVE SC 08/24/2008, 12/01/2015    Zoster Vacc (HZV) Recomb Adjv,  IM, 2 Dose, ([place]) 05/30/2017, 07/17/2017          Medications:  Prior to Admission Medications   Prescriptions  Last Dose Informant Patient Reported? Taking?   ONETOUCH DELICA LANCETS FINE MISC   No No   Sig: TEST once daily   Omega-3 Fatty Acids (FISH OIL) 1000 MG CAPS   Yes No   Sig: Take by mouth   SM ANTACID ADVANCED 200-200-20 MG/5ML suspension   No No   Sig: TAKE 30ML BY MOUTH EVERY 6 (SIX) HOURS AS NEEDED FOR INDIGESTION   albuterol (PROVENTIL HFA,VENTOLIN HFA, PROAIR HFA) 108 (90 Base) MCG/ACT inhaler   No No   Sig: Inhale 2 puffs into the lungs every 6 (six) hours as needed for Wheezing or Shortness of breath   albuterol (PROVENTIL) (2.5 MG/3ML) 0.083% nebulizer solution   No No   Sig: Take 3 mLs by nebulization every 6 (six) hours as needed for Wheezing   aspirin 81 MG tablet   Yes No   Sig: Take 81 mg by mouth daily.   atorvastatin (LIPITOR) 20 MG tablet   No No   Sig: take 1 tablet by mouth once daily   cholecalciferol (VITAMIN D3) 1000 UNIT tablet   Yes No   Sig: Take 1 tablet by mouth daily   dicyclomine (BENTYL) 10 MG capsule   No No   Sig: Take 1 capsule by mouth 4 (four) times daily before meals and nightly   fluticasone (FLONASE) 50 MCG/ACT nasal spray   No No   Sig: 1 spray by Each Nostril route daily   lidocaine (XYLOCAINE) 2 % solution   No No   Sig: Take 10 mLs by mouth every 8 (eight) hours as needed for Pain   lisinopril (PRINIVIL,ZESTRIL) 20 MG tablet   No No   Sig: take 1 tablet by mouth once daily   metFORMIN (GLUCOPHAGE-XR) 500 MG 24 hr tablet   No No   Sig: take 4 tablets by mouth once daily WITH BREAKFAST   metoprolol (TOPROL-XL) 25 MG 24 hr tablet   No No   Sig: Take 1 tablet by mouth daily   oxycodone-acetaminophen (PERCOCET) 5-325 MG per tablet   Yes No   Sig: Take 1 tablet by mouth 2 (two) times daily as needed With Dr Dwyane Dee   pregabalin (LYRICA) 100 MG capsule   Yes No   Sig: Take 100 mg by mouth 2 (two) times daily With Dr Dwyane Dee   raNITIdine (ZANTAC) 150 MG tablet   No No    Sig: Take 1 tablet by mouth 2 (two) times daily   sucralfate (CARAFATE) 1 GM/10ML suspension   No No   Sig: Take 10 mLs by mouth 4 (four) times daily      Facility-Administered Medications: None     Physical Exam:   Patient Vitals for the past 99 hrs:   BP Temp Pulse Resp SpO2 Weight   07/30/18 2346 -- -- 72 -- 98 % --   07/30/18 2322 -- -- 74 -- 96 % --   07/30/18 2106 111/82 98.1 F 84 16 96 % 99.8 kg (220 lb)     GENERAL:  WDWN, no acute distress, non-toxic   SKIN:  Warm & Dry, no rash, no petechia.  HEAD:  NCAT. Sclerae are anicteric and aninjected, oropharynx is clear with moist mucous membranes. PERRL. EOMI. B TMs clear.  NECK:  Supple  LUNGS:  Clear to auscultation bilaterally. No wheezes, rales, rhonchi.   HEART:  RRR.  No murmurs, rubs, or gallops.   ABDOMEN:  Soft, mild to moderate midepigastric tenderness.  No masses.  No involuntary guarding or rebound.  EXTREMITIES:  No obvious deformities.  Warm and well perfused.  No cyanosis, no edema.  NEUROLOGIC:  Alert; moves all extremities; speaking in sentences. Normal gait without ataxia.Marland Kitchen  PSYCHIATRIC:  Appropriate for age, time of day, and situation    Medications Given in the ED:  Medications   sodium chloride 0.9 % IV bolus 1,000 mL (0 mLs Intravenous Stopped 07/30/18 2337)   ondansetron (ZOFRAN) injection 4 mg (4 mg Intravenous Given 07/30/18 2218)   famotidine (PEPCID) injection 20 mg (20 mg Intravenous Given 07/30/18 2219)    Radiology Results:     Lab Results (abnormal results only):  Labs Reviewed   MAGNESIUM - Abnormal; Notable for the following components:       Result Value    MAGNESIUM 1.7 (*)     All other components within normal limits   CBC, PLATELET & DIFFERENTIAL   COMPREHENSIVE METABOLIC PANEL   LIPASE    Other Results/Old Record review (e.g. ECG):       ED Course and Medical Decision-making:  62 year old female patient is presenting with abdominal pain.  On exam the patient is afebrile, hemodynamically stable with a nonfocal neuro exam.   She does have some mild to moderate tenderness to palpation in the midepigastrium.    Lab work was obtained and unremarkable.  She was treated symptomatically with IV fluids, Zofran and Pepcid with improvement in symptoms.    She did inquire about an ultrasound that was performed a few days ago.  This was reviewed and notable for fibroids and thickened endometrium.  She was informed of these findings and instructed to follow-up with her OB/GYN.  She states that she does have an appointment with them.     Encouraged to follow-up with her PCP closely.  Return precautions were discussed.    Patient/family educated on diagnosis(es); she states understanding and agrees with plan of care.  Reasons to return to the ED were reviewed in detail. She agrees with this plan and disposition.    Condition on Discharge: Improved and Stable    Diagnosis/Diagnoses:  Pain of upper abdomen    Angus Palms, MD  This Emergency Department patient encounter note was created using voice-recognition software and in real time during the ED visit.

## 2018-07-30 NOTE — Narrator Note (Signed)
Pt reports feeling better after med admin

## 2018-07-30 NOTE — ED Triage Note (Signed)
Pt self presents to ED complaining of chronic GERD pain, burning across abdomen and nausea.  Denies vomiting, diarrhea, CP, and SOB.  Takes maalox, lidocaine, carafate, zantac and bentyl for GERD.  Skin w/d appropriate for ethnicity.  In no apparent distress, speaking in full sentences.

## 2018-07-31 ENCOUNTER — Other Ambulatory Visit (HOSPITAL_BASED_OUTPATIENT_CLINIC_OR_DEPARTMENT_OTHER): Payer: Self-pay | Admitting: Registered Nurse

## 2018-07-31 ENCOUNTER — Ambulatory Visit (HOSPITAL_BASED_OUTPATIENT_CLINIC_OR_DEPARTMENT_OTHER): Payer: Medicare Other

## 2018-07-31 ENCOUNTER — Encounter (HOSPITAL_BASED_OUTPATIENT_CLINIC_OR_DEPARTMENT_OTHER): Payer: Self-pay | Admitting: Registered Nurse

## 2018-07-31 DIAGNOSIS — F410 Panic disorder [episodic paroxysmal anxiety] without agoraphobia: Secondary | ICD-10-CM | POA: Diagnosis not present

## 2018-07-31 DIAGNOSIS — Z87891 Personal history of nicotine dependence: Secondary | ICD-10-CM | POA: Diagnosis not present

## 2018-07-31 DIAGNOSIS — R1013 Epigastric pain: Secondary | ICD-10-CM | POA: Diagnosis not present

## 2018-07-31 NOTE — Discharge Instructions (Addendum)
Evaluated today for abdominal pain.     Your labwork did not show a cause for your symptoms.     Read all provided instructions. Follow up with your PCP within 1 week. Seek medical attention if you develop new or worsening symptoms or if you have any other concerns.

## 2018-07-31 NOTE — Narrator Note (Signed)
Patient Disposition    Patient education for diagnosis, medications, activity, diet and follow-up.  Patient left ED 12:13 AM.  Patient rep received written instructions.  Interpreter to provide instructions: No    Patient belongings with patient: YES    Have all existing LDAs been addressed? Yes    Have all IV infusions been stopped? Yes    Discharged to: Discharged to home.  Pt provided with dc instructions, teaching and follow up care.  Skin w/d appropriate for ethnicity.  IN no apparent distress, speaking in full sentences.

## 2018-07-31 NOTE — Progress Notes (Signed)
Pt referred to CCM by [facility], reason for referral: 8+ ED visits, abdominal pain, Numerous Specialty Care Visits    Chart review done - CCM Triage Score:   17  (15+ meets criteria for CCM enrollment)     Pt is a 62 year old female with Increased Utilization, Multiple ED Visits,      Risk of Admission or ED Visit  Current as of today      82% 40 - 100%: High Risk   20 - 40%: Medium Risk   0 - 20%: Low Risk     The previous score was 79 on 05/13/2018.:  Last Change:       This score indicates an adult patient's 1-year risk, as a percentage, of a hospital admission or ED visit.                       Assessment:    Patient meets criteria for LTSS or CCM.  Patient assigned to Vivien Rota to begin outreach. Date added to Care Team:07/31/18     Additional Recommendations or Comments:Pt has had 15 ED visits in the past year

## 2018-08-02 ENCOUNTER — Telehealth (HOSPITAL_BASED_OUTPATIENT_CLINIC_OR_DEPARTMENT_OTHER): Payer: Self-pay | Admitting: Internal Medicine

## 2018-08-02 DIAGNOSIS — K2950 Unspecified chronic gastritis without bleeding: Secondary | ICD-10-CM

## 2018-08-02 LAB — EKG

## 2018-08-02 MED ORDER — LIDOCAINE VISCOUS HCL 2 % MT SOLN
10.0000 mL | Freq: Three times a day (TID) | OROMUCOSAL | 1 refills | Status: AC | PRN
Start: 2018-08-02 — End: 2018-10-01

## 2018-08-02 MED ORDER — LIDOCAINE VISCOUS HCL 2 % MT SOLN: 10 mL | mL | Freq: Three times a day (TID) | OROMUCOSAL | 1 refills | 0 days | Status: AC | PRN

## 2018-08-02 NOTE — Progress Notes (Signed)
On call note    Patient paged me Saturday morning regarding chest burning  On chart review, looks like she's had multiple ED visits recently. Most recent was a few days ago for abdominal pain, thought to be GERD related.  Looks like she was also recently referred to [facility] for her frequent ED visits.     I called patient back.   She stated that she's having really severe GERD. I've been on everything, I was just in hospital for this.  She says it's burning in the middle of the chest, and chest hurts when she presses on it.  Already tried zantac and carafate today, not helping.   She hasn't taken the lidocaine yet today, but needs more soon. It does help when she takes it. She also hasn't tried the antacid today yet,   Burning in her chest is worse than in the past, going all the way up to her throat.   She stopped the omeprazole, Dr. Trinda Pascal had told her to stop. She has an upcoming procedure at [facility] in October, (looks like pH study) so I wonder if that's why omeprazole was held. They are considering surgical management.       She is trying not to go to the ED because she has been there so much recently.  She will try the lidocaine/antacid together. I'm refilling lidocaine for her too  If not helping, will go to ED for likely IV treatment.

## 2018-08-04 ENCOUNTER — Encounter (HOSPITAL_BASED_OUTPATIENT_CLINIC_OR_DEPARTMENT_OTHER): Payer: Self-pay | Admitting: Physician Assistant

## 2018-08-04 DIAGNOSIS — D219 Benign neoplasm of connective and other soft tissue, unspecified: Secondary | ICD-10-CM | POA: Insufficient documentation

## 2018-08-04 DIAGNOSIS — R9389 Abnormal findings on diagnostic imaging of other specified body structures: Secondary | ICD-10-CM | POA: Insufficient documentation

## 2018-08-04 NOTE — Progress Notes (Signed)
OUTPATIENT PSYCHIATRY PROGRESS NOTE    INTERPRETER: No    CONTACT INFO FOR OTHER AGENCIES AND MENTAL HEALTH PROVIDERS (IF APPLICABLE): N/A    PROBLEM(S) ADDRESSED IN THIS SESSION:     1. Physical health issues  2. Anxiety  3. Hx of loss/grief response      SUBJECTIVE  TODAY'S CHIEF COMPLAINT AND CLINICAL UPDATES IN PATIENT'S WORDS:  1) Chief Complaint (Patient and/or guardian's own words, concerns and expressed thoughts):     "I went to the ER last night. I was having another attack. I was in so much pain and my stomach was burning. I know something is wrong and it's not just the GERD."    "They don't listen to me at the ER. They make me feel like an addict there. I'm going to complain."    2) New information from patient and/or collateral (Patient's illness: context, course, modifying factors, severity, cultural, family, social, medical history): pt endorses previous dx of GERD, spinal stenosis, sciatica, and anxiety. Pt reports that she struggles w/acid reflux that leads to panic attacks.     OBJECTIVE  DATA REVIEWED (Consider medical labs, radiology, other medical tests; screening/outcome measures; psychological testing; discussion of test results with other clinicians; consultation with other clinicians and systems involved with patient, summary of old records): previous note    CURRENT MEDICATIONS (make clear medications prescribed by psychiatry; include OTC medications):  Current Outpatient Medications   Medication Sig   . lidocaine (XYLOCAINE) 2 % solution Take 10 mLs by mouth every 8 (eight) hours as needed for Pain   . dicyclomine (BENTYL) 10 MG capsule Take 1 capsule by mouth 4 (four) times daily before meals and nightly   . raNITIdine (ZANTAC) 150 MG tablet Take 1 tablet by mouth 2 (two) times daily   . sucralfate (CARAFATE) 1 GM/10ML suspension Take 10 mLs by mouth 4 (four) times daily   . SM ANTACID ADVANCED 200-200-20 MG/5ML suspension TAKE 30ML BY MOUTH EVERY 6 (SIX) HOURS AS NEEDED FOR INDIGESTION    . fluticasone (FLONASE) 50 MCG/ACT nasal spray 1 spray by Each Nostril route daily   . albuterol (PROVENTIL) (2.5 MG/3ML) 0.083% nebulizer solution Take 3 mLs by nebulization every 6 (six) hours as needed for Wheezing   . metoprolol (TOPROL-XL) 25 MG 24 hr tablet Take 1 tablet by mouth daily   . lisinopril (PRINIVIL,ZESTRIL) 20 MG tablet take 1 tablet by mouth once daily   . ONETOUCH DELICA LANCETS FINE MISC TEST once daily   . metFORMIN (GLUCOPHAGE-XR) 500 MG 24 hr tablet take 4 tablets by mouth once daily WITH BREAKFAST   . atorvastatin (LIPITOR) 20 MG tablet take 1 tablet by mouth once daily   . albuterol (PROVENTIL HFA,VENTOLIN HFA, PROAIR HFA) 108 (90 Base) MCG/ACT inhaler Inhale 2 puffs into the lungs every 6 (six) hours as needed for Wheezing or Shortness of breath   . Omega-3 Fatty Acids (FISH OIL) 1000 MG CAPS Take by mouth   . cholecalciferol (VITAMIN D3) 1000 UNIT tablet Take 1 tablet by mouth daily   . pregabalin (LYRICA) 100 MG capsule Take 100 mg by mouth 2 (two) times daily With Dr Dwyane Dee   . oxycodone-acetaminophen (PERCOCET) 5-325 MG per tablet Take 1 tablet by mouth 2 (two) times daily as needed With Dr Dwyane Dee   . aspirin 81 MG tablet Take 81 mg by mouth daily.     No current facility-administered medications for this visit.        MEDICATION ADHERENCE (including barriers and  how addressed):Yes    MEDICATION SIDE EFFECTS (Prescribers Only): N/A    BIRTH CONTROL (ask females and males): N/A    CURRENT PREGNANCY: N/A      MENTAL STATUS EXAMINATION       Appearance: Appears stated age, well groomed  Behavior: WNL  Alertness:  WNL  Speech:  WNL  Mood: "I'm tired of all of this"  Affect:  Frustrated, congruent  Thought Process: WNL  Thought Content:  Past history, medical problems  Perceptions:  None  Judgment/Impulse Control: Good    Insight:  Fair   Cognition: Good  Suicidal/Homicidal: Denies SI/HI     ASSESSMENT   Today's Assessment:  -Pt presented at [facility] ED last night after having a panic attack  and GI pain  -Pt seems to continue to somaticize anxiety and depression  -Pt stressed that she is suffering from severe insomnia  -Pt states that she feels her concerns about her GERD aren't taking seriously and that she is, "made to feel like an addict" with repeated trips to ED         Risk Level Assessment  Risk Level Change (if yes, please describe): No    Suicide: low (1)  Violence: low (1)  Addiction: low        Protective Factors: some supportive family, stable housing    DIAGNOSES ASSESSED TODAY (psychiatric diagnoses and medical diagnoses that factor into management of psychiatric treatment): panic d/o    CLINICAL FORMULATION (Make changes as your understanding changes. Should coincide with treatment plan):      Pt is a 62yo divorced woman, current guardian of 28yo granddaughter, of [place] descent, appearing in treatment with medical compliance and complaints of insomnia and acid reflux that results in SOB and panic attacks. Pt states that she was told by her doctor that she should see a therapist. Pt states that frequent acid reflux and panic have led to multiple trips to the ED. Pt reports feeling that people in the ED and her doctor are not taking her concerns seriously, and she appears to not have much insight around how anxiety symptoms may be contributing to her ED visits. Pt also has a history of the traumatic loss of her son due to an overdose. Pt may benefit from therapy to learn methods of managing anxiety and panic symptoms, to reduce ED visits and insomnia.      REVIEWING TODAY'S VISIT  CLINICAL INTERVENTIONS TODAY:  Continued to work on Designer, television/film set; explored physical health issues and how anxiety can manifest in different ways in the body; validated frustrations w/medical providers    PATIENT'S RESPONSE TO INTERVENTIONS: pt was well-engaged, supportive of the process, and receptive to this writer's interventions mostly. Pt demonstrates difficulty understanding how acid reflux and  insomnia are related to anxiety and grief    PROGRESS TOWARDS GOALS: ongoing    TIME SPENT IN PSYCHOTHERAPY: 45 min    [place] (Consider risk plan for patients at moderate or high risk for suicide/violence/addiction; medication plan; referrals, etc. Must coincide with treatment plan.): N/A    PLAN FOR ONGOING TREATMENT:  Individual therapy, meeting e/o week    INFORMED CONSENT (for any new treatment): N/A      ONLY FOR PRESCRIBERS DOING EVALUATION AND MANAGEMENT VISITS     Use only when no psychotherapy performed  COUNSELING AND COORDINATION OF CARE PROVIDED (Consider diagnostic results/impressions and/or recommended studies; risks and benefits of treatment options; instruction for management/treatment and/or follow-up; importance of compliance  with chosen treatment options; risk factor reduction; patient/family/caregiver education; prognosis): N/A      Over 50% of time during today's visit was devoted to counseling and/or coordination of care.  If yes, record estimated duration of the face to face encounter.  N/A    INSTRUCTIONS TO COVERING CLINICIANS:  N/A      .Marland Kitchen

## 2018-08-06 DIAGNOSIS — M47816 Spondylosis without myelopathy or radiculopathy, lumbar region: Secondary | ICD-10-CM | POA: Diagnosis not present

## 2018-08-06 DIAGNOSIS — Z79891 Long term (current) use of opiate analgesic: Secondary | ICD-10-CM | POA: Diagnosis not present

## 2018-08-06 DIAGNOSIS — M5416 Radiculopathy, lumbar region: Secondary | ICD-10-CM | POA: Diagnosis not present

## 2018-08-07 ENCOUNTER — Ambulatory Visit: Payer: Medicare Other | Attending: Obstetrics & Gynecology

## 2018-08-07 ENCOUNTER — Encounter (HOSPITAL_BASED_OUTPATIENT_CLINIC_OR_DEPARTMENT_OTHER): Payer: Self-pay

## 2018-08-07 VITALS — BP 97/67 | HR 91 | Ht 60.8 in | Wt 214.4 lb

## 2018-08-07 DIAGNOSIS — R9389 Abnormal findings on diagnostic imaging of other specified body structures: Secondary | ICD-10-CM | POA: Insufficient documentation

## 2018-08-07 DIAGNOSIS — Z124 Encounter for screening for malignant neoplasm of cervix: Secondary | ICD-10-CM | POA: Diagnosis not present

## 2018-08-07 DIAGNOSIS — D219 Benign neoplasm of connective and other soft tissue, unspecified: Secondary | ICD-10-CM

## 2018-08-07 NOTE — Progress Notes (Signed)
SUBJECTIVE  Samantha Cameron is a 62 year old female PMH DMII, COPD, morbid obesity, GERD, presents for ED follow up. She has had multiple ED visits over the past several months with c/o diffuse lower abdominal pain and has been followed for chronic gastritis, esophagitis, GERD. PCP ordered pelvic US 9/6 given patient was endorsing lower abdominal pain.   [place] with findings of two 1.5 cm fibroids and a thickened endometrium to 60mm, referred here for evaluation.   Reports last period 10 years ago, no vaginal bleeding since  She has questions about the fibroids and wondering if they are causing her lower abdominal pain.     Pt states she also has an appt 08/2018 for pap smear and would like one done today if possible  Her last pap was 09/2015 NILM HPV negative, and in 2012 NILM     G:4 P:3    ROS:  See HPI  All others reviewed and neg    OBJECTIVE:  BP 97/67 (Site: LA, Position: Sitting, Cuff Size: Lrg)  Pulse 91  Ht 5' 0.8" (1.544 m)  Wt 97.3 kg (214 lb 6.4 oz)  LMP 10/22/2007 (LMP Unknown)  BMI 40.78 kg/m2  Physical Exam   Constitutional: well developed, well nourished female in NAD  PELVIC  External genitalia WNL  Vagina flattened rugae no lesions, no abnormal discharge  Uterus is nontender, normal size, anteverted and mobile    PATIENT/PROCEDURE VERIFICATION DOCUMENTATION    Correct patient: Yes  Correct procedure: Yes  Correct side, site, mark visible if applicable: Yes  Correct position: Yes  Special equipment/implant(s) present, if applicable: Yes    Time-out completed, documented by provider doing procedure or designated team member:  Chaney Born, APRN    08/07/2018    2:01 PM    Risks reviewed and verbal consent given.    Procedure:  Uterus palpated: anteverted, normal size and non-tender  Vagina and cervix prepped with betadine.  Paracervical block administered not done  Tenaculum applied.  Dilation required none  Endometrial sample obtained with Pipelle with two passes  Moderate amount of tissue  obtained.  Cavity 7 cm.  Patient tolerated procedure well.    Post Pain Assessment:  Post pain assessment done. Patient rates pain as a 5 on a 0-10 pain scale.    (R93.89) Endometrial thickening on ultrasound  (primary encounter diagnosis)  Disc finding on [place]. Pt without any bleeding sxs. Reviewed importance to r/o hyperplasia/malignancy, pt agreed to EMB today, sample obtained and sent to path.   Plan: CHAPERONE ACCEPTED FOR EXAM, ENDOMETRIAL BX         W/WO ENDOCERVIX BX W/O DILAT SPX, SURGICAL PATH        SPECIMEN GYN        Will f/up results    (D21.9) Fibroids  Reviewed findings on ultrasound. Disc fibroids of this size and location are not cause of her moderate to severe diffuse lower abdominal pain. Enc pt to follow up with PCP and GI for pain.  Disc that no intervention is indicated for fibroids at this time.     (Z12.4) Screening for malignant neoplasm of cervix  Our Epic system had patient in for q3 year paps but could not identify abnormal pap to warrant this testing. Disc this with patient, pt still desires screening. Last pap 2016 NILM HPV neg.  Pap done , sent   Plan: CYTOPATH, C/V, THIN LAYER, OBTAINING SCREEN PAP        SMEAR, HUMAN PAPILLOMAVIRUS (HPV)  Will f/up results

## 2018-08-08 ENCOUNTER — Other Ambulatory Visit (HOSPITAL_BASED_OUTPATIENT_CLINIC_OR_DEPARTMENT_OTHER): Payer: Self-pay

## 2018-08-08 ENCOUNTER — Telehealth (HOSPITAL_BASED_OUTPATIENT_CLINIC_OR_DEPARTMENT_OTHER): Payer: Self-pay | Admitting: Registered Nurse

## 2018-08-08 LAB — HUMAN PAPILLOMAVIRUS (HPV): HUMAN PAPILLOMAVIRUS: NEGATIVE

## 2018-08-08 NOTE — Progress Notes (Signed)
Has Fibroids and Cervix is enlarged  Discussed multiple ED visit and what she feels is effective when she goes to the ED.  Pt reports that she gets medicine through an IV and this helps her GERD which has been very bad.  When she has attacks, she feels that her stomach is burning.    Feels she is being treated like an addict in the ED and discussed w/ Dr. [name] that she does not like that.  She plans to file a complaint if it happens again.    Pt reports she is doing pretty well though as she has not been in the ED in 1 month.      Pt verbally consent to [facility] Enrollment  Provider notified.    Next Contact 08/11/18 @  2PM

## 2018-08-08 NOTE — Progress Notes (Signed)
Pt being followed by CCM RN Seabron Spates.      [facility] will disenroll Pt as of this date.     [facility]  Phone: [phone]  [email or web address]

## 2018-08-11 ENCOUNTER — Other Ambulatory Visit (HOSPITAL_BASED_OUTPATIENT_CLINIC_OR_DEPARTMENT_OTHER): Payer: Self-pay | Admitting: Internal Medicine

## 2018-08-11 ENCOUNTER — Other Ambulatory Visit (HOSPITAL_BASED_OUTPATIENT_CLINIC_OR_DEPARTMENT_OTHER): Payer: Self-pay

## 2018-08-11 MED ORDER — GLUCOMETER SYSTEM KIT
PACK | 0 refills | Status: DC
Start: 2018-08-11 — End: 2018-08-11

## 2018-08-11 MED ORDER — GLUCOMETER SYSTEM KIT: each | 0 refills | 0 days | Status: AC

## 2018-08-11 MED ORDER — GLUCOSE BLOOD VI STRP
ORAL_STRIP | 11 refills | Status: AC
Start: 2018-08-11 — End: 2019-08-11

## 2018-08-11 MED ORDER — GLUCOSE BLOOD VI STRP
ORAL_STRIP | Freq: Every day | 11 refills | Status: DC
Start: 2018-08-11 — End: 2022-03-28

## 2018-08-11 MED ORDER — GLUCOMETER SYSTEM KIT
PACK | 0 refills | Status: DC
Start: 2018-08-11 — End: 2020-07-20

## 2018-08-11 MED ORDER — GLUCOSE BLOOD VI STRP: strip | Freq: Every day | 11 refills | 0 days | Status: AC

## 2018-08-11 MED ORDER — GLUCOSE BLOOD VI STRP: strip | 11 refills | 0 days | Status: AC

## 2018-08-11 MED ORDER — GLUCOMETER SYSTEM KIT: each | 0 refills | 0 days | Status: DC

## 2018-08-11 NOTE — Progress Notes (Signed)
PER Pharmacy, Samantha Cameron is a 62 year old female has requested a refill of one touch test strips.      Last Office Visit: 07/25/2018 with pcp  Last Physical Exam: 08/15/2015    COLONOSCOPY due on 09/01/2018  DM EYE EXAM due on 09/12/2018    Diabetic Med:   HEMOGLOBIN A1C (%)   Date Value   05/14/2018 6.3 (H)       No results found for: POCA1C   CREATININE (mg/dL)   Date Value   07/30/2018 0.7       Documented patient preferred pharmacies:    [facility] [place] [place]  Phone: [phone] Fax: [phone]

## 2018-08-11 NOTE — Progress Notes (Signed)
PER Pharmacy, Samantha Cameron is a 62 year old female has requested a refill of atorvastatin.      Last Office Visit: 07/25/2018 with pcp  Last Physical Exam: 08/15/2015    COLONOSCOPY due on 09/01/2018  DM EYE EXAM due on 09/12/2018    Statin Med:  Lipids   Cholesterol (mg/dL)   Date Value   10/28/2017 131     LOW DENSITY LIPOPROTEIN DIRECT (mg/dL)   Date Value   10/28/2017 61     HIGH DENSITY LIPOPROTEIN (mg/dL)   Date Value   10/28/2017 47     TRIGLYCERIDES (mg/dL)   Date Value   10/28/2017 240 (H)   LFTs   ALANINE AMINOTRANSFERASE (U/L)   Date Value   07/30/2018 32       ASPARTATE AMINOTRANSFERASE (U/L)   Date Value   07/30/2018 21       ALBUMIN (g/dL)   Date Value   07/30/2018 3.7       TOTAL PROTEIN (g/dL)   Date Value   07/30/2018 6.9       BILIRUBIN DIRECT (mg/dl)   Date Value   07/06/2018 0.1       BILIRUBIN TOTAL (mg/dL)   Date Value   07/30/2018 0.2       ALKALINE PHOSPHATASE (U/L)   Date Value   07/30/2018 92       Documented patient preferred pharmacies:    [place] [facility] [place], [place]  Phone: [phone] Fax: [phone]

## 2018-08-11 NOTE — Telephone Encounter (Signed)
-----   Message from [facility] sent at 08/11/2018 10:54 AM EDT -----  Regarding: patient needs new glucometer   Patient walked in looking to get information on getting a new glucometer because her machine stop working yesterday . She has checked her sugar . Please return call back to patient [phone]

## 2018-08-11 NOTE — Progress Notes (Signed)
Spoke with patient she reports her glucometer stopped working, reports it is > 64 years old, does not think it needs a battery, requests new meter and strips be sent to pharmacy, tests once a day

## 2018-08-14 ENCOUNTER — Encounter (HOSPITAL_BASED_OUTPATIENT_CLINIC_OR_DEPARTMENT_OTHER): Payer: Self-pay | Admitting: Internal Medicine

## 2018-08-14 ENCOUNTER — Ambulatory Visit (HOSPITAL_BASED_OUTPATIENT_CLINIC_OR_DEPARTMENT_OTHER): Payer: Medicare Other

## 2018-08-14 NOTE — Progress Notes (Signed)
Patient walked in form request for glucometer and strips  Completed [employer] forms   Will be faxed and scanned

## 2018-08-19 ENCOUNTER — Telehealth (HOSPITAL_BASED_OUTPATIENT_CLINIC_OR_DEPARTMENT_OTHER): Payer: Self-pay

## 2018-08-19 NOTE — Progress Notes (Signed)
Spoke to the patient re: ranitidine  Reviewed recall information as outlined on the FDA's website and all questions were answered  Pt would like Dr. Owens Shark to prescribe a similar treatment option and send to [facility] [address] [place]

## 2018-08-19 NOTE — Telephone Encounter (Signed)
-----   Message from [place] sent at 08/19/2018 12:55 PM EDT -----  Regarding: Dr. Laureen Abrahams - Does Zantac cause cancer?  Contact: [phone]  [place] [id number], 62 year old, female, Telephone Information:  Home Phone      [phone]  Work Phone      [phone]  Mobile          [phone]      Patient's Preferred Pharmacy:     [facility] - [address], [place]  Phone: [phone] Fax: [phone]      CONFIRMED TODAY: [name] NUMBER: [phone]  Best time to call back:   Cell phone:   Other phone:    Available times:    Patient's language of care: English    Patient does not need an interpreter.    Patient's PCP: Driscilla Grammes, MD    Person calling on behalf of patient: Patient (self)    Calls today, concerned. Patient saw on the news, that Zantac causes cancer. Patient takes Zantac.

## 2018-08-20 NOTE — Progress Notes (Signed)
Call placed to pt. She has stopped her Zantac because of the recall information.                   Samantha Cameron  Female, 62 year old, August 03, 1956  MRN:   [id number]  Phone:   [phone] (H) ...  PCP:   Suann Larry. [name], MD  Primary Cvg:   [place] [facility]  Next Appt  With Medical Specialties Izora Gala, MD)  09/23/2018 at 1:00 PM  Dr Owens Shark   Received: Today   Message Contents   [name]   Phone Number: [phone]            [place] [id number], 62 year old, female, Telephone Information:   Home Phone   [phone]   Work Phone   [phone]   Mobile     [phone]       Patient's Preferred Pharmacy:     [facility] - [address], [place]   Phone: [phone] Fax: [phone]       CONFIRMED TODAY: Samantha Cameron NUMBER: [phone]   Best time to call back: any   Cell phone:   Other phone:     Available times:     Patient's language of care: English     Patient does not need an interpreter.     Patient's PCP: Driscilla Grammes, MD     Person calling on behalf of patient: Patient (self)     Calls today to speak to provider only.     Pt is feeling very sick with burning sensation in stomach and would like to know if Dr Owens Shark will prescribe another medication. Please call back, thank you

## 2018-08-21 MED ORDER — FAMOTIDINE 20 MG PO TABS: 20 mg | tablet | Freq: Two times a day (BID) | ORAL | 1 refills | 0 days | Status: AC

## 2018-08-21 MED ORDER — FAMOTIDINE 20 MG PO TABS
20.0000 mg | ORAL_TABLET | Freq: Two times a day (BID) | ORAL | 1 refills | Status: DC
Start: 2018-08-21 — End: 2018-11-06

## 2018-08-21 NOTE — Progress Notes (Signed)
Samantha Cameron  Female, 62 year old, [birth year]  MRN:   [id number]  Phone:   [phone] (H) ...  PCP:   Suann Larry. [name], MD  Coverage:   [facility] Izora Gala, MD)  09/23/2018 at 1:00 PM   Message   Received: Today   Message Contents   Samantha Spearing, PA-C  Delight Stare, RN   Caller: Unspecified (2 days ago, 3:07 PM)            Hi Samantha Cameron,     Please let the patient know that a prescription for famotidine 20 mg twice daily was sent to her pharmacy.     Thank you,   Samantha Cameron    Previous Messages      ----- Message -----   From: Delight Stare, RN   Sent: 08/20/2018 12:55 PM   To: Retta Diones., MD, Samantha Spearing, PA-C           Questions      Samantha Spearing, PA-C  You 1 hour ago (1:56 PM)      Hi Samantha Cameron,     Please let the patient know that a prescription for famotidine 20 mg twice daily was sent to her pharmacy.     Thank you,   Samantha Cameron    Routing comment        Samantha Spearing, PA-C routed conversation to You 1 hour ago (1:56 PM)      Samantha Spearing, PA-C 1 hour ago (1:56 PM)         Given FDA warning regarding contamination found in ranitidine, will switch to famotidine 20 mg BID. Rx for famotidine sent to patient's pharmacy and message sent to our nurses to call the patient regarding the change in medication.        Unsigned   Documentation        You routed conversation to Samantha Spearing, PA-C; Retta Diones., MD Yesterday (12:55 PM)      Samantha Cameron Yesterday (12:55 PM)         Call placed to pt. She has stopped her Zantac because of the recall information.                   Samantha Cameron  Female, 62 year old, 06/08/1956  MRN:   [id number]  Phone:   [phone] (H) ...  PCP:   Suann Larry. [name], MD  Primary Cvg:   [place] [facility] A [facility]  Next Appt  With Medical Specialties Izora Gala, MD)  09/23/2018 at 1:00 PM  Dr Owens Shark   Received: Today   Message Contents       Samantha Cameron Nurses Pool    Phone Number: [phone]             [place] [id number], 62 year old, female, Telephone Information:   Home Phone   [phone]   Work Phone   [phone]   Mobile     [phone]       Patient's Preferred Pharmacy:     [facility] - [address], [place]   Phone: [phone] Fax: [phone]       CONFIRMED TODAY: [name] NUMBER: [phone]   Best time to call back: any   Cell phone:   Other phone:  Available times:     Patient's language of care: English     Patient does not need an interpreter.     Patient's PCP: Driscilla Grammes, MD     Person calling on behalf of patient: Patient (self)     Calls today to speak to provider only.     Pt is feeling very sick with burning sensation in stomach and would like to know if Dr Owens Shark will prescribe another medication. Please call back, thank you                   Unsigned   Documentation       Samantha Ross, RN routed conversation to Retta Diones., MD; Samantha Spearing, PA-C 2 days ago      Samantha Ross, RN 2 days ago         Spoke to the patient re: ranitidine  Reviewed recall information as outlined on the FDA's website and all questions were answered  Pt would like Dr. Owens Shark to prescribe a similar treatment option and [facility] [address] [facility] [place]      Unsigned   Documentation       Samantha Ross, RN  [name] I 2 days ago      Samantha Ross, RN 2 days ago         ----- Message from [place] sent at 08/19/2018 12:55 PM EDT -----  Regarding: Dr. Laureen Abrahams - Does Zantac cause cancer?  Contact: [phone]  [place] [id number], 62 year old, female, Telephone Information:  Home Phone      [phone]  Work Phone      [phone]  Mobile          [phone]      Patient's Preferred Pharmacy:     [facility] - [address], [place]  Phone: [phone] Fax: [phone]      CONFIRMED TODAY: [name] NUMBER: [phone]  Best time to call back:   Cell phone:   Other phone:    Available times:    Patient's language of care: English    Patient does not need an interpreter.    Patient's PCP: Driscilla Grammes, MD    Person calling on behalf of patient: Patient (self)    Calls today, concerned. Patient saw on the news, that Zantac causes cancer. Patient takes Zantac.             Documentation       Orders Placed This Encounter     Active     famotidine (PEPCID) 20 MG tablet Ordered On: 08/21/2018       Encounter Status     Open     Call placed to pt. Message left to return call to clinic

## 2018-08-21 NOTE — Progress Notes (Signed)
Given FDA warning regarding contamination found in ranitidine, will switch to famotidine 20 mg BID. Rx for famotidine sent to patient's pharmacy and message sent to our nurses to call the patient regarding the change in medication

## 2018-08-23 ENCOUNTER — Encounter (HOSPITAL_BASED_OUTPATIENT_CLINIC_OR_DEPARTMENT_OTHER): Payer: Self-pay

## 2018-08-24 LAB — CYTOPATH, C/V, THIN LAYER

## 2018-08-24 LAB — SURGICAL PATH SPECIMEN GYN

## 2018-08-28 ENCOUNTER — Encounter (HOSPITAL_BASED_OUTPATIENT_CLINIC_OR_DEPARTMENT_OTHER): Payer: Self-pay

## 2018-08-28 ENCOUNTER — Ambulatory Visit: Payer: Medicare Other | Attending: Obstetrics & Gynecology

## 2018-08-28 ENCOUNTER — Encounter (HOSPITAL_BASED_OUTPATIENT_CLINIC_OR_DEPARTMENT_OTHER): Payer: Medicare Other | Admitting: Obstetrics & Gynecology

## 2018-08-28 VITALS — BP 101/69 | HR 99 | Wt 216.0 lb

## 2018-08-28 DIAGNOSIS — N840 Polyp of corpus uteri: Secondary | ICD-10-CM | POA: Diagnosis not present

## 2018-08-28 DIAGNOSIS — R87610 Atypical squamous cells of undetermined significance on cytologic smear of cervix (ASC-US): Secondary | ICD-10-CM | POA: Diagnosis not present

## 2018-08-28 DIAGNOSIS — R9389 Abnormal findings on diagnostic imaging of other specified body structures: Secondary | ICD-10-CM | POA: Diagnosis not present

## 2018-08-28 NOTE — Patient Instructions (Signed)
Please see an Ob/Gyn MD if you experience any pelvic symptoms and especially if you have any episodes of vaginal bleeding

## 2018-08-28 NOTE — Progress Notes (Signed)
SUBJECTIVE  Samantha Cameron is a 61 year old female presents for f/up biopsy and pap results. Her hx includes chronic gastris, esophagitis, type II diabetes, morbid obesity, GERD. Patient had the [place] on 9/6 as she has been experiencing diffuse lower abdominal pain and a gyn cause needed to be ruled out. She was referred and seen by me on 9/19 for findings on ultrasound which included a thickened endometrium to 8mm. Pt is approx 10 years postmenopausal without any episodes of vaginal spotting or bleeding. Pap and EMB done that day.     Endometrial biopsy 9/19 with following results:    ENDOMETRIAL BIOPSY:   - NO HYPERPLASIA OR MALIGNANCY IDENTIFIED.   - EXCLUSIVELY FRAGMENTS OF BENIGN POLYP WITH MIXED FEATURES OF ENDOMETRIAL/ENDOCERVICAL   POLYP CONTAINING PROMINENT CYSTICALLY DILATED GLANDS, FOCAL CHRONIC INFLAMMATION, AND TUBAL   METAPLASIA.   - SCANT FRAGMENTS OF ENDOCERVICAL EPITHELIUM WITH FOCAL TUBAL METAPLASIA.   - SCANT STRIPS OF ENDOMETRIAL EPITHELIUM.   - ABUNDANT MUCUS WITH FOCAL HISTIOCYTES.        Pap smear with ASCUS, HPV negative. No prior history of abnormal pap smear.     G:4 P:3    ROS:  See HPI  All others reviewed and neg    OBJECTIVE:  BP 101/69  Pulse 99  Wt 98 kg (216 lb)  LMP 10/22/2007 (LMP Unknown)  BMI 41.08 kg/m2  Physical Exam   Constitutional: She is oriented to person, place, and time and well-developed, well-nourished, and in no distress.   Pulmonary/Chest: Effort normal.   Neurological: She is alert and oriented to person, place, and time.   Psychiatric: Affect normal.      A/p  (N84.0) Endometrial polyp  Reviewed results of benign endometrial polyp on pathology, no hyperplasia or malignancy, only scant strips of epithelial endometrium in sample. Recommend monitoring and returning with any vaginal symptoms including bleeding, spotting, or pelvic symptoms. Pt to see MD in Ob/Gyn for further f/up as needed for this.     (R87.610) ASCUS of cervix with negative high risk HPV  Previous  normal pap history  Disc recommendation for repeat cytology in one year or cotesting in 3 years. Given patient's age, will return in one year for cotesting and if normal , can d/c screening after 65.     follow up as needed.      I have spent 15 minutes in face to face time with this patient/patient proxy of which > 50% was in counseling or coordination of care regarding above issues/Dx.      Burman Nieves, NP

## 2018-09-02 ENCOUNTER — Encounter (HOSPITAL_BASED_OUTPATIENT_CLINIC_OR_DEPARTMENT_OTHER): Payer: Self-pay | Admitting: Internal Medicine

## 2018-09-02 DIAGNOSIS — R634 Abnormal weight loss: Secondary | ICD-10-CM | POA: Diagnosis not present

## 2018-09-02 DIAGNOSIS — E119 Type 2 diabetes mellitus without complications: Secondary | ICD-10-CM | POA: Diagnosis not present

## 2018-09-02 DIAGNOSIS — K219 Gastro-esophageal reflux disease without esophagitis: Secondary | ICD-10-CM | POA: Diagnosis not present

## 2018-09-02 DIAGNOSIS — R1013 Epigastric pain: Secondary | ICD-10-CM | POA: Diagnosis not present

## 2018-09-02 DIAGNOSIS — R079 Chest pain, unspecified: Secondary | ICD-10-CM | POA: Diagnosis not present

## 2018-09-02 LAB — SURGICAL PATH SPECIMEN GASTROINTESTINAL

## 2018-09-03 DIAGNOSIS — M5416 Radiculopathy, lumbar region: Secondary | ICD-10-CM | POA: Diagnosis not present

## 2018-09-03 DIAGNOSIS — M47816 Spondylosis without myelopathy or radiculopathy, lumbar region: Secondary | ICD-10-CM | POA: Diagnosis not present

## 2018-09-08 ENCOUNTER — Telehealth (HOSPITAL_BASED_OUTPATIENT_CLINIC_OR_DEPARTMENT_OTHER): Payer: Self-pay | Admitting: Registered Nurse

## 2018-09-08 NOTE — Progress Notes (Signed)
Writer called Samantha Cameron to remind her of appt w/ Dr. Patsey Berthold and to schedule appt w/ her so that we can meet in person and do an assessment.    Appt scheduled for Wednesday at 11AM

## 2018-09-09 ENCOUNTER — Other Ambulatory Visit (HOSPITAL_BASED_OUTPATIENT_CLINIC_OR_DEPARTMENT_OTHER): Payer: Self-pay | Admitting: Internal Medicine

## 2018-09-09 NOTE — Progress Notes (Signed)
PER Pharmacy, Samantha Cameron is a 62 year old female has requested a refill of carafate .      Last Office Visit: 07/25/2018 with Osa Craver   Last Physical Exam: 08/15/2015      Other Med Adult:  Most Recent BP Reading(s)  08/28/18 : 101/69        Cholesterol (mg/dL)   Date Value   10/28/2017 131     LOW DENSITY LIPOPROTEIN DIRECT (mg/dL)   Date Value   10/28/2017 61     HIGH DENSITY LIPOPROTEIN (mg/dL)   Date Value   10/28/2017 47     TRIGLYCERIDES (mg/dL)   Date Value   10/28/2017 240 (H)         THYROID SCREEN TSH REFLEX FT4 (uIU/mL)   Date Value   05/14/2018 1.990         No results found for: TSH    HEMOGLOBIN A1C (%)   Date Value   05/14/2018 6.3 (H)       No results found for: POCA1C      INR (no units)   Date Value   04/18/2017 1.0   05/27/2016 1.0   02/01/2012 < 1.0 (L)       SODIUM (mmol/L)   Date Value   07/30/2018 145       POTASSIUM (mmol/L)   Date Value   07/30/2018 3.9           CREATININE (mg/dL)   Date Value   07/30/2018 0.7       Documented patient preferred pharmacies:    [facility] - [address], [place]  Phone: [phone] Fax: [phone]

## 2018-09-10 ENCOUNTER — Telehealth (HOSPITAL_BASED_OUTPATIENT_CLINIC_OR_DEPARTMENT_OTHER): Payer: Self-pay | Admitting: Registered Nurse

## 2018-09-10 ENCOUNTER — Ambulatory Visit: Payer: Medicare Other | Attending: Psychiatry | Admitting: Psychiatry

## 2018-09-10 ENCOUNTER — Encounter (HOSPITAL_BASED_OUTPATIENT_CLINIC_OR_DEPARTMENT_OTHER): Payer: Medicare Other | Admitting: Registered Nurse

## 2018-09-10 DIAGNOSIS — F410 Panic disorder [episodic paroxysmal anxiety] without agoraphobia: Secondary | ICD-10-CM | POA: Diagnosis not present

## 2018-09-10 MED ORDER — VENLAFAXINE HCL ER 37.5 MG PO CP24
ORAL_CAPSULE | ORAL | 0 refills | Status: DC
Start: 2018-09-10 — End: 2018-10-13
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
## 2018-09-12 ENCOUNTER — Ambulatory Visit: Payer: Medicare Other | Admitting: Registered Nurse

## 2018-09-12 ENCOUNTER — Encounter (HOSPITAL_BASED_OUTPATIENT_CLINIC_OR_DEPARTMENT_OTHER): Payer: Medicare Other | Admitting: Internal Medicine

## 2018-09-12 NOTE — Progress Notes (Signed)
Met w/ pt to complete assessment and discussed goals  She is very pleasant easy to understand  Strength she understands her meds    CCM Admission    Patient contacted via office visit  Care management services explained and questions answered. Patient consented to CCM.     Additional discussion/education/interventions with patient/family: Patient has many strengths which includes her understanding of medications-  Greatly encouraged pt to take the Bentyl that PCP prescribed in September.  She agreed to f/u as she continues to have abdominal discomfort/pain    PAM Completed? No    PLAN:  CM Type: CCM - Plan to complete CM Assessment: 09/15/18    Next contact date: 09/15/18  Next steps/contact plan: 09/15/18- complete goals on assessment

## 2018-09-16 ENCOUNTER — Encounter (HOSPITAL_BASED_OUTPATIENT_CLINIC_OR_DEPARTMENT_OTHER): Payer: Medicare Other | Admitting: Physician Assistant

## 2018-09-16 ENCOUNTER — Ambulatory Visit (HOSPITAL_BASED_OUTPATIENT_CLINIC_OR_DEPARTMENT_OTHER): Payer: Medicare Other | Admitting: Psychiatry

## 2018-09-17 ENCOUNTER — Other Ambulatory Visit (HOSPITAL_BASED_OUTPATIENT_CLINIC_OR_DEPARTMENT_OTHER): Payer: Self-pay | Admitting: Internal Medicine

## 2018-09-17 DIAGNOSIS — R000 Tachycardia, unspecified: Secondary | ICD-10-CM

## 2018-09-17 NOTE — Progress Notes (Signed)
PER Pharmacy, Samantha Cameron is a 62 year old female has requested a refill of metoprolol.    Last Office Visit: 07/25/2018 with Osa Craver   Last Physical Exam: 08/15/2015     COLONOSCOPY due on 09/01/2018  DM EYE EXAM due on 09/12/2018     Other Med Adult:  Most Recent BP Reading(s)  08/28/18 : 101/69        Cholesterol (mg/dL)   Date Value   10/28/2017 131     LOW DENSITY LIPOPROTEIN DIRECT (mg/dL)   Date Value   10/28/2017 61     HIGH DENSITY LIPOPROTEIN (mg/dL)   Date Value   10/28/2017 47     TRIGLYCERIDES (mg/dL)   Date Value   10/28/2017 240 (H)         THYROID SCREEN TSH REFLEX FT4 (uIU/mL)   Date Value   05/14/2018 1.990         No results found for: TSH    HEMOGLOBIN A1C (%)   Date Value   05/14/2018 6.3 (H)       No results found for: POCA1C      INR (no units)   Date Value   04/18/2017 1.0   05/27/2016 1.0   02/01/2012 < 1.0 (L)       SODIUM (mmol/L)   Date Value   07/30/2018 145       POTASSIUM (mmol/L)   Date Value   07/30/2018 3.9           CREATININE (mg/dL)   Date Value   07/30/2018 0.7        Documented patient preferred pharmacies:    [facility] - [address], [place]  Phone: [phone] Fax: [phone]

## 2018-09-18 ENCOUNTER — Other Ambulatory Visit (HOSPITAL_BASED_OUTPATIENT_CLINIC_OR_DEPARTMENT_OTHER): Payer: Self-pay

## 2018-09-23 ENCOUNTER — Ambulatory Visit: Payer: Medicare Other | Attending: Gastroenterology | Admitting: Gastroenterology

## 2018-09-23 ENCOUNTER — Encounter (HOSPITAL_BASED_OUTPATIENT_CLINIC_OR_DEPARTMENT_OTHER): Payer: Self-pay | Admitting: Gastroenterology

## 2018-09-23 VITALS — BP 116/88 | HR 100 | Wt 220.0 lb

## 2018-09-23 DIAGNOSIS — R109 Unspecified abdominal pain: Secondary | ICD-10-CM | POA: Diagnosis not present

## 2018-09-23 DIAGNOSIS — R634 Abnormal weight loss: Secondary | ICD-10-CM | POA: Diagnosis not present

## 2018-09-23 NOTE — Progress Notes (Signed)
Please see previous notes for details (chart reviewed).    PCP:  Driscilla Grammes, MD  [place], [place] [address]      Chief complaint: abd discomfort, heartburn and wt loss.   Came for follow up     Briefly, this 62 year old patient presents for follow up.   Since last seen, the pain has improved a lot. The paitent has minimal symptoms of pain but very careful in what she eats.   Had Bravo test in BI  ######START [place] REPORT#########   In summary, this study shows an overall normal amount of acid  reflux over the 48 hours of the study based on acid exposure time  of 1.7% (normal total AET 4.4-4.9%) and deemester of 7.4 (normal  <14.7).  There was no correlation between one symptom of  heartburn and one symptom of cough with acid reflux during the  Study.  ##########END COPYING FROM REPORT#########    Had MRI in July 2019  1. 2 pancreatic lipomas corresponding to the finding seen on CT. No evidence of pancreatic cystic lesions or neoplasm.  2. Mild biliary ductal dilatation, likely secondary to a postcholecystectomy state.    CT abdomen July 2019  IMPRESSION:   1. No acute intra-abdominal process.  2. Mild hepatic steatosis.  3. Status post cholecystectomy. Slightly prominent common bile duct and intrahepatic ductal dilatation.   4. 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 If indicated, follow-up with an MRCP is suggested.    Currently takes Carafate and Malox     Used lidocaine gerd for very limited period ( very few days). Knows that was only for the breakthrough symptoms ( was off ppi before the bravo).     Currently also on Famotidine and bentyl.      Has 3 bm/ day .     Feels that her symptoms may be partially due to anxiety. Symptoms improved with effexor.     Weight dropped. Trend as the following.     Vitals 09/23/2018 08/28/2018 08/07/2018 07/30/2018 07/25/2018   WEIGHT (kg)  99.791 kg 97.977 kg 97.251 kg 99.791 kg 99.791 kg     Vitals 07/20/2018 07/16/2018 07/11/2018 07/06/2018 06/28/2018   WEIGHT (kg) 99.791 kg 99.791 kg 98.884 kg 102.513 kg 102.513 kg     Vitals 06/23/2018 06/18/2018 06/12/2018 06/11/2018 06/05/2018   WEIGHT (kg) 102.513 kg   102.513 kg 102.513 kg     Vitals 06/02/2018 05/25/2018 05/23/2018 05/22/2018 05/20/2018   WEIGHT (kg)  104.327 kg 103.874 kg 104.327 kg      Vitals 05/19/2018 05/19/2018 05/19/2018 05/14/2018 05/14/2018   WEIGHT (kg) 104.327 kg  103.874 kg 106.595 kg      Vitals 05/13/2018 05/13/2018 05/13/2018 05/12/2018 05/11/2018   WEIGHT (kg) 108.863 kg  108.863 kg  108.863 kg     Vitals 05/10/2018 05/09/2018 04/02/2018 03/28/2018 03/28/2018   WEIGHT (kg)  110.3 kg 106.867 kg       Vitals 03/26/2018 03/25/2018 03/25/2018 03/25/2018 10/28/2017   WEIGHT (kg)  113.399 kg  105.688 kg 105.688 kg     Vitals 08/14/2017 07/31/2017   WEIGHT (kg) 104.781 kg 103.42 kg         Been avoiding fatty food, fried food     Past Medical History  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) ([place])     Tobacco use  disorder     Spinal stenosis of lumbar region     HLD (hyperlipidemia)     Chronic bilateral low back pain with bilateral sciatica     Skin change     Pineal gland cyst     History of vitamin D deficiency     Hemorrhoids     Venous (peripheral) insufficiency     Adrenal adenoma     Type 2 diabetes mellitus with microalbuminuria, without long-term current use of insulin (HCC)     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 (HCC)     Acute pain of right wrist     Abnormal PFTs     Screening for malignant neoplasm of breast     Skin irritation     H/O bone density study     Vertigo     Orthostatic hypotension     Social problem     Right arm weakness     Insomnia     Acute pain of left knee     Hx of total knee replacement, left     Acute bacterial sinusitis     Seasonal allergic rhinitis     Left foot pain     Stress due to family tension     Leucocytosis      Chronic gastritis     Common bile duct dilation     Fatty liver     Pancreatic lesion     Vascular calcification     Umbilical hernia without obstruction and without gangrene     Arthritis of spine     Scoliosis of thoracic spine     Anxiety     Sleep disturbance     Reflux esophagitis     First degree AV block     Obstructive sleep apnea     Unintentional weight loss     Abdominal pain     Fibroids     Endometrial thickening on ultrasound      COMPLEX CARE MANAGEMENT-PRIMARY CARE BASED     ASCUS of cervix with negative high risk HPV      Past Surgical History  Past Surgical History:  No date: ANES NERVE MUSC TENDON FASCIA&BURSA KNEE&/POPLT  No date: FOOT SURGERY      Comment:  bilateral  No date: LAPAROSCOPY SURG CHOLECYSTECTOMY  No date: OB ANTEPARTUM CARE CESAREAN DLVR & POSTPARTUM      Comment:  x3  No date: TONSILLECTOMY & ADENOIDECTOMY <AGE 71  04/13/2009: TOTAL KNEE REPLACEMENT      Comment:  left  08/11/2009: WRIST GANGLION EXCISION      Comment:  left     Review of Patient's Allergies indicates:   Codeine camsylate       Shortness of Breath, Other (See                            Comments)    Comment:Chest pain   Motrin [ibuprofen]      Nausea Only   Sulfa antibiotics       Other (See Comments)   Bactrim                 Rash, Itching    Current Outpatient Medications   Medication Sig    metoprolol (TOPROL-XL) 25 MG 24 hr tablet take 1 tablet by mouth once daily    venlafaxine (EFFEXOR-XR) 37.5 MG 24 hr capsule Take 1 capsule daily for 2 weeks (total: 37.109m daily).  Then take 2 capsules daily (total: 22m daily).    CARAFATE 1 GM/10ML suspension TAKE 10 MLS BY MOUTH 4 TIMES DAILY    famotidine (PEPCID) 20 MG tablet Take 1 tablet by mouth 2 (two) times daily    atorvastatin (LIPITOR) 20 MG tablet take 1 tablet by mouth once daily    glucose blood (ONETOUCH VERIO) test strip TEST once daily    glucose blood test strip by Finger Test or Other route daily Dispense test strips covered by insurance.     Blood Glucose Monitoring Suppl (GLUCOSE MONITORING KIT) monitoring kit Dispense glucometer that is covered by insurance. Dx Code: E11.29    lidocaine (XYLOCAINE) 2 % solution Take 10 mLs by mouth every 8 (eight) hours as needed for Pain    dicyclomine (BENTYL) 10 MG capsule Take 1 capsule by mouth 4 (four) times daily before meals and nightly    SM ANTACID ADVANCED 200-200-20 MG/5ML suspension TAKE 30ML BY MOUTH EVERY 6 (SIX) HOURS AS NEEDED FOR INDIGESTION    fluticasone (FLONASE) 50 MCG/ACT nasal spray 1 spray by Each Nostril route daily    albuterol (PROVENTIL) (2.5 MG/3ML) 0.083% nebulizer solution Take 3 mLs by nebulization every 6 (six) hours as needed for Wheezing    lisinopril (PRINIVIL,ZESTRIL) 20 MG tablet take 1 tablet by mouth once daily    ONETOUCH DELICA LANCETS FINE MISC TEST once daily    metFORMIN (GLUCOPHAGE-XR) 500 MG 24 hr tablet take 4 tablets by mouth once daily WITH BREAKFAST    albuterol (PROVENTIL HFA,VENTOLIN HFA, PROAIR HFA) 108 (90 Base) MCG/ACT inhaler Inhale 2 puffs into the lungs every 6 (six) hours as needed for Wheezing or Shortness of breath    Omega-3 Fatty Acids (FISH OIL) 1000 MG CAPS Take by mouth    cholecalciferol (VITAMIN D3) 1000 UNIT tablet Take 1 tablet by mouth daily    pregabalin (LYRICA) 100 MG capsule Take 100 mg by mouth as needed With Dr KDwyane Dee        oxycodone-acetaminophen (PERCOCET) 5-325 MG per tablet Take 1 tablet by mouth 2 (two) times daily as needed With Dr KDwyane Dee   aspirin 81 MG tablet Take 81 mg by mouth daily.     No current facility-administered medications for this visit.          Physical Exam:  Vital Signs: BP 116/88 (Site: RA, Position: Sitting, Cuff Size: Lrg)  Pulse 100  Wt 99.8 kg (220 lb)  LMP 10/22/2007 (LMP Unknown)  SpO2 98%  BMI 41.84 kg/m2  General: no acute distress.   HEENT: Normocephalic, mmm.  No scleral icterus.   Oropharynx: Clear with no erythema or exudates  No lymphadenopathy  Chest: good air entry   Abdominal: Normal  bowel sounds. Non-tender.  No hepatosplenomegaly.      Assessment/Plan:  abd pain and wt loss. Had extensive evaluation as above. Improved with dietry restrictions but still with symptoms.     Recommendations   Refer for colonoscopy for the weight loss.   stool for Hpylori to confirm eradication   Will check with Dr SJamal Collinif we can start  amitriptyline

## 2018-09-23 NOTE — Progress Notes (Signed)
Nurse teaching given on colonoscopy preparation  and the patient expresses understanding and acceptance of instructions. Written material provided.  Lisa Roca, RN 09/23/2018 1:15 PM

## 2018-09-23 NOTE — Patient Instructions (Addendum)
Refer for colonoscopy     Stool test

## 2018-09-24 ENCOUNTER — Ambulatory Visit: Payer: Medicare Other | Attending: Psychiatry

## 2018-09-24 DIAGNOSIS — F410 Panic disorder [episodic paroxysmal anxiety] without agoraphobia: Secondary | ICD-10-CM | POA: Insufficient documentation

## 2018-09-25 NOTE — Progress Notes (Signed)
OUTPATIENT PSYCHIATRY PROGRESS NOTE    INTERPRETER: No    CONTACT INFO FOR OTHER AGENCIES AND MENTAL HEALTH PROVIDERS (IF APPLICABLE): N/A    PROBLEM(S) ADDRESSED IN THIS SESSION:     1. Physical health issues  2. Anxiety  3. Hx of loss/grief response      SUBJECTIVE  TODAY'S CHIEF COMPLAINT AND CLINICAL UPDATES IN PATIENT'S WORDS:  1) Chief Complaint (Patient and/or guardian's own words, concerns and expressed thoughts):     "My stomach still burns a lot and then I get horrible anxiety attacks. I have had periods of not sleeping for 4-5 days because of the pain in my stomach."    2) New information from patient and/or collateral (Patient's illness: context, course, modifying factors, severity, cultural, family, social, medical history): pt endorses previous dx of GERD, spinal stenosis, sciatica, and anxiety. Pt reports that she struggles w/acid reflux that leads to panic attacks.     OBJECTIVE  DATA REVIEWED (Consider medical labs, radiology, other medical tests; screening/outcome measures; psychological testing; discussion of test results with other clinicians; consultation with other clinicians and systems involved with patient, summary of old records): previous note    CURRENT MEDICATIONS (make clear medications prescribed by psychiatry; include OTC medications):  Current Outpatient Medications   Medication Sig    metoprolol (TOPROL-XL) 25 MG 24 hr tablet take 1 tablet by mouth once daily    venlafaxine (EFFEXOR-XR) 37.5 MG 24 hr capsule Take 1 capsule daily for 2 weeks (total: 37.39m daily). Then take 2 capsules daily (total: 731mdaily).    CARAFATE 1 GM/10ML suspension TAKE 10 MLS BY MOUTH 4 TIMES DAILY    famotidine (PEPCID) 20 MG tablet Take 1 tablet by mouth 2 (two) times daily    atorvastatin (LIPITOR) 20 MG tablet take 1 tablet by mouth once daily    glucose blood (ONETOUCH VERIO) test strip TEST once daily    glucose blood test strip by Finger Test or Other route daily Dispense test strips  covered by insurance.    Blood Glucose Monitoring Suppl (GLUCOSE MONITORING KIT) monitoring kit Dispense glucometer that is covered by insurance. Dx Code: E11.29    lidocaine (XYLOCAINE) 2 % solution Take 10 mLs by mouth every 8 (eight) hours as needed for Pain    SM ANTACID ADVANCED 200-200-20 MG/5ML suspension TAKE 30ML BY MOUTH EVERY 6 (SIX) HOURS AS NEEDED FOR INDIGESTION    fluticasone (FLONASE) 50 MCG/ACT nasal spray 1 spray by Each Nostril route daily    albuterol (PROVENTIL) (2.5 MG/3ML) 0.083% nebulizer solution Take 3 mLs by nebulization every 6 (six) hours as needed for Wheezing    lisinopril (PRINIVIL,ZESTRIL) 20 MG tablet take 1 tablet by mouth once daily    ONETOUCH DELICA LANCETS FINE MISC TEST once daily    metFORMIN (GLUCOPHAGE-XR) 500 MG 24 hr tablet take 4 tablets by mouth once daily WITH BREAKFAST    albuterol (PROVENTIL HFA,VENTOLIN HFA, PROAIR HFA) 108 (90 Base) MCG/ACT inhaler Inhale 2 puffs into the lungs every 6 (six) hours as needed for Wheezing or Shortness of breath    Omega-3 Fatty Acids (FISH OIL) 1000 MG CAPS Take by mouth    cholecalciferol (VITAMIN D3) 1000 UNIT tablet Take 1 tablet by mouth daily    pregabalin (LYRICA) 100 MG capsule Take 100 mg by mouth as needed With Dr KuDwyane Dee       oxycodone-acetaminophen (PERCOCET) 5-325 MG per tablet Take 1 tablet by mouth 2 (two) times daily as needed With Dr KuDwyane Dee  aspirin 81 MG tablet Take 81 mg by mouth daily.     No current facility-administered medications for this visit.        MEDICATION ADHERENCE (including barriers and how addressed):Yes    MEDICATION SIDE EFFECTS (Prescribers Only): N/A    BIRTH CONTROL (ask females and males): N/A    CURRENT PREGNANCY: N/A      MENTAL STATUS EXAMINATION       Appearance: Appears stated age, well groomed  Behavior: WNL  Alertness:  WNL  Speech:  WNL  Mood: "slightly better"  Affect:  Frustrated, congruent  Thought Process: WNL  Thought Content:  Past history, medical  problems  Perceptions:  None  Judgment/Impulse Control: Good    Insight:  Fair   Cognition: Good  Suicidal/Homicidal: Denies SI/HI     ASSESSMENT   Today's Assessment:  -Pt states that she has been seeing her GI doctor and tx seems to be working somewhat for her stomach, which has minimized panic attacks  -Pt seems continues to somaticize anxiety and depression  -Pt states that she has had periods of not sleeping for 4-5 nights in a row, but since stomach pain has decreased and anxiety has as well, she is getting more sleep in the past few weeks  -Pt explored her hx of loss w/death of her son, father, and brother within 39 mos. Of each other several years ago  -Pt is the guardian for one of son's daughter's, who is 73 yrs old        Risk Level Assessment  Risk Level Change (if yes, please describe): No    Suicide: low (1)  Violence: low (1)  Addiction: low        Protective Factors: some supportive family, stable housing    DIAGNOSES ASSESSED TODAY (psychiatric diagnoses and medical diagnoses that factor into management of psychiatric treatment): panic d/o    CLINICAL FORMULATION (Make changes as your understanding changes. Should coincide with treatment plan):      Pt is a 62yo divorced woman, current guardian of 15yo granddaughter, of [place] descent, appearing in treatment with medical compliance and complaints of insomnia and acid reflux that results in SOB and panic attacks. Pt states that she was told by her doctor that she should see a therapist. Pt states that frequent acid reflux and panic have led to multiple trips to the ED. Pt reports feeling that people in the ED and her doctor are not taking her concerns seriously, and she appears to not have much insight around how anxiety symptoms may be contributing to her ED visits. Pt also has a history of the traumatic loss of her son due to an overdose. Pt may benefit from therapy to learn methods of managing anxiety and panic symptoms, to reduce ED visits and  insomnia.      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     PROGRESS TOWARDS GOALS: ongoing    TIME SPENT IN PSYCHOTHERAPY: 45 min    PLAN  PLAN FOR MANAGING RISK (Consider risk plan for patients at moderate or high risk for suicide/violence/addiction; medication plan; referrals, etc. Must coincide with treatment plan.): N/A  PLAN FOR ONGOING TREATMENT:  Individual therapy, meeting e/o week    INFORMED CONSENT (for any new treatment): N/A      ONLY FOR PRESCRIBERS DOING EVALUATION AND MANAGEMENT VISITS     Use only when no psychotherapy performed  COUNSELING AND COORDINATION OF CARE PROVIDED (Consider diagnostic results/impressions and/or recommended studies; risks and benefits of treatment options; instruction for management/treatment and/or follow-up; importance of compliance with chosen treatment options; risk factor reduction; patient/family/caregiver education; prognosis): N/A      Over 50% of time during today's visit was devoted to counseling and/or coordination of care.  If yes, record estimated duration of the face to face encounter.  N/A    INSTRUCTIONS TO COVERING CLINICIANS:  N/A      .Marland KitchenMarland Kitchen

## 2018-10-01 DIAGNOSIS — Z79891 Long term (current) use of opiate analgesic: Secondary | ICD-10-CM | POA: Diagnosis not present

## 2018-10-01 DIAGNOSIS — M5416 Radiculopathy, lumbar region: Secondary | ICD-10-CM | POA: Diagnosis not present

## 2018-10-01 DIAGNOSIS — M47817 Spondylosis without myelopathy or radiculopathy, lumbosacral region: Secondary | ICD-10-CM | POA: Diagnosis not present

## 2018-10-08 ENCOUNTER — Ambulatory Visit (HOSPITAL_BASED_OUTPATIENT_CLINIC_OR_DEPARTMENT_OTHER): Payer: Medicare Other

## 2018-10-13 ENCOUNTER — Ambulatory Visit: Payer: Medicare Other | Attending: Psychiatry | Admitting: Psychiatry

## 2018-10-13 DIAGNOSIS — Z720 Tobacco use: Secondary | ICD-10-CM | POA: Diagnosis not present

## 2018-10-13 DIAGNOSIS — F410 Panic disorder [episodic paroxysmal anxiety] without agoraphobia: Secondary | ICD-10-CM | POA: Diagnosis present

## 2018-10-13 MED ORDER — FLUOXETINE HCL 20 MG PO CAPS: 20 mg | capsule | Freq: Every day | ORAL | 1 refills | 0 days | Status: AC

## 2018-10-13 MED ORDER — FLUOXETINE HCL 20 MG PO CAPS
20.0000 mg | ORAL_CAPSULE | Freq: Every day | ORAL | 1 refills | Status: DC
Start: 2018-10-13 — End: 2018-11-27

## 2018-10-13 NOTE — Progress Notes (Signed)
OUTPATIENT PSYCHIATRY PROGRESS NOTE    INTERPRETER: No    CONTACT INFO FOR OTHER AGENCIES AND MENTAL HEALTH PROVIDERS (IF APPLICABLE):   PCP: Osa Craver, MD ([place])  Individual Therapist: Mariann Laster, LICSW ()    PROBLEM(S) ADDRESSED IN THIS SESSION:   1. Depressive sxs  2. Anxiety sxs  3. Headaches, physical sxs affecting mood  4. Psychosocial stressors      SUBJECTIVE  TODAY'S CHIEF COMPLAINT AND CLINICAL UPDATES IN PATIENT'S WORDS:  1) Chief Complaint (Patient and/or guardian's own words, concerns and expressed thoughts):   "I'm doing okay. A couple days ago, my granddaughter was in a car accident."    2) New information from patient and/or collateral (Patient's illness: context, course, modifying factors, severity, cultural, family, social, medical history):     Pt reports headaches on venlafaxine XR.  States that she had significant headaches when increasing dose to 71m daily.  Tried it for about a couple of weeks.  Is now taking 37.546mdaily.   Continues to experience headache.    Discuss various potential causes for headache: antidepressants, dehydration, insomnia, stress, physical pain    Pt shares that granddaughter was recently in [place] her biological [place].  Biological Mother appeared to be sedated/mentally compromised.  She is on medications that she might be abusing?   DCF now involved.  Granddaughter "okay"  Pt is legal guardian of granddaughter.    Reports her stress re: this accident.  Shares how it brought up feelings re: her (now deceased) son.  Has been difficulty sleeping the last few days.  Generally sleeping okay.    Smoking about 5 to 6 cigs after having stopped for about 5 months.   Drinking 2 to 3 large cups of coffee.    Somewhat anxious. No recent panic attacks.    Remains concerned about physical pain, sxs, including back pain, "stomach issues"  Eating, taking care of self.    Medications:  Now taking 37.22m42maily of venlafaxine XR.   Will discontinue and initiate trial of  fluoxetine 50m60mily.  BH eCarrabellecation, Psychopharm education provided.       OBJECTIVE  DATA REVIEWED (Consider medical labs, radiology, other medical tests; screening/outcome measures; psychological testing; discussion of test results with other clinicians; consultation with other clinicians and systems involved with patient, summary of old records):     Prior Epic documentation was reviewed.     CURRENT MEDICATIONS (make clear medications prescribed by psychiatry; include OTC medications):  Current Outpatient Medications   Medication Sig    metoprolol (TOPROL-XL) 25 MG 24 hr tablet take 1 tablet by mouth once daily    venlafaxine (EFFEXOR-XR) 37.5 MG 24 hr capsule Take 1 capsule daily for 2 weeks (total: 37.22mg 58mly). Then take 2 capsules daily (total: 722mg 34my).    CARAFATE 1 GM/10ML suspension TAKE 10 MLS BY MOUTH 4 TIMES DAILY    famotidine (PEPCID) 20 MG tablet Take 1 tablet by mouth 2 (two) times daily    atorvastatin (LIPITOR) 20 MG tablet take 1 tablet by mouth once daily    glucose blood (ONETOUCH VERIO) test strip TEST once daily    glucose blood test strip by Finger Test or Other route daily Dispense test strips covered by insurance.    Blood Glucose Monitoring Suppl (GLUCOSE MONITORING KIT) monitoring kit Dispense glucometer that is covered by insurance. Dx Code: E11.29    fluticasone (FLONASE) 50 MCG/ACT nasal spray 1 spray by Each Nostril route daily    albuterol (PROVENTIL) (2.5 MG/3ML)  0.083% nebulizer solution Take 3 mLs by nebulization every 6 (six) hours as needed for Wheezing    lisinopril (PRINIVIL,ZESTRIL) 20 MG tablet take 1 tablet by mouth once daily    ONETOUCH DELICA LANCETS FINE MISC TEST once daily    metFORMIN (GLUCOPHAGE-XR) 500 MG 24 hr tablet take 4 tablets by mouth once daily WITH BREAKFAST    albuterol (PROVENTIL HFA,VENTOLIN HFA, PROAIR HFA) 108 (90 Base) MCG/ACT inhaler Inhale 2 puffs into the lungs every 6 (six) hours as needed for Wheezing or Shortness of breath     Omega-3 Fatty Acids (FISH OIL) 1000 MG CAPS Take by mouth    cholecalciferol (VITAMIN D3) 1000 UNIT tablet Take 1 tablet by mouth daily    pregabalin (LYRICA) 100 MG capsule Take 100 mg by mouth as needed With Dr Dwyane Dee         oxycodone-acetaminophen (PERCOCET) 5-325 MG per tablet Take 1 tablet by mouth 2 (two) times daily as needed With Dr Dwyane Dee    aspirin 81 MG tablet Take 81 mg by mouth daily.     No current facility-administered medications for this visit.        MEDICATION ADHERENCE (including barriers and how addressed):No, side effects (headaches)    MEDICATION SIDE EFFECTS (Prescribers Only): Yes, venlafaxine - headaches    BIRTH CONTROL (ask females and males): No    CURRENT PREGNANCY: No                                    MENTAL STATUS EXAMINATION                     General Appearance: Within normal limits     Interaction with Interviewer (eye contact, attitude, behavior): at patient baseline    Physical Signs    Gait and Station (how patient walks and stands): at patient baseline                  Physical Appearance: Within normal limits          Normal Movements: at patient baseline         Speech (rate, volume, articulation): at patient baseline          Language: at patient baseline                       Mood: okay           Affect: restricted, reactive at times           Thought Process     Rate; concrete vs abstract reasoning: Within normal limits        Logical vs illogical; associations: loose, tangential, circumstantial, intact: Within normal limits                                    Thought Content    Normal Thought Content (other than safety): Within normal limits     Perceptions (auditory, visual, tactile, etc.): Within normal limits       Impulse Control: Within normal limits         Cognition (Link to [facility])    Orientation (person, place, time): Within normal limits      Recent and remote memory: Within normal limits           Attention span and concentration: Within  normal limits          Fund of knowledge, awareness of current events and vocabulary: Within normal limits      Judgment: Fair         Insight: Fair       Suicidality/Homicidality/Aggression (Victimization or Perpetration): Denies SI, SA, SIB, HI, AVH, access to weapons/guns. No sxs consistent with psychosis, acute mania.       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 Pt reports experiencing headaches while on venlafaxine XR. Also reports ongoing stress due to complicated family dynamics. We discuss at length the multiple causes for headache. Venlafaxine XR is discontinued and pt is started on fluoxetine. No dangerous behaviors. No acute safety concerns.     Risk Level Assessment  Risk Level Change (if yes, please describe): No    Suicide: 1   Violence: 1   Addiction: 1    Protective Factors: Stable housing, income. Granddaughter. Engagement with providers and treatment. Pt is hopeful and future-oriented.    DIAGNOSES ASSESSED TODAY (psychiatric diagnoses and medical diagnoses that factor into management of psychiatric treatment):   Panic disorder  Tobacco use      CLINICAL FORMULATION (Make changes as your understanding changes. Should coincide with treatment plan):   14F, housed, unemployed (on disability), divorced, current guardian of teenage granddaughter, with panic attacks and heightening anxiety in the context of GERD/significant abdominal discomfort, with medical hx significant for DMII, Spinal stenosis, chronic back/joint pain, connected to [facility] primary care and individual therapist, presenting to [facility] Adult  Outpatient Psychiatry to establish psychopharm services. Although pt has experienced mild anxiety sxs and a couple panic attacks in the past, pt shares that her panic attacks and ongoing anxiety are triggered by physiological sensations that trigger her into believing she is about to experience an uncomfortable GI episode. Pt states that she has been on venlafaxine in the past for her anxiety and panic, but self-discontinued it after being told by a Sister that it was "bad for [her]." Pt is educated extensively about potential benefits and side effects of the medication. She is also educated about potential ramifications of untreated medical and psychiatric conditions on her wellbeing. She is informed that it is important to weigh the benefits and potential risks when considering different interventions. Pt will require extensive psychopharm and behavioral health education. She is also encouraged to continue with individual therapy, which she agreeable with doing. She is agreeable with regular, consistent follow-up visits. No acute safety concerns (pt is NOT suicidal, homicidal, psychotic, or manic).     REVIEWING TODAY'S VISIT  CLINICAL INTERVENTIONS TODAY: Supportive therapy, Psychopharm education, Behavioral health education, Extensive conversation over treatment engagement and medication adherence across different care providers, Coordination of Care.    PATIENT'S RESPONSE TO INTERVENTIONS: Engaged    PROGRESS TOWARDS GOALS: Positive    Estimated time spent in psychotherapy: N/A     PLAN  Psychopharmacology:  Fluoxetine 50m daily  -Discussed with patient, the common side effects related to starting antidepressants, including agitation, GI symptoms, headache, sexual side effects.   -Discussed discontinuation syndrome, if patient abruptly discontinues the medication.  -Discussed serotonin syndrome, if patient is on multiple serotonergic medications.  -Discussed risks  of bleeding associated with serotonergic  medications    Psychotherapy:  - Continue with individual therapy    Medical:  - Continue to follow-up with PCP   - Appreciate PCP recs    Substances:  -  No active substance use     Safety:   - Patient given my card with VM and # to PES. Voices understanding of crisis plan to call PES, 911, or go to nearest emergency room if feeling unable to maintain her safety or the safety of those around her. No evidence of imminent risk of harm to self or others at this time.    Follow-up:   RTC 4 to 6 weeks    INFORMED CONSENT (for any new treatment): Patient was informed of the potential risks and benefits of the treatment, including the option not to treat, and appeared to understand and agreed to comply. Discussion included the following key points: potential benefits and side effects of medication regimen, care coordination among providers.      ONLY FOR PRESCRIBERS DOING EVALUATION AND MANAGEMENT VISITS     Use only when no psychotherapy performed  COUNSELING AND COORDINATION OF CARE PROVIDED (Consider diagnostic results/impressions and/or recommended studies; risks and benefits of treatment options; instruction for management/treatment and/or follow-up; importance of compliance with chosen treatment options; risk factor reduction; patient/family/caregiver education; prognosis): Yes. Behavioral health education. Psychopharm education. Care coordination among providers. Safety planning.       Over 50% of time during today's visit was devoted to counseling and/or coordination of care.  If yes, record estimated duration of the face to face encounter.  Yes. Estimated Time: 40 mins    INSTRUCTIONS TO COVERING CLINICIANS:  OK to refill.        Melida Gimenez, MD, MPH  AdultOutpatient Psychiatry  [facility]  pgr [phone]

## 2018-10-22 ENCOUNTER — Ambulatory Visit (HOSPITAL_BASED_OUTPATIENT_CLINIC_OR_DEPARTMENT_OTHER): Payer: Medicare Other

## 2018-10-29 DIAGNOSIS — M5416 Radiculopathy, lumbar region: Secondary | ICD-10-CM | POA: Diagnosis not present

## 2018-10-29 DIAGNOSIS — M47816 Spondylosis without myelopathy or radiculopathy, lumbar region: Secondary | ICD-10-CM | POA: Diagnosis not present

## 2018-10-30 ENCOUNTER — Ambulatory Visit (HOSPITAL_BASED_OUTPATIENT_CLINIC_OR_DEPARTMENT_OTHER): Payer: Medicare Other

## 2018-11-04 ENCOUNTER — Ambulatory Visit: Payer: Medicare Other | Attending: Pulmonary

## 2018-11-04 DIAGNOSIS — G4733 Obstructive sleep apnea (adult) (pediatric): Secondary | ICD-10-CM | POA: Insufficient documentation

## 2018-11-05 ENCOUNTER — Ambulatory Visit: Payer: Medicare Other | Attending: Internal Medicine | Admitting: Internal Medicine

## 2018-11-05 ENCOUNTER — Encounter (HOSPITAL_BASED_OUTPATIENT_CLINIC_OR_DEPARTMENT_OTHER): Payer: Self-pay | Admitting: Internal Medicine

## 2018-11-05 VITALS — BP 102/73 | HR 96 | Ht 60.79 in | Wt 215.0 lb

## 2018-11-05 DIAGNOSIS — M5442 Lumbago with sciatica, left side: Secondary | ICD-10-CM | POA: Diagnosis present

## 2018-11-05 DIAGNOSIS — E1129 Type 2 diabetes mellitus with other diabetic kidney complication: Secondary | ICD-10-CM | POA: Diagnosis present

## 2018-11-05 DIAGNOSIS — N840 Polyp of corpus uteri: Secondary | ICD-10-CM | POA: Insufficient documentation

## 2018-11-05 DIAGNOSIS — J069 Acute upper respiratory infection, unspecified: Secondary | ICD-10-CM | POA: Diagnosis present

## 2018-11-05 DIAGNOSIS — D219 Benign neoplasm of connective and other soft tissue, unspecified: Secondary | ICD-10-CM | POA: Diagnosis not present

## 2018-11-05 DIAGNOSIS — G8929 Other chronic pain: Secondary | ICD-10-CM | POA: Diagnosis present

## 2018-11-05 DIAGNOSIS — M5441 Lumbago with sciatica, right side: Secondary | ICD-10-CM | POA: Diagnosis present

## 2018-11-05 DIAGNOSIS — F410 Panic disorder [episodic paroxysmal anxiety] without agoraphobia: Secondary | ICD-10-CM | POA: Diagnosis not present

## 2018-11-05 DIAGNOSIS — R809 Proteinuria, unspecified: Secondary | ICD-10-CM

## 2018-11-05 DIAGNOSIS — K760 Fatty (change of) liver, not elsewhere classified: Secondary | ICD-10-CM | POA: Insufficient documentation

## 2018-11-05 DIAGNOSIS — R0989 Other specified symptoms and signs involving the circulatory and respiratory systems: Secondary | ICD-10-CM | POA: Diagnosis present

## 2018-11-05 DIAGNOSIS — G4733 Obstructive sleep apnea (adult) (pediatric): Secondary | ICD-10-CM | POA: Insufficient documentation

## 2018-11-05 DIAGNOSIS — R87610 Atypical squamous cells of undetermined significance on cytologic smear of cervix (ASC-US): Secondary | ICD-10-CM | POA: Insufficient documentation

## 2018-11-05 LAB — LIPID PANEL
Cholesterol: 143 mg/dL (ref 0–239)
HIGH DENSITY LIPOPROTEIN: 46 mg/dL (ref 40–?)
LOW DENSITY LIPOPROTEIN DIRECT: 70 mg/dL (ref 0–189)
TRIGLYCERIDES: 229 mg/dL — ABNORMAL HIGH (ref 0–150)

## 2018-11-05 MED ORDER — ACETAMINOPHEN 500 MG PO TABS
500.00 mg | ORAL_TABLET | Freq: Two times a day (BID) | ORAL | 0 refills | Status: AC | PRN
Start: 2018-11-05 — End: 2018-12-05

## 2018-11-05 MED ORDER — ACETAMINOPHEN 500 MG PO TABS: 500 mg | tablet | Freq: Two times a day (BID) | ORAL | 0 refills | 0 days | Status: AC | PRN

## 2018-11-06 ENCOUNTER — Other Ambulatory Visit (HOSPITAL_BASED_OUTPATIENT_CLINIC_OR_DEPARTMENT_OTHER): Payer: Self-pay | Admitting: Physician Assistant

## 2018-11-06 LAB — HEMOGLOBIN A1C
ESTIMATED AVERAGE GLUCOSE: 128 (ref 74–160)
HEMOGLOBIN A1C: 6.1 % — ABNORMAL HIGH (ref 4.0–5.6)

## 2018-11-06 LAB — MICROALBUMIN RANDOM URINE
ALB/CREAT RATIO URINE RAN: 366 ug/mg (ref 0–30)
ALBUMIN URINE RANDOM: 71.3 mg/dL — ABNORMAL HIGH (ref 0.0–1.9)
CREATININE RANDOM URINE: 195 mg/dl

## 2018-11-06 NOTE — Progress Notes (Signed)
PER Pharmacy, Samantha Cameron is a 62 year old female has requested a refill of famotidine.      Last Office Visit: 09/23/2018 with Lenore Cordia  Last Physical Exam: 08/15/2015    COLONOSCOPY due on 09/01/2018  DM EYE EXAM due on 09/12/2018    Other Med Adult:  Most Recent BP Reading(s)  11/05/18 : 102/73        Cholesterol (mg/dL)   Date Value   11/05/2018 143     LOW DENSITY LIPOPROTEIN DIRECT (mg/dL)   Date Value   11/05/2018 70     HIGH DENSITY LIPOPROTEIN (mg/dL)   Date Value   11/05/2018 46     TRIGLYCERIDES (mg/dL)   Date Value   11/05/2018 229 (H)         THYROID SCREEN TSH REFLEX FT4 (uIU/mL)   Date Value   05/14/2018 1.990         No results found for: TSH    HEMOGLOBIN A1C (%)   Date Value   11/05/2018 6.1 (H)       No results found for: POCA1C      INR (no units)   Date Value   04/18/2017 1.0   05/27/2016 1.0   02/01/2012 < 1.0 (L)       SODIUM (mmol/L)   Date Value   07/30/2018 145       POTASSIUM (mmol/L)   Date Value   07/30/2018 3.9           CREATININE (mg/dL)   Date Value   07/30/2018 0.7       Documented patient preferred pharmacies:    [facility] - [address], [place]  Phone: [phone] Fax: [phone]

## 2018-11-07 ENCOUNTER — Ambulatory Visit (HOSPITAL_BASED_OUTPATIENT_CLINIC_OR_DEPARTMENT_OTHER): Payer: Medicare Other

## 2018-11-18 DIAGNOSIS — N840 Polyp of corpus uteri: Secondary | ICD-10-CM | POA: Insufficient documentation

## 2018-11-18 DIAGNOSIS — F410 Panic disorder [episodic paroxysmal anxiety] without agoraphobia: Secondary | ICD-10-CM | POA: Insufficient documentation

## 2018-11-18 DIAGNOSIS — R0989 Other specified symptoms and signs involving the circulatory and respiratory systems: Secondary | ICD-10-CM | POA: Insufficient documentation

## 2018-11-18 HISTORY — DX: Other specified symptoms and signs involving the circulatory and respiratory systems: R09.89

## 2018-11-18 NOTE — Progress Notes (Signed)
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) ([place])     Tobacco use disorder     Spinal stenosis of lumbar region     HLD (hyperlipidemia)     Chronic bilateral low back pain with bilateral sciatica     Skin change     Pineal gland cyst     History of vitamin D deficiency     Hemorrhoids     Venous (peripheral) insufficiency     Adrenal adenoma     Type 2 diabetes mellitus with microalbuminuria, without long-term current use of insulin (HCC)     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 (HCC)     Acute pain of right wrist     Abnormal PFTs     Screening for malignant neoplasm of breast     Skin irritation     H/O bone density study     Vertigo     Orthostatic hypotension     Social problem     Right arm weakness     Insomnia     Acute pain of left knee     Hx of total knee replacement, left     Acute bacterial sinusitis     Seasonal allergic rhinitis     Left foot pain     Stress due to family tension     Leucocytosis     Chronic gastritis     Common bile duct dilation     Fatty liver     Pancreatic lesion     Vascular calcification     Umbilical hernia without obstruction and without gangrene     Arthritis of spine     Scoliosis of thoracic spine     Anxiety     Sleep disturbance     Reflux esophagitis     First degree AV block     Obstructive sleep apnea     Unintentional weight loss     Abdominal pain     Fibroids     Endometrial thickening on ultrasound      COMPLEX CARE MANAGEMENT-PRIMARY CARE BASED     ASCUS of cervix with negative high risk HPV    On metformin  3 days ago FS 76, asymptomatic  Thinks may not have eaten as much  HEMOGLOBIN A1C (%)   Date Value       05/14/2018 6.3 (H)   10/28/2017 6.1 (H)       No results found for: POCA1C    Recent cold, cough, chest congestion  Worried about what can use with DM  No fever, CP, SOB    Hx OSA - recent cpap titration - waiting for  results    Working with gyn  Hx ascus neg hpv - plan cotest in 1 year  Also endometrial polyp, fibroids  Fatty liver also seen on imaging    Most Recent BP Reading(s)  11/05/18 : 102/73  09/23/18 : 116/88  08/28/18 : 101/69  08/07/18 : 97/67  07/30/18 : 111/82    Chronic back pain - lyrica, percocet    Panic - working with mental health team  Most Recent Weight Reading(s)  11/05/18 : 97.5 kg (215 lb)  09/23/18 : 99.8 kg (220 lb)  08/28/18 : 98 kg (216 lb)  08/07/18 : 97.3 kg (214 lb 6.4 oz)  07/30/18 : 99.8 kg (  220 lb)    PE:  BP 102/73  Pulse 96  Ht 5' 0.79" (1.544 m)  Wt 97.5 kg (215 lb)  LMP 10/22/2007 (LMP Unknown)  SpO2 97%  BMI 40.91 kg/m2  Estimated body mass index is 40.91 kg/m as calculated from the following:    Height as of this encounter: 5' 0.79" (1.544 m).    Weight as of this encounter: 97.5 kg (215 lb).  Gen - Morbidly obese middle aged female upright in chair in NAD  HEENT - MMM  CV - RRR, no m/r/g  Resp - CTAB  Neuro - A+Ox3  Psych - Appropriate    A/P:  (E11.29,  R80.9) Type 2 diabetes mellitus with microalbuminuria, without long-term current use of insulin (HCC)  (primary encounter diagnosis)  Comment: Controlled at last check, once recent borderline low - check labs and reassess, continue metformin with caution re steady eating tid for now  Plan: MICROALBUMIN RANDOM URINE, HEMOGLOBIN A1C,         LIPID PANEL  Due for eye exam    (E66.01) 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 (HCC)  Comment: By BMI  Plan: Discussed portion control, healthy food choice, exercise    (G47.33) Obstructive sleep apnea  Comment: plan for cpap  Plan: waiting for results    (N84.0) Endometrial polyp  (D21.9) Fibroid  (R87.610) ASCUS of cervix with negative high risk HPV  Comment: working with gyn  Plan: [place] 08/2019    (K76.0) Fatty liver  Comment: noted on imaging  Plan: weight loss, avoid alcohol    (F41.0) Panic disorder  Comment: working with mental health team  Plan: Continue current regimen and  follow-up    (J06.9) Viral URI  (R09.89) Chest congestion  Comment: No signs sinusitis, AOM, strep throat, or PNA, likely viral URI  Plan: Discussed supportive care with rest, hydration, tea with honey, humidified air.  Advised to call or return to clinic if has fever, SOB, CP, worsening symptoms or persistent symptoms after 1-2 weeks.    Discussed diabetic tussin    (M54.42,  M54.41,  G89.29) Chronic bilateral low back pain with bilateral sciatica  Comment: cautious addition of tylenol separate from percocet - monitor liver function  Plan: acetaminophen (TYLENOL) 500 MG tablet

## 2018-11-24 DIAGNOSIS — M5417 Radiculopathy, lumbosacral region: Secondary | ICD-10-CM | POA: Diagnosis not present

## 2018-11-26 DIAGNOSIS — Z79891 Long term (current) use of opiate analgesic: Secondary | ICD-10-CM | POA: Diagnosis not present

## 2018-11-26 DIAGNOSIS — M5417 Radiculopathy, lumbosacral region: Secondary | ICD-10-CM | POA: Diagnosis not present

## 2018-11-26 DIAGNOSIS — M5416 Radiculopathy, lumbar region: Secondary | ICD-10-CM | POA: Diagnosis not present

## 2018-11-26 DIAGNOSIS — M47816 Spondylosis without myelopathy or radiculopathy, lumbar region: Secondary | ICD-10-CM | POA: Diagnosis not present

## 2018-11-27 ENCOUNTER — Telehealth (HOSPITAL_BASED_OUTPATIENT_CLINIC_OR_DEPARTMENT_OTHER): Payer: Self-pay | Admitting: Registered Nurse

## 2018-11-27 ENCOUNTER — Ambulatory Visit: Payer: Medicare Other | Attending: Psychiatry | Admitting: Psychiatry

## 2018-11-27 DIAGNOSIS — F410 Panic disorder [episodic paroxysmal anxiety] without agoraphobia: Secondary | ICD-10-CM | POA: Diagnosis not present

## 2018-11-27 DIAGNOSIS — Z720 Tobacco use: Secondary | ICD-10-CM | POA: Diagnosis not present

## 2018-11-27 MED ORDER — FLUOXETINE HCL 20 MG PO CAPS
20.0000 mg | ORAL_CAPSULE | Freq: Every day | ORAL | 2 refills | Status: DC
Start: 2018-11-27 — End: 2019-01-09

## 2018-11-27 MED ORDER — FLUOXETINE HCL 20 MG PO CAPS: 20 mg | capsule | Freq: Every day | ORAL | 2 refills | 0 days | Status: AC

## 2018-11-27 NOTE — Progress Notes (Addendum)
CCM Encounter    Primary Purpose of Contact: CM Assessment  Individuals involved: Patient    Summary of discussion and interventions: Discussed that pt will follow up w/ GI for Colonoscopy in February of 2020  Discussed the management of symptoms when she has the stomach pains  Wants stomach to heal before colonoscopy    CM Goals addressed: Following up w/ Specialty Care and getting Colonoscopy  Follow up w/ Mental Health to Manage Anxiety when she has stomach pain.    Assessment and Plan  Progress to date toward CM Goals: PT has decreased ED Visits and has not been in the ED for almost 4 months    Care plan revised or updated: Yes    Next steps and plan: Follow up w/ pt in one month  Planned date of next contact: 12/28/18

## 2018-11-27 NOTE — Progress Notes (Signed)
OUTPATIENT PSYCHIATRY PROGRESS NOTE    INTERPRETER: No    CONTACT INFO FOR OTHER AGENCIES AND MENTAL HEALTH PROVIDERS (IF APPLICABLE):   PCP: Osa Craver, MD ([place])  Individual Therapist: Mariann Laster, LICSW ([place])    PROBLEM(S) ADDRESSED IN THIS SESSION:   1. Depressive sxs  2. Anxiety sxs  3. Headaches, physical sxs affecting mood  4. Psychosocial stressors      SUBJECTIVE  TODAY'S CHIEF COMPLAINT AND CLINICAL UPDATES IN PATIENT'S WORDS:  1) Chief Complaint (Patient and/or guardian's own words, concerns and expressed thoughts):   ""Now DCF is involved against mother looking after my granddaughter."    2) New information from patient and/or collateral (Patient's illness: context, course, modifying factors, severity, cultural, family, social, medical history):     Mood: Feeling more stable, calmer.  Shares that the "happy pills" have helped improve her mood.    DCF active.  Granddaughter under custody of pt.  Granddaughter not allowed to have contact with Mother.    Continuing to smoke cigs.  Drinks coffee.    L4/L5/S1  Received epidural injection recently for back pain.    Medications:  Medication adherent.  No side effects - no head aches.     No SI/HI.  No dangerous behaviors.  No acute safety concerns.    OBJECTIVE  DATA REVIEWED (Consider medical labs, radiology, other medical tests; screening/outcome measures; psychological testing; discussion of test results with other clinicians; consultation with other clinicians and systems involved with patient, summary of old records):     Prior Epic documentation was reviewed.     CURRENT MEDICATIONS (make clear medications prescribed by psychiatry; include OTC medications):  Current Outpatient Medications   Medication Sig    famotidine (PEPCID) 20 MG tablet take 1 tablet by mouth twice a day    acetaminophen (TYLENOL) 500 MG tablet Take 1 tablet by mouth 2 (two) times daily as needed for Pain    FLUoxetine (PROZAC) 20 MG capsule Take 1 capsule by mouth daily     metoprolol (TOPROL-XL) 25 MG 24 hr tablet take 1 tablet by mouth once daily    atorvastatin (LIPITOR) 20 MG tablet take 1 tablet by mouth once daily    glucose blood (ONETOUCH VERIO) test strip TEST once daily    glucose blood test strip by Finger Test or Other route daily Dispense test strips covered by insurance.    Blood Glucose Monitoring Suppl (GLUCOSE MONITORING KIT) monitoring kit Dispense glucometer that is covered by insurance. Dx Code: E11.29    fluticasone (FLONASE) 50 MCG/ACT nasal spray 1 spray by Each Nostril route daily    albuterol (PROVENTIL) (2.5 MG/3ML) 0.083% nebulizer solution Take 3 mLs by nebulization every 6 (six) hours as needed for Wheezing    lisinopril (PRINIVIL,ZESTRIL) 20 MG tablet take 1 tablet by mouth once daily    ONETOUCH DELICA LANCETS FINE MISC TEST once daily    metFORMIN (GLUCOPHAGE-XR) 500 MG 24 hr tablet take 4 tablets by mouth once daily WITH BREAKFAST    albuterol (PROVENTIL HFA,VENTOLIN HFA, PROAIR HFA) 108 (90 Base) MCG/ACT inhaler Inhale 2 puffs into the lungs every 6 (six) hours as needed for Wheezing or Shortness of breath    Omega-3 Fatty Acids (FISH OIL) 1000 MG CAPS Take by mouth    cholecalciferol (VITAMIN D3) 1000 UNIT tablet Take 1 tablet by mouth daily    pregabalin (LYRICA) 100 MG capsule Take 100 mg by mouth as needed With Dr Dwyane Dee         oxycodone-acetaminophen (  PERCOCET) 5-325 MG per tablet Take 1 tablet by mouth 2 (two) times daily as needed With Dr Dwyane Dee    aspirin 81 MG tablet Take 81 mg by mouth daily.     No current facility-administered medications for this visit.        MEDICATION ADHERENCE (including barriers and how addressed):No, side effects (headaches)    MEDICATION SIDE EFFECTS (Prescribers Only): Yes, venlafaxine - headaches    BIRTH CONTROL (ask females and males): No    CURRENT PREGNANCY: No                                    MENTAL STATUS EXAMINATION                     General Appearance: Within normal  limits     Interaction with Interviewer (eye contact, attitude, behavior): at patient baseline    Physical Signs    Gait and Station (how patient walks and stands): at patient baseline                  Physical Appearance: Within normal limits          Normal Movements: at patient baseline         Speech (rate, volume, articulation): at patient baseline          Language: at patient baseline                       Mood: "good"           Affect: full range           Thought Process     Rate; concrete vs abstract reasoning: Within normal limits        Logical vs illogical; associations: loose, tangential, circumstantial, intact: Within normal limits                                    Thought Content    Normal Thought Content (other than safety): Within normal limits     Perceptions (auditory, visual, tactile, etc.): Within normal limits       Impulse Control: Within normal limits         Cognition (Link to [facility])    Orientation (person, place, time): Within normal limits      Recent and remote memory: Within normal limits           Attention span and concentration: Within normal limits         Fund of knowledge, awareness of current events and vocabulary: Within normal limits      Judgment: Fair         Insight: Fair       Suicidality/Homicidality/Aggression (Victimization or Perpetration): Denies SI, SA, SIB, HI, AVH, access to weapons/guns. No sxs consistent with psychosis, acute mania.       ASSESSMENT   Today's Assessment:  Reports improved mood.  DCF active with granddaughter (under pt's custody).  No dangerous behaviors.  No acute safety concerns.    Risk Level Assessment  Risk Level Change (if yes, please describe): No    Suicide: 1   Violence: 1   Addiction: 1    Protective Factors: Stable housing, income. Granddaughter. Engagement with providers and treatment. Pt is hopeful and future-oriented.    DIAGNOSES ASSESSED TODAY (psychiatric  diagnoses and medical diagnoses that factor into management of psychiatric  treatment):   Panic disorder  Tobacco use      CLINICAL FORMULATION (Make changes as your understanding changes. Should coincide with treatment plan):   49F, housed, unemployed (on disability), divorced, current guardian of teenage granddaughter, with panic attacks and heightening anxiety in the context of GERD/significant abdominal discomfort, with medical hx significant for DMII, Spinal stenosis, chronic back/joint pain, connected to [facility] primary care and individual therapist, presenting to [facility] to establish psychopharm services. Although pt has experienced mild anxiety sxs and a couple panic attacks in the past, pt shares that her panic attacks and ongoing anxiety are triggered by physiological sensations that trigger her into believing she is about to experience an uncomfortable GI episode. Pt states that she has been on venlafaxine in the past for her anxiety and panic, but self-discontinued it after being told by a Sister that it was "bad for [her]." Pt is educated extensively about potential benefits and side effects of the medication. She is also educated about potential ramifications of untreated medical and psychiatric conditions on her wellbeing. She is informed that it is important to weigh the benefits and potential risks when considering different interventions. Pt will require extensive psychopharm and behavioral health education. She is also encouraged to continue with individual therapy, which she agreeable with doing. She is agreeable with regular, consistent follow-up visits. No acute safety concerns (pt is NOT suicidal, homicidal, psychotic, or manic).     REVIEWING TODAY'S VISIT  CLINICAL INTERVENTIONS TODAY: Supportive therapy, Psychopharm education, Behavioral health education, Extensive conversation over treatment engagement and medication adherence across different care providers, Coordination of Care.    PATIENT'S RESPONSE TO INTERVENTIONS:  Engaged    PROGRESS TOWARDS GOALS: Positive    Estimated time spent in psychotherapy: N/A     PLAN  Psychopharmacology:  Fluoxetine 13m daily  -Discussed with patient, the common side effects related to starting antidepressants, including agitation, GI symptoms, headache, sexual side effects.   -Discussed discontinuation syndrome, if patient abruptly discontinues the medication.  -Discussed serotonin syndrome, if patient is on multiple serotonergic medications.  -Discussed risks of bleeding associated with serotonergic medications    Psychotherapy:  - Encouraged to continue with individual therapy    Medical:  - Continue to follow-up with PCP   - Appreciate PCP recs    Substances:  -  No active substance use     Safety:   - Patient given my card with VM and # to P[facility]. Voices understanding of crisis plan to call [facility], 911, or go to nearest emergency room if feeling unable to maintain her safety or the safety of those around her. No evidence of imminent risk of harm to self or others at this time.    Follow-up:   RTC 4 to 6 weeks    INFORMED CONSENT (for any new treatment): Patient was informed of the potential risks and benefits of the treatment, including the option not to treat, and appeared to understand and agreed to comply. Discussion included the following key points: potential benefits and side effects of medication regimen, care coordination among providers.      ONLY FOR PRESCRIBERS DOING EVALUATION AND MANAGEMENT VISITS     Use only when no psychotherapy performed  COUNSELING AND COORDINATION OF CARE PROVIDED (Consider diagnostic results/impressions and/or recommended studies; risks and benefits of treatment options; instruction for management/treatment and/or follow-up; importance of compliance with chosen treatment options; risk factor reduction; patient/family/caregiver  education; prognosis): Yes. Behavioral health education. Psychopharm education. Care coordination among providers. Safety  planning.       Over 50% of time during today's visit was devoted to counseling and/or coordination of care.  If yes, record estimated duration of the face to face encounter.  Yes. Estimated Time: 40 mins    INSTRUCTIONS TO COVERING CLINICIANS:  OK to refill.        Melida Gimenez, MD, MPH  AdultOutpatient Psychiatry  [facility]  pgr [phone]

## 2018-12-01 ENCOUNTER — Other Ambulatory Visit (HOSPITAL_BASED_OUTPATIENT_CLINIC_OR_DEPARTMENT_OTHER): Payer: Self-pay | Admitting: Internal Medicine

## 2018-12-01 ENCOUNTER — Encounter (HOSPITAL_BASED_OUTPATIENT_CLINIC_OR_DEPARTMENT_OTHER): Payer: Self-pay | Admitting: Internal Medicine

## 2018-12-01 DIAGNOSIS — R809 Proteinuria, unspecified: Principal | ICD-10-CM

## 2018-12-01 DIAGNOSIS — G4733 Obstructive sleep apnea (adult) (pediatric): Secondary | ICD-10-CM

## 2018-12-01 DIAGNOSIS — E1129 Type 2 diabetes mellitus with other diabetic kidney complication: Secondary | ICD-10-CM

## 2018-12-01 LAB — CPAP TITRATION

## 2018-12-01 MED ORDER — LANCETS: each | 11 refills | 0 days | Status: AC

## 2018-12-01 MED ORDER — LANCETS
11 refills | Status: AC
Start: 2018-12-01 — End: 2019-12-01

## 2018-12-01 NOTE — Progress Notes (Signed)
Sleep Study Findings:  *Please see below regarding communication of results to patient, next steps, and other helpful information.      Recent CPAP titration sleep study demonstrated:  - Limited assessment due to low sleep efficiency (22% on this night)  - During limited periods of sleep, appeared to have good response to CPAP.  - Limb movements prominent during wake and sleep--clinical correlation for RLS recommended, also reasonable to ensure ferritin is >50 given high burden of limb movement (can be associated with SSRIs and other meds).  - Borderline hypoxia during wake (89-90%), clinical correlation and need for any add'l work-up recommended.    I have placed an order for CPAP equipment, referral to DME will be made by sleep lab. Pt should expect call in coming weeks to setup CPAP.  See below for more details.    Results of study and next steps will need to be communicated to patient by ordering provider.    Thanks to all.  Ordering provider cc'd here.        *The results of this patient's recent sleep study are now available in the Cardio/Pulm/Neuro tab (in-lab studies) or will soon be available scanned under the Media tab (home sleep studies).  The major findings are summarized above. The full report can be reviewed with additional details.    These results have not been communicated to the patient, and are now available for the ordering physician to go over with the patient and discuss management.  The sleep team is unable to review the results of the study with every patient unless they are already followed in sleep clinic. Sleep lab will assist with booking studies and facilitating CPAP equipment referrals, however lab is unable to address questions about report findings. These should be discussed with patient by ordering team; patient can also be offered a sleep clinic appointment for additional input.     If OSA is diagnosed, the sleep lab will help facilitate a CPAP titration study if needed (eg could  not be completed on diagnostic night due to time constraints). Once insurance approval is obtained for the CPAP study, the sleep lab will call the patient to schedule. Patients may also call the sleep lab at [phone] to schedule the CPAP titration study if desired.  Some insurance plans may not approve a CPAP titration study, in which case autoset CPAP device is typically approved by insurance. Sleep team will assist with this prescription if needed.    If CPAP has already been titrated (eg split night study or CPAP titration study), sleep team will order CPAP equipment to prevent delays in starting therapy. This typically takes a few weeks to arrange. Patients should be counseled to expect a call from a DME company to set up equipment at their home.    Please note that some health plans may not cover CPAP therapy ([employer], [employer])--patients will be offered self-pay for refurbished machine. Alternatives (such as basic machine through a non-profit foundation) may be available--if desired please refer patient to sleep clinic to discuss how to obtain this.  Medicare patients require a face-to-face visit between 31-90 days after CPAP set up to document use and benefit, in order for ongoing insurance coverage of therapy.     For patients diagnosed with OSA, a sleep clinic appointment will be automatically attempted to be scheduled after CPAP equipment is set-up, in an effort to ensure necessary documentation for ongoing CPAP therapy is completed.  Mask clinic is available at  [facility] if needed for mask fitting. Please contact sleep lab Epic pool to ask about this.    If additional input is desired earlier in the process, sleep consultation can be obtained at any time through a referral to sleep clinic, and questions about the process can be directed to the Sleep Lab EPIC message pool.  Thanks to all.

## 2018-12-01 NOTE — Progress Notes (Signed)
PER Patient (self), Samantha Cameron is a 63 year old female has requested a refill of Lancets.      Last Office Visit: 11/05/18 with Lenore Cordia  Last Physical Exam: 08/15/15    COLONOSCOPY due on 09/01/2018  DM EYE EXAM due on 09/12/2018    Diabetic Med:   HEMOGLOBIN A1C (%)   Date Value   11/05/2018 6.1 (H)       No results found for: POCA1C   CREATININE (mg/dL)   Date Value   07/30/2018 0.7       Documented patient preferred pharmacies:    [facility] - [place], [place]  Phone: [phone] Fax: [phone]

## 2018-12-03 ENCOUNTER — Telehealth (HOSPITAL_BASED_OUTPATIENT_CLINIC_OR_DEPARTMENT_OTHER): Payer: Self-pay

## 2018-12-03 NOTE — Progress Notes (Signed)
DWO printed and placed on cart to be placed in providers box for signature

## 2018-12-04 ENCOUNTER — Other Ambulatory Visit (HOSPITAL_BASED_OUTPATIENT_CLINIC_OR_DEPARTMENT_OTHER): Payer: Self-pay | Admitting: Psychiatry

## 2018-12-04 ENCOUNTER — Other Ambulatory Visit (HOSPITAL_BASED_OUTPATIENT_CLINIC_OR_DEPARTMENT_OTHER): Payer: Self-pay | Admitting: Internal Medicine

## 2018-12-04 DIAGNOSIS — R109 Unspecified abdominal pain: Secondary | ICD-10-CM

## 2018-12-04 NOTE — Progress Notes (Signed)
PER Pharmacy, Samantha Cameron is a 63 year old female has requested a refill of bentyl.      Last Office Visit: 11/05/2018 with pcp  Last Physical Exam: 08/15/2015    COLONOSCOPY due on 09/01/2018  DM EYE EXAM due on 09/12/2018    Other Med Adult:  Most Recent BP Reading(s)  11/05/18 : 102/73        Cholesterol (mg/dL)   Date Value   11/05/2018 143     LOW DENSITY LIPOPROTEIN DIRECT (mg/dL)   Date Value   11/05/2018 70     HIGH DENSITY LIPOPROTEIN (mg/dL)   Date Value   11/05/2018 46     TRIGLYCERIDES (mg/dL)   Date Value   11/05/2018 229 (H)         THYROID SCREEN TSH REFLEX FT4 (uIU/mL)   Date Value   05/14/2018 1.990         No results found for: TSH    HEMOGLOBIN A1C (%)   Date Value   11/05/2018 6.1 (H)       No results found for: POCA1C      INR (no units)   Date Value   04/18/2017 1.0   05/27/2016 1.0   02/01/2012 < 1.0 (L)       SODIUM (mmol/L)   Date Value   07/30/2018 145       POTASSIUM (mmol/L)   Date Value   07/30/2018 3.9           CREATININE (mg/dL)   Date Value   07/30/2018 0.7       Documented patient preferred pharmacies:    [facility] - [address], [place]  Phone: [phone] Fax: [phone]

## 2018-12-24 DIAGNOSIS — M5136 Other intervertebral disc degeneration, lumbar region: Secondary | ICD-10-CM | POA: Diagnosis not present

## 2018-12-24 DIAGNOSIS — M47817 Spondylosis without myelopathy or radiculopathy, lumbosacral region: Secondary | ICD-10-CM | POA: Diagnosis not present

## 2018-12-31 ENCOUNTER — Telehealth (HOSPITAL_BASED_OUTPATIENT_CLINIC_OR_DEPARTMENT_OTHER): Payer: Self-pay | Admitting: Registered Nurse

## 2018-12-31 ENCOUNTER — Ambulatory Visit: Payer: Medicare Other | Attending: Psychiatry

## 2018-12-31 DIAGNOSIS — F410 Panic disorder [episodic paroxysmal anxiety] without agoraphobia: Secondary | ICD-10-CM | POA: Diagnosis present

## 2018-12-31 NOTE — Progress Notes (Signed)
CCM Encounter    Primary Purpose of Contact: Appointment reminder or follow-up  Individuals involved: Patient    Summary of discussion and interventions: Left VM msg reminding pt she has an appt w/ Mariann Laster, LICSW today @ 44:61JU    CM Goals addressed: Coordination of Specialty Care    Assessment and Plan  Progress to date toward CM Goals: Unknown at this time.    Care plan revised or updated: No    Next steps and plan: Call pt on 01/12/19 after her appt w/ Dr. Duwayne Heck.  Monitor recommendations given by Medical and Behavioral Health Team.  Planned date of next contact: 01/12/19

## 2019-01-01 NOTE — Progress Notes (Signed)
OUTPATIENT PSYCHIATRY PROGRESS NOTE    INTERPRETER: No    CONTACT INFO FOR OTHER AGENCIES AND MENTAL HEALTH PROVIDERS (IF APPLICABLE): N/A    PROBLEM(S) ADDRESSED IN THIS SESSION:     1. Physical health issues  2. Anxiety  3. Hx of loss/grief response      SUBJECTIVE  TODAY'S CHIEF COMPLAINT AND CLINICAL UPDATES IN PATIENT'S WORDS:  1) Chief Complaint (Patient and/or guardian's own words, concerns and expressed thoughts):     "I haven't slept well since my son died."    2) New information from patient and/or collateral (Patient's illness: context, course, modifying factors, severity, cultural, family, social, medical history): pt endorses previous dx of GERD, spinal stenosis, sciatica, and anxiety. Pt reports that she struggles w/acid reflux that leads to panic attacks. Pt denies recent panic attacks, denies feeling anxious, and reports that changes to diet have resulted in less GI distress.     OBJECTIVE  DATA REVIEWED (Consider medical labs, radiology, other medical tests; screening/outcome measures; psychological testing; discussion of test results with other clinicians; consultation with other clinicians and systems involved with patient, summary of old records): previous note    CURRENT MEDICATIONS (make clear medications prescribed by psychiatry; include OTC medications):  Current Outpatient Medications   Medication Sig    dicyclomine (BENTYL) 10 MG capsule take 1 capsule by mouth four times a day BEFORE MEALS AND NIGHTLY    Lancets Use 1 time daily.  Dispense lancets that are covered by insurance. Dx e11.29    FLUoxetine (PROZAC) 20 MG capsule Take 1 capsule by mouth daily    famotidine (PEPCID) 20 MG tablet take 1 tablet by mouth twice a day    metoprolol (TOPROL-XL) 25 MG 24 hr tablet take 1 tablet by mouth once daily    atorvastatin (LIPITOR) 20 MG tablet take 1 tablet by mouth once daily    glucose blood (ONETOUCH VERIO) test strip TEST once daily    glucose blood test strip by Finger Test or  Other route daily Dispense test strips covered by insurance.    Blood Glucose Monitoring Suppl (GLUCOSE MONITORING KIT) monitoring kit Dispense glucometer that is covered by insurance. Dx Code: E11.29    fluticasone (FLONASE) 50 MCG/ACT nasal spray 1 spray by Each Nostril route daily    albuterol (PROVENTIL) (2.5 MG/3ML) 0.083% nebulizer solution Take 3 mLs by nebulization every 6 (six) hours as needed for Wheezing    lisinopril (PRINIVIL,ZESTRIL) 20 MG tablet take 1 tablet by mouth once daily    ONETOUCH DELICA LANCETS FINE MISC TEST once daily    metFORMIN (GLUCOPHAGE-XR) 500 MG 24 hr tablet take 4 tablets by mouth once daily WITH BREAKFAST    albuterol (PROVENTIL HFA,VENTOLIN HFA, PROAIR HFA) 108 (90 Base) MCG/ACT inhaler Inhale 2 puffs into the lungs every 6 (six) hours as needed for Wheezing or Shortness of breath    Omega-3 Fatty Acids (FISH OIL) 1000 MG CAPS Take by mouth    cholecalciferol (VITAMIN D3) 1000 UNIT tablet Take 1 tablet by mouth daily    pregabalin (LYRICA) 100 MG capsule Take 100 mg by mouth as needed With Dr Dwyane Dee         oxycodone-acetaminophen (PERCOCET) 5-325 MG per tablet Take 1 tablet by mouth 2 (two) times daily as needed With Dr Dwyane Dee    aspirin 81 MG tablet Take 81 mg by mouth daily.     No current facility-administered medications for this visit.        MEDICATION ADHERENCE (including barriers  and how addressed):Yes    MEDICATION SIDE EFFECTS (Prescribers Only): N/A    BIRTH CONTROL (ask females and males): N/A    CURRENT PREGNANCY: N/A      MENTAL STATUS EXAMINATION       Appearance: Appears stated age, well groomed  Behavior: WNL  Alertness:  WNL  Speech:  WNL  Mood: "pretty good"  Affect:  mood-congruent  Thought Process: WNL  Thought Content:  Past history, medical problems  Perceptions:  None  Judgment/Impulse Control: Good    Insight:  Fair   Cognition: Good  Suicidal/Homicidal: Denies SI/HI     ASSESSMENT   Today's Assessment:  -Pt states that she has been seeing  her GI doctor and tx continues to be working somewhat for her stomach, which has minimized panic attacks  -Pt states that she has had periods of not sleeping for 4-5 nights in a row, but since stomach pain has decreased and anxiety has as well, she is getting more sleep in the past few weeks  -Pt explored her hx of loss w/death of her son, father, and brother within 35 mos of each other several years ago  -Pt discussed frustration around elevator in building being broken for last several months and states it's been hard to leave the apartment because of her mobility issues        Risk Level Assessment  Risk Level Change (if yes, please describe): No    Suicide: low (1)  Violence: low (1)  Addiction: low        Protective Factors: some supportive family, stable housing    DIAGNOSES ASSESSED TODAY (psychiatric diagnoses and medical diagnoses that factor into management of psychiatric treatment): panic d/o    CLINICAL FORMULATION (Make changes as your understanding changes. Should coincide with treatment plan):      Pt is a 63yo divorced woman, current guardian of 15yo granddaughter, of [place] descent, appearing in treatment with medical compliance and complaints of insomnia and acid reflux that results in SOB and panic attacks. Pt states that she was told by her doctor that she should see a therapist. Pt states that frequent acid reflux and panic have led to multiple trips to the ED. Pt reports feeling that people in the ED and her doctor are not taking her concerns seriously, and she appears to not have much insight around how anxiety symptoms may be contributing to her ED visits. Pt also has a history of the traumatic loss of her son due to an overdose. Pt may benefit from therapy to learn methods of managing anxiety and panic symptoms, to reduce ED visits and insomnia.      REVIEWING TODAY'S VISIT  CLINICAL INTERVENTIONS TODAY:  explored physical health issues and how anxiety can manifest in different ways in  the body; discussed feelings around continuing therapy    PATIENT'S RESPONSE TO INTERVENTIONS: pt was well-engaged, supportive of the process, and receptive to this writer's interventions mostly.     PROGRESS TOWARDS GOALS: ongoing    TIME SPENT IN PSYCHOTHERAPY: 45 min    PLAN  PLAN FOR MANAGING RISK (Consider risk plan for patients at moderate or high risk for suicide/violence/addiction; medication plan; referrals, etc. Must coincide with treatment plan.): N/A    PLAN FOR ONGOING TREATMENT: individual therapy, moving to monthly check-ins per pt's request and decreased medical necessity    INFORMED CONSENT (for any new treatment): N/A      ONLY FOR PRESCRIBERS DOING EVALUATION AND MANAGEMENT VISITS  Use only when no psychotherapy performed  COUNSELING AND COORDINATION OF CARE PROVIDED (Consider diagnostic results/impressions and/or recommended studies; risks and benefits of treatment options; instruction for management/treatment and/or follow-up; importance of compliance with chosen treatment options; risk factor reduction; patient/family/caregiver education; prognosis): N/A      Over 50% of time during today's visit was devoted to counseling and/or coordination of care.  If yes, record estimated duration of the face to face encounter.  N/A    INSTRUCTIONS TO COVERING CLINICIANS:  N/A      .Marland KitchenMarland KitchenMarland Kitchen

## 2019-01-09 ENCOUNTER — Ambulatory Visit: Payer: Medicare Other | Attending: Psychiatry | Admitting: Psychiatry

## 2019-01-09 DIAGNOSIS — F410 Panic disorder [episodic paroxysmal anxiety] without agoraphobia: Secondary | ICD-10-CM | POA: Diagnosis not present

## 2019-01-09 DIAGNOSIS — Z720 Tobacco use: Secondary | ICD-10-CM | POA: Diagnosis not present

## 2019-01-09 MED ORDER — FLUOXETINE HCL 20 MG PO CAPS
20.0000 mg | ORAL_CAPSULE | Freq: Every day | ORAL | 2 refills | Status: DC
Start: 2019-01-09 — End: 2019-03-15

## 2019-01-09 MED ORDER — FLUOXETINE HCL 20 MG PO CAPS: 20 mg | capsule | Freq: Every day | ORAL | 2 refills | 0 days | Status: AC

## 2019-01-09 NOTE — Progress Notes (Signed)
OUTPATIENT PSYCHIATRY PROGRESS NOTE    INTERPRETER: No    CONTACT INFO FOR OTHER AGENCIES AND MENTAL HEALTH PROVIDERS (IF APPLICABLE):   PCP: Osa Craver, MD ([place])  Individual Therapist: Mariann Laster, LICSW ([place])    PROBLEM(S) ADDRESSED IN THIS SESSION:   1. Depressive sxs  2. Anxiety sxs  3. Headaches, physical sxs affecting mood  4. Psychosocial stressors      SUBJECTIVE  TODAY'S CHIEF COMPLAINT AND CLINICAL UPDATES IN PATIENT'S WORDS:  1) Chief Complaint (Patient and/or guardian's own words, concerns and expressed thoughts):   "My elevator isn't working, but I'm hanging in there."    2) New information from patient and/or collateral (Patient's illness: context, course, modifying factors, severity, cultural, family, social, medical history):     Mood: Feeling stable, calm, collected.    Discusses dynamics with granddaughter.  Shares that her elevator in her building isn't working - aggravating pain/physical sxs.  Colonoscopy next week.    Continuing to smoke cigs.  Drinks coffee.    Medications:  Medication adherent.  No side effects.    No SI/HI.  No dangerous behaviors.  No acute safety concerns.    OBJECTIVE  DATA REVIEWED (Consider medical labs, radiology, other medical tests; screening/outcome measures; psychological testing; discussion of test results with other clinicians; consultation with other clinicians and systems involved with patient, summary of old records): N/A    CURRENT MEDICATIONS (make clear medications prescribed by psychiatry; include OTC medications):  Current Outpatient Medications   Medication Sig    dicyclomine (BENTYL) 10 MG capsule take 1 capsule by mouth four times a day BEFORE MEALS AND NIGHTLY    Lancets Use 1 time daily.  Dispense lancets that are covered by insurance. 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    Blood Glucose Monitoring Suppl (GLUCOSE MONITORING KIT) monitoring kit Dispense glucometer that is covered by insurance. Dx Code: E11.29    fluticasone (FLONASE) 50 MCG/ACT nasal spray 1 spray by Each Nostril route daily    albuterol (PROVENTIL) (2.5 MG/3ML) 0.083% nebulizer solution Take 3 mLs by nebulization every 6 (six) hours as needed for Wheezing    lisinopril (PRINIVIL,ZESTRIL) 20 MG tablet take 1 tablet by mouth once daily    ONETOUCH DELICA LANCETS FINE MISC TEST once daily    metFORMIN (GLUCOPHAGE-XR) 500 MG 24 hr tablet take 4 tablets by mouth once daily WITH BREAKFAST    albuterol (PROVENTIL HFA,VENTOLIN HFA, PROAIR HFA) 108 (90 Base) MCG/ACT inhaler Inhale 2 puffs into the lungs every 6 (six) hours as needed for Wheezing or Shortness of breath    Omega-3 Fatty Acids (FISH OIL) 1000 MG CAPS Take by mouth    cholecalciferol (VITAMIN D3) 1000 UNIT tablet Take 1 tablet by mouth daily    pregabalin (LYRICA) 100 MG capsule Take 100 mg by mouth as needed With Dr Dwyane Dee         oxycodone-acetaminophen (PERCOCET) 5-325 MG per tablet Take 1 tablet by mouth 2 (two) times daily as  needed With Dr Dwyane Dee    aspirin 81 MG tablet Take 81 mg by mouth daily.     No current facility-administered medications for this visit.        MEDICATION ADHERENCE (including barriers and how addressed):No, side effects (headaches)    MEDICATION SIDE EFFECTS (Prescribers Only): Yes, venlafaxine - headaches    BIRTH CONTROL (ask females and males): No    CURRENT PREGNANCY: No                                    MENTAL STATUS EXAMINATION                     General Appearance: Within normal limits     Interaction with Interviewer (eye contact, attitude,  behavior): at patient baseline    Physical Signs    Gait and Station (how patient walks and stands): at patient baseline                  Physical Appearance: Within normal limits          Normal Movements: at patient baseline         Speech (rate, volume, articulation): at patient baseline          Language: at patient baseline                       Mood: "good"           Affect: full range           Thought Process     Rate; concrete vs abstract reasoning: Within normal limits        Logical vs illogical; associations: loose, tangential, circumstantial, intact: Within normal limits                                    Thought Content    Normal Thought Content (other than safety): Within normal limits     Perceptions (auditory, visual, tactile, etc.): Within normal limits       Impulse Control: Within normal limits         Cognition (Link to [facility])    Orientation (person, place, time): Within normal limits      Recent and remote memory: Within normal limits           Attention span and concentration: Within normal limits         Fund of knowledge, awareness of current events and vocabulary: Within normal limits      Judgment: Good        Insight: Fair       Suicidality/Homicidality/Aggression (Victimization or Perpetration): Denies SI, SA, SIB, HI, AVH, access to weapons/guns. No sxs consistent with psychosis, acute mania.       ASSESSMENT   Today's Assessment:  Reports stable mood.  DCF active with granddaughter (under pt's custody).  Psychosocial stressors - living arrangement, broken elevator. Pt lives on [address]th floor.  Exacerbates current physical/pain sxs.  Family member helping with moving groceries.  No dangerous behaviors.  No acute safety concerns.    Risk Level Assessment  Risk Level Change (if yes, please describe): No    Suicide: 1   Violence: 1   Addiction: 1    Protective Factors: Stable housing, income. Granddaughter. Engagement with providers and treatment. Pt is hopeful  and future-oriented.    DIAGNOSES  ASSESSED TODAY (psychiatric diagnoses and medical diagnoses that factor into management of psychiatric treatment):   Panic disorder  Tobacco use      CLINICAL FORMULATION (Make changes as your understanding changes. 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 Although pt has experienced mild anxiety sxs and a couple panic attacks in the past, pt shares that her panic attacks and ongoing anxiety are triggered by physiological sensations that trigger her into believing she is about to experience an uncomfortable GI episode. Pt states that she has been on venlafaxine in the past for her anxiety and panic, but self-discontinued it after being told by a Sister that it was "bad for [her]." Pt is educated extensively about potential benefits and side effects of the medication. She is also educated about potential ramifications of untreated medical and psychiatric conditions on her wellbeing. She is informed that it is important to weigh the benefits and potential risks when considering different interventions. Pt will require extensive psychopharm and behavioral health education. She is also encouraged to continue with individual therapy, which she agreeable with doing. She is agreeable with regular, consistent follow-up visits. No acute safety concerns (pt is NOT suicidal, homicidal, psychotic, or manic).     REVIEWING TODAY'S VISIT  CLINICAL INTERVENTIONS TODAY: Supportive therapy, Psychopharm education, Behavioral health education, Extensive conversation over treatment engagement and medication adherence across  different care providers, Coordination of Care.    PATIENT'S RESPONSE TO INTERVENTIONS: Engaged    PROGRESS TOWARDS GOALS: Positive    Estimated time spent in psychotherapy: N/A     PLAN  Psychopharmacology:  Fluoxetine 24m daily  -Discussed with patient, the common side effects related to starting antidepressants, including agitation, GI symptoms, headache, sexual side effects.   -Discussed discontinuation syndrome, if patient abruptly discontinues the medication.  -Discussed serotonin syndrome, if patient is on multiple serotonergic medications.  -Discussed risks of bleeding associated with serotonergic medications    Psychotherapy:  - Encouraged to continue with individual therapy    Medical:  - Continue to follow-up with PCP   - Appreciate PCP recs    Substances:  -  Monitor tobacco use.    Safety:   - Patient given my card with VM and # to [facility]. Voices understanding of crisis plan to call [facility], 911, or go to nearest emergency room if feeling unable to maintain her safety or the safety of those around her. No evidence of imminent risk of harm to self or others at this time.    Follow-up:   RTC 4 to 6 weeks    INFORMED CONSENT (for any new treatment): Patient was informed of the potential risks and benefits of the treatment, including the option not to treat, and appeared to understand and agreed to comply. Discussion included the following key points: potential benefits and side effects of medication regimen, care coordination among providers.      ONLY FOR PRESCRIBERS DOING EVALUATION AND MANAGEMENT VISITS     Use only when no psychotherapy performed  COUNSELING AND COORDINATION OF CARE PROVIDED (Consider diagnostic results/impressions and/or recommended studies; risks and benefits of treatment options; instruction for management/treatment and/or follow-up; importance of compliance with  chosen treatment options; risk factor reduction; patient/family/caregiver education; prognosis): Yes. Behavioral  health education. Psychopharm education. Care coordination among providers. Risk assessment & Safety planning.       Over 50% of time during today's visit was devoted to counseling and/or coordination of care.  If yes, record estimated duration of the face to face encounter.  Yes. Estimated Time: 25 mins    INSTRUCTIONS TO COVERING CLINICIANS:  OK to refill.        Melida Gimenez, MD, MPH  [facility]  pgr [phone]

## 2019-01-12 ENCOUNTER — Other Ambulatory Visit (HOSPITAL_BASED_OUTPATIENT_CLINIC_OR_DEPARTMENT_OTHER): Payer: Self-pay

## 2019-01-12 MED ORDER — PEG 3350-KCL-NABCB-NACL-NASULF 236 G PO SOLR: 4000 mL | mL | Freq: Once | ORAL | 0 refills | 0 days | Status: AC

## 2019-01-12 MED ORDER — PEG 3350-KCL-NABCB-NACL-NASULF 236 G PO SOLR
4000.00 mL | Freq: Once | ORAL | 0 refills | Status: AC
Start: 2019-01-12 — End: 2019-01-12

## 2019-01-12 NOTE — Telephone Encounter (Signed)
-----   Message from [facility] sent at 01/12/2019  9:50 AM EST -----  Regarding: Dr[facility]    Samantha Cameron [id number], 63 year old, female, Telephone Information:  Home Phone      [phone]  Work Phone      [phone]  Mobile          [phone]      Patient's Preferred Pharmacy:     [facility] [address], [place]  Phone: [phone] Fax: [phone]      CONFIRMED TODAY: Yes    CALL BACK NUMBER: [phone]  Best time to call back:   Cell phone:   Other phone:    Available times:    Patient's language of care: English    Patient does not need an interpreter.    Patient's PCP: Driscilla Grammes, MD    Person calling on behalf of patient: Patient (self)    Calls today with questions and concerns. Patient is having the colonoscopy on the February 27th, and she would like prescription to be sent to her [place]. Please let her know when it is sent.

## 2019-01-12 NOTE — Progress Notes (Signed)
Called the patient and reviewed the instructions.

## 2019-01-13 ENCOUNTER — Ambulatory Visit
Admission: RE | Admit: 2019-01-13 | Discharge: 2019-01-13 | Disposition: A | Discharge status: Home / Self Care | Payer: Medicare Other | Attending: Gastroenterology | Admitting: Gastroenterology

## 2019-01-13 ENCOUNTER — Other Ambulatory Visit (HOSPITAL_BASED_OUTPATIENT_CLINIC_OR_DEPARTMENT_OTHER): Payer: Self-pay | Admitting: Gastroenterology

## 2019-01-13 DIAGNOSIS — R1013 Epigastric pain: Secondary | ICD-10-CM | POA: Insufficient documentation

## 2019-01-14 LAB — HELICOBACTER PYLORI STOOL AG: HELICOBACTER PYLORI STOOL ANTIGEN: POSITIVE

## 2019-01-15 ENCOUNTER — Ambulatory Visit (HOSPITAL_BASED_OUTPATIENT_CLINIC_OR_DEPARTMENT_OTHER): Payer: Medicare Other | Admitting: Anesthesiology

## 2019-01-15 ENCOUNTER — Encounter (HOSPITAL_BASED_OUTPATIENT_CLINIC_OR_DEPARTMENT_OTHER): Payer: Self-pay

## 2019-01-15 ENCOUNTER — Ambulatory Visit
Admission: RE | Admit: 2019-01-15 | Discharge: 2019-01-15 | Disposition: A | Discharge status: Home / Self Care | Payer: Medicare Other | Attending: Gastroenterology | Admitting: Gastroenterology

## 2019-01-15 ENCOUNTER — Other Ambulatory Visit (HOSPITAL_BASED_OUTPATIENT_CLINIC_OR_DEPARTMENT_OTHER): Payer: Self-pay

## 2019-01-15 DIAGNOSIS — E785 Hyperlipidemia, unspecified: Secondary | ICD-10-CM | POA: Insufficient documentation

## 2019-01-15 DIAGNOSIS — E119 Type 2 diabetes mellitus without complications: Secondary | ICD-10-CM | POA: Diagnosis not present

## 2019-01-15 DIAGNOSIS — F329 Major depressive disorder, single episode, unspecified: Secondary | ICD-10-CM | POA: Insufficient documentation

## 2019-01-15 DIAGNOSIS — D125 Benign neoplasm of sigmoid colon: Secondary | ICD-10-CM | POA: Insufficient documentation

## 2019-01-15 DIAGNOSIS — R109 Unspecified abdominal pain: Secondary | ICD-10-CM | POA: Diagnosis present

## 2019-01-15 DIAGNOSIS — J449 Chronic obstructive pulmonary disease, unspecified: Secondary | ICD-10-CM | POA: Diagnosis not present

## 2019-01-15 DIAGNOSIS — Z7984 Long term (current) use of oral hypoglycemic drugs: Secondary | ICD-10-CM | POA: Insufficient documentation

## 2019-01-15 DIAGNOSIS — R634 Abnormal weight loss: Secondary | ICD-10-CM

## 2019-01-15 DIAGNOSIS — G4733 Obstructive sleep apnea (adult) (pediatric): Secondary | ICD-10-CM | POA: Diagnosis not present

## 2019-01-15 DIAGNOSIS — Z7982 Long term (current) use of aspirin: Secondary | ICD-10-CM | POA: Diagnosis not present

## 2019-01-15 DIAGNOSIS — K648 Other hemorrhoids: Secondary | ICD-10-CM | POA: Insufficient documentation

## 2019-01-15 DIAGNOSIS — K219 Gastro-esophageal reflux disease without esophagitis: Secondary | ICD-10-CM | POA: Diagnosis not present

## 2019-01-15 DIAGNOSIS — F419 Anxiety disorder, unspecified: Secondary | ICD-10-CM | POA: Insufficient documentation

## 2019-01-15 HISTORY — DX: Sleep apnea, unspecified: G47.30

## 2019-01-15 LAB — BLOOD SUGAR FINGERSTICK (POINT OF CARE): FINGERSTICK GLUCOSE: 94 mg/dl (ref 74–160)

## 2019-01-15 MED ORDER — SIMETHICONE 40 MG/0.6ML PO SUSP
ORAL | Status: DC
Start: 2019-01-15 — End: 2019-01-16
  Filled 2019-01-15: qty 0.6

## 2019-01-15 MED ORDER — LACTATED RINGERS IV BOLUS
INTRAVENOUS | Status: DC | PRN
Start: 2019-01-15 — End: 2019-01-15
  Administered 2019-01-15: 14:00:00 via INTRAVENOUS

## 2019-01-15 MED ORDER — PROPOFOL INFUSION
INTRAVENOUS | Status: AC
Start: 2019-01-15 — End: 2019-01-15
  Filled 2019-01-15: qty 50

## 2019-01-15 MED ORDER — LACTATED RINGERS IV SOLN
INTRAVENOUS | Status: DC
Start: 2019-01-15 — End: 2019-01-16
  Administered 2019-01-15: 14:00:00 via INTRAVENOUS

## 2019-01-15 MED ORDER — PROPOFOL 200 MG/20ML IV EMUL
Freq: Once | INTRAVENOUS | Status: DC | PRN
Start: 2019-01-15 — End: 2019-01-15
  Administered 2019-01-15: 20 mg via INTRAVENOUS
  Administered 2019-01-15: 50 mg via INTRAVENOUS

## 2019-01-15 MED ORDER — PROPOFOL INFUSION
INTRAVENOUS | Status: DC | PRN
Start: 2019-01-15 — End: 2019-01-15
  Administered 2019-01-15: 125 ug/kg/min via INTRAVENOUS

## 2019-01-15 NOTE — PROVATION-GI (Signed)
[facility]  Patient Name: Samantha Cameron  MRN: [id number]  CSN: [id number]  Date of Birth: 02-07-1956  Admit Type: Outpatient  Age: 63  Gender: Female  Note Status: Finalized  Patient Location: [facility]  Referring MD:         Driscilla Grammes, MD  Procedure Date:       01/15/2019 1:59:46 PM  Procedure:            Colonoscopy  Endoscopist:          Izora Gala, MD  Indications for Procedure:       Abdominal pain, Weight loss  Medications:          Propofol per Anesthesia  Procedure:       Just prior to the procedure, an updated history and physical was done. I        obtained an informed consent from the patient reviewing the risk of the        procedure including (but not limited to) respiratory depression, perforation,        bleeding, discomfort, a possible need for surgery and unexpected reactions to        medications. The patient is aware that test has limitations and may not        detect significant lesions such as cancer or other potential diseases. The        patient was also informed that they might need a repeat colonoscopy earlier        than standard guidelines if there are changes in their symptoms or concerning        findings noted. A time out was performed with the entire procedure staff        present. The scope was passed under direct vision. Throughout the procedure,        the patient's blood pressure, pulse, and oxygen saturations were monitored        continuously. The Colonoscope was introduced through the anus and advanced to        1 cm into the ileum. The scope was then slowly withdrawn with confirmation of        the noted findings. The colonoscopy was performed without difficulty. The        patient tolerated the procedure well. The quality of the bowel preparation        was good. Scope withdrawal time was 17 minutes.  Findings:       The perianal and digital rectal examinations were normal.       The rectum, recto-sigmoid colon, descending colon, splenic flexure,         transverse colon, hepatic flexure, ascending colon and cecum appeared normal.       Non-bleeding internal hemorrhoids were found during retroflexion. The        hemorrhoids were large.       Three sessile polyps were found in the sigmoid colon. The polyps were less        than 5 mm in size. These polyps were removed with a cold snare. Resection and        retrieval were complete.       An inked area was identified in the sigmoid colon/ desending colon. No        residual polyps seen in this area  Post Procedure Diagnosis:       - The rectum, recto-sigmoid colon, descending colon, splenic flexure,        transverse colon, hepatic flexure, ascending colon and  cecum are normal.       - Non-bleeding internal hemorrhoids.       - Three less than 5 mm polyps in the sigmoid colon, removed with a cold        snare. Resected and retrieved.  Complications:        No immediate complications. Estimated blood loss: Minimal.  Recommendation:       - Transfer the patient to the recovery area. Discharge the patient when the        patient meets discharge criteria. Please provide the patient a copy of the        procedure(s) report upon discharge for the patient records and to share with        Primary care provider.       - Dr Trinda Pascal will follow up on the results of the biopsies and will contact        you by mail or phone regarding the results. If you do not recieve the results        within 4 weeks, please contact us at [phone].       - You have hemorrhoids. I recommend high fiber diet. Fiber supplementation        with metamucil one scoope with 8 ounces of water once daily.       Topical hemorrhoidal supposatory twice daily for 14 days (anusol).       - Follow up with primary care provider, refer back to the gastroenterology        clinic for recurrence of symptoms ( rectal bleed, anemia or fecal occult        blood positive stool or any concerning symptoms).       - Timing on the next colonoscopy will depend on the  results of the biopsies  Dr. Wendall Mola, MD  01/15/2019 2:46:19 PM  Number of Addenda: 0  Note Initiated On: 01/15/2019 1:59 PM

## 2019-01-15 NOTE — Discharge Instructions (Signed)
Index Spanish Alllanguages Relatedtopics   Polyps in the Colon or Rectum     ________________________________________________________________________  KEY POINTS   Polyps are abnormal growths on the inner wall of the colon or rectum.   Polyps are removed when they are found, usually during a colonoscopy. If the polyps cannot be removed during a colonoscopy, you may need surgery to remove them.   Some polyps can turn into cancer if they are not removed early. Follow your provider's recommendations for tests and treatment.  ________________________________________________________________________  What are polyps in the colon or rectum?  Polyps are abnormal growths on the inner wall of the colon or rectum. The colon and rectum are sections of the large intestine, which is also called the large bowel. The colon is the last 5 feet of the large bowel. The rectum is the last few inches of the large bowel inside the anus, where your bowel movements come out.  Polyps are not harmful unless they turn into cancer. Not all polyps turn into cancer. When suspicious polyps are found early, they can be removed to prevent cancer.  What is the cause?  The cause of polyps is not known. Anyone can have polyps, but some people are more likely than others to get them. You are more likely to have polyps if:   You are over 50 years old.   You have had polyps in the past.   Someone in your family has had polyps.   Someone in your family has had colon or rectal cancer.   You have inflammatory bowel disease such as ulcerative colitis or Crohns disease.   You have obesity.   You smoke cigarettes.  What are the symptoms?  Polyps usually dont cause any symptoms. In some cases, they may cause:   Rectal bleeding or blood in bowel movements   Constipation or diarrhea   Pain in your belly  How are they diagnosed?  You should be examined for polyps when you reach the age of 42. You may need to be tested earlier than this if you  have a family history of colon cancer or polyps. One or more of the following tests may be done to look for polyps:   FOBT (fecal occult blood test) and FIT (fecal immunochemical test) are lab tests that look for hidden blood in your bowel movements (stool), which may be a sign of cancer. Your provider will give you test kits to use at home.   Flexible sigmoidoscopy uses a thin, flexible, lighted tube that is put into your rectum and lower part of your colon to look for cancer and polyps. Your healthcare provider can use the scope to remove polyps or other tissue.   Colonoscopy uses a longer scope to check your rectum and most or all of your colon. Your healthcare provider can use the scope to remove polyps or other tissue.   Double-contrast barium enema uses barium, which is a chalky liquid that shows up on X-rays, to check your rectum and colon. A tube is inserted into your rectum. Fluid containing barium is passed through the tube into your rectum and colon. Barium sticks to the walls and highlights them on the X-ray to show any abnormal areas. Other names for this test are air contrast barium enema, lower gastrointestinal (GI) exam, and lower GI.  How are they treated?  Polyps are removed when they are found, usually during a colonoscopy. If the polyp cannot be removed during a colonoscopy, you may need a laparoscopy  or laparotomy:   Laparoscopy uses a small lighted tube put into the belly through a small cut to look at the colon or rectum. Your provider may use the scope to guide other tools needed to remove the polyp.   A laparotomy is surgery that uses a larger cut in your belly to remove the part of the colon that contains the polyp.  Some polyps can turn into cancer if not removed early. You may get more polyps. Your provider may recommend that you have a colonoscopy 1 to 5 years after polyps are removed.  How can I take care of myself?   Follow your provider's recommendations for tests and  treatment.   Stay physically active as advised by your provider.   Eat low-fat, high-fiber foods.   Studies have shown that NSAIDs may help protect against colon and rectal cancer. Ask your healthcare provider if you should take NSAIDs and if so, how much to take.    Developed by [employer].  Adult Advisor 2019.4 published by [employer].  Last modified: 2017-12-13  Last reviewed: 2017-12-11  This content is reviewed periodically and is subject to change as new health information becomes available. The information is intended to inform and educate and is not a replacement for medical evaluation, advice, diagnosis or treatment by a healthcare professional.  References   Adult Advisor 2019.4 Index     2018 [place] and/or one of its subsidiaries       Colonoscopy, Adult, Care After  This sheet gives you information about how to care for yourself after your procedure. Your doctor may also give you more specific instructions. If you have problems or questions, call your doctor.  What can I expect after the procedure?  After the procedure, it is common to have:   A small amount of blood in your poop for 24 hours.   Some gas.   Mild cramping or bloating in your belly.  Follow these instructions at home:  General instructions   For the first 24 hours after the procedure:  ? Do not drive or use machinery.  ? Do not sign important documents.  ? Do not drink alcohol.  ? Do your daily activities more slowly than normal.  ? Eat foods that are soft and easy to digest.   Take over-the-counter or prescription medicines only as told by your doctor.  To help cramping and bloating:     Try walking around.   Put heat on your belly (abdomen) as told by your doctor. Use a heat source that your doctor recommends, such as a moist heat pack or a heating pad.  ? Put a towel between your skin and the heat source.  ? Leave the heat on for 20-30 minutes.  ? Remove the heat if your skin turns bright red.  This is especially important if you cannot feel pain, heat, or cold. You can get burned.  Eating and drinking     Drink enough fluid to keep your pee (urine) clear or pale yellow.   Return to your normal diet as told by your doctor. Avoid heavy or fried foods that are hard to digest.   Avoid drinking alcohol for as long as told by your doctor.  Contact a doctor if:   You have blood in your poop (stool) 2-3 days after the procedure.  Get help right away if:   You have more than a small amount of blood in your poop.   You see  large clumps of tissue (blood clots) in your poop.   Your belly is swollen.   You feel sick to your stomach (nauseous).   You throw up (vomit).   You have a fever.   You have belly pain that gets worse, and medicine does not help your pain.  Summary   After the procedure, it is common to have a small amount of blood in your poop. You may also have mild cramping and bloating in your belly.   For the first 24 hours after the procedure, do not drive or use machinery, do not sign important documents, and do not drink alcohol.   Get help right away if you have a lot of blood in your poop, feel sick to your stomach, have a fever, or have more belly pain.  This information is not intended to replace advice given to you by your health care provider. Make sure you discuss any questions you have with your health care provider.  Document Released: 12/08/2010 Document Revised: 09/05/2017 Document Reviewed: 07/30/2016  Elsevier Interactive Patient Education  2019 [employer].      Recommendation:       - Transfer the patient to the recovery area. Discharge the patient when the        patient meets discharge criteria. Please provide the patient a copy of the        procedure(s) report upon discharge for the patient records and to share with        Primary care provider.       - Dr Trinda Pascal will follow up on the results of the biopsies and will contact        you by mail or phone regarding the  results. If you do not recieve the results        within 4 weeks, please contact us at [phone].       - You have hemorrhoids. I recommend high fiber diet. Fiber supplementation        with metamucil one scoope with 8 ounces of water once daily.       Topical hemorrhoidal supposatory twice daily for 14 days (anusol).       - Follow up with primary care provider, refer back to the gastroenterology        clinic for recurrence of symptoms ( rectal bleed, anemia or fecal occult        blood positive stool or any concerning symptoms).       - Timing on the next colonoscopy will depend on the results of the biopsies  Dr. Wendall Mola, MD  01/15/2019 2:46:19 PM  Number of Addenda: 0  Note Initiated On: 01/15/2019 1:59 PM

## 2019-01-15 NOTE — Anesthesia Preprocedure Evaluation (Signed)
Pre-Anesthetic Note        Patient: Samantha Cameron is a 63 year old female      Procedure Information     Date/Time:  01/15/19 1400    Scheduled providers:  Izora Gala, MD    Procedure:  COLONOSCOPY    Diagnosis:  Abdominal discomfort [R10.9]    Location:  [facility] - Operating Room          Relevant Problems   No relevant active problems       Previous Anesthetic History:   Past Surgical History:  No date: ANES NERVE MUSC TENDON FASCIA&BURSA KNEE&/POPLT  No date: FOOT SURGERY      Comment:  bilateral  No date: LAPAROSCOPY SURG CHOLECYSTECTOMY  No date: OB ANTEPARTUM CARE CESAREAN DLVR & POSTPARTUM      Comment:  x3  No date: TONSILLECTOMY & ADENOIDECTOMY <AGE 61  04/13/2009: TOTAL KNEE REPLACEMENT      Comment:  left  08/11/2009: WRIST GANGLION EXCISION      Comment:  left     Current Medications:      Current Outpatient Medications:  albuterol (PROVENTIL) (2.5 MG/3ML) 0.083% nebulizer solution Take 3 mLs by nebulization every 6 (six) hours as needed for Wheezing Disp: 60 mL Rfl: 1   albuterol (PROVENTIL HFA,VENTOLIN HFA, PROAIR HFA) 108 (90 Base) MCG/ACT inhaler Inhale 2 puffs into the lungs every 6 (six) hours as needed for Wheezing or Shortness of breath Disp: 1 Inhaler Rfl: 11   aspirin 81 MG tablet Take 81 mg by mouth daily. Disp:  Rfl:    FLUoxetine (PROZAC) 20 MG capsule Take 1 capsule by mouth daily Disp: 30 capsule Rfl: 2   dicyclomine (BENTYL) 10 MG capsule take 1 capsule by mouth four times a day BEFORE MEALS AND NIGHTLY Disp: 120 capsule Rfl: 1   Lancets Use 1 time daily.  Dispense lancets that are covered by insurance. Dx e11.29 Disp: 100 each Rfl: 11   famotidine (PEPCID) 20 MG tablet take 1 tablet by mouth twice a day Disp: 60 tablet Rfl: 11   metoprolol (TOPROL-XL) 25 MG 24 hr tablet take 1 tablet by mouth once daily Disp: 90 tablet Rfl: 3   atorvastatin (LIPITOR) 20 MG tablet take 1 tablet by mouth once daily Disp: 90 tablet Rfl: 3   glucose blood (ONETOUCH VERIO) test strip TEST  once daily Disp: 100 strip Rfl: 11   glucose blood test strip by Finger Test or Other route daily Dispense test strips covered by insurance. Disp: 100 strip Rfl: 11   Blood Glucose Monitoring Suppl (GLUCOSE MONITORING KIT) monitoring kit Dispense glucometer that is covered by insurance. Dx Code: E11.29 Disp: 1 each Rfl: 0   fluticasone (FLONASE) 50 MCG/ACT nasal spray 1 spray by Each Nostril route daily Disp: 1 Bottle Rfl: 11   lisinopril (PRINIVIL,ZESTRIL) 20 MG tablet take 1 tablet by mouth once daily Disp: 90 tablet Rfl: 3   ONETOUCH DELICA LANCETS FINE MISC TEST once daily Disp: 100 each Rfl: 11   metFORMIN (GLUCOPHAGE-XR) 500 MG 24 hr tablet take 4 tablets by mouth once daily WITH BREAKFAST Disp: 360 tablet Rfl: 3   Omega-3 Fatty Acids (FISH OIL) 1000 MG CAPS Take by mouth Disp:  Rfl:    cholecalciferol (VITAMIN D3) 1000 UNIT tablet Take 1 tablet by mouth daily Disp:  Rfl:    pregabalin (LYRICA) 100 MG capsule Take 100 mg by mouth as needed With Dr Dwyane Dee  Disp:  Rfl:    oxycodone-acetaminophen (  PERCOCET) 5-325 MG per tablet Take 1 tablet by mouth 2 (two) times daily as needed With Dr Dwyane Dee Disp:  Rfl:      Current Facility-Administered Medications:  lactated ringers infusion  Intravenous Continuous Candice Camp, MD       Home Medications    (Not in a hospital admission)    Allergies:   Review of Patient's Allergies indicates:   Codeine camsylate       Shortness of Breath, Other (See                            Comments)    Comment:Chest pain   Motrin [ibuprofen]      Nausea Only   Sulfa antibiotics       Other (See Comments)   Bactrim                 Rash, Itching    Smoking, Alcohol, Drugs:  Social History    Tobacco Use      Smoking status: Former Smoker        Packs/day: 0.50        Years: 31.00        Pack years: 15.5        Types: Cigarettes        Quit date: 03/10/2018        Years since quitting: 0.8      Smokeless tobacco: Never Used      Tobacco comment: quit smoking inform provided to patient     Alcohol use: No      Drug use: No       PMHx:  Past Medical History:  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  1. GERD - severe 2.                Unintentional weight loss 3. Abdominal pain   Plan: 1.  Recommend EGD. This procedure has been fully reviewed                with the patient and written informed consent has been                obtained. 2.   No date: Esophageal reflux  No date: External hemorrhoid      Comment:  per steward records  04/19/2017: Gastritis without bleeding      Comment:  GI endoscopy: 06/2018: Mildly severe reflux esophagitis.               Biopsied.      - Chronic gastritis. Biopsied.      - No                gross lesions in the entire examined duodenum.                Complications:  No immediate complications.                Recommendation:      - Await pathology results. 7/2019GI:               Assessment: Pt is 66F with PMH COPD, morbid  obesity,                H.pylori (2009, unclear if treated) who p/w severe GERD                symptoms, unintentional weigh  05/10/2018: Gastroesophageal reflux disease without esophagitis  No date: HTN (hypertension)  02/01/2012: Hypoxia  02/16/2017: Left ear pain  06/07/2015: Left genital labial abscess  06/16/2015: Obesity, Class III, 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y) ([place])  08/16/2015: Osteopenia      Comment:  On xray of left knee per steward records 06/2014 Unclear                if addressed  05/23/2018: Pancreatic lesion  09/14/2015: Right ear impacted cerumen  No date: Sleep apnea  08/15/2015: Type 2 diabetes mellitus without complication, without   long-term current use of insulin ([facility])      Comment:   HEMOGLOBIN A1C (%) Date Value 07/15/2015 6.2 (H)                ----------  Steward record - a1c 6.2 on 01/31/15 6.4 on                09/30/14 5.9 on 12/29/13 6.4 08/18/13   11/18/2017: Viral URI with cough    Vitals  BP 139/71  Pulse 88  Temp 98.1 F (36.7 C) (Temporal)  Resp 20  Ht _0  (1.575 m)  Wt 97.1 kg (214 lb)  LMP 10/22/2007 (LMP Unknown)  SpO2 94%  BMI 39.14 kg/m2    ROS/MED HX    Physical Exam    General     Level of consciousness:  Alert and oriented (time, person, place)   Airway     Mallampati:  III    TM distance:  >3 FB    Mouth opening:  <3 FB    Neck ROM:  Mildly Limited   Teeth     Heart   - normal exam     Lungs - normal exam     Other findings: None loose per pt.  Full neck with smallish chin.              Pertinent Labs:   Lab Results   Component Value Date    NA  145 07/30/2018    K 3.9 07/30/2018    CREAT 0.7 07/30/2018    GLUCOSER 98 07/30/2018    WBC 9.4 07/30/2018    HCT 38.5 07/30/2018    PLTA 265 07/30/2018    PT 11.0 04/18/2017    APTT 30.5 04/18/2017    INR 1.0 04/18/2017         Anesthesia Plan    ASA Score:     ASA:  3    Airway:      Mallampati:  III    Mouth opening:  <3 FB    Neck ROM:  Mildly Limited    TM distance:  >3 FB    Plan: MAC    Other information:     EKG Reviewed: : Yes       Post-Plan::  PACU    Informed Consent:     Anesthetic plan and risks discussed with:  Patient

## 2019-01-15 NOTE — H&P (Signed)
Pre procedure note:  Interpreter needed: no    ASA: 2   Mallampati: 2     Procedure: colonoscopy    Procedure indication:abd pain and wt loss.   Chart reviewed: yes          Symptoms:   the abd pain resolved. the weight has been stable at 214 per Samantha Cameron.  since last seen         Prior scopes : EGD and colonoscopy   Pacemaker or defibrillator: no  Anticoagulation therapy: no  History of obstructive sleep apnea, O2 use, use of CPAP or BiPAP: no  Any previous problems with anesthesia: no  Allergies: Review of Patient's Allergies indicates:   Codeine camsylate       Shortness of Breath, Other (See                            Comments)    Comment:Chest pain   Motrin [ibuprofen]      Nausea Only   Sulfa antibiotics       Other (See Comments)   Bactrim                 Rash, Itching          HEMOGLOBIN (g/dL)   Date Value   07/30/2018 12.2       HEMATOCRIT (%)   Date Value   07/30/2018 38.5                 Other medical issues as the following:  Patient Active Problem List    Endometrial polyp         Date Noted: 11/18/2018      Panic disorder         Date Noted: 11/18/2018      Chest congestion         Date Noted: 11/18/2018      ASCUS of cervix with negative high risk HPV         Date Noted: 08/28/2018            Prior hx normal pap            Plan to repeat pap in one year       COMPLEX CARE MANAGEMENT-PRIMARY CARE BASED         Class: Active         Date Noted: 08/08/2018            Seabron Spates, RN, BSN, CCM            [email or web address]            Complex Care Manager            [facility]            [phone]            Cell and Page [phone]                  Fibroid         Date Noted: 08/04/2018            Had pelvic sono 07/2018            Uterus: Anteverted. Measures approximately 6.9 x            3.5 x 7.3 cm. The myometrium is heterogeneous. 2            adjacent posterior fundal fibroids measure  1.2 x 1.7 x 2.1 cm and 1.3 x 1.5 x 1.5 cm      Endometrial  thickening on ultrasound         Date Noted: 08/04/2018            EMB performed 9/19                         ENDOMETRIAL BIOPSY:             - NO HYPERPLASIA OR MALIGNANCY IDENTIFIED.             - EXCLUSIVELY FRAGMENTS OF BENIGN POLYP WITH MIXED            FEATURES OF             ENDOMETRIAL/ENDOCERVICAL             POLYP CONTAINING PROMINENT CYSTICALLY DILATED            GLANDS, FOCAL CHRONIC             INFLAMMATION, AND TUBAL             METAPLASIA.             - SCANT FRAGMENTS OF ENDOCERVICAL EPITHELIUM WITH            FOCAL TUBAL METAPLASIA.             - SCANT STRIPS OF ENDOMETRIAL EPITHELIUM.             - ABUNDANT MUCUS WITH FOCAL HISTIOCYTES.                         PLAN: given pt asxs without any PMP bleeding pt to            return should she experience any sxs of bleeding            /spotting and see MD for follow up.                   Abdominal pain         Date Noted: 07/28/2018      Unintentional weight loss         Date Noted: 07/23/2018            05/2018 GI:            Assessment:            Pt is 63F with PMH COPD, morbid obesity, H.pylori            (2009, unclear if treated) who p/w severe GERD            symptoms, unintentional weight loss (~20 lbs since            03/2018) and abdominal pain.                        1. GERD - severe            2. Unintentional weight loss            3. Abdominal pain                                    Plan:            1. Recommend EGD. This procedure has been fully  reviewed with the patient and written informed            consent has been obtained.            2. Increase omeprazole to 56m PO BID.             3. Pt aware that she is due for colonoscopy this            year. Concern about her ability to tolerate prep.            She will call if she changes her mind and feels            she can tolerate the prep without severe symptoms            as she is currently experiencing.            4. CT A/P with pancreatic protocol            5.  Follow-up after CT and EGD      Obstructive sleep apnea         Date Noted: 07/09/2018            10/2018: CPAP titration study limited due to low            sleep efficiency, however CPAP appeared effective.            CPAP 13-15 cm H20 recommended with medium full             face mask (resmed airfit used on study night).             Also + limb movements and hypoxia                        Recent overnight polysomnogram showed moderate            sleep apnea.Recommend CPAP titration.We have            entered an order-sleep lab FYI.      First degree AV block         Date Noted: 06/27/2018            Holter showed intermittent AV block during            sleeping hours.  Patient is scheduled for sleep            study.      Reflux esophagitis         Date Noted: 06/24/2018            GI endoscopy 06/23/2018: Mildly severe reflux            esophagitis. Biopsied.     - Chronic gastritis.            Biopsied.     - No gross lesions in the entire            examined duodenum.Complications:        No            immediate complications.            Recommendation:                 - Await pathology results.            05/2018 GI            Assessment:            Pt is 63F with PMH COPD, morbid obesity,  H.pylori            (2009, unclear if treated) who p/w severe GERD            symptoms, unintentional weight loss (~20 lbs since            03/2018) and abdominal pain.                        1. GERD - severe            2. Unintentional weight loss            3. Abdominal pain                                    Plan:            1. Recommend EGD. This procedure has been fully            reviewed with the patient and written informed            consent has been obtained.            2. Increase omeprazole to 32m PO BID.             3. Pt aware that she is due for colonoscopy this            year. Concern about her ability to tolerate prep.            She will call if she changes her mind and feels            she can tolerate  the prep without severe symptoms            as she is currently experiencing.            4. CT A/P with pancreatic protocol            5. Follow-up after CT and EGD                  Chronic gastritis         Date Noted: 05/23/2018      Common bile duct dilation         Date Noted: 05/23/2018            Likely normal postcholecystectomy      Fatty liver         Date Noted: 05/23/2018      Pancreatic lesion         Date Noted: 05/23/2018            Likely lipoma on MRCP      Vascular calcification         Date Noted: 014/78/2956     Umbilical hernia without obstruction and without gangrene         Date Noted: 05/23/2018      Arthritis of spine         Date Noted: 05/23/2018      Scoliosis of thoracic spine         Date Noted: 05/23/2018      Anxiety         Date Noted: 05/23/2018      Sleep disturbance         Date Noted: 05/23/2018      Viral URI         Date Noted: 11/18/2017  Leucocytosis      Seasonal allergic rhinitis         Date Noted: 04/18/2017      Left foot pain         Date Noted: 04/18/2017      Stress due to family tension         Date Noted: 04/18/2017      Acute bacterial sinusitis         Date Noted: 02/16/2017      Acute pain of left knee         Date Noted: 01/16/2017      Hx of total knee replacement, left         Date Noted: 01/16/2017      Insomnia         Date Noted: 08/20/2016      Vertigo         Date Noted: 06/26/2016      Orthostatic hypotension         Date Noted: 06/26/2016      Social problem         Date Noted: 06/26/2016      Right arm weakness         Date Noted: 06/26/2016            08/21/2016 started Physical Therapy      H/O bone density study         Date Noted: 01/27/2016      Abnormal PFTs         Date Noted: 12/20/2015      Screening for malignant neoplasm of breast         Date Noted: 12/20/2015      Skin irritation         Date Noted: 12/20/2015            Telederm 1/23 "Assessment:            63 year old woman referred via teledermatology            service for  consultation regarding 3 weeks of            irritation lateral to the R eyelid as well as skin            colored pedunculated papule. In the provided            photographs there is an ill defined pink macule            with overlying desquamative scale suggestive of a            mild eczematous dermatitis. The etiology of this            dermatitis is uncertain. The clinical differential            diagnosis for eyelid dermatitis includes a contact            dermatitis, atopic dermatitis, and less likely            etiologies such as a conjunctivitis. Allergic            contact dermatitis frequently manifests on the            eyelids where the skin is very thin. Common causes            of allergic contact dermatitis can include            manicured nails (glues, acrylic paints, adhesives,            lacquers),  cosmetic products, and perfumed.            Obtaining further history regarding any new            potentially triggering contactants can be helpful            to             potentially identify a culprit allergen.                         There is a skin colored papule lateral to the            canthus suggestive of a pedunculated skin tag.                        Recommendations:            - Given suspicion for eyelid dermatitis would            apply hydrocortisone cream 2.5% 1-2 times daily            for 5-7 days. Would review side effects of            inappropriate prolonged topical steroid use such            as glaucoma, cataracts,             and skin thinning            - Would obtain further history regarding recent            manicures or other recent newly used cosmetic            products which may be potential sources of            allergic             contact dermatitis            - Use gentle skin care products such as cetaphil            facial soap, or cerave             facial moisturizer             - In the event that symptoms persist please            consider repeat  teledermatology consult vs            referral for in person evaluation"      Acute pain of right wrist         Date Noted: 11/24/2015            X-rays 11/14/2015                                    History: Pain, limited range of motion.                          Findings: 3 views of the right wrist. No fracture            or dislocation.  Unremarkable carpal alignment.            Normal joint space. Normal soft              tissues.  Impression: No fracture or dislocation of the            right wrist.        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 ([place])         Date Noted: 09/14/2015      Type 2 diabetes mellitus with microalbuminuria, without long-term current use of insulin ([place])         Date Noted: 09/13/2015      Pineal gland cyst         Date Noted: 08/16/2015            Per [facility] records "stable"            04/03/13 - mild cystic changes in the pineal gland            without evidence of abnormal enlargement or            abnormal nodular enhancement - MRI brain per             [facility] records            09/12/09 - compared to 12/25/07 - unchanged pineal            gland cyst. Adenoidal             tissues now less prominent      History of vitamin D deficiency         Date Noted: 08/16/2015            per [facility] records      Hemorrhoids         Date Noted: 08/16/2015            Seen on colonoscopy 2004 per [facility] records            Due in 10 years      Venous (peripheral) insufficiency         Date Noted: 08/16/2015            Left leg per [facility] records 05/2013      Adrenal adenoma         Date Noted: 08/16/2015            03/2015 - [facility] - Dr Verdon Cummins - nl DHEAS, nl            aldosterone, nl renin, nl aldo/renin, low            metanephrines, low urine creatinine, nl free            cortisol, low             epinephrine, nl norepi, nl dopamine            Letter to patient reports suboptimnal urine            collection - too little volume              - uncertain if repeated                        07/01/14 - 1.2x0.8cm left adrenal adenoma per            [facility] records                        06/03/14 - [facility] CT A/P for possible stone            - 79m left adrenal nodule - not well characterized            -  punctate densities in region of distal ureters            likely phleboliths, much             less likely calculi      Skin change         Date Noted: 08/15/2015      Chronic bilateral low back pain with bilateral sciatica         Date Noted: 07/20/2015            [facility] [place]            Dr Dwyane Dee - 04/18/16 - tizanidine added, plan for            further RFR, f/u 4 weeks9/21/16 - also s/p left            total knee replacement. Had left l4, l5, l5-s1             RFR.  Continue rest of plan as previous                        07/13/15 - DDD of lumbar spine, lumbar facet            arthropathy, lumbosacral             radiculopathy, chronic L5 radiculopathy            Had procedure 06/29/15 - with good relief            Taking lyrica 124m qhs , percocet 5/3258mbid prn            Advised to stop smoking            Continue weight reduction, stretching,            strengthening, ice/heat, physical             activity            F/u 4 weeks                  Tobacco use disorder         Date Noted: 07/15/2015      Spinal stenosis of lumbar region         Date Noted: 07/15/2015      HLD (hyperlipidemia)         Date Noted: 07/15/2015                        CHOLESTEROL             Date Value             07/15/2015 162 mg/dL             02/08/2009 197 mg/dl             07/21/2006 111 mg/dl             ----------                        LOW DENSITY LIPOPROTEIN DIRECT             Date Value             07/15/2015 75 mg/dL             02/08/2009 111 mg/dl (H)             ----------  HIGH DENSITY LIPOPROTEIN             Date Value             07/15/2015 49 mg/dL             02/08/2009 38 mg/dl             ----------                         TRIGLYCERIDES (mg/dl)             Date Value             02/08/2009 189 (H)             ----------                        per [facility] records - 01/2015 - total 160, hdl, 45,            tg 236, ldl 68      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    FLUoxetine (PROZAC) 20 MG capsule Take 1 capsule by mouth daily    dicyclomine (BENTYL) 10 MG capsule take 1 capsule by mouth four times a day BEFORE MEALS AND NIGHTLY    Lancets Use 1 time daily.  Dispense lancets that are covered by insurance. Dx e11.29    famotidine (PEPCID) 20 MG tablet take 1 tablet by mouth twice a day    metoprolol (TOPROL-XL) 25 MG 24 hr tablet take 1 tablet by mouth once daily    atorvastatin (LIPITOR) 20 MG tablet take 1 tablet by mouth once daily    glucose blood (ONETOUCH VERIO) test strip TEST once daily    glucose blood test strip by Finger Test or Other route daily Dispense test strips covered by insurance.    Blood Glucose Monitoring Suppl (GLUCOSE MONITORING KIT) monitoring kit Dispense glucometer that is covered by insurance. Dx Code: E11.29    fluticasone (FLONASE) 50 MCG/ACT nasal spray 1 spray by Each Nostril route daily    lisinopril (PRINIVIL,ZESTRIL) 20 MG tablet take 1 tablet by mouth once daily    ONETOUCH DELICA LANCETS FINE MISC TEST once daily    metFORMIN (GLUCOPHAGE-XR) 500 MG 24 hr tablet take 4 tablets by mouth once daily WITH BREAKFAST    Omega-3 Fatty Acids (FISH OIL) 1000 MG CAPS Take by mouth    cholecalciferol  (VITAMIN D3) 1000 UNIT tablet Take 1 tablet by mouth daily    pregabalin (LYRICA) 100 MG capsule Take 100 mg by mouth as needed With Dr Dwyane Dee         oxycodone-acetaminophen (PERCOCET) 5-325 MG per tablet Take 1 tablet by mouth 2 (two) times daily as needed With Dr Dwyane Dee     Current Facility-Administered Medications   Medication    lactated ringers infusion     Review of Patient's Allergies indicates:  Codeine camsylate       Shortness of Breath, Other (See                            Comments)    Comment:Chest pain   Motrin [ibuprofen]      Nausea Only   Sulfa antibiotics       Other (See Comments)   Bactrim                 Rash, Itching       Vital signs   BP 139/71  Pulse 88  Temp 98.1 F (36.7 C) (Temporal)  Resp 20  Ht 5' 2"  (1.575 m)  Wt 97.1 kg (214 lb)  LMP 10/22/2007 (LMP Unknown)  SpO2 94%  BMI 39.14 kg/m2  Physical examination:   Neurologic: normal mental status, awake and alert, oriented, power and sensation   Chest: clear to auscultation bilaterally  Cardiovascular: regular rate and rhythm  Abdominal Exam: soft  Extremities: no C/C/E       Consent section   ######  Informed Consent for colonoscopy, , possible intervention. I did explain the procedure and used graphs to explain it to the patient. The indications, benefits, and alternatives were explained.      The risks of the procedure and any possible intervention were discussed at length. Risks include and limited to possible adverse reaction to the anesthetic, bleeding, perforation, missed lesion (including and not limited to polyps, tumor, cancer, diverticulosis, ulcers), infection, injury to other organs like the spleen.    The patient was also informed of possible discomfort and any potential impact on normal lifestyle and work activities.     Discussed the possibly that the procedure would be unsuccessful.      Discussed reasonable alternatives such as blood tests, imaging or doing nothing if applicable.     Discussed that if a complication  happens, this will need intervention, which include repeat scoping, surgical intervention (one or more), hospitalization (which can be very lengthy).     Discussed that occasionally other physicians may present to assist in any aspect of the procedure. I explained there is no guarantee of success.     Discussed the potential need for blood transfusion of there is bleeding.    The patient was allowed to pose questions and these were answered.    ####        Impression and Plan:    I have examined the patient, and performed pre-procedure and confirm that there are no significant changes since the examination documented above.    I find that the patient is still an appropriate candidate for the planned anesthetic / sedation and procedure. I have confirmed that the indications for procedure still exist.    Proceed with time-out    Izora Gala, MD  Gastroenterology Department   Pager [phone]

## 2019-01-15 NOTE — Anesthesia Postprocedure Evaluation (Signed)
Anesthesia Post-Operative Evaluation Note    Patient: Samantha Cameron           Procedure Summary     Date:  01/15/19 Room / Location:  [facility] - Operating Room    Anesthesia Start:  8127 Anesthesia Stop:  1440    Procedure:  COLONOSCOPY Diagnosis:  Abdominal discomfort    Scheduled Providers:  Izora Gala, MD Responsible Provider:  Candice Camp, MD    Anesthesia Type:  MAC ASA Status:  3            POST-OPERATIVE EVALUATION    Anesthesia Post Evaluation    Vitals signs in patient's normal range: Yes  Respiratory function stable; airway patent: Yes  Cardiovascular function stable: Yes  Hydration status stable: Yes  Mental status recovered; patient participates in evaluation and/or is at baseline: Yes  Pain control satisfactory: Yes  Nausea and vomiting control satisfactory: Yes    Procedure was labor & delivery no  PostOP disposition PACU  Anesthesia Observation no significant observation      Last vitals  BP   117/85 (01/15/19 1447)    Temp   97.6 F (36.4 C) (01/15/19 1445)    Pulse   80 (01/15/19 1447)   Resp   23 (01/15/19 1447)    SpO2   99 % (01/15/19 1447)

## 2019-01-18 ENCOUNTER — Other Ambulatory Visit (HOSPITAL_BASED_OUTPATIENT_CLINIC_OR_DEPARTMENT_OTHER): Payer: Self-pay | Admitting: Internal Medicine

## 2019-01-18 DIAGNOSIS — R809 Proteinuria, unspecified: Principal | ICD-10-CM

## 2019-01-18 DIAGNOSIS — E1129 Type 2 diabetes mellitus with other diabetic kidney complication: Secondary | ICD-10-CM

## 2019-01-18 NOTE — Progress Notes (Signed)
PER Pharmacy, Samantha Cameron is a 63 year old female has requested a refill of metformin      Last Office Visit: 11/05/2018 with pcp  Last Physical Exam: 08/15/2015    COLONOSCOPY due on 09/01/2018  DM EYE EXAM due on 09/12/2018      Other Med Adult:  Most Recent BP Reading(s)  01/15/19 : 136/73        Cholesterol (mg/dL)   Date Value   11/05/2018 143     LOW DENSITY LIPOPROTEIN DIRECT (mg/dL)   Date Value   11/05/2018 70     HIGH DENSITY LIPOPROTEIN (mg/dL)   Date Value   11/05/2018 46     TRIGLYCERIDES (mg/dL)   Date Value   11/05/2018 229 (H)         THYROID SCREEN TSH REFLEX FT4 (uIU/mL)   Date Value   05/14/2018 1.990         No results found for: TSH    HEMOGLOBIN A1C (%)   Date Value   11/05/2018 6.1 (H)       No results found for: POCA1C      INR (no units)   Date Value   04/18/2017 1.0   05/27/2016 1.0   02/01/2012 < 1.0 (L)       SODIUM (mmol/L)   Date Value   07/30/2018 145       POTASSIUM (mmol/L)   Date Value   07/30/2018 3.9           CREATININE (mg/dL)   Date Value   07/30/2018 0.7       Documented patient preferred pharmacies:    [facility] - [address], [place]  Phone: [phone] Fax: [phone]

## 2019-01-19 DIAGNOSIS — M47817 Spondylosis without myelopathy or radiculopathy, lumbosacral region: Secondary | ICD-10-CM | POA: Diagnosis not present

## 2019-01-20 LAB — SURGICAL PATH SPECIMEN GASTROINTESTINAL

## 2019-01-21 ENCOUNTER — Encounter (HOSPITAL_BASED_OUTPATIENT_CLINIC_OR_DEPARTMENT_OTHER): Payer: Self-pay | Admitting: Gastroenterology

## 2019-01-21 DIAGNOSIS — D126 Benign neoplasm of colon, unspecified: Secondary | ICD-10-CM | POA: Insufficient documentation

## 2019-01-21 DIAGNOSIS — Z79891 Long term (current) use of opiate analgesic: Secondary | ICD-10-CM | POA: Diagnosis not present

## 2019-01-21 DIAGNOSIS — M5416 Radiculopathy, lumbar region: Secondary | ICD-10-CM | POA: Diagnosis not present

## 2019-01-21 DIAGNOSIS — M47816 Spondylosis without myelopathy or radiculopathy, lumbar region: Secondary | ICD-10-CM | POA: Diagnosis not present

## 2019-01-28 ENCOUNTER — Ambulatory Visit (HOSPITAL_BASED_OUTPATIENT_CLINIC_OR_DEPARTMENT_OTHER): Payer: Medicare Other

## 2019-01-29 ENCOUNTER — Encounter (HOSPITAL_BASED_OUTPATIENT_CLINIC_OR_DEPARTMENT_OTHER): Payer: Self-pay | Admitting: Gastroenterology

## 2019-01-29 DIAGNOSIS — A048 Other specified bacterial intestinal infections: Secondary | ICD-10-CM

## 2019-01-29 MED ORDER — METRONIDAZOLE 250 MG PO TABS: 250 mg | tablet | Freq: Four times a day (QID) | ORAL | 0 refills | 0 days | Status: AC

## 2019-01-29 MED ORDER — OMEPRAZOLE 20 MG PO CPDR
20.00 mg | DELAYED_RELEASE_CAPSULE | Freq: Two times a day (BID) | ORAL | 0 refills | Status: AC
Start: 2019-01-29 — End: 2019-02-12

## 2019-01-29 MED ORDER — METRONIDAZOLE 250 MG PO TABS
250.00 mg | ORAL_TABLET | Freq: Four times a day (QID) | ORAL | 0 refills | Status: AC
Start: 2019-01-29 — End: 2019-02-12

## 2019-01-29 MED ORDER — BISMUTH SUBSALICYLATE 262 MG PO CHEW: 524 mg | tablet | Freq: Four times a day (QID) | ORAL | 0 refills | 0 days | Status: AC

## 2019-01-29 MED ORDER — TETRACYCLINE HCL 500 MG PO CAPS
500.00 mg | ORAL_CAPSULE | Freq: Four times a day (QID) | ORAL | 0 refills | Status: AC
Start: 2019-01-29 — End: 2019-02-12

## 2019-01-29 MED ORDER — OMEPRAZOLE 20 MG PO CPDR: 20 mg | capsule | Freq: Two times a day (BID) | ORAL | 0 refills | 0 days | Status: AC

## 2019-01-29 MED ORDER — BISMUTH SUBSALICYLATE 262 MG PO CHEW
524.00 mg | CHEWABLE_TABLET | Freq: Four times a day (QID) | ORAL | 0 refills | Status: AC
Start: 2019-01-29 — End: 2019-02-12

## 2019-01-29 MED ORDER — TETRACYCLINE HCL 500 MG PO CAPS: 500 mg | capsule | Freq: Four times a day (QID) | ORAL | 0 refills | 0 days | Status: AC

## 2019-01-30 ENCOUNTER — Telehealth (HOSPITAL_BASED_OUTPATIENT_CLINIC_OR_DEPARTMENT_OTHER): Payer: Self-pay | Admitting: Registered Nurse

## 2019-01-30 NOTE — Progress Notes (Signed)
CCM Encounter    Primary Purpose of Contact: Appointment reminder or follow-up  Individuals involved: Patient    Summary of discussion and interventions: writer contacted pt who was having time w/ family, requested call on Monday    CM Goals addressed: None    Assessment and Plan  Progress to date toward CM Goals: Not yet assessed    Care plan revised or updated: No    Next steps and plan: 02/02/19  Planned date of next contact: 02/02/19

## 2019-02-09 ENCOUNTER — Other Ambulatory Visit (HOSPITAL_BASED_OUTPATIENT_CLINIC_OR_DEPARTMENT_OTHER): Payer: Self-pay

## 2019-02-12 ENCOUNTER — Encounter (HOSPITAL_BASED_OUTPATIENT_CLINIC_OR_DEPARTMENT_OTHER): Payer: Self-pay | Admitting: Internal Medicine

## 2019-02-18 DIAGNOSIS — M47817 Spondylosis without myelopathy or radiculopathy, lumbosacral region: Secondary | ICD-10-CM | POA: Diagnosis not present

## 2019-02-18 DIAGNOSIS — M5136 Other intervertebral disc degeneration, lumbar region: Secondary | ICD-10-CM | POA: Diagnosis not present

## 2019-02-19 ENCOUNTER — Ambulatory Visit (HOSPITAL_BASED_OUTPATIENT_CLINIC_OR_DEPARTMENT_OTHER): Payer: Self-pay | Admitting: Ophthalmology

## 2019-02-19 ENCOUNTER — Telehealth (HOSPITAL_BASED_OUTPATIENT_CLINIC_OR_DEPARTMENT_OTHER): Payer: Self-pay

## 2019-02-19 NOTE — Progress Notes (Signed)
TW called patient to offer telehealth session for next scheduled visit on 4/8 at 11:30.  Pt states that she would like to wait for an in-person session to be seen and declined telehealth.  Pt was given front desk number at [facility] and instructed to call in the beginning May to inquire about scheduling an in-person session.

## 2019-02-25 ENCOUNTER — Ambulatory Visit (HOSPITAL_BASED_OUTPATIENT_CLINIC_OR_DEPARTMENT_OTHER): Payer: Medicare Other

## 2019-03-06 ENCOUNTER — Telehealth (HOSPITAL_BASED_OUTPATIENT_CLINIC_OR_DEPARTMENT_OTHER): Payer: Self-pay

## 2019-03-06 NOTE — Progress Notes (Signed)
[facility] staff reviewed and discussed with patient the following Covid19 teaching points, including prevention guidelines, resources needed to stay safe at home, and how and when to contact [facility] clinical services:    1. Covid 19 Prevention guidelines:   Stay home and limit how much other members of your household go out. Limit visitors. If you need to go out or have visitors, stay at least 6 feet away from other people.   Wash hands frequently with soap and water for 20 seconds (e.g. before eating or preparing food, after blowing nose or sneezing, before and after leaving home). Avoid touching eyes, nose, mouth, and face.    Cover coughs and sneezes with a tissue or sleeve. Throw away tissue and wash hands.    Clean commonly touched surfaces in your home (e.g. door knobs/handles, kitchen/ bathroom sinks, kitchen/dining table tops, bathroom surfaces, remote controls, and phones) at least once a day. Use alcohol cleaners (at least 60% alcohol), diluted bleach, hydrogen peroxide, or other approved cleaners.      Comments, barriers, plan re: prevention: none    2. Resources needed to stay at home:   A minimum 30 days supply of all medications: No concerns noted.   Available supports such as spouse, family members, friends, community agency (e.g. [employer]), other: son and daughter   Food and ways to obtain food: No concerns noted.   Immediate resource or financial concerns (immediate housing insecurity, ability to pay bills or buy needed supplies): No concerns noted.   Other needs identified: None   Plan to address needs: None  3. Accessing medical care and advice:  1. Primary care office phone access available 24/7. Call early with any new or increasing symptoms, including new fever, cough, shortness of breath or sore throat, runny nose, feeling tired, or you suddenly cant smell or taste anymore or worsening of current chronic medical condition(s) (give examples) or a new non-Covid illness. .   2. Review  of primary care phone number; where patient will post it: reviewed  3. Calling when symptoms first develop will help the medical team keep you out of the hospital.  4. Do not come to the clinic without calling first to speak with a nurse or doctor to discuss what to do next. Most provider visits are available over the phone, face-to-face visits if absolutely necessary.   5. Discussion of Importance of health care proxy and advanced directives:   Status of healthcare proxy: Proxy in place, but needs change - patient gave verbal consent to add another HCP - Kara Dies, son, [phone])      Advance directives if patient needs hospitalization "Do you know that you can give advanced directions to doctors and emergency workers on what types of treatments you would or wouldn't like if you became very sick and needed to be hospitalized? (Examples: whether you would want to go to the hospital or stay at home or  receive certain types of intensive care, or receive CPR.) You can change these directions at any time. Would you be interested in speaking to a doctor about your wishes?"            Pt already has directive in place   Additional Comments about advanced directives: none     6. Complex Care functions as a team. Role is focused on health and healthcare coordination to keep people healthy and safe at home - we wont have time to help with         other  needs right now. CM/H2H call back number given. (Active CCM/H2H pts will be called every 1-2 weeks to check in during this statewide home advisory period.)   7. For most needs call primary care.  However, if severe symptoms at any point, e.g. severe trouble breathing, chest pain or pressure, call 911. Let the operator know that you might have [place].  Teach back done. Pt understanding of teaching and discussion:  Pt verbalizes or demonstrates understanding     Additional questions, concerns, or barriers discussed: None     Additional teaching done or  interventions/resources offered: None    Follow-up needed:               Active CCM patient - next contact 1 month          COVID education completed: Yes     Narda Bonds) [place], [facility]  Office phone: [phone]  Cell Phone: [phone]

## 2019-03-13 ENCOUNTER — Other Ambulatory Visit (HOSPITAL_BASED_OUTPATIENT_CLINIC_OR_DEPARTMENT_OTHER): Payer: Self-pay | Admitting: Internal Medicine

## 2019-03-13 ENCOUNTER — Ambulatory Visit: Payer: Medicare Other | Attending: Psychiatry | Admitting: Psychiatry

## 2019-03-13 DIAGNOSIS — F410 Panic disorder [episodic paroxysmal anxiety] without agoraphobia: Secondary | ICD-10-CM | POA: Diagnosis not present

## 2019-03-13 DIAGNOSIS — Z720 Tobacco use: Secondary | ICD-10-CM | POA: Diagnosis not present

## 2019-03-13 DIAGNOSIS — R000 Tachycardia, unspecified: Secondary | ICD-10-CM

## 2019-03-13 MED ORDER — LISINOPRIL 20 MG PO TABS
20.0000 mg | ORAL_TABLET | Freq: Every day | ORAL | 3 refills | Status: DC
Start: 2019-03-13 — End: 2020-02-12

## 2019-03-13 MED ORDER — METOPROLOL SUCCINATE ER 25 MG PO TB24
25.0000 mg | ORAL_TABLET | Freq: Every day | ORAL | 3 refills | Status: DC
Start: 2019-03-13 — End: 2020-02-12

## 2019-03-13 MED ORDER — METOPROLOL SUCCINATE ER 25 MG PO TB24: 25 mg | tablet | Freq: Every day | ORAL | 3 refills | 0 days | Status: AC

## 2019-03-13 MED ORDER — LISINOPRIL 20 MG PO TABS: 20 mg | tablet | Freq: Every day | ORAL | 3 refills | 0 days | Status: AC

## 2019-03-15 MED ORDER — FLUOXETINE HCL 20 MG PO CAPS: 20 mg | capsule | Freq: Every day | ORAL | 2 refills | 0 days | Status: AC

## 2019-03-15 MED ORDER — FLUOXETINE HCL 20 MG PO CAPS
20.0000 mg | ORAL_CAPSULE | Freq: Every day | ORAL | 2 refills | Status: DC
Start: 2019-03-15 — End: 2020-06-29

## 2019-03-15 NOTE — Televisit Note (Signed)
This patient was identified as meeting criteria for a televisit rather than an in person visit due to public health concerns around COVID-19. A complete assessment and plan is detailed in the note, all of which were conducted remotely using telephone technology.  Patient identity was verbally confirmed by the patient/guardian with 2 identifiers (name, date of birth, and/or address) at the beginning of the visit  Patient/guardian verbally consented to care by televisit as appropriate.   Patient/guardian was located at home during the visit and confirmed that they understood they were encouraged to be in private location due to personal health information being discussed.  Patient/guardian was informed how to access face-to-face care in the event of an emergency.  Provider was located outside the office at a secure location during the visit.  If this is a new patient visit, all available records and medical history were reviewed by the provider.  Visit length was 25 minutes and counseling was done on the diagnoses indicated in the visit.    OUTPATIENT PSYCHIATRY PROGRESS NOTE    INTERPRETER: No    CONTACT INFO FOR OTHER AGENCIES AND MENTAL HEALTH PROVIDERS (IF APPLICABLE):   PCP: Osa Craver, MD ([place])  Individual Therapist: Mariann Laster, LICSW ([place])    PROBLEM(S) ADDRESSED IN THIS SESSION:   1. Depressive sxs  2. Anxiety sxs  3. Headaches, physical sxs affecting mood  4. Psychosocial stressors      SUBJECTIVE  TODAY'S CHIEF COMPLAINT AND CLINICAL UPDATES IN PATIENT'S WORDS:  1) Chief Complaint (Patient and/or guardian's own words, concerns and expressed thoughts):     "I'm doing okay despite all that is going on."    2) New information from patient and/or collateral (Patient's illness: context, course, modifying factors, severity, cultural, family, social, medical history):     Feels "okay."  Caring for herself.  We review COVID-19 pandemic information, including safety precautions.    Feels well-supported by  family.  Discusses carrying a routine during the day, trying to maintain sleep hygiene.    Smoke tobacco regularly.    Medications:  Medication adherent.  No side effects.    Safety:  - Currently denies urges to self-harm, passive SI, SI with intent/plan, SA, SIB, HI, AVH, access to weapons/guns.  - No sxs consistent with psychosis, acute mania.      OBJECTIVE  DATA REVIEWED (Consider medical labs, radiology, other medical tests; screening/outcome measures; psychological testing; discussion of test results with other clinicians; consultation with other clinicians and systems involved with patient, summary of old records): N/A    CURRENT MEDICATIONS (make clear medications prescribed by psychiatry; include OTC medications):  Current Outpatient Medications   Medication Sig    metoprolol (TOPROL-XL) 25 MG 24 hr tablet Take 1 tablet by mouth daily    lisinopril (ZESTRIL) 20 MG tablet Take 1 tablet by mouth daily    metFORMIN (GLUCOPHAGE-XR) 500 MG 24 hr tablet take 4 tablets by mouth once daily with BREAKFAST    FLUoxetine (PROZAC) 20 MG capsule Take 1 capsule by mouth daily    Lancets Use 1 time daily.  Dispense lancets that are covered by insurance. 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  Blood Glucose Monitoring Suppl (GLUCOSE MONITORING KIT) monitoring kit Dispense glucometer that is covered by insurance. Dx Code: E11.29    fluticasone (FLONASE) 50 MCG/ACT nasal spray 1 spray by Each Nostril route daily    albuterol (PROVENTIL) (2.5 MG/3ML) 0.083% nebulizer solution Take 3 mLs by nebulization every 6 (six) hours as needed for Wheezing    albuterol (PROVENTIL HFA,VENTOLIN HFA, PROAIR HFA) 108 (90 Base) MCG/ACT inhaler Inhale  2 puffs into the lungs every 6 (six) hours as needed for Wheezing or Shortness of breath    Omega-3 Fatty Acids (FISH OIL) 1000 MG CAPS Take by mouth    cholecalciferol (VITAMIN D3) 1000 UNIT tablet Take 1 tablet by mouth daily    pregabalin (LYRICA) 100 MG capsule Take 100 mg by mouth as needed With Dr Dwyane Dee         oxycodone-acetaminophen (PERCOCET) 5-325 MG per tablet Take 1 tablet by mouth 2 (two) times daily as needed With Dr Dwyane Dee    aspirin 81 MG tablet Take 81 mg by mouth daily.     No current facility-administered medications for this visit.        MEDICATION ADHERENCE (including barriers and how addressed): Yes    MEDICATION SIDE EFFECTS (Prescribers Only): Yes, venlafaxine - headaches    BIRTH CONTROL (ask females and males): No    CURRENT PREGNANCY: No                                    MENTAL STATUS EXAMINATION                     General Appearance: unable to assess via televisit     Interaction with Interviewer (eye contact, attitude, behavior): unable to assess via televisit    Physical Signs    Gait and Station (how patient walks and stands): unable to assess via televisit            Physical Appearance: unable to assess via televisit     Normal Movements: unable to assess via televisit         Speech (rate, volume, articulation): at patient baseline          Language: at patient baseline                       Mood: "I'm okay."           Affect: full range, unable to fully assess via televisit           Thought Process     Rate; concrete vs abstract reasoning: Within normal limits        Logical vs illogical; associations: loose, tangential, circumstantial, intact: Within normal limits                                    Thought Content    Normal Thought Content (other than safety): Within normal limits     Perceptions (auditory, visual, tactile, etc.): Within normal limits       Impulse Control: Within normal limits         Cognition (Link to [facility])    Orientation (person, place, time): Within  normal limits      Recent and remote memory: Within normal limits           Attention span and concentration: Within  normal limits         Fund of knowledge, awareness of current events and vocabulary: Within normal limits      Judgment: Good        Insight: Fair       Suicidality/Homicidality/Aggression (Victimization or Perpetration): Denies SI, SA, SIB, HI, AVH, access to weapons/guns. No sxs consistent with psychosis, acute mania.       ASSESSMENT   Today's Assessment:  Stable.  Medication adherent.  No dangerous behaviors.  No acute safety concerns.    Risk Level Assessment  Risk Level Change (if yes, please describe): No    Suicide: 1   Violence: 1   Addiction: 1    Protective Factors: Stable housing, income. Granddaughter. Engagement with providers and treatment. Pt is hopeful and future-oriented.    DIAGNOSES ASSESSED TODAY (psychiatric diagnoses and medical diagnoses that factor into management of psychiatric treatment):   Panic disorder  Tobacco use      CLINICAL FORMULATION (Make changes as your understanding changes. 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 Although pt has experienced mild anxiety sxs and a couple panic attacks in the past, pt shares that her panic attacks and ongoing anxiety are triggered by physiological sensations that trigger her into believing she is about to experience an uncomfortable GI episode. Pt states that she has been on venlafaxine in the past for her anxiety and panic, but self-discontinued it after being told by a Sister that it was "bad for [her]." Pt is  educated extensively about potential benefits and side effects of the medication. She is also educated about potential ramifications of untreated medical and psychiatric conditions on her wellbeing. She is informed that it is important to weigh the benefits and potential risks when considering different interventions. Pt will require extensive psychopharm and behavioral health education. She is also encouraged to continue with individual therapy, which she agreeable with doing. She is agreeable with regular, consistent follow-up visits. No acute safety concerns (pt is NOT suicidal, homicidal, psychotic, or manic).     REVIEWING TODAY'S VISIT  CLINICAL INTERVENTIONS TODAY: Supportive therapy, Psychopharm education, Behavioral health education, Extensive conversation over treatment engagement and medication adherence across different care providers, Coordination of Care.    PATIENT'S RESPONSE TO INTERVENTIONS: Engaged    PROGRESS TOWARDS GOALS: Positive    Estimated time spent in psychotherapy: N/A     PLAN  Psychopharmacology:  Fluoxetine 72m daily  -Discussed with patient, the common side effects related to starting antidepressants, including agitation, GI symptoms, headache, sexual side effects.   -Discussed discontinuation syndrome, if patient abruptly discontinues the medication.  -Discussed serotonin syndrome, if patient is on multiple serotonergic medications.  -Discussed risks of bleeding associated with serotonergic medications    Psychotherapy:  - Encouraged to continue with individual therapy    Medical:  - Continue to follow-up with PCP   - Appreciate PCP recs    Substances:  -  Monitor tobacco use.    Safety:   - Patient given my card with VM and # to P[facility]. Voices understanding of crisis plan to call [facility], 911, or go to nearest emergency room if feeling unable to maintain her safety or the safety  of those around her. No evidence of imminent risk of harm to self or others at this  time.    Follow-up:   4 to 6 weeks    INFORMED CONSENT (for any new treatment): Patient was informed of the potential risks and benefits of the treatment, including the option not to treat, and appeared to understand and agreed to comply. Discussion included the following key points: potential benefits and side effects of medication regimen, care coordination among providers.      ONLY FOR PRESCRIBERS DOING EVALUATION AND MANAGEMENT VISITS     Use only when no psychotherapy performed  COUNSELING AND COORDINATION OF CARE PROVIDED (Consider diagnostic results/impressions and/or recommended studies; risks and benefits of treatment options; instruction for management/treatment and/or follow-up; importance of compliance with chosen treatment options; risk factor reduction; patient/family/caregiver education; prognosis): Yes. Behavioral health education. Psychopharm education. Care coordination among providers. Risk assessment & Safety planning.       Over 50% of time during today's visit was devoted to counseling and/or coordination of care.  If yes, record estimated duration of the face to face encounter.  Yes. Estimated Time: 25 mins    INSTRUCTIONS TO COVERING CLINICIANS:  OK to refill.        Melida Gimenez, MD, MPH  [facility]  pgr [phone]

## 2019-03-18 DIAGNOSIS — M5136 Other intervertebral disc degeneration, lumbar region: Secondary | ICD-10-CM | POA: Diagnosis not present

## 2019-04-02 ENCOUNTER — Other Ambulatory Visit: Payer: Self-pay

## 2019-04-02 ENCOUNTER — Ambulatory Visit (HOSPITAL_BASED_OUTPATIENT_CLINIC_OR_DEPARTMENT_OTHER): Payer: Self-pay

## 2019-04-02 NOTE — Telephone Encounter (Signed)
Called and spoke with patient, she reports she has been having pain in her left ear for about 3 days, tried tylenol with little effect, no drainage from ear, feels like ear infection which she has had before. NO recent fevers, no cold sxs but has had some allergy sxs, runny nose for "months",  Has been taking sudafed.    Denies sore throat, sob, cough, n/v/diarrhea, abd or back pain. Denies sick contacts.    Televisit scheduled tomorrow.

## 2019-04-02 NOTE — Telephone Encounter (Signed)
Regarding: ear pain  ----- Message from Dory Larsen sent at 04/02/2019  2:10 PM EDT -----  Samantha Cameron [id number], 63 year old, female    Calls today:  Sick    What are the symptoms left ear pain  How long has patient been sick? Three days  Person calling on behalf of patient: Patient (self)    CALL BACK NUMBER: [phone]      Patient's language of care: English    Patient does not need an interpreter.    Patient's PCP: Driscilla Grammes, MD

## 2019-04-03 ENCOUNTER — Encounter (HOSPITAL_BASED_OUTPATIENT_CLINIC_OR_DEPARTMENT_OTHER): Payer: Self-pay | Admitting: Family Medicine

## 2019-04-03 ENCOUNTER — Ambulatory Visit: Payer: Medicare Other | Attending: Family Medicine | Admitting: Family Medicine

## 2019-04-03 DIAGNOSIS — R809 Proteinuria, unspecified: Secondary | ICD-10-CM | POA: Insufficient documentation

## 2019-04-03 DIAGNOSIS — H6502 Acute serous otitis media, left ear: Secondary | ICD-10-CM | POA: Diagnosis not present

## 2019-04-03 DIAGNOSIS — J449 Chronic obstructive pulmonary disease, unspecified: Secondary | ICD-10-CM | POA: Diagnosis not present

## 2019-04-03 DIAGNOSIS — F17200 Nicotine dependence, unspecified, uncomplicated: Secondary | ICD-10-CM | POA: Diagnosis present

## 2019-04-03 DIAGNOSIS — E1129 Type 2 diabetes mellitus with other diabetic kidney complication: Secondary | ICD-10-CM | POA: Insufficient documentation

## 2019-04-03 DIAGNOSIS — J4489 Other specified chronic obstructive pulmonary disease: Secondary | ICD-10-CM

## 2019-04-03 MED ORDER — AMOXICILLIN 500 MG PO CAPS: 500 mg | capsule | Freq: Three times a day (TID) | ORAL | 0 refills | 0 days | Status: AC

## 2019-04-03 MED ORDER — ALBUTEROL SULFATE (2.5 MG/3ML) 0.083% IN NEBU: 3 mg | mL | Freq: Four times a day (QID) | RESPIRATORY_TRACT | 1 refills | 0 days | Status: AC | PRN

## 2019-04-03 MED ORDER — AMOXICILLIN 500 MG PO CAPS
500.0000 mg | ORAL_CAPSULE | Freq: Three times a day (TID) | ORAL | 0 refills | Status: AC
Start: 2019-04-03 — End: 2019-04-10

## 2019-04-03 MED ORDER — ALBUTEROL SULFATE (2.5 MG/3ML) 0.083% IN NEBU
2.5000 mg | INHALATION_SOLUTION | Freq: Four times a day (QID) | RESPIRATORY_TRACT | 1 refills | Status: DC | PRN
Start: 2019-04-03 — End: 2019-05-05

## 2019-04-03 NOTE — Televisit Note (Signed)
This patient was identified as meeting criteria for a televisit rather than an in person visit due to public health concerns around COVID-19. A complete assessment and plan is detailed in the note, all of which were conducted remotely using telephone technology.  Patient identity was verbally confirmed by the patient/guardian with 2 identifiers (name, date of birth, and/or address) at the beginning of the visit  Patient/guardian verbally consented to care by televisit as appropriate.   Patient/guardian was located at home during the visit and confirmed that they understood they were encouraged to be in private location due to personal health information being discussed.  Patient/guardian was informed how to access face-to-face care in the event of an emergency.  Provider was located outside the office at a secure location during the visit.  If this is a new patient visit, all available records and medical history were reviewed by the provider.  Visit length was 10 minutes and counseling was done on the diagnoses indicated in the visit.      [phone]    Subjective     Samantha Cameron is a 63 year old female presents for left ear pain for 4 days. She denies any fevers or cough or sore throat.   Doesn't feel like ear is blocked, just painful.     She has had a lot of allergies. Hurts to touch the ear.    Not needing inhalers.    Checks sugars every morning fasting. The sugars have been fine 85, 86, this morning 91.          ROS: No fevers or weight loss. No headaches, double or blurry vision. No cough or shortness of breath. No chest pain or palpitations. No nausea, vomiting or abdominal pain. No dysuria, hematuria. No swelling in extremities. No rashes.    Social History     Socioeconomic History    Marital status: Divorced     Spouse name: Not on file    Number of children: Not on file    Years of education: Not on file    Highest education level: Not on file   Occupational History    Not on file   Social  Needs    Financial resource strain: Not on file    Food insecurity:     Worry: Not on file     Inability: Not on file    Transportation needs:     Medical: Not on file     Non-medical: Not on file   Tobacco Use    Smoking status: Former Smoker     Packs/day: 0.50     Years: 31.00     Pack years: 15.50     Types: Cigarettes     Last attempt to quit: 03/10/2018     Years since quitting: 1.0    Smokeless tobacco: Never Used    Tobacco comment: quit smoking inform provided to patient   Substance and Sexual Activity    Alcohol use: No    Drug use: No    Sexual activity: Not Currently     Partners: Male     Birth control/protection: Tubal Ligation   Lifestyle    Physical activity:     Days per week: Not on file     Minutes per session: Not on file    Stress: Not on file   Relationships    Social connections:     Talks on phone: Not on file     Gets together: Not on file  Attends religious service: Not on file     Active member of club or organization: Not on file     Attends meetings of clubs or organizations: Not on file     Relationship status: Not on file    Intimate partner violence:     Fear of current or ex partner: Not on file     Emotionally abused: Not on file     Physically abused: Not on file     Forced sexual activity: Not on file   Other Topics Concern    Not on file   Social History Narrative    Lives with son    1 daughter and another son passed away    Disabled from back    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) ([place])     Tobacco use disorder     Spinal stenosis of lumbar region     HLD (hyperlipidemia)     Chronic bilateral low back pain  with bilateral sciatica     Skin change     Pineal gland cyst     History of vitamin D deficiency     Internal hemorrhoids     Venous (peripheral) insufficiency     Adrenal adenoma     Type 2 diabetes mellitus with microalbuminuria, without long-term current use of insulin (HCC)     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 (HCC)     Acute pain of right wrist     Abnormal PFTs     Screening for malignant neoplasm of breast     Skin irritation     H/O bone density study     Vertigo     Orthostatic hypotension     Social problem     Right arm weakness     Insomnia     Acute pain of left knee     Hx of total knee replacement, left     Acute bacterial sinusitis     Seasonal allergic rhinitis     Left foot pain     Stress due to family tension     Leucocytosis     Viral URI     Chronic gastritis     Common bile duct dilation     Fatty liver     Pancreatic lesion     Vascular calcification     Umbilical hernia without obstruction and without gangrene     Arthritis of spine     Scoliosis of thoracic spine     Anxiety     Sleep disturbance     Reflux esophagitis     First degree AV block     Obstructive sleep apnea     Unintentional weight loss     Abdominal pain     Fibroid     Endometrial thickening on ultrasound      COMPLEX CARE MANAGEMENT-PRIMARY CARE BASED     ASCUS of cervix with negative high risk HPV     Endometrial polyp  Panic disorder     Chest congestion     Adenomatous polyp of colon    Past Surgical History:  No date: ANES NERVE MUSC TENDON FASCIA&BURSA KNEE&/POPLT  No date: FOOT SURGERY      Comment:  bilateral  No date: LAPAROSCOPY SURG CHOLECYSTECTOMY  No date: OB ANTEPARTUM CARE CESAREAN DLVR & POSTPARTUM      Comment:  x3  No date: TONSILLECTOMY & ADENOIDECTOMY <AGE 46  04/13/2009: TOTAL KNEE REPLACEMENT      Comment:  left  08/11/2009: WRIST GANGLION EXCISION      Comment:  left   Review of patient's family history indicates:  Problem: Diabetes      Relation: Mother          Age of Onset: (Not  Specified)  Problem: Heart      Relation: Brother          Age of Onset: (Not Specified)          Comment: CAD and AAA  Problem: Hypertension      Relation: Mother          Age of Onset: (Not Specified)  Problem: Arthritis      Relation: Mother          Age of Onset: (Not Specified)  Problem: Stroke      Relation: Mother          Age of Onset: (Not Specified)  Problem: Diabetes      Relation: Brother          Age of Onset: (Not Specified)          Comment: same brother  Problem: Cancer - Other      Relation: Father          Age of Onset: (Not Specified)          Comment: type uncertain  Problem: Thyroid      Relation: Sister          Age of Onset: (Not Specified)  Problem: Alcohol/Drug Abuse      Relation: Son          Age of Onset: (Not Specified)  Problem: OTHER      Relation: Son          Age of Onset: (Not Specified)          Comment: ?brain aneurysm per steward records  Problem: No Known Problems      Relation: Grandchild          Age of Onset: (Not Specified)          Comment: 6  Problem: Cancer - Breast      Relation: FamHxNeg          Age of Onset: (Not Specified)  Problem: Cancer - Cervical      Relation: FamHxNeg          Age of Onset: (Not Specified)  Problem: Cancer - Ovarian      Relation: FamHxNeg          Age of Onset: (Not Specified)  Problem: Cancer - Colon      Relation: FamHxNeg          Age of Onset: (Not Specified)  Problem: Psychiatric Illness      Relation: FamHxNeg          Age of Onset: (Not Specified)      Current Outpatient Medications:  FLUoxetine (PROZAC) 20 MG capsule Take 1 capsule by mouth daily Disp: 30 capsule Rfl: 2   metoprolol (  TOPROL-XL) 25 MG 24 hr tablet Take 1 tablet by mouth daily Disp: 90 tablet Rfl: 3   lisinopril (ZESTRIL) 20 MG tablet Take 1 tablet by mouth daily Disp: 90 tablet Rfl: 3   metFORMIN (GLUCOPHAGE-XR) 500 MG 24 hr tablet take 4 tablets by mouth once daily with BREAKFAST Disp: 360 tablet Rfl: 3   Lancets Use 1 time daily.  Dispense lancets that are covered by  insurance. Dx e11.29 Disp: 100 each Rfl: 11   famotidine (PEPCID) 20 MG tablet take 1 tablet by mouth twice a day Disp: 60 tablet Rfl: 11   atorvastatin (LIPITOR) 20 MG tablet take 1 tablet by mouth once daily Disp: 90 tablet Rfl: 3   glucose blood (ONETOUCH VERIO) test strip TEST once daily Disp: 100 strip Rfl: 11   glucose blood test strip by Finger Test or Other route daily Dispense test strips covered by insurance. Disp: 100 strip Rfl: 11   Blood Glucose Monitoring Suppl (GLUCOSE MONITORING KIT) monitoring kit Dispense glucometer that is covered by insurance. Dx Code: E11.29 Disp: 1 each Rfl: 0   fluticasone (FLONASE) 50 MCG/ACT nasal spray 1 spray by Each Nostril route daily Disp: 1 Bottle Rfl: 11   albuterol (PROVENTIL) (2.5 MG/3ML) 0.083% nebulizer solution Take 3 mLs by nebulization every 6 (six) hours as needed for Wheezing Disp: 60 mL Rfl: 1   albuterol (PROVENTIL HFA,VENTOLIN HFA, PROAIR HFA) 108 (90 Base) MCG/ACT inhaler Inhale 2 puffs into the lungs every 6 (six) hours as needed for Wheezing or Shortness of breath Disp: 1 Inhaler Rfl: 11   Omega-3 Fatty Acids (FISH OIL) 1000 MG CAPS Take by mouth Disp:  Rfl:    cholecalciferol (VITAMIN D3) 1000 UNIT tablet Take 1 tablet by mouth daily Disp:  Rfl:    pregabalin (LYRICA) 100 MG capsule Take 100 mg by mouth as needed With Dr Dwyane Dee  Disp:  Rfl:    oxycodone-acetaminophen (PERCOCET) 5-325 MG per tablet Take 1 tablet by mouth 2 (two) times daily as needed With Dr Dwyane Dee Disp:  Rfl:    aspirin 81 MG tablet Take 81 mg by mouth daily. Disp:  Rfl:      No current facility-administered medications for this visit.   Review of Patient's Allergies indicates:   Codeine camsylate       Shortness of Breath, Other (See                            Comments)    Comment:Chest pain   Motrin [ibuprofen]      Nausea Only   Sulfa antibiotics       Other (See Comments)   Bactrim                 Rash, Itching           Objective     LMP 10/22/2007 (LMP Unknown)       Physical  Exam:  Gen: No acute distress  Psych: cooperative pleasant  Pulm: no respiratory distress        Assessment   Assessment and Plan:    1. Non-recurrent acute serous otitis media of left ear  AMOX 570m TID x 7d  Call back if worsening signs or symptoms    2. 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 Pt is also not on any COPD controler meds     3. Type 2 diabetes mellitus  with microalbuminuria, without long-term current use of insulin (HCC)  Well controlled based on last A1c and fasting sugars, continue current management.     4. Tobacco use disorder  Counseled pt re: tobacco, risks of chronic use and benefits of quitting, pt is precontemplative.                 [name], MD

## 2019-04-06 ENCOUNTER — Telehealth (HOSPITAL_BASED_OUTPATIENT_CLINIC_OR_DEPARTMENT_OTHER): Payer: Self-pay

## 2019-04-06 NOTE — Progress Notes (Signed)
CCM Encounter    Primary Purpose of Contact: Coordination of care  Individuals involved: Patient    Summary of discussion and interventions:   T/c to patient as CCM 30 day follow up. Patient answered phone, states that she's doing "ok" her ear pain is better with the antibiotic she was given. She is staying home and when she goes out wears a face mask and uses hand sanitizer.     She states that her FBG, which she takes daily in the mornings has been good, numbers 86.92,98, 85.     Asks about PCP appt to address cpap compliance issue (only wearing 3.5 hours/night and insurance will not pay for it)  CM Goals addressed: Covid -19 transmission precautions, managing blood sugar levels.    Assessment and Plan  Progress to date toward CM Goals: good    Care plan revised or updated: Yes    Next steps and plan: request PCP appt to discuss Cpap machine  Planned date of next contact: as needed and within 30 days    Burnard Hawthorne, [facility]   [place] [facility]  Tel/pager [phone]

## 2019-04-06 NOTE — Progress Notes (Signed)
Per CCM request I called the patient and left a voice message. Patient can schedule televisit with pcp.

## 2019-04-15 DIAGNOSIS — M5136 Other intervertebral disc degeneration, lumbar region: Secondary | ICD-10-CM | POA: Diagnosis not present

## 2019-04-15 DIAGNOSIS — Z79891 Long term (current) use of opiate analgesic: Secondary | ICD-10-CM | POA: Diagnosis not present

## 2019-04-22 DIAGNOSIS — M47816 Spondylosis without myelopathy or radiculopathy, lumbar region: Secondary | ICD-10-CM | POA: Diagnosis not present

## 2019-04-22 DIAGNOSIS — F40231 Fear of injections and transfusions: Secondary | ICD-10-CM | POA: Diagnosis not present

## 2019-05-05 ENCOUNTER — Telehealth: Payer: Self-pay

## 2019-05-05 ENCOUNTER — Other Ambulatory Visit (HOSPITAL_BASED_OUTPATIENT_CLINIC_OR_DEPARTMENT_OTHER): Payer: Self-pay

## 2019-05-05 ENCOUNTER — Telehealth (HOSPITAL_BASED_OUTPATIENT_CLINIC_OR_DEPARTMENT_OTHER): Payer: Self-pay

## 2019-05-05 MED ORDER — ALBUTEROL SULFATE (2.5 MG/3ML) 0.083% IN NEBU
2.5000 mg | INHALATION_SOLUTION | Freq: Four times a day (QID) | RESPIRATORY_TRACT | 1 refills | Status: DC | PRN
Start: 2019-05-05 — End: 2019-07-22

## 2019-05-05 NOTE — Progress Notes (Signed)
Called pharmacy back, they are working on filling Rx for albuterol solution (from last month), due to patient's insurance they need a new Rx with an ICD 10 code on it.    Also they cannot dispense 60 ml due to the way it is packaged, they can dispense 75 ml or 90 ml.

## 2019-05-05 NOTE — Telephone Encounter (Signed)
-----   Message from Ardeth Sportsman sent at 05/05/2019  1:41 PM EDT -----  Regarding: pharmacy - issue with med   Contact: [phone]  No patient name on file. No patient ID available, No DOB on File., adult    Calls today:  Clinical Questions (NON-SICK CLINICAL QUESTIONS ONLY)    Name of person calling [place] - ask forJoe or Mechele Claude   Specific nature of request issue with Rx that was sent over please call pharmacy to assist  Return phone number [phone]  Person calling on behalf of patient: Pharmacy     Rod Can NUMBER: [phone]  Best time to call back: today   Cell phone:   Other phone:    Patient's language of care: Data Unavailable    Patient does not need an interpreter.    Patient's PCP: No primary care provider on file.

## 2019-05-05 NOTE — Progress Notes (Signed)
CCM Discharge    1. Duration of CCM: 9 months - patient had been referred to CCM by [facility] due to increased IP/ED utilization.    2. Intensity (average) of CCM intervention: monthly contact    3. Focus of CCM intervention: Reduction of ED/IP utilization and Disease and self-management Improvement    4. Reason for Resolving CCM: All CM goals met    5a. Care Manager's evaluation of patient's engagement in CCM: Partially engaged    5b. Care Manager's evaluation of progress towards Care Management goals: Met  Outcomes achieved: ED/IP utilization reduced    6. Follow-up plan/goals of care post-CCM discharge and responsible care coordination/care management team contacts: PCP Osa Craver    Please note future team contact information is also documented in patient's My Care Plan.    CCM Problem or Episode of Care resolved/Name removed from Care Team 05/05/2019    Additional comments: n/a    Thank you for the opportunity to work with this patient. Please discuss future care coordination needs with the team noted above (#6). Patient may also be re-referred to [facility] in the future if his/her needs or status change.

## 2019-05-05 NOTE — Progress Notes (Signed)
CCM Encounter    Primary Purpose of Contact: Review care plan/goals of care and Coordination of care  Individuals involved: Patient    Summary of discussion and interventions: Pt reported she is doing fine." I am just dealing with pain in my sciatic left side. I am taking tylenol for pain and it is helping. I just can't walk around to much. I am also usin ice patches."    Granddaughter and her son - They help with cleaning and groceries shopping     Medications - reported taking all her meds every day.    Blood Sugar - Pt reported fasting blood sugar around 106 - 90 -89 - last A1C was 12/18 = 6.1%    CPAP Machine - "I am using it for 3 hours. I can't use for more than 3 hours. My mouth gets dry and I can't handle it."  CM reinforced that Pt needs to address with provider as health insurance will not pay for CPAP Machine if Pt is not in compliance.   Pt reported that she will address problem on upcoming appt with:    05/07/2019 3:00 PM [facility]       Depression and anxiety - Pt is engage with Psychiatry Dr Juliane Lack    Smoking - not ready to stop     Assessment and Plan  CM Goals addressed and Progress to date toward CM Goals:     - [place]  - Decrease Emergency Room Visits - last ED visit was 07/30/18 - 05/05/19   - Assess Medication Compliance Especially [place] - taking all her meds -  05/05/19  - Ensure that patient aware of COVID-19 transmission precautions - COVID education completed: Yes done on 03/06/19    Care plan revised or updated: Yes    Next steps and plan: Review CCM Assessment and Goals  Planned date of next contact: monthly       Aurea Graff, [place], [place]  [phone] cell

## 2019-05-07 ENCOUNTER — Ambulatory Visit: Payer: Medicare Other | Attending: Internal Medicine | Admitting: Family

## 2019-05-07 DIAGNOSIS — G4733 Obstructive sleep apnea (adult) (pediatric): Secondary | ICD-10-CM | POA: Diagnosis not present

## 2019-05-07 DIAGNOSIS — G2581 Restless legs syndrome: Secondary | ICD-10-CM | POA: Diagnosis not present

## 2019-05-07 NOTE — Progress Notes (Signed)
Reason forTelevVisit     OSA, non compliance with CPAP    HPI  This is a 63 year old female who presents for CPAP follow-up.  She underwent a PSG last year which showed overall moderate OSA (AHI 20,sat nadir 79%), severe in supine position AHI 57. REM sleep was not observed. The sleep study also demonstrated PLM.  Pt was prescribed CPAP which she received in 12/2018. She reports difficulties tolerating the CPAP.  The  longest she is able to keep the machine on is about 2-3 hours. She reports she is unable to use the machine for much longer. She was not able to elaborate the reason why she is able to use the machine, she is simply not comfortable when she has the mask on her face. In terms of her PLM noted she admits When asked about leg movement she admits leg movements, cramps most nights.     Review of System  C/o fatigue, headache; no dizziness, chest pain or palpitations, cough, +DOE. No abdominal discomfort, diarrhea, constipation, bleeding; no seizures, joint pain; no hematuria, dysuria or swelling.    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) ([place])     Tobacco use disorder     Spinal stenosis of lumbar region     HLD (hyperlipidemia)     Chronic bilateral low back pain with bilateral sciatica      Venous (peripheral) insufficiency     Adrenal adenoma     Type 2 diabetes mellitus with microalbuminuria, without long-term current use of insulin (HCC)     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 (HCC)     Vertigo     Orthostatic hypotension     Social problem     Insomnia     Hx of total knee replacement, left     Seasonal allergic rhinitis    Vascular calcification     Arthritis of spine     Scoliosis of thoracic spine     Anxiety     Sleep disturbance     First degree AV block     Obstructive sleep apnea     Panic disorder    Current Outpatient Medications   Medication Sig    albuterol (PROVENTIL) (2.5 MG/3ML) 0.083% nebulizer  solution Take 3 mLs by nebulization every 6 (six) hours as needed for Wheezing Dx: Code: J44.9    FLUoxetine (PROZAC) 20 MG capsule Take 1 capsule by mouth daily    metoprolol (TOPROL-XL) 25 MG 24 hr tablet Take 1 tablet by mouth daily    lisinopril (ZESTRIL) 20 MG tablet Take 1 tablet by mouth daily    metFORMIN (GLUCOPHAGE-XR) 500 MG 24 hr tablet take 4 tablets by mouth once daily with BREAKFAST    Lancets Use 1 time daily.  Dispense lancets that are covered by insurance. 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    Blood Glucose Monitoring Suppl (GLUCOSE MONITORING KIT) monitoring kit Dispense glucometer that is covered by insurance. Dx Code: E11.29    fluticasone (FLONASE) 50 MCG/ACT nasal spray 1 spray by Each Nostril route daily    albuterol (PROVENTIL HFA,VENTOLIN  HFA, PROAIR HFA) 108 (90 Base) MCG/ACT inhaler Inhale 2 puffs into the lungs every 6 (six) hours as needed for Wheezing or Shortness of breath    Omega-3 Fatty Acids (FISH OIL) 1000 MG CAPS Take by mouth    cholecalciferol (VITAMIN D3) 1000 UNIT tablet Take 1 tablet by mouth daily    pregabalin (LYRICA) 100 MG capsule Take 100 mg by mouth as needed With Dr Dwyane Dee         oxycodone-acetaminophen (PERCOCET) 5-325 MG per tablet Take 1 tablet by mouth 2 (two) times daily as needed With Dr Dwyane Dee    aspirin 81 MG tablet Take 81 mg by mouth daily.     Assessment/ Plan  OSA: she has moderate OSA, diagnosed in 06/2018. She started on CPAP in 12/2018. She reports difficulties tolerating the CPAP.  She used the machine a few times at the beginning but only tried it couple times during the last 30 days. Her compliance report on [facility] is 0%. She did not meet the initial  compliance requirement. Patient made aware that she could be referred for mask refit if the mask is an issue.  Advised her to use the machine as much as she can to see if she can get used to it.  I went over the results of her sleep study in detail, educated her about OSA and the cardiovascular risk associated with it. We also discussed the relationship between comorbidities, of particular relevance in her case her hypertension, diabetes. Pt is made aware that if her compliance does not improve she may be asked to return the machine as it would not be covered by her insurance.    RLS: prominent Limb movements during wake and sleep were noted during her sleep study. Would recommend reassessing symptoms once she becomes compliant with the CPAP. Would also suggest check her ferritin to ensure it is >50 given high burden of limb movement.    Zyah Gomm Antony Contras, NP  05/07/2019

## 2019-05-13 DIAGNOSIS — M5136 Other intervertebral disc degeneration, lumbar region: Secondary | ICD-10-CM | POA: Diagnosis not present

## 2019-05-21 ENCOUNTER — Encounter (HOSPITAL_BASED_OUTPATIENT_CLINIC_OR_DEPARTMENT_OTHER): Payer: Self-pay | Admitting: Psychiatry

## 2019-05-28 ENCOUNTER — Encounter (HOSPITAL_BASED_OUTPATIENT_CLINIC_OR_DEPARTMENT_OTHER): Payer: Self-pay

## 2019-06-10 DIAGNOSIS — M47817 Spondylosis without myelopathy or radiculopathy, lumbosacral region: Secondary | ICD-10-CM | POA: Diagnosis not present

## 2019-06-10 DIAGNOSIS — M5417 Radiculopathy, lumbosacral region: Secondary | ICD-10-CM | POA: Diagnosis not present

## 2019-06-10 DIAGNOSIS — Z79891 Long term (current) use of opiate analgesic: Secondary | ICD-10-CM | POA: Diagnosis not present

## 2019-07-08 DIAGNOSIS — Z79891 Long term (current) use of opiate analgesic: Secondary | ICD-10-CM | POA: Diagnosis not present

## 2019-07-08 DIAGNOSIS — M47817 Spondylosis without myelopathy or radiculopathy, lumbosacral region: Secondary | ICD-10-CM | POA: Diagnosis not present

## 2019-07-08 DIAGNOSIS — M5417 Radiculopathy, lumbosacral region: Secondary | ICD-10-CM | POA: Diagnosis not present

## 2019-07-08 DIAGNOSIS — M461 Sacroiliitis, not elsewhere classified: Secondary | ICD-10-CM | POA: Diagnosis not present

## 2019-07-09 ENCOUNTER — Ambulatory Visit (HOSPITAL_BASED_OUTPATIENT_CLINIC_OR_DEPARTMENT_OTHER): Payer: Medicare Other | Admitting: Ophthalmology

## 2019-07-15 DIAGNOSIS — M5416 Radiculopathy, lumbar region: Secondary | ICD-10-CM | POA: Diagnosis not present

## 2019-07-22 ENCOUNTER — Ambulatory Visit: Payer: Medicare Other | Attending: Family Medicine | Admitting: Family Medicine

## 2019-07-22 ENCOUNTER — Encounter (HOSPITAL_BASED_OUTPATIENT_CLINIC_OR_DEPARTMENT_OTHER): Payer: Self-pay | Admitting: Family Medicine

## 2019-07-22 DIAGNOSIS — E1129 Type 2 diabetes mellitus with other diabetic kidney complication: Secondary | ICD-10-CM | POA: Diagnosis present

## 2019-07-22 DIAGNOSIS — R809 Proteinuria, unspecified: Secondary | ICD-10-CM

## 2019-07-22 DIAGNOSIS — H9202 Otalgia, left ear: Secondary | ICD-10-CM

## 2019-07-22 DIAGNOSIS — J0110 Acute frontal sinusitis, unspecified: Secondary | ICD-10-CM | POA: Diagnosis not present

## 2019-07-22 MED ORDER — ALBUTEROL SULFATE (2.5 MG/3ML) 0.083% IN NEBU
2.5000 mg | INHALATION_SOLUTION | Freq: Four times a day (QID) | RESPIRATORY_TRACT | 1 refills | Status: DC | PRN
Start: 2019-07-22 — End: 2020-10-31

## 2019-07-22 MED ORDER — DM-GUAIFENESIN ER 30-600 MG PO TB12
1.00 | ORAL_TABLET | Freq: Two times a day (BID) | ORAL | 0 refills | Status: AC
Start: 2019-07-22 — End: 2019-07-29

## 2019-07-22 MED ORDER — FLUTICASONE PROPIONATE 50 MCG/ACT NA SUSP
1.0000 | Freq: Every day | NASAL | 2 refills | Status: DC
Start: 2019-07-22 — End: 2020-06-20

## 2019-07-22 MED ORDER — AMOXICILLIN 500 MG PO CAPS
500.0000 mg | ORAL_CAPSULE | Freq: Three times a day (TID) | ORAL | 0 refills | Status: AC
Start: 2019-07-22 — End: 2019-08-01

## 2019-07-22 NOTE — Progress Notes (Signed)
[phone]    Subjective     Samantha Cameron is a 63 year old female presents for left ear pain. She had a sinus infection about a month ago and some left ear pain and then two days ago she started having left ear pain again. No fevers. + runny nose and sinus congestion. She has had the sinus congestion for about a week and a half. She is using flonase and it isn't helping.     No sore throat. No cough or difficulty or trouble breathing. No nausea/vomiting. No body aches. No red eyes, changes in sense of smell or taste. She had some diarrhea last week but then felt better. SHe has been isolating and hasn't been near anybody.     #dmii  She only takes metformin for DMII. Sugars have been a little low, 84, 86. Today it was 94.     #morbid obesity:   She is trying to get some exercise, walk and eat salads. But it is hard with her back pain.     ROS: No fevers or weight loss. No headaches, double or blurry vision. No cough or shortness of breath. No chest pain or palpitations. No nausea, vomiting or abdominal pain. No dysuria, hematuria. No swelling in extremities. No rashes.    Social History     Socioeconomic History    Marital status: Divorced     Spouse name: Not on file    Number of children: Not on file    Years of education: Not on file    Highest education level: Not on file   Occupational History    Not on file   Social Needs    Financial resource strain: Not on file    Food insecurity:     Worry: Not on file     Inability: Not on file    Transportation needs:     Medical: Not on file     Non-medical: Not on file   Tobacco Use    Smoking status: Former Smoker     Packs/day: 0.50     Years: 31.00     Pack years: 15.50     Types: Cigarettes     Last attempt to quit: 03/10/2018     Years since quitting: 1.3    Smokeless tobacco: Never Used    Tobacco comment: quit smoking inform provided to patient   Substance and Sexual Activity    Alcohol use: No    Drug use: No    Sexual activity: Not Currently      Partners: Male     Birth control/protection: Tubal Ligation   Lifestyle    Physical activity:     Days per week: Not on file     Minutes per session: Not on file    Stress: Not on file   Relationships    Social connections:     Talks on phone: Not on file     Gets together: Not on file     Attends religious service: Not on file     Active member of club or organization: Not on file     Attends meetings of clubs or organizations: Not on file     Relationship status: Not on file    Intimate partner violence:     Fear of current or ex partner: Not on file     Emotionally abused: Not on file     Physically abused: Not on file     Forced sexual activity: Not  on file   Other Topics Concern    Not on file   Social History Narrative    Lives with son    1 daughter and another son passed away    Disabled from back    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) ([place])     Tobacco use disorder     Spinal stenosis of lumbar region     HLD (hyperlipidemia)     Chronic bilateral low back pain with bilateral sciatica     Skin change     Pineal gland cyst     History of vitamin D deficiency     Internal hemorrhoids     Venous (peripheral) insufficiency     Adrenal adenoma     Type 2 diabetes mellitus with microalbuminuria, without long-term current use of insulin ([facility])     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 ([facility])     Acute pain of right wrist     Abnormal PFTs     Screening for malignant neoplasm of breast     Skin irritation     H/O bone density study     Vertigo     Orthostatic hypotension     Social problem     Right arm weakness     Insomnia     Acute pain of  left knee     Hx of total knee replacement, left     Acute bacterial sinusitis     Seasonal allergic rhinitis     Left foot pain     Stress due to family tension     Leucocytosis     Viral URI     Chronic gastritis     Common bile duct dilation     Fatty liver     Pancreatic lesion     Vascular calcification     Umbilical hernia without obstruction and without gangrene     Arthritis of spine     Scoliosis of thoracic spine     Anxiety     Sleep disturbance     Reflux esophagitis     First degree AV block     Obstructive sleep apnea     Unintentional weight loss     Abdominal pain     Fibroid     Endometrial thickening on ultrasound     ASCUS of cervix with negative high risk HPV     Endometrial polyp     Panic disorder     Chest congestion     Adenomatous polyp of colon    Past Surgical History:  No date: ANES NERVE MUSC TENDON FASCIA&BURSA KNEE&/POPLT  No date: FOOT SURGERY      Comment:  bilateral  No date: LAPAROSCOPY SURG CHOLECYSTECTOMY  No date: OB ANTEPARTUM CARE CESAREAN DLVR & POSTPARTUM      Comment:  x3  No date: TONSILLECTOMY & ADENOIDECTOMY <AGE 21  04/13/2009: TOTAL KNEE REPLACEMENT      Comment:  left  08/11/2009: WRIST GANGLION  EXCISION      Comment:  left   Review of patient's family history indicates:  Problem: Diabetes      Relation: Mother          Age of Onset: (Not Specified)  Problem: Heart      Relation: Brother          Age of Onset: (Not Specified)          Comment: CAD and AAA  Problem: Hypertension      Relation: Mother          Age of Onset: (Not Specified)  Problem: Arthritis      Relation: Mother          Age of Onset: (Not Specified)  Problem: Stroke      Relation: Mother          Age of Onset: (Not Specified)  Problem: Diabetes      Relation: Brother          Age of Onset: (Not Specified)          Comment: same brother  Problem: Cancer - Other      Relation: Father          Age of Onset: (Not Specified)          Comment: type uncertain  Problem: Thyroid      Relation: Sister           Age of Onset: (Not Specified)  Problem: Alcohol/Drug Abuse      Relation: Son          Age of Onset: (Not Specified)  Problem: OTHER      Relation: Son          Age of Onset: (Not Specified)          Comment: ?brain aneurysm per steward records  Problem: No Known Problems      Relation: Grandchild          Age of Onset: (Not Specified)          Comment: 6  Problem: Cancer - Breast      Relation: FamHxNeg          Age of Onset: (Not Specified)  Problem: Cancer - Cervical      Relation: FamHxNeg          Age of Onset: (Not Specified)  Problem: Cancer - Ovarian      Relation: FamHxNeg          Age of Onset: (Not Specified)  Problem: Cancer - Colon      Relation: FamHxNeg          Age of Onset: (Not Specified)  Problem: Psychiatric Illness      Relation: FamHxNeg          Age of Onset: (Not Specified)      Current Outpatient Medications:  albuterol (PROVENTIL) (2.5 MG/3ML) 0.083% nebulizer solution Take 3 mLs by nebulization every 6 (six) hours as needed for Wheezing Dx: Code: J44.9 Disp: 75 mL Rfl: 1   FLUoxetine (PROZAC) 20 MG capsule Take 1 capsule by mouth daily Disp: 30 capsule Rfl: 2   metoprolol (TOPROL-XL) 25 MG 24 hr tablet Take 1 tablet by mouth daily Disp: 90 tablet Rfl: 3   lisinopril (ZESTRIL) 20 MG tablet Take 1 tablet by mouth daily Disp: 90 tablet Rfl: 3   metFORMIN (GLUCOPHAGE-XR) 500 MG 24 hr tablet take 4 tablets by mouth once daily with BREAKFAST Disp: 360 tablet Rfl: 3   Lancets Use 1  time daily.  Dispense lancets that are covered by insurance. Dx e11.29 Disp: 100 each Rfl: 11   famotidine (PEPCID) 20 MG tablet take 1 tablet by mouth twice a day Disp: 60 tablet Rfl: 11   atorvastatin (LIPITOR) 20 MG tablet take 1 tablet by mouth once daily Disp: 90 tablet Rfl: 3   glucose blood (ONETOUCH VERIO) test strip TEST once daily Disp: 100 strip Rfl: 11   glucose blood test strip by Finger Test or Other route daily Dispense test strips covered by insurance. Disp: 100 strip Rfl: 11   Blood Glucose Monitoring  Suppl (GLUCOSE MONITORING KIT) monitoring kit Dispense glucometer that is covered by insurance. Dx Code: E11.29 Disp: 1 each Rfl: 0   albuterol (PROVENTIL HFA,VENTOLIN HFA, PROAIR HFA) 108 (90 Base) MCG/ACT inhaler Inhale 2 puffs into the lungs every 6 (six) hours as needed for Wheezing or Shortness of breath Disp: 1 Inhaler Rfl: 11   Omega-3 Fatty Acids (FISH OIL) 1000 MG CAPS Take by mouth Disp:  Rfl:    cholecalciferol (VITAMIN D3) 1000 UNIT tablet Take 1 tablet by mouth daily Disp:  Rfl:    pregabalin (LYRICA) 100 MG capsule Take 100 mg by mouth as needed With Dr Dwyane Dee  Disp:  Rfl:    oxycodone-acetaminophen (PERCOCET) 5-325 MG per tablet Take 1 tablet by mouth 2 (two) times daily as needed With Dr Dwyane Dee Disp:  Rfl:    aspirin 81 MG tablet Take 81 mg by mouth daily. Disp:  Rfl:      No current facility-administered medications for this visit.   Review of Patient's Allergies indicates:   Codeine camsylate       Shortness of Breath, Other (See                            Comments)    Comment:Chest pain   Motrin [ibuprofen]      Nausea Only   Sulfa antibiotics       Other (See Comments)   Bactrim                 Rash, Itching           Objective     LMP 10/22/2007 (LMP Unknown)       Physical Exam:  Gen: No acute distress  Psych: cooperative pleasant  Pulm: no respiratory distress        Assessment   Assessment and Plan:    1. Acute non-recurrent frontal sinusitis  2. Left ear pain  62yo F with dm 2 with sinus congestion x 1 week, not improving with flonase and now with left ear pain. Symptoms could be vaguely suggest of COVID but less likely. Advised to isolate from others and pt may have COVID test if she desires but she is not interested. ADvised to avoid others, don't go into public until 14 days from start of symptoms and must be symptoms free.   --warm salt water gargle, mucinex, benadryl  --amox for sinustis, possible OM  --f/u if worsneing/ not improving    3. Type 2 diabetes mellitus with microalbuminuria,  without long-term current use of insulin ([facility])  Per reported sugars it appears to be controlled, recheck a1c when completely symptom free. ophthalmology already schedule   - POC HEMOGLObiN A1C; Future    4. 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 ([place])  The patient is asked to make an attempt to improve diet and exercise patterns to aid  in medical management of this problem.                   [name], MD

## 2019-07-23 ENCOUNTER — Telehealth (HOSPITAL_BASED_OUTPATIENT_CLINIC_OR_DEPARTMENT_OTHER): Payer: Self-pay

## 2019-07-23 NOTE — Progress Notes (Signed)
Per request I called the patient and left a voice message.  Please schedule televisit f/u with pcp in about 1 month for DM

## 2019-07-23 NOTE — Telephone Encounter (Signed)
-----   Message from [name], MD sent at 07/22/2019  6:16 PM EDT -----  Regarding: f/u DMII televisit with simons in one month   Thanks!  Samantha Cameron

## 2019-08-04 ENCOUNTER — Encounter (HOSPITAL_BASED_OUTPATIENT_CLINIC_OR_DEPARTMENT_OTHER): Payer: Self-pay | Admitting: Internal Medicine

## 2019-08-04 DIAGNOSIS — R809 Proteinuria, unspecified: Secondary | ICD-10-CM | POA: Insufficient documentation

## 2019-08-05 DIAGNOSIS — Z23 Encounter for immunization: Secondary | ICD-10-CM | POA: Diagnosis not present

## 2019-08-05 DIAGNOSIS — M47817 Spondylosis without myelopathy or radiculopathy, lumbosacral region: Secondary | ICD-10-CM | POA: Diagnosis not present

## 2019-08-05 DIAGNOSIS — Z79891 Long term (current) use of opiate analgesic: Secondary | ICD-10-CM | POA: Diagnosis not present

## 2019-08-05 DIAGNOSIS — M5417 Radiculopathy, lumbosacral region: Secondary | ICD-10-CM | POA: Diagnosis not present

## 2019-08-05 DIAGNOSIS — Z96659 Presence of unspecified artificial knee joint: Secondary | ICD-10-CM | POA: Diagnosis not present

## 2019-08-06 ENCOUNTER — Telehealth (HOSPITAL_BASED_OUTPATIENT_CLINIC_OR_DEPARTMENT_OTHER): Payer: Self-pay | Admitting: Ambulatory Care

## 2019-08-06 ENCOUNTER — Ambulatory Visit (HOSPITAL_BASED_OUTPATIENT_CLINIC_OR_DEPARTMENT_OTHER): Payer: Self-pay | Admitting: Registered Nurse

## 2019-08-06 ENCOUNTER — Other Ambulatory Visit: Payer: Self-pay

## 2019-08-06 NOTE — Telephone Encounter (Signed)
Regarding: dizziness  ----- Message from Quintin Alto sent at 08/06/2019  3:14 PM EDT -----  Samantha Cameron [id number], 63 year old, female    Calls today:  Sick    What are the symptoms dizziness, head congestion, sneezing - pt states that she was prescribed medication and her ears approved but nothing else has. She would like to speak with RN to be advised.  Person calling on behalf of patient: Patient (self)    Patient's language of care: English    Patient does not need an interpreter.    Patient's PCP: Driscilla Grammes, MD

## 2019-08-06 NOTE — Progress Notes (Signed)
Received notification that pt was given the flu shot in left deltoid at [facility] on 9/16   Immunization record updated     Arnette Felts, RN, 08/06/2019

## 2019-08-06 NOTE — Telephone Encounter (Signed)
Pt calling reporting ears and sinu feel a  little better but that pt is dizzy often  Feels slightly better when lays down- as soon as she gets up is very dizzy. Advised rest, increase fluids, small bland meals.  Pt denies temp- ear drainage-   Has mild  Headache.   Advised call in am and we can try and book 1st available televisit.

## 2019-08-07 ENCOUNTER — Emergency Department
Admission: EM | Admit: 2019-08-07 | Discharge: 2019-08-07 | Disposition: A | Discharge status: Home / Self Care | Payer: Medicare Other | Source: Intra-hospital | Attending: Emergency Medicine | Admitting: Emergency Medicine

## 2019-08-07 ENCOUNTER — Ambulatory Visit (HOSPITAL_BASED_OUTPATIENT_CLINIC_OR_DEPARTMENT_OTHER): Payer: Medicare Other | Admitting: Internal Medicine

## 2019-08-07 ENCOUNTER — Other Ambulatory Visit: Payer: Self-pay

## 2019-08-07 ENCOUNTER — Telehealth (HOSPITAL_BASED_OUTPATIENT_CLINIC_OR_DEPARTMENT_OTHER): Payer: Self-pay

## 2019-08-07 ENCOUNTER — Ambulatory Visit (HOSPITAL_BASED_OUTPATIENT_CLINIC_OR_DEPARTMENT_OTHER): Payer: Self-pay | Admitting: Ambulatory Care

## 2019-08-07 ENCOUNTER — Encounter (HOSPITAL_BASED_OUTPATIENT_CLINIC_OR_DEPARTMENT_OTHER): Payer: Self-pay

## 2019-08-07 DIAGNOSIS — R42 Dizziness and giddiness: Secondary | ICD-10-CM | POA: Diagnosis not present

## 2019-08-07 DIAGNOSIS — H6123 Impacted cerumen, bilateral: Secondary | ICD-10-CM | POA: Diagnosis not present

## 2019-08-07 DIAGNOSIS — R0981 Nasal congestion: Secondary | ICD-10-CM | POA: Diagnosis present

## 2019-08-07 DIAGNOSIS — H60332 Swimmer's ear, left ear: Secondary | ICD-10-CM | POA: Diagnosis not present

## 2019-08-07 DIAGNOSIS — Z87891 Personal history of nicotine dependence: Secondary | ICD-10-CM

## 2019-08-07 DIAGNOSIS — Z20828 Contact with and (suspected) exposure to other viral communicable diseases: Secondary | ICD-10-CM | POA: Diagnosis not present

## 2019-08-07 DIAGNOSIS — Z7189 Other specified counseling: Secondary | ICD-10-CM | POA: Diagnosis not present

## 2019-08-07 DIAGNOSIS — H9202 Otalgia, left ear: Secondary | ICD-10-CM | POA: Diagnosis not present

## 2019-08-07 LAB — COVID-19 OUTPATIENT: COVID-19 OUTPATIENT: NEGATIVE

## 2019-08-07 MED ORDER — METOCLOPRAMIDE HCL 5 MG/ML IJ SOLN
5.00 mg | Freq: Once | INTRAMUSCULAR | Status: AC
Start: 2019-08-07 — End: 2019-08-07
  Administered 2019-08-07: 5 mg via INTRAVENOUS
  Filled 2019-08-07: qty 2

## 2019-08-07 MED ORDER — ACETAMINOPHEN 500 MG PO TABS
1000.0000 mg | ORAL_TABLET | Freq: Once | ORAL | Status: AC
Start: 2019-08-07 — End: 2019-08-07
  Administered 2019-08-07: 1000 mg via ORAL
  Filled 2019-08-07: qty 2

## 2019-08-07 MED ORDER — DOCUSATE SODIUM 50 MG/5ML PO LIQD
100.00 mg | Freq: Once | ORAL | Status: AC
Start: 2019-08-07 — End: 2019-08-07
  Administered 2019-08-07: 100 mg via ORAL
  Filled 2019-08-07: qty 10

## 2019-08-07 MED ORDER — DIPHENHYDRAMINE HCL 50 MG/ML IJ SOLN
25.00 mg | Freq: Once | INTRAMUSCULAR | Status: AC
Start: 2019-08-07 — End: 2019-08-07
  Administered 2019-08-07: 25 mg via INTRAVENOUS
  Filled 2019-08-07: qty 1

## 2019-08-07 MED ORDER — CIPROFLOXACIN-DEXAMETHASONE 0.3-0.1 % OT SUSP
4.00 [drp] | Freq: Two times a day (BID) | OTIC | 0 refills | Status: AC
Start: 2019-08-07 — End: 2019-08-12

## 2019-08-07 MED ORDER — OXYMETAZOLINE HCL 0.05 % NA SOLN
2.00 | Freq: Once | NASAL | Status: AC
Start: 2019-08-07 — End: 2019-08-07
  Administered 2019-08-07: 2 via NASAL
  Filled 2019-08-07: qty 15

## 2019-08-07 MED ORDER — SODIUM CHLORIDE 0.9 % IV BOLUS
1000.0000 mL | Freq: Once | INTRAVENOUS | Status: AC
Start: 2019-08-07 — End: 2019-08-07
  Administered 2019-08-07: 1000 mL via INTRAVENOUS

## 2019-08-07 NOTE — Telephone Encounter (Signed)
I spoke to pt  Pt reports she booked televisit   I looked and saw pos covid screening  Pt reports she had diarrhea x 3 days last week- took pepto- feeling better  I offered to book in person acc visit  Pt refused- reports she cannot there and wants to keep televist

## 2019-08-07 NOTE — Narrator Note (Signed)
Bilateral ears flushed with sterile water with scant wax return, pt reports pain has improved somewhat but still discomfort to left ear, colace and cotton ball applied to left ear.

## 2019-08-07 NOTE — Discharge Instructions (Signed)
At this time an ear wick was placed in your left ear and we are putting you on antibiotic drops for the next 5 days.  A message was sent to our ENT clinic, they should follow-up with you within the next week for repeat check to ensure that your ears improving.  If you find that you are having worsening pain, swelling of your ear, persistent dizziness, worst headache of your life, persistent nausea or vomiting please return to the emergency department immediately.  Please make sure to get plenty of rest and fluids at this time as you heal.

## 2019-08-07 NOTE — Telephone Encounter (Signed)
-----   Message from [facility] sent at 08/07/2019  2:01 PM EDT -----  Regarding: Requesting Phone Call - APPT  Contact: [phone]  Samantha Cameron [id number], 63 year old, female    Calls today:  Clinical Questions ([place])    Name of person calling Patient  Specific nature of request Requesting phone call - pt states that she was supposed to receive a call regarding scheduling an appt at [facility]. Checked televisit encounter, pt is to be scheduled with [facility].  Return phone number [phone]    Patient's language of care: English    Patient does not need an interpreter.    Patient's PCP: Driscilla Grammes, MD

## 2019-08-07 NOTE — Telephone Encounter (Signed)
Regarding: DIZZINESS  ----- Message from Zenaida Deed sent at 08/07/2019 10:42 AM EDT -----  Samantha Cameron [id number], 63 year old, female    Calls today:  Sick    What are the symptoms DIZZINESS and congestion.  How long has patient been sick? NA.  What has pt. tried at home Prescribed medication.    Patient's language of care: English    Patient does not need an interpreter.    Patient's PCP: Driscilla Grammes, MD

## 2019-08-07 NOTE — ED Provider Notes (Signed)
I performed a history and physical examination of Samantha Cameron and discussed management with the PA. I reviewed the PA's note and agree with the documented finding and plan of care.  Exceptions, additions, and details noted below.    HPI:  Briefly, this is a 63 year old female who presents for evaluation of left ear pain and dizziness. She has had these symptoms for about a week. She was prescribed amoxicillin via televist, which she took, but continues to have symptoms. No fevers. She also feels congested. The dizziness seems triggered by head movements or changing position.    EXAM:  GENERAL: No acute distress, non-toxic   HEAD: No evidence of trauma.   Pupils equal & reactive, extraocular movements intact.   No direction-changing nystagmus. No vertical skew.  Impacted cerumen bilaterally.  NECK: Supple. No stridor.  LUNGS: Respirations even and unlabored. No accessory muscle use.  Clear to auscultation bilaterally. No wheezes, rales, rhonchi.    HEART: Regular rate & rhythm.  No murmurs appreciated.    SKIN: Warm and dry, no rash  NEUROLOGIC: Alert and oriented x3; speaking in clear fluent sentences; moves all extremities well.   PSYCHIATRIC: Appropriate, cooperative.     MDM/Plan:  The patient presents with ear pain and intermittent dizziness. No indication for head imaging at this time as I do not think her symptoms represent an acute vestibular syndrome. She will be treated for otitis externa.    Diagnosis/Diagnoses:  (H92.02) Otalgia of left ear  Plan: Refer to ENT    [phone]) Acute swimmer's ear of left side  Plan: Refer to ENT    (R42) Dizziness    (Z71.89) Advice given about COVID-19 virus infection     Disposition:  Discharged home.    Condition at disposition:  Improved.      Philip Aspen, MD

## 2019-08-07 NOTE — ED Provider Notes (Signed)
ED nursing record was reviewed. Prior records as available electronically through the Epic record were reviewed.    Patient was seen along with Dr. Rulon Abide and management was discussed with them.    HPI:    This 63 year old female patient presents to the Emergency Department with chief complaint of left ear pain, dizziness, sinus pressure.  Patient reports that for a month now she has been experiencing sinus pressure in her face and is tried Flonase, Mucinex D, Claritin-D and has also been placed on 10 days of amoxicillin about 3 weeks ago.  Patient reports that the dizziness feels more like a head fullness and not a spinning around her or around the room.  Patient reports when she looks up or down she finds that the pressure in her face is worse and can make her feel like she almost wants to fall.  Patient denies any associated neck pain or stiffness, chest pain, shortness of breath, weakness in her upper or lower extremities, fevers or chills.  Patient reports that she did notice some relief with the amoxicillin however the pain in her ear never dissipated.  Patient reports that she also has a fullness in her ear.  Patient does have a history of depression, osteoarthritis.  Patient also has a history of allergies to codeine and Motrin.    ROS: Pertinent positives were reviewed as per the HPI above. All other systems were reviewed and are negative.      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  1. GERD - severe 2.                Unintentional weight loss 3. Abdominal pain   Plan: 1.                Recommend EGD. This procedure has been fully reviewed  with the patient and written informed consent has been                obtained. 2.   No date: Esophageal reflux  No date: External hemorrhoid      Comment:  per steward records  04/19/2017: Gastritis without bleeding      Comment:  GI endoscopy: 06/2018: Mildly severe reflux esophagitis.               Biopsied.      - Chronic gastritis. Biopsied.      - No                gross lesions in the entire examined duodenum.                Complications:  No immediate complications.                Recommendation:      - Await pathology results. 7/2019GI:               Assessment: Pt is 2F with PMH COPD, morbid obesity,                H.pylori (2009, unclear if treated) who p/w severe GERD                symptoms, unintentional weigh  05/10/2018: Gastroesophageal reflux disease without esophagitis  No date: HTN (hypertension)  02/01/2012: Hypoxia  02/16/2017: Left ear pain  06/07/2015: Left genital labial abscess  06/16/2015: Obesity, Class III, 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y) ([place])  08/16/2015: Osteopenia      Comment:  On xray of left knee per steward records 06/2014 Unclear                if addressed  05/23/2018: Pancreatic lesion  09/14/2015: Right ear impacted cerumen  No date: Sleep  apnea  08/15/2015: Type 2 diabetes mellitus without complication, without   long-term current use of insulin (HCC)      Comment:   HEMOGLOBIN A1C (%) Date Value 07/15/2015 6.2 (H)                ----------  Steward record - a1c 6.2 on 01/31/15 6.4 on                09/30/14 5.9 on 12/29/13 6.4 08/18/13   11/18/2017: Viral URI with cough  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) ([place])     Tobacco use disorder     Spinal stenosis of lumbar region     HLD (hyperlipidemia)     Chronic bilateral low back pain with bilateral sciatica     Skin change     Pineal gland cyst     History of vitamin D deficiency     Internal hemorrhoids     Venous (peripheral) insufficiency     Adrenal adenoma     Type 2 diabetes mellitus with microalbuminuria, without long-term current use of insulin (HCC)     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 (HCC)     Acute pain of right wrist     Abnormal PFTs     Screening for malignant neoplasm of breast     Skin irritation     H/O bone density study     Vertigo     Orthostatic hypotension     Social problem     Right arm weakness     Insomnia  Acute pain of left knee     Hx of total knee replacement, left     Acute bacterial sinusitis     Seasonal allergic rhinitis     Left foot pain     Stress due to family tension     Leucocytosis     Viral URI     Chronic gastritis     Common bile duct dilation     Fatty liver     Pancreatic lesion     Vascular calcification     Umbilical hernia without obstruction and without gangrene     Arthritis of spine     Scoliosis of thoracic spine     Anxiety     Sleep disturbance     Reflux esophagitis     First degree AV block     Obstructive sleep apnea     Unintentional weight loss     Abdominal pain     Fibroid     Endometrial thickening on ultrasound     ASCUS of cervix with negative high risk HPV     Endometrial polyp     Panic disorder     Chest congestion     Adenomatous polyp of colon      Microalbuminuria        Past Surgical History: Past Surgical History:  No date: ANES NERVE MUSC TENDON FASCIA&BURSA KNEE&/POPLT  No date: FOOT SURGERY      Comment:  bilateral  No date: LAPAROSCOPY SURG CHOLECYSTECTOMY  No date: OB ANTEPARTUM CARE CESAREAN DLVR & POSTPARTUM      Comment:  x3  No date: TONSILLECTOMY & ADENOIDECTOMY <AGE 65  04/13/2009: TOTAL KNEE REPLACEMENT      Comment:  left  08/11/2009: WRIST GANGLION EXCISION      Comment:  left       Medications:   No current facility-administered medications on file prior to encounter.   Current Outpatient Medications on File Prior to Encounter:  fluticasone (FLONASE) 50 MCG/ACT nasal spray 1 spray by Each Nostril route daily Disp: 1 Bottle Rfl: 2   albuterol (PROVENTIL) (2.5 MG/3ML) 0.083% nebulizer solution Take 3 mLs by nebulization every 6 (six) hours as needed for Wheezing Dx: Code: J44.9 Disp: 75 mL Rfl: 1   FLUoxetine (PROZAC) 20 MG capsule Take 1 capsule by mouth daily Disp: 30 capsule Rfl: 2   metoprolol (TOPROL-XL) 25 MG 24 hr tablet Take 1 tablet by mouth daily Disp: 90 tablet Rfl: 3   lisinopril (ZESTRIL) 20 MG tablet Take 1 tablet by mouth daily Disp: 90 tablet Rfl: 3   metFORMIN (GLUCOPHAGE-XR) 500 MG 24 hr tablet take 4 tablets by mouth once daily with BREAKFAST Disp: 360 tablet Rfl: 3   Lancets Use 1 time daily.  Dispense lancets that are covered by insurance. Dx e11.29 Disp: 100 each Rfl: 11   famotidine (PEPCID) 20 MG tablet take 1 tablet by mouth twice a day Disp: 60 tablet Rfl: 11   atorvastatin (LIPITOR) 20 MG tablet take 1 tablet by mouth once daily Disp: 90 tablet Rfl: 3   glucose blood (ONETOUCH VERIO) test strip TEST once daily Disp: 100 strip Rfl: 11   glucose blood test strip by Finger Test or Other route daily Dispense test strips covered by insurance. Disp: 100 strip Rfl: 11   Blood Glucose Monitoring Suppl (GLUCOSE MONITORING KIT) monitoring kit Dispense glucometer that is covered by insurance. Dx Code: E11.29 Disp: 1 each Rfl: 0    albuterol (PROVENTIL HFA,VENTOLIN HFA, PROAIR  HFA) 108 (90 Base) MCG/ACT inhaler Inhale 2 puffs into the lungs every 6 (six) hours as needed for Wheezing or Shortness of breath Disp: 1 Inhaler Rfl: 11   Omega-3 Fatty Acids (FISH OIL) 1000 MG CAPS Take by mouth Disp:  Rfl:    cholecalciferol (VITAMIN D3) 1000 UNIT tablet Take 1 tablet by mouth daily Disp:  Rfl:    pregabalin (LYRICA) 100 MG capsule Take 100 mg by mouth as needed With Dr Dwyane Dee  Disp:  Rfl:    oxycodone-acetaminophen (PERCOCET) 5-325 MG per tablet Take 1 tablet by mouth 2 (two) times daily as needed With Dr Dwyane Dee Disp:  Rfl:    aspirin 81 MG tablet Take 81 mg by mouth daily. Disp:  Rfl:          Social History:   Social History     Socioeconomic History    Marital status: Divorced     Spouse name: Not on file    Number of children: Not on file    Years of education: Not on file    Highest education level: Not on file   Occupational History    Not on file   Social Needs    Financial resource strain: Not on file    Food insecurity:     Worry: Not on file     Inability: Not on file    Transportation needs:     Medical: Not on file     Non-medical: Not on file   Tobacco Use    Smoking status: Former Smoker     Packs/day: 0.50     Years: 31.00     Pack years: 15.50     Types: Cigarettes     Last attempt to quit: 03/10/2018     Years since quitting: 1.4    Smokeless tobacco: Never Used    Tobacco comment: quit smoking inform provided to patient   Substance and Sexual Activity    Alcohol use: No    Drug use: No    Sexual activity: Not Currently     Partners: Male     Birth control/protection: Tubal Ligation   Lifestyle    Physical activity:     Days per week: Not on file     Minutes per session: Not on file    Stress: Not on file   Relationships    Social connections:     Talks on phone: Not on file     Gets together: Not on file     Attends religious service: Not on file     Active member of club or organization: Not on file     Attends  meetings of clubs or organizations: Not on file     Relationship status: Not on file    Intimate partner violence:     Fear of current or ex partner: Not on file     Emotionally abused: Not on file     Physically abused: Not on file     Forced sexual activity: Not on file   Other Topics Concern    Not on file   Social History Narrative    Lives with son    1 daughter and another son passed away    Disabled from back    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 Simons,MD, 07/15/2015, 2:56 PM                Allergies:  Review of Patient's Allergies indicates:   Codeine camsylate       Shortness of Breath, Other (See                            Comments)    Comment:Chest pain   Motrin [ibuprofen]      Nausea Only   Sulfa antibiotics       Other (See Comments)   Bactrim                 Rash, Itching      Physical Exam:   08/07/19  1600   BP: 117/79   Pulse: 84   Resp: 18   Temp: 97.9 F   SpO2: 97%   Weight: 97.1 kg (214 lb)       GENERAL: Well appearing, No acute distress, non-toxic.   SKIN:  Warm & Dry, no erythema or rash.  HEAD:  NCAT. Sclerae are anicteric and aninjected, oropharynx is clear with moist mucous membranes.  Bilateral ear canals are cerumen impacted.  Colace was placed in both ears by the nurse, then irrigated, I was able to visualize the right TM without issue, however there was still some cerumen in the ear canal.  The left TM however had a great deal of debris and cerumen, I was unable to visualize the TM and due to patient's discomfort instead I placed an ear wick and started her on Ciprodex drops.  There is no erythema, edema along the mastoid bones, there is no proptosis of the pinna, on the left ear with palpation of the tragus and pinna patient does have discomfort.  LUNGS:  Clear to  auscultation bilaterally. No wheezes, rales, rhonchi.   HEART:  RRR.  No murmurs, rubs, or gallops.   ABDOMEN:  Soft, NTND.  No hepatosplenomegaly.  No masses or pulsations.     NEUROLOGIC:  Alert and oriented x4; moves all extremities well; speaking in clear fluent sentences. CNsII-XII symmetrical and intact. Sensation intact to light touch throughout. 5/5 strength globally.  Cerebellar testing within normal limits, no ataxia on finger-to-nose.  Rapid hand motion within normal limits.    RESULTS  Results for orders placed or performed during the hospital encounter of 08/07/19 (from the past 24 hour(s))   COVID-19 Outpatient    Collection Time: 08/07/19  7:24 PM    Narrative    Source Details->nasal        MEDICATIONS ADMINISTERED ON THIS VISIT  Orders Placed This Encounter      sodium chloride 0.9 % IV bolus 1,000 mL      metoclopramide (REGLAN) injection 5 mg      diphenhydrAMINE (BENADRYL) injection 25 mg      acetaminophen (TYLENOL) tablet 1,000 mg      docusate (COLACE) 50 MG/5ML liquid 100 mg      oxymetazoline (AFRIN) 0.05 % nasal spray 2 spray      ciprofloxacin-dexamethasone (CIPRODEX) otic suspension          Sig: Place 4 drops into the left ear 2 (two) times daily  for 5 days          Dispense:  7.5 mL  Refill:  0         ED Course and Medical Decision-making:  At this time do believe patient's head fullness is most likely due to sinus pressure.  She was instructed to continue taking Flonase and to continue with the Claritin at home.  I sent a message to her ear, nose, throat clinic as I do not believe she needs imaging or warrants further p.o. antibiotic therapy.  However I did place an ear wick in the left ear canal due to the debris that was noted and unable to visualize the TM and placed her on Ciprodex drops.  There is no sign of mastoiditis bilaterally, does not appear to be a malignant otitis externa.  Discussed with patient the importance of follow-up with our ear nose and throat clinic as  well as her PCP to which she was very understanding of and compliant with.  Vital signs stable here.  Did not believe CT head imaging was necessary given that patient has had several MRIs and CT head imaging in the past and has not had any abnormal findings as well as a normal physical exam and her symptoms have been present for over a month and it is very atypical for an acute intracranial abnormality.  Strict return precautions however were given to the patient which was understanding of and compliant with.    Patient was instructed to follow-up with regular doctor in 2-3 days for re-evaluation.  Signs and symptoms for which to return to the Emergency Department were discussed with the patient in detail, and they understand and are in agreement with her care plan at this time.    The patient remained hemodynamically stable throughout entire visit.    Condition:Stable  Disposition: Home      Diagnosis/Diagnoses:  Otalgia of left ear  Acute swimmer's ear of left side  Dizziness  Advice given about COVID-19 virus infection    Allayne Stack, PA-C  Emergency [place]    This Emergency Department patient encounter note was created using voice-recognition software and in real time during the ED visit. Please excuse any typographical errors that have not been edited out.

## 2019-08-07 NOTE — Progress Notes (Signed)
Patient is currently at [facility] ED .  RN spoke to patient in previous encounter.         Janann August, MD  [place]            Patient treated on 9/2 for sinusitis with flonase and amox without improvement. Continues with congestion in nose and head. Also with dizziness x few days. Recommend in person visit today at [facility]. If no appts, then recommend urgent care visit   Thanks    Previous Messages      ----- Message -----   From: Janann August, MD   Sent: 08/07/2019 12:41 PM EDT   To: Dalia Heading     RN   Patient treated on 9/2 for sinusitis with flonase and amox without improvement. Continues with congestion in nose and head. Also with dizziness x few days. Recommend in person visit today. If no appts, then recommend urgent care visit   Thanks   Janann August, MD, 08/07/2019

## 2019-08-07 NOTE — ED Notes (Signed)
Bed: 10-A  Expected date:   Expected time:   Means of arrival:   Comments:  covid

## 2019-08-07 NOTE — Telephone Encounter (Signed)
[facility] [place]  Boneta Lucks   Phone Number: [phone]            NAYELIS CAFARELLI ML:[id number], 63 year old, female     Calls today: pt is waiting to get a call from nurse, so she can go to hospital, statas that nurse said would call her back she haven't got any call yet and has been more than an hour. Please advise   Patient's language of care: English       Patient's PCP: Driscilla Grammes, MD      Returned call to pt   States she had a televisit  with Dr. Garth Bigness today   States Dr. Garth Bigness told her  someone would call her to schedule an appointment   She waited but no one called so she went to the emergency room     Pt currently at [facility] ED in the waiting room   Pt alert, somewhat anxious   Speech clear   Speaking in complete sentences   Emotional support given during these challenging times  Pt states she still feels dizzy   Feels she needs an antibiotic     Pt informed provider will be made aware and we will follow up with her   Pt appreciative of call    Arnette Felts, RN, 08/07/2019

## 2019-08-07 NOTE — ED Triage Note (Signed)
Pt presents with continued left ear ache/popping after amoxicillin, mucinex D.  Congested sinuses, dizzy spells x 1 week. Diarrhea x 1 week ago for 3 days.

## 2019-08-07 NOTE — Narrator Note (Signed)
Patient Disposition  Patient education for diagnosis, medications, activity, diet and follow-up.  Patient left ED 7:26 PM.  Patient rep received written instructions.    Interpreter to provide instructions: No    Patient belongings with patient: YES    Have all existing LDAs been addressed? Yes    Have all IV infusions been stopped? Yes    Destination: Discharged to home with instructions and prescriptions, pt verbalized understanding.

## 2019-08-07 NOTE — Progress Notes (Signed)
Patient presents with:  Other: Name and DOB verified  Other: Meds and Pharmacy veriifed/Patient was taking Amoxiclilin, muxines and flonase   Other: Ear Problem for the past 2 Weeks also still feeling dizzy/     Had televisit on 9/2 - given amox, flonase, and mucinex D  She is not feeling any better  She is now also started having dizzy spells  Feels congested in head - all over her head  No cough  + nasal congestion/runny nose  No fever  No vomiting  + diarrhea x 3 days then stopped, might have been during when taking antibiotics - she is not sure  She feels dizzy or like she is going to faint when she is walking   She was in market basket and she thought she was going to fall, but held onto cart and she was OK  Dizziness 3-4 times/day  Feels dizzy even when she sits down   Feels like a spinning feeling   Dizziness x 5 days  + ears are popping, left ear mainly  She goes to clean her ears and it hurts  Took amox - helped some but still feels soreness  No sick contacts  Using flonase  No sinus rinses  Got her flu shot 2-3 days ago  No SOB    PMH/MEDS/ALL REVIEWED    Patient speaking full sentences without apparent distress    PE deferred due to televisit given public health issues related to COVID-19    A/P  Sinus congestion, nasal congestion, dizziness - patient s/p 10 days amox + flonase and mucinex without improvement.  She now has dizziness for the past few days and not significantly improved otherwise.  At this point, given lack of response to treatment and new symptoms, needs in person evaluation for exam, vitals, further evaluation as appropriate.  Patient agrees with plan.  She will have someone take her to appt.  Advised she not drive if feeling dizzy.  Message sent to front desk staff to schedule at [facility] given symptoms.  Patient agrees with plan.  Recommend urgent care if not able to be seen today.  Questions answered.  Call if worsening/new symptoms in the meantime.          Telephone visit time:  12:35-12:44 (51min)

## 2019-08-10 ENCOUNTER — Encounter (HOSPITAL_BASED_OUTPATIENT_CLINIC_OR_DEPARTMENT_OTHER): Payer: Self-pay | Admitting: Internal Medicine

## 2019-08-13 ENCOUNTER — Ambulatory Visit: Payer: Medicare Other | Attending: Physician Assistant | Admitting: Otolaryngology

## 2019-08-13 ENCOUNTER — Other Ambulatory Visit: Payer: Self-pay

## 2019-08-13 DIAGNOSIS — H9202 Otalgia, left ear: Secondary | ICD-10-CM | POA: Diagnosis not present

## 2019-08-13 DIAGNOSIS — H6122 Impacted cerumen, left ear: Secondary | ICD-10-CM | POA: Insufficient documentation

## 2019-08-13 DIAGNOSIS — H903 Sensorineural hearing loss, bilateral: Secondary | ICD-10-CM

## 2019-08-13 DIAGNOSIS — M26622 Arthralgia of left temporomandibular joint: Secondary | ICD-10-CM | POA: Diagnosis not present

## 2019-08-13 NOTE — Progress Notes (Signed)
History:  Samantha Cameron is a 63 year old female who reports left otalgia for several weeks.    Impressions:  Tympanometry is consistent with normal TM mobility and normal middle ear pressure in both ears. 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 WRS Excellent in both ears. An asymmetry noted in the high frequencies, poorer in the right ear.    Recommendation:  Re-evaluation upon Physician's request

## 2019-08-13 NOTE — Progress Notes (Signed)
HPI: 63 year old female Samantha Cameron complaints of left otalgia 3 mo ago and recurrence 3 wks ago. She has tried Amox and then later CiproDex for 3 days with only slight sx improvement. Pt went to ED on 08/07/19. Prior to the otalgia, she had left jaw pain. Today there is No otorrhea, Yes, left otalgia, No vertigo.    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  1. GERD - severe 2.                Unintentional weight loss 3. Abdominal pain   Plan: 1.                Recommend EGD. This procedure has been fully reviewed                with the patient and written informed consent has been                obtained. 2.   No date: Esophageal reflux  No date: External hemorrhoid      Comment:  per steward records  04/19/2017: Gastritis without  bleeding      Comment:  GI endoscopy: 06/2018: Mildly severe reflux esophagitis.               Biopsied.      - Chronic gastritis. Biopsied.      - No                gross lesions in the entire examined duodenum.                Complications:  No immediate complications.                Recommendation:      -  Await pathology results. 7/2019GI:               Assessment: Pt is 58F with PMH COPD, morbid obesity,                H.pylori (2009, unclear if treated) who p/w severe GERD                symptoms, unintentional weigh  05/10/2018: Gastroesophageal reflux disease without esophagitis  No date: HTN (hypertension)  02/01/2012: Hypoxia  02/16/2017: Left ear pain  06/07/2015: Left genital labial abscess  06/16/2015: Obesity, Class III, 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y) ([place])  08/16/2015: Osteopenia      Comment:  On xray of left knee per steward records 06/2014 Unclear                if addressed  05/23/2018: Pancreatic lesion  09/14/2015: Right ear impacted cerumen  No date: Sleep apnea  08/15/2015: Type 2 diabetes mellitus without complication, without   long-term current use of insulin (HCC)      Comment:   HEMOGLOBIN A1C (%) Date Value 07/15/2015 6.2 (H)                ----------  Steward record - a1c 6.2 on 01/31/15 6.4 on                09/30/14 5.9 on 12/29/13 6.4 08/18/13   11/18/2017: Viral URI with cough    Review of Patient's Allergies indicates:   Codeine camsylate       Shortness of Breath, Other (See                            Comments)    Comment:Chest pain   Motrin [ibuprofen]      Nausea Only   Sulfa antibiotics       Other (See Comments)   Bactrim                 Rash, Itching    [EXPIRED] ciprofloxacin-dexamethasone (CIPRODEX) otic suspension, Place 4 drops into the left ear 2 (two) times daily  for 5 days, Disp: 7.5 mL, Rfl: 0  fluticasone (FLONASE) 50 MCG/ACT nasal spray, 1 spray by Each Nostril route daily, Disp: 1 Bottle, Rfl: 2  albuterol (PROVENTIL) (2.5 MG/3ML) 0.083% nebulizer solution, Take 3 mLs by  nebulization every 6 (six) hours as needed for Wheezing Dx: Code: J44.9, Disp: 75 mL, Rfl: 1  FLUoxetine (PROZAC) 20 MG capsule, Take 1 capsule by mouth daily, Disp: 30 capsule, Rfl: 2  metoprolol (TOPROL-XL) 25 MG 24 hr tablet, Take 1 tablet by mouth daily, Disp: 90 tablet, Rfl: 3  lisinopril (ZESTRIL) 20 MG tablet, Take 1 tablet by mouth daily, Disp: 90 tablet, Rfl: 3  metFORMIN (GLUCOPHAGE-XR) 500 MG 24 hr tablet, take 4 tablets by mouth once daily with BREAKFAST, Disp: 360 tablet, Rfl: 3  Lancets, Use 1 time daily.  Dispense lancets that are covered by insurance. Dx e11.29, Disp: 100 each, Rfl: 11  famotidine (PEPCID) 20 MG tablet, take 1 tablet by mouth twice a day, Disp: 60 tablet, Rfl: 11  atorvastatin (LIPITOR) 20 MG tablet, take 1 tablet by mouth once daily, Disp: 90 tablet, Rfl: 3  albuterol (PROVENTIL HFA,VENTOLIN HFA, PROAIR HFA) 108 (90 Base) MCG/ACT inhaler, Inhale 2 puffs into the lungs every 6 (six) hours as needed for Wheezing or Shortness of breath, Disp: 1 Inhaler, Rfl: 11  Omega-3 Fatty Acids (FISH OIL) 1000 MG CAPS, Take  by mouth, Disp: , Rfl:   cholecalciferol (VITAMIN D3) 1000 UNIT tablet, Take 1 tablet by mouth daily, Disp: , Rfl:   pregabalin (LYRICA) 100 MG capsule, Take 100 mg by mouth as needed With Dr Dwyane Dee     , Disp: , Rfl:   oxycodone-acetaminophen (PERCOCET) 5-325 MG per tablet, Take 1 tablet by mouth 2 (two) times daily as needed With Dr Dwyane Dee, Disp: , Rfl:   aspirin 81 MG tablet, Take 81 mg by mouth daily., Disp: , Rfl:     No current facility-administered medications on file prior to visit.       Social and family history reviewed either in EPIC or with patient and are not contributory unless specifically noted in HPI.    Review of Systems  Constitutional normal.  CV/Palpitations No.  Resp/SOB No.  Integ/Skin lesions No.  MSk/Pain No.  Psych normal.    Physical Exam  There were no vitals filed for this visit.    Constitutional: normal.  Facial lesions: No.  Ears: Right:  Obstructing cerumen:No. EAC dry and is open. TM normal.    Left:  Obstructing cerumen:Yes. EAC dry and is open. TM normal.   Facial Nerve: normal One out of 6 House-Brackmann scale bilaterally.  Left TMJ tenderness on palpation with mouth opening.   Sinus: Pain No.  Neck: Soft. Trachea midline. No palpable masses.  Thyroid: Normal to palpation.  Lymph:  No palpable LN in the neck.  Salivary glands:  Normal size, non-tender.  Communication: Voice normal.    Eyes: PERRLA and EOMI. Nystagmus No  Respiratory drive spontaneous and comfortable.  Cardiac rhythm regular.  Neuro: normal.  Mood/Psych: normal.    I personally reviewed the patient's audiogram that was ordered by me today and agree with audiologist's findings and recommendation.    Moderate high frequency SNHL in the right ear and mild high freq SNHL in the left ear. Discrim excellent and tymp nml B.     Procedure: left ear wax removal    Indication: Impacted ear wax    Under binocular otoscopy, ear wax was removed with otologic curette and suction.    There is no complication.     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 Discussed the pathophysiology of TMJ arthralgia and arthritis. Recommend soft diet (diet sheet was offered to the patient), TMJ massage, warm compress, avoiding chewing gum, and Aleve or Ibuprofen PRN pain. RTC in 6 wks if sx does not improve. For the SNHL, recommend repeat audio in a yr.     This record was generated using voice recognition software. I proof-read and corrected any voice recognitions errors found. Please excuse any remaining voice recognition errors which were not detected.

## 2019-08-17 DIAGNOSIS — M47816 Spondylosis without myelopathy or radiculopathy, lumbar region: Secondary | ICD-10-CM | POA: Diagnosis not present

## 2019-09-02 DIAGNOSIS — M47817 Spondylosis without myelopathy or radiculopathy, lumbosacral region: Secondary | ICD-10-CM | POA: Diagnosis not present

## 2019-09-02 DIAGNOSIS — M5417 Radiculopathy, lumbosacral region: Secondary | ICD-10-CM | POA: Diagnosis not present

## 2019-09-09 DIAGNOSIS — M47816 Spondylosis without myelopathy or radiculopathy, lumbar region: Secondary | ICD-10-CM | POA: Diagnosis not present

## 2019-09-09 DIAGNOSIS — F40231 Fear of injections and transfusions: Secondary | ICD-10-CM | POA: Diagnosis not present

## 2019-09-17 DIAGNOSIS — M5416 Radiculopathy, lumbar region: Secondary | ICD-10-CM | POA: Diagnosis not present

## 2019-09-25 ENCOUNTER — Ambulatory Visit: Payer: Medicare Other | Attending: Internal Medicine | Admitting: Internal Medicine

## 2019-09-25 ENCOUNTER — Other Ambulatory Visit: Payer: Self-pay

## 2019-09-25 ENCOUNTER — Telehealth (HOSPITAL_BASED_OUTPATIENT_CLINIC_OR_DEPARTMENT_OTHER): Payer: Self-pay | Admitting: Internal Medicine

## 2019-09-25 DIAGNOSIS — M545 Low back pain, unspecified: Secondary | ICD-10-CM

## 2019-09-25 DIAGNOSIS — Z6839 Body mass index (BMI) 39.0-39.9, adult: Secondary | ICD-10-CM | POA: Insufficient documentation

## 2019-09-25 DIAGNOSIS — E1129 Type 2 diabetes mellitus with other diabetic kidney complication: Secondary | ICD-10-CM | POA: Insufficient documentation

## 2019-09-25 DIAGNOSIS — H903 Sensorineural hearing loss, bilateral: Secondary | ICD-10-CM | POA: Insufficient documentation

## 2019-09-25 DIAGNOSIS — G8929 Other chronic pain: Secondary | ICD-10-CM

## 2019-09-25 DIAGNOSIS — R809 Proteinuria, unspecified: Secondary | ICD-10-CM | POA: Diagnosis present

## 2019-09-25 DIAGNOSIS — M26609 Unspecified temporomandibular joint disorder, unspecified side: Secondary | ICD-10-CM

## 2019-09-25 NOTE — Telephone Encounter (Signed)
-----  Message from [employer]. [name], MD sent at 09/25/2019  4:56 PM EST -----  Regarding: In Person Visit Request  No patient name on file.  No patient ID available    Please screen for COVID and schedule this patient for an in-person visit at [facility]    Reason for visit: labs  Schedule with: The following provider: lab  Time frame: Within 2 weeks  If requested appointment not available within the requested time frame: Route chart back to referring provider    ## Providers should route this request to their home clinic front desk ##

## 2019-09-25 NOTE — Progress Notes (Signed)
Reason for visit - requesting labs    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) ([place])     Tobacco use disorder     Spinal stenosis of lumbar region     HLD (hyperlipidemia)     Chronic bilateral low back pain with bilateral sciatica     Skin change     Pineal gland cyst     History of vitamin D deficiency     Internal hemorrhoids     Venous (peripheral) insufficiency     Adrenal adenoma     Type 2 diabetes mellitus with microalbuminuria, without long-term current use of insulin (HCC)     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 (HCC)     Acute pain of right wrist     Abnormal PFTs     Screening for malignant neoplasm of breast     Skin irritation     H/O bone density study     Vertigo     Orthostatic hypotension     Social problem     Right arm weakness     Insomnia     Acute pain of left knee     Hx of total knee replacement, left     Acute bacterial sinusitis     Seasonal allergic rhinitis     Left foot pain     Stress due to family tension     Leucocytosis     Viral URI     Chronic gastritis     Common bile duct dilation     Fatty liver     Pancreatic lesion     Vascular calcification     Umbilical hernia without obstruction and without gangrene     Arthritis of spine     Scoliosis of thoracic spine     Anxiety     Sleep disturbance     Reflux esophagitis     First degree AV block     Obstructive sleep apnea     Unintentional weight loss     Abdominal pain     Fibroid     Endometrial thickening on ultrasound     ASCUS of cervix with negative high risk HPV     Endometrial polyp     Panic disorder     Chest congestion     Adenomatous polyp of colon     Microalbuminuria      S:  Asking for blood tests for DM  Taking metformin 4 tabs daily  Had one sugar 87 - sweaty  HEMOGLOBIN A1C (%)   Date Value   11/05/2018 6.1 (H)   05/14/2018 6.3 (H)   10/28/2017 6.1 (H)     No results found for: POCA1C  Asking for thyroid - fam hx  hypothyroid    Taking lisinopril for DM and kidneys  taking metoprolol for HR  Taking cholesterol pill  Eye visit booked 11/19    Doing TMJ diet, rest, heat  Some SNHL bilaterally  Feeling a little better    Asking about covid results - negative from ED 07/2019    Had rhizotomy for chronic back pain last week on right  [place] with dr Dwyane Dee  Thinks helped right side   Left side worse  Using cane for balance issues related to pain  Taking lyrica and percocet still    Thinks 214  Most Recent Weight Reading(s)  08/07/19 : 97.1 kg (214 lb)  01/15/19 :  97.1 kg (214 lb)  11/05/18 : 97.5 kg (215 lb)  09/23/18 : 99.8 kg (220 lb)  08/28/18 : 98 kg (216 lb)  Estimated body mass index is 39.14 kg/m as calculated from the following:    Height as of 01/15/19: 5\' 2"  (1.575 m).    Weight as of 08/07/19: 97.1 kg (214 lb).    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

## 2019-09-25 NOTE — Telephone Encounter (Signed)
Appt scheduled

## 2019-09-29 ENCOUNTER — Other Ambulatory Visit: Payer: Self-pay

## 2019-09-29 ENCOUNTER — Ambulatory Visit: Payer: Medicare Other | Attending: Internal Medicine

## 2019-09-29 DIAGNOSIS — R809 Proteinuria, unspecified: Secondary | ICD-10-CM | POA: Insufficient documentation

## 2019-09-29 DIAGNOSIS — E1129 Type 2 diabetes mellitus with other diabetic kidney complication: Secondary | ICD-10-CM

## 2019-09-29 DIAGNOSIS — Z6839 Body mass index (BMI) 39.0-39.9, adult: Secondary | ICD-10-CM

## 2019-09-29 DIAGNOSIS — H903 Sensorineural hearing loss, bilateral: Secondary | ICD-10-CM | POA: Insufficient documentation

## 2019-09-29 DIAGNOSIS — M26609 Unspecified temporomandibular joint disorder, unspecified side: Secondary | ICD-10-CM | POA: Insufficient documentation

## 2019-09-29 LAB — BASIC METABOLIC PANEL
ANION GAP: 6 mmol/L (ref 5–15)
BUN (UREA NITROGEN): 12 mg/dL (ref 7–18)
CALCIUM: 9.5 mg/dL (ref 8.5–10.1)
CARBON DIOXIDE: 25 mmol/L (ref 21–32)
CHLORIDE: 108 mmol/L — ABNORMAL HIGH (ref 98–107)
CREATININE: 0.7 mg/dL (ref 0.4–1.2)
ESTIMATED GLOMERULAR FILT RATE: >60 mL/min (ref >60)
Glucose Random: 102 mg/dL (ref 74–160)
POTASSIUM: 4.5 mmol/L (ref 3.5–5.1)
SODIUM: 139 mmol/L (ref 136–145)

## 2019-09-29 LAB — THYROID SCREEN TSH REFLEX FT4: THYROID SCREEN TSH REFLEX FT4: 1.48 u[IU]/mL (ref 0.358–3.740)

## 2019-09-29 MED ORDER — BATH/SHOWER SEAT MISC
1.0000 | Freq: Every day | 0 refills | Status: AC
Start: 2019-09-29 — End: 2020-09-28

## 2019-09-29 NOTE — Progress Notes (Signed)
Labs drawn.  1 SST   [place], [place], 09/29/2019

## 2019-09-29 NOTE — Addendum Note (Signed)
Addended by: Driscilla Grammes. on: 09/29/2019 03:16 PM     Modules accepted: Orders

## 2019-09-30 ENCOUNTER — Telehealth (HOSPITAL_BASED_OUTPATIENT_CLINIC_OR_DEPARTMENT_OTHER): Payer: Self-pay | Admitting: Ambulatory Care

## 2019-09-30 DIAGNOSIS — M5417 Radiculopathy, lumbosacral region: Secondary | ICD-10-CM | POA: Diagnosis not present

## 2019-09-30 DIAGNOSIS — M47817 Spondylosis without myelopathy or radiculopathy, lumbosacral region: Secondary | ICD-10-CM | POA: Diagnosis not present

## 2019-09-30 DIAGNOSIS — Z79891 Long term (current) use of opiate analgesic: Secondary | ICD-10-CM | POA: Diagnosis not present

## 2019-09-30 DIAGNOSIS — Z9181 History of falling: Secondary | ICD-10-CM

## 2019-09-30 LAB — HEMOGLOBIN A1C
ESTIMATED AVERAGE GLUCOSE: 128 (ref 74–160)
HEMOGLOBIN A1C: 6.1 % — ABNORMAL HIGH (ref 4.0–5.6)

## 2019-09-30 NOTE — Telephone Encounter (Addendum)
[place]    Phone Number: [phone]              ARMANI GIESEKING [id number], 63 year old, female     Calls today: Clinical Questions ([place])    Name of person calling Teriyah   Specific nature of request Destinee went to [facility] at [address] in [place] to get the bath shower seat and was told that they need a new prescription please fax it to [phone]   Pt also wondering if the dr can write a prescription for walker with a seat and fax it to the same number. Thank you   Return phone number [phone]   Person calling on behalf of patient: Patient (self)         Patient's language of care: English     Patient does not need an interpreter.     Patient's PCP: Driscilla Grammes, MD      Returned call to pt   States her balance is off  She walked into [facility] to get shower chair   They need prescription faxed   Also   Requesting a walker with 4 wheels and a seat   She states this was discussed with Dr. [name] at her last visit     Faxed shower seat prescription to [facility]  Informed provider will be made aware of request for walker     Arnette Felts, RN, 09/30/2019

## 2019-10-01 NOTE — Telephone Encounter (Signed)
I cannot complete this rx from home  Please have in person provider write and print to be faxed  Or I can address Monday when in clinic  Let me know

## 2019-10-05 DIAGNOSIS — M47816 Spondylosis without myelopathy or radiculopathy, lumbar region: Secondary | ICD-10-CM | POA: Diagnosis not present

## 2019-10-08 ENCOUNTER — Other Ambulatory Visit: Payer: Self-pay

## 2019-10-08 ENCOUNTER — Encounter (HOSPITAL_BASED_OUTPATIENT_CLINIC_OR_DEPARTMENT_OTHER): Payer: Self-pay | Admitting: Ophthalmology

## 2019-10-08 ENCOUNTER — Ambulatory Visit: Payer: Medicare Other | Attending: Internal Medicine | Admitting: Ophthalmology

## 2019-10-08 DIAGNOSIS — H269 Unspecified cataract: Secondary | ICD-10-CM | POA: Diagnosis not present

## 2019-10-08 DIAGNOSIS — H524 Presbyopia: Secondary | ICD-10-CM | POA: Diagnosis not present

## 2019-10-08 DIAGNOSIS — H5213 Myopia, bilateral: Secondary | ICD-10-CM | POA: Diagnosis not present

## 2019-10-08 DIAGNOSIS — H52203 Unspecified astigmatism, bilateral: Secondary | ICD-10-CM | POA: Diagnosis not present

## 2019-10-08 DIAGNOSIS — E119 Type 2 diabetes mellitus without complications: Secondary | ICD-10-CM | POA: Diagnosis not present

## 2019-10-08 NOTE — Progress Notes (Signed)
64 year old female    Here for Diabetic eye exam to Evaluate for diabetic Retinopathy OU     Patient States that she had has blurry vision at distance and near for 1 year  (-) Flashes   (-) Floaters  (-) Eye Pain   (-) Redness and Irritation       Ocular History:  AODM Type II Dx 7-6 years  Cataract OU    Keratitis       Ocular Medications:   None     .HEMOGLOBIN A1C (%)       Date                     Value                 09/29/2019               6.1 (H)               11/05/2018               6.1 (H)               05/14/2018               6.3 (H)          ----------    No results found for: POCA1C    Macula OCT and Fundus photos done today.

## 2019-10-08 NOTE — Progress Notes (Signed)
For diabetic eye exam to evaluate for retinopathy    [place] hard to drive at night due to glare    HEMOGLOBIN A1C (%)   Date Value   09/29/2019 6.1 (H)   11/05/2018 6.1 (H)   05/14/2018 6.3 (H)       No results found for: POCA1C    Impression,    1.AODM, well controlled, No retinopathy    Fundus photos-no heme or exudates    Macula OCT-no thickening      2. Cortical cataract with glare    Doesn't feel safe driving at night due to glare    Plan-BAT test (Low, Medium, High with correction.)    3. Myopia, astigmatism, and presbyopia    Plan-she was refracted and given a prescription to get glasses that she can fill at any optical shop.    She was advised to wait on getting glasses until after glare evaluation

## 2019-10-21 ENCOUNTER — Telehealth (HOSPITAL_BASED_OUTPATIENT_CLINIC_OR_DEPARTMENT_OTHER): Payer: Self-pay | Admitting: Ambulatory Care

## 2019-10-21 NOTE — Telephone Encounter (Signed)
Requesting Phone Call - LETTER   Received: Today   Message Contents   [name]    Phone Number: [phone]              Samantha Cameron ML:[id number], 63 year old, female     Calls today: Clinical Questions ([place])    Name of person calling Patient   Specific nature of request Requesting letter - pt states that she needs a letter sent over to [employer] stating why she needs the CPAP machine.   Return phone number [phone] FAX: [phone]     Patient's language of care: English     Patient does not need an interpreter.     Patient's PCP: Driscilla Grammes, MD      Returned call to pt   States she spoke to earlier to try to speak to Dr. [name]   She called to get new tubing and they told her she had a balance she had to pay   She has not been using the Cpap as prescribed so insurance would not cover it     Pt states it was too much pressure previously   She states they lowered the pressure for her to 5   And now she can use it   She has not received new tubing yet     She states she also needs a walker with 4 wheels and a seat because her balance     Pt concerned that her blood sugar dropped to 84 and she became sweaty   Dr. [name] told her to cut back to 3 metformin per day but she has not done that yet   Daily blood sugars are:  92  88  82  Pt informed provider will be updated     Arnette Felts, RN, 10/21/2019

## 2019-10-28 DIAGNOSIS — M47816 Spondylosis without myelopathy or radiculopathy, lumbar region: Secondary | ICD-10-CM | POA: Diagnosis not present

## 2019-11-02 DIAGNOSIS — M47816 Spondylosis without myelopathy or radiculopathy, lumbar region: Secondary | ICD-10-CM | POA: Diagnosis not present

## 2019-11-04 ENCOUNTER — Other Ambulatory Visit (HOSPITAL_BASED_OUTPATIENT_CLINIC_OR_DEPARTMENT_OTHER): Payer: Self-pay | Admitting: Internal Medicine

## 2019-11-04 NOTE — Telephone Encounter (Signed)
PER Pharmacy, Samantha Cameron is a 63 year old female has requested a refill of Atorvastatin.        Last Office Visit: 09/25/19 with PCP  Last Physical Exam: 08/15/15     There are no preventive care reminders to display for this patient.     Statin Med:  Lipids   Cholesterol (mg/dL)   Date Value   11/05/2018 143     LOW DENSITY LIPOPROTEIN DIRECT (mg/dL)   Date Value   11/05/2018 70     HIGH DENSITY LIPOPROTEIN (mg/dL)   Date Value   11/05/2018 46     TRIGLYCERIDES (mg/dL)   Date Value   11/05/2018 229 (H)   LFTs   ALANINE AMINOTRANSFERASE (U/L)   Date Value   07/30/2018 32       ASPARTATE AMINOTRANSFERASE (U/L)   Date Value   07/30/2018 21       ALBUMIN (g/dL)   Date Value   07/30/2018 3.7       TOTAL PROTEIN (g/dL)   Date Value   07/30/2018 6.9       BILIRUBIN DIRECT (mg/dl)   Date Value   07/06/2018 0.1       BILIRUBIN TOTAL (mg/dL)   Date Value   07/30/2018 0.2       ALKALINE PHOSPHATASE (U/L)   Date Value   07/30/2018 92        Documented patient preferred pharmacies:    [facility] [place], [place]  Phone: [phone] Fax: [phone]

## 2019-11-25 DIAGNOSIS — M47816 Spondylosis without myelopathy or radiculopathy, lumbar region: Secondary | ICD-10-CM | POA: Diagnosis not present

## 2019-11-25 DIAGNOSIS — Z79891 Long term (current) use of opiate analgesic: Secondary | ICD-10-CM | POA: Diagnosis not present

## 2019-11-25 DIAGNOSIS — M5417 Radiculopathy, lumbosacral region: Secondary | ICD-10-CM | POA: Diagnosis not present

## 2019-12-01 DIAGNOSIS — M25561 Pain in right knee: Secondary | ICD-10-CM | POA: Diagnosis not present

## 2019-12-08 DIAGNOSIS — M1711 Unilateral primary osteoarthritis, right knee: Secondary | ICD-10-CM | POA: Diagnosis not present

## 2019-12-15 ENCOUNTER — Encounter (HOSPITAL_BASED_OUTPATIENT_CLINIC_OR_DEPARTMENT_OTHER): Payer: Self-pay

## 2019-12-15 ENCOUNTER — Telehealth (HOSPITAL_BASED_OUTPATIENT_CLINIC_OR_DEPARTMENT_OTHER): Payer: Self-pay

## 2019-12-15 DIAGNOSIS — M1711 Unilateral primary osteoarthritis, right knee: Secondary | ICD-10-CM | POA: Diagnosis not present

## 2019-12-15 NOTE — Telephone Encounter (Signed)
-----   Message from Josefa Half sent at 12/15/2019 12:09 PM EST -----  Regarding: DWO Follow up  Hi,    Patient is waiting for Lancets and Test strips. Has ran all out and has been going without for a few days. Do we have any updates on the DWOs that were submitted?    Thanks,  [employer]

## 2019-12-15 NOTE — Progress Notes (Signed)
Patient walked in for rx refill. Advised pt DWO has been sent and may take a few more days. I sent a follow up message to refill pool.

## 2019-12-15 NOTE — Telephone Encounter (Signed)
DWO form signed and faxed

## 2019-12-23 DIAGNOSIS — M25561 Pain in right knee: Secondary | ICD-10-CM | POA: Diagnosis not present

## 2019-12-23 DIAGNOSIS — M47816 Spondylosis without myelopathy or radiculopathy, lumbar region: Secondary | ICD-10-CM | POA: Diagnosis not present

## 2019-12-23 DIAGNOSIS — M5136 Other intervertebral disc degeneration, lumbar region: Secondary | ICD-10-CM | POA: Diagnosis not present

## 2019-12-23 DIAGNOSIS — Z96649 Presence of unspecified artificial hip joint: Secondary | ICD-10-CM | POA: Diagnosis not present

## 2020-01-01 ENCOUNTER — Other Ambulatory Visit (HOSPITAL_BASED_OUTPATIENT_CLINIC_OR_DEPARTMENT_OTHER): Payer: Self-pay | Admitting: Pediatrics

## 2020-01-11 DIAGNOSIS — M5417 Radiculopathy, lumbosacral region: Secondary | ICD-10-CM | POA: Diagnosis not present

## 2020-01-14 ENCOUNTER — Encounter (HOSPITAL_BASED_OUTPATIENT_CLINIC_OR_DEPARTMENT_OTHER): Payer: Self-pay | Admitting: Ophthalmology

## 2020-01-14 ENCOUNTER — Ambulatory Visit: Payer: Medicare Other | Attending: Ophthalmology | Admitting: Ophthalmology

## 2020-01-14 ENCOUNTER — Ambulatory Visit (HOSPITAL_BASED_OUTPATIENT_CLINIC_OR_DEPARTMENT_OTHER): Payer: Self-pay | Admitting: Ophthalmology

## 2020-01-14 ENCOUNTER — Other Ambulatory Visit: Payer: Self-pay

## 2020-01-14 DIAGNOSIS — H25012 Cortical age-related cataract, left eye: Secondary | ICD-10-CM | POA: Diagnosis not present

## 2020-01-14 NOTE — Progress Notes (Signed)
64 years old diabetic female with cortical cataract OU presents for BAT test.    States vision stable OU. No pain.

## 2020-01-14 NOTE — Progress Notes (Signed)
For evaluation of glare/evaluate cataract    Doesn't feel safe driving due to glare    Impression,    1. Cortical cataract -vision drops to 20/50 with increased light    Plan-options discussed with patient.       Samantha Cameron was seen at the [facility] for an eye exam.    The patient has noticed decreasing vision and glare both eye.    The patient was examined and found to a visually significant cataract in both eyes.    She  was having difficulty seeing for her activities of daily living.    The risk, benefits, and alternatives of cataract surgery have been discussed with the patient.    She  would like to have cataract surgery on both eyes.  The patient will be returning for preoperative eye measurements and seeing her PCP for preoperative medical clearance.    Lens options including "premium," toric, and standard monovision intraocular lens options were discussed at length.        Plan-Cataract surgery OS then OD      Diabetes-had recent dilated fundus exam.

## 2020-01-15 DIAGNOSIS — M79672 Pain in left foot: Secondary | ICD-10-CM | POA: Diagnosis not present

## 2020-01-15 DIAGNOSIS — M2012 Hallux valgus (acquired), left foot: Secondary | ICD-10-CM | POA: Diagnosis not present

## 2020-01-15 DIAGNOSIS — M10072 Idiopathic gout, left ankle and foot: Secondary | ICD-10-CM

## 2020-01-15 DIAGNOSIS — M779 Enthesopathy, unspecified: Secondary | ICD-10-CM | POA: Diagnosis not present

## 2020-01-15 DIAGNOSIS — M7752 Other enthesopathy of left foot: Secondary | ICD-10-CM

## 2020-01-20 ENCOUNTER — Other Ambulatory Visit (HOSPITAL_BASED_OUTPATIENT_CLINIC_OR_DEPARTMENT_OTHER): Payer: Self-pay | Admitting: Internal Medicine

## 2020-01-20 DIAGNOSIS — M5136 Other intervertebral disc degeneration, lumbar region: Secondary | ICD-10-CM | POA: Diagnosis not present

## 2020-01-20 DIAGNOSIS — M25562 Pain in left knee: Secondary | ICD-10-CM | POA: Diagnosis not present

## 2020-01-20 DIAGNOSIS — E1129 Type 2 diabetes mellitus with other diabetic kidney complication: Secondary | ICD-10-CM

## 2020-01-20 DIAGNOSIS — Z79891 Long term (current) use of opiate analgesic: Secondary | ICD-10-CM | POA: Diagnosis not present

## 2020-01-20 DIAGNOSIS — M25561 Pain in right knee: Secondary | ICD-10-CM | POA: Diagnosis not present

## 2020-01-20 NOTE — Telephone Encounter (Signed)
PER Pharmacy, Samantha Cameron is a 64 year old female has requested a refill of metformin.      Last Office Visit: 09/25/2019 with Lenore Cordia  Last Physical Exam: 08/15/2015    A1C due on 12/30/2019    Other Med Adult:  Most Recent BP Reading(s)  08/07/19 : 117/79        Cholesterol (mg/dL)   Date Value   11/05/2018 143     LOW DENSITY LIPOPROTEIN DIRECT (mg/dL)   Date Value   11/05/2018 70     HIGH DENSITY LIPOPROTEIN (mg/dL)   Date Value   11/05/2018 46     TRIGLYCERIDES (mg/dL)   Date Value   11/05/2018 229 (H)         THYROID SCREEN TSH REFLEX FT4 (uIU/mL)   Date Value   09/29/2019 1.480         No results found for: TSH    HEMOGLOBIN A1C (%)   Date Value   09/29/2019 6.1 (H)       No results found for: POCA1C      INR (no units)   Date Value   04/18/2017 1.0   05/27/2016 1.0   02/01/2012 < 1.0 (L)       SODIUM (mmol/L)   Date Value   09/29/2019 139       POTASSIUM (mmol/L)   Date Value   09/29/2019 4.5           CREATININE (mg/dL)   Date Value   09/29/2019 0.7       Documented patient preferred pharmacies:    [facility] - [address], [place]  Phone: [phone] Fax: [phone]

## 2020-01-22 ENCOUNTER — Other Ambulatory Visit (HOSPITAL_BASED_OUTPATIENT_CLINIC_OR_DEPARTMENT_OTHER): Payer: Self-pay | Admitting: Podiatry

## 2020-01-22 ENCOUNTER — Ambulatory Visit
Admission: RE | Admit: 2020-01-22 | Discharge: 2020-01-22 | Disposition: A | Discharge status: Home / Self Care | Payer: Medicare Other | Attending: Internal Medicine | Admitting: Internal Medicine

## 2020-01-22 ENCOUNTER — Ambulatory Visit (HOSPITAL_BASED_OUTPATIENT_CLINIC_OR_DEPARTMENT_OTHER): Admit: 2020-01-22 | Discharge: 2020-01-22 | Disposition: A | Discharge status: Home / Self Care | Payer: Medicare Other

## 2020-01-22 ENCOUNTER — Other Ambulatory Visit: Payer: Self-pay

## 2020-01-22 DIAGNOSIS — M2012 Hallux valgus (acquired), left foot: Secondary | ICD-10-CM | POA: Diagnosis not present

## 2020-01-22 DIAGNOSIS — M21619 Bunion of unspecified foot: Secondary | ICD-10-CM | POA: Insufficient documentation

## 2020-01-22 LAB — URIC ACID: URIC ACID: 5.7 mg/dL (ref 2.6–6.0)

## 2020-01-22 LAB — RBC SEDIMENTATION RATE: RBC SEDIMENTATION RATE: 32 MM/HR — ABNORMAL HIGH (ref 0–30)

## 2020-01-23 ENCOUNTER — Other Ambulatory Visit (HOSPITAL_BASED_OUTPATIENT_CLINIC_OR_DEPARTMENT_OTHER): Payer: Self-pay | Admitting: Internal Medicine

## 2020-01-23 MED ORDER — CYCLOBENZAPRINE HCL 5 MG PO TABS
5.00 mg | ORAL_TABLET | Freq: Three times a day (TID) | ORAL | 0 refills | Status: AC | PRN
Start: 2020-01-23 — End: 2020-01-28

## 2020-01-23 NOTE — Progress Notes (Signed)
Spoke with patient as on call provider saturday around 330 pm  Since 8 pm last night has had left calf cramp  No swelling, no color change, no temp change in foot, nl sensation  Is same size as other calf  Notes the muscle is tight  Heating pad helsp a bit, mssage not helping much    Has had cramps before, but not this bad  Foot is turning in  Couldn't sleep all night    No new meds or supplements  She does have dm and spinal stenosis  Prior labs do not show any obviousy eleectrolyte abnormalities    Trying something otc from pharmacy  Discsued, unclear etiology  Can try a muscle relaxant, but expalined I  think this will not help too much   Trial flexeril    If not improving, or is worsening over the weekend  Or loss of warmth, sensation, worsening pain, sensation  Should go to ED For further eval - and to see if anythign more serious could be causing it  Could be related to spinal stenosis  Unlikely blood clot given description

## 2020-01-25 ENCOUNTER — Encounter (HOSPITAL_BASED_OUTPATIENT_CLINIC_OR_DEPARTMENT_OTHER): Payer: Self-pay

## 2020-01-25 ENCOUNTER — Other Ambulatory Visit: Payer: Self-pay

## 2020-01-25 ENCOUNTER — Ambulatory Visit (HOSPITAL_BASED_OUTPATIENT_CLINIC_OR_DEPARTMENT_OTHER): Payer: Self-pay | Admitting: Ambulatory Care

## 2020-01-25 ENCOUNTER — Emergency Department (HOSPITAL_BASED_OUTPATIENT_CLINIC_OR_DEPARTMENT_OTHER): Payer: Medicare Other

## 2020-01-25 ENCOUNTER — Encounter (HOSPITAL_BASED_OUTPATIENT_CLINIC_OR_DEPARTMENT_OTHER): Payer: Self-pay | Admitting: Physician Assistant

## 2020-01-25 ENCOUNTER — Ambulatory Visit (HOSPITAL_BASED_OUTPATIENT_CLINIC_OR_DEPARTMENT_OTHER): Payer: Medicare Other | Admitting: Physician Assistant

## 2020-01-25 ENCOUNTER — Emergency Department
Admission: EM | Admit: 2020-01-25 | Discharge: 2020-01-25 | Disposition: A | Discharge status: Home / Self Care | Payer: Medicare Other | Source: Intra-hospital | Attending: Emergency Medicine | Admitting: Emergency Medicine

## 2020-01-25 VITALS — BP 103/74 | HR 120 | Temp 97.9°F | Wt 221.0 lb

## 2020-01-25 DIAGNOSIS — R000 Tachycardia, unspecified: Secondary | ICD-10-CM

## 2020-01-25 DIAGNOSIS — I471 Supraventricular tachycardia: Secondary | ICD-10-CM | POA: Diagnosis not present

## 2020-01-25 DIAGNOSIS — M79662 Pain in left lower leg: Secondary | ICD-10-CM | POA: Diagnosis not present

## 2020-01-25 DIAGNOSIS — M7989 Other specified soft tissue disorders: Secondary | ICD-10-CM | POA: Diagnosis not present

## 2020-01-25 DIAGNOSIS — Z87891 Personal history of nicotine dependence: Secondary | ICD-10-CM | POA: Diagnosis not present

## 2020-01-25 DIAGNOSIS — M79661 Pain in right lower leg: Secondary | ICD-10-CM | POA: Diagnosis present

## 2020-01-25 DIAGNOSIS — J449 Chronic obstructive pulmonary disease, unspecified: Secondary | ICD-10-CM | POA: Diagnosis not present

## 2020-01-25 LAB — CBC, PLATELET & DIFFERENTIAL
ABSOLUTE BASO COUNT: 0.1 10*3/uL (ref 0.0–0.1)
ABSOLUTE EOSINOPHIL COUNT: 0.3 10*3/uL (ref 0.0–0.8)
ABSOLUTE IMM GRAN COUNT: 0.07 10*3/uL — ABNORMAL HIGH (ref 0.00–0.03)
ABSOLUTE LYMPH COUNT: 3.3 10*3/uL (ref 0.6–5.9)
ABSOLUTE MONO COUNT: 1.1 10*3/uL (ref 0.2–1.4)
ABSOLUTE NEUTROPHIL COUNT: 8 10*3/uL (ref 1.6–8.3)
ABSOLUTE NRBC COUNT: 0 10*3/uL (ref 0.0–0.0)
BASOPHIL %: 0.5 % (ref 0.0–1.2)
EOSINOPHIL %: 2.5 % (ref 0.0–7.0)
HEMATOCRIT: 43.5 % (ref 34.1–44.9)
HEMOGLOBIN: 13.8 g/dL (ref 11.2–15.7)
IMMATURE GRANULOCYTE %: 0.5 % — ABNORMAL HIGH (ref 0.0–0.4)
LYMPHOCYTE %: 25.7 % (ref 15.0–54.0)
MEAN CORP HGB CONC: 31.7 g/dL (ref 31.0–37.0)
MEAN CORPUSCULAR HGB: 29.9 pg (ref 26.0–34.0)
MEAN CORPUSCULAR VOL: 94.4 fl (ref 80.0–100.0)
MEAN PLATELET VOLUME: 10.5 fL (ref 8.7–12.5)
MONOCYTE %: 8.4 % (ref 4.0–13.0)
NEUTROPHIL %: 62.4 % (ref 40.0–75.0)
NRBC %: 0 % (ref 0.0–0.0)
PLATELET COUNT: 315 10*3/uL (ref 150–400)
RBC DISTRIBUTION WIDTH STD DEV: 51 fL — ABNORMAL HIGH (ref 35.1–46.3)
RED BLOOD CELL COUNT: 4.61 M/uL (ref 3.90–5.20)
WHITE BLOOD CELL COUNT: 12.7 10*3/uL — ABNORMAL HIGH (ref 4.0–11.0)

## 2020-01-25 LAB — URINE PREGNANCY TEST (POINT OF CARE): HCG QUALITATIVE URINE: NEGATIVE

## 2020-01-25 LAB — BASIC METABOLIC PANEL
ANION GAP: 9 mmol/L (ref 5–15)
BUN (UREA NITROGEN): 18 mg/dL (ref 7–18)
CALCIUM: 9 mg/dL (ref 8.5–10.1)
CARBON DIOXIDE: 28 mmol/L (ref 21–32)
CHLORIDE: 104 mmol/L (ref 98–107)
CREATININE: 0.8 mg/dL (ref 0.4–1.2)
ESTIMATED GLOMERULAR FILT RATE: >60 mL/min (ref >60)
Glucose Random: 95 mg/dL (ref 74–160)
POTASSIUM: 4.8 mmol/L (ref 3.5–5.1)
SODIUM: 141 mmol/L (ref 136–145)

## 2020-01-25 LAB — TROPONIN I: TROPONIN I: <0.02 ng/mL (ref 0.00–0.04)

## 2020-01-25 MED ORDER — ACETAMINOPHEN 325 MG PO TABS
650.0000 mg | ORAL_TABLET | Freq: Once | ORAL | Status: DC
Start: 2020-01-25 — End: 2020-01-25

## 2020-01-25 NOTE — ED Notes (Signed)
Bed: 10-A  Expected date:   Expected time:   Means of arrival:   Comments:  Svt

## 2020-01-25 NOTE — Progress Notes (Signed)
Samantha Cameron is a 64 year old female patient of Dr. [name] who presents today with muscle cramping     Bilateral leg cramping L>>R  Initially started in left leg with severe cramping   Pain started suddenly  Very bad cramp   Some swelling in leg   Spoke with on call provider, prescribed flexeril - not sure this is helping   Has had leg cramps/foot cramps previously but not this severe   No injuries   No change in skin appearance   Thinks leg was previously swollen and was very tight   Compared to Friday the symptoms are starting to improve   No changes in diet   Has been staying hydrated   No vomiting or diarrhea   No new medication changes   No fevers   Sore when walking   No chest pain   No palpitations   No SOB   No changes in breathing   No new weakness   No new numbness       Calf diameter 46 cm left, 46.5 cm right     Additional ROS:  All others reviewed and negative.    PMH, SH and FM reviewed     Physical exam:  BP 103/74    Pulse 120    Temp 97.9 F (36.6 C) (Temporal)    Wt 100.2 kg (221 lb)    LMP 10/22/2007 (LMP Unknown)    SpO2 97%    BMI 40.42 kg/m     Constitutional: Not in acute distress.  HEENT: Normal conjunctiva and intact EOM. PERRLA.   Neck: Supple. No lymphadenopathy.    Cardiovascular: Tachycardic rate, rhythm, normal S1/ S2 heart sounds.  Pulmonary/Chest: Effort normal and breath sounds normal. No respiratory distress.   Musculoskeletal: Normal range of motion. Bilateral LE moderate tenderness and spasm in bilateral posterior calves, no obvious swelling, engorged veins, skin changes, erythema or warmth, slow gait   Neurological:  Alert and oriented.   Psychiatric: Normal mood and affect.   Skin: Skin is dry and intact.     EKG: sinus tachycardia, EKG with significant changes from prior, rate appears normal but there seem to be 2 wave forms, possibly multiple PVCs      Assessment and Plan:  (R00.0) Tachycardia  (primary encounter diagnosis)  Comment: initially scheduled for leg pain but  found to be tachycardic ~120-150s on exam. Patient has hx of tachycardia in the past, previously evaluated by cardiology with last holter monitor in 2019, HR typically controlled with BB. No recent missed dose of metoprolol, patient denies any current chest pain, SOB, palpitations, nausea. EKG obtained due to tachycardia and found to have significant changes from previous, rate appears regular but seems to have 2 separate wave forms and possible PVCs. Due to EKG changes and tachycardia EMS called for ED evaluation.   Plan: EKG  ED expect called, patient transferred to [facility] ED via EMS    [phone],  [phone]) Bilateral calf pain  Comment: likely musculoskeletal/muscle spasm however given new onset tachycardia will likely need DVT/PE r/o. Electrolyte abnormality, dehydration may be contributing   Plan: transferred to [facility] ED       We discussed the patients current medications. The patient expressed understanding and no barriers to adherence were identified.   1. The patient indicates understanding of these issues and agrees with the plan. Brief care plan is updated and reviewed with the patient.   2. The patient is given an After Visit Summary sheet that  lists all medications with directions, allergies, orders placed during this encounter, and follow-up instructions.   3. I reviewed the patient's medical information and medical history.   4. I reconciled the patient's medication list and prepared and supplied needed refills.   5. I have reviewed the past medical, family, and social history sections including the medications and allergies    Verlene Mayer, PA-C  [facility]

## 2020-01-25 NOTE — ED Triage Note (Signed)
Patient presents from local clinic with tachycardia. Patient reports that she was at clinic for follow up on leg pain and they found her to be tachycardic. Patient denies any associated symptoms, EMS reports rate between 120-200. Patient in no obvious distress at this time

## 2020-01-25 NOTE — Narrator Note (Signed)
IV D/C'd and D/C papers reviewed with pt and verbalized understanding. Pt amb out steady

## 2020-01-25 NOTE — ED Provider Notes (Signed)
EMERGENCY DEPARTMENT RESIDENT NOTE    The ED nursing record was reviewed.   The prior medical records as available electronically through Epic were reviewed.  This patient was seen with Emergency Department attending physician Dr. Jani Files    CHIEF COMPLAINT    Patient presents with:  Palpitations  Leg Pain      HPI    Samantha Cameron is a 64 year old female PMH palpitations followed by cardiology on metropolol, COPD, DM2 not requiring insulin presenting with ? Tachycardia. Pt was at routine appointment today, they found her HR to be "between 120 and 200" and referred her to emergency department for further management. Pt never had chest pain, shortness of breath during these episodes. Pt just feels "like her heart is racing" similar to episodes she has had in the past. No fever or chills. Pt does endorse swelling of the R leg and intermittent cramps of the L leg.      REVIEW OF SYSTEMS     The pertinent positives are reviewed in the HPI above. All other systems were reviewed and are negative.    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) ([place])  09/14/2015: Cough  No date: Depression      Comment:  per steward records  No date: Diabetes  No date: Disorders of lipoid metabolism  05/23/2018: Epigastric pain      Comment:  05/2018 GI: Assessment: Pt is 17F with  PMH COPD, morbid                obesity, H.pylori (2009, unclear if treated) who p/w                severe GERD symptoms, unintentional weight loss (~20 lbs                since 03/2018) and abdominal pain.  1. GERD - severe 2.                Unintentional weight loss 3. Abdominal pain   Plan: 1.                Recommend EGD. This procedure has been fully reviewed                with the patient and written informed consent has been                obtained. 2.   No date: Esophageal reflux  No date: External hemorrhoid      Comment:  per steward records  04/19/2017: Gastritis without bleeding  Comment:  GI endoscopy: 06/2018: Mildly severe reflux esophagitis.               Biopsied.      - Chronic gastritis. Biopsied.      - No                gross lesions in the entire examined duodenum.                Complications:  No immediate complications.                Recommendation:      - Await pathology results. 7/2019GI:               Assessment: Pt is 48F with PMH COPD, morbid obesity,                H.pylori (2009, unclear if treated) who p/w severe GERD                symptoms, unintentional weigh  05/10/2018: Gastroesophageal reflux disease without esophagitis  No date: HTN (hypertension)  02/01/2012: Hypoxia  02/16/2017: Left ear pain  06/07/2015: Left genital labial abscess  06/16/2015: Obesity, Class III, 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y) ([place])  08/16/2015: Osteopenia      Comment:  On xray of left knee per steward records 06/2014 Unclear                if addressed  05/23/2018: Pancreatic lesion  09/14/2015: Right ear impacted cerumen  No date: Sleep apnea  08/15/2015: Type 2 diabetes mellitus without complication, without   long-term current use of insulin (HCC)      Comment:   HEMOGLOBIN A1C (%) Date Value 07/15/2015 6.2 (H)                ----------  Steward record - a1c 6.2 on 01/31/15 6.4 on                09/30/14 5.9 on 12/29/13 6.4 08/18/13   11/18/2017: Viral URI with cough    PROBLEM LIST  Patient Active Problem  List:     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) ([place])     Tobacco use disorder     Spinal stenosis of lumbar region     HLD (hyperlipidemia)     Chronic bilateral low back pain     Skin change     Pineal gland cyst     History of vitamin D deficiency     Internal hemorrhoids     Venous (peripheral) insufficiency     Adrenal adenoma     Type 2 diabetes mellitus with microalbuminuria, without long-term current use of insulin (HCC)     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 (HCC)     Acute pain of right wrist     Abnormal PFTs     Screening for malignant neoplasm of breast     Skin irritation     H/O bone density study     Vertigo     Orthostatic hypotension     Social problem     Right arm weakness     Insomnia     Acute pain of left knee     Hx of total knee replacement, left     Acute bacterial sinusitis     Seasonal allergic rhinitis     Left foot pain     Stress due to family tension     Leucocytosis  Viral URI     Chronic gastritis     Common bile duct dilation     Fatty liver     Pancreatic lesion     Vascular calcification     Umbilical hernia without obstruction and without gangrene     Arthritis of spine     Scoliosis of thoracic spine     Anxiety     Sleep disturbance     Reflux esophagitis     First degree AV block     Obstructive sleep apnea     Unintentional weight loss     Abdominal pain     Fibroid     Endometrial thickening on ultrasound     ASCUS of cervix with negative high risk HPV     Endometrial polyp     Panic disorder     Chest congestion     Adenomatous polyp of colon     Microalbuminuria     BMI 39.0-39.9,adult     Temporomandibular joint disorder     Sensorineural hearing loss (SNHL) of both ears     Cortical senile cataract, left      SURGICAL HISTORY    Past Surgical History:  No date: ANES NERVE MUSC TENDON FASCIA&BURSA KNEE&/POPLT  No date: FOOT SURGERY      Comment:  bilateral  No date: LAPAROSCOPY SURG CHOLECYSTECTOMY  No date: OB ANTEPARTUM  CARE CESAREAN DLVR & POSTPARTUM      Comment:  x3  No date: TONSILLECTOMY & ADENOIDECTOMY <AGE 49  04/13/2009: TOTAL KNEE REPLACEMENT      Comment:  left  08/11/2009: WRIST GANGLION EXCISION      Comment:  left     CURRENT MEDICATIONS    No current facility-administered medications for this encounter.     Current Outpatient Medications:     cyclobenzaprine (FLEXERIL) 5 MG tablet, Take 1 tablet by mouth 3 (three) times daily as needed  for up to 5 days, Disp: 15 tablet, Rfl: 0    metFORMIN (GLUCOPHAGE-XR) 500 MG 24 hr tablet, take 4 tablets by mouth once daily with BREAKFAST, Disp: 360 tablet, Rfl: 3    atorvastatin (LIPITOR) 20 MG tablet, take 1 tablet by mouth once daily, Disp: 90 tablet, Rfl: 3    Misc. Devices (BATH/SHOWER SEAT) MISC Device, 1 Device by Other route daily, Disp: 1 each, Rfl: 0    albuterol (PROVENTIL) (2.5 MG/3ML) 0.083% nebulizer solution, Take 3 mLs by nebulization every 6 (six) hours as needed for Wheezing Dx: Code: J44.9, Disp: 75 mL, Rfl: 1    FLUoxetine (PROZAC) 20 MG capsule, Take 1 capsule by mouth daily, Disp: 30 capsule, Rfl: 2    metoprolol (TOPROL-XL) 25 MG 24 hr tablet, Take 1 tablet by mouth daily, Disp: 90 tablet, Rfl: 3    lisinopril (ZESTRIL) 20 MG tablet, Take 1 tablet by mouth daily, Disp: 90 tablet, Rfl: 3    Omega-3 Fatty Acids (FISH OIL) 1000 MG CAPS, Take by mouth, Disp: , Rfl:     cholecalciferol (VITAMIN D3) 1000 UNIT tablet, Take 1 tablet by mouth daily, Disp: , Rfl:     pregabalin (LYRICA) 100 MG capsule, Take 100 mg by mouth as needed With Dr Dwyane Dee   , Disp: , Rfl:     oxycodone-acetaminophen (PERCOCET) 5-325 MG per tablet, Take 1 tablet by mouth 2 (two) times daily as needed With Dr Dwyane Dee, Disp: , Rfl:     aspirin 81 MG tablet, Take 81 mg by mouth daily., Disp: , Rfl:  ALLERGIES    Review of Patient's Allergies indicates:   Codeine camsylate       Shortness of Breath, Other (See                            Comments)    Comment:Chest pain   Motrin  [ibuprofen]      Nausea Only   Sulfa antibiotics       Other (See Comments)   Bactrim                 Rash, Itching    FAMILY HISTORY    Review of patient's family history indicates:  Problem: Diabetes      Relation: Mother          Age of Onset: (Not Specified)  Problem: Heart      Relation: Brother          Age of Onset: (Not Specified)          Comment: CAD and AAA  Problem: Hypertension      Relation: Mother          Age of Onset: (Not Specified)  Problem: Arthritis      Relation: Mother          Age of Onset: (Not Specified)  Problem: Stroke      Relation: Mother          Age of Onset: (Not Specified)  Problem: Diabetes      Relation: Brother          Age of Onset: (Not Specified)          Comment: same brother  Problem: Cancer - Other      Relation: Father          Age of Onset: (Not Specified)          Comment: type uncertain  Problem: Thyroid      Relation: Sister          Age of Onset: (Not Specified)  Problem: Alcohol/Drug Abuse      Relation: Son          Age of Onset: (Not Specified)  Problem: OTHER      Relation: Son          Age of Onset: (Not Specified)          Comment: ?brain aneurysm per steward records  Problem: No Known Problems      Relation: Grandchild          Age of Onset: (Not Specified)          Comment: 6  Problem: Cancer - Breast      Relation: FamHxNeg          Age of Onset: (Not Specified)  Problem: Cancer - Cervical      Relation: FamHxNeg          Age of Onset: (Not Specified)  Problem: Cancer - Ovarian      Relation: FamHxNeg          Age of Onset: (Not Specified)  Problem: Cancer - Colon      Relation: FamHxNeg          Age of Onset: (Not Specified)  Problem: Psychiatric Illness      Relation: FamHxNeg          Age of Onset: (Not Specified)      SOCIAL HISTORY    Social History     Socioeconomic History    Marital  status: Divorced     Spouse name: Not on file    Number of children: Not on file    Years of education: Not on file    Highest education level: Not on file    Occupational History    Not on file   Social Needs    Financial resource strain: Not on file    Food insecurity     Worry: Not on file     Inability: Not on file    Transportation needs     Medical: Not on file     Non-medical: Not on file   Tobacco Use    Smoking status: Former Smoker     Packs/day: 0.50     Years: 31.00     Pack years: 15.50     Types: Cigarettes     Quit date: 03/10/2018     Years since quitting: 1.8    Smokeless tobacco: Never Used    Tobacco comment: quit smoking inform provided to patient   Substance and Sexual Activity    Alcohol use: No    Drug use: No    Sexual activity: Not Currently     Partners: Male     Birth control/protection: Tubal Ligation   Lifestyle    Physical activity     Days per week: Not on file     Minutes per session: Not on file    Stress: Not on file   Relationships    Social connections     Talks on phone: Not on file     Gets together: Not on file     Attends religious service: Not on file     Active member of club or organization: Not on file     Attends meetings of clubs or organizations: Not on file     Relationship status: Not on file    Intimate partner violence     Fear of current or ex partner: Not on file     Emotionally abused: Not on file     Physically abused: Not on file     Forced sexual activity: Not on file   Other Topics Concern    Not on file   Social History Narrative    Lives with son    1 daughter and another son passed away    Disabled from back    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 Simons,MD, 07/15/2015, 2:56 PM              PHYSICAL EXAM      Vital Signs: BP 100/67    Pulse 103    Temp 98.9 F    Resp 19    LMP 10/22/2007 (LMP Unknown)    SpO2 99%      Constitutional: Well-developed, Well-nourished, Non-toxic  appearance. Speaking full sentences.  Cardio.: RRR, No MRG's. Radial pulses 2+ B/L;  No LE edema  Pulmonary: Normal BS's bilat., No tachypnea, wheezes, rales or rhonchi;  Non-labored w/o retractions or accessory muscle use, or tripoding  Abd. + BS throughout. Soft, NTND, No masses, rebound or guarding. No Murphy's, McBurney's, or Rovsing's.   Musculoskeletal:  Moving all 4 extremities, No major  deformities noted. Ambulatory with steady gait.  Neurological: CNII-XII grossly intact, AOx3, No focal deficits noted.   Psychiatric: Appropriate for age and situation.        RESULTS  Results for orders placed or performed during the hospital encounter of 01/25/20 (from the past 24 hour(s))   CBC, Platelet & Differential    Collection Time: 01/25/20  4:21 PM   Result Value    WHITE BLOOD CELL COUNT 12.7 (H)    RED BLOOD CELL COUNT 4.61    HEMOGLOBIN 13.8    HEMATOCRIT 43.5    MEAN CORPUSCULAR VOL 94.4    MEAN CORPUSCULAR HGB 29.9    MEAN CORP HGB CONC 31.7    RBC DISTRIBUTION WIDTH STD DEV 51.0 (H)    PLATELET COUNT 315    MEAN PLATELET VOLUME 10.5    NEUTROPHIL % 62.4    IMMATURE GRANULOCYTE % 0.5 (H)    LYMPHOCYTE % 25.7    MONOCYTE % 8.4    EOSINOPHIL % 2.5    BASOPHIL % 0.5    NRBC % 0.0    ABSOLUTE NEUTROPHIL COUNT 8.0    ABSOLUTE IMM GRAN COUNT 0.07 (H)    ABSOLUTE LYMPH COUNT 3.3    ABSOLUTE MONO COUNT 1.1    ABSOLUTE EOSINOPHIL COUNT 0.3    ABSOLUTE BASO COUNT 0.1    ABSOLUTE NRBC COUNT 0.0   Basic Metabolic Panel    Collection Time: 01/25/20  4:21 PM   Result Value    SODIUM 141    POTASSIUM 4.8    CHLORIDE 104    CARBON DIOXIDE 28    ANION GAP 9    CALCIUM 9.0    Glucose Random 95    BUN (UREA NITROGEN) 18    CREATININE 0.8    ESTIMATED GLOMERULAR FILT RATE > 60        RADIOLOGY  CXR without signs of pneumonia or PTX    EKG:  Multiple EKGs taken with P waves throughout. Rate up to 160 at times, 100 for most EKGs. Rate related worsening of RBBB which was not present on previous XR. Borderline QT prolongation.      PROCEDURES      MEDICATIONS ADMINISTERED ON THIS VISIT  No orders of the defined types were placed in this encounter.      ED COURSE & MEDICAL DECISION MAKING      I reviewed the patient's past medical history/problem list, past surgical history, medication list, social history and allergies.    ED Decision Making & Course: Samantha Cameron is a 64 year old female PMH palpitations followed by cardiology on metropolol, COPD, DM2 not requiring insulin presenting with ? Tachycardia. Once patient was placed on the monitors, it was clear that she had large T waves that were being double counted. EKGs with a more normal rate, however rate-related worsening of RBBB she did not have previously. Concern for MAT given COPD diagnosis, tachycardia. Pt has not been symptomatic here in the ED. Bloodwork wnl. Will ultrasound legs for ? DVT that would be contributing to her tachycardia, will tbw cardiologist if negative for further management/workup.     Spoke with cardiology, they thought the rate was closer to 100 and machine was double counting, thought that RBBB was rate related widening and not anything acute. [place] without sign of DVT. Pt has been stable throughout time in the ED. Cardiology reached out to PCP regarding event monitor, repeat echo. I provided pt with referral to cardiology. Pt understood  plan and discharge precautions. Pt remained hemodynamically stable during their stay in the emergency department.     Follow Up: PCP    Clinical Impression:  No diagnosis found.    Disposition:   None    Patient Condition:  None

## 2020-01-25 NOTE — Narrator Note (Signed)
Pt just returned from US

## 2020-01-25 NOTE — Progress Notes (Signed)
Patient's son Samuel Jester is to come pick up patient's car keys in about 15 minutes, left keys at front desk.

## 2020-01-25 NOTE — Discharge Instructions (Signed)
You were evaluated today for a fast heart rate. While you were here, we performed multiple EKGs that were around a rate of 100. As we spoke about, your EKGs are slightly abnormal, and that causes the machine to double count the rate closer to 200. We spoke with cardiology, who recommended following up with them. They will provide suggestions to your PCP for orders to place in the meantime so they can better evaluate what is happening. Please return to the ED if you have worsening chest pain, shortness of breath, leg swelling, fever or chills, or feel as though you are going to pass out.

## 2020-01-25 NOTE — Telephone Encounter (Signed)
Alinda Deem, MD  [facility] Rn Pool; Suann Larry. [name], MD            Hi!  Just took a weekend call from this patient for muscle cramps   I was unable to forward it to the pool   Could someone check in on her Monday and see how she is doing   Thanks   L      Reached out to triage pt after she contacted on call provider due to leg cramps     Spoke to pt   States Friday night she was in agony   + cramping in calf down left calf to foot   Paged on -cal doctor who called in flexeril for her   States she is taking flexeril   States her ability to walk improved, pain was less however  cramping then began in her right  Lower leg last night   Severe up to her thigh   States she can feel the muscle tightening   States this has happened in the past   Was never informed of reason why   Used icy hot topically with some relief     Pt denies swelling   Denies recent injury or trauma    Denies Discoloration   Feet warm to touch   Pt concerned because she is scheduled fro cataract surgery 3/9  Screening for possible COVID:    1. In the past 14 days, has the patient been exposed to [place] or told to quarantine because of a COVID exposure?  NO  2. In the past 14 days, has the patient had new or worsening (for chronic symptoms) fever, cough, shortness of breath, body aches, runny nose, sore throat, nausea, diarrhea, or lack of smell/taste? NO  3. In the past 14 days, has the patient had COVID or suspected COVID?  NO    In person appointment scheduled     Arnette Felts, RN, 01/25/2020

## 2020-01-25 NOTE — Narrator Note (Signed)
Patient Disposition  Patient education for diagnosis, medications, activity, diet and follow-up.  Patient left ED 6:47 PM.  Patient rep received written instructions.    Interpreter to provide instructions: No    Patient belongings with patient: YES    Have all existing LDAs been addressed? Yes    Have all IV infusions been stopped? Yes    Destination: Discharged to home

## 2020-01-26 ENCOUNTER — Telehealth (HOSPITAL_BASED_OUTPATIENT_CLINIC_OR_DEPARTMENT_OTHER): Payer: Self-pay | Admitting: Physician Assistant

## 2020-01-26 DIAGNOSIS — R9431 Abnormal electrocardiogram [ECG] [EKG]: Secondary | ICD-10-CM

## 2020-01-26 DIAGNOSIS — R000 Tachycardia, unspecified: Secondary | ICD-10-CM

## 2020-01-26 NOTE — Telephone Encounter (Signed)
RN please call patient to see how she is doing after ED visit,    I have been in touch with cardiology who recommend a 30 day holter monitor, echo and referral to cardiology to further evaluate her elevated heart rate - I have placed orders for these studies and a referral to cardiology     Samantha Mayer, PA-C, 01/26/2020

## 2020-01-27 ENCOUNTER — Other Ambulatory Visit: Payer: Self-pay

## 2020-01-27 LAB — EKG

## 2020-01-27 NOTE — Telephone Encounter (Signed)
Reached out to follow up with pt  ED follow up   States her heart has been doing this for quite some time   She states her heart just starts racing   She has cardiac problems in her family   She wore 24 hour heart monitor a few years ago through [place]   She never followed with any results     Pt states she feels fine today   Taking aspirin and metoprolol daily   Denies CP/SOB/palpitations       States cramping is better in her legs   Has follow up appointment with cardiology on 3/26     No other issues or concerns at this time   Pt advised to seek care urgently in emergency room for any symptoms of palpitations/cp or SOB prior to cardiology appointment    Arnette Felts, RN, 01/27/2020

## 2020-01-29 ENCOUNTER — Encounter (HOSPITAL_BASED_OUTPATIENT_CLINIC_OR_DEPARTMENT_OTHER): Payer: Medicare Other

## 2020-01-29 DIAGNOSIS — M7752 Other enthesopathy of left foot: Secondary | ICD-10-CM | POA: Diagnosis not present

## 2020-01-29 DIAGNOSIS — M10072 Idiopathic gout, left ankle and foot: Secondary | ICD-10-CM | POA: Diagnosis not present

## 2020-01-29 DIAGNOSIS — M779 Enthesopathy, unspecified: Secondary | ICD-10-CM | POA: Diagnosis not present

## 2020-01-29 LAB — EKG

## 2020-02-05 ENCOUNTER — Other Ambulatory Visit: Payer: Self-pay

## 2020-02-05 ENCOUNTER — Ambulatory Visit
Admission: RE | Admit: 2020-02-05 | Discharge: 2020-02-05 | Disposition: A | Discharge status: Home / Self Care | Payer: Medicare Other | Attending: Physician Assistant | Admitting: Physician Assistant

## 2020-02-05 DIAGNOSIS — R000 Tachycardia, unspecified: Secondary | ICD-10-CM | POA: Diagnosis present

## 2020-02-05 DIAGNOSIS — R9431 Abnormal electrocardiogram [ECG] [EKG]: Secondary | ICD-10-CM | POA: Diagnosis not present

## 2020-02-09 ENCOUNTER — Other Ambulatory Visit: Payer: Self-pay

## 2020-02-09 ENCOUNTER — Ambulatory Visit: Payer: Medicare Other | Attending: Ophthalmology

## 2020-02-09 ENCOUNTER — Ambulatory Visit (HOSPITAL_BASED_OUTPATIENT_CLINIC_OR_DEPARTMENT_OTHER): Payer: Medicare Other

## 2020-02-09 DIAGNOSIS — H25012 Cortical age-related cataract, left eye: Secondary | ICD-10-CM

## 2020-02-09 NOTE — Progress Notes (Signed)
Pt here for A-scan and topography                   Procedure performed by technician under supervision of attending MD.

## 2020-02-11 ENCOUNTER — Ambulatory Visit: Payer: Medicare Other | Attending: Internal Medicine | Admitting: Internal Medicine

## 2020-02-11 DIAGNOSIS — J4489 Other specified chronic obstructive pulmonary disease: Secondary | ICD-10-CM

## 2020-02-11 DIAGNOSIS — J449 Chronic obstructive pulmonary disease, unspecified: Secondary | ICD-10-CM

## 2020-02-11 DIAGNOSIS — E1129 Type 2 diabetes mellitus with other diabetic kidney complication: Secondary | ICD-10-CM | POA: Diagnosis not present

## 2020-02-11 DIAGNOSIS — Z01818 Encounter for other preprocedural examination: Secondary | ICD-10-CM | POA: Diagnosis present

## 2020-02-11 DIAGNOSIS — H25013 Cortical age-related cataract, bilateral: Secondary | ICD-10-CM

## 2020-02-11 DIAGNOSIS — R000 Tachycardia, unspecified: Secondary | ICD-10-CM

## 2020-02-11 DIAGNOSIS — F17200 Nicotine dependence, unspecified, uncomplicated: Secondary | ICD-10-CM | POA: Diagnosis present

## 2020-02-11 DIAGNOSIS — G4733 Obstructive sleep apnea (adult) (pediatric): Secondary | ICD-10-CM

## 2020-02-11 DIAGNOSIS — R809 Proteinuria, unspecified: Secondary | ICD-10-CM

## 2020-02-11 MED ORDER — UMECLIDINIUM BROMIDE 62.5 MCG/INH IN AEPB
1.0000 | INHALATION_SPRAY | Freq: Every day | RESPIRATORY_TRACT | 0 refills | Status: DC
Start: 2020-02-11 — End: 2020-10-31

## 2020-02-11 MED ORDER — NICOTINE 14 MG/24HR TD PT24
1.00 | MEDICATED_PATCH | TRANSDERMAL | 0 refills | Status: AC
Start: 2020-02-11 — End: 2020-03-24

## 2020-02-11 NOTE — Progress Notes (Signed)
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) ([place])     Tobacco use disorder     Spinal stenosis of lumbar region     HLD (hyperlipidemia)     Chronic bilateral low back pain     Skin change     Pineal gland cyst     History of vitamin D deficiency     Internal hemorrhoids     Venous (peripheral) insufficiency     Adrenal adenoma     Type 2 diabetes mellitus with microalbuminuria, without long-term current use of insulin (HCC)     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 (HCC)     Acute pain of right wrist     Abnormal PFTs     Screening for malignant neoplasm of breast     Skin irritation     H/O bone density study     Vertigo     Orthostatic hypotension     Social problem     Right arm weakness     Insomnia     Acute pain of left knee     Hx of total knee replacement, left     Acute bacterial sinusitis     Seasonal allergic rhinitis     Left foot pain     Stress due to family tension     Leucocytosis     Viral URI     Chronic gastritis     Common bile duct dilation     Fatty liver     Pancreatic lesion     Vascular calcification     Umbilical hernia without obstruction and without gangrene     Arthritis of spine     Scoliosis of thoracic spine     Anxiety     Sleep disturbance     Reflux esophagitis     First degree AV block     Obstructive sleep apnea     Unintentional weight loss     Abdominal pain     Fibroid     Endometrial thickening on ultrasound     ASCUS of cervix with negative high risk HPV     Endometrial polyp     Panic disorder     Chest congestion     Adenomatous polyp of colon     Microalbuminuria     BMI 39.0-39.9,adult     Temporomandibular joint disorder     Sensorineural hearing loss (SNHL) of both ears     Cortical senile cataract, left      S:  Having left eye cataract surgery on 4/21  Left worse than right    Recent office and ED visit for leg cramps, tachycardia, sent to ED  EKG changes,  wearing event monitor for tachycardia, has had in past  Has not had ordered echo yet  Has cardiology visit tomorrow  Wearing event monitor    covid vaccine appt 4/1    COPD   Elevator in building has been broken now fixed  Wants to be off 5th floor  Looking for letter for housing  Albuterol neb 2 times per day    Tobacco - <1/2 ppd  Quit for 8 months in past  Wants patches    HEMOGLOBIN A1C (%)   Date Value   09/29/2019 6.1 (H)   11/05/2018 6.1 (H)   05/14/2018 6.3 (H)     OSA - not using cpap  Needs new equipment  Working with [employer]  [phone]  Has been paying for machine - reports need letter from PCP to get insurance to pay for it  Still has not gotten new supplies    On metoprolol XL, lisinopril  No dizziness  Most Recent BP Reading(s)  01/25/20 : 105/59  01/25/20 : 103/74  08/07/19 : 117/79  01/15/19 : 136/73  11/05/18 : 102/73    O:  Speaking in full sentences    A/P:  (H25.013) Cortical age-related cataract of both eyes  (primary encounter diagnosis)  (Z01.818) Preoperative examination  Comment: discussed  Plan: NOT CLEARED FOR SURGERY - pending cardiology visit tomorrow given below    (E11.29,  R80.9) Type 2 diabetes mellitus with microalbuminuria, without long-term current use of insulin ([place])  Comment: Controlled  Plan: continue current regimen  Discussed low salt diet, exercise    (E66.01) 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 (HCC)  Comment: by prior BMI  Plan: Discussed portion control, healthy food choice, exercise    (R00.0) Tachycardia  Comment: recent worsening, has event monitor on, cardiology appt tomorrow, echo not yet scheduled  Plan: Follow-up with cardiology to discuss - ask them re clearance    (G47.33) Obstructive sleep apnea  Comment: not wearing cpap - reports issues needing new equipment as above  Plan: encouraged to call the company to see what is needed - if something from our office needed - given them our fax to send request    (J44.9) Chronic bronchitis  with COPD (chronic obstructive pulmonary disease) ([place])  Comment: not well controlled  Plan: Add incruse inhaler  Continue albuterol prn  Stop smoking as below    (F17.200) Tobacco use disorder  Comment: ready for quit attempt  Plan: patches given

## 2020-02-11 NOTE — Progress Notes (Signed)
CARDIOLOGY CLINIC CONSULT NOTE - TELE visit 2/2 covid-19 pandemic  Date of visit: 02/12/2020  Language: English    History of present illness:   64 year old female with COPD, DM 2, tobacco use, OSA on cpap, overweight/obesity    pcp note says prior  eval with Holter 2019- none seen. hx tachy    Saw PCP 01/25/2020 for leg pain. Had been happening all weekend. Tried flexeril.  Found to be tachycardic and sent to the ER.   In ER occasional runs of tachycardia, sometimes T wave being over counted as a QRS complex.  No DVT on ultrasound.  Did describe feelings of heart racing.  Taking Toprol 25 daily. Per ER note "Spoke with cardiology, they thought the rate was closer to 100 and machine was double counting, thought that RBBB was rate related widening and not anything acute. ".  And ordered event monitor echo and cards follow-up    Televisit 02/12/2020  -sxs of heart racing x many years  No syncope  Recently elevator broken in building. Went out 6 times. Had sob and heart racing at top of stairs - lives on 5th floor. Seems sxs w/in expected range given usually takes elevator  Baseline activity - regular daily things - going to store etc  Says has had sxs of heart racing for so long hard for her to notice sxs anymore - only has racing, not other palps like skips, pauses  No edema, bleeding, cva sxs   Has knee problems, spinal stenosis  No pnd  Goes to back doctor monthly  lyrica - sleepy w bid, take prn  Has nebs - hasn't used recently  Metoprolol long-term 25 mg from 3 years episode  No BP machine at home  Cataract surgery currently scheduled for 4/21  Has symptoms of vertigo when she climbs ladders things like that so she avoids it.  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) ([place])     Tobacco use disorder     Spinal stenosis of lumbar region     HLD (hyperlipidemia)     Chronic bilateral low back pain     Skin change     Pineal gland cyst     History of vitamin D deficiency     Internal hemorrhoids     Venous (peripheral) insufficiency     Adrenal adenoma     Type 2 diabetes mellitus with microalbuminuria, without long-term current use of insulin (HCC)     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 (HCC)     Acute pain of right wrist     Abnormal PFTs     Screening for malignant neoplasm of breast     Skin irritation     H/O bone density study     Vertigo     Orthostatic hypotension     Social problem     Right arm weakness     Insomnia     Acute pain  of left knee     Hx of total knee replacement, left     Acute bacterial sinusitis     Seasonal allergic rhinitis     Left foot pain     Stress due to family tension     Leucocytosis     Viral URI     Chronic gastritis     Common bile duct dilation     Fatty liver     Pancreatic lesion     Vascular calcification     Umbilical hernia without obstruction and without gangrene     Arthritis of spine     Scoliosis of thoracic spine     Anxiety     Sleep disturbance     Reflux esophagitis     First degree AV block     Obstructive sleep apnea     Unintentional weight loss     Abdominal pain     Fibroid     Endometrial thickening on ultrasound     ASCUS of cervix with negative high risk HPV     Endometrial polyp     Panic disorder     Chest congestion     Adenomatous polyp of colon     Microalbuminuria     BMI 39.0-39.9,adult     Temporomandibular joint disorder     Sensorineural hearing loss (SNHL) of both ears     Cortical senile cataract, left      Allergies:  Review of Patient's Allergies indicates:   Codeine camsylate       Shortness of Breath, Other  (See                            Comments)    Comment:Chest pain   Motrin [ibuprofen]      Nausea Only   Sulfa antibiotics       Other (See Comments)   Bactrim                 Rash, Itching    Meds: not taking fluoxetine.   Current Outpatient Medications   Medication Instructions    albuterol (PROVENTIL) 2.5 mg, Nebulization, EVERY 6 HOURS PRN, Dx: Code: J44.9    aspirin 81 mg, DAILY    atorvastatin (LIPITOR) 20 MG tablet take 1 tablet by mouth once daily    cholecalciferol (VITAMIN D3) 1000 UNIT tablet 1 tablet, Oral, DAILY    FLUoxetine (PROZAC) 20 mg, Oral, DAILY    lisinopril (ZESTRIL) 20 mg, Oral, DAILY    metFORMIN (GLUCOPHAGE-XR) 500 MG 24 hr tablet take 4 tablets by mouth once daily with BREAKFAST    metoprolol (TOPROL-XL) 25 mg, Oral, DAILY    Misc. Devices (BATH/SHOWER SEAT) MISC Device 1 Device, Other, DAILY    nicotine (NICODERM CQ) 14 MG/24HR 1 patch, Transdermal, EVERY 24 HOURS    Omega-3 Fatty Acids (FISH OIL) 1000 MG CAPS Oral    oxycodone-acetaminophen (PERCOCET) 5-325 MG per tablet 1 tablet, Oral, 2 TIMES DAILY PRN, With Dr Dwyane Dee    pregabalin (LYRICA) 100 mg, Oral, PRN, With Dr Dwyane Dee    umeclidinium (INCRUSE ELLIPTA) 62.5 MCG/INH inhaler 1 puff, Inhalation, DAILY      Social History: tob <1/2 ppd. Quit 3 yrs ago for 8 months. Caffeine none. etoh - none. No heroin/cocaine. 87 yo granddaughter lives with her.     Family History: brother - cabg in 49s. M - HF died at 7. F died Mar 08, 2014.  ROS: All other systems reviewed were pertinently negative apart from the history of present illness.    PHYSICAL EXAM:    DATA:  LABS:   Lab Results   Component Value Date    NA 141 01/25/2020    K 4.8 01/25/2020    CL 104 01/25/2020    CO2 28 01/25/2020    BUN 18 01/25/2020    CREAT 0.8 01/25/2020    GLUCOSER 95 01/25/2020    LDL 70 11/05/2018    HDL 46 11/05/2018    TG 229 (H) 11/05/2018    ALT 32 07/30/2018    AST 21 07/30/2018     NT-proBNP (pg/mL)   Date Value   05/09/2018 114   04/18/2017 102      EKG: (on my personal review) 01/25/2020-17:22:25-sinus tach 101 bpm.  Some beats with aberrant right bundle branch block conduction.  Cannot exclude IMI.  3 EKGs from same day all similar.  On prior EKGs IMI pattern is intermittent suggesting its lead placement related.     Stress echo 4/13 -modified [facility].  6 minutes 9 seconds.  Heart rate 92% predicted.  Note ischemic changes.  Echo 04/29/2018  1. EF 65%.  2. no regional wall motion abnormalities.  3.RVSP is estimated at 16 mmHg which is normal.  4. No valvular abnormalities.    Holter 04/29/2018-heart rate 55-160, average 97.  8 PVCs.  170 PACs.  No runs.  Intermittent first-degree AV block during hours of sleep    Event monitor 01/2020-available strips reviewed.  02/06/2020-average HR 103.  Sinus occasional PVCs  02/07/2020 average HR 93  02/08/2020-average heart rate 94.  During symptoms of S OB heart rate seen had sinus tach 159 with PACs, probable PAC with aberrant conduction  02/09/2020-average HR 93. Symptoms of S OB heart racing sinus tach 147 with occasional wide-complex beat probably PAC with right bundle  02/10/2020-average HR 94 sinus tach with PACs   --- Strips that report PVCs given only 2 channels more likely to be PACs with right bundle aberrancy than actual PVCs given EKGs    Assessment and Plan:  Tachycardia, PACS with aberrant right bundle conduction  -Longstanding symptoms of heart racing generally well controlled on low-dose metoprolol.  Given degree of sinus tachycardia will increase metoprolol to 50 mg.  Given blood pressure runs low we will also decrease lisinopril to 10 mg.  Given new scripts.  Will continue to wear cardiac event monitor.  May need to also do Holter to better understand burden of PACs.  Most if not all things labeled PVCs are actually PACs with right bundle aberrancy.  No dangerous arrhythmia seen.  No runs.  Stop omega-3 -recent data suggests omega-3 supplementation increased risks of A. fib  TSH normal 11/20    HTN  -Increase  metoprolol to 50 mg from 25 so we will decrease lisinopril from 20 to 10 mg given recorded BPs    OSA  -CPAP titration 12/01/2018    DM2  -Controlled per patient.  Per PCP    Overweight/obesity  -gained a few lbs during covid    Cataract surgery  -I do not anticipate concerns with cataract surgery however am changing meds and will review monitor results.  planning for in person appointment 02/19/20.  Anticipate she will be able to have surgery as scheduled April 21      Follow up with Cardiology in 1 week in person. To contact earlier if there are any cardiac related issues.  Thank you for the privilege of involving me in this patient's care.  Please feel free to contact me if you have any questions.  This patient encounter note was created using voice-recognition software and in real time. Please excuse any typographical errors that have not been edited out.     Electronically signed by: Valli Glance, MD

## 2020-02-12 ENCOUNTER — Ambulatory Visit (HOSPITAL_BASED_OUTPATIENT_CLINIC_OR_DEPARTMENT_OTHER): Payer: Self-pay | Admitting: Ambulatory Care

## 2020-02-12 ENCOUNTER — Ambulatory Visit: Payer: Medicare Other | Attending: Cardiovascular Disease | Admitting: Cardiovascular Disease

## 2020-02-12 DIAGNOSIS — R000 Tachycardia, unspecified: Secondary | ICD-10-CM | POA: Insufficient documentation

## 2020-02-12 DIAGNOSIS — J309 Allergic rhinitis, unspecified: Secondary | ICD-10-CM | POA: Diagnosis not present

## 2020-02-12 DIAGNOSIS — H6091 Unspecified otitis externa, right ear: Secondary | ICD-10-CM | POA: Diagnosis not present

## 2020-02-12 DIAGNOSIS — Z20822 Contact with and (suspected) exposure to covid-19: Secondary | ICD-10-CM | POA: Diagnosis not present

## 2020-02-12 DIAGNOSIS — G43909 Migraine, unspecified, not intractable, without status migrainosus: Secondary | ICD-10-CM | POA: Diagnosis not present

## 2020-02-12 MED ORDER — LISINOPRIL 10 MG PO TABS
10.0000 mg | ORAL_TABLET | Freq: Every day | ORAL | 1 refills | Status: DC
Start: 2020-02-12 — End: 2020-04-07

## 2020-02-12 MED ORDER — METOPROLOL SUCCINATE ER 50 MG PO TB24
50.0000 mg | ORAL_TABLET | Freq: Every day | ORAL | 1 refills | Status: DC
Start: 2020-02-12 — End: 2020-04-08

## 2020-02-12 NOTE — Telephone Encounter (Signed)
Samantha Cameron, pt. Had a televisit yesterday. Last night she experienced bad ear pains on the right side. Please reach out at [phone]         Reached out to pt   States after her televisit with Dr. [name]   She began to have right ear pain   Denies fever    States she stuck a tissue in her ear and now it is killing her   States it feels blocked   States her ear feels hot     Pt offered [facility] appointment in order for provider to look in her ear in person   Pt declines stating it is too far    States she will go to [facility] today     Arnette Felts, RN, 02/12/2020

## 2020-02-17 ENCOUNTER — Ambulatory Visit (HOSPITAL_BASED_OUTPATIENT_CLINIC_OR_DEPARTMENT_OTHER): Payer: Medicare Other

## 2020-02-17 ENCOUNTER — Encounter (HOSPITAL_BASED_OUTPATIENT_CLINIC_OR_DEPARTMENT_OTHER): Payer: Self-pay

## 2020-02-17 DIAGNOSIS — M5417 Radiculopathy, lumbosacral region: Secondary | ICD-10-CM | POA: Diagnosis not present

## 2020-02-17 DIAGNOSIS — M47816 Spondylosis without myelopathy or radiculopathy, lumbar region: Secondary | ICD-10-CM | POA: Diagnosis not present

## 2020-02-17 NOTE — Progress Notes (Signed)
PSYCHIATRY TERMINATION AND TRANSFER NOTE    Termination Document    Date treatment started: 07/14/2018    Transfer/termination date: 12/30/2018    Reason for treatment: insomnia, depression, anxiety, panic d/o, physical health issues (GERD, acid reflux), frequent visits at ED, grief     Treatment course (response to medications, compliance): Pt met with this writer 4 times out of 10 scheduled visits. Pt explored her hx of loss w/death of son, brother, and father within 64 mos of each other several years ago. Pt worked on Doctor, general practice for anxiety to reduce overall anxiety and learn to respond effectively to escalating panic (without presenting at ED). Pt declined to continue tx once pandemic started and preferred instead to be seen in-person for visits, which wasn't an option.     Outstanding Issues: all above issues are outstanding    Safe to refill: n/a    Risk level: low    Plan: psychopharm w/ Dr. Delynn Flavin; consider returning to therapy in future should sessions be able to be held in-person    For transfers, the treatment plan will now be the responsibility of: Dr. Patsey Berthold    (Clinician, please route/cc this encounter to your team/program administrative coordinator so that the treatment plan database can be updated.)

## 2020-02-17 NOTE — Progress Notes (Signed)
CARDIOLOGY CLINIC CONSULT NOTE -  Date of visit: 02/19/2020  Language: English    History of present illness:   64 year old female with COPD, DM 2, tobacco use, OSA on cpap, overweight/obesity    pcp note says prior  eval with Holter 2019- none seen. hx tachy    Saw PCP 01/25/2020 for leg pain. Had been happening all weekend. Tried flexeril.  Found to be tachycardic and sent to the ER.   In ER occasional runs of tachycardia, sometimes T wave being over counted as a QRS complex.  No DVT on ultrasound.  Did describe feelings of heart racing.  Taking Toprol 25 daily. Per ER note "Spoke with cardiology, they thought the rate was closer to 100 and machine was double counting, thought that RBBB was rate related widening and not anything acute. ".  And ordered event monitor echo and cards follow-up    Televisit 02/12/2020  -sxs of heart racing x many years, No syncope  Recently elevator broken in building. Went out 6 times. Had sob and heart racing at top of stairs - lives on 5th floor. Seems sxs w/in expected range given usually takes elevator  Has nebs - hasn't used recently  Metoprolol long-term 25 mg  No BP machine at home  Has symptoms of vertigo when she climbs ladders things like that so she avoids it.  Has previously been prescribed meclizine  --------------Increase metoprolol to 50 mg from 25, decrease lisinopril from 20 to 10. Stop omega-3    Office 02/19/20  Does not notice a significant difference with med changes.  Says blood pressure was fine yesterday at appointment for her back.  Continues to say has DOE and heart racing with more than baseline exertion such as walking from the elevator down to clinic today.  Seems to recover fairly quickly.  No new or different symptoms.  No CP, edema, syncope, PND  ---------metoprolol to 75 mg in one wk    Most Recent BP Reading(s)  02/19/20 : 101/66  01/25/20 : 105/59  01/25/20 : 103/74  08/07/19 : 117/79  01/15/19 : 136/73    Most Recent Weight Reading(s)  02/19/20 : 100.7  kg (222 lb)  01/25/20 : 100.2 kg (221 lb)  08/07/19 : 97.1 kg (214 lb)  01/15/19 : 97.1 kg (214 lb)  11/05/18 : 97.5 kg (215 lb)    Past Medical History:  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) ([place])     Tobacco use disorder     Spinal stenosis of lumbar region     HLD (hyperlipidemia)     Chronic bilateral low back pain     Skin change     Pineal gland cyst     History of vitamin D deficiency     Internal hemorrhoids     Venous (peripheral) insufficiency     Adrenal adenoma     Type 2 diabetes mellitus with microalbuminuria, without long-term current use of insulin (HCC)     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 (HCC)     Acute pain of right wrist     Abnormal PFTs     Screening for malignant neoplasm of breast     Skin irritation     H/O bone density study     Vertigo     Orthostatic hypotension     Social problem     Right arm weakness     Insomnia  Acute pain of left knee     Hx of total knee replacement, left     Acute bacterial sinusitis     Seasonal allergic rhinitis     Left foot pain     Stress due to family tension     Leucocytosis     Viral URI     Chronic gastritis     Common bile duct dilation     Fatty liver     Pancreatic lesion     Vascular calcification     Umbilical hernia without obstruction and without gangrene     Arthritis of spine     Scoliosis of thoracic spine     Anxiety     Sleep disturbance     Reflux esophagitis     First degree AV block     Obstructive sleep apnea     Unintentional weight loss     Abdominal pain     Fibroid     Endometrial thickening on ultrasound     ASCUS of cervix with negative high risk HPV     Endometrial polyp     Panic disorder     Chest congestion     Adenomatous polyp of colon     Microalbuminuria     BMI 39.0-39.9,adult     Temporomandibular joint disorder     Sensorineural hearing loss (SNHL) of both ears     Cortical senile cataract, left      Allergies:  Review of Patient's  Allergies indicates:   Codeine camsylate       Shortness of Breath, Other (See                            Comments)    Comment:Chest pain   Motrin [ibuprofen]      Nausea Only   Sulfa antibiotics       Other (See Comments)   Bactrim                 Rash, Itching    Meds: not taking fluoxetine.   Current Outpatient Medications   Medication Instructions    albuterol (PROVENTIL) 2.5 mg, Nebulization, EVERY 6 HOURS PRN, Dx: Code: J44.9    aspirin 81 mg, DAILY    atorvastatin (LIPITOR) 20 MG tablet take 1 tablet by mouth once daily    cholecalciferol (VITAMIN D3) 1000 UNIT tablet 1 tablet, Oral, DAILY    FLUoxetine (PROZAC) 20 mg, Oral, DAILY    lisinopril (ZESTRIL) 10 mg, Oral, DAILY    metFORMIN (GLUCOPHAGE-XR) 500 MG 24 hr tablet take 4 tablets by mouth once daily with BREAKFAST    metoprolol (TOPROL-XL) 50 mg, Oral, DAILY    Misc. Devices (BATH/SHOWER SEAT) MISC Device 1 Device, Other, DAILY    nicotine (NICODERM CQ) 14 MG/24HR 1 patch, Transdermal, EVERY 24 HOURS    oxycodone-acetaminophen (PERCOCET) 5-325 MG per tablet 1 tablet, Oral, 2 TIMES DAILY PRN, With Dr Dwyane Dee    pregabalin (LYRICA) 100 mg, Oral, PRN, With Dr Dwyane Dee    umeclidinium (INCRUSE ELLIPTA) 62.5 MCG/INH inhaler 1 puff, Inhalation, DAILY      Social History: tob <1/2 ppd. Quit 3 yrs ago for 8 months. Caffeine none. etoh - none. No heroin/cocaine. 26 yo granddaughter lives with her.     Family History: brother - cabg in 11s. M - HF died at 79. F died 2014/02/15.     ROS: All other systems reviewed were pertinently negative apart from  the history of present illness.    PHYSICAL EXAM:   02/19/20  0851   BP: 101/66   Site: Left Arm   Position: Sitting   Cuff Size: Regular   Pulse: 85   Resp: 20   Temp: 97.2 F (36.2 C)   TempSrc: Temporal   SpO2: 97%   Weight: 100.7 kg (222 lb)     Body mass index is 40.6 kg/m.   110/60 85  116/70 md repeat  General: NAD, well developed, well nourished.   HEENT: Oral mucosa is moist, no icterus, no pallor  noted.  NECK: No jugular venous distention, no carotid bruits, 2+ carotid upstrokes.  CHEST: Clear to auscultation, no crackles or rhonchi. Nontender.  HEART:  No heaves or lifts. Regular rate and rhythm. No rubs. No murmur.  ABDOMEN: NABS. Soft. Nontender and nondistended.  EXTREMITIES: Warm and well perfused. No edema. 2+ pulses.  NEURO: A&O X 3, moves all extremities. Grossly nonfocal.  PSYCHIATRIC: Mood and affect are appropriate.      DATA:  LABS:   Lab Results   Component Value Date    NA 141 01/25/2020    K 4.8 01/25/2020    CL 104 01/25/2020    CO2 28 01/25/2020    BUN 18 01/25/2020    CREAT 0.8 01/25/2020    GLUCOSER 95 01/25/2020    LDL 70 11/05/2018    HDL 46 11/05/2018    TG 229 (H) 11/05/2018    ALT 32 07/30/2018    AST 21 07/30/2018     NT-proBNP (pg/mL)   Date Value   05/09/2018 114   04/18/2017 102     EKG: (on my personal review) 01/25/2020-17:22:25-sinus tach 101 bpm.  Some beats w aberrant right bundle branch block conduction.  Cannot exclude IMI.  3 EKGs from same day all similar.  On prior EKGs IMI pattern is intermittent suggesting its lead placement related.     Stress echo 4/13 -modified Bruce. 6:09.  Heart rate 92% predicted.  No ischemic changes.  Echo 04/29/2018  1. EF 65%.  2. no rwma  3.RVSP 16    4. No valvular abnormalities    Holter 04/29/2018-heart rate 55-160, average 97.  8 PVCs.  170 PACs.  No runs.  Intermittent first-degree AV block during hours of sleep    Event monitor 01/2020-  02/06/2020 through 02/10/2020 -sinus and sinus tach with isolated PACs aberrancy versus PVC  3/25 through 4/1 reviewed -tracings similar to prior with sinus rhythm with PAC with aberrancy versus PVC.  On 02/15/2020 there is a 4 beat run of NSVT at a fast rate to 18 bpm.    Assessment and Plan:  Tachycardia, PACS with aberrant right bundle conduction  -Longstanding symptoms of heart racing generally well controlled on low-dose metoprolol.  sxs stable. Hr ok. Given one run nsvt on monitor and cont to have PACs w  abberancy (less likely pvcs) will increase metoprolol further and plan for lexiscan stress (pt wouldn't be able to do treadmill).    Will continue to wear cardiac event monitor.   TSH normal 11/20  Echo 4/13    HTN  -Increase metoprolol to 75. She wants to wait another wk to make sure bp ok. May need to decrease lisinopril further. Try not to stop lisinopril given does have proteinuria from DM although cr normal.      OSA  -CPAP titration 12/01/2018. Doesn't wear cpap currently  Needs new tubing  Last wore a few months   Works  when she has proper equipment    DM2  -Controlled per patient.  Per PCP. aic 6.0 11/20 on metformin    Lipids  -LDL 70 in 12/19    Overweight/obesity  -gained a few lbs during covid    tob sue  -pt aware cessation is goal    Cataract surgery  -I do not anticipate concerns with cataract surgery however am changing meds and will review monitor results. Has echo on 4/13.  Anticipate she will be able to have surgery as scheduled April 21      Follow up with Cardiology in ~71months. To contact earlier if there are any cardiac related issues.      Thank you for the privilege of involving me in this patient's care.  Please feel free to contact me if you have any questions.  This patient encounter note was created using voice-recognition software and in real time. Please excuse any typographical errors that have not been edited out.     Electronically signed by: Valli Glance, MD

## 2020-02-18 ENCOUNTER — Other Ambulatory Visit: Payer: Self-pay

## 2020-02-18 ENCOUNTER — Ambulatory Visit: Payer: Medicare Other | Attending: Internal Medicine

## 2020-02-18 DIAGNOSIS — Z23 Encounter for immunization: Secondary | ICD-10-CM | POA: Insufficient documentation

## 2020-02-19 ENCOUNTER — Ambulatory Visit: Payer: Medicare Other | Attending: Cardiovascular Disease | Admitting: Cardiovascular Disease

## 2020-02-19 VITALS — BP 116/70 | HR 85 | Temp 97.2°F | Resp 20 | Wt 222.0 lb

## 2020-02-19 DIAGNOSIS — G4733 Obstructive sleep apnea (adult) (pediatric): Secondary | ICD-10-CM | POA: Diagnosis present

## 2020-02-19 DIAGNOSIS — Z720 Tobacco use: Secondary | ICD-10-CM | POA: Diagnosis present

## 2020-02-19 DIAGNOSIS — R0600 Dyspnea, unspecified: Secondary | ICD-10-CM | POA: Diagnosis not present

## 2020-02-19 DIAGNOSIS — I472 Ventricular tachycardia: Secondary | ICD-10-CM | POA: Diagnosis present

## 2020-02-19 DIAGNOSIS — I4729 Other ventricular tachycardia: Secondary | ICD-10-CM

## 2020-02-19 DIAGNOSIS — I491 Atrial premature depolarization: Secondary | ICD-10-CM | POA: Diagnosis present

## 2020-02-19 DIAGNOSIS — R0609 Other forms of dyspnea: Secondary | ICD-10-CM

## 2020-02-19 NOTE — Patient Instructions (Signed)
In one week increase metoprolol to 75 mg  - you can take 1 and 1/2 of the 50 mg tabs. In the future we may try 100 mg

## 2020-02-19 NOTE — Progress Notes (Signed)
Pt recived to room 28. No recent symptoms of illness. VSS.

## 2020-02-29 ENCOUNTER — Emergency Department (HOSPITAL_BASED_OUTPATIENT_CLINIC_OR_DEPARTMENT_OTHER): Payer: Medicare Other

## 2020-02-29 ENCOUNTER — Emergency Department
Admission: EM | Admit: 2020-02-29 | Discharge: 2020-02-29 | Disposition: A | Discharge status: Home / Self Care | Payer: Medicare Other | Source: Intra-hospital | Attending: Physician Assistant | Admitting: Physician Assistant

## 2020-02-29 ENCOUNTER — Other Ambulatory Visit: Payer: Self-pay

## 2020-02-29 DIAGNOSIS — Z87891 Personal history of nicotine dependence: Secondary | ICD-10-CM

## 2020-02-29 DIAGNOSIS — M25572 Pain in left ankle and joints of left foot: Secondary | ICD-10-CM | POA: Diagnosis present

## 2020-02-29 DIAGNOSIS — M79672 Pain in left foot: Secondary | ICD-10-CM | POA: Diagnosis present

## 2020-02-29 DIAGNOSIS — M7732 Calcaneal spur, left foot: Secondary | ICD-10-CM | POA: Diagnosis not present

## 2020-02-29 DIAGNOSIS — M7918 Myalgia, other site: Secondary | ICD-10-CM | POA: Diagnosis not present

## 2020-02-29 DIAGNOSIS — W109XXA Fall (on) (from) unspecified stairs and steps, initial encounter: Secondary | ICD-10-CM | POA: Diagnosis not present

## 2020-02-29 NOTE — Discharge Instructions (Addendum)
You are seen in the emergency department today for evaluation of foot pain.  Your x-rays do not show any fractures.  You likely sprained your foot.  You can use the hard soled shoe for comfort at home.  Keep your leg elevated and apply ice to the area for 10 to 15 minutes 3 times a day.  You can take 400 to 600 mg ibuprofen every 8 hours as needed for pain at home.  Call your doctor to arrange a follow-up appointment.  Return to the emergency department if you have worsening pain, complete numbness or weakness in your foot, or any other concerns.

## 2020-02-29 NOTE — ED Provider Notes (Signed)
ED nursing record was reviewed. Prior records as available electronically through the Epic record were reviewed.      HPI:    This 64 year old female presents to the Emergency Department with chief complaint of left foot pain.  Patient states she fell down approximately 3 stairs 2 days ago after her leg gave out.  Denies head strike or LOC.  States she has had pain to the top of the foot and in the lateral left ankle since falling.  Feels as though her ankle is swollen.  No paresthesias.  The pain is worse with movement or walking.  No prior injuries.  The pain does not radiate to the knee or hip.  No analgesics prior to arrival.      ROS: Pertinent positives/negatives were reviewed as per the HPI above.    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) ([place])  09/14/2015: Cough  No date: Depression      Comment:  per steward records  No date: Diabetes  No date: Disorders of lipoid metabolism  05/23/2018: Epigastric pain      Comment:  05/2018 GI: Assessment: Pt is 33F with PMH COPD, morbid                obesity, H.pylori (2009, unclear if treated) who p/w                severe GERD symptoms, unintentional weight loss (~20 lbs                since 03/2018) and abdominal pain.  1. GERD - severe 2.                Unintentional weight loss 3. Abdominal pain    Plan: 1.                Recommend EGD. This procedure has been fully reviewed                with the patient and written informed consent has been                obtained. 2.   No date: Esophageal reflux  No date: External hemorrhoid      Comment:  per steward records  04/19/2017: Gastritis without bleeding      Comment:  GI endoscopy: 06/2018: Mildly severe reflux esophagitis.               Biopsied.      - Chronic gastritis. Biopsied.      - No  gross lesions in the entire examined duodenum.                Complications:  No immediate complications.                Recommendation:      - Await pathology results. 7/2019GI:               Assessment: Pt is 17F with PMH COPD, morbid obesity,                H.pylori (2009, unclear if treated) who p/w severe GERD                symptoms, unintentional weigh  05/10/2018: Gastroesophageal reflux disease without esophagitis  No date: HTN (hypertension)  02/01/2012: Hypoxia  02/16/2017: Left ear pain  06/07/2015: Left genital labial abscess  06/16/2015: Obesity, Class III, 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y) ([place])  08/16/2015: Osteopenia      Comment:  On xray of left knee per steward records 06/2014 Unclear                if addressed  05/23/2018: Pancreatic lesion  09/14/2015: Right ear impacted cerumen  No date: Sleep apnea  08/15/2015: Type 2 diabetes mellitus without complication, without   long-term current use of insulin (HCC)      Comment:   HEMOGLOBIN A1C (%) Date Value 07/15/2015 6.2 (H)                ----------  Steward record - a1c 6.2 on 01/31/15 6.4 on                09/30/14 5.9 on 12/29/13 6.4 08/18/13   11/18/2017: Viral URI with cough  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) ([place])     Tobacco use disorder     Spinal stenosis of lumbar region     HLD (hyperlipidemia)     Chronic bilateral low back pain     Skin change     Pineal gland cyst     History of vitamin D deficiency     Internal hemorrhoids      Venous (peripheral) insufficiency     Adrenal adenoma     Type 2 diabetes mellitus with microalbuminuria, without long-term current use of insulin (HCC)     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 (HCC)     Acute pain of right wrist     Abnormal PFTs     Screening for malignant neoplasm of breast     Skin irritation     H/O bone density study     Vertigo     Orthostatic hypotension     Social problem     Right arm weakness     Insomnia     Acute pain of left knee     Hx of total knee replacement, left     Acute bacterial sinusitis     Seasonal allergic rhinitis     Left foot pain     Stress due to family tension     Leucocytosis     Viral URI     Chronic gastritis     Common bile duct dilation     Fatty liver     Pancreatic lesion     Vascular calcification     Umbilical hernia without obstruction and without gangrene     Arthritis of spine     Scoliosis of thoracic spine  Anxiety     Sleep disturbance     Reflux esophagitis     First degree AV block     Obstructive sleep apnea     Unintentional weight loss     Abdominal pain     Fibroid     Endometrial thickening on ultrasound     ASCUS of cervix with negative high risk HPV     Endometrial polyp     Panic disorder     Chest congestion     Adenomatous polyp of colon     Microalbuminuria     BMI 39.0-39.9,adult     Temporomandibular joint disorder     Sensorineural hearing loss (SNHL) of both ears     Cortical senile cataract, left        Past Surgical History: Past Surgical History:  No date: ANES NERVE MUSC TENDON FASCIA&BURSA KNEE&/POPLT  No date: FOOT SURGERY      Comment:  bilateral  No date: LAPAROSCOPY SURG CHOLECYSTECTOMY  No date: OB ANTEPARTUM CARE CESAREAN DLVR & POSTPARTUM      Comment:  x3  No date: TONSILLECTOMY & ADENOIDECTOMY <AGE 5  04/13/2009: TOTAL KNEE REPLACEMENT      Comment:  left  08/11/2009: WRIST GANGLION EXCISION      Comment:  left       Medications: No current facility-administered medications on file prior to  encounter.  metoprolol (TOPROL-XL) 50 MG 24 hr tablet, Take 1 tablet by mouth daily, Disp: 90 tablet, Rfl: 1  lisinopril (ZESTRIL) 10 MG tablet, Take 1 tablet by mouth daily, Disp: 90 tablet, Rfl: 1  umeclidinium (INCRUSE ELLIPTA) 62.5 MCG/INH inhaler, Inhale 1 puff into the lungs daily, Disp: 1 Inhaler, Rfl: 0  nicotine (NICODERM CQ) 14 MG/24HR, Place 1 patch onto the skin daily, Disp: 42 patch, Rfl: 0  metFORMIN (GLUCOPHAGE-XR) 500 MG 24 hr tablet, take 4 tablets by mouth once daily with BREAKFAST, Disp: 360 tablet, Rfl: 3  atorvastatin (LIPITOR) 20 MG tablet, take 1 tablet by mouth once daily, Disp: 90 tablet, Rfl: 3  Misc. Devices (BATH/SHOWER SEAT) MISC Device, 1 Device by Other route daily, Disp: 1 each, Rfl: 0  albuterol (PROVENTIL) (2.5 MG/3ML) 0.083% nebulizer solution, Take 3 mLs by nebulization every 6 (six) hours as needed for Wheezing Dx: Code: J44.9, Disp: 75 mL, Rfl: 1  FLUoxetine (PROZAC) 20 MG capsule, Take 1 capsule by mouth daily, Disp: 30 capsule, Rfl: 2  cholecalciferol (VITAMIN D3) 1000 UNIT tablet, Take 1 tablet by mouth daily, Disp: , Rfl:   pregabalin (LYRICA) 100 MG capsule, Take 100 mg by mouth as needed With Dr Dwyane Dee     , Disp: , Rfl:   oxycodone-acetaminophen (PERCOCET) 5-325 MG per tablet, Take 1 tablet by mouth 2 (two) times daily as needed With Dr Dwyane Dee, Disp: , Rfl:   aspirin 81 MG tablet, Take 81 mg by mouth daily., Disp: , Rfl:           Social History:   Social History     Socioeconomic History    Marital status: Divorced     Spouse name: Not on file    Number of children: Not on file    Years of education: Not on file    Highest education level: Not on file   Occupational History    Not on file   Tobacco Use    Smoking status: Former Smoker     Packs/day: 0.50     Years: 31.00     Pack years:  15.50     Types: Cigarettes     Quit date: 03/10/2018     Years since quitting: 1.9    Smokeless tobacco: Never Used    Tobacco comment: quit smoking inform provided to patient    Substance and Sexual Activity    Alcohol use: No    Drug use: No    Sexual activity: Not Currently     Partners: Male     Birth control/protection: Tubal Ligation   Other Topics Concern    Not on file   Social History Narrative    Lives with son    1 daughter and another son passed away    Disabled from back    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 Simons,MD, 07/15/2015, 2:56 PM        Social Determinants of Health  Financial Resource Strain:     Difficulty of Paying Living Expenses:   Food Insecurity:     Worried About Charity fundraiser in the Last Year:     Arboriculturist in the Last Year:   Transportation Needs:     Film/video editor (Medical):     Lack of Transportation (Non-Medical):   Physical Activity:     Days of Exercise per Week:     Minutes of Exercise per Session:   Stress:     Feeling of Stress :   Social Connections:     Frequency of Communication with Friends and Family:     Frequency of Social Gatherings with Friends and Family:     Attends Religious Services:     Active Member of Clubs or Organizations:     Attends Archivist Meetings:     Marital Status:   Intimate Partner Violence:     Fear of Current or Ex-Partner:     Emotionally Abused:     Physically Abused:     Sexually Abused:         Allergies:  Review of Patient's Allergies indicates:   Codeine camsylate       Shortness of Breath, Other (See                            Comments)    Comment:Chest pain   Motrin [ibuprofen]      Nausea Only   Sulfa antibiotics       Other (See Comments)   Bactrim                 Rash, Itching      Physical Exam:  BP 109/60    Pulse 70    Temp 97 F    Resp 19    LMP 10/22/2007 (LMP Unknown)    SpO2 97%     GENERAL: Well appearing, No acute  distress, non-toxic.   SKIN:  Pink, warm, and dry.  HEAD:  Normocephalic, atraumatic.   LUNGS: Nonlabored respirations  EXTREMITIES:  No obvious deformities.  No swelling, ecchymosis, erythema, wounds to the left lower extremity.  Mild tenderness to the left lateral malleolus and dorsum of the left foot.  No tenderness to the left knee or  fibular head.  Full range of motion of the left lower extremity.  2+ DP pulses.  NEUROLOGIC:  Alert and oriented x4; moves all extremities well; speaking in clear fluent sentences.   PSYCHIATRIC:  Appropriate for age, time of day, and situation      RESULTS  No results found for this visit on 02/29/20 (from the past 24 hour(s)).     RADIOLOGY  Impression:     1. Foot: No fracture   2. Ankle: No fracture      EKG:      PROCEDURES      MEDICATIONS ADMINISTERED ON THIS VISIT  No orders of the defined types were placed in this encounter.         ED Course and Medical Decision-making:  This 64 year old female patient presents for evaluation of left foot and ankle pain after fall down 3 stairs 2 days ago.  She is well-appearing and vitals are within limits.  She has minimal tenderness to the left lateral malleolus and dorsum of the left foot.  She is neurovascularly intact with full range of motion.  No evidence of infection or ischemia.    X-rays do not show any fractures of the ankle or foot on my read and on radiology review.  Likely a mild sprain.  She was provided with a postoperative shoe for comfort.  Recommend RICE, NSAIDs, and close PCP follow-up.    Patient was instructed to follow-up with PCP in 7days for re-evaluation.  Signs and symptoms for which to return to the Emergency Department were discussed with the patient in detail, and they understand and are in agreement with their care plan at this time.    The patient remained hemodynamically stable throughout entire visit.    Condition: stable  Disposition: home      Diagnosis/Diagnoses:  Left foot pain    Cyril Loosen,  PA-C  [facility] [place]    This Emergency Department patient encounter note was created using voice-recognition software and in real time during the ED visit. Please excuse any typographical errors that have not been edited out.

## 2020-02-29 NOTE — ED Triage Note (Signed)
Patient reports falling down stairs on saturday when her leg gave out. Patient reports significant left foot pain and swelling since fall. Difficulty bearing weight. Patient denies any other complaints, no obvious distress at this time

## 2020-02-29 NOTE — Narrator Note (Signed)
Patient Disposition  Patient education for diagnosis, medications, activity, diet and follow-up.  Patient left ED 12:22 PM.  Patient rep received written instructions.    Interpreter to provide instructions: No    Patient belongings with patient: YES    Have all existing LDAs been addressed? N/A    Have all IV infusions been stopped? N/A    Destination: Discharged to home

## 2020-03-01 ENCOUNTER — Ambulatory Visit
Admission: RE | Admit: 2020-03-01 | Discharge: 2020-03-01 | Disposition: A | Discharge status: Home / Self Care | Payer: Medicare Other | Attending: Physician Assistant | Admitting: Physician Assistant

## 2020-03-01 ENCOUNTER — Other Ambulatory Visit: Payer: Self-pay

## 2020-03-01 DIAGNOSIS — R000 Tachycardia, unspecified: Secondary | ICD-10-CM | POA: Insufficient documentation

## 2020-03-01 LAB — ECHOCARDIOGRAM W/ DOPPLER: LVEF: 65 %

## 2020-03-03 ENCOUNTER — Encounter (HOSPITAL_BASED_OUTPATIENT_CLINIC_OR_DEPARTMENT_OTHER): Payer: Self-pay

## 2020-03-03 NOTE — Discharge Instructions (Signed)
SURGICAL DAY CARE PRE-OPERATIVE INSTRUCTIONS    Before your Surgery    Getting Tested for COVID-19  You need to get tested for COVID before your surgery. This will be at our testing site at [place].     4/20 at 9:45 am    Please follow social distancing after your COVID test. You need to stay isolated as much as possible until your surgery. Please tell your surgeon if:  ? you get a fever or other symptoms of illness  ? you, or someone you live with, is exposed to a person with COVID or someone who is sick      The Day of Surgery  You should have someone drop you off at the hospital at the right time. Please remember to wear a face mask.  When you get here, please go to the registration area of the hospital.       When to [facility] on Wednesday, April  21 at (Time): 10:15 am.      Currently, visitors cannot wait for you in the hospital during surgery. This is to protect all our patients. Please talk to [place] if you are a parent with a patient under 18 or a caregiver for a disabled patient.     You can not drive after surgery. You must have an adult pick you up and bring you home. Your ride should plan to pick you up outside the hospital.      We will call your ride to let them know when you can leave and where to meet you. They should call the recovery room (we will give you the number) when they arrive. We will bring you out to meet them.      We suggest you plan to have someone help you at home after your surgery.     Other Instructions:      The night before your surgery, please do not eat or drink anything after midnight. You can not even have water, gum or hard candy.       Do not smoke the morning of your surgery.      Please leave all valuables at home. This includes jewelry, watches, and money.      Please take off fingernail polish before coming to the hospital. Do not wear any face or lip make-up.      If you are having eye surgery, do not wear any  eye or face makeup. Avoid facial moisturizers and perfumes.      Please take out any hair extensions that you can remove.      Do not shave your surgical site.      Do not wear contact lenses.     Medicine:     Take the following medicine the night before surgery at bedtime:      usual medicines     Take the following medicine the morning of your surgery with only a sip of water:         usual morning medicines, except hold metformin until after surgery

## 2020-03-03 NOTE — Surgery Pre-Op (Signed)
PAT Visit          Samantha Cameron is a 64 year old female received a preoperative screening.        Appointments for Next 30 Days 03/03/2020 - 04/02/2020      Date Visit Type Department Provider     03/04/2020 11:00 AM PRE-ADMISSION [facility] PHONE SCREEN    Patient Instructions:     On this date, you will receive a phone call from a [facility] nurse to discuss your upcoming procedure. You do not need to come to the hospital for this appointment               03/08/2020  9:45 AM PRE-OP [facility] COVID-19    Patient Instructions:                  03/10/2020  1:00 PM POST OP [place] Charise Carwin, MD    Patient Instructions:                  03/16/2020 11:00 AM POST OP [place], [place]    Patient Instructions:                  03/17/2020  1:05 PM COVID-19 BOOSTER (28 DAY) [place] [facility] [place] ([place])    Patient Instructions:                   Surgery Information     Future Procedures (Tomorrow to 03/03/2021)       Date Time Procedures Providers Location Status        03/09/2020 1115 Cataract Surgery With Pc Iol (Phaco) - Left Charise Carwin [facility] OR Scheduled Open case     Procedures: Cataract Surgery With Pc Iol (Phaco) - Left                      Procedure(s):  CATARACT SURGERY WITH PC IOL (PHACO)  03/09/2020        .     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  1. GERD - severe 2.                Unintentional weight loss 3. Abdominal pain   Plan: 1.                Recommend EGD. This procedure has been fully reviewed                with the patient and written informed consent has been                obtained. 2.   No date: Esophageal reflux  No date: External hemorrhoid      Comment:  per steward records  04/19/2017: Gastritis without bleeding      Comment:  GI endoscopy: 06/2018: Mildly severe reflux esophagitis.               Biopsied.      - Chronic gastritis. Biopsied.      - No                gross lesions in the entire examined duodenum.                Complications:  No immediate complications.                Recommendation:      - Await pathology results. 7/2019GI:               Assessment: Pt is 68F with PMH COPD, morbid obesity,                H.pylori (2009, unclear if treated) who p/w severe GERD                symptoms, unintentional weigh  05/10/2018: Gastroesophageal reflux disease without esophagitis  No date: HTN (hypertension)  02/01/2012: Hypoxia  02/16/2017: Left ear pain  06/07/2015: Left genital labial abscess  06/16/2015: Obesity, Class III, 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y) ([place])  08/16/2015: Osteopenia      Comment:   On xray of left knee per steward records 06/2014 Unclear                if addressed  05/23/2018: Pancreatic lesion  09/14/2015: Right ear impacted cerumen  No date: Sleep apnea  08/15/2015: Type 2 diabetes mellitus without complication, without   long-term current use of insulin (HCC)      Comment:   HEMOGLOBIN A1C (%) Date Value 07/15/2015 6.2 (H)                ----------  Steward record - a1c 6.2 on 01/31/15 6.4 on                09/30/14 5.9 on 12/29/13 6.4 08/18/13   11/18/2017: Viral URI with cough     has a past surgical history that includes LAPS SURG CHOLECSTC; [place] FSCA&BRS KNEE&/POP AREA; TONSILLECTOMY&ADENOIDECTOMY UNDER AGE 7; 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         Alcohol, Tobacco and Drug History:  Social History    Tobacco Use      Smoking status: Former Smoker        Packs/day: 0.50        Years: 31.00        Pack years: 15.5        Types: Cigarettes        Quit date: 03/10/2018        Years since quitting: 1.9      Smokeless tobacco: Never Used      Tobacco comment: quit smoking inform provided to patient    E-Cigarette/Vaping  E-Cigarette Use:   Quit Date:   Nicotine:   Start Date:   THC:    Cartridges/Day:   CBD:   Other Substance:   Flavoring:       Alcohol use No       Drug use: No       Extended Emergency Contact Information  Primary Emergency Contact: Cassio,Gina  Address: [address], [place] [address]  Home Phone: [phone]  Relation: Daughter  Secondary Emergency Contact: Ocasio,Erik  Address: [address] [place], [place] [address]  Mobile Phone: [phone]  Relation: Son        Current Outpatient Medications   Medication Sig    metoprolol (TOPROL-XL) 50 MG 24 hr tablet Take 1 tablet by mouth daily    lisinopril (ZESTRIL) 10 MG tablet Take 1 tablet by mouth daily    umeclidinium (INCRUSE ELLIPTA) 62.5 MCG/INH inhaler Inhale 1 puff into the lungs daily     nicotine (NICODERM CQ) 14 MG/24HR Place 1 patch onto the skin daily    metFORMIN (GLUCOPHAGE-XR) 500 MG 24 hr tablet take 4 tablets by mouth once daily with BREAKFAST    atorvastatin (LIPITOR) 20 MG tablet take 1 tablet by mouth once daily    Misc. Devices (BATH/SHOWER SEAT) MISC Device 1 Device by Other route daily    albuterol (PROVENTIL) (2.5 MG/3ML) 0.083% nebulizer solution Take 3 mLs by nebulization every 6 (six) hours as needed for Wheezing Dx: Code: J44.9    FLUoxetine (PROZAC) 20 MG capsule Take 1 capsule by mouth daily    cholecalciferol (VITAMIN D3) 1000 UNIT tablet Take 1 tablet by mouth daily    pregabalin (LYRICA) 100 MG capsule Take 100 mg by mouth as needed With Dr Dwyane Dee         oxycodone-acetaminophen (PERCOCET) 5-325 MG per tablet Take 1 tablet by mouth 2 (two) times daily as needed With Dr Dwyane Dee    aspirin 81 MG tablet Take 81 mg by mouth daily.     No current facility-administered medications for this encounter.         (Not in a hospital admission)      PAT Assessment    Airways WDL     Activity tolerance (How many flights of stairs): some sob with sstairs  no   cp    Assistive Device: None, Cane          PAT Screening     Row Name 03/03/20 1537       Integumentary - Complete full head to toe skin assessment    Integumentary (WDL)  WDL    Row Name 03/03/20 1537       STOP-Bang Questionaire  Do you snore loudly (loud enough to be heard through closed doors or   your bed-partner elbows you for snoring at night?  1    Do you often feel Tired, fatigued, or Sleepy during the daytime (such as   falling asleep when driving)?  1    Has anyone observed you stop breathing, or choking/gasping during your   sleep?  1    Do you have, or are being treated for, High Blood Pressure?  1    Height  5\' 1"  (1.549 m)    Weight  99.8 kg (220 lb)    BMI (Calculated)  41.66    Neck size large?  1    Stop-Bang Score  High                 03/03/20  1537   Weight: 99.8 kg (220 lb)   Height: 5\' 1"  (1.549 m)            Patient issues currently are    phone screen done with patient, all meds and pre op instructions reviewed with expressed understanding.Donalda Ewings, RN

## 2020-03-04 ENCOUNTER — Ambulatory Visit
Admission: RE | Admit: 2020-03-04 | Discharge: 2020-03-04 | Disposition: A | Discharge status: Home / Self Care | Payer: Medicare Other

## 2020-03-04 ENCOUNTER — Encounter (HOSPITAL_BASED_OUTPATIENT_CLINIC_OR_DEPARTMENT_OTHER): Payer: Self-pay | Admitting: Ophthalmology

## 2020-03-06 DIAGNOSIS — R000 Tachycardia, unspecified: Secondary | ICD-10-CM

## 2020-03-07 ENCOUNTER — Other Ambulatory Visit (HOSPITAL_BASED_OUTPATIENT_CLINIC_OR_DEPARTMENT_OTHER): Payer: Self-pay | Admitting: Internal Medicine

## 2020-03-07 NOTE — Telephone Encounter (Signed)
Dose adjusment    Med on file with Pharmacy:  Pharmacist at [facility] confirmed that Lisinopril has  active refills remaining. No refill is required at this time.

## 2020-03-08 ENCOUNTER — Ambulatory Visit
Admission: RE | Admit: 2020-03-08 | Discharge: 2020-03-08 | Disposition: A | Discharge status: Home / Self Care | Payer: Medicare Other | Attending: Family Medicine | Admitting: Family Medicine

## 2020-03-08 DIAGNOSIS — Z20822 Contact with and (suspected) exposure to covid-19: Secondary | ICD-10-CM | POA: Diagnosis not present

## 2020-03-08 LAB — COVID-19 OUTPATIENT: COVID-19 OUTPATIENT: NEGATIVE

## 2020-03-08 NOTE — Anesthesia Preprocedure Evaluation (Addendum)
Pre-Anesthetic Note  .  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]    Location: [place] OR 6 / [place] OR    Surgeons: Charise Carwin, MD          Relevant Problems   No relevant active problems       _0 @      Previous Anesthetic History:   Past Surgical History:  No date: [place] FASCIA&BURSA KNEE&/POPLT  No date: FOOT SURGERY      Comment:  bilateral  No date: LAPAROSCOPY SURG CHOLECYSTECTOMY  No date: OB ANTEPARTUM CARE CESAREAN DLVR & POSTPARTUM      Comment:  x3  No date: TONSILLECTOMY & ADENOIDECTOMY <AGE 89  04/13/2009: TOTAL KNEE REPLACEMENT      Comment:  left  08/11/2009: WRIST GANGLION EXCISION      Comment:  left     Current Medications:    No current facility-administered medications for this encounter.  metoprolol (TOPROL-XL) 50 MG 24 hr tablet, Take 1 tablet by mouth daily, Disp: 90 tablet, Rfl: 1  lisinopril (ZESTRIL) 10 MG tablet, Take 1 tablet by mouth daily, Disp: 90 tablet, Rfl: 1  umeclidinium (INCRUSE ELLIPTA) 62.5 MCG/INH inhaler, Inhale 1 puff into the lungs daily, Disp: 1 Inhaler, Rfl: 0  nicotine (NICODERM CQ) 14 MG/24HR, Place 1 patch onto the skin daily, Disp: 42 patch, Rfl: 0  metFORMIN (GLUCOPHAGE-XR) 500 MG 24 hr tablet, take 4 tablets by mouth once daily with BREAKFAST, Disp: 360 tablet, Rfl: 3  atorvastatin (LIPITOR) 20 MG tablet, take 1 tablet by mouth once daily, Disp: 90 tablet, Rfl: 3  Misc. Devices (BATH/SHOWER SEAT) MISC Device, 1 Device by Other route daily, Disp: 1 each, Rfl: 0  albuterol (PROVENTIL) (2.5 MG/3ML) 0.083% nebulizer solution, Take 3 mLs by nebulization every 6 (six) hours as needed for Wheezing Dx: Code: J44.9, Disp: 75 mL, Rfl: 1  FLUoxetine (PROZAC) 20 MG  capsule, Take 1 capsule by mouth daily, Disp: 30 capsule, Rfl: 2  cholecalciferol (VITAMIN D3) 1000 UNIT tablet, Take 1 tablet by mouth daily, Disp: , Rfl:   pregabalin (LYRICA) 100 MG capsule, Take 100 mg by mouth as needed With Dr Dwyane Dee     , Disp: , Rfl:   oxycodone-acetaminophen (PERCOCET) 5-325 MG per tablet, Take 1 tablet by mouth 2 (two) times daily as needed With Dr Dwyane Dee, Disp: , Rfl:   aspirin 81 MG tablet, Take 81 mg by mouth daily., Disp: , Rfl:         Home Medications  No medications prior to admission.      Allergies:   Review of Patient's Allergies indicates:   Codeine camsylate       Shortness of Breath, Other (See                            Comments)    Comment:Chest pain   Motrin [ibuprofen]      Nausea Only   Sulfa antibiotics       Other (See Comments)   Bactrim  Rash, Itching    Smoking, Alcohol, Drugs:  Social History    Tobacco Use      Smoking status: Former Smoker        Packs/day: 0.50        Years: 31.00        Pack years: 15.5        Types: Cigarettes        Quit date: 03/10/2018        Years since quitting: 1.9      Smokeless tobacco: Never Used      Tobacco comment: quit smoking inform provided to patient    Alcohol use: No      Drug use: No       PMHx:  Past Medical History:  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  1. GERD - severe 2.                Unintentional weight loss 3. Abdominal pain   Plan: 1.                Recommend EGD. This procedure has been fully reviewed                with the patient and written informed consent has been                obtained. 2.   No date: Esophageal reflux  No date: External hemorrhoid      Comment:  per steward records  04/19/2017: Gastritis without bleeding      Comment:  GI endoscopy: 06/2018: Mildly severe reflux esophagitis.               Biopsied.      - Chronic gastritis. Biopsied.      - No                gross lesions in the entire examined duodenum.  Complications:  No immediate complications.                Recommendation:      - Await pathology results. 7/2019GI:               Assessment: Pt is 70F with PMH COPD, morbid obesity,                H.pylori (2009, unclear if treated) who p/w severe GERD                symptoms, unintentional weigh  05/10/2018: Gastroesophageal reflux disease without esophagitis  No date: HTN (hypertension)  02/01/2012: Hypoxia  02/16/2017: Left ear pain  06/07/2015: Left genital labial abscess  06/16/2015: Obesity, Class III, 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y) ([place])  08/16/2015: Osteopenia      Comment:  On xray of left knee per steward records 06/2014 Unclear                if addressed  05/23/2018: Pancreatic lesion  09/14/2015: Right ear impacted cerumen  No date: Sleep apnea  08/15/2015: Type 2 diabetes mellitus without complication, without   long-term current use of insulin (HCC)      Comment:   HEMOGLOBIN A1C (%) Date Value 07/15/2015 6.2 (H)                 ----------  Steward record - a1c 6.2 on 01/31/15 6.4 on                09/30/14 5.9 on 12/29/13 6.4 08/18/13   11/18/2017: Viral URI with cough    Vitals  LMP 10/22/2007 (LMP Unknown)     Review of Systems     Patient summary reviewed      Anesthetic History:   negative anesthesia history ROS           Cardiovascular: Positive for chest pain, hypertension and arrhythmia (First degree AV block).        Chronic venous insufficiency  Orthostatic hypotension   Pulmonary: Positive for obstructive sleep apnea, chronic bronchitis and COPD (Obstructive PFTs). Tobacco abuse, smoker           GU/Renal: Negative.    Hepatic: Positive for other liver disease (Fatty liver).   Neurological: Positive for headaches.   Gastrointestinal: Positive for GERD and gastritis/esophagitis.   Hematological: Negative.    Endocrine: Positive for Diabetes (Type 2, w/ microalbuminuria) and other (Pancreatic lesion, uncharacterized).   Eyes:  Positive for cataract.   Nose:  Positive for congestion (Allergic rhinitis) and sinus pressure (Hx sinusitis ).   Musculoskeletal: Positive for arthralgias (OA) and back pain.   Psychiatric: Positive for depression.    Constitutional: Negative.    Other:BMI greater than 40 kg/m2 (Morbid obesity)      Physical Exam    General     Level of consciousness:  Alert   Airway     Mallampati:  II    TM distance:  >3 FB    Mouth opening:  >3 FB    Neck ROM:  Full   Teeth  - normal exam  }   Heart   - normal exam     Lungs - normal exam               Pertinent Labs:   Lab Results   Component Value Date    NA 141 01/25/2020    K 4.8 01/25/2020    CREAT 0.8 01/25/2020    GLUCOSER 95 01/25/2020  WBC 12.7 (H) 01/25/2020    HCT 43.5 01/25/2020    PLTA 315 01/25/2020    PT 11.0 04/18/2017    APTT 30.5 04/18/2017    INR 1.0 04/18/2017     EKG REPORT   Test Reason : REPEAT    Blood Pressure :  mmHG    Vent. 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 Left Ventricle: Sigmoid shaped septum with focal hypertrophy of the basal septum. The remaining wall thickness is mildly thickened.   2. Left Ventricle: Global systolic function: EF is estimated visually at 65%.   3. Left Ventricle: Regional systolic function: Wall motion: There are no regional wall motion abnormalities.   4. Left Ventricle: There is grade I diastolic dysfunction (impaired relaxation).   5. Tricuspid Valve: There is insufficient regurgitation to estimate RVSP.   6. As compared to the previous echocardiogram on 04/29/18, the LV wall thickness is now mildly thickened.   03/01/2020 1:04 PM - Interface, Cardiovascular        Anesthesia Plan    ASA Score:     ASA:  3    Airway:      Mallampati:  II    Mouth opening:  >3 FB    Neck ROM:  Full    TM distance:  >3 FB    Plan: MAC    Other information:     EKG Reviewed: : Yes      Full Stomach Precaution:: No      Post-Plan::  PACU    Anesthesia Assessment and Plan:        MAC    Informed Consent:     Anesthetic plan and risks discussed with:  Patient   Patient Consented        Attending Anesthesiologist Statement:     Reassessed day of surgery: Yes        Assessment made, necessary equipment and appropriate plan in place.

## 2020-03-09 ENCOUNTER — Ambulatory Visit (HOSPITAL_BASED_OUTPATIENT_CLINIC_OR_DEPARTMENT_OTHER): Payer: Medicare Other | Admitting: Anesthesiology

## 2020-03-09 ENCOUNTER — Encounter (HOSPITAL_BASED_OUTPATIENT_CLINIC_OR_DEPARTMENT_OTHER)
Admission: RE | Disposition: A | Discharge status: Home / Self Care | Payer: Self-pay | Source: Ambulatory Visit | Attending: Ophthalmology

## 2020-03-09 ENCOUNTER — Ambulatory Visit
Admission: RE | Admit: 2020-03-09 | Discharge: 2020-03-09 | Disposition: A | Discharge status: Home / Self Care | Payer: Medicare Other | Attending: Ophthalmology | Admitting: Ophthalmology

## 2020-03-09 ENCOUNTER — Encounter (HOSPITAL_BASED_OUTPATIENT_CLINIC_OR_DEPARTMENT_OTHER): Payer: Self-pay | Admitting: Ophthalmology

## 2020-03-09 DIAGNOSIS — H25012 Cortical age-related cataract, left eye: Secondary | ICD-10-CM | POA: Diagnosis present

## 2020-03-09 DIAGNOSIS — Z6841 Body Mass Index (BMI) 40.0 and over, adult: Secondary | ICD-10-CM | POA: Diagnosis not present

## 2020-03-09 DIAGNOSIS — I10 Essential (primary) hypertension: Secondary | ICD-10-CM | POA: Diagnosis not present

## 2020-03-09 DIAGNOSIS — F329 Major depressive disorder, single episode, unspecified: Secondary | ICD-10-CM | POA: Diagnosis present

## 2020-03-09 DIAGNOSIS — E669 Obesity, unspecified: Secondary | ICD-10-CM | POA: Diagnosis present

## 2020-03-09 DIAGNOSIS — E119 Type 2 diabetes mellitus without complications: Secondary | ICD-10-CM | POA: Diagnosis present

## 2020-03-09 DIAGNOSIS — J449 Chronic obstructive pulmonary disease, unspecified: Secondary | ICD-10-CM | POA: Diagnosis present

## 2020-03-09 DIAGNOSIS — K219 Gastro-esophageal reflux disease without esophagitis: Secondary | ICD-10-CM | POA: Diagnosis not present

## 2020-03-09 SURGERY — PHACOEMULSIFICATION, CATARACT, WITH POSTERIOR CHAMBER IOL INSERTION
Anesthesia: Monitor Anesthesia Care | Site: Eye | Laterality: Left | Wound class: Class I/ Clean

## 2020-03-09 MED ORDER — LIDOCAINE HCL (PF) 1 % IJ SOLN
INTRAMUSCULAR | Status: AC
Start: 2020-03-09 — End: 2020-03-09
  Administered 2020-03-09: 1 mL via OPHTHALMIC
  Filled 2020-03-09: qty 2

## 2020-03-09 MED ORDER — FLURBIPROFEN SODIUM 0.03 % OP SOLN
1.0000 [drp] | OPHTHALMIC | Status: AC
Start: 2020-03-09 — End: 2020-03-09
  Administered 2020-03-09 (×3): 1 [drp] via OPHTHALMIC

## 2020-03-09 MED ORDER — CEFAZOLIN SODIUM 1 G IJ SOLR
INTRAMUSCULAR | Status: AC
Start: 2020-03-09 — End: 2020-03-09
  Administered 2020-03-09: 50 mg via OPHTHALMIC
  Filled 2020-03-09: qty 10

## 2020-03-09 MED ORDER — TETRACAINE HCL 0.5 % OP SOLN
OPHTHALMIC | Status: AC
Start: 2020-03-09 — End: 2020-03-09
  Administered 2020-03-09: 1 via OPHTHALMIC
  Filled 2020-03-09: qty 4

## 2020-03-09 MED ORDER — BSS IO SOLN
INTRAOCULAR | Status: AC
Start: 2020-03-09 — End: 2020-03-09
  Administered 2020-03-09: 1
  Filled 2020-03-09: qty 15

## 2020-03-09 MED ORDER — DEXAMETHASONE SODIUM PHOSPHATE 4 MG/ML IJ SOLN
INTRAMUSCULAR | Status: AC
Start: 2020-03-09 — End: 2020-03-09
  Administered 2020-03-09: 2 mg
  Filled 2020-03-09: qty 1

## 2020-03-09 MED ORDER — MOXIFLOXACIN HCL 0.5 % OP SOLN
1.0000 [drp] | OPHTHALMIC | Status: AC
Start: 2020-03-09 — End: 2020-03-09
  Administered 2020-03-09 (×3): 1 [drp] via OPHTHALMIC

## 2020-03-09 MED ORDER — ERYTHROMYCIN 5 MG/GM OP OINT
0.50 [in_us] | TOPICAL_OINTMENT | Freq: Once | OPHTHALMIC | Status: AC
Start: 2020-03-09 — End: 2020-03-09
  Administered 2020-03-09: 0.5 [in_us] via OPHTHALMIC

## 2020-03-09 MED ORDER — PREDNISOLONE ACETATE 1 % OP SUSP
1.0000 [drp] | Freq: Four times a day (QID) | OPHTHALMIC | Status: DC
Start: 2020-03-09 — End: 2020-03-09
  Administered 2020-03-09: 1 [drp] via OPHTHALMIC

## 2020-03-09 MED ORDER — TROPICAMIDE 1 % OP SOLN
1.0000 [drp] | OPHTHALMIC | Status: AC
Start: 2020-03-09 — End: 2020-03-09
  Administered 2020-03-09 (×3): 1 [drp] via OPHTHALMIC

## 2020-03-09 MED ORDER — BUPIVACAINE HCL (PF) 0.75 % IJ SOLN
INTRAMUSCULAR | Status: DC
Start: 2020-03-09 — End: 2020-03-09
  Filled 2020-03-09: qty 10

## 2020-03-09 MED ORDER — ACETYLCHOLINE CHLORIDE 20 MG IO SOLR
INTRAOCULAR | Status: DC
Start: 2020-03-09 — End: 2020-03-09
  Filled 2020-03-09: qty 2

## 2020-03-09 MED ORDER — PHENYLEPHRINE HCL 2.5 % OP SOLN
1.0000 [drp] | OPHTHALMIC | Status: AC
Start: 2020-03-09 — End: 2020-03-09
  Administered 2020-03-09 (×3): 1 [drp] via OPHTHALMIC

## 2020-03-09 MED ORDER — CYCLOPENTOLATE HCL 1 % OP SOLN
1.0000 [drp] | OPHTHALMIC | Status: AC
Start: 2020-03-09 — End: 2020-03-09
  Administered 2020-03-09 (×3): 1 [drp] via OPHTHALMIC

## 2020-03-09 MED ORDER — ACETAMINOPHEN 325 MG PO TABS
650.0000 mg | ORAL_TABLET | Freq: Four times a day (QID) | ORAL | Status: DC | PRN
Start: 2020-03-09 — End: 2020-03-09

## 2020-03-09 MED ORDER — LIDOCAINE-EPINEPHRINE 2 %-1:200000 IJ SOLN
INTRAMUSCULAR | Status: DC
Start: 2020-03-09 — End: 2020-03-09
  Filled 2020-03-09: qty 20

## 2020-03-09 MED ORDER — BSS IO SOLN
Freq: Once | INTRAOCULAR | Status: AC
Start: 2020-03-09 — End: 2020-03-09
  Administered 2020-03-09: 500 mL via OPHTHALMIC
  Filled 2020-03-09: qty 0.5

## 2020-03-09 SURGICAL SUPPLY — 19 items
ALCON EYE PACK W/BSS SOLUTION (PACK) ×2 IMPLANT
ANTERIOR CHAMBER CANNULA 27GA (OPHTH) ×2 IMPLANT
ANTERIOR CHAMBER CANNULA 30GA (OPHTH) IMPLANT
BI-POLAR CABLE (CAUTERY) IMPLANT
BLUNT FILTER NEEDLE 18X1.5 (NEEDLE) IMPLANT
DRESSING SPONGE 4X4 (DRESSING) ×4 IMPLANT
EKG LEAD 3 SNAP (EKG) ×2 IMPLANT
EXTENSION SET MACROBORE (IVSETS) ×2 IMPLANT
I/A HANDPIECE 45D SILICONE (OPHINST) ×2 IMPLANT
I/O LENS +16.5 SN60WF ×2 IMPLANT
INSYTE IV CATHETER 20GAX1.16IN (IVCATH) ×2 IMPLANT
MONARCH III IOL INSERTER CART (LENSACC) ×2 IMPLANT
NASAL CANNULA W/CO2 SAMPLING (CANNULA) ×2 IMPLANT
NYLON SUTURE 10-0 AU8 (SUTURE) ×2 IMPLANT
PACK EYE (PACK) ×2 IMPLANT
RETROBULBAR NEEDLE 25GAX38MM (NEEDLE) IMPLANT
SLIT KNIFE 2.8MM ANGLD SGL BVL (OPHSURG) ×2 IMPLANT
STERILE WATER IRR. 500ML (SOLUTION) ×2 IMPLANT
SURGEON GLOVE LF/PF 8 STER (GLOVE) ×4 IMPLANT

## 2020-03-09 NOTE — Anesthesia Postprocedure Evaluation (Signed)
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])    Surgeons: Charise Carwin, MD Responsible Provider: Bertrum Sol, MD    Anesthesia Type: MAC ASA Status: 3            POST-OPERATIVE EVALUATION    Anesthesia Post Evaluation    Vitals signs in patient's normal range: Yes  Respiratory function stable; airway patent: Yes  Cardiovascular function stable: Yes  Hydration status stable: Yes  Mental status recovered; patient participates in evaluation and/or is at baseline: Yes  Pain control satisfactory: Yes  Nausea and vomiting control satisfactory: Yes    Procedure was labor & delivery no  PostOP disposition PACU  Anesthesia Observation no significant observation      Last vitals  BP 128/82       Temp 97.8       Pulse 76      Resp 16       SpO2 96

## 2020-03-09 NOTE — Discharge Instructions (Signed)
You need to be seen at the  [facility] Tomorrow.  The office is located at [address], [place].  The phone number is [phone] if you have any questions.     Start taking Vigamox and Pred forte one drop of each in the eye that had surgery today  4 time per day starting TODAY after Dinner (around 5:30 PM)     .  Use both drops after lunch, dinner, before bed, and after breakfast.  Put the shield back over your eye between taking the drops and secure with a piece of tape    Call the office  [phone] if you have any problems of concerns.  The answering service will answer the call 24 hours per day.  Ask them to page Dr. Oshae Simmering

## 2020-03-09 NOTE — H&P (Signed)
Today I have discussed symptoms and complaints with the patient, reviewed the patient's vital signs, and repeated a focused examination of the patient. There are not any changes since the complete H&P which was already done by the patient's PCP, De. [name] , on 02/11/20 ,which I have reviewed today.    03/09/20  1008   BP: 135/80   Pulse: 90   Resp: 20   Temp: 98.1 F (36.7 C)   TempSrc: Temporal   SpO2: 97%   Weight: 100.6 kg (221 lb 12.8 oz)   Height: 5\' 2"  (1.575 m)       The patient was in no apparent distress    HEENT-grossly wnl  Cor-S1S2  Lungs-Clear  Abdomen-soft, non tender  Extremities-no gross edema/swelling    ROS-no new complaints or symptoms  Medications reviewed  Allergies reviewed    The patient is medically stable to proceed with the planned ocular surgery today.

## 2020-03-09 NOTE — Op Note (Addendum)
PREOPERATIVE DIAGNOSIS:    Cataract left eye.    POSTOPERATIVE DIAGNOSIS:        Same.    Date of Procedure-                                                      03/09/2020     OPERATION:                                                Clear corneal cataract extraction with                                                                  posterior chamber intraocular lens                      model [id number]                                                                +16.5   diopters  left    eye.    SURGEON: Wenda Low. Brookelynne Dimperio, MD    ANESTHESIA:                     Intracameral and topical    COMPLICATIONS:                           None.    After a Time Out to identify the correct operative eye, the left eye was prepped and draped in the usual manner appropriate for intraocular surgery.  The operating microscope was used for good surgical control at all times.   A paracentis was performed with a super blade.  Preservative free lidocaine was injected into the anterior chamber.  A viscoelastic material was then injected into the anterior chamber to deepen it.  A clear corneal temporal wound was created using a 2.8 mm keratome in a triplanar fashion approximately 1.5 mm from the limbus. A continuous curvilinear  capsulorrhexis was performed using a bent-needle cystotome and capsulorrhexis forceps.  Phacoemulsification of the nucleus was performed using a quartering-and-cracking technique. Automated irrigation/aspiration of the residual cortical material was then performed.  The intraocular lens was then injected into the capsular bag. Automated irrigation/aspiration of  residual viscoelastic material was then performed.  Balanced salt solution was used to inflate the anterior chamber. 2 10-0 nylon sutures were placed to compress the clear corneal wound. The wound was tested and found to be watertight.  A Subtenon injections with 50 mg Kefzol and 2 mg dexamethasone were administered inferiorly, well away from the  wound.  A drop of Vigamox was placed on the surface of the globe, as well of a ribbon of Ilotycin ointment.  A Sterile  shield was placed over the eye and secured with paper tape.    The patient was returned to the ambulatory Day Surgery Unit in good conditions.  The patient was instructed to start taking Vigamox and Prednisolone eye drops in the post operative eye starting 4 hours after the surgery. The patient was instructed to put the shield back on over the eye after putting in the drops and to make sure it is secured with tape.    The patient was also instructed to follow up with Dr. Lonia Chimera the following day and to call the office and have him paged if there are any concerns at any time.

## 2020-03-10 ENCOUNTER — Other Ambulatory Visit: Payer: Self-pay

## 2020-03-10 ENCOUNTER — Ambulatory Visit: Payer: Medicare Other | Attending: Internal Medicine | Admitting: Ophthalmology

## 2020-03-10 DIAGNOSIS — H2512 Age-related nuclear cataract, left eye: Secondary | ICD-10-CM | POA: Diagnosis not present

## 2020-03-10 MED ORDER — PREDNISOLONE ACETATE 1 % OP SUSP
1.00 [drp] | Freq: Four times a day (QID) | OPHTHALMIC | 1 refills | Status: AC
Start: 2020-03-10 — End: 2020-04-09

## 2020-03-10 NOTE — Progress Notes (Signed)
Samantha Cameron was seen day #1 s/p uncomplicated cataract surgery left eye.    She was doing well.    She was told to  use Pred Forte 1% and Vigamox 1 gtt in the post operative eye QID.      She was instructed to cover the operated eye with the metal shield while sleeping for the first week and to return to clinic in  one week for follow up.     She was also instructed to return to clinic immediately if there is decreased vision, ocular pain, or any other problems that develop.

## 2020-03-10 NOTE — Progress Notes (Signed)
X1 day S/P PC IOL OS     1-cataract OD  2-NIDDM ,no DMR    C/o left eye sore   Explained post op instruction after surgery .    HEMOGLOBIN A1C (%)   Date Value   09/29/2019 6.1 (H)   11/05/2018 6.1 (H)   05/14/2018 6.3 (H)       No results found for: POCA1C

## 2020-03-14 ENCOUNTER — Other Ambulatory Visit (HOSPITAL_BASED_OUTPATIENT_CLINIC_OR_DEPARTMENT_OTHER): Payer: Medicare Other

## 2020-03-16 ENCOUNTER — Ambulatory Visit: Payer: Medicare Other | Attending: Internal Medicine | Admitting: Optometrist

## 2020-03-16 DIAGNOSIS — Z9842 Cataract extraction status, left eye: Secondary | ICD-10-CM | POA: Diagnosis not present

## 2020-03-16 DIAGNOSIS — M47817 Spondylosis without myelopathy or radiculopathy, lumbosacral region: Secondary | ICD-10-CM | POA: Diagnosis not present

## 2020-03-16 DIAGNOSIS — Z961 Presence of intraocular lens: Secondary | ICD-10-CM | POA: Diagnosis not present

## 2020-03-16 DIAGNOSIS — Z79891 Long term (current) use of opiate analgesic: Secondary | ICD-10-CM | POA: Diagnosis not present

## 2020-03-16 DIAGNOSIS — M5417 Radiculopathy, lumbosacral region: Secondary | ICD-10-CM

## 2020-03-16 NOTE — Progress Notes (Signed)
Samantha Cameron is doing well one week s/p phacoemulsification with Posterior Chamber Intraocular lens implant of the left eye.  she is instructed to discontinue the Vigamox / Ciloxan eye drops one week after surgery.  she will use Pred Forte 1% or Econopred eye drops one drop, four times a day for one month totally in the  left eye.   The patient will call us immediately for any eye pain, increased redness, or for decreased vision.  Otherwise she will f/u in three-four weeks for refraction and dilated examination.

## 2020-03-17 ENCOUNTER — Ambulatory Visit: Payer: Medicare Other | Attending: Internal Medicine

## 2020-03-17 ENCOUNTER — Other Ambulatory Visit: Payer: Self-pay

## 2020-03-17 DIAGNOSIS — Z23 Encounter for immunization: Secondary | ICD-10-CM | POA: Insufficient documentation

## 2020-03-18 ENCOUNTER — Encounter (HOSPITAL_BASED_OUTPATIENT_CLINIC_OR_DEPARTMENT_OTHER): Payer: Self-pay | Admitting: Student in an Organized Health Care Education/Training Program

## 2020-03-18 DIAGNOSIS — I10 Essential (primary) hypertension: Secondary | ICD-10-CM | POA: Insufficient documentation

## 2020-03-18 DIAGNOSIS — I491 Atrial premature depolarization: Secondary | ICD-10-CM | POA: Insufficient documentation

## 2020-04-06 DIAGNOSIS — F40231 Fear of injections and transfusions: Secondary | ICD-10-CM | POA: Diagnosis not present

## 2020-04-06 DIAGNOSIS — M5416 Radiculopathy, lumbar region: Secondary | ICD-10-CM | POA: Diagnosis not present

## 2020-04-07 ENCOUNTER — Other Ambulatory Visit (HOSPITAL_BASED_OUTPATIENT_CLINIC_OR_DEPARTMENT_OTHER): Payer: Self-pay | Admitting: Internal Medicine

## 2020-04-07 MED ORDER — LISINOPRIL 10 MG PO TABS
10.0000 mg | ORAL_TABLET | Freq: Every day | ORAL | 1 refills | Status: DC
Start: 2020-04-07 — End: 2020-04-08

## 2020-04-07 NOTE — Telephone Encounter (Signed)
[facility], Samantha Cameron is a 64 year old female has requested a refill of lisinopril 10 .    Pharmacy requires a new rx please.    Last Office Visit: 02/11/2020 with christopher simons   Last Physical Exam: 08/15/2015      HTN Med:    Most Recent BP Reading(s)  03/09/20 : 128/87  02/29/20 : 109/60  02/19/20 : 116/70      Documented patient preferred pharmacies:    [facility] [place], [place]  Phone: [phone] Fax: [phone]

## 2020-04-08 ENCOUNTER — Ambulatory Visit: Payer: Medicare Other | Attending: Clinical Cardiac Electrophysiology | Admitting: Clinical Cardiac Electrophysiology

## 2020-04-08 ENCOUNTER — Other Ambulatory Visit: Payer: Self-pay

## 2020-04-08 ENCOUNTER — Encounter (HOSPITAL_BASED_OUTPATIENT_CLINIC_OR_DEPARTMENT_OTHER): Payer: Self-pay | Admitting: Clinical Cardiac Electrophysiology

## 2020-04-08 VITALS — BP 100/68 | HR 92 | Temp 98.1°F | Wt 220.0 lb

## 2020-04-08 DIAGNOSIS — G4733 Obstructive sleep apnea (adult) (pediatric): Secondary | ICD-10-CM | POA: Diagnosis not present

## 2020-04-08 DIAGNOSIS — I472 Ventricular tachycardia: Secondary | ICD-10-CM | POA: Diagnosis present

## 2020-04-08 DIAGNOSIS — F17200 Nicotine dependence, unspecified, uncomplicated: Secondary | ICD-10-CM | POA: Diagnosis present

## 2020-04-08 DIAGNOSIS — I471 Supraventricular tachycardia, unspecified: Secondary | ICD-10-CM

## 2020-04-08 DIAGNOSIS — E1129 Type 2 diabetes mellitus with other diabetic kidney complication: Secondary | ICD-10-CM | POA: Diagnosis not present

## 2020-04-08 DIAGNOSIS — R809 Proteinuria, unspecified: Secondary | ICD-10-CM

## 2020-04-08 DIAGNOSIS — I4729 Other ventricular tachycardia: Secondary | ICD-10-CM

## 2020-04-08 MED ORDER — LISINOPRIL 5 MG PO TABS
5.0000 mg | ORAL_TABLET | Freq: Every day | ORAL | 3 refills | Status: DC
Start: 2020-04-08 — End: 2021-01-06

## 2020-04-08 MED ORDER — METOPROLOL SUCCINATE ER 25 MG PO TB24
ORAL_TABLET | ORAL | 3 refills | Status: DC
Start: 2020-04-08 — End: 2020-08-01

## 2020-04-08 NOTE — Patient Instructions (Addendum)
Change Metoprolol XL to 50 mg in the morning and 25 mg in the evening      Change the Lisinopril to 5 mg in the evening once daily      Stress test on 04/26/2020      Holter monitor in about 1 month

## 2020-04-08 NOTE — Progress Notes (Signed)
OUTPATIENT ELECTROPHYSIOLOGY CONSULT NOTE  Date of visit: 04/08/2020    Patient is referred by Ileene Rubens, MD, and by Lorenz Coaster. [name], MD, for evaluation and management of occasional PSVT and rare NSVT.    In summary patient is 64 year old female with the following medical problems:   SVT (supraventricular tachycardia) (HCC)  (primary encounter diagnosis)   NSVT (nonsustained ventricular tachycardia) (HCC)   OSA (obstructive sleep apnea)   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 (HCC)   Type 2 diabetes mellitus with microalbuminuria, without long-term current use of insulin (HCC)   Microalbuminuria   Tobacco use disorder      HISTORY OF PRESENT ILLNESS;  64 year old woman with obesity, COPD, DM 2, tobacco use, OSA on CPAP (although currently without; needs new mask and tubing) who has a h/o non-specified tachycardia treated with low dose Toprol XL which she believes was started after a stress echo done in April 2013.    Recently, she was seen by her PCP on 01/25/2020 for leg pain and was noted to be tachycardic to 120 bpm; ECG was done - Preliminary computer read erroneously reported that the HR was 160 bpm; she was sent to the ED. Actual HR on the ECG was actually around 100 bpm (sinus).  She has inclomplete RBBB which intermittently becomes complete with respiratory rate variability.     She had reported intermittent short lived palpitations on the low dose Toprol XL 25 mg daily.     Event monitor was done which showed PACs and occasional PSVT lasting 3 beast up to 17 beats with rates up to 190 bpm; there was one episode of NSVT (4 beats at 218 bpm). Dr. [name] evalusted her and increased Toprol XL to 50 mg daily on 02/12/2020. Lisinopril was decreased from 20 mg to 10 mg daily given low normal BPs.    Dr. [name] recommended increasing it to 75 mg daily on 02/19/2020 but she has not done that yet.    She takes the Metoprolol XL at night.    She denies heart racing except for rarely when  she over exerts. No CP. No syncope or presyncope.    OSA - has  CPAP but does not use currently; needs new tubing and new mask.    Current Medications:  prednisoLONE acetate (PRED FORTE) 1 % ophthalmic suspension, Place 1 drop into the left eye 4 (four) times daily, Disp: 10 mL, Rfl: 1  umeclidinium (INCRUSE ELLIPTA) 62.5 MCG/INH inhaler, Inhale 1 puff into the lungs daily, Disp: 1 Inhaler, Rfl: 0  metFORMIN (GLUCOPHAGE-XR) 500 MG 24 hr tablet, take 4 tablets by mouth once daily with BREAKFAST, Disp: 360 tablet, Rfl: 3  atorvastatin (LIPITOR) 20 MG tablet, take 1 tablet by mouth once daily, Disp: 90 tablet, Rfl: 3  Misc. Devices (BATH/SHOWER SEAT) MISC Device, 1 Device by Other route daily, Disp: 1 each, Rfl: 0  albuterol (PROVENTIL) (2.5 MG/3ML) 0.083% nebulizer solution, Take 3 mLs by nebulization every 6 (six) hours as needed for Wheezing Dx: Code: J44.9, Disp: 75 mL, Rfl: 1  FLUoxetine (PROZAC) 20 MG capsule, Take 1 capsule by mouth daily (Patient not taking: Reported on 03/09/2020     ), Disp: 30 capsule, Rfl: 2  cholecalciferol (VITAMIN D3) 1000 UNIT tablet, Take 1 tablet by mouth daily, Disp: , Rfl:   pregabalin (LYRICA) 100 MG capsule, Take 100 mg by mouth as needed With Dr Dwyane Dee     , Disp: , Rfl:   oxycodone-acetaminophen (PERCOCET)  5-325 MG per tablet, Take 1 tablet by mouth 2 (two) times daily as needed With Dr Dwyane Dee, Disp: , Rfl:   aspirin 81 MG tablet, Take 81 mg by mouth daily., Disp: , Rfl:     No current facility-administered medications on file prior to visit.      Review of Patient's Allergies indicates:   Codeine camsylate       Shortness of Breath, Other (See                            Comments)    Comment:Chest pain   Motrin [ibuprofen]      Nausea Only   Sulfa antibiotics       Other (See Comments)   Bactrim                 Rash, Itching    FAMILY HISTORY: Brother had CABG in his 2s. Mother died at 79 from heart failure.     SOCIAL HISTORY: Tobacco <1/2 ppd. Quit in 2018 for 8 months the  resumed smoking. Caffeine none. EtOH - none. No heroin/cocaine. 55 yo granddaughter lives with her.     ROS: All other systems were pertinently reviewed and were negative unless otherwise outlined above.    PHYSICAL EXAM:  General: Obese; normal appearance for age; no apparent distress   04/08/20  1053   BP: 100/68   Pulse: 92   Temp: 98.1 F (36.7 C)   TempSrc: Temporal   SpO2: 97%   Weight: 99.8 kg (220 lb)     HEENT: Oral mucosa is moist, no icterus, no pallor noted.  NECK: Supple. No thyromegaly.  CHEST: Clear to auscultation, no crackles or rhonchi. No wheezes.  HEART: Normal JVP. Carotid pulse are +2 bilaterally with normal upstrokes and no bruits. Regular rate and rhythm. No murmurs, rubs, or gallops.   ABDOMEN: Soft, no organomegaly detected. No abdominal bruits.  EXTREMITIES: Warm and well perfused. Peripheral pulses are +2 bilaterally in both upper and lower extremities. There is no evidence of peripheral pedal edema.    PSYCHIATRIC: Mood and affect are appropriate.  SKIN: No rash observed.    PERTINENT INVESTIGATIONS:  1. EKG 01/25/2020 (on my personal review): Sinus rhythm at 100 bpm. Baseline incomplete RBBB with intermittent RBBB aberrancy. No acute ST-T changes.  2. LABS:   Lab Results   Component Value Date    NA 141 01/25/2020    K 4.8 01/25/2020    CL 104 01/25/2020    CO2 28 01/25/2020    BUN 18 01/25/2020    CREAT 0.8 01/25/2020    GLUCOSER 95 01/25/2020     Lab Results   Component Value Date    LDL 70 11/05/2018    HDL 46 11/05/2018    TG 229 (H) 11/05/2018     NT-proBNP (pg/mL)   Date Value   05/09/2018 114     ECHO 03/01/2020:  1. Left Ventricle: Sigmoid shaped septum with focal hypertrophy of the basal septum. The remaining wall thickness is mildly thickened.   2. Left Ventricle: Global systolic function: EF is estimated visually at 65%.   3. Left Ventricle: Regional systolic function: Wall motion: There are no regional wall motion abnormalities.   4. Left Ventricle: There is grade I diastolic  dysfunction (impaired relaxation).   5. Tricuspid Valve: There is insufficient regurgitation to estimate RVSP.   6. As compared to the previous echocardiogram on 04/29/18, the LV wall thickness is  now mildly thickened.    30-Day Event Monitor March/April 2021:  1 run of 4 beats nonsustained VT with a heart rate of 218 bpm  and 1 run of 17 beats  paroxysmal SVT (likely atrial tachycardia) with heart rate of 190 bpm.  Both the atrial and ventricular run were asymptomatic (patient did not activated device to indicate symptoms at the time of the arrhythmia).  There were 2 patient- triggered events with symptoms of shortness of breath and heart racing.  Both showed sinus tachycardia with atrial couplet and single PVC present respectively.  Otherwise, unremarkable tracings.  Average heart rate was 92 bpm, ranging from 50-218 bpm ( while in NSVT).  Very few isolated PVCs and PACs.  Nighttime first-degree AV block noted due to high vagal tone-clinically not significant.    ASSESSMENT:  64 year old woman with obesity, COPD, DM 2, tobacco use, OSA on CPAP (although currently without; needs new mask and tubing) who has a h/o non-specified tachycardia treated with low dose Toprol XL which she believes was started after a stress echo done in April 2013.    Event monitor March/April 2021 showed non-sustained PSVT lasting 3-17 beats with rates up to 190 bpm. She also had a 4-beat run of NSVT. Echo showed normal LVEF. Dr. [name] ordered a nuclear stress test which is scheduled for 04/26/2020. I advised her to increase Toprol XL to 50 mg qAM and 25 mg qPM. Since her BP is low normal, I asked her to decrease the Lisinopril to 5 mg daily. Will check Holter in ~1 month.    PLAN:    Orders Placed This Encounter      metoprolol (TOPROL-XL) 25 MG 24 hr tablet      lisinopril (ZESTRIL) 5 MG tablet    (I47.1) SVT (supraventricular tachycardia) (HCC)  (primary encounter diagnosis)  Comment: Non-sustained PSVT 3-17 beats on Event Monitor  March/April 2021.  Plan:   1. Increase Toprol XL to 50 mg qAM and 25 mg qPM.  2. Bc of low normal BPs, will decrease Lisinopril to 5 mg daily in the evening.  3. Check HOLTER MONITORING in ~1 month.    (I47.2) NSVT (nonsustained ventricular tachycardia) (HCC)  Comment: One 4-beat run on NSVT (asymptomatic).  Plan:   1. Nuclear stress test was ordered by Dr. [name] and is scheduled for 04/26/2020.  2. Recheck HOLTER MONITORING in ~1 month on the higher Toprol XL dose.    (G47.33) OSA (obstructive sleep apnea)  Comment: Currently not using it bc she needs new tubing and new mask.  Plan:   1. I emailed [facility] to have them aid patient in getting re-established with CPAP.  2. Portion control and exercise to facilitate weight loss.    (E11.29,  R80.9) Type 2 diabetes mellitus with microalbuminuria, without long-term current use of insulin (HCC)  Comment: Lipids near ideal levels in 10/2018.  Plan:   1. Continue aspirin 81 mg daily.  2. Continue Atorvastatin 20 mg daily.    (R80.9) Microalbuminuria  Plan:   1. Continue Lisinopril but will decrease dose as above given low normal BPs and plan to increase Toprol XL.    (F17.200) Tobacco use disorder  Comment: Smoking <1/2 ppd.  Plan:   1. Smoking cessation.      Return for follow up in Cardiology Clinic in 3 months. To contact earlier if there are any cardiac related issues.  I have spent 40 minutes in face to face time with this patient of which more  than 50% was spent in counseling and/or coordination of care regarding above issues and diagnoses.    This Cardiology Division patient encounter note was created using voice-recognition software and in real time during the clinic visit. Please excuse any typographical errors that have not been edited out.     Electronically signed by: Jerrell Mylar, MD, 04/09/2020 11:28 PM

## 2020-04-09 ENCOUNTER — Encounter (HOSPITAL_BASED_OUTPATIENT_CLINIC_OR_DEPARTMENT_OTHER): Payer: Self-pay | Admitting: Clinical Cardiac Electrophysiology

## 2020-04-10 ENCOUNTER — Encounter (HOSPITAL_BASED_OUTPATIENT_CLINIC_OR_DEPARTMENT_OTHER): Payer: Self-pay | Admitting: Clinical Cardiac Electrophysiology

## 2020-04-11 DIAGNOSIS — M2012 Hallux valgus (acquired), left foot: Secondary | ICD-10-CM | POA: Diagnosis not present

## 2020-04-11 DIAGNOSIS — M65872 Other synovitis and tenosynovitis, left ankle and foot: Secondary | ICD-10-CM | POA: Diagnosis not present

## 2020-04-11 DIAGNOSIS — M779 Enthesopathy, unspecified: Secondary | ICD-10-CM | POA: Diagnosis not present

## 2020-04-12 ENCOUNTER — Encounter (HOSPITAL_BASED_OUTPATIENT_CLINIC_OR_DEPARTMENT_OTHER): Payer: Self-pay

## 2020-04-12 NOTE — Progress Notes (Signed)
Pt. Is scheduled for Mask clinic 04/27/20 @ 13:30pm    She has a CPAP machine but she says she needs new tubing and a new mask. Not sure if Dr. [name] needs to place a new referral for insurance purposes.

## 2020-04-13 ENCOUNTER — Ambulatory Visit: Payer: Medicare Other | Attending: Internal Medicine | Admitting: Optometrist

## 2020-04-13 DIAGNOSIS — H52203 Unspecified astigmatism, bilateral: Secondary | ICD-10-CM | POA: Diagnosis not present

## 2020-04-13 DIAGNOSIS — H524 Presbyopia: Secondary | ICD-10-CM | POA: Diagnosis not present

## 2020-04-13 DIAGNOSIS — Z961 Presence of intraocular lens: Secondary | ICD-10-CM | POA: Diagnosis not present

## 2020-04-13 DIAGNOSIS — H5213 Myopia, bilateral: Secondary | ICD-10-CM | POA: Diagnosis not present

## 2020-04-13 DIAGNOSIS — M5417 Radiculopathy, lumbosacral region: Secondary | ICD-10-CM | POA: Diagnosis not present

## 2020-04-13 DIAGNOSIS — M47817 Spondylosis without myelopathy or radiculopathy, lumbosacral region: Secondary | ICD-10-CM | POA: Diagnosis not present

## 2020-04-13 DIAGNOSIS — Z9842 Cataract extraction status, left eye: Secondary | ICD-10-CM | POA: Diagnosis not present

## 2020-04-13 NOTE — Progress Notes (Signed)
Samantha Cameron is doing well one month s/p phacoemulsification with Posterior Chamber Intraocular lens implant of the left eye.  she is instructed to discontinue the pred forte eye drops.    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  Otherwise she will f/u in three-four months for follow up.

## 2020-04-15 ENCOUNTER — Encounter (HOSPITAL_BASED_OUTPATIENT_CLINIC_OR_DEPARTMENT_OTHER): Payer: Self-pay

## 2020-04-15 NOTE — Progress Notes (Unsigned)
Pt called. FB sensation in eyes. Will use Artificial Tears per Dr.Zheng.

## 2020-04-22 ENCOUNTER — Encounter (HOSPITAL_BASED_OUTPATIENT_CLINIC_OR_DEPARTMENT_OTHER): Payer: Self-pay | Admitting: Student in an Organized Health Care Education/Training Program

## 2020-04-22 DIAGNOSIS — Z8679 Personal history of other diseases of the circulatory system: Secondary | ICD-10-CM | POA: Insufficient documentation

## 2020-04-22 DIAGNOSIS — I471 Supraventricular tachycardia, unspecified: Secondary | ICD-10-CM | POA: Insufficient documentation

## 2020-04-26 ENCOUNTER — Other Ambulatory Visit (HOSPITAL_BASED_OUTPATIENT_CLINIC_OR_DEPARTMENT_OTHER): Payer: Medicare Other

## 2020-04-27 ENCOUNTER — Encounter (HOSPITAL_BASED_OUTPATIENT_CLINIC_OR_DEPARTMENT_OTHER): Payer: Self-pay

## 2020-04-27 NOTE — Progress Notes (Unsigned)
NO SHOW @ mask clinic today.

## 2020-05-02 DIAGNOSIS — M7752 Other enthesopathy of left foot: Secondary | ICD-10-CM | POA: Diagnosis not present

## 2020-05-02 DIAGNOSIS — M2012 Hallux valgus (acquired), left foot: Secondary | ICD-10-CM | POA: Diagnosis not present

## 2020-05-02 DIAGNOSIS — M47816 Spondylosis without myelopathy or radiculopathy, lumbar region: Secondary | ICD-10-CM | POA: Diagnosis not present

## 2020-05-02 DIAGNOSIS — M10072 Idiopathic gout, left ankle and foot: Secondary | ICD-10-CM | POA: Diagnosis not present

## 2020-05-03 ENCOUNTER — Other Ambulatory Visit (HOSPITAL_BASED_OUTPATIENT_CLINIC_OR_DEPARTMENT_OTHER): Payer: Medicare Other

## 2020-05-11 ENCOUNTER — Encounter (HOSPITAL_BASED_OUTPATIENT_CLINIC_OR_DEPARTMENT_OTHER): Payer: Self-pay | Admitting: Cardiovascular Disease

## 2020-05-11 ENCOUNTER — Other Ambulatory Visit: Payer: Self-pay

## 2020-05-11 ENCOUNTER — Ambulatory Visit
Admission: RE | Admit: 2020-05-11 | Discharge: 2020-05-11 | Disposition: A | Discharge status: Home / Self Care | Payer: Medicare Other | Attending: Clinical Cardiac Electrophysiology | Admitting: Clinical Cardiac Electrophysiology

## 2020-05-11 DIAGNOSIS — Z79891 Long term (current) use of opiate analgesic: Secondary | ICD-10-CM | POA: Diagnosis not present

## 2020-05-11 DIAGNOSIS — M47817 Spondylosis without myelopathy or radiculopathy, lumbosacral region: Secondary | ICD-10-CM | POA: Diagnosis not present

## 2020-05-11 DIAGNOSIS — I471 Supraventricular tachycardia, unspecified: Secondary | ICD-10-CM

## 2020-05-11 DIAGNOSIS — I4729 Other ventricular tachycardia: Secondary | ICD-10-CM

## 2020-05-11 DIAGNOSIS — I472 Ventricular tachycardia: Secondary | ICD-10-CM | POA: Diagnosis present

## 2020-05-11 DIAGNOSIS — M5416 Radiculopathy, lumbar region: Secondary | ICD-10-CM | POA: Diagnosis not present

## 2020-05-11 NOTE — Progress Notes (Signed)
For this week keep the metoprolol the same 50 in the morning and 25 in the night  1 week from now changed to 25 morning and 25 at night (you can cut the 50 mg tabs in half if you need to)  Pay attention if this makes any difference in the breathing and I will see you at your appointment on July 8    ____________above printed for pt    Seen by me 02/2020 - asked to increase toprol to 75mg  /d  Seen by Dr Luiz Blare 03/2020 - hadn't yet increased toprol. At that visit toprol changed to 50 am and 25 pm and lisinopirl from 10 to 5 - did make those changes  Has appt for cpap equipment 1 week from today  Came for holter today - was sob after walking down hall - seems doe is stable - pt can't say whether increase in toprol affected breathing  No edema  See plan above

## 2020-05-16 DIAGNOSIS — M7752 Other enthesopathy of left foot: Secondary | ICD-10-CM | POA: Diagnosis not present

## 2020-05-16 DIAGNOSIS — M79672 Pain in left foot: Secondary | ICD-10-CM | POA: Diagnosis not present

## 2020-05-16 DIAGNOSIS — M10072 Idiopathic gout, left ankle and foot: Secondary | ICD-10-CM | POA: Diagnosis not present

## 2020-05-18 ENCOUNTER — Encounter (HOSPITAL_BASED_OUTPATIENT_CLINIC_OR_DEPARTMENT_OTHER): Payer: Self-pay

## 2020-05-18 DIAGNOSIS — M47817 Spondylosis without myelopathy or radiculopathy, lumbosacral region: Secondary | ICD-10-CM | POA: Diagnosis not present

## 2020-05-18 DIAGNOSIS — F40231 Fear of injections and transfusions: Secondary | ICD-10-CM | POA: Diagnosis not present

## 2020-05-18 NOTE — Progress Notes (Signed)
NO SHOW@ Mask Clinic today.

## 2020-05-25 ENCOUNTER — Telehealth (HOSPITAL_BASED_OUTPATIENT_CLINIC_OR_DEPARTMENT_OTHER): Payer: Self-pay | Admitting: General Practice

## 2020-05-25 NOTE — Telephone Encounter (Signed)
Call placed to pt. Results of Holter monitoring provided. Upcoming appointment for stress test confirmed with pt.        Jerrell Mylar, MD  P [facility] Nurses Pool; Valli Glance, MD  Dear nursing,   I would let her know that her Holter was OK. Still has some extra beats here and there but nothing sustained or serious - Continue current meds. Thanks. Jonelle Sidle

## 2020-05-26 ENCOUNTER — Ambulatory Visit: Payer: Medicare Other | Attending: Internal Medicine | Admitting: Physician Assistant

## 2020-05-26 ENCOUNTER — Ambulatory Visit (HOSPITAL_BASED_OUTPATIENT_CLINIC_OR_DEPARTMENT_OTHER): Payer: Medicare Other | Admitting: Cardiovascular Disease

## 2020-05-26 ENCOUNTER — Other Ambulatory Visit: Payer: Self-pay

## 2020-05-26 ENCOUNTER — Encounter (HOSPITAL_BASED_OUTPATIENT_CLINIC_OR_DEPARTMENT_OTHER): Payer: Self-pay | Admitting: Physician Assistant

## 2020-05-26 VITALS — BP 97/63 | HR 84 | Temp 95.8°F | Ht 61.61 in | Wt 218.8 lb

## 2020-05-26 DIAGNOSIS — R252 Cramp and spasm: Secondary | ICD-10-CM

## 2020-05-26 DIAGNOSIS — E1129 Type 2 diabetes mellitus with other diabetic kidney complication: Secondary | ICD-10-CM

## 2020-05-26 DIAGNOSIS — Z7189 Other specified counseling: Secondary | ICD-10-CM | POA: Diagnosis present

## 2020-05-26 DIAGNOSIS — R809 Proteinuria, unspecified: Secondary | ICD-10-CM | POA: Diagnosis not present

## 2020-05-26 LAB — CBC WITH PLATELET
ABSOLUTE NRBC COUNT: 0 10*3/uL (ref 0.0–0.0)
HEMATOCRIT: 45.7 % — ABNORMAL HIGH (ref 34.1–44.9)
HEMOGLOBIN: 14.3 g/dL (ref 11.2–15.7)
MEAN CORP HGB CONC: 31.3 g/dL (ref 31.0–37.0)
MEAN CORPUSCULAR HGB: 29.7 pg (ref 26.0–34.0)
MEAN CORPUSCULAR VOL: 94.8 fl (ref 80.0–100.0)
MEAN PLATELET VOLUME: 10.6 fL (ref 8.7–12.5)
NRBC %: 0 % (ref 0.0–0.0)
PLATELET COUNT: 337 10*3/uL (ref 150–400)
RBC DISTRIBUTION WIDTH STD DEV: 52 fL — ABNORMAL HIGH (ref 35.1–46.3)
RED BLOOD CELL COUNT: 4.82 M/uL (ref 3.90–5.20)
WHITE BLOOD CELL COUNT: 14.5 10*3/uL — ABNORMAL HIGH (ref 4.0–11.0)

## 2020-05-26 LAB — BASIC METABOLIC PANEL
ANION GAP: 5 mmol/L (ref 5–15)
BUN (UREA NITROGEN): 17 mg/dL (ref 7–18)
CALCIUM: 9.9 mg/dL (ref 8.5–10.1)
CARBON DIOXIDE: 27 mmol/L (ref 21–32)
CHLORIDE: 104 mmol/L (ref 98–107)
CREATININE: 0.6 mg/dL (ref 0.4–1.2)
ESTIMATED GLOMERULAR FILT RATE: >60 mL/min (ref >60)
Glucose Random: 88 mg/dL (ref 74–160)
POTASSIUM: 4.7 mmol/L (ref 3.5–5.1)
SODIUM: 136 mmol/L (ref 136–145)

## 2020-05-26 LAB — HIGH DENSITY LIPOPROTEIN: HIGH DENSITY LIPOPROTEIN: 61 mg/dL (ref 40–?)

## 2020-05-26 LAB — CHOLESTEROL: Cholesterol: 175 mg/dL (ref 0–239)

## 2020-05-26 LAB — LOW DENSITY LIPOPROTEIN DIRECT: LOW DENSITY LIPOPROTEIN DIRECT: 82 mg/dL (ref 0–189)

## 2020-05-26 LAB — THYROID SCREEN TSH REFLEX FT4: THYROID SCREEN TSH REFLEX FT4: 2.04 u[IU]/mL (ref 0.358–3.740)

## 2020-05-26 LAB — MAGNESIUM: MAGNESIUM: 2 mg/dL (ref 1.8–2.4)

## 2020-05-26 LAB — IRON: IRON: 85 ug/dL (ref 50–170)

## 2020-05-26 LAB — FERRITIN: FERRITIN: 26 ng/mL (ref 8–252)

## 2020-05-26 MED ORDER — CYCLOBENZAPRINE HCL 5 MG PO TABS
5.0000 mg | ORAL_TABLET | Freq: Three times a day (TID) | ORAL | 0 refills | Status: DC | PRN
Start: 2020-05-26 — End: 2022-03-21

## 2020-05-26 NOTE — Progress Notes (Signed)
Samantha Cameron is a 64 year old female patient of Dr. [name] who presents today with charlie horse     Same day visit     left leg cramping x 4 days   Pain started while sleeping, wakes her up at night   Feels like a cramp or spasm   Tends to get muscle cramps but this feels more intense   Cramp lasts for several seconds and foot will cramp up  Cramp resolves but then still in a lot of pain   Has had some cramping in right leg   But left leg gets cramps from calf down   Is drinking water - generally has 3 full dunkin cups of water   Pain radiates to bottom of foot   Some topicals have eased it, biofreeze, rubbing alcohol   No discoloration   No redness       Additional ROS:  All others reviewed and negative.    PMH, SH and FM reviewed     Physical exam:  BP 97/63    Pulse 84    Temp 95.8 F (35.4 C)    Ht 5' 1.61" (1.565 m)    Wt 99.2 kg (218 lb 12.8 oz)    LMP 10/22/2007 (LMP Unknown)    SpO2 97%    BMI 40.52 kg/m     Constitutional: Not in acute distress.  HEENT: Normal conjunctiva and intact EOM. PERRLA.   Cardiovascular: Regular rate, rhythm, normal S1/ S2 heart sounds.  Pulmonary/Chest: Effort normal and breath sounds normal. No respiratory distress. .  Musculoskeletal: Bilateral LE: bilateral calve without swelling, erythema, warmth or discoloration, mild tenderness bilaterally L>R, full ROM, 5/5 strength bilaterally, sensation grossly intact.   Neurological:  Alert and oriented.    Psychiatric: Normal mood and affect.   Skin: Skin is dry and intact.    Assessment and Plan:  (R25.2) Muscle cramping  Comment: history most suggestive of muscle spasm, previously with bilateral symptoms but right leg pain has resolved and now with ongoing pain in left leg. Hx of leg spasms in the past. No redness, swelling, discoloration or signs to suggest DVT. Will check electrolytes and trial muscle relaxant to help with spasm.   Plan: cyclobenzaprine (FLEXERIL) 5 MG tablet, BASIC         METABOLIC PANEL, MAGNESIUM, THYROID  SCREEN TSH         REFLEX FT4, CBC WITH PLATELET, IRON, FERRITIN  Increase fluid intake   Discussed side effects of flexeril including drowsiness and fatigue, advised patient to trial first at night then can use TID PRN, avoid driving and operating machinery if patient experiences SE.  Patient was educated/ counseled on warning signs and to seek immediate care in those scenarios by calling 911 or going to nearest Emergency Room      (E11.29,  R80.9) Type 2 diabetes mellitus with microalbuminuria, without long-term current use of insulin ([facility])  Comment: due for a1c, DM generally well controlled with last a1c of 6.1% in November 2020, since collecting labs today a1c and lipids added on.   Plan: HEMOGLOBIN A1C, LAB ADD ON/WRITE IN TEST    (Z71.89) Advanced care planning/counseling discussion  Plan: HEALTH CARE PROXY      We discussed the patients current medications. The patient expressed understanding and no barriers to adherence were identified.   1. The patient indicates understanding of these issues and agrees with the plan. Brief care plan is updated and reviewed with the patient.   2. The  patient is given an After Visit Summary sheet that lists all medications with directions, allergies, orders placed during this encounter, and follow-up instructions.   3. I reviewed the patient's medical information and medical history.   4. I reconciled the patient's medication list and prepared and supplied needed refills.   5. I have reviewed the past medical, family, and social history sections including the medications and allergies    Verlene Mayer, PA-C  [facility]

## 2020-05-27 LAB — HEMOGLOBIN A1C
ESTIMATED AVERAGE GLUCOSE: 128 (ref 74–160)
HEMOGLOBIN A1C: 6.1 % — ABNORMAL HIGH (ref 4.0–5.6)

## 2020-06-06 ENCOUNTER — Ambulatory Visit (HOSPITAL_BASED_OUTPATIENT_CLINIC_OR_DEPARTMENT_OTHER)
Admission: RE | Admit: 2020-06-06 | Discharge: 2020-06-06 | Disposition: A | Discharge status: Home / Self Care | Payer: Medicare Other | Source: Ambulatory Visit

## 2020-06-06 ENCOUNTER — Other Ambulatory Visit: Payer: Self-pay

## 2020-06-06 ENCOUNTER — Other Ambulatory Visit (HOSPITAL_BASED_OUTPATIENT_CLINIC_OR_DEPARTMENT_OTHER): Payer: Self-pay | Admitting: Cardiovascular Disease

## 2020-06-06 ENCOUNTER — Ambulatory Visit
Admission: RE | Admit: 2020-06-06 | Discharge: 2020-06-06 | Disposition: A | Discharge status: Home / Self Care | Payer: Medicare Other | Attending: Cardiovascular Disease | Admitting: Cardiovascular Disease

## 2020-06-06 DIAGNOSIS — R0600 Dyspnea, unspecified: Secondary | ICD-10-CM

## 2020-06-06 DIAGNOSIS — I472 Ventricular tachycardia: Secondary | ICD-10-CM

## 2020-06-06 DIAGNOSIS — R0609 Other forms of dyspnea: Secondary | ICD-10-CM

## 2020-06-06 DIAGNOSIS — I259 Chronic ischemic heart disease, unspecified: Secondary | ICD-10-CM | POA: Diagnosis present

## 2020-06-06 DIAGNOSIS — I4729 Other ventricular tachycardia: Secondary | ICD-10-CM

## 2020-06-06 MED ORDER — TECHNETIUM TC-99M SESTAMIBI (CARDIOLITE) INJECTION
9.00 | Freq: Once | INTRAVENOUS | Status: AC
Start: 2020-06-06 — End: 2020-06-06
  Administered 2020-06-06: 11 via INTRAVENOUS

## 2020-06-06 MED ORDER — REGADENOSON 0.4 MG/5ML IV SOLN
0.40 mg | Freq: Once | INTRAVENOUS | Status: AC
Start: 2020-06-06 — End: 2020-06-06
  Administered 2020-06-06: 0.4 mg via INTRAVENOUS
  Filled 2020-06-06: qty 5

## 2020-06-06 MED ORDER — AMINOPHYLLINE 25 MG/ML IV SOLN
50.0000 mg | INTRAVENOUS | Status: DC | PRN
Start: 2020-06-06 — End: 2020-06-07
  Administered 2020-06-06: 25 mg via INTRAVENOUS
  Filled 2020-06-06: qty 10

## 2020-06-06 MED ORDER — TECHNETIUM TC-99M SESTAMIBI (CARDIOLITE) INJECTION
9.00 | Freq: Once | INTRAVENOUS | Status: AC
Start: 2020-06-06 — End: 2020-06-06
  Administered 2020-06-06: 27.5 via INTRAVENOUS

## 2020-06-06 NOTE — Progress Notes (Signed)
Pt. Arrived to the stress lab.   Test explained and consent obtained.   Brief history taken with no active chest pain.  No history of asthma and lung sounds clear upon assessment.   Vitals WNL for patient HR 88 BP 100/62 spO2 95%.    intravenous Regadenoson (lexiscan) was injected, 419mcg over 10 seconds.   No chest pain throughout test.   Completed test without complications.   Lexiscan was reversed with 25 mg of Aminophylline for shortness of breath and abdominal discomfort that resolved over the next 5 minutes of recovery.   Pt. Was observed for 5 minutes and left the stress lab in stable condition.   Please see Stress Report in Cardiopulmonary tab.   Hessie Knows, RN, 06/06/2020

## 2020-06-07 NOTE — Progress Notes (Signed)
CARDIOLOGY CLINIC CONSULT NOTE -  Date of visit: 06/09/2020  Language: English    History of present illness:   64 year old female with COPD, DM2, tobacco use, OSA on cpap, overweight/obesity, tachycardia, PACS with aberrant right bundle conduction    See 02/19/20 note for more detail    Saw PCP 01/25/2020-sent to ER for concerns of tachycardia. Sent to cards for f/u. sxs of heart racing x yrs and been on metoprolol long term.      Televisit 02/12/2020------Increased metoprolol to 50 mg from 25, decrease lisinopril from 20 to 10. Stop omega-3  Office 02/19/20 -no change w increased toprol. sxs DOE and heart racing with more than baseline exertion such as walking from the elevator down to clinic today.  Seems to recover fairly quickly----metoprolol to 75 mg in one wk    Seen by Dr Luiz Blare 03/2020 - hadn't yet increased toprol. At that visit toprol changed to 50 am and 25 pm and lisinopirl from 10 to 5 - did make those changes. Plan was holter on increased toprol and f/u w stress test    Saw pt when came for holter 05/11/20 - was sob after walking down hall - seems doe is stable - pt can't say whether increase in toprol affected breathing  Has appt for cpap equipment 1 week from today  --asked her to change toprol to 25 morning and 25 at night (you can cut the 50 mg tabs in half if you need to)    televisit 06/09/20  We have been adjusting metoprolol for symptoms of heart racing, PACs, 1 four beat run of nsvt on event monitor and working up [place].    Had stress 06/06/20 w mild anterior ischemia  yest had appt w back doctor - bp was ok and   Thinks breathing is a little better on lower dose toprol 25 bid  Called to reschedule cpap appt  Denies pnd since metoprolol changed  No edema, syncope  Reported an episode of central cp x 1 that resolved on its own    Most Recent BP Reading(s)  06/06/20 : 100/62  05/26/20 : 97/63  04/08/20 : 100/68  03/09/20 : 128/87  02/29/20 : 109/60    Most Recent Weight Reading(s)  05/26/20 : 99.2 kg (218  lb 12.8 oz)  04/08/20 : 99.8 kg (220 lb)  03/09/20 : 100.6 kg (221 lb 12.8 oz)  03/03/20 : 99.8 kg (220 lb)  02/19/20 : 100.7 kg (222 lb)    Past Medical History:  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) ([place])     Tobacco use disorder     Spinal stenosis of lumbar region     HLD (hyperlipidemia)     Chronic bilateral low back pain     Pineal gland cyst     History of vitamin D deficiency     Internal hemorrhoids     Venous (peripheral) insufficiency     Adrenal adenoma     Type 2 diabetes mellitus with microalbuminuria, without long-term current use of insulin (HCC)     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 (HCC)     Acute pain of right wrist     Abnormal PFTs     Screening for malignant neoplasm of breast     Skin irritation     H/O bone density study     Vertigo     Orthostatic hypotension     Social  problem     Right arm weakness     Insomnia     Acute pain of left knee     Hx of total knee replacement, left     Acute bacterial sinusitis     Seasonal allergic rhinitis     Left foot pain     Stress due to family tension     Leucocytosis     Viral URI     Chronic gastritis     Common bile duct dilation     Fatty liver     Pancreatic lesion     Vascular calcification     Umbilical hernia without obstruction and without gangrene     Arthritis of spine     Scoliosis of thoracic spine     Anxiety     Sleep disturbance     Reflux esophagitis     First degree AV block     Obstructive sleep apnea     Unintentional weight loss     Abdominal pain     Fibroid     Endometrial thickening on ultrasound     ASCUS of cervix with negative high risk HPV     Endometrial polyp     Panic disorder     Chest congestion     Adenomatous polyp of colon     Microalbuminuria     BMI 39.0-39.9,adult     Temporomandibular joint disorder     Sensorineural hearing loss (SNHL) of both ears     Cortical senile cataract, left     PAC (premature atrial contraction)      Essential hypertension     SVT (supraventricular tachycardia) (HCC)      Allergies:  Review of Patient's Allergies indicates:   Codeine camsylate       Shortness of Breath, Other (See                            Comments)    Comment:Chest pain   Motrin [ibuprofen]      Nausea Only   Sulfa antibiotics       Other (See Comments)   Bactrim                 Rash, Itching    Meds: not taking fluoxetine.   Current Outpatient Medications   Medication Instructions   . albuterol (PROVENTIL) 2.5 mg, Nebulization, EVERY 6 HOURS PRN, Dx: Code: J44.9   . aspirin 81 mg, DAILY   . atorvastatin (LIPITOR) 20 MG tablet take 1 tablet by mouth once daily   . cholecalciferol (VITAMIN D3) 1000 UNIT tablet 1 tablet, Oral, DAILY   . cyclobenzaprine (FLEXERIL) 5 mg, Oral, 3 TIMES DAILY PRN   . FLUoxetine (PROZAC) 20 mg, Oral, DAILY   . lisinopril (ZESTRIL) 5 mg, Oral, DAILY WITH DINNER   . metFORMIN (GLUCOPHAGE-XR) 500 MG 24 hr tablet take 4 tablets by mouth once daily with BREAKFAST   . metoprolol (TOPROL-XL) 25 MG 24 hr tablet Take 2 tablets (=50 mg) orally in the morning and 1 tablet (=25 mg) at night   . Misc. Devices (BATH/SHOWER SEAT) MISC Device 1 Device, Other, DAILY   . oxycodone-acetaminophen (PERCOCET) 5-325 MG per tablet 1 tablet, Oral, 2 TIMES DAILY PRN, With Dr Dwyane Dee   . pregabalin (LYRICA) 100 mg, Oral, PRN, With Dr Dwyane Dee   . umeclidinium (INCRUSE ELLIPTA) 62.5 MCG/INH inhaler 1 puff, Inhalation, DAILY      Social History: tob <  1/2 ppd. Quit 3 yrs ago for 8 months. Caffeine none. etoh - none. No heroin/cocaine. 81 yo granddaughter lives with her.     Family History: brother - cabg in 61s. M - HF died at 60. F died 02/15/2014.     ROS: All other systems reviewed were pertinently negative apart from the history of present illness.    PHYSICAL EXAM:  There were no vitals filed for this visit.  There is no height or weight on file to calculate BMI.     DATA:  LABS:   Lab Results   Component Value Date    NA 136 05/26/2020    K 4.7  05/26/2020    CL 104 05/26/2020    CO2 27 05/26/2020    BUN 17 05/26/2020    CREAT 0.6 05/26/2020    GLUCOSER 88 05/26/2020    LDL 82 05/26/2020    HDL 61 05/26/2020    TG 229 (H) 11/05/2018    ALT 32 07/30/2018    AST 21 07/30/2018     NT-proBNP (pg/mL)   Date Value   05/09/2018 114   04/18/2017 102     EKG: (on my personal review) 01/25/2020-17:22:25-sinus tach 101 bpm.  Some beats w aberrant right bundle branch block conduction.  Cannot exclude IMI.  3 EKGs from same day all similar.  On prior EKGs IMI pattern is intermittent suggesting its lead placement related.     Stress echo 4/13 -modified Bruce 6:09.  Heart rate 92% predicted.  No ischemic changes.    Echo 04/29/2018  1. EF 65%.  2. no rwma  3.RVSP 16    4. No valvular abnormalities    Holter 04/29/2018-heart rate 55-160, average 97.  8 PVCs.  170 PACs.  No runs.  Intermittent first-degree AV block during hours of sleep    Event monitor 2020/02/16-  02/06/2020 through 2020/02/16 -sinus and sinus tach with isolated PACs aberrancy versus PVC  3/25 through 4/1 reviewed -tracings similar to prior with sinus rhythm with PAC with aberrancy versus PVC.  On 02/15/2020 there is a 4 beat run of NSVT at a fast rate to 18 bpm.    Echo 03/01/20  1. Sigmoid septum. The remaining wall thickness is mildly thickened.   2.  EF  65%.   3. no regional wall motion abnormalities.   4. grade I diastolic dysfunction (impaired relaxation).   5. insufficient regurgitation to estimate RVSP.   6. compared to echo 04/29/18, LV wall thickness is now mildly thickened.    Holter 05/11/20 - Sinus rhythm. Frequent PAC with one 5 beat atrial run. PVCs comprising 3% of beats without couplets or runs. HR 56-148, average 95 bpm. (Difficult to differentiate whether these are PVCs versus PACs with aberrancy, does seem to be single morphology)    Nuclear stress 06/06/20 -mild partially reversible disal anterior and apical defect.  no ecg changes. Frequent pvcs. EF 74%. -Reviewed strips.  Difficult to  differentiate whether these are PACs with right bundle aberrancy versus PVCs    Assessment and Plan:  DOE/abnl stress  -given risk factors, run of nsvt, ischemia in apical territory rec for cath for definitive info. Discussed w pt and she agrees. Prefers [facility] after knee injection which is early August    Tachycardia, PACS with aberrant right bundle conduction  -Longstanding symptoms of heart racing generally well controlled on low-dose metoprolol.  sxs stable. Hr ok. Given one run nsvt on monitor and cont to have PACs w abberancy (less likely pvcs)  TSH normal 11/20. ech 02/2020 w normal EF.     HTN  -now toprol 25 bid and lisinopirl 5.  Baseline bp at stress 100/62    OSA  -CPAP titration 12/01/2018. Doesn't wear cpap currently  Needs new tubing - needs to reschedule  Last wore a few months   Works when she has proper equipment    DM2  -Controlled per patient.  Per PCP. aic 6.0 11/20 on metformin    Lipids  -LDL 70 in 12/19. LDL 82 in 05/2020 on atorva 20.     Overweight/obesity  -gained a few lbs during covid    tob use  -pt aware cessation is goal. Doing her best      Follow up with Cardiology in person in ~ September. To contact earlier if there are any cardiac related issues.      Thank you for the privilege of involving me in this patient's care.  Please feel free to contact me if you have any questions.  This patient encounter note was created using voice-recognition software and in real time. Please excuse any typographical errors that have not been edited out.     Electronically signed by: Valli Glance, MD

## 2020-06-08 DIAGNOSIS — M5417 Radiculopathy, lumbosacral region: Secondary | ICD-10-CM | POA: Diagnosis not present

## 2020-06-08 DIAGNOSIS — M25562 Pain in left knee: Secondary | ICD-10-CM | POA: Diagnosis not present

## 2020-06-08 DIAGNOSIS — M25561 Pain in right knee: Secondary | ICD-10-CM | POA: Diagnosis not present

## 2020-06-08 DIAGNOSIS — M47817 Spondylosis without myelopathy or radiculopathy, lumbosacral region: Secondary | ICD-10-CM | POA: Diagnosis not present

## 2020-06-09 ENCOUNTER — Telehealth (HOSPITAL_BASED_OUTPATIENT_CLINIC_OR_DEPARTMENT_OTHER): Payer: Self-pay | Admitting: Registered Nurse

## 2020-06-09 ENCOUNTER — Ambulatory Visit: Payer: Medicare Other | Attending: Cardiovascular Disease | Admitting: Cardiovascular Disease

## 2020-06-09 DIAGNOSIS — I491 Atrial premature depolarization: Secondary | ICD-10-CM

## 2020-06-09 DIAGNOSIS — G4733 Obstructive sleep apnea (adult) (pediatric): Secondary | ICD-10-CM

## 2020-06-09 DIAGNOSIS — R9439 Abnormal result of other cardiovascular function study: Secondary | ICD-10-CM | POA: Diagnosis not present

## 2020-06-09 DIAGNOSIS — F17200 Nicotine dependence, unspecified, uncomplicated: Secondary | ICD-10-CM | POA: Diagnosis present

## 2020-06-09 DIAGNOSIS — I472 Ventricular tachycardia: Secondary | ICD-10-CM | POA: Diagnosis present

## 2020-06-09 DIAGNOSIS — E1129 Type 2 diabetes mellitus with other diabetic kidney complication: Secondary | ICD-10-CM | POA: Diagnosis not present

## 2020-06-09 DIAGNOSIS — R0600 Dyspnea, unspecified: Secondary | ICD-10-CM | POA: Diagnosis not present

## 2020-06-09 DIAGNOSIS — R809 Proteinuria, unspecified: Secondary | ICD-10-CM | POA: Diagnosis not present

## 2020-06-09 DIAGNOSIS — R0609 Other forms of dyspnea: Secondary | ICD-10-CM

## 2020-06-09 DIAGNOSIS — I4729 Other ventricular tachycardia: Secondary | ICD-10-CM

## 2020-06-09 NOTE — Telephone Encounter (Signed)
Returned call to [facility] [facility] [employer] [facility], who is calling to verify lisinopril dose. They have 10mg  and 5mg  on file. Dose was decreased to 5mg  per cardiology office visit note on 5/21.    Patient has cardiology appointment today. Pharmacy will call to confirm dose after appointment.

## 2020-06-09 NOTE — Telephone Encounter (Signed)
-----   Message from [place] sent at 06/09/2020  8:44 AM EDT -----  Regarding: Brooks [id number], 64 year old, female    Calls today:  Clinical Questions ([place])    Name of person calling Lauren  Specific nature of request Ander Purpura is calling to ask the correct medication dosage for lisinopril for confirmation. Thank you   Return phone number [phone]  Person calling on behalf of patient: Pharmacy    Patient's language of care: English    Patient does not need an interpreter.    Patient's PCP: Driscilla Grammes, MD

## 2020-06-14 ENCOUNTER — Other Ambulatory Visit (HOSPITAL_BASED_OUTPATIENT_CLINIC_OR_DEPARTMENT_OTHER): Payer: Self-pay

## 2020-06-14 ENCOUNTER — Encounter (HOSPITAL_BASED_OUTPATIENT_CLINIC_OR_DEPARTMENT_OTHER): Payer: Self-pay | Admitting: Student in an Organized Health Care Education/Training Program

## 2020-06-14 ENCOUNTER — Ambulatory Visit (HOSPITAL_BASED_OUTPATIENT_CLINIC_OR_DEPARTMENT_OTHER): Admit: 2020-06-14 | Discharge: 2020-06-14 | Disposition: A | Discharge status: Home / Self Care | Payer: Medicare Other

## 2020-06-14 ENCOUNTER — Other Ambulatory Visit: Payer: Self-pay

## 2020-06-14 ENCOUNTER — Ambulatory Visit
Admission: RE | Admit: 2020-06-14 | Discharge: 2020-06-14 | Disposition: A | Discharge status: Home / Self Care | Payer: Medicare Other

## 2020-06-14 DIAGNOSIS — M25569 Pain in unspecified knee: Secondary | ICD-10-CM

## 2020-06-14 DIAGNOSIS — Z96652 Presence of left artificial knee joint: Secondary | ICD-10-CM

## 2020-06-14 DIAGNOSIS — M25461 Effusion, right knee: Secondary | ICD-10-CM | POA: Diagnosis present

## 2020-06-14 DIAGNOSIS — M1711 Unilateral primary osteoarthritis, right knee: Secondary | ICD-10-CM | POA: Insufficient documentation

## 2020-06-14 DIAGNOSIS — M25562 Pain in left knee: Secondary | ICD-10-CM | POA: Insufficient documentation

## 2020-06-14 DIAGNOSIS — R52 Pain, unspecified: Secondary | ICD-10-CM

## 2020-06-14 DIAGNOSIS — R0609 Other forms of dyspnea: Secondary | ICD-10-CM | POA: Insufficient documentation

## 2020-06-16 ENCOUNTER — Other Ambulatory Visit: Payer: Self-pay

## 2020-06-16 ENCOUNTER — Ambulatory Visit: Payer: Medicare Other | Attending: Internal Medicine | Admitting: Internal Medicine

## 2020-06-16 ENCOUNTER — Ambulatory Visit (HOSPITAL_BASED_OUTPATIENT_CLINIC_OR_DEPARTMENT_OTHER): Payer: Self-pay | Admitting: Registered Nurse

## 2020-06-16 VITALS — BP 119/69 | HR 89 | Temp 98.0°F | Wt 225.0 lb

## 2020-06-16 DIAGNOSIS — J441 Chronic obstructive pulmonary disease with (acute) exacerbation: Secondary | ICD-10-CM

## 2020-06-16 MED ORDER — ALBUTEROL SULFATE HFA 108 (90 BASE) MCG/ACT IN AERS
2.00 | INHALATION_SPRAY | Freq: Once | RESPIRATORY_TRACT | Status: AC
Start: 2020-06-16 — End: 2020-06-17

## 2020-06-16 MED ORDER — PREDNISONE 20 MG PO TABS
20.0000 mg | ORAL_TABLET | Freq: Every day | ORAL | 0 refills | Status: DC
Start: 2020-06-16 — End: 2020-06-16

## 2020-06-16 MED ORDER — AMOXICILLIN-POT CLAVULANATE 875-125 MG PO TABS
1.00 | ORAL_TABLET | Freq: Two times a day (BID) | ORAL | 0 refills | Status: AC
Start: 2020-06-16 — End: 2020-06-21

## 2020-06-16 MED ORDER — PREDNISONE 50 MG PO TABS
50.00 mg | ORAL_TABLET | Freq: Every day | ORAL | 0 refills | Status: AC
Start: 2020-06-16 — End: 2020-06-21

## 2020-06-16 NOTE — Telephone Encounter (Addendum)
Starting Last Thursday  Chest started hurting when breathing  Cough,nasal congestion and headache  Got diabetic cough medicine  Has been using it "is getting it up"  But thinks she may need an abx  COVIDTRIAGE -- INITIAL TRIAGE:    I have confirmed the patient's name and DOB. Yes     Emergency care:    1. Is the patient patient gasping for air or unable to speak? No  2. Does the patient have new severe chest pain? No  3. Is the patient lethargic or does the patient have altered mental status?  No    A) COVID symptoms (criteria for testing):    Does the patient have any of the following:    1. Fever? No  2. New or worsening cough? Yes  3. New or worsening shortness of breath? Yes  4. New myalgias (muscle aches)? No  5. Anosmia (lack of smell/taste)? No  6. New sore throat? Yes  7. New runny nose or congestion? Yes  8. New vomiting or diarrhea? No    Current day of symptoms: 7    If dyspnea, duration of dyspnea: N/A    If fever, duration of fever: N/A    Other symptoms:     C) Need for in-person evaluation:    1. Is the patient experiencing respiratory symptoms (dypsnea, orthopnea, dyspnea on exertion, wheezing), worsening respiratory symptoms from baseline, or worsening of a chronic cough? Yes  2. Has the patient had a fever for > 48 hours? No  3. Is the patient at [place] ? 4 with worsening symptoms? No  4. Does the patient have chest pain (note: new severe chest pain must be referred to ED)? No  5. Is the patient dizzy or lightheaded? No  6. Does the patient have severe sore throat? No  7. Is the patient pregnant? No  8. Is the patient requesting in-person evaluation?  Yes    Disposition:    1. I have provided the patient with Home Care Advice (use [facility]OMEC[facility]). Yes   2. I have ordered testing (for symptomatic patients not requiring in-person evaluation, and asymptomatic contacts). Yes.  **Patients requiring testing only should be scheduled at [facility]. They should be scheduled at [facility] only if unable to get  to [facility].**  3. I have given the patient directions to Drive-Thru Testing (use DRIVETHRUDIRECTIONS) or [facility] (ACCDIRECTIONS), if appropriate. Yes  4. If the patient has an in-person appointment scheduled in the next 14 days (other than [facility]), I have instructed the patient that she should not attend, and have sent a message to the pertinent front desk pool:  N/A.    Final disposition: Other: acc  appt booked.

## 2020-06-16 NOTE — Progress Notes (Signed)
Acute Care Clinic Note    Subjective  Samantha Cameron 64 year old English-speaking female who presents for evaluation in acute care clinic.    HPI  Date of symptom onset:    Date of dyspnea onset:    Coronavirus Vaccine Status: Two doses of Pfizer/Moderna, completed on 02/2020    # URI   Onset 9 days ago   Sxs: nasal congestion, sinus pressure, productive cough. Some dyspnea. Substernal chest pain with coughing. Headache. No fever.    Gets winded after walking 1 block, previously could walk several blocks to the store without difficulty   Started in the setting of using air conditioning more.    Has not been taking albuterol or Incruse.    ROS: some nausea transiently    Pertinent occupation, housing, or social comments: lives with granddaughter. No othe close contacts, no sick contacts.     ROS  No abdominal pain, vomiting, diarrhea. No lower extremity swelling. No loss of smell. No new rashes or skin changes.     Medical risks for COVID complications:  Chronic lung disease - has nebulizer and CPAP machine. On incruse but has not been taking. Long term tobacco use, 20+ pack years    Social needs: No additional needs  If positive, consider warm handoff to nursing and/or [facility] referral    Per chart review prior to visit:  Review of Patient's Allergies indicates:   Codeine camsylate       Shortness of Breath, Other (See                            Comments)    Comment:Chest pain   Motrin [ibuprofen]      Nausea Only   Sulfa antibiotics       Other (See Comments)   Bactrim                 Rash, Itching    Objective  BP 119/69    Pulse 89    Temp 98 F (36.7 C) (Temporal)    Wt 102.1 kg (225 lb)    LMP 10/22/2007 (LMP Unknown)    SpO2 94%    BMI 41.67 kg/m        Most Recent O2 Sat Reading(s)  06/16/20 : 94%  06/06/20 : 95%  05/26/20 : 97%    Gen: no respiratory distress  HEENT:  Nares: patent, frontal and maxillary sinuses tender to percussion  CV: rrr no mrg  Pulm: no rhonchi, coarse wheezing both hemifields, no  bibasilar rales  Skin: good skin turgor, not diaphoretic  Neuro: alert and oriented to time, person and place  Psych: normal affect. Normal thought content, speech, mood are noted.    Assessment  Clinical assessment: 64 yo woman with COPD who presents for 9 days of increased sputum production, dyspnea, and sinus pressure, found to have hypoxemia to 93% with ambulation. Likely COPD exacerbation. Acute sinusitis also possible. COVID possible.     Plan  - test for COVID-19 today  - rx pred 50mg  x 5 days  - rx augmentin 875-125 BID x 5 days  - Symptomatic treatment discussed   - Precautions given, including to call immediately or if new or worsening shortness of breath, and when to seek emergency care or call 911.    Disposition  Dispo to home: Home isolation instructions and symptom precaution printed in AVS and reviewed with patient.   Follow up in 14 day(s) via  pcp; patient to call to schedule    I spent a total of 41 minutes on this visit on the date of service (total time includes all activities performed on the date of service)        Ralene Ok, MD    CC to PCP: Driscilla Grammes, MD

## 2020-06-16 NOTE — Telephone Encounter (Signed)
Regarding: COVID related   ----- Message from [facility] sent at 06/16/2020  9:21 AM EDT -----  Samantha Cameron [id number], 64 year old, female    Calls today:  Sick    What are the symptoms cough/sore throat/ patient looking to get antibiotics   How long has patient been sick? 1 week  What has pt. tried at home n/a  Person calling on behalf of patient: Patient (self)      Patient's language of care: English    Patient does not need an interpreter.    Patient's PCP: Driscilla Grammes, MD

## 2020-06-17 LAB — COVID-19 OUTPATIENT: COVID-19 OUTPATIENT: NEGATIVE

## 2020-06-19 ENCOUNTER — Other Ambulatory Visit (HOSPITAL_BASED_OUTPATIENT_CLINIC_OR_DEPARTMENT_OTHER): Payer: Self-pay | Admitting: Family Medicine

## 2020-06-20 NOTE — Telephone Encounter (Signed)
PER Pharmacy, Samantha Cameron is a 64 year old female has requested a refill of flonase      Last Office Visit: 05/19/2020 with kussmaul  Last Physical Exam: 08/15/2015    There are no preventive care reminders to display for this patient.    Other Med Adult:  Most Recent BP Reading(s)  06/16/20 : 119/69        Cholesterol (mg/dL)   Date Value   05/26/2020 175     LOW DENSITY LIPOPROTEIN DIRECT (mg/dL)   Date Value   05/26/2020 82     HIGH DENSITY LIPOPROTEIN (mg/dL)   Date Value   05/26/2020 61     TRIGLYCERIDES (mg/dL)   Date Value   11/05/2018 229 (H)         THYROID SCREEN TSH REFLEX FT4 (uIU/mL)   Date Value   05/26/2020 2.040         No results found for: TSH    HEMOGLOBIN A1C (%)   Date Value   05/26/2020 6.1 (H)       No results found for: POCA1C      INR (no units)   Date Value   04/18/2017 1.0   05/27/2016 1.0   02/01/2012 < 1.0 (L)       SODIUM (mmol/L)   Date Value   05/26/2020 136       POTASSIUM (mmol/L)   Date Value   05/26/2020 4.7           CREATININE (mg/dL)   Date Value   05/26/2020 0.6       Documented patient preferred pharmacies:    [facility] [place] [place]  Phone: [phone] Fax: [phone]

## 2020-06-22 ENCOUNTER — Other Ambulatory Visit: Payer: Self-pay

## 2020-06-28 DIAGNOSIS — M25561 Pain in right knee: Secondary | ICD-10-CM | POA: Diagnosis not present

## 2020-06-29 ENCOUNTER — Other Ambulatory Visit: Payer: Self-pay

## 2020-06-29 ENCOUNTER — Telehealth (HOSPITAL_BASED_OUTPATIENT_CLINIC_OR_DEPARTMENT_OTHER): Payer: Self-pay | Admitting: Cardiovascular Disease

## 2020-06-29 ENCOUNTER — Ambulatory Visit (HOSPITAL_BASED_OUTPATIENT_CLINIC_OR_DEPARTMENT_OTHER): Payer: Medicare Other | Admitting: General Surgery

## 2020-06-29 ENCOUNTER — Ambulatory Visit (HOSPITAL_BASED_OUTPATIENT_CLINIC_OR_DEPARTMENT_OTHER): Payer: Medicare Other

## 2020-06-29 ENCOUNTER — Ambulatory Visit
Admission: RE | Admit: 2020-06-29 | Discharge: 2020-06-29 | Disposition: A | Discharge status: Home / Self Care | Payer: Medicare Other | Attending: Cardiovascular Disease | Admitting: Cardiovascular Disease

## 2020-06-29 VITALS — BP 99/71 | HR 100 | Temp 97.2°F | Ht 62.0 in | Wt 222.0 lb

## 2020-06-29 DIAGNOSIS — I471 Supraventricular tachycardia, unspecified: Secondary | ICD-10-CM

## 2020-06-29 DIAGNOSIS — Z1231 Encounter for screening mammogram for malignant neoplasm of breast: Secondary | ICD-10-CM

## 2020-06-29 DIAGNOSIS — R809 Proteinuria, unspecified: Secondary | ICD-10-CM

## 2020-06-29 DIAGNOSIS — R0600 Dyspnea, unspecified: Secondary | ICD-10-CM | POA: Diagnosis not present

## 2020-06-29 DIAGNOSIS — Z23 Encounter for immunization: Secondary | ICD-10-CM | POA: Diagnosis not present

## 2020-06-29 DIAGNOSIS — R0609 Other forms of dyspnea: Secondary | ICD-10-CM

## 2020-06-29 DIAGNOSIS — L821 Other seborrheic keratosis: Secondary | ICD-10-CM

## 2020-06-29 DIAGNOSIS — E1129 Type 2 diabetes mellitus with other diabetic kidney complication: Secondary | ICD-10-CM | POA: Diagnosis not present

## 2020-06-29 LAB — CBC WITH PLATELET
ABSOLUTE NRBC COUNT: 0 10*3/uL (ref 0.0–0.0)
HEMATOCRIT: 41.5 % (ref 34.1–44.9)
HEMOGLOBIN: 13.6 g/dL (ref 11.2–15.7)
MEAN CORP HGB CONC: 32.8 g/dL (ref 31.0–37.0)
MEAN CORPUSCULAR HGB: 29.8 pg (ref 26.0–34.0)
MEAN CORPUSCULAR VOL: 91 fl (ref 80.0–100.0)
MEAN PLATELET VOLUME: 10.7 fL (ref 8.7–12.5)
NRBC %: 0 % (ref 0.0–0.0)
PLATELET COUNT: 314 10*3/uL (ref 150–400)
RBC DISTRIBUTION WIDTH STD DEV: 49.4 fL — ABNORMAL HIGH (ref 35.1–46.3)
RED BLOOD CELL COUNT: 4.56 M/uL (ref 3.90–5.20)
WHITE BLOOD CELL COUNT: 13.1 10*3/uL — ABNORMAL HIGH (ref 4.0–11.0)

## 2020-06-29 LAB — BASIC METABOLIC PANEL
ANION GAP: 11 mmol/L (ref 5–15)
BUN (UREA NITROGEN): 11 mg/dL (ref 7–18)
CALCIUM: 9.5 mg/dL (ref 8.5–10.1)
CARBON DIOXIDE: 26 mmol/L (ref 21–32)
CHLORIDE: 104 mmol/L (ref 98–107)
CREATININE: 0.7 mg/dL (ref 0.4–1.2)
ESTIMATED GLOMERULAR FILT RATE: >60 mL/min (ref >60)
Glucose Random: 167 mg/dL — ABNORMAL HIGH (ref 74–160)
POTASSIUM: 4.5 mmol/L (ref 3.5–5.1)
SODIUM: 141 mmol/L (ref 136–145)

## 2020-06-29 LAB — MICROALBUMIN RANDOM URINE
ALB/CREAT RATIO URINE RAN: 230 ug/mg (ref 0–30)
ALBUMIN URINE RANDOM: 71.9 mg/dL — ABNORMAL HIGH (ref 0.0–1.9)
CREATININE RANDOM URINE: 312 mg/dl

## 2020-06-29 NOTE — Telephone Encounter (Signed)
-----   Message from [place] sent at 06/29/2020 10:27 AM EDT -----  Regarding: labs for cath patient on Friday  Hi EB,    [facility] Cath lab called saying your EMS patient scheduled for cath this Friday needs labs drawn beforehand. No orders in chart.Marland KitchenMarland KitchenMarland Kitchen

## 2020-06-29 NOTE — Patient Instructions (Addendum)
MyChart IT support: [phone]      You last A1c = 6.1  Great job!!    Your last HDL and LDL cholesterol were great as well!    Patient Education      Index   Seborrheic Keratosis     What is seborrheic keratosis?  Seborrheic keratosis is a very common skin growth. You are more likely to have this kind of skin growth as you get older. It is not harmful or painful and usually does not need treatment.  What is the cause?  The cause of seborrheic keratosis is not known. It tends to run in families.  What are the symptoms?  These growths usually start as tiny raised, tan, or brown spots, but may be so dark that they look almost black. Sometimes they look like they have been stuck onto the skin. They may be scaly or waxy. The spots tend to get larger and thicker over months and years, and often grow in groups. They may be found on any part of the body, but are most common on the trunk, legs, and arms.  How is it diagnosed?  Your healthcare provider will ask about your symptoms and medical history and examine your skin. You may have a biopsy, which is the removal of a small sample of tissue from the growth for testing, to confirm that the growth is harmless.  How is it treated?  If seborrheic keratosis does not cause problems, it usually does not need treatment. You may want to remove the growth if:   You don't like the way it looks.   It catches on clothing or jewelry and itches or bleeds.   You have a cut or injury very near the growth.  If you don't treat the growths, they will not go away. The growth can be frozen, burned, surgically removed, treated with chemicals, or removed with a laser.  How can I take care of myself?  Contact your healthcare provider about any change in your skin that concerns you. Many skin changes or growths are not cancerous, but some can become cancer. All cancers are easier to treat if found early rather than later, so see your provider to have any skin changes checked as soon as  possible.  Developed by [employer].  Adult Advisor 2019.4 published by [employer].  Last modified: 2017-03-20  Last reviewed: 2017-03-19  This content is reviewed periodically and is subject to change as new health information becomes available. The information is intended to inform and educate and is not a replacement for medical evaluation, advice, diagnosis or treatment by a healthcare professional.  References   Adult Advisor 2019.4 Index     2018 [employer] and/or one of its subsidiaries

## 2020-06-29 NOTE — Progress Notes (Signed)
Subjective:    Samantha Cameron is a 64 year old female with PMH morbid obesity, DMII, COPD, with spinal stenosis of lumbar region and scoliosis of thoracic spine, anxiety, depression, tobacco use DO, HTN presents to check the mole on her back. Her physician who provides monthly spinal injections for pain management noticed a mole and recommended she have it checked.     #back lesion  06/2021 saw her usual physician for injections related to chronic back pain and was told she had a mole that needs to be examined. Not painful, not bleeding.     #DM  Due for diabetic foot exam and urine micro albumin  HEMOGLOBIN A1C (%)   Date Value   05/26/2020 6.1 (H)       No results found for: POCA1C    Glucose Random (mg/dL)   Date Value   06/29/2020 167 (H)     BUN (UREA NITROGEN) (mg/dL)   Date Value   06/29/2020 11     CALCIUM (mg/dL)   Date Value   06/29/2020 9.5     CHLORIDE (mmol/L)   Date Value   06/29/2020 104     CARBON DIOXIDE (mmol/L)   Date Value   06/29/2020 26     ANION GAP (mmol/L)   Date Value   06/29/2020 11     CREATININE (mg/dL)   Date Value   06/29/2020 0.7     POTASSIUM (mmol/L)   Date Value   06/29/2020 4.5     SODIUM (mmol/L)   Date Value   06/29/2020 141     ESTIMATED GLOMERULAR FILT RATE (ML/MIN)   Date Value   06/29/2020 > 60       #mammogram  Due 07/2020, ordered today    Current Outpatient Medications   Medication Instructions    albuterol (PROVENTIL) 2.5 mg, Nebulization, EVERY 6 HOURS PRN, Dx: Code: J44.9    aspirin 81 mg, DAILY    atorvastatin (LIPITOR) 20 MG tablet take 1 tablet by mouth once daily    cholecalciferol (VITAMIN D3) 1000 UNIT tablet 1 tablet, Oral, DAILY    fluticasone (FLONASE) 50 MCG/ACT nasal spray instill 1 spray into each nostril daily    lisinopril (ZESTRIL) 5 mg, Oral, DAILY WITH DINNER    metFORMIN (GLUCOPHAGE-XR) 500 MG 24 hr tablet take 4 tablets by mouth once daily with BREAKFAST    metoprolol (TOPROL-XL) 25 MG 24 hr tablet Take 2 tablets (=50 mg) orally in the  morning and 1 tablet (=25 mg) at night    Misc. Devices (BATH/SHOWER SEAT) MISC Device 1 Device, Other, DAILY    oxycodone-acetaminophen (PERCOCET) 5-325 MG per tablet 1 tablet, Oral, 2 TIMES DAILY PRN, With Dr Dwyane Dee    pregabalin (LYRICA) 100 mg, Oral, PRN, With Dr Dwyane Dee    umeclidinium (INCRUSE ELLIPTA) 62.5 MCG/INH inhaler 1 puff, Inhalation, DAILY        Review of Patient's Allergies indicates:   Codeine camsylate       Shortness of Breath, Other (See                            Comments)    Comment:Chest pain   Motrin [ibuprofen]      Nausea Only   Sulfa antibiotics       Other (See Comments)   Bactrim                 Rash, Itching    Objective:  BP 99/71    Pulse 100    Temp 97.2 F (36.2 C) (Temporal)    Ht 5\' 2"  (1.575 m)    Wt 100.7 kg (222 lb)    LMP 10/22/2007 (LMP Unknown)    SpO2 96%    BMI 40.60 kg/m     Physical Exam  Constitutional:       General: She is not in acute distress.     Appearance: She is not diaphoretic.   HENT:      Head: Normocephalic.   Eyes:      General: No scleral icterus.        Right eye: No discharge.         Left eye: No discharge.      Conjunctiva/sclera: Conjunctivae normal.   Cardiovascular:      Rate and Rhythm: Normal rate and regular rhythm.      Heart sounds: Normal heart sounds.   Pulmonary:      Effort: Pulmonary effort is normal. No respiratory distress.      Breath sounds: Normal breath sounds. No wheezing or rales.   Skin:     General: Skin is warm and dry.      Comments: Numerous, Regular, brown, stuck on appearance skin lesions across back   Neurological:      Mental Status: She is alert and oriented to person, place, and time.      Sensory: Sensation is intact. No sensory deficit.      Gait: Gait is intact.      Comments: Diabetic foot exam normal today   Psychiatric:         Mood and Affect: Mood and affect normal.         Cognition and Memory: Memory normal.         Judgment: Judgment normal.         Assessment/Plan:  Problem List Items Addressed This  Visit        Endocrine and Metabolic    Type 2 diabetes mellitus with microalbuminuria, without long-term current use of insulin (HCC) (Chronic)    Relevant Orders    MICROALBUMIN RANDOM URINE       Genitourinary and Reproductive    Screening for malignant neoplasm of breast    Relevant Orders    [place] SCREENING MAMMO BILATERAL DIGITAL WITH DBT & CAD      Other Visit Diagnoses     Seborrheic keratosis    -  Primary         Lesions appear to be seborrheic keratosis. Reassured patient not cancerous but are a result of sun exposure throughout life. Likely to become more numerous as she ages.     Diabetic foot examination, sensation intact bilaterally  Less on left foot than right, but likely related to sciatica and chronic back pain, not neuropathy secondary to diabetes.     I explained the diagnosis and treatment plan, the patient expressed understanding of the content, and no barriers to adherence were identified. I attempted to answer any questions regarding the diagnosis and the proposed treatment.

## 2020-06-29 NOTE — Telephone Encounter (Signed)
PLEASE let her know  Her labs look ok  pls send labs to [facility] cath lab

## 2020-06-30 NOTE — Telephone Encounter (Signed)
Called patient, made her aware of results.    Called [facility], spoke with [name]. Obtained fax number [phone].    Faxed over patients labs.     Valli Glance, MD 23 hours ago (2:32 PM)        PLEASE let her know  Her labs look ok  pls send labs to [facility] cath lab

## 2020-07-01 DIAGNOSIS — R9439 Abnormal result of other cardiovascular function study: Secondary | ICD-10-CM | POA: Diagnosis not present

## 2020-07-01 DIAGNOSIS — R0600 Dyspnea, unspecified: Secondary | ICD-10-CM | POA: Diagnosis not present

## 2020-07-01 DIAGNOSIS — Z720 Tobacco use: Secondary | ICD-10-CM | POA: Diagnosis not present

## 2020-07-01 DIAGNOSIS — I2510 Atherosclerotic heart disease of native coronary artery without angina pectoris: Secondary | ICD-10-CM | POA: Diagnosis not present

## 2020-07-05 ENCOUNTER — Telehealth (HOSPITAL_BASED_OUTPATIENT_CLINIC_OR_DEPARTMENT_OTHER): Payer: Self-pay | Admitting: Cardiovascular Disease

## 2020-07-05 MED ORDER — TORSEMIDE 10 MG PO TABS
10.0000 mg | ORAL_TABLET | Freq: Every day | ORAL | 1 refills | Status: DC
Start: 2020-07-05 — End: 2020-08-01

## 2020-07-05 NOTE — Telephone Encounter (Signed)
pls let pt know I got the results from her cardiac cath at [facility]. Good new that she doesn't have any significant blockages  Based on the results she may feel better if she takes a fluid pill  I would suggest she tries taking a fluid pill for a week and see if breathing is any better  If she feels better then she can continue taking it (either daily or a few times a week) and we can discuss it further at her appt w me in September    [facility]  (this is in addition to her usual medicines)

## 2020-07-05 NOTE — Telephone Encounter (Signed)
Call placed to pt. Test results provided. Pt states she will pick up her torsemide at [facility] and start tomorrow. She will follow up with Dr. [name] at her appointment in September.        Valli Glance, MD routed conversation to [employer] 1 hour ago (11:21 AM)   Valli Glance, MD 1 hour ago (11:21 AM)        pls let pt know I got the results from her cardiac cath at [facility]. Good new that she doesn't have any significant blockages  Based on the results she may feel better if she takes a fluid pill  I would suggest she tries taking a fluid pill for a week and see if breathing is any better  If she feels better then she can continue taking it (either daily or a few times a week) and we can discuss it further at her appt w me in September    [facility]  (this is in addition to her usual medicines)

## 2020-07-06 DIAGNOSIS — M25561 Pain in right knee: Secondary | ICD-10-CM | POA: Diagnosis not present

## 2020-07-06 DIAGNOSIS — M25562 Pain in left knee: Secondary | ICD-10-CM | POA: Diagnosis not present

## 2020-07-06 DIAGNOSIS — M47817 Spondylosis without myelopathy or radiculopathy, lumbosacral region: Secondary | ICD-10-CM | POA: Diagnosis not present

## 2020-07-06 DIAGNOSIS — Z79891 Long term (current) use of opiate analgesic: Secondary | ICD-10-CM | POA: Diagnosis not present

## 2020-07-12 DIAGNOSIS — M1711 Unilateral primary osteoarthritis, right knee: Secondary | ICD-10-CM | POA: Diagnosis not present

## 2020-07-18 DIAGNOSIS — M79672 Pain in left foot: Secondary | ICD-10-CM | POA: Diagnosis not present

## 2020-07-18 DIAGNOSIS — M779 Enthesopathy, unspecified: Secondary | ICD-10-CM | POA: Diagnosis not present

## 2020-07-18 DIAGNOSIS — M10072 Idiopathic gout, left ankle and foot: Secondary | ICD-10-CM | POA: Diagnosis not present

## 2020-07-19 DIAGNOSIS — M1711 Unilateral primary osteoarthritis, right knee: Secondary | ICD-10-CM | POA: Diagnosis not present

## 2020-07-20 ENCOUNTER — Other Ambulatory Visit (HOSPITAL_BASED_OUTPATIENT_CLINIC_OR_DEPARTMENT_OTHER): Payer: Self-pay | Admitting: Internal Medicine

## 2020-07-20 DIAGNOSIS — Z79891 Long term (current) use of opiate analgesic: Secondary | ICD-10-CM | POA: Diagnosis not present

## 2020-07-20 DIAGNOSIS — M25561 Pain in right knee: Secondary | ICD-10-CM | POA: Diagnosis not present

## 2020-07-20 MED ORDER — GLUCOMETER SYSTEM KIT
PACK | 0 refills | Status: DC
Start: 2020-07-20 — End: 2023-09-30

## 2020-07-20 NOTE — Telephone Encounter (Signed)
PER Patient (self), Samantha Cameron is a 64 year old female has requested a refill of BLOOD GLUCOSE MONITOR    >> GLUCOSE MONITOR STOP WORKING, PLEASE SEND NEW RX TO PHARMACY.      Documented patient preferred pharmacies:    [facility] - [place], [place]  Phone: [phone] Fax: [phone]

## 2020-07-21 ENCOUNTER — Other Ambulatory Visit: Payer: Self-pay

## 2020-07-21 ENCOUNTER — Ambulatory Visit: Payer: Medicare Other | Attending: Ophthalmology | Admitting: Ophthalmology

## 2020-07-21 DIAGNOSIS — H2511 Age-related nuclear cataract, right eye: Secondary | ICD-10-CM | POA: Insufficient documentation

## 2020-07-21 MED ORDER — MOXIFLOXACIN HCL 0.5 % OP SOLN
1.00 [drp] | Freq: Four times a day (QID) | OPHTHALMIC | 0 refills | Status: AC
Start: 2020-07-21 — End: 2020-07-26

## 2020-07-21 NOTE — Progress Notes (Signed)
For evaluation of blurred vision OD and foreign body sensation OS    Impression,    1. Cataract OD-    She can consider CE/IOL if she is bothered by the imbalance between her eyes.    2. Pseudophakia OS-doing well    w/2 loose sutures-removed.    Plan-Vigamox QID x 5 days        RTC if wants cataract surgery OD/6-12 months

## 2020-07-21 NOTE — Progress Notes (Signed)
Samantha Cameron is a 64 year old female.     Here for 3 month F/U - pseudophakia OS 03/09/2020.    Pt reports No ocular c/o's at this time.  Pt sees well, [facility].    No pain, No headache.  No flashes or floaters.

## 2020-07-31 NOTE — Progress Notes (Signed)
CARDIOLOGY CLINIC CONSULT NOTE -  Date of visit: 08/01/2020  Language: English    History of present illness:   64 year old female with COPD, DM2, tobacco use, OSA on cpap, overweight/obesity, tachycardia, PACS with aberrant right bundle conduction. No cad on cardiac cath 06/2020.    See 02/19/20 note for more detail    Saw PCP 01/25/2020-sent to ER for concerns of tachycardia. Sent to cards for f/u. sxs of heart racing x yrs and been on metoprolol long term.      Televisit 02/12/2020------Increased metoprolol to 50 mg from 25, decrease lisinopril from 20 to 10.   Office 02/19/20 -no change w increased toprol. sxs DOE and heart racing with more than baseline exertion such as walking from the elevator down to clinic today.  Seems to recover fairly quickly    Seen by Dr Luiz Blare 03/2020 -  toprol changed to 50 am and 25 pm and lisinopirl from 10 to 5 - did make those changes.     Saw pt when came for holter 05/11/20 --asked her to change toprol to 25 morning and 25 at night   televisit 06/09/20-Thinks breathing is a little better on lower dose toprol 25 bid    06/16/20 - treated for copd exacerbation  Cath 06/2020 - no CAD. LVEDP 20. Trial of torsemide 10    Office 08/01/20  No cp, palps, syncope, pnd  Thinks breathing slightly better w torsemide  No edema, syncope  cpap appt - needs to be done still    Most Recent BP Reading(s)  08/01/20 : 116/61  06/29/20 : 99/71  06/16/20 : 119/69  06/06/20 : 100/62  05/26/20 : 97/63    Most Recent Weight Reading(s)  08/01/20 : 100.7 kg (222 lb)  06/29/20 : 100.7 kg (222 lb)  06/16/20 : 102.1 kg (225 lb)  05/26/20 : 99.2 kg (218 lb 12.8 oz)  04/08/20 : 99.8 kg (220 lb)    Past Medical History:  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) ([place])     Tobacco use disorder     Spinal stenosis of lumbar region     HLD (hyperlipidemia)     Chronic bilateral low back pain     Pineal gland cyst     History of vitamin D deficiency     Internal  hemorrhoids     Venous (peripheral) insufficiency     Adrenal adenoma     Type 2 diabetes mellitus with microalbuminuria, without long-term current use of insulin (HCC)     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 (HCC)     Acute pain of right wrist     Abnormal PFTs     Screening for malignant neoplasm of breast     Skin irritation     H/O bone density study     Vertigo     Orthostatic hypotension     Social problem     Right arm weakness     Insomnia     Acute pain of left knee     Hx of total knee replacement, left     Seasonal allergic rhinitis     Left foot pain     Stress due to family tension     Leucocytosis     Viral URI     Chronic gastritis     Common bile duct dilation     Fatty liver     Pancreatic lesion  Vascular calcification     Umbilical hernia without obstruction and without gangrene     Arthritis of spine     Scoliosis of thoracic spine     Anxiety     Sleep disturbance     Reflux esophagitis     First degree AV block     Obstructive sleep apnea     Unintentional weight loss     Abdominal pain     Fibroid     Endometrial thickening on ultrasound     ASCUS of cervix with negative high risk HPV     Endometrial polyp     Panic disorder     Adenomatous polyp of colon     Microalbuminuria     Temporomandibular joint disorder     Sensorineural hearing loss (SNHL) of both ears     Cortical senile cataract, left     PAC (premature atrial contraction)     Essential hypertension     SVT (supraventricular tachycardia) (HCC)     DOE (dyspnea on exertion)      Allergies:  Review of Patient's Allergies indicates:   Codeine camsylate       Shortness of Breath, Other (See                            Comments)    Comment:Chest pain   Motrin [ibuprofen]      Nausea Only   Sulfa antibiotics       Other (See Comments)   Bactrim                 Rash, Itching    Meds: not taking fluoxetine.   Current Outpatient Medications   Medication Instructions    albuterol (PROVENTIL) 2.5 mg, Nebulization, EVERY 6  HOURS PRN, Dx: Code: J44.9    aspirin 81 mg, DAILY    atorvastatin (LIPITOR) 20 MG tablet take 1 tablet by mouth once daily    Blood Glucose Monitoring Suppl (GLUCOSE MONITORING KIT) monitoring kit Dispense glucometer that is covered by insurance. Dx Code: E11.29    cholecalciferol (VITAMIN D3) 1000 UNIT tablet 1 tablet, Oral, DAILY    fluticasone (FLONASE) 50 MCG/ACT nasal spray instill 1 spray into each nostril daily    lisinopril (ZESTRIL) 5 mg, Oral, DAILY WITH DINNER    metFORMIN (GLUCOPHAGE-XR) 500 MG 24 hr tablet take 4 tablets by mouth once daily with BREAKFAST    metoprolol (TOPROL-XL) 25 MG 24 hr tablet Take 2 tablets (=50 mg) orally in the morning and 1 tablet (=25 mg) at night    Misc. Devices (BATH/SHOWER SEAT) MISC Device 1 Device, Other, DAILY    oxycodone-acetaminophen (PERCOCET) 5-325 MG per tablet 1 tablet, Oral, 2 TIMES DAILY PRN, With Dr Dwyane Dee    pregabalin (LYRICA) 100 mg, Oral, PRN, With Dr Dwyane Dee    torsemide [facility]) 10 mg, Oral, DAILY    umeclidinium (INCRUSE ELLIPTA) 62.5 MCG/INH inhaler 1 puff, Inhalation, DAILY      Social History: tob <1/2 ppd. Quit 3 yrs ago for 8 months. Caffeine none. etoh - none. No heroin/cocaine. 58 yo granddaughter lives with her.     Family History: brother - cabg in 72s. M - HF died at 68. F died December 25, 2013.     ROS: All other systems reviewed were pertinently negative apart from the history of present illness.    PHYSICAL EXAM:   08/01/20  1305   BP: 116/61   Site: Left Arm  Position: Sitting   Cuff Size: Large   Pulse: 102   Temp: 97.2 F (36.2 C)   TempSrc: Temporal   SpO2: 98%   Weight: 100.7 kg (222 lb)     Body mass index is 40.6 kg/m.   General: NAD, well developed, well nourished.   HEENT: Oral mucosa is moist, no icterus, no pallor noted.  NECK: No jugular venous distention, no carotid bruits, 2+ carotid upstrokes.  CHEST: Clear to auscultation, no crackles or rhonchi. Nontender.  HEART: No heaves or lifts. Regular rate and rhythm. No rubs.  No murmur.  ABDOMEN: NABS. Soft. Nontender and nondistended.  EXTREMITIES: Warm and well perfused. No edema. 2+ pulses.  NEURO: A&O X 3, moves all extremities. Grossly nonfocal.  PSYCHIATRIC: Mood and affect are appropriate.    DATA:  LABS:   Lab Results   Component Value Date    NA 141 06/29/2020    K 4.5 06/29/2020    CL 104 06/29/2020    CO2 26 06/29/2020    BUN 11 06/29/2020    CREAT 0.7 06/29/2020    GLUCOSER 167 (H) 06/29/2020    LDL 82 05/26/2020    HDL 61 05/26/2020    TG 229 (H) 11/05/2018    ALT 32 07/30/2018    AST 21 07/30/2018     NT-proBNP (pg/mL)   Date Value   05/09/2018 114   04/18/2017 102     EKG: (on my personal review) 01/25/2020-17:22:25-sinus tach 101 bpm.  Some beats w aberrant right bundle branch block conduction.  Cannot exclude IMI.  3 EKGs from same day all similar.  On prior EKGs IMI pattern is intermittent suggesting its lead placement related.     Stress echo 4/13 -modified Bruce 6:09.  Heart rate 92% predicted.  No ischemic changes.    Echo 04/29/2018  1. EF 65%.  2. no rwma  3.RVSP 16    4. No valvular abnormalities    Holter 04/29/2018-heart rate 55-160, average 97.  8 PVCs.  170 PACs.  No runs.  Intermittent first-degree AV block during hours of sleep    Event monitor 01/2020-  02/06/2020 through 02/10/2020 -sinus and sinus tach with isolated PACs aberrancy versus PVC  3/25 through 4/1 reviewed -tracings similar to prior with sinus rhythm with PAC with aberrancy versus PVC.  On 02/15/2020 there is a 4 beat run of NSVT at a fast rate to 18 bpm.    Echo 03/01/20  1. Sigmoid septum. The remaining wall thickness is mildly thickened.   2.  EF  65%.   3. no regional wall motion abnormalities.   4. grade I diastolic dysfunction  5. insufficient regurgitation to estimate RVSP.   6. compared to echo 04/29/18, LV wall thickness is now mildly thickened.    Holter 05/11/20 - Sinus rhythm. Frequent PAC with one 5 beat atrial run. PVCs comprising 3% of beats without couplets or runs. HR 56-148, average  95 bpm. (Difficult to differentiate whether these are PVCs versus PACs with aberrancy, does seem to be single morphology)    Nuclear stress 06/06/20 -mild partially reversible disal anterior and apical defect.  no ecg changes. Frequent pvcs. EF 74%. -Reviewed strips.  Difficult to differentiate whether these are PACs with right bundle aberrancy versus PVCs    [facility] 07/01/20  LVEDP mildly elevated 20  LM- normal  LAD- normal  D1- normal  LCx/OM1- normal  RCA- minimal disease  PDA/PLB- normal    Assessment and Plan:  DOE  -no cad  on cath. Copd main issue. Also may have other components such as dias hf, tob use, untreated osa    Elevated LVEDP c/w diastolic HF  -torsemide 10. Cont. She feels breathing slightly better.     Tachycardia, PACS with aberrant right bundle conduction  -Longstanding symptoms of heart racing generally well controlled on low-dose metoprolol.  sxs stable. Hr ok. had one 4 beat run nsvt on monitor. None on holter on metoprolol. PACs w abberancy (less likely pvcs). No cad.   TSH normal 11/20. ech 02/2020 w normal EF    HTN  -now toprol 25 bid and lisinopirl 5.  bp stable    OSA  -CPAP titration 12/01/2018. Doesn't wear cpap currently  Needs new tubing - needs to reschedule  Last wore a few months   Works when she has proper equipment    DM2  -Controlled per patient.  Per PCP. aic 6.0 11/20 on metformin. Pt has been on long term w/o bleeding so reasonable to continue    Lipids  -LDL 70 in 12/19. LDL 82 in 05/2020 on atorva 20.     Overweight/obesity  -gained a few lbs during covid    tob use  -pt aware cessation is goal. Doing her best      Follow up with Cardiology in person 6 months . To contact earlier if there are any cardiac related issues.     I spent a total of 30 minutes on this visit on the date of service (total time includes all activities performed on the date of service outside of separately billable procedures such as chart review, time with the patient by phone or in person, documenting  and communicating with other providers)        Thank you for the privilege of involving me in this patient's care.  Please feel free to contact me if you have any questions.  This patient encounter note was created using voice-recognition software and in real time. Please excuse any typographical errors that have not been edited out.     Electronically signed by: Valli Glance, MD

## 2020-08-01 ENCOUNTER — Ambulatory Visit (HOSPITAL_BASED_OUTPATIENT_CLINIC_OR_DEPARTMENT_OTHER): Payer: Medicare Other | Admitting: Cardiovascular Disease

## 2020-08-01 ENCOUNTER — Ambulatory Visit
Admission: RE | Admit: 2020-08-01 | Discharge: 2020-08-01 | Disposition: A | Discharge status: Home / Self Care | Payer: Medicare Other | Attending: Cardiovascular Disease | Admitting: Cardiovascular Disease

## 2020-08-01 ENCOUNTER — Telehealth (HOSPITAL_BASED_OUTPATIENT_CLINIC_OR_DEPARTMENT_OTHER): Payer: Self-pay

## 2020-08-01 ENCOUNTER — Other Ambulatory Visit: Payer: Self-pay

## 2020-08-01 VITALS — BP 116/61 | HR 102 | Temp 97.2°F | Wt 222.0 lb

## 2020-08-01 DIAGNOSIS — E1129 Type 2 diabetes mellitus with other diabetic kidney complication: Secondary | ICD-10-CM

## 2020-08-01 DIAGNOSIS — R809 Proteinuria, unspecified: Secondary | ICD-10-CM | POA: Diagnosis not present

## 2020-08-01 DIAGNOSIS — R0600 Dyspnea, unspecified: Secondary | ICD-10-CM | POA: Insufficient documentation

## 2020-08-01 DIAGNOSIS — R0609 Other forms of dyspnea: Secondary | ICD-10-CM

## 2020-08-01 DIAGNOSIS — I491 Atrial premature depolarization: Secondary | ICD-10-CM

## 2020-08-01 DIAGNOSIS — F17200 Nicotine dependence, unspecified, uncomplicated: Secondary | ICD-10-CM

## 2020-08-01 DIAGNOSIS — I471 Supraventricular tachycardia, unspecified: Secondary | ICD-10-CM

## 2020-08-01 DIAGNOSIS — G4733 Obstructive sleep apnea (adult) (pediatric): Secondary | ICD-10-CM

## 2020-08-01 LAB — BASIC METABOLIC PANEL
ANION GAP: 11 mmol/L (ref 5–15)
BUN (UREA NITROGEN): 11 mg/dL (ref 7–18)
CALCIUM: 9.4 mg/dL (ref 8.5–10.1)
CARBON DIOXIDE: 28 mmol/L (ref 21–32)
CHLORIDE: 105 mmol/L (ref 98–107)
CREATININE: 0.7 mg/dL (ref 0.4–1.2)
ESTIMATED GLOMERULAR FILT RATE: >60 mL/min (ref >60)
Glucose Random: 89 mg/dL (ref 74–160)
POTASSIUM: 4.5 mmol/L (ref 3.5–5.1)
SODIUM: 144 mmol/L (ref 136–145)

## 2020-08-01 LAB — NT-PROBNP: NT-proBNP: 166 pg/mL — ABNORMAL HIGH (ref 0–125)

## 2020-08-01 MED ORDER — METOPROLOL SUCCINATE ER 25 MG PO TB24
25.0000 mg | ORAL_TABLET | Freq: Two times a day (BID) | ORAL | 3 refills | Status: DC
Start: 2020-08-01 — End: 2021-02-02

## 2020-08-01 MED ORDER — TORSEMIDE 10 MG PO TABS
10.0000 mg | ORAL_TABLET | Freq: Every day | ORAL | 3 refills | Status: DC
Start: 2020-08-01 — End: 2021-08-21

## 2020-08-01 NOTE — Telephone Encounter (Signed)
Metoprolol xl should be 25 bid  Please let them know  Thanks

## 2020-08-01 NOTE — Telephone Encounter (Signed)
Call placed to [place].Marland Kitchen Metoprolol dose clarified to take 2 tabs ( 50mg  ) in the am and 1 tab ( 25mg ) at nite.

## 2020-08-01 NOTE — Telephone Encounter (Signed)
-----   Message from [name] sent at 08/01/2020  2:36 PM EDT -----  Regarding: Dr. [name] patient  [place] [id number], 64 year old, female, Telephone Information:  Home Phone      [phone]  Work Phone      [phone]  Mobile          [phone]      Patient's Preferred Pharmacy:     [facility] - [address], [place]  Phone: [phone] Fax: [phone]      CONFIRMED TODAY: [name] NUMBER: [phone]  Best time to call back:  anytime  Cell phone:   Other phone:    Available times:    Patient's language of care: English    Patient does not need an interpreter.    Patient's PCP: Driscilla Grammes, MD    Person calling on behalf of patient: Pharmacy    Calls today to speak to provider only.    Pharmacy calls today. Needs clarification on instructions of medication called " Metoprolol" 25 mg.

## 2020-08-03 ENCOUNTER — Telehealth (HOSPITAL_BASED_OUTPATIENT_CLINIC_OR_DEPARTMENT_OTHER): Payer: Self-pay | Admitting: Cardiovascular Disease

## 2020-08-03 ENCOUNTER — Telehealth (HOSPITAL_BASED_OUTPATIENT_CLINIC_OR_DEPARTMENT_OTHER): Payer: Self-pay | Admitting: Internal Medicine

## 2020-08-03 NOTE — Telephone Encounter (Signed)
PT-1 Request Confirmation  PT-1 request is submitted  PT-1 Request [phone] isAuthorized  An email notification has been sent to the provider on behalf of whom the PT-1 request was submitted. To update the email address, please visit your profile page.    Following are the details of your trip.       Trip Summary       Member Home:  [id number]  Treating Location:  [id number] [place] [id number]   Treatment Details    Treating facility within [facility] locality Yes   Medical treatment type: Z00-Z99 - Factors influencing health status and contact with health services   Duration: 12 Month(s)   Frequency: 6 visit(s) per Month   Transportation Details    Member will require a wheelchair van No   Member will be accompanied by an escort No   Member will require a service animal No

## 2020-08-03 NOTE — Telephone Encounter (Signed)
-----   Message from Evalyn Casco sent at 08/03/2020 11:45 AM EDT -----  Regarding: pt 1  Contact: [phone]  Samantha Cameron [id number], 64 year old, female    Calls today:  Forms  PT1 form   Name of treating facility? [facility]   [address] address, [place], [place], [address] [place], [place]  What are they seeing the specialist for   1 visit(s) per week  4 visit(s) per month  Is wheelchair Lucianne Lei needed? No  Is an escort accompanying the member for assistance with ambulation? No]    Person calling on behalf of patient: Patient (self)    CALL BACK NUMBER: [phone]      Patient's language of care: English    Patient does not need an interpreter.    Patient's PCP: Driscilla Grammes, MD

## 2020-08-03 NOTE — Telephone Encounter (Signed)
pls let her know her labs are good  No changes  thanks

## 2020-08-04 ENCOUNTER — Encounter (HOSPITAL_BASED_OUTPATIENT_CLINIC_OR_DEPARTMENT_OTHER): Payer: Self-pay | Admitting: Student in an Organized Health Care Education/Training Program

## 2020-08-04 DIAGNOSIS — R9430 Abnormal result of cardiovascular function study, unspecified: Secondary | ICD-10-CM | POA: Insufficient documentation

## 2020-08-16 ENCOUNTER — Emergency Department (HOSPITAL_BASED_OUTPATIENT_CLINIC_OR_DEPARTMENT_OTHER): Payer: Medicare Other

## 2020-08-16 ENCOUNTER — Emergency Department
Admission: EM | Admit: 2020-08-16 | Discharge: 2020-08-16 | Disposition: A | Discharge status: Home / Self Care | Payer: Medicare Other | Attending: Emergency Medicine | Admitting: Emergency Medicine

## 2020-08-16 DIAGNOSIS — I10 Essential (primary) hypertension: Secondary | ICD-10-CM | POA: Diagnosis not present

## 2020-08-16 DIAGNOSIS — R079 Chest pain, unspecified: Secondary | ICD-10-CM | POA: Diagnosis present

## 2020-08-16 DIAGNOSIS — R0789 Other chest pain: Secondary | ICD-10-CM | POA: Diagnosis not present

## 2020-08-16 LAB — BASIC METABOLIC PANEL
ANION GAP: 12 mmol/L (ref 5–15)
BUN (UREA NITROGEN): 17 mg/dL (ref 7–18)
CALCIUM: 9.8 mg/dL (ref 8.5–10.1)
CARBON DIOXIDE: 29 mmol/L (ref 21–32)
CHLORIDE: 103 mmol/L (ref 98–107)
CREATININE: 0.8 mg/dL (ref 0.4–1.2)
ESTIMATED GLOMERULAR FILT RATE: >60 mL/min (ref >60)
Glucose Random: 96 mg/dL (ref 74–160)
POTASSIUM: 4.5 mmol/L (ref 3.5–5.1)
SODIUM: 144 mmol/L (ref 136–145)

## 2020-08-16 LAB — CBC, PLATELET & DIFFERENTIAL
ABSOLUTE BASO COUNT: 0.1 10*3/uL (ref 0.0–0.1)
ABSOLUTE EOSINOPHIL COUNT: 0.4 10*3/uL (ref 0.0–0.8)
ABSOLUTE IMM GRAN COUNT: 0.08 10*3/uL — ABNORMAL HIGH (ref 0.00–0.03)
ABSOLUTE LYMPH COUNT: 3.6 10*3/uL (ref 0.6–5.9)
ABSOLUTE MONO COUNT: 1.2 10*3/uL (ref 0.2–1.4)
ABSOLUTE NEUTROPHIL COUNT: 7.7 10*3/uL (ref 1.6–8.3)
ABSOLUTE NRBC COUNT: 0 10*3/uL (ref 0.0–0.0)
BASOPHIL %: 0.5 % (ref 0.0–1.2)
EOSINOPHIL %: 2.9 % (ref 0.0–7.0)
HEMATOCRIT: 41 % (ref 34.1–44.9)
HEMOGLOBIN: 13.8 g/dL (ref 11.2–15.7)
IMMATURE GRANULOCYTE %: 0.6 % — ABNORMAL HIGH (ref 0.0–0.4)
LYMPHOCYTE %: 27.8 % (ref 15.0–54.0)
MEAN CORP HGB CONC: 33.7 g/dL (ref 31.0–37.0)
MEAN CORPUSCULAR HGB: 30.3 pg (ref 26.0–34.0)
MEAN CORPUSCULAR VOL: 90.1 fl (ref 80.0–100.0)
MEAN PLATELET VOLUME: 11.3 fL (ref 8.7–12.5)
MONOCYTE %: 9 % (ref 4.0–13.0)
NEUTROPHIL %: 59.2 % (ref 40.0–75.0)
NRBC %: 0 % (ref 0.0–0.0)
PLATELET COUNT: 294 10*3/uL (ref 150–400)
RBC DISTRIBUTION WIDTH STD DEV: 46.5 fL — ABNORMAL HIGH (ref 35.1–46.3)
RED BLOOD CELL COUNT: 4.55 M/uL (ref 3.90–5.20)
WHITE BLOOD CELL COUNT: 13 10*3/uL — ABNORMAL HIGH (ref 4.0–11.0)

## 2020-08-16 LAB — TROPONIN I: TROPONIN I: <0.02 ng/mL (ref 0.00–0.04)

## 2020-08-16 MED ORDER — ACETAMINOPHEN 500 MG PO TABS
1000.0000 mg | ORAL_TABLET | Freq: Once | ORAL | Status: AC
Start: 2020-08-16 — End: 2020-08-16
  Administered 2020-08-16: 1000 mg via ORAL
  Filled 2020-08-16: qty 2

## 2020-08-16 MED ORDER — LIDOCAINE VISCOUS HCL 2 % MT SOLN
10.0000 mL | Freq: Once | OROMUCOSAL | Status: AC
Start: 2020-08-16 — End: 2020-08-16
  Administered 2020-08-16: 10 mL via OROMUCOSAL
  Filled 2020-08-16: qty 15

## 2020-08-16 MED ORDER — ALUMINUM & MAGNESIUM HYDROXIDE 200-200 MG/5ML PO SUSP
30.0000 mL | Freq: Once | ORAL | Status: AC
Start: 2020-08-16 — End: 2020-08-16
  Administered 2020-08-16: 30 mL via ORAL
  Filled 2020-08-16: qty 30

## 2020-08-16 NOTE — Procedures (Deleted)
Otis Dials, MD - 12/22/2015  Formatting of this note might be different from the original.             KARILYN, WIND       [id number]        Rowe Pavy        [id number]                                          06-20-1956 55      12/20/2011                Clinical History:  PAIN RT FOOT X1 MONTH                                       EXAM#     TYP E/EXAM                       RESULT                        [id number] RAD22/FOOT RIGHT 3 OR MORE VIEW                                              Attending Radiologist: Alanson Puls, MD, [place]         Clinical history:  Right foot pain for 1 month.         Technique:  Three views of the right foot were obtained.  There       are no comparison films.         Findings:  There are minimal degenerative changes in the first       metatarsal phala ngeal joint with narrowing and minimal osteophyte       formation.  There is focal widening of the distal shaft of the       fourth metatarsal consistent with a healed fracture, possibly a       healed stress fracture.  There is a well-corticated plantar       calcaneal spur.         Impression:  Degenerative changes in the first metatarsal       phalangeal joint and healed fracture, possibly stress fracture,       of the fourth metatar sal.                 DD:  12/20/2011 12:55:57 PM         Report ID:  [id number]         Dictated: 12/20/11 1256  PAGE 1               Signed Report                     [name])             BELMIRA, DALEY       [id number]        Rowe Pavy        [id number]                                          1956-10-29 57      12/19/18 13                Clinical History:  PAIN RT FOOT X1 MONTH                                       EXAM#     TYPE/EXAM                       RESULT                        [id number]  RAD22/FOOT RIGHT 3 OR MORE VIEW                                      <Continued>                        ---- Electronic Signature on File ----                      Signed By: Richrd Prime, M.D.                  ------------------------------            Reporte d By: Richrd Prime, M.D.                                                                            CC: Ane Payment D.O.; Ozella Almond PA-C           PAGE 2               Signed Report                               CC: Ane Payment D.O.; Ozella Almond PA-C  PAGE 2               Signed Report                       Radiologist REIR  Technician: ALLN  Transcriptionist: [email or web address]

## 2020-08-16 NOTE — Procedures (Deleted)
[name], MD - 12/22/2015  Formatting of this note might be different from the original.             Samantha Cameron, Samantha Cameron       [id number]        Samantha Cameron        [id number]                                          27-Dec-1955 54      04/13/2011                Clinical History:  SCREENING                                                   EXAM#     TYPE/ EXAM                       RESULT                        [id number] MAM22/DIGITAL SCREENING MAMMOGR Negative                      [id number] MAM22/DIGITIZATION SCREENING W/                                              Attending Radiologist: Philemon Kingdom, MD, [place] History: Routine screening         Technique: Bilateral digital MLO and CC views of each breast were       performed. Comparison was made with the prior mammogram  of       10/2009 from [facility]. Computerized Aided Detection       (CAD) was utilized.         Findings: The breasts are almost entirely fat ( < 25% glandular).       There is no skin thickening, cluster of suspicious calcifications       or dominant mass.         Conclusion: There is no evidence of malignancy.         ACR Birads Code 1: Negative.         The written results were sent to the patient.                 Result  Code: 1         Follow-up Code: K8         DD:  04/13/2011 10:32:25 AM         Report ID:  [id number]         Dictated: 04/13/11 1032  PAGE 1               Signed Report                     [name])             Samantha Cameron, Samantha Cameron       [id number]        Samantha Cameron        [id number]                                          1956-08-10 54       04/13/2011                Clinical History:  SCREENING                                                   EXAM#     TYPE/EXAM                       RESULT                         [id number] MAM22/DIGITAL SCREENING MAMMOGR Negative                      [id number] MAM22/DIGITIZATION SCREENING W/                                      <Continued>                        ---- Electronic Signature on File ----                      Signed By: [name], M.D.                  ------------------------------            Reported By: [name], M.D.                                                                          CC: Samantha Kindle.O.           PAGE 2               Signed Report  CC: Samantha Payment D.O.           PAGE 2               Signed Report                       Radiologist DRAP  Technician: [facility]  Transcriptionist: [email or web address]

## 2020-08-16 NOTE — ED Triage Note (Signed)
Pt self presents from home with c/o chest pain. Pt endorses pain came on suddenly, unprovoked, nonradiating while sitting at home tonight. States feels like pressure in the middle of her chest. Hx similar episodes of "heart racing" but this time pain too.

## 2020-08-16 NOTE — Discharge Instructions (Signed)
Evaluated for midsternal chest pain that does not appear to be cardiac in etiology.  Chest x-ray is also unremarkable.  This is likely musculoskeletal versus GI.  Please follow-up with your regular doctor for further management

## 2020-08-16 NOTE — Procedures (Deleted)
Guerry Bruin, MD - 07/01/2020  Formatting of this note might be different from the original.  Cath Lab Procedure Report    PROCEDURE DATE: 07/01/2020    Referring Physician: Teodoro Kil, MD    PROCEDURES PERFORMED  CORONARY ANGIOGRAPHY     LEFT HEART CATH  INDICATIONS:  Exersional Dyspnea  Positive Stress Test    HISTORY AND RISK FACTORS:  tobacco use    CARDIAC FINDINGS:    LV FINDINGS:  Elevated EDP - mild      LMCA:  Angiographic ally Normal in Left Main  LAD:  Angiographically Normal in LAD    LAD DIAGONAL 1:  Angiographically Normal in LAD 1st Diagonal  CIRCUMFLEX:  Angiographically Normal in LCX  LCX OM 1:  Angiographically Normal in LCX 1st OM  LCX OM 2:  Angiographically Normal in LCX 2nd OM  RCA:  Angiographically Minimal Disease in RCA  RCA PDA:  Angiographically Normal in RCA PDA  RCA PLV:  Angiographically Normal in RCA PLV    PROCEDURE NOTES:  Prior to  the procedure, the patient was examined and the history and physical was  updated.  Informed Consent obtained.    SPECIMENS COLLECTED:  None    LOCAL ANESTHETIC:  Local anesthetic to right radial region with Lidocaine 1%  PROCEDURAL APPROACH:  Sheath was inserted into the right radial artery  POST SHEATH STATUS:  POST SITE STATUS:  No bleeding / hematoma - Right radial TR Band applied to site.  CONTRAST:  Visipaque 30 ml's  FLUORO:  To tal Fluoro Time: 1.4 mins  Total Fluoro Dose: 358.7 mGy    DIAGNOSTIC EQUIPMENT:  Cardinal Cordis 5Fr JR 4.0  Cardinal Cordis 5Fr JL 3.5  Terumo Glidewire J 1.27mm 180cm  Cardinal Cordis 35 J Exchange (260)                      MEDICATIONS:  Time Out completed per policy with all staff and patient present prior to procedure.  Benadryl 25 mg IV ordered by MD and administered  {Narcotics/Sedation} Fentanyl 50 mcg IV ordered by MD and admin istered  Oxygen:  2 L/min via nasal cannula  IV Fluids: 0.9 NaCl existing IV @ 50 ml/hr  {Narcotics/Sedation} Versed 0.5 mg IV ordered by Md and administered  Lidocaine local infiltrate  3 ml's right wrist  Radial Medications Verapamil 5 mg IA  {Narcotics/Sedation} Versed 0.5 mg IV ordered by Md and administered  {Narcotics/Sedation} Fentanyl 25 mcg IV ordered by MD and administered  Heparin 4000 Units IV  {Narcotics/Sedation} Fentanyl 2 5 mcg IV ordered by MD and administered  visipaque 30cc ^FreeText^      Time    AIR REST  ECG      08:14:50  LV  116/14,  19      08:41:46  LV  115/15,  21      08:41:54  LVp  117/13,  19      08:41:58  AOp  118/75  (95)      08:42:05  AO  112/70  (77)  SA    08:42:20    RECOMMENDATIONS:  Medical therapy - Continue  Risk factor modification                      CONCLUSIONS:  Angiographically Minimal Coronary Disease  Mildly Elevated Fil ling Pressures      Estimated Blood Loss: Minimal  Signed By Laray Anger  M.D. On 07/01/2020 09:13:49  ___________________________________  Narda Amber  Charisse Klinefelter.D.

## 2020-08-16 NOTE — Procedures (Deleted)
Heffess, Dixie Dials, MD - 12/21/2015  Formatting of this note might be different from the original.             Samantha Cameron, Samantha Cameron       [id number]        Miegel M.D.,Robert Audie Pinto        [id number]                                          1956/04/10 56      12/30/2012                Clinical History: ? 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 There is no hardware       complication or interval change in appearance.  Patellofemoral       joint is normal in alignment.  There is no knee joint effusion.         Impression:  Anatomic alignment status post left total knee       replacement.  No hardware complication.                     DD:  12/30/2012 3:19:43 PM         Report ID:  [id number]         Dictated: 12/30/12 1519                               PAGE 1                Signed Report                     (  CONTINUED)             Samantha Cameron, Samantha Cameron       [id number]        Miegel M.D.,Robert Audie Pinto        [id number]                                          Sep 04, 1956 56      12/30/2012                Clinical History: ? LT TKR 04/13/09 HAVING PAIN                                 EXAM#     TYPE/EXAM                       RESULT                         [id number] RAD512/KNEE 3 VIEWS-LEFT                                             <Continued>                        ---- Electronic Signature on File ----                       Signed By: Edwena Felty, M.D.                  ------------------------------            Reported By: Edwena Felty, M.D.                                                                            CC: Almira Bar M.D.           PAG E 2               Signed Report                               CC: Almira Bar M.D.           PAGE 2               Signed Report                       Radiologist HEFA  Technician: [email or web address]  Transcriptionist: [email or web address]

## 2020-08-16 NOTE — Procedures (Deleted)
Sys, Conversion Provider Not In - 03/29/2014  Formatting of this note might be different from the original.  Exam Number:  [id number]                        Report Status:  Final  Type:  MRILumbSpnNE WO  Date/Time:  08/28/2012 17:30  Exam Code:  [id number]  Ordering Provider:  Wonda Olds    HISTORY:         LBP>6 weeks - Radiculopathy>6 weeks - Sciatica - any other - Pain, numbness down both legs.  Pt has fallen due to numbess in  legs x4.  Dx: Degen disc disease      REPORT     TECHNIQUE: MRI of the lumbar spine without contrast.         COMPARISON: None.         FINDINGS:         There is mild levoconvex scoliosis centered at the L3 level. There is        grade 1 anterolisthesis of L4 with respect to L5, and grade 1        retrolisthesis of L5 with respect to S1. Vertebral alignment is        otherwise normal. Vertebral height is preserved.         There is fa irly extensive marrow edema at the L5-S1 level to the left        of midline. This may be associated with back pain, and is probably        due to abnormal force distribution related to scoliosis and        Associated degenerative disc disease.         Marrow signal is otherwise normal.         The conus is normal in appearance and terminates at the L1 level.         At T12-L1, there is no significant posterior disc disease. There is         mild facet arthropathy and ligamentum flavum thickening. There is no        significant spinal canal or neural foraminal stenosis.         At L1-L2, there is a small diffuse disc bulge. There is mild        bilateral facet arthropathy and ligamentum flavum thickening. There        is mild narrowing of the left neural foramen. There is no significant        spinal canal or right foraminal stenosis.         At L2-L3, there is no sign ificant posterior disc disease. There is        mild ligamentum flavum thickening. There is no significant spinal        canal or neural foraminal stenosis.         At L3-L4, there is  right-sided facet arthropathy and ligamentum        flavum thickening. There is no significant posterior disc disease.        There is no significant spinal canal or neural foraminal stenosis.         At L4-L5, there is minimal disc uncovering related to         spondylolisthesis. There is bilateral facet arthropathy and        ligamentum flavum thickening. There is mild spinal canal stenosis.        There is mild narrowing of  the bilateral neural foramina.         At L5-S1, there is left-sided facet arthropathy and bilateral        ligament flavum thickening. There is a diffuse disc bulge. There is        mild spinal canal stenosis. There is mild narrowing of the bilateral        neural  foramina.         The visualized paraspinal tissues are unremarkable.         IMPRESSION:         Degenerative findings, including marrow inflammation at the L5-S1        level that may be associated with back pain. There is mild spinal        canal stenosis and mild neural foraminal stenosis, as described above.      PROVIDERS:                           SIGNATURES:       Copen, Elease Hashimoto MD                  Copen, Elease Hashimoto MD

## 2020-08-16 NOTE — Procedures (Deleted)
Sharilyn Sites, MD - 09/20/2017  Formatting of this note might be different from the original.  MRI LUMBAR SPINE (NEURO) WITHOUT CONTRAST    TECHNIQUE:  MRI of the lumbar spine without intravenous contrast.    COMPARISON: MRI lumbar spine 08/28/2012    FINDINGS:    Mild leftward convex curvature of the lumbar spine is noted. There is minimal  anterolisthesis of L4 on L5 and minimal retrolisthesis of L5 on S1, unchanged.  Vertebral body  heights and sagittal alignment are otherwise maintained. There is  diffuse disc desiccation with loss of intervertebral disc space height at  T11-T12 and L5-S1 with associated degenerative endplate changes. Previously seen  edematous degenerative endplate changes at M6-N8 have improved from the prior  study. Multiple Schmorl's nodes are noted. A rounded T1/T2 hypointense focus is  seen within the L4 vertebral body, unchanged. No suspic ious marrow replacing  lesion is identified.    The distal cord and cauda equina nerve roots are unremarkable in appearance. The  conus medullaris terminates at the level of L1.    The visualized retroperitoneal and posterior paraspinal soft tissues are  unremarkable.    SIGNIFICANT FINDINGS BY LEVEL:    T11-T12: There are posterior endplate osteophytes and bilateral facet  arthropathy without spinal canal or neural foraminal narrowing.     T12-L1: There is bilateral facet arthropathy without spinal canal or neural  foraminal narrowing.    L1-2: There is a diffuse disc bulge, eccentric to the left, bilateral facet  arthropathy, ligamentum flavum thickening, resulting in mild left neural  foraminal narrowing, similar to the prior study. There is no significant right  neural foraminal narrowing or spinal canal stenosis.    L2-3: There is a diffuse disc bulge with bilater al arthropathy and ligamentum  flavum thickening, without significant spinal canal or neural foraminal  narrowing.    L3-4: There is a diffuse disc bulge with bilateral facet  arthropathy and  ligamentum flavum thickening, resulting in moderate spinal canal stenosis,  slightly increased from the prior study. No significant neural foraminal  narrowing is seen.    L4-5: There is a diffuse disc bulge with bilateral facet arthropathy and  li gamentum flavum thickening superimposed on grade 1 anterolisthesis of L4 on  L5, resulting in moderate spinal canal stenosis, slightly increased from the  prior study, and bilateral neural foraminal narrowing. No significant right  neural foraminal narrowing is seen.    L5-S1: There is a diffuse disc bulge, bilateral facet arthropathy, ligamentum  flavum thickening, left more than right, resulting in severe left neural  foraminal narrow ing, progressed from the prior study, and mild spinal canal  stenosis. No significant right neural foraminal narrowing is seen.    IMPRESSION:     Multilevel degenerative changes of the lumbar spine, slightly progressed from  the prior study, with moderate spinal canal stenosis at L3-L4 and L4-L5 and  severe left neural foraminal narrowing at L5-S1. Additional milder scattered  degenerative changes throughout the lumbar spine, as furthe r detailed above.

## 2020-08-16 NOTE — Procedures (Deleted)
Sys, Conversion Provider Not In - 01/13/2014  Formatting of this note might be different from the original.  Exam Number:  [id number]                        Report Status:  Final  Type:  [place]  Date/Time:  04/03/2013 14:21  Exam Code:  [id number]  Ordering Provider:  Ane Payment DO    HISTORY:         Cranial anomaly - Pineal gland cyst      REPORT     TECHNIQUE: MRI of the brain with and without intravenous contrast.         C OMPARISON: None         FINDINGS:         Mild cystic changes are seen in the pineal gland without evidence of        pineal gland enlargement. There is no evidence of abnormal pineal        enhancement.         The brain parenchyma demonstrates no significant abnormality.         There is no evidence of intracranial hemorrhage or acute infarction.         There is no abnormal intraparenchymal enhancement.         The flow voids of the  major intracranial vessels appear intact.         The bones and extracranial soft tissues are unremarkable.         IMPRESSION:          Mild cystic changes in the pineal gland without evidence of abnormal        pineal gland enlargement or abnormal nodular enhancement.  No        evidence of acute infarct, intracranial hemorrhage or mass effect.      PROVIDERS:                           SIGNATURES:       Cecelia Byars MD                             Kelli Hope MD                       Kelli Hope MD

## 2020-08-16 NOTE — ED Provider Notes (Signed)
The patient was seen primarily by me. ED nursing record was reviewed. Prior records as available electronically through the Epic record were reviewed.         HPI:    This is a 64 year old female patient complaining of sternal/epigastric chest pain that started approximate 1 hour prior to arrival.  Patient endorses pain came on suddenly while sitting.  No exertional component no pleuritic component.   no vomiting.  Did notice some palpitations which she has had previously in setting of SVT.  But she states the pain is new.  States also has been on medication for GERD but is no longer on that medication.  No vomiting    ROS: Pertinent positives were reviewed as per the HPI above. All other systems were reviewed and are negative.      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  1. GERD - severe 2.                 Unintentional weight loss 3. Abdominal pain   Plan: 1.                Recommend EGD. This procedure has been fully reviewed                with the patient and written informed consent has been                obtained. 2.   No date: Esophageal reflux  No date: External hemorrhoid      Comment:  per steward records  04/19/2017: Gastritis without bleeding      Comment:  GI endoscopy: 06/2018: Mildly severe reflux esophagitis.               Biopsied.      - Chronic gastritis. Biopsied.      - No  gross lesions in the entire examined duodenum.                Complications:  No immediate complications.                Recommendation:      - Await pathology results. 7/2019GI:               Assessment: Pt is 65F with PMH COPD, morbid obesity,                H.pylori (2009, unclear if treated) who p/w severe GERD                symptoms, unintentional weigh  05/10/2018: Gastroesophageal reflux disease without esophagitis  No date: HTN (hypertension)  02/01/2012: Hypoxia  02/16/2017: Left ear pain  06/07/2015: Left genital labial abscess  06/16/2015: Obesity, Class III, 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y) ([place])  08/16/2015: Osteopenia      Comment:  On xray of left knee per steward records 06/2014 Unclear                if addressed  05/23/2018: Pancreatic lesion  09/14/2015: Right ear impacted cerumen  No date: Sleep apnea  08/15/2015: Type 2 diabetes mellitus without complication, without   long-term current use of insulin (HCC)      Comment:   HEMOGLOBIN A1C (%) Date Value 07/15/2015 6.2 (H)                ----------  Steward record - a1c 6.2 on 01/31/15 6.4 on                09/30/14 5.9 on 12/29/13 6.4 08/18/13   11/18/2017: Viral URI with cough  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) ([place])     Tobacco use disorder     Spinal stenosis of lumbar region     HLD (hyperlipidemia)     Chronic bilateral low back pain     Pineal gland cyst     History of vitamin  D deficiency     Internal hemorrhoids     Venous (peripheral) insufficiency     Adrenal adenoma     Type 2 diabetes mellitus with microalbuminuria, without long-term current use of insulin (HCC)     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 (HCC)     Acute pain of right wrist     Abnormal PFTs     Screening for malignant neoplasm of breast     Skin irritation     H/O bone density study     Vertigo     Orthostatic hypotension     Social problem     Right arm weakness     Insomnia     Acute pain of left knee     Hx of total knee replacement, left     Seasonal allergic rhinitis     Left foot pain     Stress due to family tension     Leucocytosis     Viral URI     Chronic gastritis     Common bile duct dilation     Fatty liver     Pancreatic lesion     Vascular calcification     Umbilical hernia without obstruction and without gangrene     Arthritis of spine     Scoliosis of thoracic spine     Anxiety     Sleep disturbance  Reflux esophagitis     First degree AV block     OSA (obstructive sleep apnea)     Unintentional weight loss     Abdominal pain     Fibroid     Endometrial thickening on ultrasound     ASCUS of cervix with negative high risk HPV     Endometrial polyp     Panic disorder     Adenomatous polyp of colon     Microalbuminuria     Temporomandibular joint disorder     Sensorineural hearing loss (SNHL) of both ears     Cortical senile cataract, left     PAC (premature atrial contraction)     Essential hypertension     SVT (supraventricular tachycardia) (HCC)     DOE (dyspnea on exertion)     Elevated left ventricular end-diastolic pressure (LVEDP)        Past Surgical History: Past Surgical History:  No date: ANES NERVE MUSC TENDON FASCIA&BURSA KNEE&/POPLT  No date: FOOT SURGERY      Comment:  bilateral  No date: LAPAROSCOPY SURG CHOLECYSTECTOMY  No date: OB ANTEPARTUM CARE CESAREAN DLVR & POSTPARTUM      Comment:  x3  No date: TONSILLECTOMY & ADENOIDECTOMY <AGE 71  04/13/2009: TOTAL KNEE  REPLACEMENT      Comment:  left  08/11/2009: WRIST GANGLION EXCISION      Comment:  left       Medications:   No current facility-administered medications for this encounter.     Current Outpatient Medications   Medication Sig    metoprolol (TOPROL-XL) 25 MG 24 hr tablet Take 1 tablet by mouth in the morning and at bedtime    torsemide (DEMADEX) 10 MG tablet Take 1 tablet by mouth daily    Blood Glucose Monitoring Suppl (GLUCOSE MONITORING KIT) monitoring kit Dispense glucometer that is covered by insurance. Dx Code: E11.29    fluticasone (FLONASE) 50 MCG/ACT nasal spray instill 1 spray into each nostril daily    lisinopril (ZESTRIL) 5 MG tablet Take 1 tablet by mouth daily with dinner    umeclidinium (INCRUSE ELLIPTA) 62.5 MCG/INH inhaler Inhale 1 puff into the lungs daily    metFORMIN (GLUCOPHAGE-XR) 500 MG 24 hr tablet take 4 tablets by mouth once daily with BREAKFAST    atorvastatin (LIPITOR) 20 MG tablet take 1 tablet by mouth once daily    Misc. Devices (BATH/SHOWER SEAT) MISC Device 1 Device by Other route daily    albuterol (PROVENTIL) (2.5 MG/3ML) 0.083% nebulizer solution Take 3 mLs by nebulization every 6 (six) hours as needed for Wheezing Dx: Code: J44.9    cholecalciferol (VITAMIN D3) 1000 UNIT tablet Take 1 tablet by mouth daily    pregabalin (LYRICA) 100 MG capsule Take 100 mg by mouth as needed With Dr Dwyane Dee         oxycodone-acetaminophen (PERCOCET) 5-325 MG per tablet Take 1 tablet by mouth 2 (two) times daily as needed With Dr Dwyane Dee    aspirin 81 MG tablet Take 81 mg by mouth daily.         Social History: Social History    Tobacco Use      Smoking status: Former Smoker        Packs/day: 0.50        Years: 31.00        Pack years: 15.5        Types: Cigarettes        Quit date: 03/10/2018  Years since quitting: 2.4      Smokeless tobacco: Never Used      Tobacco comment: quit smoking inform provided to patient    Alcohol use: No        Allergies:  Review of Patient's Allergies  indicates:   Codeine camsylate       Shortness of Breath, Other (See                            Comments)    Comment:Chest pain   Motrin [ibuprofen]      Nausea Only   Sulfa antibiotics       Other (See Comments)   Bactrim                 Rash, Itching      Physical Exam:  ED Triage Vitals [08/16/20 1909]   ED Triage Vitals Brief Group      Temp 98.4 F      Pulse 112      Resp (!) 22      BP 143/80      SpO2 95 %      Pain Score        GENERAL: No acute distress.   SKIN:  Warm & Dry, no rash.  HEAD: Atraumatic. PERRL. EOMI.  Oropharynx: clear.  NECK: No midline tenderness.  No LAN.   LUNGS:  Clear to auscultation bilaterally. No wheezes, rales, rhonchi.   HEART:  RRR.  No murmurs, rubs, or gallops.   ABDOMEN:  Soft, NTND.  No guarding or rebound tenderness.   MUSCULOSKELETAL:  No obvious deformities.    NEUROLOGIC: Alert and oriented.  Moves all extremities well.  PSYCHIATRIC:  Appropriate for age, time of day, and situation    EKG-sinus rhythm at 75.  No ectopy.  No ischemia.  No change from EKG on March 2021.    ED Course and Medical Decision-making:    The patient is a  64 year old female with atypical nonexertional chest pain with unchanged EKG and negative troponin.  Of note patient had cath in August of this year that was normal.  Chest x-ray unremarkable improved post GI cocktail and Tylenol.  Patient had previous been on medication for GERD.  Instructed to call PCP to restart medication.       Additional verbal discharge instructions were provided including patients diagnosis and follow up plan, as well as reasons to return to the Emergency Department which were discussed in detail.  Patient is agreeable with this management.         Condition: Improved and Stable    Disposition: DC  Diagnosis/Diagnoses:  Nonspecific chest    Carlis Stable, MD  Attending Physician  [facility] [place]  [phone]

## 2020-08-16 NOTE — Procedures (Deleted)
[name], MD - 12/22/2015  Formatting of this note might be different from the original.             Samantha Cameron, Samantha Cameron       [id number]        Shirlee Limerick        [id number]                                          1956/03/14 54      04/13/2011                Clinical History:  SCREENING                                                   EXAM#     TYPE/ EXAM                       RESULT                        [id number] MAM22/DIGITAL SCREENING MAMMOGR Negative                      [id number] MAM22/DIGITIZATION SCREENING W/                                              Attending Radiologist: Philemon Kingdom, MD, [place] History: Routine screening         Technique: Bilateral digital MLO and CC views of each breast were       performed. Comparison was made with the prior mammogram  of       10/2009 from [facility]. Computerized Aided Detection       (CAD) was utilized.         Findings: The breasts are almost entirely fat ( < 25% glandular).       There is no skin thickening, cluster of suspicious calcifications       or dominant mass.         Conclusion: There is no evidence of malignancy.         ACR Birads Code 1: Negative.         The written results were sent to the patient.                 Result  Code: 1         Follow-up Code: V6         DD:  04/13/2011 10:32:25 AM         Report ID:  [id number]         Dictated: 04/13/11 1032  PAGE 1               Signed Report                     [name])             ANASTYN, [name]       [id number]        Shirlee Limerick        [id number]                                          1956/01/04 56       04/13/2011                Clinical History:  SCREENING                                                   EXAM#     TYPE/EXAM                       RESULT                         [id number] MAM22/DIGITAL SCREENING MAMMOGR Negative                      [id number] MAM22/DIGITIZATION SCREENING W/                                      <Continued>                        ---- Electronic Signature on File ----                      Signed By: [name], M.D.                  ------------------------------            Reported By: [name], M.D.                                                                          CC: Malon Kindle.O.           PAGE 2               Signed Report  CC: Ane Payment D.O.           PAGE 2               Signed Report                       Radiologist DRAP  Technician: [place]  Transcriptionist: [email or web address]

## 2020-08-16 NOTE — Narrator Note (Signed)
Patient Disposition  Patient education for diagnosis, medications, activity, diet and follow-up.  Patient left ED 10:14 PM.  Patient rep received written instructions.    Interpreter to provide instructions: No    Patient belongings with patient: YES    Have all existing LDAs been addressed? Yes    Have all IV infusions been stopped? N/A    Destination: Discharged to home IN STABLE CONDITION.

## 2020-08-17 DIAGNOSIS — E1040 Type 1 diabetes mellitus with diabetic neuropathy, unspecified: Secondary | ICD-10-CM | POA: Diagnosis not present

## 2020-08-17 DIAGNOSIS — Z96659 Presence of unspecified artificial knee joint: Secondary | ICD-10-CM | POA: Diagnosis not present

## 2020-08-17 DIAGNOSIS — M5417 Radiculopathy, lumbosacral region: Secondary | ICD-10-CM | POA: Diagnosis not present

## 2020-08-17 DIAGNOSIS — M461 Sacroiliitis, not elsewhere classified: Secondary | ICD-10-CM | POA: Diagnosis not present

## 2020-08-18 ENCOUNTER — Ambulatory Visit: Payer: Medicare Other | Attending: Family Medicine | Admitting: Family Medicine

## 2020-08-18 ENCOUNTER — Other Ambulatory Visit: Payer: Self-pay

## 2020-08-18 VITALS — BP 110/66 | HR 105 | Temp 96.6°F | Ht 62.0 in | Wt 223.4 lb

## 2020-08-18 DIAGNOSIS — K2970 Gastritis, unspecified, without bleeding: Secondary | ICD-10-CM | POA: Diagnosis present

## 2020-08-18 DIAGNOSIS — K2100 Gastro-esophageal reflux disease with esophagitis, without bleeding: Secondary | ICD-10-CM | POA: Diagnosis present

## 2020-08-18 MED ORDER — ALUMINUM & MAGNESIUM HYDROXIDE 200-200 MG/5ML PO SUSP
30.0000 mL | Freq: Four times a day (QID) | ORAL | 1 refills | Status: DC | PRN
Start: 2020-08-18 — End: 2020-11-07

## 2020-08-18 MED ORDER — OMEPRAZOLE MAGNESIUM 20 MG PO TBEC
20.0000 mg | DELAYED_RELEASE_TABLET | Freq: Every day | ORAL | 0 refills | Status: DC
Start: 2020-08-18 — End: 2020-08-19

## 2020-08-18 NOTE — Progress Notes (Signed)
CC:  Patient presents with:     SUBJECTIVE  Samantha Cameron is a 64 year old female    With the following problem list  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) ([place])     Tobacco use disorder     Spinal stenosis of lumbar region     HLD (hyperlipidemia)     Chronic bilateral low back pain     Pineal gland cyst     History of vitamin D deficiency     Internal hemorrhoids     Venous (peripheral) insufficiency     Adrenal adenoma     Type 2 diabetes mellitus with microalbuminuria, without long-term current use of insulin ([place])     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 (HCC)     Acute pain of right wrist     Abnormal PFTs     Screening for malignant neoplasm of breast     Skin irritation     H/O bone density study     Vertigo     Orthostatic hypotension     Social problem     Right arm weakness     Insomnia     Acute pain of left knee     Hx of total knee replacement, left     Seasonal allergic rhinitis     Left foot pain     Stress due to family tension     Leucocytosis     Viral URI     Chronic gastritis     Common bile duct dilation     Fatty liver     Pancreatic lesion     Vascular calcification     Umbilical hernia without obstruction and without gangrene     Arthritis of spine     Scoliosis of thoracic spine     Anxiety     Sleep disturbance     Reflux esophagitis     First degree AV block     OSA (obstructive sleep apnea)     Unintentional weight loss     Abdominal pain     Fibroid     Endometrial thickening on ultrasound     ASCUS of cervix with negative high risk HPV     Endometrial polyp     Panic disorder     Adenomatous polyp of colon     Microalbuminuria     Temporomandibular joint disorder     Sensorineural hearing loss (SNHL) of both ears     Cortical senile cataract, left     PAC (premature atrial contraction)     Essential hypertension     SVT (supraventricular tachycardia) (HCC)     DOE (dyspnea on exertion)      Elevated left ventricular end-diastolic pressure (LVEDP)      Who presents for the following today:    #stomach pain, ED f/u  Went to ED 9/28 for sternal pain, burning in quality  In ED EKG was normal, negative troponin, given maalox and helped significnatly  Pain mostly in upper stomach, moves up slightly  No nausea or vomiting  Eating okay but everything hurts  No blood in stool, no black tarry stools  History of gastritis - last endoscopy 8/19: mildly severe reflux esophagitis, chronic gastritis  Not on PPI or H2 blocker    Most Recent Weight Reading(s)  08/18/20 : 101.3 kg (223 lb 6.4 oz)  08/16/20 : 99.8 kg (220 lb)  08/01/20 : 100.7 kg (222 lb)            Medications:    Current Outpatient Medications:     metoprolol (TOPROL-XL) 25 MG 24 hr tablet, Take 1 tablet by mouth in the morning and at bedtime, Disp: 180 tablet, Rfl: 3    torsemide (DEMADEX) 10 MG tablet, Take 1 tablet by mouth daily, Disp: 90 tablet, Rfl: 3    Blood Glucose Monitoring Suppl (GLUCOSE MONITORING KIT) monitoring kit, Dispense glucometer that is covered by insurance. Dx Code: E11.29, Disp: 1 each, Rfl: 0    fluticasone (FLONASE) 50 MCG/ACT nasal spray, instill 1 spray into each nostril daily, Disp: 16 g, Rfl: 11    lisinopril (ZESTRIL) 5 MG tablet, Take 1 tablet by mouth daily with dinner, Disp: 90 tablet, Rfl: 3    umeclidinium (INCRUSE ELLIPTA) 62.5 MCG/INH inhaler, Inhale 1 puff into the lungs daily, Disp: 1 Inhaler, Rfl: 0    metFORMIN (GLUCOPHAGE-XR) 500 MG 24 hr tablet, take 4 tablets by mouth once daily with BREAKFAST, Disp: 360 tablet, Rfl: 3    atorvastatin (LIPITOR) 20 MG tablet, take 1 tablet by mouth once daily, Disp: 90 tablet, Rfl: 3    Misc. Devices (BATH/SHOWER SEAT) MISC Device, 1 Device by Other route daily, Disp: 1 each, Rfl: 0    albuterol (PROVENTIL) (2.5 MG/3ML) 0.083% nebulizer solution, Take 3 mLs by nebulization every 6 (six) hours as needed for Wheezing Dx: Code: J44.9, Disp: 75 mL, Rfl: 1     cholecalciferol (VITAMIN D3) 1000 UNIT tablet, Take 1 tablet by mouth daily, Disp: , Rfl:     pregabalin (LYRICA) 100 MG capsule, Take 100 mg by mouth as needed With Dr Dwyane Dee   , Disp: , Rfl:     oxycodone-acetaminophen (PERCOCET) 5-325 MG per tablet, Take 1 tablet by mouth 2 (two) times daily as needed With Dr Dwyane Dee, Disp: , Rfl:     aspirin 81 MG tablet, Take 81 mg by mouth daily., Disp: , Rfl:       Allergies:  Review of Patient's Allergies indicates:   Codeine camsylate       Shortness of Breath, Other (See                            Comments)    Comment:Chest pain   Motrin [ibuprofen]      Nausea Only   Sulfa antibiotics       Other (See Comments)   Bactrim                 Rash, Itching    ROS: focused ROS as per HPI    All other systems reviewed and are negative.    OBJECTIVE    PHYSICAL EXAM:  Vital Signs  BP 110/66    Pulse 105    Temp 96.6 F (35.9 C) (Temporal)    Ht _0  (1.575 m)    Wt 101.3 kg (223 lb 6.4 oz)    LMP 10/22/2007 (LMP Unknown)    SpO2 96%    BMI 40.86 kg/m     Constitutional: Oriented to person, place, and time. Appears well-developed and well-nourished. No distress.   Head: Normocephalic and atraumatic.   Cardiovascular: Normal rate, regular rhythm and normal heart sounds. Exam reveals no gallop and no friction rub.  No murmur heard.  Pulmonary/Chest: Effort normal and breath sounds normal.   Abd: soft, non-tender, non-distended, normal bowel sounds  Lymphadenopathy: No cervical  adenopathy.   Neurological:Alert and oriented to person, place, and time.   Psychiatric: Has a normal mood and affect.     ASSESSMENT/PLAN  (K21.00) Gastroesophageal reflux disease with esophagitis without hemorrhage  (primary encounter diagnosis)  (K29.70) Gastritis without bleeding, unspecified chronicity, unspecified gastritis type  Comment: Reviewed past EGD, had significant inflammation but not on PPI  Plan: aluminum-magnesium hydroxide (MAALOX) 200-200         mg/5 mL suspension, DISCONTINUED:  omeprazole         (PRILOSEC OTC) 20 MG tablet        Continue maalox prn, start PPI daily   F/u 3 weeks with PCP - if no imrpovement discussed likely repeating endoscopy given previous findings            1. The patient indicates understanding of these issues and agrees with the plan.  2.  The patient is given an After Visit Summary sheet that lists all of their medications with directions, their allergies, orders placed during this encounter, immunization dates, and follow- up instructions.  3. I reviewed the patient's medical information and medical history   4.  I reconciled the patient's medication list and prepared and supplied needed refills.  5.  I have reviewed the past medical, family, and social history sections including the medications and allergies listed in the above medical record  Guadalupe Dawn, MD, 08/18/2020

## 2020-08-19 ENCOUNTER — Telehealth (HOSPITAL_BASED_OUTPATIENT_CLINIC_OR_DEPARTMENT_OTHER): Payer: Self-pay

## 2020-08-19 MED ORDER — OMEPRAZOLE 20 MG PO CPDR
20.0000 mg | DELAYED_RELEASE_CAPSULE | Freq: Every day | ORAL | 2 refills | Status: DC
Start: 2020-08-19 — End: 2020-11-17

## 2020-08-19 NOTE — Telephone Encounter (Signed)
Called patient back, omeprazole tablets not covered by insurance pharmacist advised patient capsules would be covered.

## 2020-08-19 NOTE — Telephone Encounter (Signed)
-----   Message from Lorna Dibble sent at 08/18/2020  5:33 PM EDT -----  Regarding: MEDICAL ADVISE  Spoke to pt has questions about medicine -- would prefer capsules

## 2020-08-21 LAB — EKG

## 2020-08-30 DIAGNOSIS — M10072 Idiopathic gout, left ankle and foot: Secondary | ICD-10-CM | POA: Diagnosis not present

## 2020-08-30 DIAGNOSIS — M79672 Pain in left foot: Secondary | ICD-10-CM | POA: Diagnosis not present

## 2020-08-30 DIAGNOSIS — M779 Enthesopathy, unspecified: Secondary | ICD-10-CM | POA: Diagnosis not present

## 2020-08-31 ENCOUNTER — Other Ambulatory Visit (HOSPITAL_BASED_OUTPATIENT_CLINIC_OR_DEPARTMENT_OTHER): Payer: Self-pay | Admitting: Cardiovascular Disease

## 2020-08-31 NOTE — Telephone Encounter (Signed)
PER Partner, Samantha Cameron is a 64 year old female has requested a refill of torsemide.      Last Office Visit: 08/18/2020 with greenstein  Last Physical Exam: 08/15/2015    There are no preventive care reminders to display for this patient.    ADHD Med: Most Recent BP Reading(s)  08/18/20 : 110/66   Most Recent Weight Reading(s)  08/18/20 : 101.3 kg (223 lb 6.4 oz)      Documented patient preferred pharmacies:    [facility] - [place], [place]  Phone: [phone] Fax: [phone]

## 2020-09-14 DIAGNOSIS — M47817 Spondylosis without myelopathy or radiculopathy, lumbosacral region: Secondary | ICD-10-CM | POA: Diagnosis not present

## 2020-09-14 DIAGNOSIS — M48061 Spinal stenosis, lumbar region without neurogenic claudication: Secondary | ICD-10-CM | POA: Diagnosis not present

## 2020-09-14 DIAGNOSIS — Z79891 Long term (current) use of opiate analgesic: Secondary | ICD-10-CM | POA: Diagnosis not present

## 2020-09-14 DIAGNOSIS — M5416 Radiculopathy, lumbar region: Secondary | ICD-10-CM | POA: Diagnosis not present

## 2020-09-19 ENCOUNTER — Encounter (HOSPITAL_BASED_OUTPATIENT_CLINIC_OR_DEPARTMENT_OTHER): Payer: Self-pay | Admitting: Student in an Organized Health Care Education/Training Program

## 2020-09-19 DIAGNOSIS — M5417 Radiculopathy, lumbosacral region: Secondary | ICD-10-CM | POA: Diagnosis not present

## 2020-09-30 ENCOUNTER — Other Ambulatory Visit: Payer: Self-pay

## 2020-09-30 ENCOUNTER — Ambulatory Visit
Admission: RE | Admit: 2020-09-30 | Discharge: 2020-09-30 | Disposition: A | Discharge status: Home / Self Care | Payer: Medicare Other | Attending: General Surgery | Admitting: General Surgery

## 2020-09-30 ENCOUNTER — Encounter (HOSPITAL_BASED_OUTPATIENT_CLINIC_OR_DEPARTMENT_OTHER): Payer: Self-pay

## 2020-09-30 DIAGNOSIS — Z1231 Encounter for screening mammogram for malignant neoplasm of breast: Secondary | ICD-10-CM | POA: Diagnosis present

## 2020-10-05 NOTE — Telephone Encounter (Signed)
error 

## 2020-10-12 DIAGNOSIS — E1040 Type 1 diabetes mellitus with diabetic neuropathy, unspecified: Secondary | ICD-10-CM | POA: Diagnosis not present

## 2020-10-12 DIAGNOSIS — M5417 Radiculopathy, lumbosacral region: Secondary | ICD-10-CM | POA: Diagnosis not present

## 2020-10-23 ENCOUNTER — Other Ambulatory Visit (HOSPITAL_BASED_OUTPATIENT_CLINIC_OR_DEPARTMENT_OTHER): Payer: Self-pay | Admitting: Physician Assistant

## 2020-10-24 NOTE — Telephone Encounter (Signed)
PER Pharmacy, Samantha Cameron is a 64 year old female has requested a refill of      -  Atorvastatin (LIPITOR) 20 MG tablet      Last Office Visit: 08/18/2020 with Cruzita Lederer  Last Physical Exam: 08/15/15     A1C due on 08/26/2020     Other Med Adult:  Most Recent BP Reading(s)  08/18/20 : 110/66        Cholesterol (mg/dL)   Date Value   05/26/2020 175     LOW DENSITY LIPOPROTEIN DIRECT (mg/dL)   Date Value   05/26/2020 82     HIGH DENSITY LIPOPROTEIN (mg/dL)   Date Value   05/26/2020 61     TRIGLYCERIDES (mg/dL)   Date Value   11/05/2018 229 (H)         THYROID SCREEN TSH REFLEX FT4 (uIU/mL)   Date Value   05/26/2020 2.040         No results found for: TSH    HEMOGLOBIN A1C (%)   Date Value   05/26/2020 6.1 (H)       No results found for: POCA1C      INR (no units)   Date Value   04/18/2017 1.0   05/27/2016 1.0   02/01/2012 < 1.0 (L)       SODIUM (mmol/L)   Date Value   08/16/2020 144       POTASSIUM (mmol/L)   Date Value   08/16/2020 4.5           CREATININE (mg/dL)   Date Value   08/16/2020 0.8        Documented patient preferred pharmacies:    [facility] - [place], [place]  Phone: [phone] Fax: [phone]

## 2020-10-25 DIAGNOSIS — M7751 Other enthesopathy of right foot: Secondary | ICD-10-CM | POA: Diagnosis not present

## 2020-10-25 DIAGNOSIS — M79671 Pain in right foot: Secondary | ICD-10-CM | POA: Diagnosis not present

## 2020-10-25 DIAGNOSIS — M779 Enthesopathy, unspecified: Secondary | ICD-10-CM | POA: Diagnosis not present

## 2020-10-31 ENCOUNTER — Other Ambulatory Visit: Payer: Self-pay

## 2020-10-31 ENCOUNTER — Ambulatory Visit (HOSPITAL_BASED_OUTPATIENT_CLINIC_OR_DEPARTMENT_OTHER): Payer: Self-pay

## 2020-10-31 ENCOUNTER — Ambulatory Visit: Payer: Medicare Other | Attending: Family Medicine | Admitting: Family Medicine

## 2020-10-31 VITALS — BP 121/82 | HR 94 | Temp 97.5°F | Wt 225.6 lb

## 2020-10-31 DIAGNOSIS — Z720 Tobacco use: Secondary | ICD-10-CM | POA: Insufficient documentation

## 2020-10-31 DIAGNOSIS — J4489 Other specified chronic obstructive pulmonary disease: Secondary | ICD-10-CM

## 2020-10-31 DIAGNOSIS — J019 Acute sinusitis, unspecified: Secondary | ICD-10-CM

## 2020-10-31 DIAGNOSIS — J449 Chronic obstructive pulmonary disease, unspecified: Secondary | ICD-10-CM | POA: Diagnosis not present

## 2020-10-31 MED ORDER — NICOTINE 21 MG/24HR TD PT24
1.0000 | MEDICATED_PATCH | TRANSDERMAL | 2 refills | Status: DC
Start: 2020-10-31 — End: 2021-02-02

## 2020-10-31 MED ORDER — AMOXICILLIN-POT CLAVULANATE 875-125 MG PO TABS
1.0000 | ORAL_TABLET | Freq: Two times a day (BID) | ORAL | 0 refills | Status: AC
Start: 2020-10-31 — End: 2020-11-10

## 2020-10-31 MED ORDER — ALBUTEROL SULFATE (2.5 MG/3ML) 0.083% IN NEBU
2.5000 mg | INHALATION_SOLUTION | Freq: Four times a day (QID) | RESPIRATORY_TRACT | 1 refills | Status: DC | PRN
Start: 2020-10-31 — End: 2023-03-12

## 2020-10-31 MED ORDER — UMECLIDINIUM BROMIDE 62.5 MCG/INH IN AEPB
1.0000 | INHALATION_SPRAY | Freq: Every day | RESPIRATORY_TRACT | 1 refills | Status: DC
Start: 2020-10-31 — End: 2021-02-21

## 2020-10-31 NOTE — Progress Notes (Signed)
Acute Care Clinic Note    Subjective  Samantha Cameron 64 year old English-speaking female who presents for evaluation in acute care clinic.    HPI  Vaccinated    First day of symptoms 10/28/20  Headache 9/10  HA has been constant since Friday  + ear pressure  + facial pressure  Nasal congestion and body aches  Denies cough, fever  Denies sick contacts  Denies weakness, CP, palpitations    Smoker-1/2/ppd.    Pertinent occupation, housing, or social comments: lives with grand daughter.    ROS  No abdominal pain, nausea, vomiting, diarrhea. No lower extremity swelling. No loss of smell. No new rashes or skin changes.       Per chart review prior to visit:  Review of Patient's Allergies indicates:   Codeine camsylate       Shortness of Breath, Other (See                            Comments)    Comment:Chest pain   Motrin [ibuprofen]      Nausea Only   Sulfa antibiotics       Other (See Comments)   Bactrim                 Rash, Itching    Objective  BP 121/82 (Site: LA, Position: Sitting, Cuff Size: Lrg)   Pulse 94   Temp 97.5 F (36.4 C) (Temporal)   Wt 102.3 kg (225 lb 9.6 oz)   LMP 10/22/2007 (LMP Unknown)   SpO2 97%   BMI 41.26 kg/m      Most Recent O2 Sat Reading(s)  10/31/20 : 97%  08/18/20 : 96%  08/16/20 : 96%    Gen: no respiratory distress  HEENT:  Nares: patent, TM- + fluid behind TM  + pain over frontal sinuses  CV: RRR  Pulm: no rhonchi, no wheezing, no bibasilar rales  Skin: good skin turgor, not diaphoretic  Neuro: alert and oriented to time, person and place  Psych: normal affect. Normal thought content, speech, mood are noted.    Assessment  Clinical assessment: URI/Acute sinusitis  Possible alternative diagnosis: low probability of COVID      Plan  - *See meds  - Symptomatic treatment discussed   - Precautions given, including to call immediately or if new or worsening shortness of breath, and when to seek emergency care or call 911.  - Discussed smoking cessation-- sending Nicotine patch  -  reviewed inhalers use.      I spent a total of 30 minutes on this visit on the date of service (total time includes all activities performed on the date of service)        Cherylann Parr, MD

## 2020-10-31 NOTE — Telephone Encounter (Signed)
Regarding: covid related/ sniffles, sneezing/ headaches  ----- Message from Elna Breslow sent at 10/31/2020  9:23 AM EST -----  Samantha Cameron [id number], 64 year old, female    Calls today:  Sick    What are the symptoms headaches and sneezing and sniffles  How long has patient been sick? n/a  What has pt. tried at home n/a  Person calling on behalf of patient: Patient (self)        Patient's language of care: English    Patient does not need an interpreter.    Patient's PCP: Driscilla Grammes, MD

## 2020-10-31 NOTE — Telephone Encounter (Signed)
Call to pt   Pt symptoms started Friday  C/o "bad" HA 9/10  Taking tylenol with no relief  HA has been constant since Friday  Nasal congestion and body aches  Denies cough, fever  Denies sick contacts  Denies weakness, CP, palpitations  Pt thinks it's sinus related  Also reports some dizziness  Has not been tested for COVID-19  Requesting in person eval today  RN recommended ED eval for headache   Pt wants appt  Scheduled today at [facility] pt should be seen in ED for 9/10 HA   Pt voiced understanding she will see how she feels and go to ED if she gets worse

## 2020-11-07 ENCOUNTER — Other Ambulatory Visit (HOSPITAL_BASED_OUTPATIENT_CLINIC_OR_DEPARTMENT_OTHER): Payer: Self-pay | Admitting: Family Medicine

## 2020-11-07 DIAGNOSIS — K2970 Gastritis, unspecified, without bleeding: Secondary | ICD-10-CM

## 2020-11-07 MED ORDER — ALUMINUM & MAGNESIUM HYDROXIDE 200-200 MG/5ML PO SUSP
30.00 mL | Freq: Four times a day (QID) | ORAL | 1 refills | Status: AC | PRN
Start: 2020-11-07 — End: 2021-01-06

## 2020-11-07 NOTE — Telephone Encounter (Signed)
PER Patient (self), Samantha Cameron is a 63 year old female has requested a refill of aluminum-magnesium hydroxide (MAALOX) 200-200 mg/5 mL suspension.      Last Office Visit: 10/31/2020 with Rhoderick Moody  Last Physical Exam: 08/15/2015      Other Med Adult:  Most Recent BP Reading(s)  10/31/20 : 121/82        Cholesterol (mg/dL)   Date Value   05/26/2020 175     LOW DENSITY LIPOPROTEIN DIRECT (mg/dL)   Date Value   05/26/2020 82     HIGH DENSITY LIPOPROTEIN (mg/dL)   Date Value   05/26/2020 61     TRIGLYCERIDES (mg/dL)   Date Value   11/05/2018 229 (H)         THYROID SCREEN TSH REFLEX FT4 (uIU/mL)   Date Value   05/26/2020 2.040         No results found for: TSH    HEMOGLOBIN A1C (%)   Date Value   05/26/2020 6.1 (H)       No results found for: POCA1C      INR (no units)   Date Value   04/18/2017 1.0   05/27/2016 1.0   02/01/2012 < 1.0 (L)       SODIUM (mmol/L)   Date Value   08/16/2020 144       POTASSIUM (mmol/L)   Date Value   08/16/2020 4.5           CREATININE (mg/dL)   Date Value   08/16/2020 0.8       Documented patient preferred pharmacies:    [place] - [place], [place]  Phone: [phone] Fax: [phone]

## 2020-11-09 DIAGNOSIS — M5136 Other intervertebral disc degeneration, lumbar region: Secondary | ICD-10-CM | POA: Diagnosis not present

## 2020-11-09 DIAGNOSIS — Z79891 Long term (current) use of opiate analgesic: Secondary | ICD-10-CM | POA: Diagnosis not present

## 2020-11-09 DIAGNOSIS — M5417 Radiculopathy, lumbosacral region: Secondary | ICD-10-CM | POA: Diagnosis not present

## 2020-11-09 DIAGNOSIS — M4727 Other spondylosis with radiculopathy, lumbosacral region: Secondary | ICD-10-CM | POA: Diagnosis not present

## 2020-11-17 ENCOUNTER — Other Ambulatory Visit (HOSPITAL_BASED_OUTPATIENT_CLINIC_OR_DEPARTMENT_OTHER): Payer: Self-pay

## 2020-11-17 MED ORDER — OMEPRAZOLE 20 MG PO CPDR
20.0000 mg | DELAYED_RELEASE_CAPSULE | Freq: Every day | ORAL | 2 refills | Status: DC
Start: 2020-11-17 — End: 2021-04-03

## 2020-11-17 NOTE — Telephone Encounter (Signed)
-----   Message from [facility] sent at 11/17/2020  3:27 PM EST -----  Regarding: rx refill  [facility]    Person calling on behalf of patient: Pharmacy    May list multiple medications in this section    Medicine Name: omeprazole (PRILOSEC) 20 MG capsule         Documented patient preferred pharmacies:   [facility] [address] [place], [place] - [address]  Phone: [phone] Fax: [phone]          Patient's language of care: [place]

## 2020-11-17 NOTE — Telephone Encounter (Signed)
PER Pharmacy, Samantha Cameron is a 64 year old female has requested a refill of omeprazole (PRILOSEC) 20 MG capsule .        Last Office Visit: 10/31/2020 with Rabkina,A  Last Physical Exam: 08/15/2015    A1C due on 08/26/2020    Other Med Adult:  Most Recent BP Reading(s)  10/31/20 : 121/82        Cholesterol (mg/dL)   Date Value   00/17/4944 175     LOW DENSITY LIPOPROTEIN DIRECT (mg/dL)   Date Value   96/75/9163 82     HIGH DENSITY LIPOPROTEIN (mg/dL)   Date Value   84/66/5993 61     TRIGLYCERIDES (mg/dL)   Date Value   57/11/7791 229 (H)         THYROID SCREEN TSH REFLEX FT4 (uIU/mL)   Date Value   05/26/2020 2.040         No results found for: TSH    HEMOGLOBIN A1C (%)   Date Value   05/26/2020 6.1 (H)       No results found for: POCA1C      INR (no units)   Date Value   04/18/2017 1.0   05/27/2016 1.0   02/01/2012 < 1.0 (L)       SODIUM (mmol/L)   Date Value   08/16/2020 144       POTASSIUM (mmol/L)   Date Value   08/16/2020 4.5           CREATININE (mg/dL)   Date Value   90/30/0923 0.8       Documented patient preferred pharmacies:    [facility] - [address] - [place], [place] - [address]  Phone: [phone] Fax: [phone]

## 2020-11-28 DIAGNOSIS — M47816 Spondylosis without myelopathy or radiculopathy, lumbar region: Secondary | ICD-10-CM | POA: Diagnosis not present

## 2020-12-07 DIAGNOSIS — M5136 Other intervertebral disc degeneration, lumbar region: Secondary | ICD-10-CM | POA: Diagnosis not present

## 2020-12-07 DIAGNOSIS — M5417 Radiculopathy, lumbosacral region: Secondary | ICD-10-CM | POA: Diagnosis not present

## 2020-12-07 DIAGNOSIS — M4726 Other spondylosis with radiculopathy, lumbar region: Secondary | ICD-10-CM | POA: Diagnosis not present

## 2020-12-25 ENCOUNTER — Other Ambulatory Visit (HOSPITAL_BASED_OUTPATIENT_CLINIC_OR_DEPARTMENT_OTHER): Payer: Self-pay | Admitting: Internal Medicine

## 2020-12-25 DIAGNOSIS — E1129 Type 2 diabetes mellitus with other diabetic kidney complication: Secondary | ICD-10-CM

## 2020-12-25 NOTE — Telephone Encounter (Signed)
PER Pharmacy, Samantha Cameron is a 65 year old female has requested a refill of : metFORMIN (GLUCOPHAGE-XR) 500 MG 24 hr table.      Last Office Visit:10/31/2020 with Rabkina,A  Last Physical Exam:08/15/2015    A1C due on 08/26/2020    Other Med Adult:  Most Recent BP Reading(s)  10/31/20 : 121/82        Cholesterol (mg/dL)   Date Value   05/26/2020 175     LOW DENSITY LIPOPROTEIN DIRECT (mg/dL)   Date Value   05/26/2020 82     HIGH DENSITY LIPOPROTEIN (mg/dL)   Date Value   05/26/2020 61     TRIGLYCERIDES (mg/dL)   Date Value   11/05/2018 229 (H)         THYROID SCREEN TSH REFLEX FT4 (uIU/mL)   Date Value   05/26/2020 2.040         No results found for: TSH    HEMOGLOBIN A1C (%)   Date Value   05/26/2020 6.1 (H)       No results found for: POCA1C      INR (no units)   Date Value   04/18/2017 1.0   05/27/2016 1.0   02/01/2012 < 1.0 (L)       SODIUM (mmol/L)   Date Value   08/16/2020 144       POTASSIUM (mmol/L)   Date Value   08/16/2020 4.5           CREATININE (mg/dL)   Date Value   08/16/2020 0.8       Documented patient preferred pharmacies:    [place] - [place], [place]  Phone: [phone] Fax: [phone]

## 2021-01-04 DIAGNOSIS — M47816 Spondylosis without myelopathy or radiculopathy, lumbar region: Secondary | ICD-10-CM | POA: Diagnosis not present

## 2021-01-04 DIAGNOSIS — Z79891 Long term (current) use of opiate analgesic: Secondary | ICD-10-CM | POA: Diagnosis not present

## 2021-01-06 ENCOUNTER — Other Ambulatory Visit (HOSPITAL_BASED_OUTPATIENT_CLINIC_OR_DEPARTMENT_OTHER): Payer: Self-pay | Admitting: Clinical Cardiac Electrophysiology

## 2021-01-06 ENCOUNTER — Telehealth (HOSPITAL_BASED_OUTPATIENT_CLINIC_OR_DEPARTMENT_OTHER): Payer: Self-pay

## 2021-01-06 NOTE — Telephone Encounter (Signed)
Call placed to pt. Pt informed of prescription refill. Dosing instructions verified with pt. Pt states that she has been feeling well lately. No sensation of elevated HR, no recent illness. Pt states that she needs a replacement mouth-piece and tubing for her C-pap machine. Pt has [facility] [employer]: [phone]. She missed several appointments at [facility] where she was supposed to bring in machine to be checked. RN will follow up on what is needed to order equipment.       lisinopril (ZESTRIL) 5 MG tablet         Sig: take 1 tablet by mouth once daily WITH DINNER    Disp:  90 tablet  Refills:  3 (Pharmacy requested: Not specified)    Start: 01/06/2021 - 01/06/2022    Class: E-Prescribe    Authorized by: Jerrell Mylar, MD    To pharmacy: Dose being changed to 5 mg qPM        To be filled at: [facility], [place]

## 2021-01-06 NOTE — Telephone Encounter (Signed)
PER Pharmacy, Samantha Cameron is a 65 year old female has requested a refill of lisinopril 5.      Last Office Visit: 10-31-20 with Cherylann Parr  Last Physical Exam: 08-15-15    A1C due on 08/26/2020    HTN Med:    Most Recent BP Reading(s)  10/31/20 : 121/82  08/18/20 : 110/66  08/16/20 : 105/69      Documented patient preferred pharmacies:    [place] - [place], [place]  Phone: [phone] Fax: [phone]

## 2021-01-06 NOTE — Telephone Encounter (Signed)
Call placed to [facility].  Reminded her that [employer] puts clients on a schedule to receive Cpap supplies and to troubleshoot issues. # provided. Samantha Cameron

## 2021-01-17 ENCOUNTER — Telehealth (HOSPITAL_BASED_OUTPATIENT_CLINIC_OR_DEPARTMENT_OTHER): Payer: Self-pay | Admitting: Ambulatory Care

## 2021-01-17 ENCOUNTER — Telehealth (HOSPITAL_BASED_OUTPATIENT_CLINIC_OR_DEPARTMENT_OTHER): Payer: Self-pay

## 2021-01-17 NOTE — Telephone Encounter (Signed)
Pt came into to [facility] to drop off forms to be completed and [facility] when done.   Forms placed in Driscilla Grammes, MD folder in blue pod.

## 2021-01-17 NOTE — Telephone Encounter (Signed)
[place] [facility]  Phone Number: [phone]     [name] [phone], 65 year old, female     Calls today: Request to speak with nurse to discuss custody of grand kids, would like to relay message to nurse an d Driscilla Grammes, MD to let them be aware of future call on her behalf.     Person calling on behalf of patient: Patient (self)     CALL BACK NUMBER: [phone]   Best time to call back: anytime   Cell phone: [phone]       Patient's language of care: English     Patient's PCP: Driscilla Grammes, MD       Returned call to pt   She states her grand children are currently in [facility] custody   Their ages are 45 and 79   DCF came to her home today to clear her to allow her to take custody of the children     She has filled out the forms necessary and dropped them off at [facility]   States she has already done this in the past  with her 48 yr old granddaughter       States she feels DCF will want to speak to Dr. [name] to clear her     Pt also requesting updated lab work for her A1c  In person appointment scheduled to discuss     Jeanmarie Hubert, RN, 01/17/2021

## 2021-01-18 ENCOUNTER — Ambulatory Visit (HOSPITAL_BASED_OUTPATIENT_CLINIC_OR_DEPARTMENT_OTHER): Payer: Medicare Other | Admitting: Internal Medicine

## 2021-01-23 ENCOUNTER — Encounter (HOSPITAL_BASED_OUTPATIENT_CLINIC_OR_DEPARTMENT_OTHER): Payer: Self-pay | Admitting: Internal Medicine

## 2021-01-23 NOTE — Progress Notes (Signed)
Medicare part B DWO for lancets  Completed, will be faxed and scanned

## 2021-01-31 ENCOUNTER — Ambulatory Visit (HOSPITAL_BASED_OUTPATIENT_CLINIC_OR_DEPARTMENT_OTHER): Payer: Medicare Other | Admitting: Cardiovascular Disease

## 2021-02-01 DIAGNOSIS — M5416 Radiculopathy, lumbar region: Secondary | ICD-10-CM | POA: Diagnosis not present

## 2021-02-01 DIAGNOSIS — M25561 Pain in right knee: Secondary | ICD-10-CM | POA: Diagnosis not present

## 2021-02-01 DIAGNOSIS — M47817 Spondylosis without myelopathy or radiculopathy, lumbosacral region: Secondary | ICD-10-CM | POA: Diagnosis not present

## 2021-02-02 ENCOUNTER — Encounter (HOSPITAL_BASED_OUTPATIENT_CLINIC_OR_DEPARTMENT_OTHER): Payer: Self-pay | Admitting: Internal Medicine

## 2021-02-02 ENCOUNTER — Other Ambulatory Visit: Payer: Self-pay

## 2021-02-02 ENCOUNTER — Ambulatory Visit: Payer: Medicare Other | Attending: Internal Medicine | Admitting: Internal Medicine

## 2021-02-02 VITALS — BP 90/60 | HR 104 | Temp 96.2°F | Wt 216.0 lb

## 2021-02-02 DIAGNOSIS — F17200 Nicotine dependence, unspecified, uncomplicated: Secondary | ICD-10-CM | POA: Diagnosis present

## 2021-02-02 DIAGNOSIS — J4489 Other specified chronic obstructive pulmonary disease: Secondary | ICD-10-CM

## 2021-02-02 DIAGNOSIS — R809 Proteinuria, unspecified: Secondary | ICD-10-CM | POA: Diagnosis present

## 2021-02-02 DIAGNOSIS — I10 Essential (primary) hypertension: Secondary | ICD-10-CM | POA: Insufficient documentation

## 2021-02-02 DIAGNOSIS — I471 Supraventricular tachycardia: Secondary | ICD-10-CM

## 2021-02-02 DIAGNOSIS — J3489 Other specified disorders of nose and nasal sinuses: Secondary | ICD-10-CM | POA: Diagnosis not present

## 2021-02-02 DIAGNOSIS — Z639 Problem related to primary support group, unspecified: Secondary | ICD-10-CM | POA: Diagnosis present

## 2021-02-02 DIAGNOSIS — Z6839 Body mass index (BMI) 39.0-39.9, adult: Secondary | ICD-10-CM | POA: Insufficient documentation

## 2021-02-02 DIAGNOSIS — J449 Chronic obstructive pulmonary disease, unspecified: Secondary | ICD-10-CM | POA: Diagnosis present

## 2021-02-02 DIAGNOSIS — Z8679 Personal history of other diseases of the circulatory system: Secondary | ICD-10-CM | POA: Insufficient documentation

## 2021-02-02 DIAGNOSIS — Z23 Encounter for immunization: Secondary | ICD-10-CM | POA: Diagnosis present

## 2021-02-02 DIAGNOSIS — E1129 Type 2 diabetes mellitus with other diabetic kidney complication: Secondary | ICD-10-CM | POA: Diagnosis present

## 2021-02-02 LAB — BASIC METABOLIC PANEL
ANION GAP: 4 mmol/L — ABNORMAL LOW (ref 5–15)
BUN (UREA NITROGEN): 20 mg/dL — ABNORMAL HIGH (ref 7–18)
CALCIUM: 9.7 mg/dL (ref 8.5–10.1)
CARBON DIOXIDE: 28 mmol/L (ref 21–32)
CHLORIDE: 104 mmol/L (ref 98–107)
CREATININE: 0.8 mg/dL (ref 0.4–1.2)
ESTIMATED GLOMERULAR FILT RATE: >60 mL/min (ref >60)
Glucose Random: 103 mg/dL (ref 74–160)
POTASSIUM: 4 mmol/L (ref 3.5–5.1)
SODIUM: 136 mmol/L (ref 136–145)

## 2021-02-02 NOTE — Progress Notes (Signed)
VIS given prior to administration and reviewed with the patient and or legal guardian. Patient understands the disease and the vaccine. See immunization/Injection module or chart review for date of publication and additional information.    Hilton Sinclair, LPN, 6/83/7290

## 2021-02-02 NOTE — Progress Notes (Signed)
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)     Tobacco use disorder     Spinal stenosis of lumbar region     HLD (hyperlipidemia)     Chronic bilateral low back pain     Pineal gland cyst     History of vitamin D deficiency     Internal hemorrhoids     Venous (peripheral) insufficiency     Adrenal adenoma     Type 2 diabetes mellitus with microalbuminuria, without long-term current use of insulin (HCC)     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 (HCC)     Acute pain of right wrist     Abnormal PFTs     Skin irritation     Vertigo     Orthostatic hypotension     Social problem     Right arm weakness     Insomnia     Hx of total knee replacement, left     Seasonal allergic rhinitis     Stress due to family tension     Chronic gastritis     Common bile duct dilation     Fatty liver     Pancreatic lesion     Vascular calcification     Umbilical hernia without obstruction and without gangrene     Scoliosis of thoracic spine     Anxiety     Sleep disturbance     Reflux esophagitis     First degree AV block     OSA (obstructive sleep apnea)     Unintentional weight loss     Fibroid     Endometrial thickening on ultrasound     ASCUS of cervix with negative high risk HPV     Endometrial polyp     Panic disorder     Adenomatous polyp of colon     Microalbuminuria     Temporomandibular joint disorder     Sensorineural hearing loss (SNHL) of both ears     Cortical senile cataract, left     PAC (premature atrial contraction)     Essential hypertension     SVT (supraventricular tachycardia) (HCC)     DOE (dyspnea on exertion)     Elevated left ventricular end-diastolic pressure (LVEDP)    Will be formally adopting 2 more grandchildren - siblings 58 yo boy and 7 yo girl of prior adopted 67 year old girl  Needs DCF health form completed    Taking metformin  Working on  healthy eating and exercise  HEMOGLOBIN A1C (%)   Date Value   02/02/2021 6.3 (H)   05/26/2020 6.1 (H)   09/29/2019 6.1 (H)     No dizziness, weakness  Taking toprol xl, lisinopril, torsemide  Most Recent BP Reading(s)  02/02/21 : 90/60  10/31/20 : 121/82  08/18/20 : 110/66  08/16/20 : 105/69  08/01/20 : 116/61    Has been walking more  Watching what eats - more salads  Most Recent Weight Reading(s)  02/02/21 : 98 kg (216 lb)  10/31/20 : 102.3 kg (225 lb 9.6 oz)  09/30/20 : 101.3 kg (223 lb 5.2 oz)  08/18/20 : 101.3 kg (223 lb 6.4 oz)  08/16/20 : 99.8 kg (220 lb)    Tobacco - smoking outside, 5 cig per day, plans to cut down   Rare albuterol use  Hx SVT - on metoprolol    Agrees to pneumonia vaccine    Recent sinus pressure  Not severe  For few days  No fever  No bloody or purulent nasal discharge    PE:  BP 90/60    Pulse 104    Temp 96.2 F (35.7 C) (Temporal)    Wt 98 kg (216 lb)    LMP 10/22/2007 (LMP Unknown)    SpO2 98%    BMI 39.51 kg/m   Estimated body mass index is 39.51 kg/m as calculated from the following:    Height as of 09/30/20: 5\' 2"  (1.575 m).    Weight as of this encounter: 98 kg (216 lb).  Gen - Upright in chair in NAD  HEENT - MMM  CV - RRR, no m/r/g  Resp - CTAB  Abd - +BS, soft, NT/ND  Ext - Warm, dry  Neuro - A+Ox3  Psych - Appropriate    A/P:  (Z63.9) Family problems  (primary encounter diagnosis)  Comment: adopting 2 more grandchildren  Plan: Form to be completed for Monday pick up    (E11.29,  R80.9) Type 2 diabetes mellitus with microalbuminuria, without long-term current use of insulin (HCC)  Comment: controlled, continue current regimen and lifestyle change  Plan: metFORMIN (GLUCOPHAGE-XR) 500 MG 24 hr tablet,         HEMOGLOBIN A1C    (Z68.39) BMI 39.0-39.9,adult  Comment: decreasing  Plan: Discussed portion control, healthy food choice, exercise    (F17.200) Tobacco use disorder  (J44.9) 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)  Comment: Stable  copd, contemplative about tob cessation  Plan: continue albuterol prn  Pneumonia shot as below  Smoking cessation encouraged    (I10) Essential hypertension  Comment: controlled, borderline low, monitor for hypotension symptoms, ensure PO hydration, continue current regimen  Plan: BASIC METABOLIC PANEL    (T77) Need for prophylactic vaccination against Streptococcus pneumoniae (pneumococcus)  Plan: IMMUNIZATION ADMIN SINGLE, PPSV23 VACCINE 2 YRS        OR OLDER FOR SUBQ/IM USE          (Z86.79) Hx of supraventricular tachycardia  Comment: mild tachy today likely with stress of above  Plan: Monitor  May need to decrease rest of BP regimen and increase beta blocker if continues    (J34.89) Sinus pressure  Comment: not clearly bacterial sinus infection  Plan: hydration, rest, warm wet air inhalation from shower, warm compresses over face, trial decongestant temporarily  Call if worsening or not improving in 1 week

## 2021-02-03 LAB — HEMOGLOBIN A1C
ESTIMATED AVERAGE GLUCOSE: 134 mg/dL (ref 74–160)
HEMOGLOBIN A1C: 6.3 % — ABNORMAL HIGH (ref 4.0–5.6)

## 2021-02-06 ENCOUNTER — Encounter (HOSPITAL_BASED_OUTPATIENT_CLINIC_OR_DEPARTMENT_OTHER): Payer: Self-pay | Admitting: Internal Medicine

## 2021-02-06 DIAGNOSIS — J3489 Other specified disorders of nose and nasal sinuses: Secondary | ICD-10-CM | POA: Insufficient documentation

## 2021-02-06 DIAGNOSIS — E669 Obesity, unspecified: Secondary | ICD-10-CM | POA: Insufficient documentation

## 2021-02-06 DIAGNOSIS — Z8679 Personal history of other diseases of the circulatory system: Secondary | ICD-10-CM | POA: Insufficient documentation

## 2021-02-06 DIAGNOSIS — Z6839 Body mass index (BMI) 39.0-39.9, adult: Secondary | ICD-10-CM | POA: Insufficient documentation

## 2021-02-06 DIAGNOSIS — Z6838 Body mass index (BMI) 38.0-38.9, adult: Secondary | ICD-10-CM | POA: Insufficient documentation

## 2021-02-06 DIAGNOSIS — Z639 Problem related to primary support group, unspecified: Secondary | ICD-10-CM | POA: Insufficient documentation

## 2021-02-06 HISTORY — DX: Obesity, unspecified: E66.9

## 2021-02-06 NOTE — Progress Notes (Signed)
DCF adoption form completed  Will be copied to scan and original left for patient pickup

## 2021-02-20 ENCOUNTER — Other Ambulatory Visit (HOSPITAL_BASED_OUTPATIENT_CLINIC_OR_DEPARTMENT_OTHER): Payer: Self-pay | Admitting: Family Medicine

## 2021-02-21 NOTE — Telephone Encounter (Signed)
PER Pharmacy, Samantha Cameron is a 65 year old female has requested a refill of Incruse Ellipta.      Last Office Visit: 02/02/21 with [name]  Last Physical Exam: 08/15/15    There are no preventive care reminders to display for this patient.    Other Med Adult:  Most Recent BP Reading(s)  02/02/21 : 90/60        Cholesterol (mg/dL)   Date Value   05/26/2020 175     LOW DENSITY LIPOPROTEIN DIRECT (mg/dL)   Date Value   05/26/2020 82     HIGH DENSITY LIPOPROTEIN (mg/dL)   Date Value   05/26/2020 61     TRIGLYCERIDES (mg/dL)   Date Value   11/05/2018 229 (H)         THYROID SCREEN TSH REFLEX FT4 (uIU/mL)   Date Value   05/26/2020 2.040         No results found for: TSH    HEMOGLOBIN A1C (%)   Date Value   02/02/2021 6.3 (H)       No results found for: POCA1C      INR (no units)   Date Value   04/18/2017 1.0   05/27/2016 1.0   02/01/2012 < 1.0 (L)       SODIUM (mmol/L)   Date Value   02/02/2021 136       POTASSIUM (mmol/L)   Date Value   02/02/2021 4.0           CREATININE (mg/dL)   Date Value   02/02/2021 0.8       Documented patient preferred pharmacies:    [place] - [place], [place]  Phone: [phone] Fax: [phone]

## 2021-02-27 ENCOUNTER — Encounter (HOSPITAL_BASED_OUTPATIENT_CLINIC_OR_DEPARTMENT_OTHER): Payer: Self-pay | Admitting: Internal Medicine

## 2021-02-27 NOTE — Progress Notes (Signed)
Medicare DWO for DM test strips  Signed, will be scanned and sent

## 2021-02-27 NOTE — Progress Notes (Signed)
Also DWO for lancets  same

## 2021-03-01 DIAGNOSIS — Z79891 Long term (current) use of opiate analgesic: Secondary | ICD-10-CM | POA: Diagnosis not present

## 2021-03-01 DIAGNOSIS — M47817 Spondylosis without myelopathy or radiculopathy, lumbosacral region: Secondary | ICD-10-CM | POA: Diagnosis not present

## 2021-03-01 DIAGNOSIS — M25561 Pain in right knee: Secondary | ICD-10-CM | POA: Diagnosis not present

## 2021-03-14 DIAGNOSIS — M79672 Pain in left foot: Secondary | ICD-10-CM | POA: Diagnosis not present

## 2021-03-14 DIAGNOSIS — M2012 Hallux valgus (acquired), left foot: Secondary | ICD-10-CM | POA: Diagnosis not present

## 2021-03-15 DIAGNOSIS — M47816 Spondylosis without myelopathy or radiculopathy, lumbar region: Secondary | ICD-10-CM | POA: Diagnosis not present

## 2021-03-15 DIAGNOSIS — F40231 Fear of injections and transfusions: Secondary | ICD-10-CM | POA: Diagnosis not present

## 2021-03-29 DIAGNOSIS — M5137 Other intervertebral disc degeneration, lumbosacral region: Secondary | ICD-10-CM | POA: Diagnosis not present

## 2021-04-01 ENCOUNTER — Other Ambulatory Visit (HOSPITAL_BASED_OUTPATIENT_CLINIC_OR_DEPARTMENT_OTHER): Payer: Self-pay | Admitting: Internal Medicine

## 2021-04-01 NOTE — Telephone Encounter (Signed)
PER Patient (self), Samantha Cameron is a 64 year old female has requested a refill of omeprazole      Last Office Visit: 02/02/2021 with simons  Last Physical Exam: 08/15/2015    There are no preventive care reminders to display for this patient.    Other Med Adult:  Most Recent BP Reading(s)  02/02/21 : 90/60        Cholesterol (mg/dL)   Date Value   05/26/2020 175     LOW DENSITY LIPOPROTEIN DIRECT (mg/dL)   Date Value   05/26/2020 82     HIGH DENSITY LIPOPROTEIN (mg/dL)   Date Value   05/26/2020 61     TRIGLYCERIDES (mg/dL)   Date Value   11/05/2018 229 (H)         THYROID SCREEN TSH REFLEX FT4 (uIU/mL)   Date Value   05/26/2020 2.040         No results found for: TSH    HEMOGLOBIN A1C (%)   Date Value   02/02/2021 6.3 (H)       No results found for: POCA1C      INR (no units)   Date Value   04/18/2017 1.0   05/27/2016 1.0   02/01/2012 < 1.0 (L)       SODIUM (mmol/L)   Date Value   02/02/2021 136       POTASSIUM (mmol/L)   Date Value   02/02/2021 4.0           CREATININE (mg/dL)   Date Value   02/02/2021 0.8       Documented patient preferred pharmacies:    [place] - [place], [place]  Phone: [phone] Fax: [phone]

## 2021-04-13 DIAGNOSIS — Z23 Encounter for immunization: Secondary | ICD-10-CM | POA: Diagnosis not present

## 2021-04-26 DIAGNOSIS — M4727 Other spondylosis with radiculopathy, lumbosacral region: Secondary | ICD-10-CM | POA: Diagnosis not present

## 2021-04-26 DIAGNOSIS — M25561 Pain in right knee: Secondary | ICD-10-CM | POA: Diagnosis not present

## 2021-04-26 DIAGNOSIS — Z79891 Long term (current) use of opiate analgesic: Secondary | ICD-10-CM | POA: Diagnosis not present

## 2021-05-24 DIAGNOSIS — M47817 Spondylosis without myelopathy or radiculopathy, lumbosacral region: Secondary | ICD-10-CM | POA: Diagnosis not present

## 2021-05-24 DIAGNOSIS — M5416 Radiculopathy, lumbar region: Secondary | ICD-10-CM | POA: Diagnosis not present

## 2021-05-24 DIAGNOSIS — M5136 Other intervertebral disc degeneration, lumbar region: Secondary | ICD-10-CM | POA: Diagnosis not present

## 2021-06-21 DIAGNOSIS — M5137 Other intervertebral disc degeneration, lumbosacral region: Secondary | ICD-10-CM | POA: Diagnosis not present

## 2021-06-21 DIAGNOSIS — Z79891 Long term (current) use of opiate analgesic: Secondary | ICD-10-CM | POA: Diagnosis not present

## 2021-06-28 ENCOUNTER — Other Ambulatory Visit (HOSPITAL_BASED_OUTPATIENT_CLINIC_OR_DEPARTMENT_OTHER): Payer: Self-pay | Admitting: Student in an Organized Health Care Education/Training Program

## 2021-06-29 NOTE — Telephone Encounter (Signed)
PER Pharmacy, Samantha Cameron is a 65 year old female has requested a refill of antacid .      Last Office Visit: 02/02/21 with christopher simons   Last Physical Exam: 08/15/2015      Other Med Adult:  Most Recent BP Reading(s)  02/02/21 : 90/60        Cholesterol (mg/dL)   Date Value   05/26/2020 175     LOW DENSITY LIPOPROTEIN DIRECT (mg/dL)   Date Value   05/26/2020 82     HIGH DENSITY LIPOPROTEIN (mg/dL)   Date Value   05/26/2020 61     TRIGLYCERIDES (mg/dL)   Date Value   11/05/2018 229 (H)         THYROID SCREEN TSH REFLEX FT4 (uIU/mL)   Date Value   05/26/2020 2.040         No results found for: TSH    HEMOGLOBIN A1C (%)   Date Value   02/02/2021 6.3 (H)       No results found for: POCA1C      INR (no units)   Date Value   04/18/2017 1.0   05/27/2016 1.0   02/01/2012 < 1.0 (L)       SODIUM (mmol/L)   Date Value   02/02/2021 136       POTASSIUM (mmol/L)   Date Value   02/02/2021 4.0           CREATININE (mg/dL)   Date Value   02/02/2021 0.8       Documented patient preferred pharmacies:    [place] - [address], [place] - [place]  Phone: [phone] Fax: [phone]

## 2021-07-19 DIAGNOSIS — M25561 Pain in right knee: Secondary | ICD-10-CM | POA: Diagnosis not present

## 2021-07-19 DIAGNOSIS — M5416 Radiculopathy, lumbar region: Secondary | ICD-10-CM | POA: Diagnosis not present

## 2021-07-19 DIAGNOSIS — M47817 Spondylosis without myelopathy or radiculopathy, lumbosacral region: Secondary | ICD-10-CM | POA: Diagnosis not present

## 2021-08-09 DIAGNOSIS — M5417 Radiculopathy, lumbosacral region: Secondary | ICD-10-CM | POA: Diagnosis not present

## 2021-08-09 DIAGNOSIS — F40231 Fear of injections and transfusions: Secondary | ICD-10-CM | POA: Diagnosis not present

## 2021-08-16 DIAGNOSIS — M25561 Pain in right knee: Secondary | ICD-10-CM | POA: Diagnosis not present

## 2021-08-16 DIAGNOSIS — Z79891 Long term (current) use of opiate analgesic: Secondary | ICD-10-CM | POA: Diagnosis not present

## 2021-08-17 DIAGNOSIS — M25561 Pain in right knee: Secondary | ICD-10-CM | POA: Diagnosis not present

## 2021-08-18 DIAGNOSIS — Z23 Encounter for immunization: Secondary | ICD-10-CM | POA: Diagnosis not present

## 2021-08-20 ENCOUNTER — Other Ambulatory Visit (HOSPITAL_BASED_OUTPATIENT_CLINIC_OR_DEPARTMENT_OTHER): Payer: Self-pay | Admitting: Cardiovascular Disease

## 2021-08-21 NOTE — Telephone Encounter (Signed)
PER Pharmacy, Samantha Cameron is a 65 year old female has requested a refill of torsemide.      Last Office Visit: 06/09/20 with Newman Nip  Last Physical Exam: 08/15/2015    A1C due on 05/05/2021  DM EYE EXAM due on 07/21/2021    Other Med Adult:  Most Recent BP Reading(s)  02/02/21 : 90/60        Cholesterol (mg/dL)   Date Value   05/26/2020 175     LOW DENSITY LIPOPROTEIN DIRECT (mg/dL)   Date Value   05/26/2020 82     HIGH DENSITY LIPOPROTEIN (mg/dL)   Date Value   05/26/2020 61     TRIGLYCERIDES (mg/dL)   Date Value   11/05/2018 229 (H)         THYROID SCREEN TSH REFLEX FT4 (uIU/mL)   Date Value   05/26/2020 2.040         No results found for: TSH    HEMOGLOBIN A1C (%)   Date Value   02/02/2021 6.3 (H)       No results found for: POCA1C      INR (no units)   Date Value   04/18/2017 1.0   05/27/2016 1.0   02/01/2012 < 1.0 (L)       SODIUM (mmol/L)   Date Value   02/02/2021 136       POTASSIUM (mmol/L)   Date Value   02/02/2021 4.0           CREATININE (mg/dL)   Date Value   02/02/2021 0.8       Documented patient preferred pharmacies:    [place] - [address], [place] - [place]  Phone: [phone] Fax: [phone]

## 2021-08-24 ENCOUNTER — Other Ambulatory Visit (HOSPITAL_BASED_OUTPATIENT_CLINIC_OR_DEPARTMENT_OTHER): Payer: Self-pay | Admitting: Cardiovascular Disease

## 2021-08-24 DIAGNOSIS — M1711 Unilateral primary osteoarthritis, right knee: Secondary | ICD-10-CM | POA: Diagnosis not present

## 2021-08-25 ENCOUNTER — Telehealth (HOSPITAL_BASED_OUTPATIENT_CLINIC_OR_DEPARTMENT_OTHER): Payer: Self-pay

## 2021-08-25 DIAGNOSIS — H25012 Cortical age-related cataract, left eye: Secondary | ICD-10-CM

## 2021-08-25 NOTE — Telephone Encounter (Signed)
Is this referral to a specific provider or location?       If yes, please provide provider name/specialty and/or location:   No    Please describe the clinical question: Annual Eye exam for Cataract       REFERRAL REQUEST- PROVIDER, PLEASE REVIEW AND SIGN ORDER IF APPROPRIATE      HOW IS REFERRAL BEING REQUESTED: phone    WHO IS REFERRAL BEING REQUESTED BY: Patient    REFERRED TO SPECIALTY:  Ophthalmology     DIAGNOSIS/CHIEF COMPLAINT: Cataract annual eye exam     HAVE YOU [employer] PCP FOR THIS ISSUE: no    HAVE YOU SEEN YOUR PCP WITHIN THE LAST YEAR: yes      [facility] ONLY  PROVIDER:  DOS:  NPI:  LOCATION:  PHONE:  FAX:

## 2021-09-13 DIAGNOSIS — M25561 Pain in right knee: Secondary | ICD-10-CM | POA: Diagnosis not present

## 2021-09-13 DIAGNOSIS — Z96659 Presence of unspecified artificial knee joint: Secondary | ICD-10-CM | POA: Diagnosis not present

## 2021-09-14 DIAGNOSIS — M1711 Unilateral primary osteoarthritis, right knee: Secondary | ICD-10-CM | POA: Diagnosis not present

## 2021-09-16 ENCOUNTER — Other Ambulatory Visit (HOSPITAL_BASED_OUTPATIENT_CLINIC_OR_DEPARTMENT_OTHER): Payer: Self-pay | Admitting: Physician Assistant

## 2021-09-16 ENCOUNTER — Other Ambulatory Visit (HOSPITAL_BASED_OUTPATIENT_CLINIC_OR_DEPARTMENT_OTHER): Payer: Self-pay | Admitting: Cardiovascular Disease

## 2021-09-17 NOTE — Telephone Encounter (Signed)
PER Pharmacy, Samantha Cameron is a 65 year old female has requested a refill of Lipitor.      Last Office Visit: 02/02/21 with pcp  Last Physical Exam: 08/15/15    A1C due on 05/05/2021  DM EYE EXAM due on 07/21/2021    Other Med Adult:  Most Recent BP Reading(s)  02/02/21 : 90/60        Cholesterol (mg/dL)   Date Value   05/26/2020 175     LOW DENSITY LIPOPROTEIN DIRECT (mg/dL)   Date Value   05/26/2020 82     HIGH DENSITY LIPOPROTEIN (mg/dL)   Date Value   05/26/2020 61     TRIGLYCERIDES (mg/dL)   Date Value   11/05/2018 229 (H)         THYROID SCREEN TSH REFLEX FT4 (uIU/mL)   Date Value   05/26/2020 2.040         No results found for: TSH    HEMOGLOBIN A1C (%)   Date Value   02/02/2021 6.3 (H)       No results found for: POCA1C      INR (no units)   Date Value   04/18/2017 1.0   05/27/2016 1.0   02/01/2012 < 1.0 (L)       SODIUM (mmol/L)   Date Value   02/02/2021 136       POTASSIUM (mmol/L)   Date Value   02/02/2021 4.0           CREATININE (mg/dL)   Date Value   02/02/2021 0.8       Documented patient preferred pharmacies:    [place] - [place] [address], [place] - [place]  Phone: [phone] Fax: [phone]

## 2021-09-18 NOTE — Telephone Encounter (Signed)
This prescription refill request has passed the Rx Renewal Authorization protocol.  This medication has been approved and sent to the patient's preferred pharmacy.      PER Pharmacy, Samantha Cameron is a 65 year old female has requested a refill of Atorvastatin 20 mg.      Last Office Visit: 02/02/2021 with PCP  Last Physical Exam: 08/15/2015    A1C due on 05/05/2021  DM EYE EXAM due on 07/21/2021    [place] Med:  Lipids   Cholesterol (mg/dL)   Date Value   05/26/2020 175     LOW DENSITY LIPOPROTEIN DIRECT (mg/dL)   Date Value   05/26/2020 82     HIGH DENSITY LIPOPROTEIN (mg/dL)   Date Value   05/26/2020 61     TRIGLYCERIDES (mg/dL)   Date Value   11/05/2018 229 (H)   LFTs   ALANINE AMINOTRANSFERASE (U/L)   Date Value   07/30/2018 32       ASPARTATE AMINOTRANSFERASE (U/L)   Date Value   07/30/2018 21       ALBUMIN (g/dL)   Date Value   07/30/2018 3.7       TOTAL PROTEIN (g/dL)   Date Value   07/30/2018 6.9       BILIRUBIN DIRECT (mg/dl)   Date Value   07/06/2018 0.1       BILIRUBIN TOTAL (mg/dL)   Date Value   07/30/2018 0.2       ALKALINE PHOSPHATASE (U/L)   Date Value   07/30/2018 92       Documented patient preferred pharmacies:    [facility] [place] [address], [place] - [place]  Phone: [phone] Fax: [phone]

## 2021-09-19 ENCOUNTER — Ambulatory Visit (HOSPITAL_BASED_OUTPATIENT_CLINIC_OR_DEPARTMENT_OTHER): Payer: Self-pay | Admitting: Registered Nurse

## 2021-09-19 NOTE — Telephone Encounter (Signed)
Returned call to pt re the following:  symptoms: sinus issues/headache/offered pt a televisit this Friday, pt declined   sick since 3 days ago   What has pt. tried at home:tylenol     Reason for Disposition   Fever present > 3 days (72 hours)    Answer Assessment - Initial Assessment Questions  1. LOCATION: "Where does it hurt?"       Head/forehead  Nose  Upper teeth  2. ONSET: "When did the sinus pain start?"  (e.g., hours, days)       3 days ago  3. SEVERITY: "How bad is the pain?"   (Scale 1-10; mild, moderate or severe)    - MODERATE (4-7): interferes with normal activities (e.g., work or school) or awakens from sleep-rates pain as 5-6  4. RECURRENT SYMPTOM: "Have you ever had sinus problems before?" If so, ask: "When was the last time?" and "What happened that time?"       Yes last year same time  5. NASAL CONGESTION: "Is the nose blocked?" If so, ask, "Can you open it or must you breathe through the mouth?"      yes  6. NASAL DISCHARGE: "Do you have discharge from your nose?" If so ask, "What color?"      Yes clear stuffy nose drainage  7. FEVER: "Do you have a fever?" If so, ask: "What is it, how was it measured, and when did it start?"       Feels warm but no temperature with thermometer  8. OTHER SYMPTOMS: "Do you have any other symptoms?" (e.g., sore throat, cough, earache, difficulty breathing)      no    Protocols used: ADULT SINUS PAIN OR CONGESTION-A-[place]    Priority: Urgent    Advised per nursing triage protocol.  Verbalized understanding and agreement with instructions and disposition.     Recommended disposition for patient:Disposition: See in Office within 3 days   09/21/2021 with Pablo Ledger [place]    Instructed patient to call back for any new, worsening, or worrisome symptoms or concerns any time day or night.

## 2021-09-19 NOTE — Telephone Encounter (Signed)
Regarding: sinus issues/headache  ----- Message from [facility] sent at 09/19/2021  9:33 AM EDT -----  Samantha Cameron [id number], 65 year old, female    Calls today:  Sick    What are the symptoms: sinus issues/headache/offered pt a televisit this Friday, pt declined and request an appt today.  How long has patient been sick?since 3 days ago  What has pt. tried at home:tylenol  Person calling on behalf of patient: Patient (self)    CALL BACK NUMBER: [phone]  Best time to call back: anytime   :    Patient's language of care: English    Patient does not need an interpreter.    Patient's PCP: Driscilla Grammes, MD

## 2021-09-20 NOTE — Progress Notes (Signed)
SUBJECTIVE:  65 year old female presents for Patient presents with:  Chest Congestion  Nasal Congestion  Headache  Ear Problem: Blocked   PCP Driscilla Grammes, MD.     #Sinus pain:   States for 3 days days ago noted to have HA and frontal sinus pain with clear nasal discharge, with nasal congestion.   Then x2 days later started w/ fevers, chills, dry cough, ear fullness and sneezing.  Denies fevers, chills today but continues to have dry cough, sinus pain, ear fullness, and sneezing.   Pain in front of face as a pressure - fearing a sinus inflection.  States has has in past, last was 1 year ago and treated with ABX.   Cough is dry but feels like there could be some chest congestion in her lungs that she cannot get up.   But denies sputum production.   Today denies SOB sittting here today, denies chest pain/palpitations.  Admits to x1 episode of diarrhea which was x2 days ago, loose stool, denies blood or mucous. Bowel movements have returned to normal, last BM yesterday.    Current smoker: 1/2 PPD currently.     Denies sore throat, PND.  Denies SOB, LE edema, dizziness.   Denies myalgia, rashes   Denies vomiting, diarrhea.   Denies sick contact, although lives with grand children but does not think anyone is sick.   Denies recent travel  Vaccine: primary series, no booster. Admits to recent influenza vaccine.     Medication: taking OTC cough medication and APAP with headache.    #BP:  Low BP today in office  Admits that she has not has any water to drink today only coffee.   States table to eat and drink, no changes in appetitive.   Denies dizziness, weakness, fatigue.     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) ([place])     Tobacco use disorder     Spinal stenosis of lumbar region     HLD (hyperlipidemia)     Chronic bilateral low back pain     Pineal gland cyst     History of vitamin D deficiency     Internal hemorrhoids     Venous (peripheral)  insufficiency     Adrenal adenoma     Type 2 diabetes mellitus with microalbuminuria, without long-term current use of insulin (HCC)     Acute pain of right wrist     Abnormal PFTs     Skin irritation     Vertigo     Orthostatic hypotension     Social problem     Right arm weakness     Insomnia     Hx of total knee replacement, left     Seasonal allergic rhinitis     Stress due to family tension     Chronic gastritis     Common bile duct dilation     Fatty liver     Pancreatic lesion     Vascular calcification     Umbilical hernia without obstruction and without gangrene     Scoliosis of thoracic spine     Anxiety     Sleep disturbance     Reflux esophagitis     First degree AV block     OSA (obstructive sleep apnea)     Unintentional weight loss     Fibroid     Endometrial thickening on ultrasound     ASCUS of cervix  with negative high risk HPV     Endometrial polyp     Panic disorder     Adenomatous polyp of colon     Microalbuminuria     Temporomandibular joint disorder     Sensorineural hearing loss (SNHL) of both ears     Cortical senile cataract, left     PAC (premature atrial contraction)     Essential hypertension     SVT (supraventricular tachycardia) (HCC)     DOE (dyspnea on exertion)     Elevated left ventricular end-diastolic pressure (LVEDP)     BMI 39.0-39.9,adult     Family problems     Sinus pressure     Hx of supraventricular tachycardia    REVIEW OF SYSTEMS: negative or noted above    Current Outpatient Medications   Medication Sig    atorvastatin (LIPITOR) 20 MG tablet take 1 tablet by mouth once daily    metoprolol (TOPROL-XL) 25 MG 24 hr tablet take 1 tablet by mouth every morning and at bedtime    torsemide (DEMADEX) 10 MG tablet take 1 tablet by mouth once daily    INCRUSE ELLIPTA 62.5 MCG/INH inhaler inhale 1 puff by mouth and INTO THE LUNGS once daily    metFORMIN (GLUCOPHAGE-XR) 500 MG 24 hr tablet Take 3 tablets by mouth daily with breakfast    lisinopril (ZESTRIL) 5 MG tablet take  1 tablet by mouth once daily WITH DINNER    albuterol (PROVENTIL) (2.5 MG/3ML) 0.083% nebulizer solution Take 3 mLs by nebulization every 6 (six) hours as needed for Wheezing Dx: Code: J44.9    cholecalciferol (VITAMIN D3) 1000 UNIT tablet Take 1 tablet by mouth daily    pregabalin (LYRICA) 100 MG capsule Take 100 mg by mouth as needed With Dr Dwyane Dee         oxycodone-acetaminophen (PERCOCET) 5-325 MG per tablet Take 1 tablet by mouth 2 (two) times daily as needed With Dr Dwyane Dee    aspirin 81 MG tablet Take 81 mg by mouth daily.    guaifenesin 200 MG tablet Take 2 tablets by mouth every 4 (four) hours as needed for Congestion  for up to 5 days    fluticasone (FLONASE) 50 MCG/ACT nasal spray 1 spray by Each Nostril route in the morning.    carbamide peroxide (DEBROX) 6.5 % otic solution Place 5 drops into both ears in the morning and 5 drops before bedtime. Do all this for 4 days.     No current facility-administered medications for this visit.     OBJECTIVE:  BP 91/61 (Site: [place])    Pulse 91    Temp 97.3 F (36.3 C) (Temporal)    Ht 5\' 2"  (1.575 m)    Wt 93.9 kg (207 lb)    LMP 10/22/2007 (LMP Unknown)    SpO2 98%    BMI 37.86 kg/m     Gen: female, in NAD, speaking normally  Head: normocephalic, atraumatic   Ear: external ear without erythema, lesions, swelling, no tragal/mastoid tenderness, unable to visualize TM bilaterally due to cerumen  Eyes: PERRLA, EOMi, conjunctivae are clear  Nose: nares patent, nasal mucosa pink/moist, ITH noted bilaterally, septum midline, + frontal sinus tenderness bilaterally  Throat: Oral mucosa is pink and moist with good dentition. Tongue normal in appearance without lesions and with good symmetrical movement. No buccal nodules or lesions are noted. The pharynx is normal in appearance without tonsillar swelling or exudates, tonsils 1+  CV: regular rate and rhythm, no murmurs/rubs/gallops, no pitting  edema of lower extremities  Pulm: speaking in full sentences, non-labored  breathing, lungs CTAB across all fields, no wheezes/rales/rhonchi  Abd: normoactive bowel sounds, non-distended, no tenderness to palpation  Skin: warm and well perfused  Neuro: Alert and oriented  Psych: Normal speech and thought process    ASSESSMENT AND PLAN:    1. Acute cough  65 y/o F seen today for x3 days of HA, frontal sinus pain, nasal congestion, clear nasal discharge, and dry cough. Patient concerned with bacterial sinus infection, however given 3 days of symptoms and no purulent nasal discharge, and absence of fevers do no suspect at this time. Most likely viral in nature at this time. Patient denies SOB or sputum production reported, no not suspect PNA at this time.   Patient stable on assessment, afebrile.  Will treat with daily Flonase and guaifenesin for help thin mucous and APAP q6 hours PRN for pain, no ABX warranted at this time.   Stressed hydration, rest, and healthy eating - and nasal sinus rinse.   Can consider addition of antihistamine as addition.   Advised to monitor for purulent discharge, new/worsening sinus pain > 7-10 days - as may require ABX at this time.   Will test for COVID-19.  Vaccinated with COVID-19 vaccine with primary series only, no booster.   denies sick contacts or recent illness, travel.   Based on symptoms today will test for COVID-19.    Reviewed current CDC guidelines: Wear a well-fitting mask if you must be around others in your home. Do not travel. End isolation after 5 full days if you are fever-free for 24 hours (without the use of fever-reducing medication) and your symptoms are improving.     If sx progress and worsen causing difficulty breathing/respiratory distress, pt should go to Emergency Room.   Also advised to return to clinic with fevers, chills, sputum production, dyspnea for reassessment.   pt verbalized understanding and agreement w/ plan    - guaifenesin 200 MG tablet; Take 2 tablets by mouth every 4 (four) hours as needed for Congestion  for up to 5  days  Dispense: 30 tablet; Refill: 0  - COVID-19 OUTPATIENT    2. Frontal sinus pain  See above.     - fluticasone (FLONASE) 50 MCG/ACT nasal spray; 1 spray by Each Nostril route in the morning.  Dispense: 1 each; Refill: 0  - COVID-19 OUTPATIENT    3. Bilateral impacted cerumen  On physical exam, bilateral cerumen impaction bilaterally. No tragal/mastoid tenderness, unable to visualize the TM as cerumen obscuring view.   Will treat with debrox x 4 days, then return for possible irrigation.  Reviewed proper use, avoid q-tips.  Patient verbalized understanding and agreement with plan.     - carbamide peroxide (DEBROX) 6.5 % otic solution; Place 5 drops into both ears in the morning and 5 drops before bedtime. Do all this for 4 days.  Dispense: 1 each; Refill: 0    4. Low blood pressure reading  Asymptomatic hypotension today in office. First BP with A[place] 88/58. On repeat by provider BP 91/61 after patient drank x2 cups of water in office.   Admits that she has not drank any water today, denies dizziness, palpitations, nausea.   Does not check BP at home - took medications today.   Stressed importance of adequate hydration: at least 2-3 liters of water per day!   Advised that patient return in 2 weeks for repeat BP check to ensure BP not too low.  If remains low may need to adjust medications, and consider EKG.  Patient verbalized understanding and agreement with plan.     All of the patients questions were answered and they expressed verbal agreement and understanding with all of the above. They were aware that they can call or make an appointment for additional concerns.     We discussed the patients current medications including proper use and potential side effects. The patient expressed understanding and no barriers to adherence were identified.     1. The patient indicates understanding of these issues and agrees with the plan. Brief care plan is updated and reviewed with the patient.   2. The patient is given an  After Visit Summary sheet that lists all medications with directions, allergies, orders placed during this encounter, and follow-up instructions.   3. I reviewed the patient's medical information and medical history   4. I reconciled the patient's medication list and prepared and supplied needed refills.   5. I have reviewed the past medical, family, and social history sections including the medications and allergies.    I spent a total of 30 minutes on this visit on the date of service (total time includes all activities performed on the date of service)    Pablo Ledger, PA-C, 09/21/2021

## 2021-09-21 ENCOUNTER — Other Ambulatory Visit: Payer: Self-pay

## 2021-09-21 ENCOUNTER — Encounter (HOSPITAL_BASED_OUTPATIENT_CLINIC_OR_DEPARTMENT_OTHER): Payer: Self-pay | Admitting: Family Medicine

## 2021-09-21 ENCOUNTER — Telehealth (HOSPITAL_BASED_OUTPATIENT_CLINIC_OR_DEPARTMENT_OTHER): Payer: Self-pay

## 2021-09-21 ENCOUNTER — Ambulatory Visit: Payer: Medicare Other | Attending: Internal Medicine | Admitting: Family Medicine

## 2021-09-21 VITALS — BP 91/61 | HR 91 | Temp 97.3°F | Ht 62.0 in | Wt 207.0 lb

## 2021-09-21 DIAGNOSIS — J3489 Other specified disorders of nose and nasal sinuses: Secondary | ICD-10-CM | POA: Diagnosis present

## 2021-09-21 DIAGNOSIS — R031 Nonspecific low blood-pressure reading: Secondary | ICD-10-CM | POA: Diagnosis present

## 2021-09-21 DIAGNOSIS — R051 Acute cough: Secondary | ICD-10-CM | POA: Insufficient documentation

## 2021-09-21 DIAGNOSIS — H6123 Impacted cerumen, bilateral: Secondary | ICD-10-CM | POA: Diagnosis present

## 2021-09-21 MED ORDER — GUAIFENESIN 200 MG PO TABS
400.00 mg | ORAL_TABLET | ORAL | 0 refills | Status: AC | PRN
Start: 2021-09-21 — End: 2021-09-26

## 2021-09-21 MED ORDER — FLUTICASONE PROPIONATE 50 MCG/ACT NA SUSP
1.00 | Freq: Every day | NASAL | 0 refills | Status: AC
Start: 2021-09-21 — End: 2021-10-21

## 2021-09-21 MED ORDER — CARBAMIDE PEROXIDE 6.5 % OT SOLN
5.00 [drp] | Freq: Two times a day (BID) | OTIC | 0 refills | Status: AC
Start: 2021-09-21 — End: 2021-09-25

## 2021-09-21 NOTE — Patient Instructions (Signed)
Please go to the Emergency Room with any chest pain/palpitations, shortness of breath, dizziness, syncope, fevers, chills, or purulent nasal discharge.

## 2021-09-21 NOTE — Telephone Encounter (Signed)
Called and spoke to patient-- scheduled an in person visit with care team--10/03/21--BP check

## 2021-09-22 LAB — COVID-19 OUTPATIENT: COVID-19 OUTPATIENT: NEGATIVE

## 2021-09-25 ENCOUNTER — Encounter (HOSPITAL_BASED_OUTPATIENT_CLINIC_OR_DEPARTMENT_OTHER): Payer: Self-pay

## 2021-10-03 ENCOUNTER — Other Ambulatory Visit: Payer: Self-pay

## 2021-10-03 ENCOUNTER — Ambulatory Visit
Payer: Medicare Other | Attending: Internal Medicine | Admitting: Student in an Organized Health Care Education/Training Program

## 2021-10-03 ENCOUNTER — Encounter (HOSPITAL_BASED_OUTPATIENT_CLINIC_OR_DEPARTMENT_OTHER): Payer: Self-pay | Admitting: Student in an Organized Health Care Education/Training Program

## 2021-10-03 VITALS — BP 100/62 | HR 86 | Temp 97.8°F | Wt 207.8 lb

## 2021-10-03 DIAGNOSIS — R809 Proteinuria, unspecified: Secondary | ICD-10-CM

## 2021-10-03 DIAGNOSIS — J3489 Other specified disorders of nose and nasal sinuses: Secondary | ICD-10-CM | POA: Diagnosis present

## 2021-10-03 DIAGNOSIS — B9689 Other specified bacterial agents as the cause of diseases classified elsewhere: Secondary | ICD-10-CM | POA: Diagnosis present

## 2021-10-03 DIAGNOSIS — I10 Essential (primary) hypertension: Secondary | ICD-10-CM | POA: Diagnosis present

## 2021-10-03 DIAGNOSIS — J019 Acute sinusitis, unspecified: Secondary | ICD-10-CM | POA: Diagnosis present

## 2021-10-03 DIAGNOSIS — H6123 Impacted cerumen, bilateral: Secondary | ICD-10-CM | POA: Insufficient documentation

## 2021-10-03 DIAGNOSIS — I471 Supraventricular tachycardia, unspecified: Secondary | ICD-10-CM

## 2021-10-03 DIAGNOSIS — R031 Nonspecific low blood-pressure reading: Secondary | ICD-10-CM | POA: Diagnosis present

## 2021-10-03 DIAGNOSIS — E1129 Type 2 diabetes mellitus with other diabetic kidney complication: Secondary | ICD-10-CM | POA: Insufficient documentation

## 2021-10-03 LAB — MICROALBUMIN RANDOM URINE
ALB/CREAT RATIO URINE RAN: 294 ug/mg — ABNORMAL HIGH (ref 0–30)
ALBUMIN URINE RANDOM: 3.9 mg/dL — ABNORMAL HIGH (ref 0.0–1.9)
CREATININE RANDOM URINE: 13 mg/dL — ABNORMAL LOW (ref 28–217)

## 2021-10-03 LAB — LIPID PANEL
Cholesterol: 162 mg/dL (ref 0–239)
HIGH DENSITY LIPOPROTEIN: 53 mg/dL (ref 40–60)
LOW DENSITY LIPOPROTEIN DIRECT: 72 mg/dL (ref 0–189)
TRIGLYCERIDES: 217 mg/dL — ABNORMAL HIGH (ref 0–150)

## 2021-10-03 LAB — HEMOGLOBIN A1C
ESTIMATED AVERAGE GLUCOSE: 140 mg/dL (ref 74–160)
HEMOGLOBIN A1C: 6.5 % — ABNORMAL HIGH (ref 4.0–5.6)

## 2021-10-03 MED ORDER — METOPROLOL SUCCINATE ER 25 MG PO TB24
25.0000 mg | ORAL_TABLET | Freq: Every day | ORAL | 0 refills | Status: DC
Start: 2021-10-03 — End: 2022-07-25

## 2021-10-03 MED ORDER — AMOXICILLIN-POT CLAVULANATE 875-125 MG PO TABS
1.00 | ORAL_TABLET | Freq: Two times a day (BID) | ORAL | 0 refills | Status: AC
Start: 2021-10-03 — End: 2021-10-10

## 2021-10-03 NOTE — Assessment & Plan Note (Signed)
>>  ASSESSMENT AND PLAN FOR SVT (SUPRAVENTRICULAR TACHYCARDIA) WRITTEN ON 10/03/2021  2:39 PM BY Genny Caulder I, PA-C    Hx of SVT. Previously followed by cardiology (Dr. Shon Baton) has not been evaluated by specialist for >1 year however. Denies any palpitations. Current regimen includes metoprolol 25 mg (was prescribed as BID, but per patient only using once daily). Agrees to continue once daily given, symptoms are well controlled at this time. Agrees to reconnect with cardiology. Updated referral placed. Return precautions discussed with patient. Patient verbalizes understanding and is in agreement with plan.

## 2021-10-03 NOTE — Assessment & Plan Note (Signed)
Hx of SVT. Previously followed by cardiology (Dr. [name]) has not been evaluated by specialist for >1 year however. Denies any palpitations. Current regimen includes metoprolol 25 mg (was prescribed as BID, but per patient only using once daily). Agrees to continue once daily given, symptoms are well controlled at this time. Agrees to reconnect with cardiology. Updated referral placed. Return precautions discussed with patient. Patient verbalizes understanding and is in agreement with plan.

## 2021-10-03 NOTE — Assessment & Plan Note (Signed)
Last A1c at goal (6.3) in March. A1c and microalbumin obtained today. Current medications include metformin 1500 mg once daily. Return precautions discussed with patient. Patient verbalizes understanding and is in agreement with plan.

## 2021-10-03 NOTE — Assessment & Plan Note (Signed)
Previously followed by cardiology (Dr. [name]). Has not been evaluated for >1 year. On chart review, she was suppose to be using metoprolol 25 BID and lisinopril 5. However, patient reports she has only been using metoprolol once daily. Given BP today, agrees to continue once daily. Return precautions discussed with patient. Patient verbalizes understanding and is in agreement with plan.

## 2021-10-03 NOTE — Patient Instructions (Signed)
[name],     So nice meeting you.     Blood pressure is better today! I'm glad you told me you are only using the metoprolol once a day.     I'm doing some labs for you.     I think the sinus pain/congestion does need an antibiotic at this time.     We sent the augmentin for you to use twice a day for 7.     Elmyra Ricks

## 2021-10-03 NOTE — Progress Notes (Signed)
SUBJECTIVE:    Samantha Cameron a 65 year old female patient of Driscilla Grammes, MD presents for low BP      HPI:  #low BP  -discussed last visit 11/3  -BP was low at last visit in setting of inadequate hydration    -on chart review, baseline BP for patient per cardiology is around 100/62  -last visit with cardiology was >1 year ago (Dr. Lanice Schwab)    -current medications include metoprolol 25 mg --was written for BID--she is only using it once a daily however and lisinopril 5 mg      Most Recent BP Reading(s)  10/03/21 : 100/62  09/21/21 : 91/61  02/02/21 : 90/60  10/31/20 : 121/82  08/18/20 : 110/66      -hydration: 1 large bottle throughout the day/night      -denies SOB, dizziness, fatigue, palpitations    REVIEW OF SYSTEMS: negative or noted above    Current Outpatient Medications   Medication Sig    amoxicillin-clavulanate (AUGMENTIN) 875-125 MG per tablet Take 1 tablet by mouth in the morning and 1 tablet before bedtime. Do all this for 7 days.    metoprolol (TOPROL-XL) 25 MG 24 hr tablet Take 1 tablet by mouth in the morning.    fluticasone (FLONASE) 50 MCG/ACT nasal spray 1 spray by Each Nostril route in the morning.    atorvastatin (LIPITOR) 20 MG tablet take 1 tablet by mouth once daily    torsemide (DEMADEX) 10 MG tablet take 1 tablet by mouth once daily    INCRUSE ELLIPTA 62.5 MCG/INH inhaler inhale 1 puff by mouth and INTO THE LUNGS once daily    metFORMIN (GLUCOPHAGE-XR) 500 MG 24 hr tablet Take 3 tablets by mouth daily with breakfast    lisinopril (ZESTRIL) 5 MG tablet take 1 tablet by mouth once daily WITH DINNER    albuterol (PROVENTIL) (2.5 MG/3ML) 0.083% nebulizer solution Take 3 mLs by nebulization every 6 (six) hours as needed for Wheezing Dx: Code: J44.9    cholecalciferol (VITAMIN D3) 1000 UNIT tablet Take 1 tablet by mouth daily    pregabalin (LYRICA) 100 MG capsule Take 100 mg by mouth as needed With Dr Dwyane Dee         oxycodone-acetaminophen (PERCOCET) 5-325 MG per tablet Take  1 tablet by mouth 2 (two) times daily as needed With Dr Dwyane Dee    aspirin 81 MG tablet Take 81 mg by mouth daily.     No current facility-administered medications for this visit.       OBJECTIVE:  BP 100/62 (Site: RA, Position: Sitting, Cuff Size: Lrg)    Pulse 86    Temp 97.8 F (36.6 C) (Temporal)    Wt 94.3 kg (207 lb 12.8 oz)    LMP 10/22/2007 (LMP Unknown)    SpO2 96%    BMI 38.01 kg/m   Gen: Well-appearing, speaking normally  HEENT: Head atraumatic, TMs unable to be visualized entirely d/t excessive cerumen bilaterally, nares patent, nasal mucosa pink, ITH noted bilaterally, +frontal/maxillary sinus tenderness, XT:GGYIR, moist, normal conjunctiva  CV: regular rate and rhythm, no murmurs/rubs/gallops  Pulm: No increased work of breathing, lungs clear to auscultation, no wheezes/rales/rhonchi  Skin: warm and well perfused  Neuro: Alert and oriented, no gross focal motor deficits  Psych: Normal speech and thought process      *HEENT post ear irrigation: TMs visualized with good cone of light bilaterally. Left EAC with some erythema post irrigation, but no edema, pain to suggest infection  LABS/IMAGING:  09/21/2021 Covid  Component      Latest Ref Rng & Units 09/21/2021   COVID-19 OUTPATIENT       Negative for SARS-CoV-2(2019 novel coronavirus)by [facility] . . .     02/02/2021 A1c  Component      Latest Ref Rng & Units 02/02/2021   HEMOGLOBIN A1C      4.0 - 5.6 % 6.3 (H)   ESTIMATED AVERAGE GLUCOSE      74 - 160 mg/dL 134     ASSESSMENT AND PLAN:  1. Low blood pressure reading  65 year old female with hx of asymptomatic low BP at last visit 11/3. Asymptomatic. BP at baseline today on chart review. Patient is only taking metoprolol once daily instead of BID. Med list updated. Given BP is on lower side, once daily is preferred at this time. Return precautions discussed with patient. Patient verbalizes understanding and is in agreement with plan.    2. Acute bacterial sinusitis  3. Frontal sinus pain  History and exam  most c/w bacterial sinusitis given >10 days of symptoms at this point. Agrees to trial of augmentin. SE reviewed. Return precautions discussed with patient. Patient verbalizes understanding and is in agreement with plan.  - amoxicillin-clavulanate (AUGMENTIN) 875-125 MG per tablet; Take 1 tablet by mouth in the morning and 1 tablet before bedtime. Do all this for 7 days.  Dispense: 14 tablet; Refill: 0    Essential hypertension  Previously followed by cardiology (Dr. [name]). Has not been evaluated for >1 year. On chart review, she was suppose to be using metoprolol 25 BID and lisinopril 5. However, patient reports she has only been using metoprolol once daily. Given BP today, agrees to continue once daily. Return precautions discussed with patient. Patient verbalizes understanding and is in agreement with plan.      SVT (supraventricular tachycardia) (HCC)  Hx of SVT. Previously followed by cardiology (Dr. [name]) has not been evaluated by specialist for >1 year however. Denies any palpitations. Current regimen includes metoprolol 25 mg (was prescribed as BID, but per patient only using once daily). Agrees to continue once daily given, symptoms are well controlled at this time. Agrees to reconnect with cardiology. Updated referral placed. Return precautions discussed with patient. Patient verbalizes understanding and is in agreement with plan.      Type 2 diabetes mellitus with microalbuminuria, without long-term current use of insulin (HCC)  Last A1c at goal (6.3) in March. A1c and microalbumin obtained today. Current medications include metformin 1500 mg once daily. Return precautions discussed with patient. Patient verbalizes understanding and is in agreement with plan.        8. Excessive cerumen in both ear canals  Was trialed on debrox last visit. Still with excessive cerumen. Ear irrigation performed by nursing.           We discussed the patients current medications. The patient expressed understanding and  no barriers to adherence were identified.   1. The patient indicates understanding of these issues and agrees with the plan. Brief care plan is updated and reviewed with the patient.   2. The patient is given an After Visit Summary sheet that lists all medications with directions, allergies, orders placed during this encounter, and follow-up instructions.   3. I reviewed the patient's medical information and medical history   4. I reconciled the patient's medication list and prepared and supplied needed refills.   5. I have reviewed the past medical, family, and social history sections including  the medications and allergies.  Phil Dopp, PA-C

## 2021-11-08 DIAGNOSIS — M4727 Other spondylosis with radiculopathy, lumbosacral region: Secondary | ICD-10-CM | POA: Diagnosis not present

## 2021-11-08 DIAGNOSIS — Z79891 Long term (current) use of opiate analgesic: Secondary | ICD-10-CM | POA: Diagnosis not present

## 2021-11-08 DIAGNOSIS — M48062 Spinal stenosis, lumbar region with neurogenic claudication: Secondary | ICD-10-CM | POA: Diagnosis not present

## 2021-11-22 ENCOUNTER — Other Ambulatory Visit (HOSPITAL_BASED_OUTPATIENT_CLINIC_OR_DEPARTMENT_OTHER): Payer: Self-pay | Admitting: Clinical Cardiac Electrophysiology

## 2021-11-23 NOTE — Telephone Encounter (Signed)
PER Pharmacy, Samantha Cameron is a 66 year old female has requested a refill of lisinopril 5.      Last Office Visit: 08-01-20 with erica brooks  Last Physical Exam: 08-15-15    DM EYE EXAM due on 07/21/2021  COLONOSCOPY due on 01/20/2022    HTN Med:    Most Recent BP Reading(s)  10/03/21 : 100/62  09/21/21 : 91/61  02/02/21 : 90/60      Documented patient preferred pharmacies:    [id number] - [place], [place]  Phone: [phone] Fax: [phone]

## 2021-12-13 DIAGNOSIS — M5416 Radiculopathy, lumbar region: Secondary | ICD-10-CM | POA: Diagnosis not present

## 2021-12-26 ENCOUNTER — Ambulatory Visit: Payer: Medicare Other | Attending: Internal Medicine | Admitting: Family Medicine

## 2021-12-26 ENCOUNTER — Ambulatory Visit (HOSPITAL_BASED_OUTPATIENT_CLINIC_OR_DEPARTMENT_OTHER): Payer: Self-pay | Admitting: Registered Nurse

## 2021-12-26 ENCOUNTER — Encounter (HOSPITAL_BASED_OUTPATIENT_CLINIC_OR_DEPARTMENT_OTHER): Payer: Self-pay | Admitting: Family Medicine

## 2021-12-26 DIAGNOSIS — J3489 Other specified disorders of nose and nasal sinuses: Secondary | ICD-10-CM | POA: Diagnosis present

## 2021-12-26 MED ORDER — LORATADINE 10 MG PO TABS
10.00 mg | ORAL_TABLET | Freq: Every day | ORAL | 0 refills | Status: AC
Start: 2021-12-26 — End: 2021-12-31

## 2021-12-26 MED ORDER — GUAIFENESIN ER 600 MG PO TB12
600.00 mg | ORAL_TABLET | Freq: Two times a day (BID) | ORAL | 0 refills | Status: AC
Start: 2021-12-26 — End: 2021-12-31

## 2021-12-26 MED ORDER — FLUTICASONE PROPIONATE 50 MCG/ACT NA SUSP
1.00 | Freq: Every day | NASAL | 0 refills | Status: AC
Start: 2021-12-26 — End: 2022-01-25

## 2021-12-26 NOTE — Progress Notes (Signed)
SUBJECTIVE:  66 year old female seen today via televisit for sore throat. PCP is Driscilla Grammes, MD.     Reports having sore throat and sinus pressure x4 days   Located on temples and across face  Admits that she has been sneezing   Admits to nasal congestion with clear nasal discharge  Feels has to breath through her mouth   Has been using flonase and APAP for HA.    Denies cough  Denies chest pain/pressure, palpitations, SOB, dizziness.  Denies fevers, chills  Denies nausea or vomiting    States grandson was sick with sore throat  Denies recent travel.     Vaccination: vaccinated for covid-19    REVIEW OF SYSTEMS: negative or noted above    Current Outpatient Medications   Medication Sig    lisinopril (ZESTRIL) 5 MG tablet take 1 tablet by mouth once daily WITH DINNER    metoprolol (TOPROL-XL) 25 MG 24 hr tablet Take 1 tablet by mouth in the morning.    atorvastatin (LIPITOR) 20 MG tablet take 1 tablet by mouth once daily    torsemide (DEMADEX) 10 MG tablet take 1 tablet by mouth once daily    INCRUSE ELLIPTA 62.5 MCG/INH inhaler inhale 1 puff by mouth and INTO THE LUNGS once daily    metFORMIN (GLUCOPHAGE-XR) 500 MG 24 hr tablet Take 3 tablets by mouth daily with breakfast    albuterol (PROVENTIL) (2.5 MG/3ML) 0.083% nebulizer solution Take 3 mLs by nebulization every 6 (six) hours as needed for Wheezing Dx: Code: J44.9    cholecalciferol (VITAMIN D3) 1000 UNIT tablet Take 1 tablet by mouth daily    pregabalin (LYRICA) 100 MG capsule Take 100 mg by mouth as needed With Dr Dwyane Dee         oxycodone-acetaminophen (PERCOCET) 5-325 MG per tablet Take 1 tablet by mouth 2 (two) times daily as needed With Dr Dwyane Dee    aspirin 81 MG tablet Take 81 mg by mouth daily.    guaiFENesin (MUCINEX) 600 MG 12 hr tablet Take 1 tablet by mouth in the morning and 1 tablet before bedtime. Do all this for 5 days.    loratadine (CLARITIN) 10 MG tablet Take 1 tablet by mouth in the morning for 5 days.    fluticasone  (FLONASE) 50 MCG/ACT nasal spray 1 spray by Each Nostril route in the morning.     No current facility-administered medications for this visit.       OBJECTIVE:  Vitals were not obtain as patient was seen via telehealth  Normal thought process and content.  Speaking normally in full sentences. Non-labored breathing.    ASSESSMENT AND PLAN:    1. Sinus pressure  x4 days of nasal congestion w/ clear nasal discharge, HA, and sneezing. Reports grandson with similar symptoms. Has been using APAP and flonase since onset with mild relief. Has not yet taken a covid-19 test.   Seen via telehealth, unable to perform physical exam but speaking in full sentences, non-labored breathing.  Given symptoms began x4 days ago, and reported clear nasal discharge do not suspect bacterial sinusitis but rather viral URI.  Will treat symptomatically with flonase daily, mucinex 600 mg BID, and loratadine 10 mg QD.  Reviewed doing and possible side effects.  Recommended patient self test with at home covid-19 testing - and alerts PCP if positive for possible treatment.  Also reviewed if sxs worsen, or new fevers, chills, purulent sputum she should be seen in person for re-evaluation.  Patient  verbalized understanding and agreement with plan.     - guaiFENesin (MUCINEX) 600 MG 12 hr tablet; Take 1 tablet by mouth in the morning and 1 tablet before bedtime. Do all this for 5 days.  Dispense: 10 tablet; Refill: 0  - loratadine (CLARITIN) 10 MG tablet; Take 1 tablet by mouth in the morning for 5 days.  Dispense: 5 tablet; Refill: 0  - fluticasone (FLONASE) 50 MCG/ACT nasal spray; 1 spray by Each Nostril route in the morning.  Dispense: 30 each; Refill: 0    All of the patients questions were answered and they expressed verbal agreement and understanding with all of the above. They were aware that they can call or make an appointment for additional concerns.     I spent a total of 22 minutes on this visit on the date of service (total time  includes all activities performed on the date of service)    Pablo Ledger, PA-C 12/26/2021

## 2021-12-26 NOTE — Telephone Encounter (Signed)
Reason for Disposition   SEVERE sinus pain    Answer Assessment - Initial Assessment Questions  1. LOCATION: "Where does it hurt?"       Temple across face  2. ONSET: "When did the sinus pain start?"  (e.g., hours, days)       Started this past sat  3. SEVERITY: "How bad is the pain?"   (Scale 1-10; mild, moderate or severe)    - MILD (1-3): doesn't interfere with normal activities     - MODERATE (4-7): interferes with normal activities (e.g., work or school) or awakens from sleep    - SEVERE (8-10): excruciating pain and patient unable to do any normal activities         8  4. RECURRENT SYMPTOM: "Have you ever had sinus problems before?" If so, ask: "When was the last time?" and "What happened that time?"       Yes- sinus infection in past- got abx- went to   5. NASAL CONGESTION: "Is the nose blocked?" If so, ask, "Can you open it or must you breathe through the mouth?"    Has to breath through mouth  6. NASAL DISCHARGE: "Do you have discharge from your nose?" If so ask, "What color?"      clear  7. FEVER: "Do you have a fever?" If so, ask: "What is it, how was it measured, and when did it start?"       no  8. OTHER SYMPTOMS: "Do you have any other symptoms?" (e.g., sore throat, cough, earache, difficulty breathing)      headache    Protocols used: ADULT SINUS PAIN AND CONGESTION-A-[place]

## 2021-12-26 NOTE — Telephone Encounter (Signed)
Regarding: Sore Throat/Sinus Pain  ----- Message from Estevan Ryder sent at 12/26/2021  8:55 AM EST -----  Samantha Cameron [id number], 66 year old, female    Calls today:  Sick    What are the symptoms pt having sore throat /Sinus pain  How long has patient been sick? 3 days  What has pt. tried at home n/a    Person calling on behalf of patient: Patient (self)    Patient's language of care: English    Patient does not need an interpreter.    Patient's PCP: Driscilla Grammes, MD

## 2021-12-28 ENCOUNTER — Encounter (HOSPITAL_BASED_OUTPATIENT_CLINIC_OR_DEPARTMENT_OTHER): Payer: Self-pay | Admitting: Ophthalmology

## 2021-12-28 ENCOUNTER — Other Ambulatory Visit: Payer: Self-pay

## 2021-12-28 ENCOUNTER — Ambulatory Visit: Payer: Medicare Other | Attending: Ophthalmology | Admitting: Ophthalmology

## 2021-12-28 DIAGNOSIS — R809 Proteinuria, unspecified: Secondary | ICD-10-CM | POA: Diagnosis present

## 2021-12-28 DIAGNOSIS — H5213 Myopia, bilateral: Secondary | ICD-10-CM

## 2021-12-28 DIAGNOSIS — H25811 Combined forms of age-related cataract, right eye: Secondary | ICD-10-CM | POA: Diagnosis present

## 2021-12-28 DIAGNOSIS — H52203 Unspecified astigmatism, bilateral: Secondary | ICD-10-CM

## 2021-12-28 DIAGNOSIS — E1129 Type 2 diabetes mellitus with other diabetic kidney complication: Secondary | ICD-10-CM | POA: Diagnosis present

## 2021-12-28 DIAGNOSIS — H524 Presbyopia: Secondary | ICD-10-CM

## 2021-12-28 NOTE — Progress Notes (Signed)
Here for Diabetic Annual Eye Examination,H/O DM-no retinopathy,CAtaract OD,Pseudophakia OS(03/09/2020),Myopia,astigmatism,Presbyopia.  Macular OCT and Fundus photo done today OU.   Vision is blurry OD.   Cannot drive at night.   No pain.   No flashes/floaters.  HEMOGLOBIN A1C (%)   Date Value   10/03/2021 6.5 (H)   02/02/2021 6.3 (H)   05/26/2020 6.1 (H)       No results found for: POCA1C     Ocular meds   None    Want to schedule cataract surgery OD

## 2021-12-28 NOTE — Progress Notes (Signed)
For diabetic eye exam to evaluate for retinopathy    HEMOGLOBIN A1C (%)   Date Value   10/03/2021 6.5 (H)   02/02/2021 6.3 (H)   05/26/2020 6.1 (H)       No results found for: POCA1C    Impression,    1. AODM no gross retinopathy-      2. Cataract Right-      Samantha Cameron was seen at the [facility] for an eye exam.    The patient has noticed decreasing vision in her right eye.    The patient was examined and found to a visually significant cataract in the right eye.    He  was having difficulty seeing for her activities of daily living.    The risk, benefits, and alternatives of cataract surgery have been discussed with the patient.    He  would like to have cataract surgery on the right eye.  The patient will be returning for preoperative eye measurements and seeing her PCP for preoperative medical clearance.    Lens options including "premium," toric, and standard monovision intraocular lens options were discussed at length.    Has SN60WF +16.5 OS      3. Pseudophakia OS

## 2022-01-04 ENCOUNTER — Ambulatory Visit (HOSPITAL_BASED_OUTPATIENT_CLINIC_OR_DEPARTMENT_OTHER): Payer: Medicare Other | Admitting: Internal Medicine

## 2022-01-04 DIAGNOSIS — Z79891 Long term (current) use of opiate analgesic: Secondary | ICD-10-CM | POA: Diagnosis not present

## 2022-01-04 DIAGNOSIS — M4727 Other spondylosis with radiculopathy, lumbosacral region: Secondary | ICD-10-CM | POA: Diagnosis not present

## 2022-01-12 ENCOUNTER — Encounter (HOSPITAL_BASED_OUTPATIENT_CLINIC_OR_DEPARTMENT_OTHER): Payer: Self-pay | Admitting: Family Medicine

## 2022-01-12 ENCOUNTER — Other Ambulatory Visit: Payer: Self-pay

## 2022-01-12 ENCOUNTER — Ambulatory Visit: Payer: Medicare Other | Attending: Family Medicine | Admitting: Family Medicine

## 2022-01-12 VITALS — BP 96/68 | HR 96 | Temp 96.9°F | Wt 208.0 lb

## 2022-01-12 DIAGNOSIS — I10 Essential (primary) hypertension: Secondary | ICD-10-CM

## 2022-01-12 DIAGNOSIS — M25562 Pain in left knee: Secondary | ICD-10-CM

## 2022-01-12 DIAGNOSIS — Z96652 Presence of left artificial knee joint: Secondary | ICD-10-CM

## 2022-01-12 DIAGNOSIS — I5032 Chronic diastolic (congestive) heart failure: Secondary | ICD-10-CM | POA: Diagnosis present

## 2022-01-12 DIAGNOSIS — I471 Supraventricular tachycardia, unspecified: Secondary | ICD-10-CM

## 2022-01-12 DIAGNOSIS — R9430 Abnormal result of cardiovascular function study, unspecified: Secondary | ICD-10-CM | POA: Diagnosis present

## 2022-01-12 DIAGNOSIS — Z01818 Encounter for other preprocedural examination: Secondary | ICD-10-CM

## 2022-01-12 DIAGNOSIS — H25811 Combined forms of age-related cataract, right eye: Secondary | ICD-10-CM

## 2022-01-12 DIAGNOSIS — J441 Chronic obstructive pulmonary disease with (acute) exacerbation: Secondary | ICD-10-CM

## 2022-01-12 MED ORDER — PREDNISONE 50 MG PO TABS
50.00 mg | ORAL_TABLET | Freq: Every day | ORAL | 0 refills | Status: AC
Start: 2022-01-12 — End: 2022-01-17

## 2022-01-12 MED ORDER — ALBUTEROL SULFATE HFA 108 (90 BASE) MCG/ACT IN AERS
2.0000 | INHALATION_SPRAY | Freq: Four times a day (QID) | RESPIRATORY_TRACT | 1 refills | Status: DC | PRN
Start: 2022-01-12 — End: 2022-11-22

## 2022-01-12 NOTE — Progress Notes (Signed)
Subjective     Samantha Cameron is a 66 year old female presents for pre-op exam for R eye cataract.     SVT: No further palpitations.     Takes the torsemide daily. She gets out of breath when she walks a lot. She is able to walk up a flight of stairs and gets out a breath but doesn't have to stop due to shortness of breath or chest pain. No edema. Doesn't typically lie flat due to weight. Isn't sure why she is on the torsemide.     COPD:   Uses incruse ellipta "when I need it" hasn't been needing it. Doesn't have a rescue inhaler. Has been feeling like she has a cough she can't get rid of, like there is phlegm she can't cough up. Denies dyspnea on exertion.     Most Recent BP Reading(s)  01/12/22 : 96/68  10/03/21 : 100/62  09/21/21 : 91/61  02/02/21 : 90/60  10/31/20 : 121/82      ROS: No fevers or weight loss. No headaches, double or blurry vision. No chest pain or palpitations. No nausea, vomiting or abdominal pain. No dysuria, hematuria. No swelling in extremities. No rashes.    Social History     Socioeconomic History    Marital status: Divorced     Spouse name: Not on file    Number of children: Not on file    Years of education: Not on file    Highest education level: Not on file   Occupational History    Not on file   Tobacco Use    Smoking status: Every Day     Packs/day: 0.50     Years: 31.00     Pack years: 15.50     Types: Cigarettes     Last attempt to quit: 03/10/2018     Years since quitting: 3.8    Smokeless tobacco: Never    Tobacco comments:     quit smoking inform provided to patient   Substance and Sexual Activity    Alcohol use: No    Drug use: No    Sexual activity: Not Currently     Partners: Male     Birth control/protection: Tubal Ligation   Other Topics Concern    Not on file   Social History Narrative    Lives with son    1 daughter and another son passed away    Disabled from back    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 Simons,MD, 07/15/2015, 2:56 PM        Social Determinants of Health  Financial Resource Strain: Not on file  Food Insecurity: Not on file  Transportation Needs: Not on file  Physical Activity: Not on file  Stress: Not on file  Social Connections: Not on file  Intimate Partner Violence: Not on file  Housing Stability: Not on file  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) ([place])     Tobacco use disorder     Spinal stenosis of lumbar region     HLD (hyperlipidemia)     Chronic bilateral low back pain  Pineal gland cyst     History of vitamin D deficiency     Internal hemorrhoids     Venous (peripheral) insufficiency     Adrenal adenoma     Type 2 diabetes mellitus with microalbuminuria, without long-term current use of insulin (HCC)     Acute pain of right wrist     Abnormal PFTs     Skin irritation     Vertigo     Orthostatic hypotension     Social problem     Right arm weakness     Insomnia     Hx of total knee replacement, left     Seasonal allergic rhinitis     Stress due to family tension     Chronic gastritis     Common bile duct dilation     Fatty liver     Pancreatic lesion     Vascular calcification     Umbilical hernia without obstruction and without gangrene     Scoliosis of thoracic spine     Anxiety     Sleep disturbance     Reflux esophagitis     First degree AV block     OSA (obstructive sleep apnea)     Unintentional weight loss     Fibroid     Endometrial thickening on ultrasound     ASCUS of cervix with negative high risk HPV     Endometrial polyp     Panic disorder     Adenomatous polyp of colon     Microalbuminuria     Temporomandibular joint disorder     Sensorineural hearing loss (SNHL) of both ears     Cortical senile cataract, left     PAC (premature atrial contraction)      Essential hypertension     SVT (supraventricular tachycardia) (HCC)     DOE (dyspnea on exertion)     Elevated left ventricular end-diastolic pressure (LVEDP)     BMI 39.0-39.9,adult     Family problems     Sinus pressure     Combined forms of age-related cataract of right eye    Past Surgical History:  No date: FOOT SURGERY      Comment:  bilateral  No date: LAPAROSCOPY SURG CHOLECYSTECTOMY  No date: OB ANTEPARTUM CARE CESAREAN DLVR & POSTPARTUM      Comment:  x3  No date: PR ANES NERVE MUSC TENDON FASCIA&BURSA KNEE&/POPLT  No date: TONSILLECTOMY & ADENOIDECTOMY <AGE 49  04/13/2009: TOTAL KNEE REPLACEMENT      Comment:  left  08/11/2009: WRIST GANGLION EXCISION      Comment:  left   Review of patient's family history indicates:  Problem: Diabetes      Relation: Mother          Age of Onset: (Not Specified)  Problem: Heart      Relation: Brother          Age of Onset: (Not Specified)          Comment: CAD and AAA  Problem: Hypertension      Relation: Mother          Age of Onset: (Not Specified)  Problem: Arthritis      Relation: Mother          Age of Onset: (Not Specified)  Problem: Stroke      Relation: Mother          Age of Onset: (Not Specified)  Problem: Diabetes      Relation: Brother  Age of Onset: (Not Specified)          Comment: same brother  Problem: Cancer - Other      Relation: Father          Age of Onset: (Not Specified)          Comment: type uncertain  Problem: Thyroid      Relation: Sister          Age of Onset: (Not Specified)  Problem: Alcohol/Drug Abuse      Relation: Son          Age of Onset: (Not Specified)  Problem: OTHER      Relation: Son          Age of Onset: (Not Specified)          Comment: ?brain aneurysm per steward records  Problem: No Known Problems      Relation: Grandchild          Age of Onset: (Not Specified)          Comment: 6  Problem: Cancer - Breast      Relation: FamHxNeg          Age of Onset: (Not Specified)  Problem: Cancer - Cervical      Relation:  FamHxNeg          Age of Onset: (Not Specified)  Problem: Cancer - Ovarian      Relation: FamHxNeg          Age of Onset: (Not Specified)  Problem: Cancer - Colon      Relation: FamHxNeg          Age of Onset: (Not Specified)  Problem: Psychiatric Illness      Relation: FamHxNeg          Age of Onset: (Not Specified)    fluticasone (FLONASE) 50 MCG/ACT nasal spray, 1 spray by Each Nostril route in the morning., Disp: 30 each, Rfl: 0  lisinopril (ZESTRIL) 5 MG tablet, take 1 tablet by mouth once daily WITH DINNER, Disp: 90 tablet, Rfl: 3  metoprolol (TOPROL-XL) 25 MG 24 hr tablet, Take 1 tablet by mouth in the morning., Disp: 180 tablet, Rfl: 0  atorvastatin (LIPITOR) 20 MG tablet, take 1 tablet by mouth once daily, Disp: 90 tablet, Rfl: 3  torsemide (DEMADEX) 10 MG tablet, take 1 tablet by mouth once daily, Disp: 90 tablet, Rfl: 3  INCRUSE ELLIPTA 62.5 MCG/INH inhaler, inhale 1 puff by mouth and INTO THE LUNGS once daily, Disp: 90 each, Rfl: 3  metFORMIN (GLUCOPHAGE-XR) 500 MG 24 hr tablet, Take 3 tablets by mouth daily with breakfast, Disp: 360 tablet, Rfl: 3  albuterol (PROVENTIL) (2.5 MG/3ML) 0.083% nebulizer solution, Take 3 mLs by nebulization every 6 (six) hours as needed for Wheezing Dx: Code: J44.9, Disp: 75 mL, Rfl: 1  cholecalciferol (VITAMIN D3) 1000 UNIT tablet, Take 1 tablet by mouth daily, Disp: , Rfl:   pregabalin (LYRICA) 100 MG capsule, Take 100 mg by mouth as needed With Dr Dwyane Dee     , Disp: , Rfl:   oxycodone-acetaminophen (PERCOCET) 5-325 MG per tablet, Take 1 tablet by mouth 2 (two) times daily as needed With Dr Dwyane Dee, Disp: , Rfl:   aspirin 81 MG tablet, Take 81 mg by mouth daily., Disp: , Rfl:     No current facility-administered medications for this visit.    Review of Patient's Allergies indicates:   Codeine camsylate       Shortness of Breath, Other (See  Comments)    Comment:Chest pain   Motrin [ibuprofen]      Nausea Only   Sulfa antibiotics       Other (See  Comments)   Bactrim                 Rash, Itching           Objective     BP 96/68    Pulse 96    Temp 96.9 F (36.1 C) (Temporal)    Wt 94.3 kg (208 lb)    LMP 10/22/2007 (LMP Unknown)    SpO2 96%    BMI 38.04 kg/m       Physical Exam:  Gen: Normal Appearance, no acute distress  Psych: cooperative pleasant  Skin: warm dry  CARD: RRR, no M/R/G  Pulm: no respiratory distress, poor air movement, expiratory wheeze  Neuro: nl station and gait, non-focal exam normal  Ext: 2+ PT pulses, no edema            Assessment   Assessment and Plan:      1. Combined forms of age-related cataract of right eye  Evaluated for pre-op consult. Cataract surgery is a low risk surgery and pt is not at risk for adverse cardiac event following this procedure, however she did have other uncontrolled medical conditions as below. Plan for now to reassess in one week. If doing well, can write letter to clear for surgery.     2. SVT (supraventricular tachycardia) ([place])  Refererd to cards. Pt on metoprolol 25mg , plus torsemide lisinopril, BPs low, probably need to balance goal BP with minimizing palpitations. Pt denies palpitations at present.   - REFERRAL TO CARDIOLOGY ([facility])    3. COPD with acute exacerbation (HCC)  Pred burst x 5d, albuterol inhaler, f/u in one week to assess progress     4. Essential hypertension  Bps low, see below, reducted torsemide to 5mg  daily.   - REFERRAL TO CARDIOLOGY ([facility])    5. Hx of total knee replacement, left  6. Acute pain of left knee  Referring to PT    7. Preoperative examination  Patient with normal exam and without cardiac history. She is at low risk for a cardiac event during this low risk surgery and does not require further risk stratification.  Plan:        Would like to see improvement in COPD and BPs prior to procedure    - EKG    8. 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 Elevated left ventricular end-diastolic pressure (LVEDP)  BPs are very low and pt didn't really understand why  she was on the torsemide 10mg , denies edema. She did have Grade one diastolic dysunction on TTE 02/2020, EF 65%, per prior cards notes had mildly elevated filling pressures on  cath, placed on torsemide 10mg  with improvement of breathing, referring back to cards for further assessment   For now trial of decreasing torsemide to 5mg  daily from 10mg , f/u in one week to assess vital signs. If worsening shortness of breath, edema, advised to call and restart full dose of torsemide 10mg    [name], MD, 01/12/2022  - REFERRAL TO CARDIOLOGY ([facility])    I spent a total of 42 minutes on this visit on the date of service (total time includes all activities performed on the date of service)             [name], MD

## 2022-01-15 ENCOUNTER — Encounter (HOSPITAL_BASED_OUTPATIENT_CLINIC_OR_DEPARTMENT_OTHER): Payer: Self-pay | Admitting: Ophthalmology

## 2022-01-15 LAB — EKG

## 2022-01-17 NOTE — Addendum Note (Signed)
Addended by: Clemetine Marker on: 01/17/2022 07:37 AM     Modules accepted: Orders

## 2022-01-18 LAB — EKG

## 2022-01-19 ENCOUNTER — Encounter (HOSPITAL_BASED_OUTPATIENT_CLINIC_OR_DEPARTMENT_OTHER): Payer: Self-pay

## 2022-01-19 NOTE — Discharge Instructions (Addendum)
SURGICAL DAY CARE PRE-OPERATIVE INSTRUCTIONS    DAY OF SURGERY    Arrive at: [facility] on Wednesday, March  8 at (Time): 10:00 am.     You must have a responsible adult available to accompany you home after surgery. (We suggest that you have someone available to assist you at home after your surgery).    INSTRUCTIONS:     You may have nothing to eat or drink after midnight the night before your surgery, not even water, gum or hard candy.     You may not smoke the morning of your surgery.    Please leave all valuables at home, including jewelry, watches, money, etc.    Please remove all fingernail polish before arriving at [facility] and do not  wear any face or lip make-up.    If you are having eye surgery, do not wear any eye or face makeup and avoid facial moisturizers and perfumes.    Please take out any hair extensions that can be removed.    Do not shave surgical site.    Do not wear contact lenses.    MEDICATIONS:     Take the following medication the night before surgery at bedtime: usual medicines     Take the following medication the morning of your surgery with only a sip of water: usual morning medicines      hold metformin until after surgery

## 2022-01-19 NOTE — Surgery Pre-Op (Signed)
PAT Visit          Samantha Cameron is a 66 year old female received a preoperative screening.        Appointments for Next 30 Days 01/19/2022 - 02/18/2022      Date Visit Type Department Provider     01/22/2022 10:40 AM ESTABLISHED PATIENT [facility] P. [name], MD    Patient Instructions:                  01/22/2022 11:15 AM PRE-ADMISSION [facility] PAT RN    Patient Instructions:     On this date, you will receive a phone call from a [facility] nurse to discuss your upcoming procedure. You do not need to come to the hospital for this appointment               01/25/2022  9:30 AM POST OP [place] Charise Carwin, MD    Patient Instructions:                  02/01/2022 10:30 AM POST OP [place], [place]    Patient Instructions:                  02/12/2022  1:00 PM OFF30 [place], [place]    Patient Instructions:                   Surgery Information     Future Procedures (Tomorrow to 01/19/2023)       Date Time Procedures w/ Laterality Providers Location Status        01/24/2022 1135 CATARACT SURGERY WITH PC IOL (PHACO) - Right Charise Carwin [facility] OR Scheduled      Procedures w/ Laterality: CATARACT SURGERY WITH PC IOL (PHACO) - Right                      Procedure(s):  CATARACT SURGERY WITH PC IOL (PHACO)  01/24/2022        .     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  1. GERD - severe 2.                Unintentional weight loss 3. Abdominal pain   Plan: 1.                Recommend EGD. This procedure has been fully reviewed                with the patient and written informed consent has been                obtained. 2.   No date: Esophageal reflux  No date: External hemorrhoid      Comment:  per steward records  04/19/2017: Gastritis without bleeding      Comment:  GI endoscopy: 06/2018: Mildly severe reflux esophagitis.               Biopsied.      - Chronic gastritis. Biopsied.      - No                gross lesions in the entire examined duodenum.                Complications:  No immediate complications.                Recommendation:      - Await pathology results. 7/2019GI:               Assessment: Pt is 32F with PMH COPD, morbid obesity,                H.pylori (2009, unclear if treated) who p/w severe GERD                symptoms, unintentional weigh  05/10/2018: Gastroesophageal reflux disease without esophagitis  No date: HTN (hypertension)  02/01/2012: Hypoxia  02/16/2017: Left ear pain  06/07/2015: Left genital labial abscess  06/16/2015: Obesity, Class III, 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y) ([place])  08/16/2015: Osteopenia       Comment:  On xray of left knee per steward records 06/2014 Unclear                if addressed  05/23/2018: Pancreatic lesion  09/14/2015: Right ear impacted cerumen  No date: Sleep apnea  08/15/2015: Type 2 diabetes mellitus without complication, without   long-term current use of insulin (HCC)      Comment:   HEMOGLOBIN A1C (%) Date Value 07/15/2015 6.2 (H)                ----------  Steward record - a1c 6.2 on 01/31/15 6.4 on                09/30/14 5.9 on 12/29/13 6.4 08/18/13   11/18/2017: Viral URI with cough     has a past surgical history that includes LAPS SURG CHOLECSTC; [place] FSCA&BRS KNEE&/POP AREA; TONSILLECTOMY&ADENOIDECTOMY UNDER AGE 4; TOTAL KNEE REPLACEMENT (04/13/2009); OB ANTEPARTUM CARE C DLVR&POSTPARTUM; Foot surgery; Wrist  ganglion excision (08/11/2009); and CHOLECSTC.         Alcohol, Tobacco and Drug History:  Social History    Tobacco Use      Smoking status: Every Day        Packs/day: 0.50        Years: 31.00        Pack years: 15.5        Types: Cigarettes        Last attempt to quit: 03/10/2018        Years since quitting: 3.8      Smokeless tobacco: Never      Tobacco comments: quit smoking inform provided to patient    E-Cigarette/Vaping  E-Cigarette Use:   Quit Date:   Nicotine:   Start Date:   THC:    Cartridges/Day:   CBD:   Other Substance:   Flavoring:       Alcohol use No       Drug use: No       Extended Emergency Contact Information  Primary Emergency Contact: Cassio,Gina  Address: [address] [place], [place] [address]  Home Phone: [phone]  Relation: Daughter  Secondary Emergency Contact: Ocasio,Erik  Address: [address] [place], [place] [address]  Mobile Phone: [phone]  Relation: Son        Current Outpatient Medications   Medication Sig    albuterol HFA 108 (90 Base) MCG/ACT inhaler Inhale 2 puffs into the lungs every 6 (six) hours as needed for Wheezing    fluticasone (FLONASE) 50 MCG/ACT nasal spray 1 spray by Each Nostril route in the  morning.    lisinopril (ZESTRIL) 5 MG tablet take 1 tablet by mouth once daily WITH DINNER    metoprolol (TOPROL-XL) 25 MG 24 hr tablet Take 1 tablet by mouth in the morning.    atorvastatin (LIPITOR) 20 MG tablet take 1 tablet by mouth once daily    torsemide (DEMADEX) 10 MG tablet take 1 tablet by mouth once daily    INCRUSE ELLIPTA 62.5 MCG/INH inhaler inhale 1 puff by mouth and INTO THE LUNGS once daily    metFORMIN (GLUCOPHAGE-XR) 500 MG 24 hr tablet Take 3 tablets by mouth daily with breakfast    albuterol (PROVENTIL) (2.5 MG/3ML) 0.083% nebulizer solution Take 3 mLs by nebulization every 6 (six) hours as needed for Wheezing Dx: Code: J44.9    cholecalciferol (VITAMIN D3) 1000 UNIT tablet Take 1 tablet by mouth daily    pregabalin (LYRICA) 100 MG capsule Take 100 mg by mouth as needed With Dr Dwyane Dee         oxycodone-acetaminophen (PERCOCET) 5-325 MG per tablet Take 1 tablet by mouth 2 (two) times daily as needed With Dr Dwyane Dee    aspirin 81 MG tablet Take 81 mg by mouth daily.     No current facility-administered medications for this encounter.         (Not in a hospital admission)      PAT Assessment    Airways WDL     Activity tolerance (How many flights of stairs): sob with stairs  npo cp   with stairs    Assistive Device: None           PAT Screening     Row Name 01/19/22 1529       Integumentary - Complete full head to toe skin assessment  Integumentary (WDL) WDL    Row Name 01/19/22 1529       STOP-Bang Questionaire     Do you snore loudly (loud enough to be heard through closed doors or   your bed-partner elbows you for snoring at night? 1    Do you often feel Tired, fatigued, or Sleepy during the daytime (such as   falling asleep when driving)? 1    Has anyone observed you stop breathing, or choking/gasping during your   sleep? 1    Do you have, or are being treated for, High Blood Pressure? 1    Height 5\' 2"  (1.575 m)    Weight 94.3 kg (208 lb)    BMI (Calculated) 38.12    Neck size large?  0    Stop-Bang Score High                       01/19/22  1529   Weight: 94.3 kg (208 lb)   Height: 5\' 2"  (1.575 m)           Patient issues currently are         Phone screen interview completed with patient, all meds and pre op instructions reviewed with expressed understanding.       Donalda Ewings RN

## 2022-01-19 NOTE — Progress Notes (Signed)
Reviewed during visit. Unchanged from prior, abnormal EKG, referred to Cards,

## 2022-01-19 NOTE — Progress Notes (Signed)
Reviewed during visit. Unchanged from prior, abnormal EKG, referred to Cards

## 2022-01-22 ENCOUNTER — Ambulatory Visit: Payer: Medicare Other | Attending: Internal Medicine | Admitting: Internal Medicine

## 2022-01-22 ENCOUNTER — Ambulatory Visit
Admission: RE | Admit: 2022-01-22 | Discharge: 2022-01-22 | Disposition: A | Discharge status: Home / Self Care | Payer: Medicare Other

## 2022-01-22 ENCOUNTER — Other Ambulatory Visit: Payer: Self-pay

## 2022-01-22 VITALS — BP 100/68 | HR 106 | Temp 97.4°F | Wt 207.9 lb

## 2022-01-22 DIAGNOSIS — I5032 Chronic diastolic (congestive) heart failure: Secondary | ICD-10-CM | POA: Diagnosis present

## 2022-01-22 DIAGNOSIS — R809 Proteinuria, unspecified: Secondary | ICD-10-CM | POA: Diagnosis present

## 2022-01-22 DIAGNOSIS — K2930 Chronic superficial gastritis without bleeding: Secondary | ICD-10-CM | POA: Insufficient documentation

## 2022-01-22 DIAGNOSIS — Z6838 Body mass index (BMI) 38.0-38.9, adult: Secondary | ICD-10-CM | POA: Insufficient documentation

## 2022-01-22 DIAGNOSIS — E1129 Type 2 diabetes mellitus with other diabetic kidney complication: Secondary | ICD-10-CM | POA: Diagnosis present

## 2022-01-22 DIAGNOSIS — K2100 Gastro-esophageal reflux disease with esophagitis, without bleeding: Secondary | ICD-10-CM | POA: Diagnosis present

## 2022-01-22 DIAGNOSIS — H25011 Cortical age-related cataract, right eye: Secondary | ICD-10-CM | POA: Diagnosis present

## 2022-01-22 DIAGNOSIS — I10 Essential (primary) hypertension: Secondary | ICD-10-CM | POA: Diagnosis present

## 2022-01-22 DIAGNOSIS — R9430 Abnormal result of cardiovascular function study, unspecified: Secondary | ICD-10-CM | POA: Insufficient documentation

## 2022-01-22 DIAGNOSIS — J4489 Other specified chronic obstructive pulmonary disease: Secondary | ICD-10-CM

## 2022-01-22 DIAGNOSIS — J449 Chronic obstructive pulmonary disease, unspecified: Secondary | ICD-10-CM | POA: Diagnosis present

## 2022-01-22 MED ORDER — OMEPRAZOLE 20 MG PO CPDR
20.0000 mg | DELAYED_RELEASE_CAPSULE | Freq: Every day | ORAL | 2 refills | Status: DC
Start: 2022-01-22 — End: 2022-04-19

## 2022-01-22 MED ORDER — ALUM & MAG HYDROXIDE-SIMETH 200-200-20 MG/5ML PO SUSP
30.0000 mL | Freq: Four times a day (QID) | ORAL | 7 refills | Status: DC | PRN
Start: 2022-01-22 — End: 2023-04-10

## 2022-01-22 NOTE — Progress Notes (Signed)
Samantha Cameron is a 66 year old female here for pre-op     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) ([place])     Tobacco use disorder     Spinal stenosis of lumbar region     HLD (hyperlipidemia)     Chronic bilateral low back pain     Pineal gland cyst     History of vitamin D deficiency     Internal hemorrhoids     Venous (peripheral) insufficiency     Adrenal adenoma     Type 2 diabetes mellitus with microalbuminuria, without long-term current use of insulin (HCC)     Acute pain of right wrist     Abnormal PFTs     Skin irritation     Vertigo     Orthostatic hypotension     Social problem     Right arm weakness     Insomnia     Hx of total knee replacement, left     Seasonal allergic rhinitis     Stress due to family tension     Chronic gastritis     Common bile duct dilation     Fatty liver     Pancreatic lesion     Vascular calcification     Umbilical hernia without obstruction and without gangrene     Scoliosis of thoracic spine     Anxiety     Sleep disturbance     Reflux esophagitis     First degree AV block     OSA (obstructive sleep apnea)     Unintentional weight loss     Fibroid     Endometrial thickening on ultrasound     ASCUS of cervix with negative high risk HPV     Endometrial polyp     Panic disorder     Adenomatous polyp of colon     Microalbuminuria     Temporomandibular joint disorder     Sensorineural hearing loss (SNHL) of both ears     Cortical senile cataract, left     PAC (premature atrial contraction)     Essential hypertension     SVT (supraventricular tachycardia) (HCC)     DOE (dyspnea on exertion)     Elevated left ventricular end-diastolic pressure (LVEDP)     BMI 39.0-39.9,adult     Family problems     Sinus pressure     Combined forms of age-related cataract of right eye     Diastolic dysfunction with chronic heart failure ([place])    Pre op eval again today  Right eye surgery booked Wed  Had left eye surgery 2 years  ago    COPD  Improved from last visit with exacerbation treatment  Feels back to normal    No dizziness  No CP, SOB  Taking lower torsemide dose - '5mg'$  daily  Most Recent BP Reading(s)  01/22/22 : 100/68  01/12/22 : 96/68  10/03/21 : 100/62  09/21/21 : 91/61  02/02/21 : 90/60    Booked with cardiology 3/27  No CP, palpitations    Eating more salads  More walking - daily 30 min  Most Recent Weight Reading(s)  01/22/22 : 94.3 kg (207 lb 14.4 oz)  01/19/22 : 94.3 kg (208 lb)  01/12/22 : 94.3 kg (208 lb)  10/03/21 : 94.3 kg (207 lb 12.8 oz)  09/21/21 : 93.9 kg (207 lb)    Asking for omeprazole, antacid refills for indigestion  Taking omeprazole daily  No reflux currently  Last egd 2019 - mildly severe reflux esophagitis, chronic gastritis - no H pylori  Last colonoscopy 2020 - sessile polyps, internal hemorrhoids - tubular adenoma on path    CREATININE (mg/dL)   Date Value   02/02/2021 0.8   08/16/2020 0.8   08/01/2020 0.7     HEMOGLOBIN A1C (%)   Date Value   10/03/2021 6.5 (H)   02/02/2021 6.3 (H)   05/26/2020 6.1 (H)       PE:  BP 100/68    Pulse 106    Temp 97.4 F (36.3 C) (Temporal)    Wt 94.3 kg (207 lb 14.4 oz)    LMP 10/22/2007 (LMP Unknown)    SpO2 96%    BMI 38.03 kg/m   Estimated body mass index is 38.03 kg/m as calculated from the following:    Height as of 01/19/22: '5\' 2"'$  (1.575 m).    Weight as of this encounter: 94.3 kg (207 lb 14.4 oz).  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  Discussed prior cardiac eval at length with patient to clarify stage 1 diastolic dysfunction, reason for meds etc  Plan: Cleared for low risk cataract surgery  Continue current meds, follow-up with cardiology in March    (K29.30) Chronic superficial gastritis without bleeding  (K21.00) Gastroesophageal reflux disease with esophagitis without hemorrhage  Comment: patient asking for refills, likely due for follow-up endoscopy and colonoscopy  Plan: Readdress after surgery as above

## 2022-01-23 NOTE — Progress Notes (Signed)
Reviewed during visit. Referred to cards

## 2022-01-24 ENCOUNTER — Ambulatory Visit (HOSPITAL_BASED_OUTPATIENT_CLINIC_OR_DEPARTMENT_OTHER): Payer: Medicare Other | Admitting: Anesthesiology

## 2022-01-24 ENCOUNTER — Ambulatory Visit
Admission: RE | Admit: 2022-01-24 | Discharge: 2022-01-24 | Disposition: A | Discharge status: Home / Self Care | Payer: Medicare Other | Attending: Ophthalmology | Admitting: Ophthalmology

## 2022-01-24 ENCOUNTER — Encounter (HOSPITAL_BASED_OUTPATIENT_CLINIC_OR_DEPARTMENT_OTHER): Payer: Self-pay | Admitting: Ophthalmology

## 2022-01-24 ENCOUNTER — Ambulatory Visit (HOSPITAL_BASED_OUTPATIENT_CLINIC_OR_DEPARTMENT_OTHER)
Admission: RE | Disposition: A | Discharge status: Home / Self Care | Payer: Self-pay | Source: Ambulatory Visit | Attending: Ophthalmology

## 2022-01-24 DIAGNOSIS — G4733 Obstructive sleep apnea (adult) (pediatric): Secondary | ICD-10-CM | POA: Diagnosis not present

## 2022-01-24 DIAGNOSIS — K219 Gastro-esophageal reflux disease without esophagitis: Secondary | ICD-10-CM | POA: Diagnosis present

## 2022-01-24 DIAGNOSIS — H25811 Combined forms of age-related cataract, right eye: Secondary | ICD-10-CM | POA: Diagnosis present

## 2022-01-24 DIAGNOSIS — I10 Essential (primary) hypertension: Secondary | ICD-10-CM | POA: Diagnosis present

## 2022-01-24 DIAGNOSIS — E119 Type 2 diabetes mellitus without complications: Secondary | ICD-10-CM | POA: Diagnosis present

## 2022-01-24 DIAGNOSIS — H25011 Cortical age-related cataract, right eye: Secondary | ICD-10-CM | POA: Insufficient documentation

## 2022-01-24 LAB — BLOOD SUGAR FINGERSTICK (POINT OF CARE): FINGERSTICK GLUCOSE: 100 mg/dl (ref 74–160)

## 2022-01-24 SURGERY — PHACOEMULSIFICATION, CATARACT, WITH POSTERIOR CHAMBER IOL INSERTION
Anesthesia: Monitor Anesthesia Care | Site: Eye | Laterality: Right | Wound class: Class I/ Clean

## 2022-01-24 MED ORDER — ACETAMINOPHEN 325 MG PO TABS
650.0000 mg | ORAL_TABLET | Freq: Four times a day (QID) | ORAL | Status: DC | PRN
Start: 2022-01-24 — End: 2022-01-26

## 2022-01-24 MED ORDER — CEFAZOLIN SODIUM 1 G IJ SOLR
INTRAMUSCULAR | Status: AC
Start: 2022-01-24 — End: 2022-01-24
  Administered 2022-01-24: 50 mg via OPHTHALMIC
  Filled 2022-01-24: qty 10

## 2022-01-24 MED ORDER — FLURBIPROFEN SODIUM 0.03 % OP SOLN
1.0000 [drp] | OPHTHALMIC | Status: AC
Start: 2022-01-24 — End: 2022-01-24
  Administered 2022-01-24 (×3): 1 [drp] via OPHTHALMIC

## 2022-01-24 MED ORDER — CYCLOPENTOLATE HCL 1 % OP SOLN
1.0000 [drp] | OPHTHALMIC | Status: AC
Start: 2022-01-24 — End: 2022-01-24
  Administered 2022-01-24 (×3): 1 [drp] via OPHTHALMIC

## 2022-01-24 MED ORDER — BUPIVACAINE HCL (PF) 0.75 % IJ SOLN
INTRAMUSCULAR | Status: DC
Start: 2022-01-24 — End: 2022-01-24
  Filled 2022-01-24: qty 10

## 2022-01-24 MED ORDER — BSS IO SOLN
Freq: Once | INTRAOCULAR | Status: AC
Start: 2022-01-24 — End: 2022-01-24
  Filled 2022-01-24: qty 0.5

## 2022-01-24 MED ORDER — PHENYLEPHRINE HCL 2.5 % OP SOLN
1.0000 [drp] | OPHTHALMIC | Status: AC
Start: 2022-01-24 — End: 2022-01-24
  Administered 2022-01-24 (×3): 1 [drp] via OPHTHALMIC

## 2022-01-24 MED ORDER — LIDOCAINE-EPINEPHRINE 2 %-1:200000 IJ SOLN
INTRAMUSCULAR | Status: DC
Start: 2022-01-24 — End: 2022-01-24
  Filled 2022-01-24: qty 10

## 2022-01-24 MED ORDER — DEXAMETHASONE SODIUM PHOSPHATE 4 MG/ML IJ SOLN
INTRAMUSCULAR | Status: AC
Start: 2022-01-24 — End: 2022-01-24
  Administered 2022-01-24: 2 mg
  Filled 2022-01-24: qty 1

## 2022-01-24 MED ORDER — ERYTHROMYCIN 5 MG/GM OP OINT
0.50 [in_us] | TOPICAL_OINTMENT | Freq: Once | OPHTHALMIC | Status: AC
Start: 2022-01-24 — End: 2022-01-24
  Administered 2022-01-24: 0.5 [in_us] via OPHTHALMIC

## 2022-01-24 MED ORDER — TROPICAMIDE 1 % OP SOLN
1.0000 [drp] | OPHTHALMIC | Status: AC
Start: 2022-01-24 — End: 2022-01-24
  Administered 2022-01-24 (×3): 1 [drp] via OPHTHALMIC

## 2022-01-24 MED ORDER — PREDNISOLONE ACETATE 1 % OP SUSP
1.0000 [drp] | Freq: Four times a day (QID) | OPHTHALMIC | Status: DC
Start: 2022-01-24 — End: 2022-01-26
  Administered 2022-01-24: 1 [drp] via OPHTHALMIC

## 2022-01-24 MED ORDER — TETRACAINE HCL 0.5 % OP SOLN
OPHTHALMIC | Status: AC
Start: 2022-01-24 — End: 2022-01-24
  Administered 2022-01-24: 1 via OPHTHALMIC
  Filled 2022-01-24: qty 4

## 2022-01-24 MED ORDER — BSS IO SOLN
INTRAOCULAR | Status: AC
Start: 2022-01-24 — End: 2022-01-24
  Administered 2022-01-24: 1
  Filled 2022-01-24: qty 15

## 2022-01-24 MED ORDER — LIDOCAINE HCL (PF) 1 % IJ SOLN
INTRAMUSCULAR | Status: AC
Start: 2022-01-24 — End: 2022-01-24
  Administered 2022-01-24: 1 mL via OPHTHALMIC
  Filled 2022-01-24: qty 5

## 2022-01-24 MED ORDER — MOXIFLOXACIN HCL 0.5 % OP SOLN
1.0000 [drp] | OPHTHALMIC | Status: AC
Start: 2022-01-24 — End: 2022-01-24
  Administered 2022-01-24 (×3): 1 [drp] via OPHTHALMIC

## 2022-01-24 MED ORDER — ACETYLCHOLINE CHLORIDE 20 MG IO SOLR
INTRAOCULAR | Status: DC
Start: 2022-01-24 — End: 2022-01-24
  Filled 2022-01-24: qty 2

## 2022-01-24 SURGICAL SUPPLY — 14 items
ALCON EYE PACK W/BSS SOLUTION (PACK) ×2 IMPLANT
ANTERIOR CHAMBER CANNULA 27GA (OPHTH) ×2 IMPLANT
BETADINE 5% PREP SOLUTION (PREP) ×2 IMPLANT
DRESSING SPONGE 4X4 (DRESSING) ×4 IMPLANT
EXTENSION SET MACROBORE (IVSETS) ×2 IMPLANT
I/A HANDPIECE 45D SILICONE (OPHINST) ×2 IMPLANT
I/O LENS +15.5 SN60WF ×2 IMPLANT
INSYTE IV CATHETER 20GAX1.16IN (IVCATH) ×2 IMPLANT
MONARCH III IOL INSERTER CART (LENSACC) ×2 IMPLANT
NASAL CANNULA W/CO2 SAMPLING (CANNULA) ×2 IMPLANT
PACK EYE (PACK) ×2 IMPLANT
SLIT KNIFE 2.8MM ANGLD SGL BVL (OPHSURG) ×2 IMPLANT
STERILE WATER IRR. 500ML (SOLUTION) ×2 IMPLANT
SURGEON GLOVE LF/PF 8 STER (GLOVE) ×2 IMPLANT

## 2022-01-24 NOTE — Op Note (Signed)
PREOPERATIVE DIAGNOSIS:    Cataract right eye.    POSTOPERATIVE DIAGNOSIS:        Same.    Date of Procedure-                                                      01/24/2022     OPERATION:                                                Clear corneal cataract extraction with                                                                  posterior chamber intraocular lens                      model [id number]                                                                 +15.5 diopters  right    eye.    SURGEON: Wenda Low. Sheilla Maris, MD    ANESTHESIA:                     Intracameral and topical    COMPLICATIONS:                           None.    After a Time Out to identify the correct operative eye, the right eye was prepped and draped in the usual manner appropriate for intraocular surgery.  The operating microscope was used for good surgical control at all times.   A paracentis was performed with a super blade.  Preservative free lidocaine was injected into the anterior chamber.  A viscoelastic material was then injected into the anterior chamber to deepen it.  A clear corneal temporal wound was created using a 2.8 mm keratome in a triplanar fashion approximately 1.5 mm from the limbus. A continuous curvilinear  capsulorrhexis was performed using a bent-needle cystotome and capsulorrhexis forceps.  Phacoemulsification of the nucleus was performed using a quartering-and-cracking technique. Automated irrigation/aspiration of the residual cortical material was then performed.  The intraocular lens was then injected into the capsular bag. Automated irrigation/aspiration of  residual viscoelastic material was then performed.  Balanced salt solution was used to inflate the anterior chamber.  The wound was tested and found to be watertight.  A Subtenon injections with 50 mg Kefzol and 2 mg dexamethasone were administered inferiorly, well away from the wound.  A drop of Vigamox was placed on the surface of the globe, as  well of a ribbon of Ilotycin ointment.  A Sterile shield was placed over the eye and secured with paper tape.  The patient was returned to the ambulatory Day Surgery Unit in good conditions.  The patient was instructed to start taking Vigamox and Prednisolone eye drops in the post operative eye starting 4 hours after the surgery. The patient was instructed to put the shield back on over the eye after putting in the drops and to make sure it is secured with tape.    The patient was also instructed to follow up with Dr. Lonia Chimera the following day and to call the office and have him paged if there are any concerns at any time.

## 2022-01-24 NOTE — Discharge Instructions (Signed)
You need to be seen at the  [facility] Tomorrow.  The office is located at [address], [place].  The phone number is [phone] if you have any questions.     Start taking Vigamox and Pred forte one drop of each in the eye that had surgery today  4 time per day starting TODAY after dinner (around 5 PM)     .  Use both drops after lunch, dinner, before bed, and after breakfast.  Put the shield back over your eye between taking the drops and secure with a piece of tape    Call the office  [phone] if you have any problems of concerns.  The answering service will answer the call 24 hours per day.  Ask them to page Dr. Elai Vanwyk

## 2022-01-24 NOTE — Anesthesia Preprocedure Evaluation (Signed)
Pre-Anesthetic Note  .      Patient: Samantha Cameron is a 66 year old female      Procedure Information     Date/Time: 01/24/22 1030    Procedure: CATARACT SURGERY WITH PC IOL (PHACO) (Right)    Diagnosis: Combined forms of age-related cataract of right eye [H25.811]    Pre-op diagnosis: Combined forms of age-related cataract of right eye [H25.811]    Location: [facility] OR 6 / [place] OR    Surgeons: Charise Carwin, MD          Relevant Problems   No relevant active problems           Previous Anesthetic History:   Past Surgical History:  No date: CHOLECYSTECTOMY  No date: FOOT SURGERY      Comment:  bilateral  No date: LAPAROSCOPY SURG CHOLECYSTECTOMY  No date: OB ANTEPARTUM CARE CESAREAN DLVR & POSTPARTUM      Comment:  x3  No date: PR ANES NERVE MUSC TENDON FASCIA&BURSA KNEE&/POPLT  No date: TONSILLECTOMY & ADENOIDECTOMY <AGE 54  04/13/2009: TOTAL KNEE REPLACEMENT      Comment:  left  08/11/2009: WRIST GANGLION EXCISION      Comment:  left         Medications  Current Facility-Administered Medications   Medication    EPINEPHrine (ADRENALIN) 0.5 mg in balanced salts (BSS) 500 mL irrigation    lidocaine 2%-EPINEPHrine 1:200,000 2 %-1:200000 injection    Acetylcholine Chloride (MIOCHOL-E) 20 MG intra-ocular injection    dexamethasone (DECADRON) 4 MG/ML injection    tetracaine (PONTOCAINE) 0.5 % ophthalmic solution    ceFAZolin (ANCEF) 1 g injection    lidocaine (PF) 1 % injection    bupivacaine (MARCAINE) 0.75 % injection    balanced salts (BSS) ophthalmic solution         Allergies:   Review of Patient's Allergies indicates:   Codeine camsylate       Shortness of Breath, Other (See                            Comments)    Comment:Chest pain   Motrin [ibuprofen]      Nausea Only   Sulfa antibiotics       Other (See Comments)   Bactrim                 Rash, Itching    Smoking, Alcohol, Drugs:  Social History    Tobacco Use      Smoking status: Every Day        Packs/day: 0.50        Years: 31.00        Pack years:  15.5        Types: Cigarettes        Last attempt to quit: 03/10/2018        Years since quitting: 3.8      Smokeless tobacco: Never      Tobacco comments: quit smoking inform provided to patient    Alcohol use: No      Drug use: No       PMHx:  Past Medical History:  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  1. GERD - severe 2.                Unintentional weight loss 3. Abdominal pain   Plan: 1.                Recommend EGD. This procedure has been fully reviewed                with the patient and written informed consent has been                obtained. 2.   No date: Esophageal reflux  No date: External hemorrhoid      Comment:  per steward records  04/19/2017: Gastritis without bleeding      Comment:  GI endoscopy: 06/2018: Mildly severe reflux esophagitis.               Biopsied.      - Chronic gastritis. Biopsied.      - No                gross  lesions in the entire examined duodenum.                Complications:  No immediate complications.                Recommendation:      - Await pathology results. 7/2019GI:               Assessment: Pt is 18F with PMH COPD, morbid obesity,                H.pylori (2009, unclear if treated) who p/w severe GERD                symptoms, unintentional weigh  05/10/2018: Gastroesophageal reflux disease without esophagitis  No date: HTN (hypertension)  02/01/2012: Hypoxia  02/16/2017: Left ear pain  06/07/2015: Left genital labial abscess  06/16/2015: Obesity, Class III, 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y) ([place])  08/16/2015: Osteopenia      Comment:  On xray of left knee per steward records 06/2014 Unclear                if addressed  05/23/2018: Pancreatic lesion  09/14/2015: Right ear impacted cerumen  No date: Sleep apnea  08/15/2015: Type 2 diabetes mellitus without complication, without   long-term current use of insulin (HCC)      Comment:   HEMOGLOBIN A1C (%) Date Value 07/15/2015 6.2 (H)                ----------  Steward record - a1c 6.2 on 01/31/15 6.4 on                09/30/14 5.9 on 12/29/13 6.4 08/18/13   11/18/2017: Viral URI with cough    Vitals  BP 99/67    Pulse 87    Temp 98.2 F (36.8 C) (Temporal)    Resp 19    Ht _0  (1.575 m)    Wt 95.3 kg (210 lb)    LMP 10/22/2007 (LMP Unknown)    SpO2 99%    BMI 38.41 kg/m     Review of Systems        Anesthetic History:   negative anesthesia history ROS           Cardiovascular: Positive for hypercholesterolemia and hypertension. Negative for chest pain and acute myocardial infarction.   Pulmonary: Positive for obstructive sleep apnea, chronic bronchitis and COPD.   GU/Renal: Negative for GU/renal diseases.    Hepatic: Skin negative for hepatic disease.    Neurological: Negative for seizures and strokes.   Gastrointestinal: Positive for GERD.   Hematological: Negative for hematological diseases.    Endocrine: Positive for Diabetes.       Physical Exam    General     Level of  consciousness:  Alert and oriented (time, person, place)   BMI   BMI greater than 30 kg/m2   Airway     Mallampati:  II    TM distance:  >3 FB    Mouth opening:  >3 FB   Teeth     Heart      Lungs                Pertinent Labs:   Lab Results   Component Value Date    NA 136 02/02/2021    K 4.0 02/02/2021    CREAT 0.8 02/02/2021    GLUCOSER 103 02/02/2021    WBC 13.0 (H) 08/16/2020    HCT 41.0 08/16/2020    PLTA 294 08/16/2020    PT 11.0 04/18/2017    APTT 30.5 04/18/2017    INR 1.0 04/18/2017         Anesthesia Plan    ASA Score:     ASA:  3    Airway:      Mallampati:  II    Mouth opening:  >3 FB    TM distance:  >3 FB    Plan: MAC    Other information:     EKG Reviewed: : No      Full Stomach Precaution:: No      Post-Plan::  PACU    Informed Consent:     Anesthetic plan and risks discussed with:  Patient   Patient Consented        Attending Anesthesiologist Statement:     Reassessed day of surgery: Yes        Assessment made, necessary equipment and appropriate plan in place.

## 2022-01-24 NOTE — Anesthesia Postprocedure Evaluation (Signed)
Anesthesia Post-Operative Evaluation Note    Patient: Samantha Cameron           Procedure Summary     Date: 01/24/22 Room / Location: [facility] / [facility]    Anesthesia Start: 1034 Anesthesia Stop: 1106    Procedure: CATARACT SURGERY WITH PC IOL (PHACO) (Right: Eye) Diagnosis:       Combined forms of age-related cataract of right eye      (Combined forms of age-related cataract of right eye [H25.811])    Surgeons: Charise Carwin, MD Responsible Provider: Gwenette Greet, MD    Anesthesia Type: MAC ASA Status: 3            POST-OPERATIVE EVALUATION    Anesthesia Post Evaluation    Vitals signs in patient's normal range: Yes  Respiratory function stable; airway patent: Yes  Cardiovascular function stable: Yes  Hydration status stable: Yes  Mental status recovered; patient participates in evaluation and/or is at baseline: Yes  Pain control satisfactory: Yes  Nausea and vomiting control satisfactory: Yes    Procedure was labor & delivery no  PostOP disposition PACU  Anesthesia Observation no significant observation      Last vitals    BP: 105/65 (01/24/2022 11:07 AM)  Temp: 97.9 F (36.6 C) (01/24/2022 11:06 AM)  Pulse: 82 (01/24/2022 11:07 AM)  Resp: 16 (01/24/2022 11:07 AM)  SpO2: 96 % (01/24/2022 11:07 AM)

## 2022-01-24 NOTE — H&P (Signed)
Today I have discussed symptoms and complaints with the patient, reviewed the patient's vital signs, and repeated a focused examination of the patient. There are not any changes since the complete H&P which was already done by the patient's PCP, Osa Craver., MD , on 01/22/22 ,which I have reviewed today.      01/24/22  0834 01/24/22  0847   BP:  99/67   Pulse:  87   Resp:  19   Temp:  98.2 F (36.8 C)   TempSrc:  Temporal   SpO2:  99%   Weight: 95.3 kg (210 lb)    Height: '5\' 2"'$  (1.575 m)          The patient was in no apparent distress    HEENT-grossly wnl  Cor-S1S2  Lungs-Clear  Abdomen-soft, non tender  Extremities-no gross edema/swelling    ROS-no new complaints or symptoms  Medications reviewed  Allergies reviewed    The patient is medically stable to proceed with the planned ocular surgery today.

## 2022-01-25 ENCOUNTER — Ambulatory Visit: Payer: Medicare Other | Attending: Ophthalmology | Admitting: Ophthalmology

## 2022-01-25 ENCOUNTER — Encounter (HOSPITAL_BASED_OUTPATIENT_CLINIC_OR_DEPARTMENT_OTHER): Payer: Self-pay | Admitting: Ophthalmology

## 2022-01-25 DIAGNOSIS — E1129 Type 2 diabetes mellitus with other diabetic kidney complication: Secondary | ICD-10-CM | POA: Diagnosis present

## 2022-01-25 DIAGNOSIS — H25811 Combined forms of age-related cataract, right eye: Secondary | ICD-10-CM | POA: Insufficient documentation

## 2022-01-25 DIAGNOSIS — Z961 Presence of intraocular lens: Secondary | ICD-10-CM | POA: Insufficient documentation

## 2022-01-25 DIAGNOSIS — R809 Proteinuria, unspecified: Secondary | ICD-10-CM | POA: Insufficient documentation

## 2022-01-25 MED ORDER — PREDNISOLONE ACETATE 1 % OP SUSP
1.00 [drp] | Freq: Four times a day (QID) | OPHTHALMIC | 1 refills | Status: AC
Start: 2022-01-25 — End: 2022-02-25

## 2022-01-25 NOTE — Progress Notes (Signed)
1 day s/p Phaco CE+PC IOL OD, Pseudophakia OS (2021), AODM-no gross retinopathy.    OD was painful with FB sensation yesterday, it feels a little better today.

## 2022-01-25 NOTE — Progress Notes (Signed)
Samantha Cameron was seen day #1 s/p uncomplicated cataract surgery right eye.   c.o. Mild pain yesterday, "better" now    Impression,    1. Pseudophakia OD 01/24/22     She was doing well.    She was told to  use Pred Forte 1% and Vigamox 1 gtt in the post operative eye QID.      She was instructed to cover the operated eye with the metal shield while sleeping for the first week and to return to clinic in  one week for follow up.     She was also instructed to return to clinic immediately if there is decreased vision, ocular pain, or any other problems that develop.        2. Pseudophakia OSn 2021    3. AODM

## 2022-01-28 ENCOUNTER — Other Ambulatory Visit (HOSPITAL_BASED_OUTPATIENT_CLINIC_OR_DEPARTMENT_OTHER): Payer: Self-pay | Admitting: Internal Medicine

## 2022-01-29 NOTE — Telephone Encounter (Signed)
PER Pharmacy, Samantha Cameron is a 66 year old female has requested a refill of  - incruse ellipta    Last Office Visit: 10/31/20 with Rabkina A    Last Physical Exam: 0/26/16  COLONOSCOPY due on 01/20/2022  Other Med Adult:  Most Recent BP Reading(s)  01/24/22 : 105/65        Cholesterol (mg/dL)   Date Value   10/03/2021 162     LOW DENSITY LIPOPROTEIN DIRECT (mg/dL)   Date Value   10/03/2021 72     HIGH DENSITY LIPOPROTEIN (mg/dL)   Date Value   10/03/2021 53     TRIGLYCERIDES (mg/dL)   Date Value   10/03/2021 217 (H)         THYROID SCREEN TSH REFLEX FT4 (uIU/mL)   Date Value   05/26/2020 2.040         No results found for: TSH    HEMOGLOBIN A1C (%)   Date Value   10/03/2021 6.5 (H)       No results found for: POCA1C      INR (no units)   Date Value   04/18/2017 1.0   05/27/2016 1.0   02/01/2012 < 1.0 (L)       SODIUM (mmol/L)   Date Value   02/02/2021 136       POTASSIUM (mmol/L)   Date Value   02/02/2021 4.0           CREATININE (mg/dL)   Date Value   02/02/2021 0.8     Documented patient preferred pharmacies:    [facility] - [address], [place] - [place]  Phone: [phone] Fax: [phone]

## 2022-02-01 ENCOUNTER — Other Ambulatory Visit: Payer: Self-pay

## 2022-02-01 ENCOUNTER — Ambulatory Visit: Payer: Medicare Other | Attending: Ophthalmology | Admitting: Optometrist

## 2022-02-01 DIAGNOSIS — Z9841 Cataract extraction status, right eye: Secondary | ICD-10-CM | POA: Diagnosis present

## 2022-02-01 DIAGNOSIS — M4727 Other spondylosis with radiculopathy, lumbosacral region: Secondary | ICD-10-CM | POA: Diagnosis not present

## 2022-02-01 NOTE — Progress Notes (Signed)
Assessment:   1) S/p cataract extraction with PC IOL OD x  1 week. (01/24/2022 SMP) Patient doing well, no pain, improved vision to pre-op.   2) Pseudophakia OS (03/09/2020 SMP), doing well.   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 Patient should call immediately for any eye pain, increased redness or decreased vision. Otherwise should be seen back in 3-4 weeks for refraction and dilated fundus exam.   2-3) Monitor at follow-up.

## 2022-02-01 NOTE — Patient Instructions (Signed)
Stop using Vigamox at this time.     Continue Prednisolone Acetate (Pred Forte) as follows:   1 drop in the on right eye 4 times a day for 3 more weeks  Stop drop on 02/22/2022     You should call [phone]) immediately for any eye pain, increased redness or decreased vision.     Return to clinic in approximately for 1 month follow-up at which time we will dilate your eyes and give you your new glasses prescription.

## 2022-02-01 NOTE — H&P (Signed)
Pt here for 1 week f/u s/p cataract extraction with PC IOL OD (01/24/2022 SMP)    Using all post-operative drops appropriately.     Vision reported as improved OD to pre-op, stable OS.     Denies other new problems.     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

## 2022-02-07 ENCOUNTER — Telehealth (HOSPITAL_BASED_OUTPATIENT_CLINIC_OR_DEPARTMENT_OTHER): Payer: Self-pay | Admitting: Internal Medicine

## 2022-02-07 DIAGNOSIS — M47817 Spondylosis without myelopathy or radiculopathy, lumbosacral region: Secondary | ICD-10-CM | POA: Diagnosis not present

## 2022-02-07 NOTE — Telephone Encounter (Signed)
PT-1 request is submitted  PT-1 Request [phone] isAuthorized  An email notification has been sent to the provider on behalf of whom the PT-1 request was submitted. To update the email address, please visit your profile page.        Following are the details of your trip.     Trip Summary       Member Home:  [phone]  [place]  [id number] [id number]   Treatment Details    Treating facility within [facility] locality Yes   Medical treatment type: Z00-Z99 - Factors influencing health status and contact with health services   Duration: 12 Month(s)   Frequency: 12 visit(s) per Month   Transportation Details    Member will require a wheelchair van No   Member will be accompanied by an escort No   Member will require a service animal No   For any assistance, please contact [facility] at [phone]).      PT-1 Request Confirmation  PT-1 request is submitted  PT-1 Request [phone] isPending. For pending submissions, please check the portal periodically for updates.  An email notification has been sent to the provider on behalf of whom the PT-1 request was submitted. To update the email address, please visit your profile page.        Below are the details of your pending request.     Trip Summary       Member Home:  [phone] [id number]  Treating Location:  [id number] [id number]   Treatment Details    Treating facility within member's locality No   Medical treatment type: Z00-Z99 - Factors influencing health status and contact with health services   Duration: 12 Month(s)   Frequency: 12 visit(s) per Month   Transportation Details    Member will require a wheelchair van No   Member will be accompanied by an escort No   Member will require a service animal No    For any assistance, please contact [place] at [phone]).    [facility] Pt-1  Phone Number:  [phone] (Call me)     Samantha Cameron [id number], 66 year old, female     Calls today: Forms PT1 form   Name of treating facility? [facility], [address], [place], [place] [address]   What are they seeing the specialist for back injury   3 visit(s) per week 12 visit(s) per month   Is wheelchair Lucianne Lei needed? No   Is an escort accompanying the member for assistance with ambulation? No}     Name of treating facility? [facility], [address], [place] are they seeing the specialist for Cardiology   3 visit(s) per week 12 visit(s) per month   Is wheelchair Lucianne Lei needed? No   Is an escort accompanying the member for assistance with ambulation? No}     Person calling on behalf of patient: Patient (self)     CALL BACK NUMBER: [phone]     Best time to call back:   Cell phone:   Other phone:     Patient's language of care: English     Patient does not need an interpreter.     Patient's PCP: Driscilla Grammes, MD

## 2022-02-12 ENCOUNTER — Encounter (HOSPITAL_BASED_OUTPATIENT_CLINIC_OR_DEPARTMENT_OTHER): Payer: Self-pay | Admitting: Cardiovascular Disease

## 2022-02-22 ENCOUNTER — Encounter (HOSPITAL_BASED_OUTPATIENT_CLINIC_OR_DEPARTMENT_OTHER): Payer: Self-pay | Admitting: Internal Medicine

## 2022-02-22 NOTE — Progress Notes (Signed)
Medicare DWO for lancets printed and signed  Will be scanned and sent

## 2022-03-01 DIAGNOSIS — M4726 Other spondylosis with radiculopathy, lumbar region: Secondary | ICD-10-CM | POA: Diagnosis not present

## 2022-03-01 DIAGNOSIS — M461 Sacroiliitis, not elsewhere classified: Secondary | ICD-10-CM | POA: Diagnosis not present

## 2022-03-01 DIAGNOSIS — Z79891 Long term (current) use of opiate analgesic: Secondary | ICD-10-CM | POA: Diagnosis not present

## 2022-03-08 ENCOUNTER — Ambulatory Visit: Payer: Medicare Other | Attending: Ophthalmology | Admitting: Optometrist

## 2022-03-08 ENCOUNTER — Other Ambulatory Visit: Payer: Self-pay

## 2022-03-08 DIAGNOSIS — H524 Presbyopia: Secondary | ICD-10-CM | POA: Diagnosis present

## 2022-03-08 DIAGNOSIS — Z961 Presence of intraocular lens: Secondary | ICD-10-CM | POA: Diagnosis present

## 2022-03-08 DIAGNOSIS — H5213 Myopia, bilateral: Secondary | ICD-10-CM | POA: Diagnosis present

## 2022-03-08 DIAGNOSIS — H04123 Dry eye syndrome of bilateral lacrimal glands: Secondary | ICD-10-CM | POA: Insufficient documentation

## 2022-03-08 DIAGNOSIS — H52203 Unspecified astigmatism, bilateral: Secondary | ICD-10-CM | POA: Diagnosis present

## 2022-03-08 DIAGNOSIS — Z9841 Cataract extraction status, right eye: Secondary | ICD-10-CM | POA: Diagnosis present

## 2022-03-08 MED ORDER — CARBOXYMETHYLCELLULOSE SODIUM 0.5 % OP SOLN
1.0000 [drp] | Freq: Four times a day (QID) | OPHTHALMIC | 10 refills | Status: DC
Start: 2022-03-08 — End: 2023-03-27

## 2022-03-08 NOTE — Progress Notes (Signed)
Assessment:   1) S/p cataract extraction with PC IOL OD x  1 week. (01/24/2022 SMP) Patient doing well, no pain, improved vision to pre-op.   2) Pseudophakia OS (03/09/2020 SMP), doing well.   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 Patient should call immediately for any eye pain, increased redness or decreased vision. Otherwise should be seen back in about 3 months with Dr. Lonia Chimera.   2-3) Monitor at follow-up.   4) Educated on findings, stressed the importance of proper lid hygiene to help with symptoms. This included warm compresses over each eye for a minimum of 5-10 minutes a day, lid scrubs with dilute baby shampoo, and instillation of artificial tears 3-4 times each day in both eyes. Should symptoms gets get worse or show no improvement with the mentioned treatment, patient should call clinic.   5) Pt given prescription for spectacle lenses, options for visual correction were discussed. Monitor annually or sooner should problems arise.

## 2022-03-08 NOTE — Patient Instructions (Signed)
To help read the fine print up close there are many options for visual correction. These option include:    - Switching between a pair or glasses with your distance prescription and a pair of glasses with your near prescription    - Bifocals with the line, which would have the distance prescription on the top of the lens (above the live) and the near or reading prescription below the line    - Progressive lenses, which are a type of bifocal without the line. The power on the progressive starts with distance in the top of the lens making a progressive transition down the lenses increasing the power for reading    Your prescription is written so that you can do any of the above options.     **For glasses you can bring your prescription to the [facility] in [place] (information on the bottom of the prescription)     Otherwise, you can bring the paper to any other optical shop along with your insurance card. Do make sure the optical shop accepts your insurance if you have coverage for glasses.   Types of other optical shops: [facility] ([place]), [facility], [facility], [facility], some [facility]  ________________________________________________________________________    Dry Eye Treatment:  - Warm compresses (warm facecloth or heated eye masks such as Bruder/Tranquileyes/Thera-Pearl) held over the eyes twice a day for 5-10 minutes, this will help to relieve the blockage of the glands and recover the function of the oil glands in the eyelids to produce better tears. Can also gently massage the eyelids as you are doing the warm compresses    - Using artificial tears 3-4 times a day in both eyes to help with burning, itching, and irritation. These can be purchased over the counter in brand names such as: Refresh, Systane, TheraTears, Blink, Soothe. You should stay away from Visine, Roto, Clear Eyes or any drops that is used to "get the red out," as these  will make symptoms worse.     - Lid scrubs with dilute baby shampoo will keep lids clear of any irritants without stinging the eyes while you are washing your lashes     - Making sure to follow the 20/20/20 rule with any prolonged near work (reading, computer, etc). This means every 20 minutes, look 20 feet away, for at least 20 seconds. This will give your eyes a rest and a chance to blink well to rehydrate the front of your eyes.

## 2022-03-08 NOTE — H&P (Signed)
Pt here for 1 month f/u s/p cataract extraction with PC IOL OD (01/24/2022 SMP)    D/C'd all post-operative drops appropriately.     Vision reported as improved OD to pre-op, stable OS.     Denies other new problems.     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

## 2022-03-11 ENCOUNTER — Encounter (HOSPITAL_BASED_OUTPATIENT_CLINIC_OR_DEPARTMENT_OTHER): Payer: Self-pay

## 2022-03-11 ENCOUNTER — Emergency Department (HOSPITAL_BASED_OUTPATIENT_CLINIC_OR_DEPARTMENT_OTHER): Payer: Non-veteran care

## 2022-03-11 ENCOUNTER — Emergency Department
Admission: EM | Admit: 2022-03-11 | Discharge: 2022-03-11 | Disposition: A | Discharge status: Other Acute Inpatient Hospital | Payer: Non-veteran care | Attending: Emergency Medicine | Admitting: Emergency Medicine

## 2022-03-11 DIAGNOSIS — M48061 Spinal stenosis, lumbar region without neurogenic claudication: Secondary | ICD-10-CM | POA: Diagnosis not present

## 2022-03-11 DIAGNOSIS — M542 Cervicalgia: Secondary | ICD-10-CM | POA: Insufficient documentation

## 2022-03-11 DIAGNOSIS — Z20822 Contact with and (suspected) exposure to covid-19: Secondary | ICD-10-CM

## 2022-03-11 DIAGNOSIS — M545 Low back pain, unspecified: Secondary | ICD-10-CM

## 2022-03-11 DIAGNOSIS — M47813 Spondylosis without myelopathy or radiculopathy, cervicothoracic region: Secondary | ICD-10-CM | POA: Diagnosis not present

## 2022-03-11 DIAGNOSIS — R202 Paresthesia of skin: Secondary | ICD-10-CM | POA: Diagnosis not present

## 2022-03-11 DIAGNOSIS — S199XXA Unspecified injury of neck, initial encounter: Secondary | ICD-10-CM | POA: Diagnosis not present

## 2022-03-11 DIAGNOSIS — S0990XA Unspecified injury of head, initial encounter: Secondary | ICD-10-CM | POA: Diagnosis not present

## 2022-03-11 DIAGNOSIS — Y92410 Unspecified street and highway as the place of occurrence of the external cause: Secondary | ICD-10-CM | POA: Insufficient documentation

## 2022-03-11 DIAGNOSIS — R200 Anesthesia of skin: Secondary | ICD-10-CM | POA: Diagnosis not present

## 2022-03-11 DIAGNOSIS — S3992XA Unspecified injury of lower back, initial encounter: Secondary | ICD-10-CM | POA: Diagnosis not present

## 2022-03-11 DIAGNOSIS — M47816 Spondylosis without myelopathy or radiculopathy, lumbar region: Secondary | ICD-10-CM | POA: Diagnosis not present

## 2022-03-11 DIAGNOSIS — M5489 Other dorsalgia: Secondary | ICD-10-CM | POA: Diagnosis not present

## 2022-03-11 MED ORDER — OXYCODONE HCL 5 MG PO TABS
5.00 mg | ORAL_TABLET | Freq: Once | ORAL | Status: AC
Start: 2022-03-11 — End: 2022-03-11
  Administered 2022-03-11: 5 mg via ORAL
  Filled 2022-03-11: qty 1

## 2022-03-11 MED ORDER — ACETAMINOPHEN 500 MG PO TABS
1000.00 mg | ORAL_TABLET | Freq: Once | ORAL | Status: AC
Start: 2022-03-11 — End: 2022-03-11
  Administered 2022-03-11: 1000 mg via ORAL
  Filled 2022-03-11: qty 2
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
## 2022-03-11 NOTE — Narrator Note (Signed)
Assisted patient onto bedside commode. C/o increased pain with movement. Denies any laterality of pain with legs
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
## 2022-03-12 DIAGNOSIS — M542 Cervicalgia: Secondary | ICD-10-CM | POA: Diagnosis not present

## 2022-03-13 LAB — COVID-19 OUTPATIENT: COVID-19 OUTPATIENT: NEGATIVE

## 2022-03-19 DIAGNOSIS — M545 Low back pain, unspecified: Secondary | ICD-10-CM | POA: Insufficient documentation

## 2022-03-19 DIAGNOSIS — R208 Other disturbances of skin sensation: Secondary | ICD-10-CM | POA: Insufficient documentation

## 2022-03-19 DIAGNOSIS — M549 Dorsalgia, unspecified: Secondary | ICD-10-CM | POA: Insufficient documentation

## 2022-03-19 DIAGNOSIS — M542 Cervicalgia: Secondary | ICD-10-CM | POA: Insufficient documentation

## 2022-03-19 DIAGNOSIS — G8929 Other chronic pain: Secondary | ICD-10-CM | POA: Insufficient documentation

## 2022-03-19 DIAGNOSIS — R29898 Other symptoms and signs involving the musculoskeletal system: Secondary | ICD-10-CM | POA: Insufficient documentation

## 2022-03-19 DIAGNOSIS — M48061 Spinal stenosis, lumbar region without neurogenic claudication: Secondary | ICD-10-CM | POA: Insufficient documentation

## 2022-03-21 ENCOUNTER — Other Ambulatory Visit (HOSPITAL_BASED_OUTPATIENT_CLINIC_OR_DEPARTMENT_OTHER): Payer: Self-pay | Admitting: Internal Medicine

## 2022-03-21 ENCOUNTER — Ambulatory Visit: Payer: Non-veteran care | Attending: Internal Medicine | Admitting: Internal Medicine

## 2022-03-21 ENCOUNTER — Other Ambulatory Visit: Payer: Self-pay

## 2022-03-21 VITALS — BP 104/67 | HR 95 | Temp 97.7°F | Wt 209.2 lb

## 2022-03-21 DIAGNOSIS — Z6838 Body mass index (BMI) 38.0-38.9, adult: Secondary | ICD-10-CM | POA: Diagnosis present

## 2022-03-21 DIAGNOSIS — M2041 Other hammer toe(s) (acquired), right foot: Secondary | ICD-10-CM

## 2022-03-21 DIAGNOSIS — M2042 Other hammer toe(s) (acquired), left foot: Secondary | ICD-10-CM | POA: Diagnosis present

## 2022-03-21 DIAGNOSIS — G8929 Other chronic pain: Secondary | ICD-10-CM | POA: Diagnosis present

## 2022-03-21 DIAGNOSIS — R208 Other disturbances of skin sensation: Secondary | ICD-10-CM | POA: Diagnosis present

## 2022-03-21 DIAGNOSIS — M48061 Spinal stenosis, lumbar region without neurogenic claudication: Secondary | ICD-10-CM | POA: Diagnosis present

## 2022-03-21 DIAGNOSIS — M21619 Bunion of unspecified foot: Secondary | ICD-10-CM

## 2022-03-21 DIAGNOSIS — R29898 Other symptoms and signs involving the musculoskeletal system: Secondary | ICD-10-CM

## 2022-03-21 DIAGNOSIS — M542 Cervicalgia: Secondary | ICD-10-CM | POA: Insufficient documentation

## 2022-03-21 DIAGNOSIS — M545 Low back pain, unspecified: Secondary | ICD-10-CM | POA: Diagnosis present

## 2022-03-21 DIAGNOSIS — M549 Dorsalgia, unspecified: Secondary | ICD-10-CM | POA: Insufficient documentation

## 2022-03-21 MED ORDER — CYCLOBENZAPRINE HCL 5 MG PO TABS
5.00 mg | ORAL_TABLET | Freq: Three times a day (TID) | ORAL | 0 refills | Status: AC | PRN
Start: 2022-03-21 — End: 2022-04-20

## 2022-03-21 MED ORDER — DICLOFENAC SODIUM 1 % EX GEL
2.0000 g | Freq: Two times a day (BID) | CUTANEOUS | 1 refills | Status: DC
Start: 2022-03-21 — End: 2022-07-30

## 2022-03-21 NOTE — Patient Instructions (Addendum)
Call [facility] for spine center appt    Call for physical appt at [facility]
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
## 2022-03-28 ENCOUNTER — Encounter (HOSPITAL_BASED_OUTPATIENT_CLINIC_OR_DEPARTMENT_OTHER): Payer: Self-pay

## 2022-03-28 ENCOUNTER — Other Ambulatory Visit (HOSPITAL_BASED_OUTPATIENT_CLINIC_OR_DEPARTMENT_OTHER): Payer: Self-pay | Admitting: Internal Medicine

## 2022-03-28 MED ORDER — GLUCOSE BLOOD VI STRP
ORAL_STRIP | Freq: Every day | 11 refills | Status: DC
Start: 2022-03-28 — End: 2022-10-25

## 2022-03-28 NOTE — Telephone Encounter (Signed)
PER Patient (self), Samantha Cameron is a 66 year old female has requested a refill of      -  test strips       Last Office Visit: 03/21/2022 with Lenore Cordia  Last Physical Exam: 08/15/2015     A1C due on 01/03/2022     Other Med Adult:  Most Recent BP Reading(s)  03/21/22 : 104/67        Cholesterol (mg/dL)   Date Value   10/03/2021 162     LOW DENSITY LIPOPROTEIN DIRECT (mg/dL)   Date Value   10/03/2021 72     HIGH DENSITY LIPOPROTEIN (mg/dL)   Date Value   10/03/2021 53     TRIGLYCERIDES (mg/dL)   Date Value   10/03/2021 217 (H)         THYROID SCREEN TSH REFLEX FT4 (uIU/mL)   Date Value   05/26/2020 2.040         No results found for: TSH    HEMOGLOBIN A1C (%)   Date Value   10/03/2021 6.5 (H)       No results found for: POCA1C      INR (no units)   Date Value   04/18/2017 1.0   05/27/2016 1.0   02/01/2012 < 1.0 (L)       SODIUM (mmol/L)   Date Value   02/02/2021 136       POTASSIUM (mmol/L)   Date Value   02/02/2021 4.0           CREATININE (mg/dL)   Date Value   02/02/2021 0.8        Documented patient preferred pharmacies:    [facility] [address] [place] - [place]  Phone: [phone] Fax: [phone]

## 2022-03-28 NOTE — Telephone Encounter (Signed)
-----   Message from Orin sent at 03/28/2022 11:16 AM EDT -----  Regarding: refill request  [facility] calling on behalf of patient: Patient (self)    May list multiple medications in this section    Medicine Name: FREESTYLE LITE TEST STRIP       Documented patient preferred pharmacies:   [facility] [place], [place]  Phone: [phone] Fax: [phone]

## 2022-03-28 NOTE — Progress Notes (Signed)
Samantha Cameron, [id number], female    Date of Birth: November 17, 1956, 65 year old  Telephone Information:   Home Phone [phone]   Work Phone [phone]   Mobile [phone]       Patient's PCP: Driscilla Grammes., MD     Patient's language of care:  English    Caller does not need an interpreter.    Pt/Parent walks in today to drop off form/letter for: Medicare DWO     Number of forms dropped off: 1  pages    Did patient fill in information required of them on form, and sign where applicable?  Yes    Signed consent form on file or attached:  Yes      If not, have patient fill out [employer] release form.                            Date of Last visit: 03/21/2022    Does patient need to be seen before provider can complete form?  No    If yes, make appointment with patient with in 2-10 business days, For provider to complete form with patient.  Disability forms may require that patient has been seen within 30 days of completing form.  Appt scheduled for:       Date patient needs completed by: soon as possible   Patient's contact telephone number: [phone]      Will pick-up at the front desk when notified form is ready:  Yes    Fax to:  [phone]) (requires signed release if other than Will pick-up original at the front desk after form has been faxed:  Yes    Pt/person dropping off form understands that forms are subject to the 7 - 10 day letter/form policy & letter/form will be complete as soon as possible by provider ; (verbal notification given to person dropping off form).      Form/Letter placed in MD box for completion.  (Located in Team office)  Mitchel Honour, 03/28/2022

## 2022-03-29 DIAGNOSIS — M47816 Spondylosis without myelopathy or radiculopathy, lumbar region: Secondary | ICD-10-CM | POA: Diagnosis not present

## 2022-03-29 DIAGNOSIS — Z79891 Long term (current) use of opiate analgesic: Secondary | ICD-10-CM | POA: Diagnosis not present

## 2022-03-29 DIAGNOSIS — M5136 Other intervertebral disc degeneration, lumbar region: Secondary | ICD-10-CM | POA: Diagnosis not present

## 2022-04-02 ENCOUNTER — Ambulatory Visit (HOSPITAL_BASED_OUTPATIENT_CLINIC_OR_DEPARTMENT_OTHER): Payer: Medicare Other | Admitting: Podiatrist

## 2022-04-02 ENCOUNTER — Telehealth (HOSPITAL_BASED_OUTPATIENT_CLINIC_OR_DEPARTMENT_OTHER): Payer: Self-pay

## 2022-04-02 DIAGNOSIS — M2042 Other hammer toe(s) (acquired), left foot: Secondary | ICD-10-CM | POA: Insufficient documentation

## 2022-04-02 DIAGNOSIS — R208 Other disturbances of skin sensation: Secondary | ICD-10-CM | POA: Insufficient documentation

## 2022-04-02 DIAGNOSIS — M2041 Other hammer toe(s) (acquired), right foot: Secondary | ICD-10-CM | POA: Insufficient documentation

## 2022-04-02 DIAGNOSIS — M21619 Bunion of unspecified foot: Secondary | ICD-10-CM | POA: Insufficient documentation

## 2022-04-02 DIAGNOSIS — M542 Cervicalgia: Secondary | ICD-10-CM | POA: Insufficient documentation

## 2022-04-02 DIAGNOSIS — M549 Dorsalgia, unspecified: Secondary | ICD-10-CM | POA: Insufficient documentation

## 2022-04-02 NOTE — Telephone Encounter (Signed)
LVM for patient to call back if they would like to reschedule missed appointment with Dr. Theodoulou.

## 2022-04-04 ENCOUNTER — Encounter (HOSPITAL_BASED_OUTPATIENT_CLINIC_OR_DEPARTMENT_OTHER): Payer: Self-pay | Admitting: Internal Medicine

## 2022-04-04 ENCOUNTER — Other Ambulatory Visit (HOSPITAL_BASED_OUTPATIENT_CLINIC_OR_DEPARTMENT_OTHER): Payer: Self-pay | Admitting: Internal Medicine

## 2022-04-04 NOTE — Progress Notes (Signed)
Completed DM supply DWO  Called patient   Asks for it to be faxed  Will leave to be faxed and scanned

## 2022-04-05 ENCOUNTER — Encounter (HOSPITAL_BASED_OUTPATIENT_CLINIC_OR_DEPARTMENT_OTHER): Payer: Self-pay

## 2022-04-05 ENCOUNTER — Telehealth (HOSPITAL_BASED_OUTPATIENT_CLINIC_OR_DEPARTMENT_OTHER): Payer: Self-pay

## 2022-04-05 ENCOUNTER — Ambulatory Visit (HOSPITAL_BASED_OUTPATIENT_CLINIC_OR_DEPARTMENT_OTHER): Payer: Non-veteran care | Admitting: Rehabilitative and Restorative Service Providers"

## 2022-04-05 ENCOUNTER — Other Ambulatory Visit: Payer: Self-pay

## 2022-04-05 DIAGNOSIS — G8929 Other chronic pain: Secondary | ICD-10-CM | POA: Diagnosis present

## 2022-04-05 DIAGNOSIS — M542 Cervicalgia: Secondary | ICD-10-CM

## 2022-04-05 DIAGNOSIS — R208 Other disturbances of skin sensation: Secondary | ICD-10-CM | POA: Diagnosis present

## 2022-04-05 DIAGNOSIS — M549 Dorsalgia, unspecified: Secondary | ICD-10-CM | POA: Diagnosis present

## 2022-04-05 DIAGNOSIS — M48061 Spinal stenosis, lumbar region without neurogenic claudication: Secondary | ICD-10-CM | POA: Diagnosis present

## 2022-04-05 DIAGNOSIS — R29898 Other symptoms and signs involving the musculoskeletal system: Secondary | ICD-10-CM | POA: Diagnosis present

## 2022-04-05 DIAGNOSIS — M545 Low back pain, unspecified: Secondary | ICD-10-CM | POA: Diagnosis present

## 2022-04-05 NOTE — Telephone Encounter (Signed)
Received message regarding test strips, per epic Rx was sent 5/10.    [facility], They just received DWO, test strips are all set.    Called and spoke with patient advised test strips should be able to be picked up now.
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
## 2022-04-05 NOTE — PostOp Call (Signed)
CALANDRIA MULLINGS [phone], 66 year old, female    Calls today:  Clinical Questions (NON-SICK CLINICAL QUESTIONS ONLY)    Name of person calling   Specific nature of request Patient is calling stating [facility] for test strips and they haven't patient will like a call back. Thank you   Return phone number [phone]    Person calling on behalf of patient: Patient (self)      Patient's language of care: English    Patient does not need an interpreter.    Patient's PCP: Driscilla Grammes., MD    Primary Care Home Site:  [facility]

## 2022-04-09 ENCOUNTER — Ambulatory Visit (HOSPITAL_BASED_OUTPATIENT_CLINIC_OR_DEPARTMENT_OTHER): Payer: Non-veteran care | Admitting: Rehabilitative and Restorative Service Providers"

## 2022-04-09 ENCOUNTER — Other Ambulatory Visit: Payer: Self-pay

## 2022-04-09 DIAGNOSIS — R208 Other disturbances of skin sensation: Secondary | ICD-10-CM | POA: Diagnosis present

## 2022-04-09 DIAGNOSIS — M542 Cervicalgia: Secondary | ICD-10-CM | POA: Diagnosis present

## 2022-04-09 DIAGNOSIS — M545 Low back pain, unspecified: Secondary | ICD-10-CM | POA: Diagnosis not present

## 2022-04-09 DIAGNOSIS — G8929 Other chronic pain: Secondary | ICD-10-CM | POA: Diagnosis present

## 2022-04-09 DIAGNOSIS — M549 Dorsalgia, unspecified: Secondary | ICD-10-CM | POA: Diagnosis present

## 2022-04-09 DIAGNOSIS — R29898 Other symptoms and signs involving the musculoskeletal system: Secondary | ICD-10-CM | POA: Diagnosis present

## 2022-04-09 DIAGNOSIS — M48061 Spinal stenosis, lumbar region without neurogenic claudication: Secondary | ICD-10-CM | POA: Diagnosis present

## 2022-04-09 NOTE — Progress Notes (Signed)
I certify that the documented Treatment Plan is reasonable and necessary.    04/09/2022  Driscilla Grammes, MD

## 2022-04-09 NOTE — Progress Notes (Signed)
04/05/22 1100   Language Information   Language of Care English   Precautions   Precautions Yes   Other   Other (Comments) L Knee replacement about 14 yrs ago,  Anxiety,  Diastolic dysfunction with chronic heart failure ([place]), DM II, Chronic LBP   Rehab Discipline   Rehab Discipline PT   Visit   Visit number Initial Eval 1   POC Due date By 05/06/22   Manual Therapy   Technique Plan STM to B upper traps, neck area   Hot pack   Joints Cervical Spine  (Right upper traps)   Position Seated   Time in minutes 6 min   Patient Education   What was taught? POC, Prognosis, HEP   Method Verbal;Demo;Practice;Written   Patient comprehension Yes       Richarda Osmond, PT, Lic # [id number]

## 2022-04-09 NOTE — Progress Notes (Signed)
 S: Pt reports his pain was 8/10 before rx and 7/10 post rx.     O: Instructed in indications and contra-indications of Kinesio taping.  Instructed to remove taping immediately if itching occurs.  Otherwise removal of taping is advised post application of three days.  Instructed to remove taping post a shower.  It the removal of the taping is difficult loosen with lotion.        04/09/22 1600   Language Information   Language of Care English   Precautions   Precautions Yes   Other   Other (Comments) L Knee replacement about 14 yrs ago,  Anxiety,  Diastolic dysfunction with chronic heart failure ([place]), DM II, Chronic LBP   Rehab Discipline   Rehab Discipline PT   Visit   Visit number 2   POC Due date By 05/06/22   Pain   Pain Score 8    Manual Therapy   Technique STM to B upper traps, neck area   Time in minutes 6   Manual Therapy 2   Technique IASTM with GT3   Time in minutes 2   Ther Exercise   Therapeutic Exercise? Yes   Exercise Cerv SB seated   Reps 10   Sets 1   Holds 1 sec   Ther Exercise 2   Exercise Cerv ROT seated   Reps 2 10   Sets 2 1   Holds 2 1 sec   Ther Exercise 3   Exercise 3 Scap squeezes   Reps 3 10   Sets 3 1   Holds 3 2 sec   Other   Other Treatments Miscellaneous   Miscellaneous KT - I strip vertical over spine T7 to C7 and I strip over L UT   Patient Education   What was taught? HEP; Indications and contra-indications of Kinesio   Method Verbal;Demo;Practice  (Application of KT)   Patient comprehension Yes     A: Noted increased tissue density L UT and L scap paraspinals.  Noted muscle guarding elicited to touch.  Had fair tolerance of IASTM.      P: Plan to continue with strengthening cervical and scap strengthening.      **Please note that in the flow sheet below, only exercises and manual therapies that have a time frame and repetition were performed during today's visit.  Those without a time frame or repetition were not performed today.  They are listed there for the plan of future visits.   Thank you.     Mariel Craft, PTA Lic # [id number]    Dina Mobley,PTA, PTA, Lic # [id number]

## 2022-04-18 ENCOUNTER — Other Ambulatory Visit: Payer: Self-pay

## 2022-04-18 ENCOUNTER — Ambulatory Visit (HOSPITAL_BASED_OUTPATIENT_CLINIC_OR_DEPARTMENT_OTHER): Payer: Non-veteran care | Admitting: Rehabilitative and Restorative Service Providers"

## 2022-04-18 DIAGNOSIS — M48061 Spinal stenosis, lumbar region without neurogenic claudication: Secondary | ICD-10-CM | POA: Diagnosis present

## 2022-04-18 DIAGNOSIS — R208 Other disturbances of skin sensation: Secondary | ICD-10-CM | POA: Diagnosis present

## 2022-04-18 DIAGNOSIS — M542 Cervicalgia: Secondary | ICD-10-CM

## 2022-04-18 DIAGNOSIS — R29898 Other symptoms and signs involving the musculoskeletal system: Secondary | ICD-10-CM | POA: Diagnosis present

## 2022-04-18 DIAGNOSIS — G8929 Other chronic pain: Secondary | ICD-10-CM | POA: Diagnosis present

## 2022-04-18 DIAGNOSIS — M545 Low back pain, unspecified: Secondary | ICD-10-CM | POA: Diagnosis not present

## 2022-04-18 DIAGNOSIS — M549 Dorsalgia, unspecified: Secondary | ICD-10-CM | POA: Diagnosis present

## 2022-04-19 ENCOUNTER — Encounter (HOSPITAL_BASED_OUTPATIENT_CLINIC_OR_DEPARTMENT_OTHER): Payer: Self-pay | Admitting: Student in an Organized Health Care Education/Training Program

## 2022-04-19 ENCOUNTER — Other Ambulatory Visit (HOSPITAL_BASED_OUTPATIENT_CLINIC_OR_DEPARTMENT_OTHER): Payer: Self-pay | Admitting: Internal Medicine

## 2022-04-19 DIAGNOSIS — R29898 Other symptoms and signs involving the musculoskeletal system: Secondary | ICD-10-CM | POA: Insufficient documentation

## 2022-04-19 DIAGNOSIS — M545 Low back pain, unspecified: Secondary | ICD-10-CM | POA: Insufficient documentation

## 2022-04-19 DIAGNOSIS — G8929 Other chronic pain: Secondary | ICD-10-CM | POA: Insufficient documentation

## 2022-04-19 DIAGNOSIS — R208 Other disturbances of skin sensation: Secondary | ICD-10-CM | POA: Insufficient documentation

## 2022-04-19 DIAGNOSIS — M542 Cervicalgia: Secondary | ICD-10-CM | POA: Insufficient documentation

## 2022-04-19 DIAGNOSIS — M48061 Spinal stenosis, lumbar region without neurogenic claudication: Secondary | ICD-10-CM | POA: Insufficient documentation

## 2022-04-19 DIAGNOSIS — M549 Dorsalgia, unspecified: Secondary | ICD-10-CM | POA: Insufficient documentation

## 2022-04-19 MED ORDER — OMEPRAZOLE 20 MG PO CPDR
20.0000 mg | DELAYED_RELEASE_CAPSULE | Freq: Every day | ORAL | 3 refills | Status: DC
Start: 2022-04-19 — End: 2023-03-12

## 2022-04-19 NOTE — Telephone Encounter (Signed)
[facility], Samantha Cameron is a 66 year old female has requested a refill of      -  Omeprazole       Last Office Visit: 03/21/2022 with [name]  Last Physical Exam: 08/15/2015     A1C due on 01/03/2022     Other Med Adult:  Most Recent BP Reading(s)  03/21/22 : 104/67        Cholesterol (mg/dL)   Date Value   10/03/2021 162     LOW DENSITY LIPOPROTEIN DIRECT (mg/dL)   Date Value   10/03/2021 72     HIGH DENSITY LIPOPROTEIN (mg/dL)   Date Value   10/03/2021 53     TRIGLYCERIDES (mg/dL)   Date Value   10/03/2021 217 (H)         THYROID SCREEN TSH REFLEX FT4 (uIU/mL)   Date Value   05/26/2020 2.040         No results found for: TSH    HEMOGLOBIN A1C (%)   Date Value   10/03/2021 6.5 (H)       No results found for: POCA1C      INR (no units)   Date Value   04/18/2017 1.0   05/27/2016 1.0   02/01/2012 < 1.0 (L)       SODIUM (mmol/L)   Date Value   02/02/2021 136       POTASSIUM (mmol/L)   Date Value   02/02/2021 4.0           CREATININE (mg/dL)   Date Value   02/02/2021 0.8        Documented patient preferred pharmacies:    [facility], [place] [facility] [place]  Phone: [phone] Fax: [phone]

## 2022-04-22 NOTE — Progress Notes (Signed)
Acute neck pain [M54.2]      S: Pt states she is doing about the same overall.     O: Refer to Rehabilitation Treatment Flowsheet below.       A:  Pt had significant TTP in bilateral upper traps doing massage, especially no the Left side.  Compliant with HEP.  Will benefit from continued skilled PT.                 P:  Continue with POC.       04/18/22 1100   Language Information   Language of Care English   Precautions   Precautions Yes   Other   Other (Comments) L Knee replacement about 14 yrs ago,  Anxiety,  Diastolic dysfunction with chronic heart failure ([place]), DM II, Chronic LBP   Rehab Discipline   Rehab Discipline PT   Visit   Visit number 3   POC Due date By 05/06/22   Manual Therapy 2   Technique STM to Cervical Spine and B upper traps   Time in minutes 8 min   Ther Exercise   Exercise Cervial AROM   Reps 10   Sets 2   Holds flex/abd, rot R/L   Ther Exercise 2   Exercise Scap squeeze   Reps 2 10   Sets 2 2   Hot pack   Joints Cervical Spine   Position Seated   Time in minutes 6 min   Patient Education   What was taught? HEP   Method Verbal;Demo;Practice;Written   Patient comprehension Yes         Richarda Osmond, PT, Lic # [id number]

## 2022-04-23 ENCOUNTER — Ambulatory Visit (HOSPITAL_BASED_OUTPATIENT_CLINIC_OR_DEPARTMENT_OTHER): Payer: Non-veteran care | Admitting: Rehabilitative and Restorative Service Providers"

## 2022-04-25 ENCOUNTER — Ambulatory Visit: Payer: Medicare Other | Attending: Internal Medicine | Admitting: Internal Medicine

## 2022-04-25 ENCOUNTER — Other Ambulatory Visit: Payer: Self-pay

## 2022-04-25 VITALS — BP 111/83 | HR 109 | Temp 98.8°F | Wt 209.0 lb

## 2022-04-25 DIAGNOSIS — J329 Chronic sinusitis, unspecified: Secondary | ICD-10-CM | POA: Diagnosis present

## 2022-04-25 DIAGNOSIS — H66002 Acute suppurative otitis media without spontaneous rupture of ear drum, left ear: Secondary | ICD-10-CM | POA: Insufficient documentation

## 2022-04-25 DIAGNOSIS — B9789 Other viral agents as the cause of diseases classified elsewhere: Secondary | ICD-10-CM | POA: Diagnosis present

## 2022-04-25 MED ORDER — AMOXICILLIN 500 MG PO CAPS
1000.0000 mg | ORAL_CAPSULE | Freq: Three times a day (TID) | ORAL | 0 refills | Status: AC
Start: 2022-04-25 — End: 2022-05-02

## 2022-04-25 MED ORDER — SALINE NASAL SPRAY 0.65 % NA SOLN
1.00 | NASAL | 0 refills | Status: AC | PRN
Start: 2022-04-25 — End: 2022-05-25

## 2022-04-25 MED ORDER — LORATADINE 10 MG PO TABS
10.00 mg | ORAL_TABLET | Freq: Every day | ORAL | 1 refills | Status: AC
Start: 2022-04-25 — End: 2022-07-26

## 2022-04-25 MED ORDER — FLUTICASONE PROPIONATE 50 MCG/ACT NA SUSP
2.00 | Freq: Every day | NASAL | 1 refills | Status: AC
Start: 2022-04-25 — End: 2022-06-25

## 2022-04-25 NOTE — Progress Notes (Signed)
66 YO F presents with ear pain     S:    The patient complains of ear pain and frontal headache x 1 week. She states that initially her grandson was sick and then she started to develop rhinorrhea and congestion. This lead to left sided ear pain and frontal headache. The patient feels as though she has limited hearing in the left ear and experienced one episode of positional dizziness. There has been no drainage from the ear canal. She denies fever, cough, vomiting, chest pain, palpitations, shortness of breath. Denies slurred speech, gait changes, arm weakness, jaw pain, visual changes. She has been using flonase 1 spray in each nostril for 4 days without relief. The patient has not had to use her inhaler during this time.     The patient does have a history of seasonal allergies that she used to use claritin for. She reports having a similar presentation in the past but today the ear pain is worse.      Review of Systems   Constitutional: Positive for malaise/fatigue. Negative for diaphoresis and fever.   HENT: Positive for congestion, ear pain, hearing loss and sinus pain. Negative for ear discharge.    Eyes: Negative for photophobia.   Respiratory: Negative for cough.    Cardiovascular: Negative for chest pain and palpitations.   Skin: Negative for rash.   Neurological: Positive for headaches. Negative for dizziness, speech change and weakness.        O:   BP 111/83   Pulse 109   Temp 98.8 F (37.1 C) (Temporal)   Wt 94.8 kg (209 lb)   LMP 10/22/2007 (LMP Unknown)   SpO2 97%   BMI 38.23 kg/m        General: Well-appearing, in NAD.  Skin: Warm, dry, no rashes or skin lesions.   HEENT: NCAT, EOMI, conjunctiva clear, hearing intact to voice, moist mucous membranes. Tenderness to percussion over the frontal and maxillary sinus.   Left external ear: Tenderness to palpation of the tragus and pain with insertion of the otoscope.   Left TM: impacted cerumen to the inferior aspect of the canal. Visibile portion  of the TM opaque with bulge. Unable to r/o perforation.   Right TM: Complete cerumen impaction. Unable to visualize TM  Nare: Deviated septum  Neck: Full ROM of neck.   CV: RRR, no M/R/G.   Pulm: CTA bilaterally, no W/R/R, not using accessory muscles of respiration.   Extrem: Full ROM of all extremities.   Neuro: Awake, alert, gait and balance normal.  No facial droop or tongue deivation. Normal RAM's. 2+ brachioradialis reflex. Normal sensation bilaterally. 5/5 strength in the right extremities. 4/5 strength in the left extremities not changed since patient was in [facility] in April 2023.   Psych: Euthymic.     A&P:     66 YO F presenting with frontal sinus pressure and ear fullness and pain in light of a viral URI.          (J32.9,  B97.89) Viral sinusitis  Comment: The patient's headache is described as a frontal pressure with positive tenderness to percussion over the frontal and maxillary sinuses. There is no photophobia or unilateralization suggestive of migraine. Her neurological exam was normal with the exception of unchanged mild left sided weakness, making a central etiology of the HA unlikely. The most likely etiology of her HA is viral sinusitis. The duration of the symptoms do not meet the criteria for bacterial sinusitis. So far, the pressure has  not been relieved with an intranasal corticoid but we will increase the dosage and add on an antihistamine and saline nasal spray. While the etiology of her ear pain may be multifactorial, the viral sinusitis can be causing referred pain to the ear and using these medications should work as well.   Plan: COVID-19 OUTPATIENT, sodium chloride (OCEAN)         0.65 % nasal spray, fluticasone (FLONASE) 50         MCG/ACT nasal spray, loratadine (CLARITIN) 10         MG tablet, COVID-19 OUTPATIENT        Increase flonase to 2 sprays in each nostril daily    (H65.02) Non-recurrent acute serous otitis media of left ear  Comment: The patient is presenting with one week of  left ear pain and some left sided hearing deficit. She denies drainage from the ear canal. The patient's presentation has both aspects of inner and outer ear infection but on examination there is opacity with mild bulging of the left TM consistent with otitis media. The etiology of her ear infection is probably multifactorial being of bacterial and viral etiology due to her concurrent viral sinusitis and new ear pain. We will treat with amoxicillin.   Plan: amoxicillin (AMOXIL) 500 MG capsule            Avoid q tips in the ear canal    Dutch Quint, PA-S   Discussed with Dr. Garth Bigness    04/25/22

## 2022-04-26 DIAGNOSIS — M4727 Other spondylosis with radiculopathy, lumbosacral region: Secondary | ICD-10-CM | POA: Diagnosis not present

## 2022-04-26 DIAGNOSIS — M461 Sacroiliitis, not elsewhere classified: Secondary | ICD-10-CM | POA: Diagnosis not present

## 2022-04-26 DIAGNOSIS — M5136 Other intervertebral disc degeneration, lumbar region: Secondary | ICD-10-CM | POA: Diagnosis not present

## 2022-04-26 LAB — COVID-19 OUTPATIENT: COVID-19 OUTPATIENT: NEGATIVE

## 2022-04-27 ENCOUNTER — Encounter (HOSPITAL_BASED_OUTPATIENT_CLINIC_OR_DEPARTMENT_OTHER): Payer: Self-pay

## 2022-04-30 ENCOUNTER — Ambulatory Visit
Payer: Non-veteran care | Attending: Rehabilitative and Restorative Service Providers" | Admitting: Rehabilitative and Restorative Service Providers"

## 2022-04-30 ENCOUNTER — Other Ambulatory Visit: Payer: Self-pay

## 2022-04-30 DIAGNOSIS — M545 Low back pain, unspecified: Secondary | ICD-10-CM | POA: Diagnosis present

## 2022-04-30 DIAGNOSIS — M542 Cervicalgia: Secondary | ICD-10-CM

## 2022-04-30 DIAGNOSIS — G8929 Other chronic pain: Secondary | ICD-10-CM | POA: Diagnosis present

## 2022-04-30 DIAGNOSIS — R29898 Other symptoms and signs involving the musculoskeletal system: Secondary | ICD-10-CM | POA: Diagnosis present

## 2022-04-30 DIAGNOSIS — M48061 Spinal stenosis, lumbar region without neurogenic claudication: Secondary | ICD-10-CM | POA: Diagnosis present

## 2022-04-30 DIAGNOSIS — M549 Dorsalgia, unspecified: Secondary | ICD-10-CM | POA: Diagnosis present

## 2022-04-30 DIAGNOSIS — R208 Other disturbances of skin sensation: Secondary | ICD-10-CM | POA: Diagnosis present

## 2022-04-30 NOTE — Progress Notes (Signed)
(  M54.2) Acute neck pain  (primary encounter diagnosis)      S:  Feeling about the same.  Looking to the left hurts every time.  Looking to the Right feels tight. Said tape applied at the session before the last  was helpful for three days    O: Refer to Rehabilitation Treatment Flowsheet below.       A:  Pt has about 75% rotation ROM in each direction.  On June 19th has "test injection" scheduled with her pain management specialist for her back apin.. Initiated shoulder shrugs--they were painful on the Left side.   Small rubber cupping used today for STM for a gentle pressure--pt still had some, but less than with manual massage.  Icing applied at the end with pt reporting some relief of symptoms.  Pt instructed to ice daily. Will benefit from continued skilled PT.               P:  Continue with POC.       04/30/22 1200   Language Information   Language of Care English   Precautions   Precautions Yes   Other   Other (Comments) L Knee replacement about 14 yrs ago,  Anxiety,  Diastolic dysfunction with chronic heart failure ([place]), DM II, Chronic LBP   Rehab Discipline   Rehab Discipline PT   Visit   Visit number 4   POC Due date By 05/06/22   Manual Therapy 2   Technique STM to L traps/cervical paraspinals using suction cups   Time in minutes 8 min   Ther Exercise   Exercise Scap squeeze standing   Reps 10   Sets 1   Holds 5 sec   Ther Exercise 2   Exercise Cervical AROM   Holds 2 flex/center; ext/center   Ther Exercise 3   Exercise 3 Shoulder Shrugs   Reps 3 10   Sets 3 2   Holds 3 painful on L   Ther Exercise 4   Exercise 4 Shoulder Circles   Reps 4 10   Sets 4 2   Cold pack   Joints Cervical Spine   Position Seated   Time in minutes 10   Patient Education   What was taught? HEP   Method Verbal;Demo;Practice   Patient comprehension Yes         Richarda Osmond, PT, Lic # [id number]

## 2022-05-07 DIAGNOSIS — M47817 Spondylosis without myelopathy or radiculopathy, lumbosacral region: Secondary | ICD-10-CM | POA: Diagnosis not present

## 2022-05-09 ENCOUNTER — Other Ambulatory Visit: Payer: Self-pay

## 2022-05-10 ENCOUNTER — Other Ambulatory Visit: Payer: Self-pay

## 2022-05-10 ENCOUNTER — Ambulatory Visit (HOSPITAL_BASED_OUTPATIENT_CLINIC_OR_DEPARTMENT_OTHER): Payer: Non-veteran care | Admitting: Rehabilitative and Restorative Service Providers"

## 2022-05-10 DIAGNOSIS — R29898 Other symptoms and signs involving the musculoskeletal system: Secondary | ICD-10-CM | POA: Insufficient documentation

## 2022-05-10 DIAGNOSIS — R208 Other disturbances of skin sensation: Secondary | ICD-10-CM | POA: Insufficient documentation

## 2022-05-10 DIAGNOSIS — M549 Dorsalgia, unspecified: Secondary | ICD-10-CM | POA: Diagnosis present

## 2022-05-10 DIAGNOSIS — M545 Low back pain, unspecified: Secondary | ICD-10-CM | POA: Diagnosis not present

## 2022-05-10 DIAGNOSIS — G8929 Other chronic pain: Secondary | ICD-10-CM | POA: Insufficient documentation

## 2022-05-10 DIAGNOSIS — M48061 Spinal stenosis, lumbar region without neurogenic claudication: Secondary | ICD-10-CM | POA: Diagnosis present

## 2022-05-10 DIAGNOSIS — M542 Cervicalgia: Secondary | ICD-10-CM | POA: Diagnosis present

## 2022-05-13 NOTE — Progress Notes (Signed)
05/10/22 1900   Language Information   Language of Care English   Precautions   Precautions Yes   Other   Other (Comments) L Knee replacement about 14 yrs ago,  Anxiety,  Diastolic dysfunction with chronic heart failure ([place]), DM II, Chronic LBP   Rehab Discipline   Rehab Discipline PT   Visit   Visit number Recert 5   POC Due date By 06/09/22   Manual Therapy   Technique Manual Cervical Traction w/ suboccipital release   Time in minutes 3 min   Manual Therapy 2   Technique STM to L upper traps   Time in minutes 6 min   Manual Therapy 3   Technique Cervical PROM in supine   Time in minutes 3 min   Ther Exercise   Exercise UE bike seated   Holds 3 min  fwd, 1 min  back   Ther Exercise 2   Exercise Cervical AROM   Holds 2 flex/ext, rot R/L   Ther Exercise 3   Exercise 3 Recert   Cold pack   Joints Cervical Spine   Position Seated   Time in minutes 10   Other   Other Treatments Miscellaneous   Miscellaneous K tape to L upper traps and L sided neck   Patient Education   What was taught? HEP   Method Verbal;Demo;Practice   Patient comprehension Yes       Richarda Osmond, PT, Lic # [id number]
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
## 2022-05-14 ENCOUNTER — Encounter (HOSPITAL_BASED_OUTPATIENT_CLINIC_OR_DEPARTMENT_OTHER): Payer: Self-pay

## 2022-05-14 ENCOUNTER — Encounter (HOSPITAL_BASED_OUTPATIENT_CLINIC_OR_DEPARTMENT_OTHER): Payer: Self-pay | Admitting: Psychiatry

## 2022-05-14 NOTE — Progress Notes (Signed)
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     Patient last attended clinical visit on 03/13/19. She did not continue to engage in psychopharm care.    RISK ASSESSMENTS:  Suicide: low (1)  Violence: low (1)   Addiction: low (1)     Plan: Termination of psychopharm treatment with this provider. Requires a new referral for psychopharm service.         Melida Gimenez, MD, MPH  [facility]

## 2022-05-17 ENCOUNTER — Ambulatory Visit
Payer: Non-veteran care | Attending: Rehabilitative and Restorative Service Providers" | Admitting: Rehabilitative and Restorative Service Providers"

## 2022-05-17 ENCOUNTER — Other Ambulatory Visit: Payer: Self-pay

## 2022-05-17 DIAGNOSIS — M542 Cervicalgia: Secondary | ICD-10-CM

## 2022-05-17 DIAGNOSIS — M545 Low back pain, unspecified: Secondary | ICD-10-CM | POA: Diagnosis not present

## 2022-05-20 NOTE — Progress Notes (Signed)
Acute neck pain [M54.2]      S: No adverse reactions from last PT session. Patient reportes being compliant with HEP.      O: Refer to Rehabilitation Treatment Flowsheet below.       A:  Tolerated well.  L sided neck and traps pain improving, had more pain during STM at the intrascap region.  Will benefit from continued  skilled PT.                         P:  Continue with POC.       05/17/22 1230   Language Information   Language of Care English   Precautions   Precautions Yes   Other   Other (Comments) L Knee replacement 14 yrs ago, Anxiety, Diastolic Dysfunciton with chronic heart failure ([place]), DM II, Chronic LBP   Rehab Discipline   Rehab Discipline PT   Visit   Visit number 6   POC Due date By 06/09/22   Manual Therapy 2   Technique STM to L upper traps, cervical spine   Time in minutes 8 min   Ther Exercise   Exercise UE Bike seated   Holds 2/2   Ther Exercise 2   Exercise Cervical AROM   Holds 2 flex/ext, rot R/L   Ther Exercise 3   Exercise 3 RTB rows/ext   Reps 3 10   Sets 3 2   Cold pack   Joints Cervical Spine   Position Seated   Time in minutes 10   Patient Education   What was taught? HEP   Method Verbal;Demo;Practice   Patient comprehension Yes       Richarda Osmond, PT, Lic # [id number]

## 2022-05-23 ENCOUNTER — Other Ambulatory Visit: Payer: Self-pay

## 2022-05-23 ENCOUNTER — Ambulatory Visit (HOSPITAL_BASED_OUTPATIENT_CLINIC_OR_DEPARTMENT_OTHER): Payer: Non-veteran care | Admitting: Rehabilitative and Restorative Service Providers"

## 2022-05-23 DIAGNOSIS — M549 Dorsalgia, unspecified: Secondary | ICD-10-CM | POA: Diagnosis present

## 2022-05-23 DIAGNOSIS — R29898 Other symptoms and signs involving the musculoskeletal system: Secondary | ICD-10-CM | POA: Insufficient documentation

## 2022-05-23 DIAGNOSIS — M48061 Spinal stenosis, lumbar region without neurogenic claudication: Secondary | ICD-10-CM | POA: Insufficient documentation

## 2022-05-23 DIAGNOSIS — M542 Cervicalgia: Secondary | ICD-10-CM

## 2022-05-23 DIAGNOSIS — R208 Other disturbances of skin sensation: Secondary | ICD-10-CM | POA: Diagnosis present

## 2022-05-23 DIAGNOSIS — M545 Low back pain, unspecified: Secondary | ICD-10-CM | POA: Insufficient documentation

## 2022-05-23 DIAGNOSIS — G8929 Other chronic pain: Secondary | ICD-10-CM | POA: Insufficient documentation

## 2022-05-23 NOTE — Progress Notes (Signed)
Acute neck pain [M54.2]      S:  No adverse reactions from last PT session. Patient reportes being compliant with HEP.      O: Refer to Rehabilitation Treatment Flowsheet below.       A:  Pt continues to make slow, but steady improvements in neck pain levels. Icing at home frequently.  Less TTP in L upper traps today, had some TTP in thoracic spine, but tolerable.  Will benefit from continued skilled PT,          P:  Continue with POC.       05/23/22 1300   Language Information   Language of Care English   Precautions   Precautions Yes   Other   Other (Comments) L Knee replacement 14 yrs ago, Anxiety, Diastolic Dysfunciton with chronic heart failure ([place]), DM II, Chronic LBP   Rehab Discipline   Rehab Discipline PT   Visit   Visit number 7   POC Due date By 06/09/22   Manual Therapy   Technique STM to periscapular area in prone   Time in minutes 7 min   Manual Therapy 2   Technique PA mobs t spine gr 1-2   Time in minutes 3 min   Ther Exercise   Exercise UE Bike seated   Holds 2/2   Ther Exercise 2   Exercise Cervical AROM   Reps 2 10   Sets 2 1   Holds 2 flex/ext, rot R/L   Ther Exercise 3   Exercise 3 Pball wall rolls   Reps 3 10   Sets 3 2   Ther Exercise 4   Exercise 4 Pball wall squats   Reps 4 10   Sets 4 2   Patient Education   What was taught? HEP   Method Verbal;Demo;Practice   Patient comprehension Yes       Richarda Osmond, PT, Lic # [id number]

## 2022-05-24 DIAGNOSIS — M5416 Radiculopathy, lumbar region: Secondary | ICD-10-CM | POA: Diagnosis not present

## 2022-05-24 DIAGNOSIS — M47817 Spondylosis without myelopathy or radiculopathy, lumbosacral region: Secondary | ICD-10-CM | POA: Diagnosis not present

## 2022-05-30 ENCOUNTER — Other Ambulatory Visit: Payer: Self-pay

## 2022-05-30 ENCOUNTER — Ambulatory Visit (HOSPITAL_BASED_OUTPATIENT_CLINIC_OR_DEPARTMENT_OTHER): Payer: Non-veteran care | Admitting: Rehabilitative and Restorative Service Providers"

## 2022-05-30 DIAGNOSIS — M542 Cervicalgia: Secondary | ICD-10-CM

## 2022-05-30 DIAGNOSIS — M545 Low back pain, unspecified: Secondary | ICD-10-CM | POA: Diagnosis not present

## 2022-05-30 DIAGNOSIS — M47817 Spondylosis without myelopathy or radiculopathy, lumbosacral region: Secondary | ICD-10-CM | POA: Diagnosis not present

## 2022-05-30 DIAGNOSIS — F40231 Fear of injections and transfusions: Secondary | ICD-10-CM | POA: Diagnosis not present

## 2022-05-30 NOTE — Progress Notes (Unsigned)
A  Lot of pain on the left side, including couldn't had difficulty moving her arm, now it's feelint better.    I can't even pick up a gallon of mild.    Had a test on her back, once the anastesia wore off was in a lot of pain for her lower back.       Richarda Osmond, PT, Lic # [id number]          No primary diagnosis found.      S:  No adverse reactions from last PT session. Patient reportes being compliant with HEP.      O: Refer to Rehabilitation Treatment Flowsheet below.       A:  Pt continues to make slow, but steady improvements in neck pain levels. Icing at home frequently.  Less TTP in L upper traps today, had some TTP in thoracic spine, but tolerable.  Will benefit from continued skilled PT,          P:  Continue with POC.       05/23/22 1300   Language Information   Language of Care English   Precautions   Precautions Yes   Other   Other (Comments) L Knee replacement 14 yrs ago, Anxiety, Diastolic Dysfunciton with chronic heart failure ([place]), DM II, Chronic LBP   Rehab Discipline   Rehab Discipline PT   Visit   Visit number 7   POC Due date By 06/09/22   Manual Therapy   Technique STM to periscapular area in prone   Time in minutes 7 min   Manual Therapy 2   Technique PA mobs t spine gr 1-2   Time in minutes 3 min   Ther Exercise   Exercise UE Bike seated   Holds 2/2   Ther Exercise 2   Exercise Cervical AROM   Reps 2 10   Sets 2 1   Holds 2 flex/ext, rot R/L   Ther Exercise 3   Exercise 3 Pball wall rolls   Reps 3 10   Sets 3 2   Ther Exercise 4   Exercise 4 Pball wall squats   Reps 4 10   Sets 4 2   Patient Education   What was taught? HEP   Method Verbal;Demo;Practice   Patient comprehension Yes       Richarda Osmond, PT, Lic # [id number]

## 2022-05-31 ENCOUNTER — Telehealth (HOSPITAL_BASED_OUTPATIENT_CLINIC_OR_DEPARTMENT_OTHER): Payer: Self-pay

## 2022-05-31 NOTE — Telephone Encounter (Signed)
URI COVEY [id number], 66 year old, female    Calls today:  Clinical Questions (NON-SICK CLINICAL QUESTIONS ONLY)    Name of person calling [facility]   Specific nature of request had sent over a clinical notes request in may for patients April 23rd appt has not received any thing needs those sent over FAX# [phone]   Return phone number [phone]      Patient's language of care: English    Patient does not need an interpreter.    Patient's PCP: Driscilla Grammes., MD    Primary Care Home Site:  [facility]

## 2022-05-31 NOTE — Telephone Encounter (Signed)
Message to clericals for f/u

## 2022-06-03 NOTE — Progress Notes (Deleted)
05/30/22 1330   Language Information   Language of Care English   Precautions   Precautions Yes   Other   Other (Comments) L Knee replacement 14 yrs ago, Anxiety, Diastolic Dysfunciton with chronic heart failure ([place]), DM II, Chronic LBP   Rehab Discipline   Rehab Discipline PT   Visit   Visit number 8   POC Due date By 06/09/22   Manual Therapy   Technique STM tgo periscap area   Time in minutes 8 mmin   Ther Exercise   Exercise UE Bike seated   Holds 2/2   Ther Exercise 2   Exercise Cervical AROM   Reps 2 10   Sets 2 2   Ther Exercise 3   Exercise 3 Prone Y, T, I   Reps 3 10   Sets 3 2   Holds 3 Hold 5 sec   Patient Education   What was taught? HEP   Method Verbal;Demo;Practice   Patient comprehension Yes       Richarda Osmond, PT, Lic # [id number]

## 2022-06-05 ENCOUNTER — Ambulatory Visit (HOSPITAL_BASED_OUTPATIENT_CLINIC_OR_DEPARTMENT_OTHER): Payer: Non-veteran care | Admitting: Rehabilitative and Restorative Service Providers"

## 2022-06-06 DIAGNOSIS — F40231 Fear of injections and transfusions: Secondary | ICD-10-CM | POA: Diagnosis not present

## 2022-06-06 DIAGNOSIS — M47817 Spondylosis without myelopathy or radiculopathy, lumbosacral region: Secondary | ICD-10-CM | POA: Diagnosis not present

## 2022-06-07 ENCOUNTER — Ambulatory Visit: Payer: Medicare Other | Attending: Internal Medicine | Admitting: Physician Assistant

## 2022-06-07 ENCOUNTER — Ambulatory Visit: Payer: Medicare Other | Attending: Ophthalmology | Admitting: Ophthalmology

## 2022-06-07 ENCOUNTER — Other Ambulatory Visit: Payer: Self-pay

## 2022-06-07 ENCOUNTER — Encounter (HOSPITAL_BASED_OUTPATIENT_CLINIC_OR_DEPARTMENT_OTHER): Payer: Self-pay | Admitting: Ophthalmology

## 2022-06-07 VITALS — BP 115/80 | HR 110 | Temp 97.3°F | Wt 206.0 lb

## 2022-06-07 DIAGNOSIS — Z961 Presence of intraocular lens: Secondary | ICD-10-CM | POA: Insufficient documentation

## 2022-06-07 DIAGNOSIS — H9202 Otalgia, left ear: Secondary | ICD-10-CM | POA: Diagnosis not present

## 2022-06-07 DIAGNOSIS — E119 Type 2 diabetes mellitus without complications: Secondary | ICD-10-CM | POA: Diagnosis present

## 2022-06-07 DIAGNOSIS — H9203 Otalgia, bilateral: Secondary | ICD-10-CM

## 2022-06-07 MED ORDER — AMOXICILLIN-POT CLAVULANATE 875-125 MG PO TABS
1.00 | ORAL_TABLET | Freq: Two times a day (BID) | ORAL | 0 refills | Status: AC
Start: 2022-06-07 — End: 2022-06-14

## 2022-06-07 MED ORDER — CARBAMIDE PEROXIDE 6.5 % OT SOLN
5.00 [drp] | Freq: Two times a day (BID) | OTIC | 0 refills | Status: AC
Start: 2022-06-07 — End: 2022-06-11

## 2022-06-07 NOTE — Progress Notes (Signed)
f/u pseudophakia OD 01/24/2022,OS 03/09/2020 and for diabetic eye exam to evaluate for retinopathy    HEMOGLOBIN A1C (%)   Date Value   10/03/2021 6.5 (H)   02/02/2021 6.3 (H)   05/26/2020 6.1 (H)       No results found for: POCA1C    Impression,    1. Pseudophakia OU-doing well. Very good corrected vision    2. AODM (Metformin)     Fundus photos-No heme or exudates    Macula OCT-No thickening/DME    Plan-  The patient was instructed to return to [facility] yearly for a dilated fundus exam and to aim for good glucose control to reduce the risk of retinopathy.       RTC 1 year/prn

## 2022-06-07 NOTE — Progress Notes (Signed)
Samantha Cameron is a 66 year old female pt of Dr. [name], Suann Larry., MD who presents for ear pain       Ear pain  Yesterday  L jaw, then L ear   Hard time hearing  Treated for AOM L side last month, sxs resolved but now returned   Denies fever, discharge, nasal congestion, cough, tooth pain        BP 115/80   Pulse 110   Temp 97.3 F (36.3 C) (Temporal)   Wt 93.4 kg (206 lb)   LMP 10/22/2007 (LMP Unknown)   SpO2 96%   BMI 37.68 kg/m   Pain Score: Data Unavailable              Physical Exam  Vitals reviewed.   Constitutional:       General: She is not in acute distress.     Appearance: Normal appearance.   HENT:      Head: Normocephalic and atraumatic.      Right Ear: There is impacted cerumen.      Ears:      Comments: L TM erythematous with slight buldge  Cardiovascular:      Rate and Rhythm: Normal rate and regular rhythm.      Heart sounds: Normal heart sounds. No murmur heard.  Pulmonary:      Effort: Pulmonary effort is normal. No respiratory distress.      Breath sounds: Normal breath sounds. No wheezing, rhonchi or rales.   Neurological:      Mental Status: She is alert.             ASSESSMENT/PLAN        (H92.02) Ear pain, left  (primary encounter diagnosis)  Comment: exam consistent with AOM L side, but cannot see R TM due to cerumen. Just treated for AOM last month as well - sxs did resolve but now returned.  Plan: amoxicillin-clavulanate (AUGMENTIN) 875-125 MG         per tablet        carbamide peroxide (DEBROX) 6.5 % otic solution        F/u if not improving         If she were to develop another episode of AOM in the near future would refer to ENT to further assess        We discussed the patient's current medications. The patient expressed understanding and no barriers to adherence were identified.   1. The patient indicates understanding of these issues and agrees with the plan. Brief care plan is updated and reviewed with the patient.   2. The patient is given an After Visit Summary  sheet that lists all medications with directions, allergies, orders placed during this encounter, and follow-up instructions.   3. I reviewed the patient's medical information and medical history   4. I reconciled the patient's medication list and prepared and supplied needed refills.   5. I have reviewed the past medical, family, and social history sections including the medications and allergies.

## 2022-06-07 NOTE — Progress Notes (Signed)
Here for F/U for Pseudophakia OU(OD 01/24/2022,OS 03/09/2020),H/O DM-no retinopathy,Myopia,Astigmatism,Presbyopia.  Macula OCT and Fundus photo done today OU.   Vision is good OU.   No pain.   No flashes/floaters.  HEMOGLOBIN A1C (%)   Date Value   10/03/2021 6.5 (H)   02/02/2021 6.3 (H)   05/26/2020 6.1 (H)       No results found for: POCA1C     Ocular meds   None

## 2022-06-13 ENCOUNTER — Ambulatory Visit
Payer: Non-veteran care | Attending: Rehabilitative and Restorative Service Providers" | Admitting: Rehabilitative and Restorative Service Providers"

## 2022-06-13 ENCOUNTER — Other Ambulatory Visit: Payer: Self-pay

## 2022-06-13 DIAGNOSIS — R29898 Other symptoms and signs involving the musculoskeletal system: Secondary | ICD-10-CM | POA: Diagnosis present

## 2022-06-13 DIAGNOSIS — M545 Low back pain, unspecified: Secondary | ICD-10-CM | POA: Diagnosis not present

## 2022-06-13 DIAGNOSIS — M48061 Spinal stenosis, lumbar region without neurogenic claudication: Secondary | ICD-10-CM | POA: Diagnosis present

## 2022-06-13 DIAGNOSIS — R208 Other disturbances of skin sensation: Secondary | ICD-10-CM | POA: Diagnosis present

## 2022-06-13 DIAGNOSIS — M542 Cervicalgia: Secondary | ICD-10-CM

## 2022-06-13 DIAGNOSIS — M549 Dorsalgia, unspecified: Secondary | ICD-10-CM | POA: Diagnosis present

## 2022-06-13 DIAGNOSIS — G8929 Other chronic pain: Secondary | ICD-10-CM | POA: Diagnosis present

## 2022-06-15 NOTE — Progress Notes (Signed)
Discharge Summary  Diagnosis: (M54.2) Acute neck pain  (primary encounter diagnosis)      S: "I am doing much bett3er"    O: Refer to Rehabilitation Treatment Flowsheet below.       A:   Pt has completed 9 PT visits.  Her neck pain has improved significantly and is minimal now.  Her lower back pain is chronic, and is being followed by pain management--pt had injections recently.  Will continue independently with HE{ and icing.  Pt verbalized agreement and understanding.                   P: D/c from skilled PT.        06/13/22 1330   Language Information   Language of Care English   Precautions   Precautions Yes   Other   Other (Comments) L Knee replacement about 14 yrs ago,  Anxiety,  Diastolic dysfunction with chronic heart failure ([place]), DM I   Rehab Discipline   Rehab Discipline PT   Visit   Visit number D/c 9   Time Calculation   Start Time 1330   Stop Time 1400   Time Calculation (min) 30 min   Manual Therapy   Technique STM to B upper traps cervical paraspinals   Time in minutes 8 min   Ther Exercise   Exercise UE Bike seated   Holds 2/2   Ther Exercise 2   Exercise Cervical AROM   Reps 2 10   Sets 2 2   Cold pack   Joints   (Left Upper traps)   Time in minutes 10 min   Patient Education   What was taught? HEP   Method Verbal;Demo;Practice   Patient comprehension Yes       Richarda Osmond, PT, Lic # [id number]

## 2022-06-21 DIAGNOSIS — M5136 Other intervertebral disc degeneration, lumbar region: Secondary | ICD-10-CM | POA: Diagnosis not present

## 2022-06-21 DIAGNOSIS — M4727 Other spondylosis with radiculopathy, lumbosacral region: Secondary | ICD-10-CM | POA: Diagnosis not present

## 2022-06-23 ENCOUNTER — Encounter (HOSPITAL_BASED_OUTPATIENT_CLINIC_OR_DEPARTMENT_OTHER): Payer: Self-pay | Admitting: Physician Assistant

## 2022-06-30 ENCOUNTER — Other Ambulatory Visit (HOSPITAL_BASED_OUTPATIENT_CLINIC_OR_DEPARTMENT_OTHER): Payer: Self-pay | Admitting: Internal Medicine

## 2022-06-30 DIAGNOSIS — E1129 Type 2 diabetes mellitus with other diabetic kidney complication: Secondary | ICD-10-CM

## 2022-07-11 ENCOUNTER — Other Ambulatory Visit (HOSPITAL_BASED_OUTPATIENT_CLINIC_OR_DEPARTMENT_OTHER): Payer: Self-pay | Admitting: Internal Medicine

## 2022-07-11 DIAGNOSIS — E1129 Type 2 diabetes mellitus with other diabetic kidney complication: Secondary | ICD-10-CM

## 2022-07-19 ENCOUNTER — Telehealth (HOSPITAL_BASED_OUTPATIENT_CLINIC_OR_DEPARTMENT_OTHER): Payer: Self-pay | Admitting: Internal Medicine

## 2022-07-19 DIAGNOSIS — M4727 Other spondylosis with radiculopathy, lumbosacral region: Secondary | ICD-10-CM | POA: Diagnosis not present

## 2022-07-19 DIAGNOSIS — Z79891 Long term (current) use of opiate analgesic: Secondary | ICD-10-CM | POA: Diagnosis not present

## 2022-07-19 DIAGNOSIS — M5136 Other intervertebral disc degeneration, lumbar region: Secondary | ICD-10-CM | POA: Diagnosis not present

## 2022-07-19 NOTE — Telephone Encounter (Signed)
Patient has type 2 diabetes and is prescribed metformin  If she has concerns about taking it - she can be booked for an in person visit to discuss and recheck labs

## 2022-07-19 NOTE — Telephone Encounter (Signed)
Patient identification verified by 2 forms.    Call placed to patient she reports she heard it was not good for you and she questions whether she should be taking it.  Relayed message by provider.   She has already scheduled an appt on 07/25/22 will review at [place] visit.   No further questions and or concerns at this time.

## 2022-07-19 NOTE — Telephone Encounter (Signed)
Message to PCP for review  Please advise

## 2022-07-19 NOTE — Telephone Encounter (Signed)
Samantha Cameron [id number], 66 year old, female    Calls today:  Clinical Questions ([place])    Name of person calling [name]  Specific nature of request requesting to speak with nurse or pcp regarding prescription Metformin. Wants to know if she should take it or not. Please advise and thank you.  Return phone number [phone]        Patient's language of care: English    Patient does not need an interpreter.    Patient's PCP: Driscilla Grammes., MD    Primary Care Home Site:  [facility]

## 2022-07-25 ENCOUNTER — Other Ambulatory Visit: Payer: Self-pay

## 2022-07-25 ENCOUNTER — Ambulatory Visit: Payer: Medicare Other | Attending: Internal Medicine | Admitting: Internal Medicine

## 2022-07-25 VITALS — BP 114/60 | HR 103 | Temp 98.0°F | Wt 209.0 lb

## 2022-07-25 DIAGNOSIS — E669 Obesity, unspecified: Secondary | ICD-10-CM | POA: Diagnosis present

## 2022-07-25 DIAGNOSIS — R809 Proteinuria, unspecified: Secondary | ICD-10-CM | POA: Diagnosis present

## 2022-07-25 DIAGNOSIS — D126 Benign neoplasm of colon, unspecified: Secondary | ICD-10-CM

## 2022-07-25 DIAGNOSIS — G4733 Obstructive sleep apnea (adult) (pediatric): Secondary | ICD-10-CM | POA: Insufficient documentation

## 2022-07-25 DIAGNOSIS — Z1231 Encounter for screening mammogram for malignant neoplasm of breast: Secondary | ICD-10-CM | POA: Insufficient documentation

## 2022-07-25 DIAGNOSIS — E1129 Type 2 diabetes mellitus with other diabetic kidney complication: Secondary | ICD-10-CM | POA: Insufficient documentation

## 2022-07-25 DIAGNOSIS — R002 Palpitations: Secondary | ICD-10-CM | POA: Diagnosis present

## 2022-07-25 MED ORDER — METOPROLOL SUCCINATE ER 25 MG PO TB24
25.0000 mg | ORAL_TABLET | Freq: Every day | ORAL | 3 refills | Status: DC
Start: 2022-07-25 — End: 2023-03-12

## 2022-07-25 MED ORDER — METFORMIN HCL ER 500 MG PO TB24
500.0000 mg | ORAL_TABLET | Freq: Every day | ORAL | 3 refills | Status: DC
Start: 2022-07-25 — End: 2022-09-17

## 2022-07-25 NOTE — Patient Instructions (Addendum)
Call the following number to schedule your appointment:    Samantha Cameron [phone]    --------------------------------------  Mammogram  Radiology/Imaging [phone]
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
## 2022-07-30 ENCOUNTER — Other Ambulatory Visit (HOSPITAL_BASED_OUTPATIENT_CLINIC_OR_DEPARTMENT_OTHER): Payer: Self-pay | Admitting: Internal Medicine

## 2022-07-30 MED ORDER — DICLOFENAC SODIUM 1 % EX GEL
2.00 g | Freq: Two times a day (BID) | CUTANEOUS | 1 refills | Status: AC
Start: 2022-07-30 — End: 2022-08-29

## 2022-07-30 NOTE — Telephone Encounter (Signed)
PER Pharmacy, Samantha Cameron is a 66 year old female has requested a refill of diclofenac gel.      Last Office Visit: 07/25/2022 Driscilla Grammes., MD     Last Physical Exam: 08/15/2015    A1C due on 01/03/2022    Other Med Adult:  Most Recent BP Reading(s)  07/25/22 : 114/60        Cholesterol (mg/dL)   Date Value   10/03/2021 162     LOW DENSITY LIPOPROTEIN DIRECT (mg/dL)   Date Value   10/03/2021 72     HIGH DENSITY LIPOPROTEIN (mg/dL)   Date Value   10/03/2021 53     TRIGLYCERIDES (mg/dL)   Date Value   10/03/2021 217 (H)         THYROID SCREEN TSH REFLEX FT4 (uIU/mL)   Date Value   05/26/2020 2.040         No results found for: TSH    HEMOGLOBIN A1C (%)   Date Value   10/03/2021 6.5 (H)       No results found for: POCA1C      INR (no units)   Date Value   04/18/2017 1.0   05/27/2016 1.0   02/01/2012 < 1.0 (L)       SODIUM (mmol/L)   Date Value   02/02/2021 136       POTASSIUM (mmol/L)   Date Value   02/02/2021 4.0           CREATININE (mg/dL)   Date Value   02/02/2021 0.8       Documented patient preferred pharmacies:    [facility] [address], [place] - [place]  Phone: [phone] Fax: [phone]

## 2022-08-13 ENCOUNTER — Other Ambulatory Visit: Payer: Self-pay

## 2022-08-13 ENCOUNTER — Other Ambulatory Visit (HOSPITAL_BASED_OUTPATIENT_CLINIC_OR_DEPARTMENT_OTHER): Payer: Self-pay | Admitting: Cardiovascular Disease

## 2022-08-13 ENCOUNTER — Ambulatory Visit: Payer: Medicare Other | Attending: Internal Medicine | Admitting: Cardiovascular Disease

## 2022-08-13 VITALS — BP 101/67 | HR 106 | Temp 97.2°F | Ht 60.0 in | Wt 210.0 lb

## 2022-08-13 DIAGNOSIS — I471 Supraventricular tachycardia, unspecified: Secondary | ICD-10-CM

## 2022-08-13 DIAGNOSIS — I10 Essential (primary) hypertension: Secondary | ICD-10-CM

## 2022-08-13 DIAGNOSIS — F17200 Nicotine dependence, unspecified, uncomplicated: Secondary | ICD-10-CM

## 2022-08-13 DIAGNOSIS — R9431 Abnormal electrocardiogram [ECG] [EKG]: Secondary | ICD-10-CM | POA: Diagnosis present

## 2022-08-13 DIAGNOSIS — G4733 Obstructive sleep apnea (adult) (pediatric): Secondary | ICD-10-CM | POA: Diagnosis present

## 2022-08-13 DIAGNOSIS — D126 Benign neoplasm of colon, unspecified: Secondary | ICD-10-CM | POA: Insufficient documentation

## 2022-08-13 NOTE — Patient Instructions (Signed)
STOP torsemide - if you have any swelling or increase in shortness of breath then restart it and let me know   Ask Dr [name] if he agrees that you can stop aspirin   Look into Chantix

## 2022-08-13 NOTE — Telephone Encounter (Signed)
PER Pharmacy,NAME@ is a 66 year old female has requested a refill of Torsemide.      - Please review, Torsemide has been discontinued/marked Historical on 01/24/2022.        Last Office Visit: 08/13/2022 with Newman Nip  Last Physical Exam: 08/15/2015       Other Med Adult:  Most Recent BP Reading(s)  08/13/22 : 101/67        Cholesterol (mg/dL)   Date Value   10/03/2021 162     LOW DENSITY LIPOPROTEIN DIRECT (mg/dL)   Date Value   10/03/2021 72     HIGH DENSITY LIPOPROTEIN (mg/dL)   Date Value   10/03/2021 53     TRIGLYCERIDES (mg/dL)   Date Value   10/03/2021 217 (H)         THYROID SCREEN TSH REFLEX FT4 (uIU/mL)   Date Value   05/26/2020 2.040         No results found for: TSH    HEMOGLOBIN A1C (%)   Date Value   10/03/2021 6.5 (H)       No results found for: POCA1C      INR (no units)   Date Value   04/18/2017 1.0   05/27/2016 1.0   02/01/2012 < 1.0 (L)       SODIUM (mmol/L)   Date Value   02/02/2021 136       POTASSIUM (mmol/L)   Date Value   02/02/2021 4.0           CREATININE (mg/dL)   Date Value   02/02/2021 0.8        Documented patient preferred pharmacies:    [facility] [address], [place] - [place]  Phone: [phone] Fax: [phone]

## 2022-08-13 NOTE — Addendum Note (Signed)
Addended by: Ileene Rubens on: 08/13/2022 01:20 PM     Modules accepted: Level of Service
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
## 2022-08-14 LAB — EKG

## 2022-08-16 DIAGNOSIS — M4727 Other spondylosis with radiculopathy, lumbosacral region: Secondary | ICD-10-CM | POA: Diagnosis not present

## 2022-08-22 DIAGNOSIS — M47817 Spondylosis without myelopathy or radiculopathy, lumbosacral region: Secondary | ICD-10-CM | POA: Diagnosis not present

## 2022-08-22 DIAGNOSIS — F40231 Fear of injections and transfusions: Secondary | ICD-10-CM | POA: Diagnosis not present

## 2022-09-13 DIAGNOSIS — M4727 Other spondylosis with radiculopathy, lumbosacral region: Secondary | ICD-10-CM | POA: Diagnosis not present

## 2022-09-13 DIAGNOSIS — Z79891 Long term (current) use of opiate analgesic: Secondary | ICD-10-CM | POA: Diagnosis not present

## 2022-09-13 DIAGNOSIS — M5136 Other intervertebral disc degeneration, lumbar region: Secondary | ICD-10-CM | POA: Diagnosis not present

## 2022-09-17 ENCOUNTER — Other Ambulatory Visit (HOSPITAL_BASED_OUTPATIENT_CLINIC_OR_DEPARTMENT_OTHER): Payer: Self-pay | Admitting: Internal Medicine

## 2022-09-17 DIAGNOSIS — E1129 Type 2 diabetes mellitus with other diabetic kidney complication: Secondary | ICD-10-CM

## 2022-09-17 MED ORDER — METFORMIN HCL ER 500 MG PO TB24
1000.0000 mg | ORAL_TABLET | Freq: Every day | ORAL | 3 refills | Status: DC
Start: 2022-09-17 — End: 2023-03-12

## 2022-09-17 NOTE — Telephone Encounter (Signed)
PER Patient (self), Samantha Cameron is a 66 year old female has requested a refill of-  metformin    Patient call today wanted to mention she is dizzy and due to low blood sugar. Wanted to see if you can increase the strength       Last Office Visit: 07/25/22 with pcp  Last Physical Exam: 08/15/15     A1C due on 01/03/2022     Other Med Adult:  Most Recent BP Reading(s)  08/13/22 : 101/67        Cholesterol (mg/dL)   Date Value   10/03/2021 162     LOW DENSITY LIPOPROTEIN DIRECT (mg/dL)   Date Value   10/03/2021 72     HIGH DENSITY LIPOPROTEIN (mg/dL)   Date Value   10/03/2021 53     TRIGLYCERIDES (mg/dL)   Date Value   10/03/2021 217 (H)         THYROID SCREEN TSH REFLEX FT4 (uIU/mL)   Date Value   05/26/2020 2.040         No results found for: TSH    HEMOGLOBIN A1C (%)   Date Value   10/03/2021 6.5 (H)       No results found for: POCA1C      INR (no units)   Date Value   04/18/2017 1.0   05/27/2016 1.0   02/01/2012 < 1.0 (L)       SODIUM (mmol/L)   Date Value   02/02/2021 136       POTASSIUM (mmol/L)   Date Value   02/02/2021 4.0           CREATININE (mg/dL)   Date Value   02/02/2021 0.8        Documented patient preferred pharmacies:    [facility] - [address], [place] - [place]  Phone: [phone] Fax: [phone]

## 2022-09-20 ENCOUNTER — Encounter (HOSPITAL_BASED_OUTPATIENT_CLINIC_OR_DEPARTMENT_OTHER): Payer: Self-pay | Admitting: Family Medicine

## 2022-09-20 ENCOUNTER — Other Ambulatory Visit: Payer: Self-pay

## 2022-09-20 ENCOUNTER — Telehealth (HOSPITAL_BASED_OUTPATIENT_CLINIC_OR_DEPARTMENT_OTHER): Payer: Self-pay

## 2022-09-20 ENCOUNTER — Ambulatory Visit: Payer: Medicare Other | Attending: Internal Medicine | Admitting: Family Medicine

## 2022-09-20 VITALS — BP 120/80 | HR 95 | Temp 98.0°F | Wt 214.0 lb

## 2022-09-20 DIAGNOSIS — E1129 Type 2 diabetes mellitus with other diabetic kidney complication: Secondary | ICD-10-CM | POA: Insufficient documentation

## 2022-09-20 DIAGNOSIS — Z20822 Contact with and (suspected) exposure to covid-19: Secondary | ICD-10-CM | POA: Diagnosis not present

## 2022-09-20 DIAGNOSIS — R809 Proteinuria, unspecified: Secondary | ICD-10-CM | POA: Diagnosis present

## 2022-09-20 DIAGNOSIS — R0981 Nasal congestion: Secondary | ICD-10-CM | POA: Diagnosis present

## 2022-09-20 DIAGNOSIS — J029 Acute pharyngitis, unspecified: Secondary | ICD-10-CM | POA: Insufficient documentation

## 2022-09-20 LAB — RAPID STREP (POINT OF CARE): STREP SCREEN: NEGATIVE

## 2022-09-20 LAB — COVID-19 OUTPATIENT: COVID-19 OUTPATIENT: NEGATIVE

## 2022-09-20 MED ORDER — FLUTICASONE PROPIONATE 50 MCG/ACT NA SUSP
1.00 | Freq: Every day | NASAL | 2 refills | Status: AC
Start: 2022-09-20 — End: 2022-10-20

## 2022-09-20 MED ORDER — GUAIFENESIN ER 600 MG PO TB12
1200.00 mg | ORAL_TABLET | Freq: Two times a day (BID) | ORAL | 0 refills | Status: AC
Start: 2022-09-20 — End: 2022-09-30
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
## 2022-09-20 NOTE — Assessment & Plan Note (Signed)
Due for diabetes f/u - last A1c 10/03/2021: 6.5%.   Currently managed on metformin 1000 mg qam and atorvastatin.   Given current symptoms today - will schedule labs visit to return to complete labs.

## 2022-09-20 NOTE — Telephone Encounter (Addendum)
I called the patient to offer the following     I was able to reach the patient and assisted them with the needs listed below.    Please copy and paste the provider's request below:    ----- Message from Pablo Ledger, PA-C sent at 09/20/2022  9:10 AM EDT -----  Regarding: lab visit  Please outreach to schedule patient for a lab visit next week.  Pablo Ledger, PA-C, 09/20/2022

## 2022-09-22 LAB — THROAT CULTURE BETA STREP

## 2022-09-25 ENCOUNTER — Encounter (HOSPITAL_BASED_OUTPATIENT_CLINIC_OR_DEPARTMENT_OTHER): Payer: Self-pay | Admitting: Student in an Organized Health Care Education/Training Program

## 2022-09-25 ENCOUNTER — Ambulatory Visit
Payer: No Typology Code available for payment source | Attending: Student in an Organized Health Care Education/Training Program | Admitting: Student in an Organized Health Care Education/Training Program

## 2022-09-25 DIAGNOSIS — A048 Other specified bacterial intestinal infections: Secondary | ICD-10-CM | POA: Insufficient documentation

## 2022-09-25 DIAGNOSIS — Z1211 Encounter for screening for malignant neoplasm of colon: Secondary | ICD-10-CM | POA: Diagnosis present

## 2022-09-25 DIAGNOSIS — D126 Benign neoplasm of colon, unspecified: Secondary | ICD-10-CM | POA: Insufficient documentation
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
## 2022-09-26 ENCOUNTER — Encounter (HOSPITAL_BASED_OUTPATIENT_CLINIC_OR_DEPARTMENT_OTHER): Payer: Self-pay

## 2022-09-27 ENCOUNTER — Other Ambulatory Visit: Payer: Self-pay

## 2022-09-27 ENCOUNTER — Ambulatory Visit: Payer: Medicare Other | Attending: Family Medicine

## 2022-09-27 DIAGNOSIS — E1129 Type 2 diabetes mellitus with other diabetic kidney complication: Secondary | ICD-10-CM

## 2022-09-27 DIAGNOSIS — R809 Proteinuria, unspecified: Secondary | ICD-10-CM | POA: Diagnosis present

## 2022-09-27 LAB — BASIC METABOLIC PANEL
ANION GAP: 9 mmol/L — ABNORMAL LOW (ref 10–22)
BUN (UREA NITROGEN): 10 mg/dL (ref 7–18)
CALCIUM: 10 mg/dL (ref 8.5–10.5)
CARBON DIOXIDE: 26 mmol/L (ref 21–32)
CHLORIDE: 104 mmol/L (ref 98–107)
CREATININE: 0.6 mg/dL (ref 0.4–1.2)
ESTIMATED GLOMERULAR FILT RATE: >60 mL/min (ref >60)
Glucose Random: 88 mg/dL (ref 74–160)
POTASSIUM: 4.6 mmol/L (ref 3.5–5.1)
SODIUM: 139 mmol/L (ref 136–145)

## 2022-09-27 LAB — MICROALBUMIN RANDOM URINE
ALB/CREAT RATIO URINE RAN: 876 ug/mg — ABNORMAL HIGH (ref 0–30)
ALBUMIN URINE RANDOM: 33.2 mg/dL — ABNORMAL HIGH (ref 0.0–1.9)
CREATININE RANDOM URINE: 38 mg/dL (ref 28–217)

## 2022-09-27 LAB — LIPID PANEL
Cholesterol: 166 mg/dL (ref 0–239)
HIGH DENSITY LIPOPROTEIN: 47 mg/dL (ref 40–60)
LOW DENSITY LIPOPROTEIN DIRECT: 83 mg/dL (ref 0–189)
TRIGLYCERIDES: 268 mg/dL — ABNORMAL HIGH (ref 0–150)

## 2022-09-27 LAB — HEMOGLOBIN A1C
ESTIMATED AVERAGE GLUCOSE: 137 mg/dL (ref 74–160)
HEMOGLOBIN A1C: 6.4 % — ABNORMAL HIGH (ref 4.0–5.6)

## 2022-09-27 NOTE — Progress Notes (Signed)
Blood drawn   Urine collected  Gifford Shave, 09/27/2022

## 2022-09-28 ENCOUNTER — Telehealth (HOSPITAL_BASED_OUTPATIENT_CLINIC_OR_DEPARTMENT_OTHER): Payer: Self-pay

## 2022-09-28 NOTE — Telephone Encounter (Addendum)
Patient identification verified by 2 forms.    Called placed to patient relayed message below.   Patient verbalized understanding and agreed with plan.   Denied any further questions or concerns at this time.       ----- Message from Pablo Ledger, PA-C sent at 09/28/2022 11:52 AM EST -----  Dear RN,     Please:    1. Create Telephone encounter for this patient  2. Share with the patient     Hemoglobin A1c testing was 6.4% - indicating well controlled diabetes. Continue with metformin 1000 mg qam and atorvastatin.     Annual microalbumin testing shows protein in urine. She should continue with lisinopril for kidney protection, and we will repeat testing for monitoring.     Kidney and electrolyte testing was normal.  Lipid testing was well controlled - continues with atorvastatin daily.       Type of Outreach: 3 phone calls and if unable to reach send letter     Document the conversation in the Telephone Encounter and send the encounter back to me to complete orders and any further necessary documentation.      Thank you,  Pablo Ledger, PA-C

## 2022-10-03 ENCOUNTER — Encounter (HOSPITAL_BASED_OUTPATIENT_CLINIC_OR_DEPARTMENT_OTHER): Payer: Self-pay

## 2022-10-03 NOTE — Progress Notes (Signed)
Completed.

## 2022-10-10 DIAGNOSIS — M4727 Other spondylosis with radiculopathy, lumbosacral region: Secondary | ICD-10-CM | POA: Diagnosis not present

## 2022-10-14 ENCOUNTER — Other Ambulatory Visit (HOSPITAL_BASED_OUTPATIENT_CLINIC_OR_DEPARTMENT_OTHER): Payer: Self-pay | Admitting: Internal Medicine

## 2022-10-15 NOTE — Telephone Encounter (Signed)
This prescription refill request has passed the Rx Renewal Authorization protocol.  This medication has been approved and sent to the patient's preferred pharmacy.      PER Pharmacy, Samantha Cameron is a 66 year old female has requested a refill of atorvastatin.      Last Office Visit: 09/20/22 with Valerie Salts   Last Physical Exam: 08/15/2015    There are no preventive care reminders to display for this patient.    Other Med Adult:  Most Recent BP Reading(s)  09/20/22 : 120/80        Cholesterol (mg/dL)   Date Value   09/27/2022 166     LOW DENSITY LIPOPROTEIN DIRECT (mg/dL)   Date Value   09/27/2022 83     HIGH DENSITY LIPOPROTEIN (mg/dL)   Date Value   09/27/2022 47     TRIGLYCERIDES (mg/dL)   Date Value   09/27/2022 268 (H)         THYROID SCREEN TSH REFLEX FT4 (uIU/mL)   Date Value   05/26/2020 2.040         No results found for: "TSH"    HEMOGLOBIN A1C (%)   Date Value   09/27/2022 6.4 (H)       No results found for: "POCA1C"      INR (no units)   Date Value   04/18/2017 1.0   05/27/2016 1.0   02/01/2012 < 1.0 (L)       SODIUM (mmol/L)   Date Value   09/27/2022 139       POTASSIUM (mmol/L)   Date Value   09/27/2022 4.6           CREATININE (mg/dL)   Date Value   09/27/2022 0.6       Documented patient preferred pharmacies:    [facility] - [address], [place] - [place]  Phone: [phone] Fax: [phone]

## 2022-10-18 ENCOUNTER — Ambulatory Visit
Admission: RE | Admit: 2022-10-18 | Discharge: 2022-10-18 | Disposition: A | Discharge status: Home / Self Care | Payer: Medicare Other | Attending: Internal Medicine | Admitting: Internal Medicine

## 2022-10-18 ENCOUNTER — Other Ambulatory Visit: Payer: Self-pay

## 2022-10-18 ENCOUNTER — Encounter (HOSPITAL_BASED_OUTPATIENT_CLINIC_OR_DEPARTMENT_OTHER): Payer: Self-pay

## 2022-10-18 DIAGNOSIS — Z1231 Encounter for screening mammogram for malignant neoplasm of breast: Secondary | ICD-10-CM | POA: Diagnosis present

## 2022-10-25 ENCOUNTER — Telehealth (HOSPITAL_BASED_OUTPATIENT_CLINIC_OR_DEPARTMENT_OTHER): Payer: Self-pay | Admitting: Internal Medicine

## 2022-10-25 ENCOUNTER — Other Ambulatory Visit (HOSPITAL_BASED_OUTPATIENT_CLINIC_OR_DEPARTMENT_OTHER): Payer: Self-pay | Admitting: Internal Medicine

## 2022-10-25 MED ORDER — LANCETS
11 refills | Status: DC
Start: 2022-10-25 — End: 2023-03-12

## 2022-10-25 MED ORDER — GLUCOSE BLOOD VI STRP
ORAL_STRIP | 11 refills | Status: DC
Start: 2022-10-25 — End: 2023-03-12

## 2022-10-25 NOTE — Telephone Encounter (Signed)
Request sent to me that patient wishes to have DM supplies sent to "[facility]"  Called - patient reports she was told she could not get these at [facility] anymore  Encouraged her to have external supply company fax me an order/rx form  Alternatively could try to send to other local pharmacy - patient wishes to try this - sent to [facility]

## 2022-10-31 DIAGNOSIS — M5417 Radiculopathy, lumbosacral region: Secondary | ICD-10-CM | POA: Diagnosis not present

## 2022-11-08 DIAGNOSIS — Z79891 Long term (current) use of opiate analgesic: Secondary | ICD-10-CM | POA: Diagnosis not present

## 2022-11-08 DIAGNOSIS — M4727 Other spondylosis with radiculopathy, lumbosacral region: Secondary | ICD-10-CM | POA: Diagnosis not present

## 2022-11-08 DIAGNOSIS — M5136 Other intervertebral disc degeneration, lumbar region: Secondary | ICD-10-CM | POA: Diagnosis not present

## 2022-11-22 ENCOUNTER — Other Ambulatory Visit (HOSPITAL_BASED_OUTPATIENT_CLINIC_OR_DEPARTMENT_OTHER): Payer: Self-pay | Admitting: Internal Medicine

## 2022-11-22 NOTE — Telephone Encounter (Signed)
PER Pharmacy, Samantha Cameron is a 67 year old female has requested a refill of        albuterol HFA 108 (90 Base) MCG/ACT inhaler       Last Office Visit:09/20/22  with Myriam Jacobson  Last Physical Exam: 08/15/15     There are no preventive care reminders to display for this patient.     Other Med Adult:  Most Recent BP Reading(s)  09/20/22 : 120/80        Cholesterol (mg/dL)   Date Value   09/27/2022 166     LOW DENSITY LIPOPROTEIN DIRECT (mg/dL)   Date Value   09/27/2022 83     HIGH DENSITY LIPOPROTEIN (mg/dL)   Date Value   09/27/2022 47     TRIGLYCERIDES (mg/dL)   Date Value   09/27/2022 268 (H)         THYROID SCREEN TSH REFLEX FT4 (uIU/mL)   Date Value   05/26/2020 2.040         No results found for: "TSH"    HEMOGLOBIN A1C (%)   Date Value   09/27/2022 6.4 (H)       No results found for: "POCA1C"      INR (no units)   Date Value   04/18/2017 1.0   05/27/2016 1.0   02/01/2012 < 1.0 (L)       SODIUM (mmol/L)   Date Value   09/27/2022 139       POTASSIUM (mmol/L)   Date Value   09/27/2022 4.6           CREATININE (mg/dL)   Date Value   09/27/2022 0.6        Documented patient preferred pharmacies:    [facility] - [address], [place] - [place]  Phone: [phone] Fax: [phone]

## 2022-11-23 MED ORDER — ALBUTEROL SULFATE HFA 108 (90 BASE) MCG/ACT IN AERS
2.0000 | INHALATION_SPRAY | Freq: Four times a day (QID) | RESPIRATORY_TRACT | 1 refills | Status: DC | PRN
Start: 2022-11-23 — End: 2023-03-12

## 2022-12-06 DIAGNOSIS — M4727 Other spondylosis with radiculopathy, lumbosacral region: Secondary | ICD-10-CM | POA: Diagnosis not present

## 2022-12-14 DIAGNOSIS — M4726 Other spondylosis with radiculopathy, lumbar region: Secondary | ICD-10-CM | POA: Diagnosis not present

## 2022-12-14 DIAGNOSIS — M48061 Spinal stenosis, lumbar region without neurogenic claudication: Secondary | ICD-10-CM | POA: Diagnosis not present

## 2022-12-18 ENCOUNTER — Ambulatory Visit: Payer: Medicare Other | Attending: Student in an Organized Health Care Education/Training Program

## 2022-12-18 ENCOUNTER — Other Ambulatory Visit: Payer: Self-pay

## 2022-12-18 DIAGNOSIS — A048 Other specified bacterial intestinal infections: Secondary | ICD-10-CM | POA: Insufficient documentation

## 2022-12-18 NOTE — Progress Notes (Signed)
Collected stool  Arleen Bar D. Laurita Quint, 12/18/2022

## 2022-12-19 LAB — HELICOBACTER PYLORI STOOL AG

## 2022-12-21 ENCOUNTER — Encounter (HOSPITAL_BASED_OUTPATIENT_CLINIC_OR_DEPARTMENT_OTHER): Payer: Self-pay | Admitting: Student in an Organized Health Care Education/Training Program

## 2023-01-03 DIAGNOSIS — M4727 Other spondylosis with radiculopathy, lumbosacral region: Secondary | ICD-10-CM | POA: Diagnosis not present

## 2023-01-23 ENCOUNTER — Encounter (HOSPITAL_BASED_OUTPATIENT_CLINIC_OR_DEPARTMENT_OTHER): Payer: Self-pay

## 2023-01-30 ENCOUNTER — Telehealth (HOSPITAL_BASED_OUTPATIENT_CLINIC_OR_DEPARTMENT_OTHER): Payer: Self-pay

## 2023-01-30 NOTE — Telephone Encounter (Signed)
PT-1 Request Confirmation  PT-1 request is submitted  PT-1 Request [phone] is Pending . For pending submissions, please check the portal periodically for updates.  An email notification has been sent to the provider on behalf of whom the PT-1 request was submitted. To update the email address, please visit your profile page.  Below are the details of your pending request.     Trip Summary       Member [place]:  [id number]  [place]   Treatment Details    Treating facility within [facility] Yes   Medical treatment type: Z00-Z99 - Factors influencing health status and contact with health services   Duration: 3 Week(s)   Frequency: 12 visit(s) per Month   Transportation Details    Member will require a wheelchair van No   Member will be accompanied by an escort No   Member will require a service animal No     PT-1 Request Confirmation  PT-1 request is submitted  PT-1 Request [phone] is Pending . For pending submissions, please check the portal periodically for updates.  An email notification has been sent to the provider on behalf of whom the PT-1 request was submitted. To update the email address, please visit your profile page.        Below are the details of your pending request.     Trip Summary       Member [place]:  [id number]  [place]  [address] [place]   Treatment Details    Treating facility within [facility] No   Medical treatment type: Z00-Z99 - Factors influencing health status and contact with health services   Duration: 3 Week(s)   Frequency: 12 visit(s) per [facility] Details    Member will require a wheelchair van No   Member will be accompanied by an escort No   Member will require a service animal No     PT-1 Request Confirmation  PT-1 request is  submitted  PT-1 Request [phone] is Pending . For pending submissions, please check the portal periodically for updates.  An email notification has been sent to the provider on behalf of whom the PT-1 request was submitted. To update the email address, please visit your profile page.        Below are the details of your pending request.     Trip Summary       Member Home:  [id number]  Treating Location:  [place]   Treatment Details    Treating facility within member's locality No   Medical treatment type: Z00-Z99 - Factors influencing health status and contact with health services   Duration: 3 Week(s)   Frequency: 12 visit(s) per Month   Transportation Details    Member will require a wheelchair van No   Member will be accompanied by an escort No   Member will require a service animal No

## 2023-01-31 DIAGNOSIS — Z79891 Long term (current) use of opiate analgesic: Secondary | ICD-10-CM | POA: Diagnosis not present

## 2023-01-31 DIAGNOSIS — M48062 Spinal stenosis, lumbar region with neurogenic claudication: Secondary | ICD-10-CM | POA: Diagnosis not present

## 2023-01-31 DIAGNOSIS — M25561 Pain in right knee: Secondary | ICD-10-CM | POA: Diagnosis not present

## 2023-01-31 DIAGNOSIS — M4727 Other spondylosis with radiculopathy, lumbosacral region: Secondary | ICD-10-CM | POA: Diagnosis not present

## 2023-02-06 DIAGNOSIS — M25561 Pain in right knee: Secondary | ICD-10-CM | POA: Diagnosis not present

## 2023-02-11 ENCOUNTER — Ambulatory Visit (HOSPITAL_BASED_OUTPATIENT_CLINIC_OR_DEPARTMENT_OTHER): Payer: Medicare Other | Admitting: Cardiovascular Disease

## 2023-02-13 DIAGNOSIS — M1711 Unilateral primary osteoarthritis, right knee: Secondary | ICD-10-CM | POA: Diagnosis not present

## 2023-02-14 DIAGNOSIS — M47817 Spondylosis without myelopathy or radiculopathy, lumbosacral region: Secondary | ICD-10-CM | POA: Diagnosis not present

## 2023-02-26 ENCOUNTER — Telehealth (HOSPITAL_BASED_OUTPATIENT_CLINIC_OR_DEPARTMENT_OTHER): Payer: Self-pay

## 2023-02-26 NOTE — Telephone Encounter (Signed)
PT-1 Request Confirmation  PT-1 request is submitted  PT-1 Request [phone] is Authorized  An email notification has been sent to the provider on behalf of whom the PT-1 request was submitted. To update the email address, please visit your profile page.        Following are the details of your trip.     Trip Summary       Member Home:  [phone] Samantha Cameron [facility] [phone]  Treating Location:  [id number] [facility] [id number]   Treatment Details    Treating facility within [facility] locality Yes   Medical treatment type: Z00-Z99 - Factors influencing health status and contact with health services   Duration: 5 Week(s)   Frequency: 12 visit(s) per Month   Transportation Details    Member will require a wheelchair van No   Member will be accompanied by an escort Yes   Member will require a service animal No

## 2023-02-26 NOTE — Telephone Encounter (Signed)
PT-1 Request Confirmation  PT-1 request is submitted  PT-1 Request [phone] is Pending . For pending submissions, please check the portal periodically for updates.  An email notification has been sent to the provider on behalf of whom the PT-1 request was submitted. To update the email address, please visit your profile page.        Below are the details of your pending request.     Trip Summary       Member Home:  [phone] Samantha Cameron [facility] [id number]  Treating Location:  [id number] [facility] [id number]   Treatment Details    Treating facility within member's locality No   Medical treatment type: Z00-Z99 - Factors influencing health status and contact with health services   Duration: 3 Week(s)   Frequency: 12 visit(s) per Month   Transportation Details    Member will require a wheelchair van No   Member will be accompanied by an escort Yes   Member will require a service animal No

## 2023-02-26 NOTE — Telephone Encounter (Signed)
PT-1 Request Confirmation  PT-1 request is submitted  PT-1 Request [phone] is Pending . For pending submissions, please check the portal periodically for updates.  An email notification has been sent to the provider on behalf of whom the PT-1 request was submitted. To update the email address, please visit your profile page.        Below are the details of your pending request.     Trip Summary       Member Home:  [phone] [facility] [id number]  Treating Location:  [id number] [facility] [phone] [place]   Treatment Details    Treating facility within [facility] locality Yes   Medical treatment type: Z00-Z99 - Factors influencing health status and contact with health services   Duration: 3 Week(s)   Frequency: 12 visit(s) per Month   Transportation Details    Member will require a wheelchair van No   Member will be accompanied by an escort Yes   Member will require a service animal No

## 2023-02-26 NOTE — Telephone Encounter (Signed)
I called the patient and Left a voicemail to return call    If patient calls back, please:  To inform that she need to activate her PT-1.  Member currently has no active PT-1s.

## 2023-02-28 DIAGNOSIS — M4727 Other spondylosis with radiculopathy, lumbosacral region: Secondary | ICD-10-CM | POA: Diagnosis not present

## 2023-03-01 ENCOUNTER — Encounter (HOSPITAL_BASED_OUTPATIENT_CLINIC_OR_DEPARTMENT_OTHER): Payer: Self-pay

## 2023-03-01 ENCOUNTER — Ambulatory Visit: Payer: Medicare Other | Attending: Family Medicine | Admitting: Physician Assistant

## 2023-03-01 ENCOUNTER — Other Ambulatory Visit: Payer: Self-pay

## 2023-03-01 ENCOUNTER — Ambulatory Visit (HOSPITAL_BASED_OUTPATIENT_CLINIC_OR_DEPARTMENT_OTHER): Payer: Self-pay

## 2023-03-01 ENCOUNTER — Other Ambulatory Visit (HOSPITAL_BASED_OUTPATIENT_CLINIC_OR_DEPARTMENT_OTHER): Payer: Self-pay

## 2023-03-01 VITALS — BP 118/74 | HR 72 | Wt 221.2 lb

## 2023-03-01 DIAGNOSIS — R252 Cramp and spasm: Secondary | ICD-10-CM | POA: Diagnosis present

## 2023-03-01 DIAGNOSIS — Z760 Encounter for issue of repeat prescription: Secondary | ICD-10-CM | POA: Insufficient documentation

## 2023-03-01 DIAGNOSIS — R809 Proteinuria, unspecified: Secondary | ICD-10-CM | POA: Insufficient documentation

## 2023-03-01 DIAGNOSIS — E1129 Type 2 diabetes mellitus with other diabetic kidney complication: Secondary | ICD-10-CM | POA: Diagnosis present

## 2023-03-01 LAB — BASIC METABOLIC PANEL
ANION GAP: 14 mmol/L (ref 10–22)
BUN (UREA NITROGEN): 14 mg/dL (ref 7–18)
CALCIUM: 10 mg/dL (ref 8.5–10.5)
CARBON DIOXIDE: 25 mmol/L (ref 21–32)
CHLORIDE: 100 mmol/L (ref 98–107)
CREATININE: 0.6 mg/dL (ref 0.4–1.2)
ESTIMATED GLOMERULAR FILT RATE: >60 mL/min (ref >60)
Glucose Random: 94 mg/dL (ref 74–160)
POTASSIUM: 4.6 mmol/L (ref 3.5–5.1)
SODIUM: 138 mmol/L (ref 136–145)

## 2023-03-01 LAB — HEMOGLOBIN A1C
ESTIMATED AVERAGE GLUCOSE: 140 mg/dL (ref 74–160)
HEMOGLOBIN A1C: 6.5 % — ABNORMAL HIGH (ref 4.0–5.6)

## 2023-03-01 MED ORDER — B-COMPLEX/VITAMIN C PO TABS
1.0000 | ORAL_TABLET | Freq: Every day | ORAL | 0 refills | Status: AC
Start: 2023-03-01 — End: 2023-05-30

## 2023-03-01 MED ORDER — LISINOPRIL 5 MG PO TABS
ORAL_TABLET | ORAL | 3 refills | Status: DC
Start: 2023-03-01 — End: 2023-03-09

## 2023-03-01 MED ORDER — PEG 3350-KCL-NABCB-NACL-NASULF 236 G PO SOLR
4.0000 L | ORAL | 0 refills | Status: DC
Start: 2023-03-01 — End: 2023-03-27

## 2023-03-01 MED ORDER — SIMETHICONE 80 MG PO TABS
4.0000 | ORAL_TABLET | ORAL | 0 refills | Status: DC
Start: 2023-03-01 — End: 2023-03-27

## 2023-03-01 NOTE — Telephone Encounter (Signed)
Called patient to confirm colonoscopy procedure for 03/22/23 at [facility].  A PAT appt is made for 03/11/23.  Pt is aware.    Reminded of need for ride home.  Standard/diab Prep  ordered, instructions sent in  my chart.   Also, reminded patient to respond to automated text/call to avoid procedure from possibly being cancelled.

## 2023-03-01 NOTE — Telephone Encounter (Signed)
Reason for Disposition  . Patient wants to be seen    Answer Assessment - Initial Assessment Questions  Pt c.o left lower leg cramps, intermittent x 4 days  "Cramps occur mainly at night or If I turn the wrong way"  "Right below my left calf, I get a Charlie Horse"  Denies leg pain at pres  Pt denies swelling, or redness  Temperature/color of left leg is the same as the right leg per pt  "Cramps up so bad"  +Pain with touch  Denies recent injury or trauma  Denies any other symptoms  Pt states that she is walking normally  Eating and drinking well  Postmenopausal   Pt would like to be seen  Advised per nursing triage protocol.  Verbalized understanding and agreement with instructions and disposition.     Recommended disposition for patient:Disposition: See in Office Today or tomorrow/apt made    If patient referred to UC/ED advised that they may require further follow up and testing after the visit with their primary care office.     Instructed patient to call back for any new, worsening, or worrisome symptoms or concerns any time day or night.    Protocols used: Leg Pain-A-OH

## 2023-03-01 NOTE — Progress Notes (Signed)
Subjective:   CC: Samantha Cameron is a 67 year old female patient who presents today for leg cramps  LANGUAGE: English used effectively by patient.    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   1. GERD - severe 2.                Unintentional weight loss 3. Abdominal pain     Plan: 1.                Recommend EGD. This procedure has been fully reviewed                with the patient and written informed consent has been                 obtained. 2.   No date: Esophageal reflux  No date: External hemorrhoid      Comment:  per steward records  04/19/2017: Gastritis without bleeding      Comment:  GI endoscopy: 06/2018: Mildly severe reflux esophagitis.               Biopsied.      - Chronic gastritis. Biopsied.      - No                gross lesions in the entire examined duodenum.  Complications:  No immediate complications.                Recommendation:      - Await pathology results. 7/2019GI:               Assessment: Pt is 32F with PMH COPD, morbid obesity,                H.pylori (2009, unclear if treated) who p/w severe GERD                symptoms, unintentional weigh  05/10/2018: Gastroesophageal reflux disease without esophagitis  No date: HTN (hypertension)  02/01/2012: Hypoxia  02/16/2017: Left ear pain  06/07/2015: Left genital labial abscess  06/16/2015: Obesity, Class III, 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y) (HCC)  08/16/2015: Osteopenia      Comment:  On xray of left knee per steward records 06/2014 Unclear                if addressed  05/23/2018: Pancreatic lesion  09/14/2015: Right ear impacted cerumen  No date: Sleep apnea  08/15/2015: Type 2 diabetes mellitus without complication, without   long-term current use of insulin (HCC)      Comment:   HEMOGLOBIN A1C (%) Date Value 07/15/2015 6.2 (H)                ----------  Steward record - a1c 6.2 on 01/31/15 6.4 on                09/30/14 5.9 on 12/29/13 6.4 08/18/13   11/18/2017: Viral URI with cough    REVIEW OF SYSTEMS:   See HPI    Objective:     PHYSICAL EXAM:   BP 118/74   Pulse 72   Wt 100.3 kg (221 lb 3.2 oz)   LMP 10/22/2007 (LMP Unknown)   SpO2 97%   BMI 43.20 kg/m   Gen: Well-appearing, speaking normally, in no acute distress  HEENT: Head atraumatic  Pulm: No increased work of breathing  Neuro: Alert and oriented, normal gait. Strength 5/5 in bilateral LE  Psych: Affect euthymic, normal speech  MSK: R leg 17.5 in, L leg 17.5in. No erythema or edema noted in  BLE.    Assessment & Plan:   ASSESSMENT AND PLAN:  #Leg cramp  Likely muscular spasm in legs. No signs of DVT or cellulitis on exam. Discussed importance of hydration and stretching. Will Rx B complex vitamins and obtain BMP to ensure electrolytes are normal. If no improvement with the above,  may consider switching atorvastatin to a statin that has less chance of cramping SE  Plan:   - B Complex-C-Folic Acid (B-COMPLEX/VITAMIN C)         TABS, BASIC METABOLIC PANEL  - RTC if sx persist/worsen/change    #Type 2 diabetes mellitus with microalbuminuria, without long-term current use of insulin (HCC)  #Med refill  Patient requesting refill for lisinopril for kidney health given DM. Will refill and check A1c.   Plan:   - HEMOGLOBIN A1C, lisinopril (ZESTRIL) 5 MG         tablet    The patient expressed understanding and no barriers to adherence were identified. Discussed reasons to call office or seek immediate medical care including: no improvement or worsening of symptoms, new concerns, or questions.     The patient indicates understanding of these issues and agrees with the plan.     I reconciled the patient's medication list and supplied needed refills.  The patient was given an After Visit Summary.    Garfield Cornea, PA-C 03/01/2023

## 2023-03-06 DIAGNOSIS — M1711 Unilateral primary osteoarthritis, right knee: Secondary | ICD-10-CM | POA: Diagnosis not present

## 2023-03-09 ENCOUNTER — Observation Stay
Admission: EM | Admit: 2023-03-09 | Discharge: 2023-03-09 | Disposition: A | Discharge status: Home / Self Care | Payer: Medicare Other | Attending: Internal Medicine | Admitting: Internal Medicine

## 2023-03-09 ENCOUNTER — Other Ambulatory Visit: Payer: Self-pay

## 2023-03-09 ENCOUNTER — Encounter (HOSPITAL_BASED_OUTPATIENT_CLINIC_OR_DEPARTMENT_OTHER): Payer: Self-pay | Admitting: Internal Medicine

## 2023-03-09 DIAGNOSIS — E669 Obesity, unspecified: Secondary | ICD-10-CM | POA: Diagnosis not present

## 2023-03-09 DIAGNOSIS — R220 Localized swelling, mass and lump, head: Secondary | ICD-10-CM | POA: Diagnosis not present

## 2023-03-09 DIAGNOSIS — R079 Chest pain, unspecified: Secondary | ICD-10-CM | POA: Diagnosis not present

## 2023-03-09 DIAGNOSIS — Y92009 Unspecified place in unspecified non-institutional (private) residence as the place of occurrence of the external cause: Secondary | ICD-10-CM

## 2023-03-09 DIAGNOSIS — K648 Other hemorrhoids: Secondary | ICD-10-CM

## 2023-03-09 DIAGNOSIS — G8929 Other chronic pain: Secondary | ICD-10-CM | POA: Diagnosis present

## 2023-03-09 DIAGNOSIS — E119 Type 2 diabetes mellitus without complications: Secondary | ICD-10-CM | POA: Diagnosis present

## 2023-03-09 DIAGNOSIS — X58XXXA Exposure to other specified factors, initial encounter: Secondary | ICD-10-CM | POA: Diagnosis not present

## 2023-03-09 DIAGNOSIS — T783XXA Angioneurotic edema, initial encounter: Secondary | ICD-10-CM | POA: Diagnosis present

## 2023-03-09 DIAGNOSIS — F17200 Nicotine dependence, unspecified, uncomplicated: Secondary | ICD-10-CM

## 2023-03-09 DIAGNOSIS — J449 Chronic obstructive pulmonary disease, unspecified: Secondary | ICD-10-CM | POA: Diagnosis present

## 2023-03-09 DIAGNOSIS — R0600 Dyspnea, unspecified: Secondary | ICD-10-CM | POA: Diagnosis not present

## 2023-03-09 DIAGNOSIS — Z87898 Personal history of other specified conditions: Secondary | ICD-10-CM | POA: Diagnosis present

## 2023-03-09 DIAGNOSIS — Z6841 Body Mass Index (BMI) 40.0 and over, adult: Secondary | ICD-10-CM | POA: Diagnosis not present

## 2023-03-09 DIAGNOSIS — T464X5A Adverse effect of angiotensin-converting-enzyme inhibitors, initial encounter: Secondary | ICD-10-CM | POA: Diagnosis present

## 2023-03-09 DIAGNOSIS — E785 Hyperlipidemia, unspecified: Secondary | ICD-10-CM | POA: Diagnosis not present

## 2023-03-09 LAB — BASIC METABOLIC PANEL
ANION GAP: 11 mmol/L (ref 10–22)
BUN (UREA NITROGEN): 16 mg/dL (ref 7–18)
CALCIUM: 9.3 mg/dL (ref 8.5–10.5)
CARBON DIOXIDE: 23 mmol/L (ref 21–32)
CHLORIDE: 104 mmol/L (ref 98–107)
CREATININE: 0.6 mg/dL (ref 0.4–1.2)
ESTIMATED GLOMERULAR FILT RATE: >60 mL/min (ref >60)
Glucose Random: 98 mg/dL (ref 74–160)
POTASSIUM: 4.2 mmol/L (ref 3.5–5.1)
SODIUM: 138 mmol/L (ref 136–145)

## 2023-03-09 LAB — CBC, PLATELET & DIFFERENTIAL
ABSOLUTE BASO COUNT: 0.1 10*3/uL (ref 0.0–0.1)
ABSOLUTE EOSINOPHIL COUNT: 0.3 10*3/uL (ref 0.0–0.8)
ABSOLUTE IMM GRAN COUNT: 0.02 10*3/uL (ref 0.00–0.10)
ABSOLUTE LYMPH COUNT: 3 10*3/uL (ref 0.6–5.9)
ABSOLUTE MONO COUNT: 0.7 10*3/uL (ref 0.2–1.4)
ABSOLUTE NEUTROPHIL COUNT: 6.1 10*3/uL (ref 1.6–8.3)
ABSOLUTE NRBC COUNT: 0 10*3/uL (ref 0.0–0.0)
BASOPHIL %: 0.6 % (ref 0.0–1.2)
EOSINOPHIL %: 3 % (ref 0.0–7.0)
HEMATOCRIT: 40.9 % (ref 34.1–44.9)
HEMOGLOBIN: 13.4 g/dL (ref 11.2–15.7)
IMMATURE GRANULOCYTE %: 0.2 % (ref 0.0–1.0)
LYMPHOCYTE %: 29.1 % (ref 15.0–54.0)
MEAN CORP HGB CONC: 32.8 g/dL (ref 31.0–37.0)
MEAN CORPUSCULAR HGB: 29.6 pg (ref 26.0–34.0)
MEAN CORPUSCULAR VOL: 90.3 fl (ref 80.0–100.0)
MEAN PLATELET VOLUME: 10.3 fL (ref 8.7–12.5)
MONOCYTE %: 7.1 % (ref 4.0–13.0)
NEUTROPHIL %: 60 % (ref 40.0–75.0)
NRBC %: 0 % (ref 0.0–0.0)
PLATELET COUNT: 252 10*3/uL (ref 150–400)
RBC DISTRIBUTION WIDTH STD DEV: 45.9 fL (ref 35.1–46.3)
RED BLOOD CELL COUNT: 4.53 M/uL (ref 3.90–5.20)
WHITE BLOOD CELL COUNT: 10.2 10*3/uL (ref 4.0–11.0)

## 2023-03-09 LAB — MRSA/MSSA NASAL (PCR): MRSA/MSSA NASAL (PCR): NEGATIVE

## 2023-03-09 MED ORDER — PREDNISONE 20 MG PO TABS
40.0000 mg | ORAL_TABLET | Freq: Every day | ORAL | Status: DC
Start: 2023-03-10 — End: 2023-03-09

## 2023-03-09 MED ORDER — ENOXAPARIN SODIUM 60 MG/0.6ML IJ SOSY
0.5000 mg/kg | PREFILLED_SYRINGE | INTRAMUSCULAR | Status: DC
Start: 2023-03-09 — End: 2023-03-09
  Administered 2023-03-09: 50 mg via SUBCUTANEOUS

## 2023-03-09 MED ORDER — LORAZEPAM 2 MG/ML IJ SOLN
0.50 mg | Freq: Once | INTRAMUSCULAR | Status: AC
Start: 2023-03-09 — End: 2023-03-09
  Administered 2023-03-09: 0.5 mg via INTRAVENOUS
  Filled 2023-03-09: qty 1

## 2023-03-09 MED ORDER — ALBUTEROL SULFATE HFA 108 (90 BASE) MCG/ACT IN AERS
2.0000 | INHALATION_SPRAY | Freq: Four times a day (QID) | RESPIRATORY_TRACT | Status: DC | PRN
Start: 2023-03-09 — End: 2023-03-09

## 2023-03-09 MED ORDER — DEXAMETHASONE SOD PHOSPHATE 10 MG/ML IJ SOLN (SUPER ERX)
10.00 mg | Freq: Once | Status: AC
Start: 2023-03-09 — End: 2023-03-09
  Administered 2023-03-09: 10 mg via INTRAVENOUS
  Filled 2023-03-09: qty 1

## 2023-03-09 MED ORDER — METOPROLOL SUCCINATE ER 25 MG PO TB24
25.0000 mg | ORAL_TABLET | Freq: Every day | ORAL | Status: DC
Start: 2023-03-10 — End: 2023-03-09

## 2023-03-09 MED ORDER — FAMOTIDINE (PF) 20 MG/2ML IV SOLN
20.00 mg | Freq: Once | INTRAVENOUS | Status: AC
Start: 2023-03-09 — End: 2023-03-09
  Administered 2023-03-09: 20 mg via INTRAVENOUS
  Filled 2023-03-09: qty 2

## 2023-03-09 MED ORDER — CARBOXYMETHYLCELLULOSE SODIUM 0.5 % OP SOLN
1.0000 [drp] | Freq: Four times a day (QID) | OPHTHALMIC | Status: DC | PRN
Start: 2023-03-09 — End: 2023-03-09
  Filled 2023-03-09: qty 15

## 2023-03-09 MED ORDER — ATORVASTATIN CALCIUM 20 MG PO TABS
20.0000 mg | ORAL_TABLET | Freq: Every evening | ORAL | Status: DC
Start: 2023-03-09 — End: 2023-03-09

## 2023-03-09 MED ORDER — EPINEPHRINE HCL 1 MG/ML INJECTION
0.30 mg | Freq: Once | INTRAMUSCULAR | Status: AC
Start: 2023-03-09 — End: 2023-03-09
  Administered 2023-03-09: 0.3 mg via INTRAMUSCULAR
  Filled 2023-03-09: qty 1

## 2023-03-09 MED ORDER — ASPIRIN 81 MG PO TBEC
81.0000 mg | DELAYED_RELEASE_TABLET | Freq: Every day | ORAL | Status: DC
Start: 2023-03-10 — End: 2023-03-09

## 2023-03-09 MED ORDER — PREDNISONE 20 MG PO TABS
40.0000 mg | ORAL_TABLET | Freq: Every day | ORAL | 0 refills | Status: DC
Start: 2023-03-10 — End: 2023-03-27

## 2023-03-09 MED ORDER — HYDROMORPHONE HCL 1 MG/ML IJ SOLN (SUPER ERX)
0.50 mg | Freq: Once | Status: AC
Start: 2023-03-09 — End: 2023-03-09
  Administered 2023-03-09: 0.5 mg via INTRAVENOUS
  Filled 2023-03-09: qty 0.5
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
## 2023-03-09 NOTE — Narrator Note (Signed)
Report to Marcelle Smiling, RN in ICU

## 2023-03-09 NOTE — Discharge Inst - Reason you came to the hospital (Addendum)
You came to the hospital for: Swelling of the tongue and difficulty breathing.    During your hospital stay:  You were diagnosed with angioedema likely from your blood pressure medicine, lisinopril.  In the emergency department you were given treatment for this and then admitted to the ICU for observation.  During your time in the ICU the swelling in your tongue resolved and you are able to eat food and drink water without any problem.    PATIENT INSTRUCTIONS  As you transition home, please:  You should take oral prednisone for an additional 3 days to ensure resolution of the swelling.  You should not take lisinopril or other ACE inhibitor's ever again.   You may resume your regular diet and activity level that you have prior to coming to the hospital.    Please see your PCP within one week to discuss: Any remaining symptoms from this hospitalization and any replacement medications for lisinopril, which you should not take over again.    Reasons to call or visit your PCP (or an Emergency Room if PCP unavailable):  --Medication refills  --Severe symptoms of any kind.  FOR EMERGENCIES CALL 911    It was a pleasure to care for you in the hospital.  Hector Shade, MD, and your [facility] Team  03/09/23

## 2023-03-09 NOTE — Narrator Note (Signed)
Pt brought to ICU on monitor this RN and tech without incident.

## 2023-03-09 NOTE — Narrator Note (Signed)
Pt placed on 2L NC for dest to 91% with improvement to mid-high 90s. Pt reports only feeling slight improvement in swelling and breathing,. Muffled voice continues, Dr Lydia Guiles at bedside.

## 2023-03-09 NOTE — ED Provider Notes (Signed)
Patient's mode of arrival was by [facility].  Arrival time: 03/09/23    Chief complaint: Edema          Triage Vital Signs:  ED Triage Vitals   ED Triage Vitals Brief Group      Temp 03/09/23 0800 97.6 F      Pulse 03/09/23 0717 84      Resp 03/09/23 0717 (!) 24      BP 03/09/23 0717 119/78      SpO2 03/09/23 0717 93 %      Pain Score 03/09/23 0717 0            HPI:  67 year old female patient presents for evaluation of left-sided tongue swelling.  States that she went to bed feeling okay.  Woke up around 6 AM with trouble breathing, left tongue is swollen.  Never had this previously.  Does take lisinopril.    States that her posterior oropharynx feels okay.  No lip swelling.  No abdominal pain, nausea, vomiting.  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   1. GERD - severe 2.                 Unintentional weight loss 3. Abdominal pain     Plan: 1.                Recommend EGD. This procedure has been fully reviewed                with the patient and written informed consent has been                obtained. 2.   No date: Esophageal reflux  No date: External hemorrhoid      Comment:  per steward records  04/19/2017: Gastritis without bleeding  Comment:  GI endoscopy: 06/2018: Mildly severe reflux esophagitis.               Biopsied.      - Chronic gastritis. Biopsied.      - No                gross lesions in the entire examined duodenum.                Complications:  No immediate complications.                Recommendation:      - Await pathology results. 7/2019GI:               Assessment: Pt is 14F with PMH COPD, morbid obesity,                H.pylori (2009, unclear if treated) who p/w severe GERD                symptoms, unintentional weigh  05/10/2018: Gastroesophageal reflux disease without esophagitis  No date: HTN (hypertension)  02/01/2012: Hypoxia  02/16/2017: Left ear pain  06/07/2015: Left genital labial abscess  06/16/2015: Obesity, Class III, 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y) (HCC)  08/16/2015: Osteopenia      Comment:  On xray of left knee per steward records 06/2014 Unclear                if addressed  05/23/2018: Pancreatic lesion  09/14/2015: Right ear impacted cerumen  No date: Sleep apnea  08/15/2015: Type 2 diabetes mellitus without complication, without   long-term current use of insulin (HCC)      Comment:   HEMOGLOBIN A1C (%) Date Value 07/15/2015 6.2 (H)                ----------  Steward record - a1c 6.2 on 01/31/15 6.4 on                09/30/14 5.9 on 12/29/13 6.4 08/18/13   11/18/2017: Viral URI with cough    Immunization History:  Immunization History   Administered Date(s) Administered    Covid-19 Vaccine (Moderna - Full Dose) 02/18/2020, 03/17/2020    HEP B ADULT 3 DOSE 20 and > 08/15/2015, 09/14/2015, 05/17/2016    INFLUENZA VIRUS TRI W/PRESV VACCINE 18/> YRS IM  (PRIVATE) 10/14/2007, 09/17/2008, 09/01/2009, 08/07/2010, 08/14/2011, 09/03/2011, 09/12/2012, 08/18/2013, 08/24/2014, 06/30/2015    Influenza Vac Quad (Aiiv4) inact, Adjuv Pre Free 0.52ml 08/18/2021, 07/06/2022    Influenza Vaccine High Dose 0.36ml 65 And Older 06/29/2020    Influenza Virus Quad Presv Free Vacc 6 Mo and Older, IM 06/30/2015, 12/11/2016, 07/02/2017, 08/18/2021    Influenza Virus Quad W/Presv Vacc 6 Mo and Older, IM 06/30/2015, 07/02/2017, 07/01/2018    Influenza Virus Tri Presv Free 3/> YRS IM 07/03/2016    Influenza Virus Vac, Quad (CCIIV4)IM 08/05/2019    PNEUMOCOCCAL POLYSACCHARIDE VACCINE v23 03/12/2014, 08/15/2015, 12/01/2015, 02/02/2021    Pneumococcal 20-(prevnar 20) 04/13/2021    Pneumococcal Vaccine, Conjugate V7 01/31/2009    Td 11/01/2001    Tdap 09/17/2008, 12/11/2016, 04/13/2021    ZOSTER SHINGLES VACC, LIVE SC 08/24/2008, 12/01/2015    Zoster Vacc (HZV) Recomb Adjv, IM, 2 Dose, (SHINGRIX) 05/30/2017, 07/17/2017       Past Surgical History:  Past Surgical History:  No date: CATARACT EXTRACTION EXTRACAPSULAR W/ INTRAOCULAR LENS   IMPLANTATION      Comment:  OS 03/10/20, OD 01/24/22 Dr.S.M.Patalano  No date: CHOLECYSTECTOMY  No date: FOOT SURGERY  Comment:  bilateral  No date: LAPAROSCOPY SURG CHOLECYSTECTOMY  No date: OB ANTEPARTUM CARE CESAREAN DLVR & POSTPARTUM      Comment:  x3  No date: PR ANES NERVE MUSC TENDON FASCIA&BURSA KNEE&/POPLT  No date: TONSILLECTOMY & ADENOIDECTOMY <AGE 62  04/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     Current Outpatient Medications   Medication Sig    B Complex-C-Folic Acid (B-COMPLEX/VITAMIN C) TABS Take 1 each by mouth in the morning.    lisinopril (ZESTRIL) 5 MG tablet take 1 tablet by mouth once daily WITH DINNER    polyethylene glycol (GOLYTELY) 236 g suspension Take 4,000 mLs by mouth See Admin  Instructions Take as directed prior to your colonoscopy    Simethicone 80 MG TABS Take 4 tablets by mouth See Admin Instructions Take as directed prior to colonoscopy.    albuterol HFA 108 (90 Base) MCG/ACT inhaler Inhale 2 puffs into the lungs every 6 (six) hours as needed for Wheezing    glucose blood test strip Use once daily as directed. Dispense test strips covered by insurance - using freestyle lite. Dx e11.29    Lancets Use 1 time daily.  Dispense lancets that are covered by insurance. ICD Code e11.29    atorvastatin (LIPITOR) 20 MG tablet take 1 tablet by mouth once daily    metFORMIN (GLUCOPHAGE-XR) 500 MG 24 hr tablet Take 2 tablets by mouth daily with breakfast    metoprolol (TOPROL-XL) 25 MG 24 hr tablet Take 1 tablet by mouth in the morning.    omeprazole (PRILOSEC) 20 MG capsule Take 1 capsule by mouth in the morning.    carboxymethylcellulose (REFRESH TEARS) 0.5 % SOLN Place 1 drop into both eyes in the morning and 1 drop at noon and 1 drop in the evening and 1 drop before bedtime.    albuterol (PROVENTIL) (2.5 MG/3ML) 0.083% nebulizer solution Take 3 mLs by nebulization every 6 (six) hours as needed for Wheezing Dx: Code: J44.9    cholecalciferol (VITAMIN D3) 1000 UNIT tablet Take 1 tablet by mouth daily    pregabalin (LYRICA) 100 MG capsule Take 100 mg by mouth as needed With Dr Lucianne Muss         oxycodone-acetaminophen (PERCOCET) 5-325 MG per tablet Take 1 tablet by mouth 2 (two) times daily as needed With Dr Lucianne Muss    aspirin 81 MG tablet Take 81 mg by mouth daily.       Social History:  Social History    Tobacco Use      Smoking status: Every Day        Packs/day: 0.50        Years: 31.00        Additional pack years: 0.00        Total pack years: 15.50        Types: Cigarettes        Last attempt to quit: 03/10/2018        Years since quitting: 5.0      Smokeless tobacco: Never      Tobacco comments: quit smoking inform provided to patient    Alcohol use: No      Family History:  Review of patient's  family history indicates:  Problem: Diabetes      Relation: Mother  Age of Onset: (Not Specified)  Problem: Heart      Relation: Brother          Age of Onset: (Not Specified)          Comment: CAD and AAA  Problem: Hypertension      Relation: Mother          Age of Onset: (Not Specified)  Problem: Arthritis      Relation: Mother          Age of Onset: (Not Specified)  Problem: Stroke      Relation: Mother          Age of Onset: (Not Specified)  Problem: Diabetes      Relation: Brother          Age of Onset: (Not Specified)          Comment: same brother  Problem: Cancer - Other      Relation: Father          Age of Onset: (Not Specified)          Comment: type uncertain  Problem: Thyroid      Relation: Sister          Age of Onset: (Not Specified)  Problem: Alcohol/Drug Abuse      Relation: Son          Age of Onset: (Not Specified)  Problem: OTHER      Relation: Son          Age of Onset: (Not Specified)          Comment: ?brain aneurysm per steward records  Problem: No Known Problems      Relation: Grandchild          Age of Onset: (Not Specified)          Comment: 6  Problem: Cancer - Breast      Relation: FamHxNeg          Age of Onset: (Not Specified)  Problem: Cancer - Cervical      Relation: FamHxNeg          Age of Onset: (Not Specified)  Problem: Cancer - Ovarian      Relation: FamHxNeg          Age of Onset: (Not Specified)  Problem: Cancer - Colon      Relation: FamHxNeg          Age of Onset: (Not Specified)  Problem: Psychiatric Illness      Relation: FamHxNeg          Age of Onset: (Not Specified)      Allergies:  Review of Patient's Allergies indicates:   Codeine camsylate       Shortness of Breath, Other (See                            Comments)    Comment:Chest pain   Motrin [ibuprofen]      Nausea Only   Sulfa antibiotics       Other (See Comments)   Bactrim                 Rash, Itching    Physical Exam:   GENERAL: No acute distress, well nourished  HEAD: Atraumatic  EYES: No evidence of  trauma. Sclerae anicteric.  Pupils equal.  ENT: Mucous Membranes Moist.  Left tongue is diffusely swollen, right tongue is normal.  Submental area and neck are normal.  Trouble articulating  words but speech does not sound stridulous.  LUNGS: Clear to auscultation bilaterally.  No significant wheezing.  Respirations unlabored.  HEART: Regular rate & rhythm.   EXTREMITIES/MSK: No notable deformities or trauma.  Warm and well perfused.  SKIN: Warm and dry, no rash  NEUROLOGIC: Alert and oriented x3; moves all extremities well; speaking in clear fluent sentences. Sensation intact to light touch throughout.  PSYCHIATRIC: Appropriate, cooperative.  LYMPH: No significant peripheral edema.    Labs:  Results for orders placed or performed during the hospital encounter of 03/09/23 (from the past 24 hour(s))   CBC, Platelet & Differential    Collection Time: 03/09/23  7:17 AM   Result Value    WHITE BLOOD CELL COUNT 10.2    RED BLOOD CELL COUNT 4.53    HEMOGLOBIN 13.4    HEMATOCRIT 40.9    MEAN CORPUSCULAR VOL 90.3    MEAN CORPUSCULAR HGB 29.6    MEAN CORP HGB CONC 32.8    RBC DISTRIBUTION WIDTH STD DEV 45.9    PLATELET COUNT 252    MEAN PLATELET VOLUME 10.3    NEUTROPHIL % 60.0    IMMATURE GRANULOCYTE % 0.2    LYMPHOCYTE % 29.1    MONOCYTE % 7.1    EOSINOPHIL % 3.0    BASOPHIL % 0.6    NRBC % 0.0    ABSOLUTE NEUTROPHIL COUNT 6.1    ABSOLUTE IMM GRAN COUNT 0.02    ABSOLUTE LYMPH COUNT 3.0    ABSOLUTE MONO COUNT 0.7    ABSOLUTE EOSINOPHIL COUNT 0.3    ABSOLUTE BASO COUNT 0.1    ABSOLUTE NRBC COUNT 0.0   Basic Metabolic Panel    Collection Time: 03/09/23  7:17 AM   Result Value    SODIUM 138    POTASSIUM 4.2    CHLORIDE 104    CARBON DIOXIDE 23    ANION GAP 11    CALCIUM 9.3    Glucose Random 98    BUN (UREA NITROGEN) 16    CREATININE 0.6    ESTIMATED GLOMERULAR FILT RATE > 60       Vital Signs During ED Stay:   03/09/23  0729 03/09/23  0745 03/09/23  0800 03/09/23  0815   BP: 108/62 112/63 108/62 105/74   Pulse: 83 93 82 86    Resp: 17 (!) Temp:   97.6 F    TempSrc:   Oral    SpO2: (!) 91% 96% 96% 94%   Weight:           Meds Given during ED visit:  Medications   EPINEPHrine (ADRENALIN) injection 0.3 mg (0.3 mg Intramuscular Given 03/09/23 0719)   dexamethasone (DECADRON) injection 10 mg (10 mg Intravenous Given 03/09/23 0719)   famotidine (PEPCID) injection 20 mg (20 mg Intravenous Given 03/09/23 0719)   LORazepam (ATIVAN) injection 0.5 mg (0.5 mg Intravenous Given 03/09/23 0735)       Imaging Results & Interpretation:  No orders to display    My EKG Interpretation: Sinus at 87.  Left axis.  No ST elevations or depressions    ED Course, Medical Decision-making, Disposition:  67 year old female patient presents for left tongue angioedema that started around 5 AM.  Never had it previously.  Suspect ACE inhibitor induced however will provide Benadryl, Decadron, Pepcid as a cautionary measure    ICU hospitalist and respiratory therapy notified of patient's presence    After medications, no significant change.  She  states that her symptoms feel to be appropriately stabilized, not getting worse but not getting better yet.  Anxious, given Ativan    Seen by both respiratory as well as ICU hospitalist, Dr. Lydia Guiles    Will admit for observation given critical airway.    Total critical care time is 35 minutes outside of separately billable procedures. Time included the complexity of the case; directly caring for the patient; interpreting labwork, EKG; discussing with admitting physician and documenting the chart.      Disposition:  Admit To [facility]    Condition:  Stable    Diagnosis/Diagnoses:  Angioedema, initial encounter    Marny Lowenstein, MD  03/09/2023  Dept of Emergency Medicine  [facility]    This Emergency Department patient encounter note was created using voice-recognition software and in real time during the ED visit. Please excuse any typographical errors that have not yet been reviewed and corrected.
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
## 2023-03-09 NOTE — Plan of Care (Signed)
Pt arrived to the ICU around 0815, pt is a/o x3, calm and cooperative with staff. ST on cardiac monitor, remains on 2L NC satting in the high 90s, denies difficulty breathing but says its hard to swallow. L side of tongue and neck are swollen but per pt is improving. Complains of back and leg pain, X1 dilaudid given with good effect.  X1 assist to bathroom and tolerating well, all needs met.    1330- nursing swallow eval done at bedside per MD, pt able to tolerate water but is having difficulty with applesauce at this time.    1630- swelling decreased more, able to tolerate crackers. Pt wanting to be d/c.    1745- MD at bedside to eval, pt wanting to leave, D/C paperwork placed, Ivs removed, AVS gone over with pt, all questions addressed, pt left with belongings.    Problem: Safety  Goal: Free from accidental physical injury  Outcome: Progressing     Problem: Daily Care  Goal: Daily care needs are met  Description: Assess and monitor ability to perform self care and identify potential discharge needs.  Outcome: Progressing     Problem: Pain  Goal: Patient's pain/discomfort is manageable  Description: Assess and monitor patient's pain using appropriate pain scale. Collaborate with interdisciplinary team and initiate plan and interventions as ordered. Re-assess patient's pain level 30 - 60 minutes after pain management intervention.   Outcome: Progressing     Problem: Hemodynamic Status  Goal: Patient has stable vital signs and fluid balance  Description: Assess and monitor patient's heart rate, rhythm, respiratory rate, peripheral pulses, capillary refill, color, body temperature, intake and output, labs and physical activity tolerance.   Observe for signs of chest pain (note location, duration, severity, radiation and associated symptoms such as diaphoresis, nausea, indigestion).  Monitor for signs and symptoms of heart failure (eg. shortness of breath, edema of feet/ankles/legs, rapid irregular heart rate,  coughing, wheezing, white/pink blood tinged sputum, sudden weight gain, chest pain). Collaborate with interdisciplinary team and initiate plan and interventions as ordered.  Outcome: Progressing     Problem: Inadequate Airway Clearance  Goal: Patient will maintain patent airway  Description: Assess and monitor breath sounds, cough and sputum (if present), and intake/output. Collaborate with respiratory therapy to administer medications and treatments.   Outcome: Progressing     Problem: Inadequate Gas Exchange  Goal: Patient is adequately oxygenated and ventilation is improved  Description: Assess and monitor vital signs, oxygen saturation, respiratory status to include rate, depth, effort, and lung sounds, mental status, cyanosis, and labs (ABG's).  Monitor effects of medications that may sedate the patient.  Collaborate with respiratory therapy to administer medications and treatments.  Outcome: Progressing     Problem: Activity:  Goal: Mobility will be supported throughout the hospitalization,  Outcome: Progressing     Problem: Cognitive:  Goal: Caregivers and patients knowledge of risk factors and measures for prevention of condition will be supported throughout the hospitalization  Outcome: Progressing     Problem: Safety:  Goal: Will remain free from falls throughout the hospitalization,  Outcome: Progressing     Problem: Fall risk identification/communication (REQUIRED)  Description: REQUIRED if Fall Risk score is 5 or greater  Goal: Patients at risk for falling will be known to staff  Description: Fall risk assessment to be done:       Upon Admission       Transfer from one level of care to another       Whenever there is a  change in patient status       After every fall event   Outcome: Progressing     Problem: Medications in Use  Goal: Reduce fall/injury risk related to medication side effects  Outcome: Progressing

## 2023-03-09 NOTE — Event Note (Signed)
The patient reports that her swelling is now resolved and she wishes to be discharged.  He was strongly recommended the patient stay in the hospital to complete a full 24 hours of monitoring given the severity of angioedema.  Risks and benefits were discussed at length with the patient and her son and the patient expressed understanding of the risks of leaving the hospital prematurely and the benefits of staying overnight for observation.  She decided to accept the risks of leaving the hospital and go home.  Capacity to make medical decisions was never questioned.  The patient was prescribed 3 more days of oral prednisone to her local pharmacy and she chose to direct her own discharge.    Hector Shade, MD

## 2023-03-09 NOTE — Discharge Summary (Addendum)
Physician Discharge Summary     Patient ID:  Name Samantha Cameron  MRN [id number]   Age 67 year old  DOB [birth year]    Admit Date: 03/09/23    Discharge Date: 03/09/23  Admitting Physician: Hector Shade, MD  Discharge Physicians    Attending: Hector Shade, MD     Discharge Diagnoses:   Angioedema of the tongue  Reaction to ACE inhibitor    Hospital Summary:  Samantha Cameron is a 67 year old female with h/o COPD not on home O2, T2DM, HLD, chronic pain on opioids, SVT, panic disorder and obesity who is admitted to the ICU with angioedema of the tongue.  She was initially treated in the ED with IM epinephrine, Decadron and Pepcid and then admitted to the ICU for close observation.  Over the course of the following 10 hours her swelling gradually improved and she was able to tolerate a full diet without any respiratory problems.  It was recommended the patient that she stay in the ICU for observation for full 24 hours but she declined to stay and directed her on discharge.    Issues for Follow up:    The patient should never take ACE inhibitors again due to the increased risk of recurrent angioedema.   The patient should complete 3 additional days of prednisone out of abundance of precaution.    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   1. GERD - severe 2.                Unintentional weight loss 3. Abdominal pain     Plan: 1.                Recommend EGD. This procedure has been fully reviewed                with the patient and  written informed consent has been                obtained. 2.   No date: Esophageal reflux  No date: External hemorrhoid      Comment:  per steward records  04/19/2017: Gastritis without bleeding      Comment:  GI endoscopy: 06/2018: Mildly severe reflux esophagitis.               Biopsied.      - Chronic gastritis. Biopsied.      - No                gross lesions in the entire examined duodenum.                Complications:  No immediate complications.                Recommendation:      - Await pathology results. 7/2019GI:               Assessment: Pt is 65F with PMH COPD, morbid obesity,                H.pylori (2009, unclear if treated) who p/w severe GERD                symptoms, unintentional weigh  05/10/2018: Gastroesophageal reflux disease without esophagitis  No date: HTN (hypertension)  02/01/2012: Hypoxia  02/16/2017: Left ear pain  06/07/2015: Left genital labial abscess  06/16/2015: Obesity, Class III, 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y) (HCC)  08/16/2015: Osteopenia      Comment:  On xray of left knee per steward records 06/2014 Unclear                if addressed  05/23/2018: Pancreatic lesion  09/14/2015: Right ear impacted cerumen  No date: Sleep apnea  08/15/2015: Type 2 diabetes mellitus without complication, without   long-term current use of insulin (HCC)      Comment:   HEMOGLOBIN  A1C (%) Date Value 07/15/2015 6.2 (H)                ----------  Steward record - a1c 6.2 on 01/31/15 6.4 on                09/30/14 5.9 on 12/29/13 6.4 08/18/13   11/18/2017: Viral URI with cough    Summary of the Presenting Complaint:   Samantha Cameron is a 67 year old female with h/o COPD not on home O2, T2DM, HLD, chronic pain on opioids, SVT, panic disorder and obesity who is admitted to the ICU with angioedema of the tongue.      The patient reports that for the last 3 days she has had an upset stomach and intermittent nausea.  She is also had a dry cough and some sinus congestion.  Her appetite has been normal and otherwise has not felt unwell.  At approximately 6 AM the morning of presentation she woke with trouble breathing and noticed that her tongue particularly on the left side was swollen.  She reports that she generally felt well when she went to bed the night prior after taking all of her scheduled nighttime medicines.  She initially felt some difficulty swallowing.  She denies fevers, chills, lightheadedness or dizziness.     In the ED, initial vitals were T97.27F, HR 82, RR 24, BP 119/78, SpO2 93% on RA. She was given lorazepam 0.5 mg IV, famotidine 20 mg IV, dexamethasone 10 mg IV, and epinephrine  0.3mg  IM.     Hospital Course (by problem):  Angioedema of the tongue:   The patient presented with acute onset swelling of her tongue, dysphagia and shortness of breath found to have signs of angioedema of the tongue. No signs of lip or other face swelling although she does have prominence of her submandibular region on the left side.  She was treated in the ED with IM epinephrine steroids and H2 blocker and began show improvement. She was observed closely in the ICU for 10 hours and swelling of the tongue greatly improved. She was able to tolerate fluids and solid foods without any issue. She never required oxygen in the ICU while awake.  She takes an ACE inhibitor for renal protection for type 2 diabetes,  which is likely the culprit of her angioedema. She was recommended to stay for full 24 hours of monitoring but she declined to do so.  She was discharged home with 3-day course of oral prednisone and strongly advised not to take lisinopril ever again.  She should discuss alternative treatment options with her PCP     COPD  No baseline oxygen requirement and does not appear to be in a COPD flare this admission.  Continues to smoke and cessation counseling was offered.  Home albuterol was ordered while she was in the hospital but never needed.     T2DM  The patient's last A1c was 6.5 with just metformin.  Given how well-controlled her diabetes is sugars were not closely monitored and insulin was not used.  She was encouraged to restart metformin upon discharge     HLD: Home atorvastatin was ordered for nightly but was never taken due to early discharge     Chronic pain:   The patient reliably fills Percocet twice daily every month.  Initially she was treated with IV Dilaudid for pain control as she was not able to take p.o.  Prior to discharge she was able to take full diet.  She was recommended to resume her home Percocet upon discharge.      History of PAC and SVT:   Heart rate mildly elevated to low 100s, possibly due to IM epinephrine and anxiety in the setting of angioedema.  She was kept on telemetry and heart rate improved on its own.  She was encouraged to resume her home metoprolol after discharge.    Key labs, imaging, other tests:   BMP:   Recent Labs     03/09/23  0717   NA 138   K 4.2   CL 104   CO2 23   BUN 16   CREAT 0.6   GLUCOSER 98     Ca, Mg, Phos:   Recent Labs     03/09/23  0717   CA 9.3     CBC:   Recent Labs     03/09/23  0717   WBC 10.2   HGB 13.4   HCT 40.9   PLTA 252   RBC 4.53     Coagulation Labs: No results for input(s): "PT", "APTT", "INR" in the last 72 hours.  LFTs: No results for input(s): "AST", "ALT", "TBILI", "DBILI", "ALKPHOS", "ALBUMIN" in the last 72 hours.  Pancreatic Enzymes:  No results for input(s): "AMY", "LIP" in the last 72 hours.  Urinalysis:No results for input(s): "UACOL", "UACLA", "UAGLU", "UABIL", "UAKET", "SPEGRAVURINE", "UAOCC", "UAPH", "UAPRO", "UANIT", "LEUKOCYTES" in the last 72 hours.    Invalid input(s): "UAOBU"  Troponin: No results for input(s): "TROPI" in the last  72 hours.  NT-proBNP: No results for input(s): "PROBNP" in the last 72 hours.  Lactic acid: No results for input(s): "LACTICACID" in the last 72 hours.    Pending Laboratory Work and Tests:   none    Discharge Exam:  BP 105/72   Pulse 88   Temp 97 F (36.1 C) (Temporal)   Resp 13   Wt 98.8 kg (217 lb 13 oz)   LMP 10/22/2007 (LMP Unknown)   SpO2 95%   BMI 42.54 kg/m   Gen: AOx3 NAD Appears stated age   HEENT: PERRL, EOMI, no scleral icterus or conjunctival injection. MMM. Very mild swelling of the left side of the tongue much improved from admission. Uvula visible.   Neck: no LAD or thyromegaly. Left submandibular region greater than right.   CV: RRR. Normal S1 S2. No S3 or S4. No m/r/g appreciated.   Pul: CTAB. No crackles or wheezes  Abdm: Nontender nondistended normoactive BS  Extr: No edema. Normal ROM on all extremities.  Neuro: CN II-XII grossly intact.  Skin: No rash, lesions, ulcerations, induration or venous pressure ulcers.     Discharge Medication List:  Discharge Medication List as of 03/09/2023  5:27 PM    START taking these medications    predniSONE (DELTASONE) 20 MG tablet  Take 2 tablets by mouth in the morning for 3 days.  E-Prescribe, Disp-6 tablet, R-0      CONTINUE these medications which have NOT CHANGED    fluticasone (FLONASE) 50 MCG/ACT nasal spray  1 spray by Each Nostril route in the morning.  Historical    cyclobenzaprine (FLEXERIL) 5 MG tablet  Take 5 mg by mouth 3 (three) times daily as needed  Historical    B Complex-C-Folic Acid (B-COMPLEX/VITAMIN C) TABS  Take 1 each by mouth in the morning.  E-Prescribe, Disp-90 tablet, R-0    polyethylene glycol (GOLYTELY) 236 g  suspension  Take 4,000 mLs by mouth See Admin Instructions Take as directed prior to your colonoscopy  E-Prescribe, Disp-4000 mL, R-0    Simethicone 80 MG TABS  Take 4 tablets by mouth See Admin Instructions Take as directed prior to colonoscopy.  E-Prescribe, Disp-4 tablet, R-0    albuterol HFA 108 (90 Base) MCG/ACT inhaler  Inhale 2 puffs into the lungs every 6 (six) hours as needed for Wheezing  E-Prescribe, Disp-1 each, R-1  OK to switch to covered brand alternative      atorvastatin (LIPITOR) 20 MG tablet  take 1 tablet by mouth once daily  E-Prescribe, Disp-90 tablet, R-3    metFORMIN (GLUCOPHAGE-XR) 500 MG 24 hr tablet  Take 2 tablets by mouth daily with breakfast  E-Prescribe, Disp-180 tablet, R-3    metoprolol (TOPROL-XL) 25 MG 24 hr tablet  Take 1 tablet by mouth in the morning.  E-Prescribe, Disp-90 tablet, R-3    oxycodone-acetaminophen (PERCOCET) 5-325 MG per tablet  Take 1 tablet by mouth 2 (two) times daily as needed With Dr Lucianne Muss  Historical    aspirin 81 MG tablet  Take 81 mg by mouth daily.  Historical, Oral, DAILY    glucose blood test strip  Use once daily as directed. Dispense test strips covered by insurance - using freestyle lite. Dx e11.29  Disp-100 strip, R-11, E-Prescribe  If patient has Medicare, please list ICD code in Patient Sig.      Lancets  Use 1 time daily.  Dispense lancets that are covered by insurance. ICD Code e11.29  Disp-100 each, R-11, E-Prescribe  If patient has  Medicare, please list ICD code in Patient Sig.      omeprazole (PRILOSEC) 20 MG capsule  Take 1 capsule by mouth in the morning.  E-Prescribe, Disp-90 capsule, R-3  Ok to dispense capsules or tablets.      carboxymethylcellulose (REFRESH TEARS) 0.5 % SOLN  Place 1 drop into both eyes in the morning and 1 drop at noon and 1 drop in the evening and 1 drop before bedtime.  E-Prescribe, Disp-30 mL, R-10    albuterol (PROVENTIL) (2.5 MG/3ML) 0.083% nebulizer solution  Take 3 mLs by nebulization every 6 (six) hours as  needed for Wheezing Dx: Code: J44.9  E-Prescribe, Disp-75 mL, R-1    cholecalciferol (VITAMIN D3) 1000 UNIT tablet  Take 1 tablet by mouth daily  Historical      STOP taking these medications    lisinopril (ZESTRIL) 5 MG tablet    pregabalin (LYRICA) 100 MG capsule        Code Status at Admission: full  Code Status at Discharge: Full Code  Healthcare Proxy and Phone Number: Vivi Barrack  [phone]   Diet: regular   Wound Care: none needed   Discharge Destination: Home    Future appointments:   Current and Future Appointments at [facility] (90 Days) 03/09/2023 - 06/07/2023        Date Visit Type Length Department Provider     03/11/2023  8:30 AM PRE-ADMISSION SCREENING CALL 30 min [facility] - Pretesting [phone] [facility] RN2    Patient Instructions:     On this date, you will receive a phone call from a [facility] nurse to discuss your upcoming procedure. You do not need to come to the hospital for this appointment                       Follow-up Information    None       To reach medical records at any time call the [facility] operator at [phone]     To reach the covering hospitalist, page [phone]  To reach the [facility] for pending results, call [phone]    On initial presentation, there was a reasonable expectation that the patient would require an inpatient stay of greater than two midnights. However, the patient recovered quickly. This unforeseen recovery resulted in discharge prior to two midnights.    On day of discharge, I spent 30 minutes face-to-face with this patient and unit time, of which greater than 50% of that time was spent counseling/coordinating care including reviewing the chart, meeting with the treatment team, meeting with family/caretakers, patient/family education and discussing diagnosis, prognosis, instructions for management, follow up, and risk factor reduction.     Signed:  Hector Shade, MD, 03/09/2023

## 2023-03-09 NOTE — ED Triage Note (Signed)
Pt presents by [facility] from home, pt reports she woke up at 6am, feeling SOB, tongue swelling more on L side, as well as lips and L side of neck. Pt reports she sounds normal but does appear to have muffled voice. Reports difficulty swallowing, no pain. Pt does take lisinopril last dose 9am yesterday.   IV access obtained, MD to bedside medicated per orders.  Spo2 92-94% RA

## 2023-03-09 NOTE — Progress Notes (Signed)
Informed by Dr.Nadler about pt with left sided angioedema.  Pt in no distress.  Appears comfortable.  Will continue to monitor.

## 2023-03-10 NOTE — Care Coordination (Signed)
Chart review only:    Patient was admitted and discharged prior to CM assessment . Per chart review patient was discharged home with outpatient follow up.

## 2023-03-11 ENCOUNTER — Telehealth (HOSPITAL_BASED_OUTPATIENT_CLINIC_OR_DEPARTMENT_OTHER): Payer: Self-pay

## 2023-03-11 ENCOUNTER — Ambulatory Visit
Admission: RE | Admit: 2023-03-11 | Discharge: 2023-03-11 | Disposition: A | Discharge status: Home / Self Care | Payer: Medicare Other

## 2023-03-11 ENCOUNTER — Encounter (HOSPITAL_BASED_OUTPATIENT_CLINIC_OR_DEPARTMENT_OTHER): Payer: Self-pay

## 2023-03-11 ENCOUNTER — Telehealth: Payer: Self-pay

## 2023-03-11 HISTORY — DX: Gastro-esophageal reflux disease without esophagitis: K21.9

## 2023-03-11 HISTORY — DX: Chronic obstructive pulmonary disease with (acute) exacerbation: J44.1

## 2023-03-11 HISTORY — DX: Presence of spectacles and contact lenses: Z97.3

## 2023-03-11 NOTE — Telephone Encounter (Signed)
Tobacco Cessation/Education Referral Outreach  Call made to [facility], regarding Tobacco Cessation/Education Referral. Interpreter not needed.   Patient did not answer,  team member left a voice message to check-in and to provide support/resources. Call back number was provided and team member asked patient to leave a message with best day/time to return call.  I have completed the following communication and reminder steps: Patient notified by telephone  Narda Rutherford  [facility] Educator II  Tobacco Cessation Education Program  [phone]    On Behalf of Dr.Simons, Earl Lites., MD

## 2023-03-11 NOTE — Progress Notes (Addendum)
Call placed to [facility] at ext [phone] (anesthesia dept). She states Dr. Jeraldine Loots to f/u with patient.     Received call from [facility] from PAT for [facility]. She states she does not call patients prior to colonoscopy.  Msg sent to [facility] as order for colonoscopy requests a pre-procedure PAT.  Awaiting further advice.

## 2023-03-11 NOTE — Progress Notes (Signed)
Patient referred to [facility] program for post discharge 30 day follow-up.    Admitted to: [facility]    Reason for admission: Angioedema of the tongue  Reaction to ACE inhibitor    Initial contact with patient: [facility] enrollment - no hospital visit:     [facility] staff was unable to meet with patient at hospital and will contact patient via phone post discharge.      If pt consents she will receive support from the Hospital to [facility] for 30 days after discharge.  [facility] will provide medical appointment reminders and assist with transportation, medical issues, community  Referrals and other logistical problems.    Assigned [facility] CHW staff: [place]    CM called and Pt reported she is doing fine. Pt denied need for any help or support at home.    CM was able to review discharge instructions with Pt:  You came to the hospital for: Swelling of the tongue and difficulty breathing.  During your hospital stay:  You were diagnosed with angioedema likely from your blood pressure medicine, lisinopril. In the emergency department  you were given treatment for this and then admitted to the ICU for observation. During your time in the ICU the swelling  in your tongue resolved and you are able to eat food and drink water without any problem.  PATIENT INSTRUCTIONS  As you transition home, please:  You should take oral prednisone for an additional 3 days to ensure resolution of the swelling.  You should not take lisinopril or other ACE inhibitor's ever again.  You may resume your regular diet and activity level that you have prior to coming to the hospital.  Please see your PCP within one week to discuss: Any remaining symptoms from this hospitalization and any  replacement medications for lisinopril, which you should not take over again.  Reasons to call or visit your PCP (or an Emergency Room if PCP unavailable):  --Medication refills  --Severe symptoms of any kind. FOR EMERGENCIES CALL [phone]  It was a  pleasure to care for you in the hospital.  Hector Shade, MD, and your [facility] Team  03/09/23    Medication - CM was able to review with Pt the med changes. Pt reported having only 2 tabs left of prednisone.  START taking:  predniSONE (DELTASONE)  Start taking on: March 10, 2023  STOP taking:  lisinopril 5 MG tablet (ZESTRIL)  pregabalin 100 MG capsule (LYRICA)    CM sent a message to PCP and rN Pool for advise about medication and upcoming endoscopy.    Date of next planned [facility] contact: 03/18/23         Arnell Asal, CCP, CHW  CHW Care Manager  [phone] cell

## 2023-03-12 ENCOUNTER — Telehealth (HOSPITAL_BASED_OUTPATIENT_CLINIC_OR_DEPARTMENT_OTHER): Payer: Self-pay

## 2023-03-12 ENCOUNTER — Other Ambulatory Visit (HOSPITAL_BASED_OUTPATIENT_CLINIC_OR_DEPARTMENT_OTHER): Payer: Self-pay | Admitting: Internal Medicine

## 2023-03-12 ENCOUNTER — Encounter (HOSPITAL_BASED_OUTPATIENT_CLINIC_OR_DEPARTMENT_OTHER): Payer: Self-pay

## 2023-03-12 ENCOUNTER — Other Ambulatory Visit: Payer: Self-pay

## 2023-03-12 DIAGNOSIS — R002 Palpitations: Secondary | ICD-10-CM

## 2023-03-12 DIAGNOSIS — E1129 Type 2 diabetes mellitus with other diabetic kidney complication: Secondary | ICD-10-CM

## 2023-03-12 DIAGNOSIS — E66813 Obesity, class 3: Secondary | ICD-10-CM

## 2023-03-12 MED ORDER — OMEPRAZOLE 20 MG PO CPDR
20.0000 mg | DELAYED_RELEASE_CAPSULE | Freq: Every day | ORAL | 3 refills | Status: DC
Start: 2023-03-12 — End: 2023-12-31

## 2023-03-12 MED ORDER — GLUCOSE BLOOD VI STRP
ORAL_STRIP | 11 refills | Status: DC
Start: 2023-03-12 — End: 2023-11-28

## 2023-03-12 MED ORDER — METFORMIN HCL ER 500 MG PO TB24
1000.0000 mg | ORAL_TABLET | Freq: Every day | ORAL | 3 refills | Status: DC
Start: 2023-03-12 — End: 2023-12-31

## 2023-03-12 MED ORDER — METOPROLOL SUCCINATE ER 25 MG PO TB24
25.0000 mg | ORAL_TABLET | Freq: Every day | ORAL | 3 refills | Status: DC
Start: 2023-03-12 — End: 2023-12-31

## 2023-03-12 MED ORDER — FLUTICASONE PROPIONATE 50 MCG/ACT NA SUSP
1.00 | Freq: Every day | NASAL | 11 refills | Status: AC
Start: 2023-03-12 — End: 2024-03-11

## 2023-03-12 MED ORDER — ALBUTEROL SULFATE HFA 108 (90 BASE) MCG/ACT IN AERS
2.0000 | INHALATION_SPRAY | Freq: Four times a day (QID) | RESPIRATORY_TRACT | 1 refills | Status: DC | PRN
Start: 2023-03-12 — End: 2023-10-01

## 2023-03-12 MED ORDER — LANCETS
11 refills | Status: DC
Start: 2023-03-12 — End: 2023-11-28

## 2023-03-12 MED ORDER — CHOLECALCIFEROL 25 MCG (1000 UT) PO TABS
1000.0000 [IU] | ORAL_TABLET | Freq: Every day | ORAL | 11 refills | Status: DC
Start: 2023-03-12 — End: 2024-03-31

## 2023-03-12 MED ORDER — ATORVASTATIN CALCIUM 20 MG PO TABS
20.0000 mg | ORAL_TABLET | Freq: Every evening | ORAL | 3 refills | Status: DC
Start: 2023-03-12 — End: 2023-06-10

## 2023-03-12 MED ORDER — ASPIRIN 81 MG PO TBEC
81.0000 mg | DELAYED_RELEASE_TABLET | Freq: Every day | ORAL | 11 refills | Status: DC
Start: 2023-03-12 — End: 2024-05-08

## 2023-03-12 NOTE — Discharge Instructions (Addendum)
SURGICAL DAY CARE PRE-OPERATIVE INSTRUCTIONS    DAY OF SURGERY    Arrive at: [facility] Registration on Friday, May  3 at (Time): 12:00 noon. The office will call the day before to confirm time of arrival    You must have a responsible adult available to accompany you home after surgery. (We suggest that you have someone available to assist you at home after your surgery).    INSTRUCTIONS:     You may have nothing to eat or drink after midnight the night before your surgery, not even water, gum or hard candy.     You may not smoke the morning of your surgery.    Please leave all valuables at home, including jewelry, watches, money, etc.    Please remove all fingernail polish before arriving at Surgical Day Care and do not  wear any face or lip make-up.    If you are having eye surgery, do not wear any eye or face makeup and avoid facial moisturizers and perfumes.    Please take out any hair extensions that can be removed.    Do not shave surgical site.    Do not wear contact lenses.    MEDICATIONS: Week before surgery do not take any ibuprofen,Advil,aleve or naproxen                                 Stop aspirin 5 days before procedure  LAST DOSE APRIL 27        Take the following medication the night before surgery at bedtime: All inhalers Percocet atorvastatin    Take the following medication the morning of your surgery with a sip of water all inhalers, metoprolol, Prilosec Flonase  percocet   ( if needed)                                                                                              DO NOT TAKE METFORMIN MORNING OF PROCEDURE                                                                                                          MAY TAKE AFTER

## 2023-03-12 NOTE — Telephone Encounter (Signed)
Call returned, caller was [facility], relayed message per below.   No further questions and or concerns.     Noralee Chars, PA-C  [employer] now (3:14 PM)     NL  Hi Shruti Arrey--she can stop the aspirin for 5 days just in case they need to biopsy anything large.

## 2023-03-12 NOTE — Telephone Encounter (Signed)
Aspirin is not usually an issue to continue during colonoscopy even with polyp removal- unless the patient requires resection of a larger polyp >1-2cm.      Since she is on aspirin for primary prevention, she could stop it for 5 days before the procedure but does not have to.  What did she do for her other colonoscopies?      If she decides to continue and she has a larger than expected polyp removal (she may need to ask the doctor/team about this on the day of the procedure once out from sedation) - she could at that time think about stopping her aspirin for 7 days (as opposed to starting again the next day)

## 2023-03-12 NOTE — Telephone Encounter (Addendum)
Message to PCP for review   Please advise     ----- Message from Burnett Harry sent at 03/12/2023 10:09 AM EDT -----  Regarding: [facility]  JOYLENE WESCOTT [phone], 67 year old, female    Calls today: Having colonoscopy next week and wants to know if she can stop taking her aspirin.    Person calling on behalf of patient: Melina Schools NUMBER: [phone]    Patient's PCP: Jamey Reas., MD    Primary Care Home Site:  [facility]

## 2023-03-12 NOTE — Telephone Encounter (Signed)
[facility], GENESI STEFANKO is a 67 year old female has requested a refill of      -  Fluticasone ns, Albuterol HFA, diabetic test strips and lancets, Metformin, Metoprolol, Atorvastatin Omeprazole, Vit D, ASA      Please Review: Patient has transferred pharmacies and will be filling prescriptions at [facility] going forward. [facility] [place] pharmacy will need new prescriptions as their previous pharmacy has closed out all active prescriptions on file. I have pended new prescriptions to you for your approval.      Last Office Visit: 03/01/2023 with Alicia Amel  Last Physical Exam: 08/15/2015     DM EYE EXAM due on 06/08/2023     HTN Med:    Most Recent BP Reading(s)  03/09/23 : 105/72  03/01/23 : 118/74  09/20/22 : 120/80    Statin Med:  Lipids   Cholesterol (mg/dL)   Date Value   13/06/6577 166     LOW DENSITY LIPOPROTEIN DIRECT (mg/dL)   Date Value   46/96/2952 83     HIGH DENSITY LIPOPROTEIN (mg/dL)   Date Value   84/13/2440 47     TRIGLYCERIDES (mg/dL)   Date Value   09/15/2535 268 (H)     LFTs   ALANINE AMINOTRANSFERASE (U/L)   Date Value   07/30/2018 32       ASPARTATE AMINOTRANSFERASE (U/L)   Date Value   07/30/2018 21       ALBUMIN (g/dL)   Date Value   64/40/3474 3.7       TOTAL PROTEIN (g/dL)   Date Value   25/95/6387 6.9       BILIRUBIN DIRECT (mg/dl)   Date Value   56/43/3295 0.1       BILIRUBIN TOTAL (mg/dL)   Date Value   18/84/1660 0.2       ALKALINE PHOSPHATASE (U/L)   Date Value   07/30/2018 92       Diabetic Med:   HEMOGLOBIN A1C (%)   Date Value   03/01/2023 6.5 (H)       No results found for: "POCA1C"   CREATININE (mg/dL)   Date Value   63/11/6008 0.6     Other Med Adult:  Most Recent BP Reading(s)  03/09/23 : 105/72        Cholesterol (mg/dL)   Date Value   93/23/5573 166     LOW DENSITY LIPOPROTEIN DIRECT (mg/dL)   Date Value   22/12/5425 83     HIGH DENSITY LIPOPROTEIN (mg/dL)   Date Value   05/12/7627 47     TRIGLYCERIDES (mg/dL)   Date Value   31/51/7616 268 (H)         THYROID SCREEN  TSH REFLEX FT4 (uIU/mL)   Date Value   05/26/2020 2.040         No results found for: "TSH"    HEMOGLOBIN A1C (%)   Date Value   03/01/2023 6.5 (H)       No results found for: "POCA1C"      INR (no units)   Date Value   04/18/2017 1.0   05/27/2016 1.0   02/01/2012 < 1.0 (L)       SODIUM (mmol/L)   Date Value   03/09/2023 138       POTASSIUM (mmol/L)   Date Value   03/09/2023 4.2           CREATININE (mg/dL)   Date Value   07/37/1062 0.6  Documented patient preferred pharmacies:    [facility] - [place], [place]  Phone: [phone] Fax: [phone]    [id number] - [place], [place] - [address] AT [facility]  Phone: [phone] Fax: [phone]    [id number] - [place], [place] - [address] AT [facility] OF [address]  Phone: [phone] Fax: [phone]

## 2023-03-12 NOTE — Progress Notes (Signed)
Received call from Dr Darrol Angel office, aspirin to be held for 5 days prior to procedure, Patient notified, verbalized understanding

## 2023-03-12 NOTE — Surgery Pre-Op (Addendum)
PAT Visit          Samantha Cameron is a 67 year old female received a preoperative screening.        Appointments for Next 30 Days 03/12/2023 - 04/11/2023        Date Visit Type Department Provider     03/27/2023 10:20 AM HOSPITAL FOLLOW-UP [facility] Jamey Reas., MD    Patient Instructions:                       Surgery Information       Future Procedures (Tomorrow to 03/11/2024)         Date Time Visit Type/Procedure Providers Loc / Dept Status        03/22/2023 1330 COLONOSCOPY - N/A Joretta Bachelor. [facility]      Visit Type/Procedure: COLONOSCOPY - N/A                                    .     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   1.  GERD - severe 2.                Unintentional weight loss 3. Abdominal pain     Plan: 1.                Recommend EGD. This procedure has been fully reviewed                with the patient and written informed consent has been  obtained. 2.   No date: Esophageal reflux  No date: External hemorrhoid      Comment:  per steward records  04/19/2017: Gastritis without bleeding      Comment:  GI endoscopy: 06/2018: Mildly severe reflux esophagitis.               Biopsied.      - Chronic gastritis. Biopsied.      - No                gross lesions in the entire examined duodenum.                Complications:  No immediate complications.                Recommendation:      - Await pathology results. 7/2019GI:               Assessment: Pt is 21F with PMH COPD, morbid obesity,                H.pylori (2009, unclear if treated) who p/w severe GERD                symptoms, unintentional weigh  05/10/2018: Gastroesophageal reflux disease without esophagitis  No date: HTN (hypertension)  02/01/2012: Hypoxia  02/16/2017: Left ear pain  06/07/2015: Left genital labial abscess  06/16/2015: Obesity, Class III, 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y) (HCC)  No date: Obstructive chronic bronchitis with exacerbation (HCC)  08/16/2015: Osteopenia      Comment:  On xray of left knee per steward records 06/2014 Unclear                if addressed  05/23/2018: Pancreatic lesion  09/14/2015: Right ear impacted cerumen  No date: Sleep apnea  08/15/2015: Type 2 diabetes mellitus without complication, without   long-term current use of insulin (HCC)      Comment:   HEMOGLOBIN A1C (%) Date Value 07/15/2015 6.2 (H)                ----------  Steward record - a1c 6.2 on 01/31/15 6.4 on                09/30/14 5.9 on 12/29/13 6.4 08/18/13   11/18/2017: Viral URI with cough  No date: Wears eyeglasses     has a past surgical history that includes LAPS SURG CHOLECSTC; ANES NERVE MUSC TENDON FSCA&BRS KNEE&/POP AREA; TONSILLECTOMY&ADENOIDECTOMY UNDER  AGE 67; TOTAL KNEE REPLACEMENT (04/13/2009); OB ANTEPARTUM CARE C DLVR&POSTPARTUM; Foot surgery; Wrist ganglion excision (08/11/2009); CHOLECSTC; and Cataract extraction, extracapsular w/ intraocular lens implant.         Alcohol, Tobacco and Drug History:  Social History    Tobacco Use      Smoking status: Every Day        Packs/day: 0.50        Years: 31.00        Additional pack years: 0.00        Total pack years: 15.50        Types: Cigarettes        Last attempt to quit: 03/10/2018        Years since quitting: 5.0      Smokeless tobacco: Never      Tobacco comments: quit smoking inform provided to patient    E-Cigarette/Vaping  E-Cigarette Use: Never User  Quit Date:   Nicotine:   Start Date:   THC:    Cartridges/Day:   CBD:   Other  Substance:   Flavoring:       Alcohol use   No       Drug use:   No       Extended Emergency Contact Information  Primary Emergency Contact: Cassio,Gina  Address: [address]           [place], [place] [address]  Home Phone: [phone]  Relation: Daughter  Secondary Emergency Contact: Ocasio,Erik  Address: [address]           [place], [place] [address]  Mobile Phone: [phone]  Relation: Son        Current Outpatient Medications   Medication Sig    fluticasone (FLONASE) 50 MCG/ACT nasal spray 1 spray by Each Nostril route in the morning.    cyclobenzaprine (FLEXERIL) 5 MG tablet Take 5 mg by mouth 3 (three) times daily as needed    predniSONE (DELTASONE) 20 MG tablet Take 2 tablets by mouth in the morning for 3 days.    B Complex-C-Folic Acid (B-COMPLEX/VITAMIN C) TABS Take 1 each by mouth in the morning.    polyethylene glycol (GOLYTELY) 236 g suspension Take 4,000 mLs by mouth See Admin Instructions Take as directed prior to your colonoscopy    Simethicone 80 MG TABS Take 4 tablets by mouth See Admin Instructions Take as directed prior to colonoscopy.    albuterol HFA 108 (90 Base) MCG/ACT inhaler Inhale 2 puffs into the lungs every 6 (six) hours as needed for Wheezing    glucose  blood test strip Use once daily as directed. Dispense test strips covered by insurance - using freestyle lite. Dx e11.29    Lancets Use 1 time daily.  Dispense lancets that are covered by insurance. ICD Code e11.29    atorvastatin (LIPITOR) 20 MG tablet take 1 tablet by mouth once daily (Patient taking differently: nightly)    metFORMIN (GLUCOPHAGE-XR) 500 MG 24 hr tablet Take 2 tablets by mouth daily with breakfast    metoprolol (TOPROL-XL) 25 MG 24 hr tablet Take 1 tablet by mouth in the morning.    omeprazole (PRILOSEC) 20 MG capsule Take 1 capsule by mouth in the morning.    carboxymethylcellulose (REFRESH TEARS) 0.5 % SOLN Place 1 drop into both eyes in the morning and 1 drop at noon and 1 drop in the evening and 1 drop before bedtime.    albuterol (PROVENTIL) (2.5 MG/3ML) 0.083% nebulizer solution Take 3 mLs by nebulization every 6 (six) hours as needed for Wheezing Dx: Code: J44.9    cholecalciferol (VITAMIN D3) 1000 UNIT tablet Take 1 tablet by mouth daily    oxycodone-acetaminophen (PERCOCET) 5-325 MG per tablet Take 1 tablet by mouth 2 (two) times daily as needed With Dr Lucianne Muss    aspirin 81 MG tablet Take 81 mg by mouth daily.     No current facility-administered medications for this encounter.         (Not in a hospital admission)      PAT Assessment             Activity tolerance (How many flights of stairs): can't do pains  pain in   back and SOB         Assistive Device: Cane                                  PAT Screening     Row Name 03/12/23 [phone]  Integumentary - Complete full head to toe skin assessment    Integumentary (WDL) WDL    Row Name 03/12/23 0925       STOP-Bang Questionaire    Weight 100.2 kg (221 lb)    Row Name 03/12/23 0924       STOP-Bang Questionaire     Do you snore loudly (loud enough to be heard through closed doors or   your bed-partner elbows you for snoring at night? 1    Do you often feel Tired, fatigued, or Sleepy during the daytime (such as   falling asleep when  driving)? 0    Has anyone observed you stop breathing, or choking/gasping during your   sleep? 1    Do you have, or are being treated for, High Blood Pressure? 1    Height  (1.575 m)                       03/12/23  0924 03/12/23  0925   Weight:  100.2 kg (221 lb)   Height:  (1.575 m)            Phone screen completed, Spoke with Dr Dory Horn anesthesia,reviewed history and medicines,.there are no specific question. Spoke with  Robin PA from GI, patient OSA does not use CPAP machine. Awaiting response from PCP, if able to hold ASA  Medication instructions were given. AVS sent to MyChart  Patient expressed understanding    Milagros Reap, RN

## 2023-03-14 ENCOUNTER — Telehealth: Payer: Self-pay

## 2023-03-14 NOTE — Progress Notes (Signed)
[facility] CHW Called patient for Initial contact post discharge.    Contact Outcome: Left message and Unable to reach    1. H2H activities during call: 3 attempts done today    You came to the hospital for: Swelling of the tongue and difficulty breathing.  During your hospital stay:  You were diagnosed with angioedema likely from your blood pressure medicine, lisinopril. In the emergency department  you were given treatment for this and then admitted to the ICU for observation. During your time in the ICU the swelling  in your tongue resolved and you are able to eat food and drink water without any problem.  PATIENT INSTRUCTIONS  As you transition home, please:  You should take oral prednisone for an additional 3 days to ensure resolution of the swelling.  You should not take lisinopril or other ACE inhibitor's ever again.  You may resume your regular diet and activity level that you have prior to coming to the hospital.  Please see your PCP within one week to discuss: Any remaining symptoms from this hospitalization and any replacement medications for lisinopril, which you should not take over again.  Reasons to call or visit your PCP (or an Emergency Room if PCP unavailable):  --Medication refills  --Severe symptoms of any kind. FOR EMERGENCIES CALL 911  It was a pleasure to care for you in the hospital.  Hector Shade, MD, and your [facility] Team  03/09/23    Medications -   START taking:  predniSONE (DELTASONE)  Start taking on: March 10, 2023  STOP taking:  lisinopril 5 MG tablet (ZESTRIL)  pregabalin 100 MG capsule (LYRICA)        2. H2H Assessment: Patient reports ability and plan to attend upcoming appointment(s).    03/22/2023 10:20 Colonoscopy  Alphonso Manson [facility]sey      03/27/2023 10:20 [facility]       3. Next Steps:    Home visit offered: N/A    Follow up Plan: weekly     Date of next planned [facility] Contact:  03/15/23         Arnell Asal, CCP, CHW  [facility] Care  Manager  [phone] cell

## 2023-03-15 ENCOUNTER — Telehealth: Payer: Self-pay

## 2023-03-15 ENCOUNTER — Other Ambulatory Visit: Payer: Self-pay

## 2023-03-15 ENCOUNTER — Ambulatory Visit: Payer: Medicare Other | Attending: Internal Medicine

## 2023-03-15 ENCOUNTER — Ambulatory Visit (HOSPITAL_BASED_OUTPATIENT_CLINIC_OR_DEPARTMENT_OTHER): Payer: Self-pay

## 2023-03-15 VITALS — BP 118/60 | HR 119 | Temp 97.7°F

## 2023-03-15 DIAGNOSIS — G43711 Chronic migraine without aura, intractable, with status migrainosus: Secondary | ICD-10-CM | POA: Diagnosis present

## 2023-03-15 DIAGNOSIS — I4729 Other ventricular tachycardia: Secondary | ICD-10-CM | POA: Diagnosis not present

## 2023-03-15 DIAGNOSIS — R000 Tachycardia, unspecified: Secondary | ICD-10-CM | POA: Diagnosis present

## 2023-03-15 LAB — COMPREHENSIVE METABOLIC PANEL
ALANINE AMINOTRANSFERASE: 26 U/L (ref 12–45)
ALBUMIN: 4.4 g/dL (ref 3.4–5.2)
ALKALINE PHOSPHATASE: 89 U/L (ref 45–117)
ANION GAP: 11 mmol/L (ref 10–22)
ASPARTATE AMINOTRANSFERASE: 18 U/L (ref 8–34)
BILIRUBIN TOTAL: 0.2 mg/dL (ref 0.2–1.0)
BUN (UREA NITROGEN): 19 mg/dL — ABNORMAL HIGH (ref 7–18)
CALCIUM: 10.3 mg/dL (ref 8.5–10.5)
CARBON DIOXIDE: 28 mmol/L (ref 21–32)
CHLORIDE: 100 mmol/L (ref 98–107)
CREATININE: 0.6 mg/dL (ref 0.4–1.2)
ESTIMATED GLOMERULAR FILT RATE: >60 mL/min (ref >60)
Glucose Random: 92 mg/dL (ref 74–160)
POTASSIUM: 4.5 mmol/L (ref 3.5–5.1)
SODIUM: 139 mmol/L (ref 136–145)
TOTAL PROTEIN: 7.2 g/dL (ref 6.4–8.2)

## 2023-03-15 LAB — CBC, PLATELET & DIFFERENTIAL
ABSOLUTE BASO COUNT: 0.1 10*3/uL (ref 0.0–0.1)
ABSOLUTE EOSINOPHIL COUNT: 0.3 10*3/uL (ref 0.0–0.8)
ABSOLUTE IMM GRAN COUNT: 0.07 10*3/uL (ref 0.00–0.10)
ABSOLUTE LYMPH COUNT: 3.7 10*3/uL (ref 0.6–5.9)
ABSOLUTE MONO COUNT: 1 10*3/uL (ref 0.2–1.4)
ABSOLUTE NEUTROPHIL COUNT: 8 10*3/uL (ref 1.6–8.3)
ABSOLUTE NRBC COUNT: 0 10*3/uL (ref 0.0–0.0)
BASOPHIL %: 0.5 % (ref 0.0–1.2)
EOSINOPHIL %: 2.4 % (ref 0.0–7.0)
HEMATOCRIT: 47.8 % — ABNORMAL HIGH (ref 34.1–44.9)
HEMOGLOBIN: 15.5 g/dL (ref 11.2–15.7)
IMMATURE GRANULOCYTE %: 0.5 % (ref 0.0–1.0)
LYMPHOCYTE %: 28.3 % (ref 15.0–54.0)
MEAN CORP HGB CONC: 32.4 g/dL (ref 31.0–37.0)
MEAN CORPUSCULAR HGB: 30.5 pg (ref 26.0–34.0)
MEAN CORPUSCULAR VOL: 94.1 fl (ref 80.0–100.0)
MEAN PLATELET VOLUME: 10.8 fL (ref 8.7–12.5)
MONOCYTE %: 7.6 % (ref 4.0–13.0)
NEUTROPHIL %: 60.7 % (ref 40.0–75.0)
NRBC %: 0 % (ref 0.0–0.0)
PLATELET COUNT: 312 10*3/uL (ref 150–400)
RBC DISTRIBUTION WIDTH STD DEV: 49.8 fL — ABNORMAL HIGH (ref 35.1–46.3)
RED BLOOD CELL COUNT: 5.08 M/uL (ref 3.90–5.20)
WHITE BLOOD CELL COUNT: 13.2 10*3/uL — ABNORMAL HIGH (ref 4.0–11.0)

## 2023-03-15 LAB — RBC SEDIMENTATION RATE: RBC SEDIMENTATION RATE: 9 MM/HR (ref 0–30)

## 2023-03-15 LAB — C-REACTIVE PROTEIN: C-REACTIVE PROTEIN: <3 mg/L (ref 0–18)

## 2023-03-15 LAB — THYROID SCREEN TSH REFLEX FT4: THYROID SCREEN TSH REFLEX FT4: 1.34 u[IU]/mL (ref 0.270–4.200)

## 2023-03-15 LAB — EKG

## 2023-03-15 MED ORDER — BUTALBITAL-APAP-CAFFEINE 50-325-40 MG PO TABS
1.0000 | ORAL_TABLET | ORAL | 0 refills | Status: DC | PRN
Start: 2023-03-15 — End: 2023-03-15

## 2023-03-15 MED ORDER — KETOROLAC TROMETHAMINE 30 MG/ML (FOR FAM USE)
30.00 mg | Freq: Once | INTRAMUSCULAR | Status: AC
Start: 2023-03-15 — End: 2023-03-15
  Administered 2023-03-15: 30 mg via INTRAMUSCULAR
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
## 2023-03-15 NOTE — Progress Notes (Signed)
2nd attempt  -CCM out Reach Call attempted to see how Pt is doing, no answer, message left with this writer's return contact information.     Tryton Bodi, LSW, CCP, CHW  CHW Care Manager  [phone] cell

## 2023-03-15 NOTE — Telephone Encounter (Signed)
Reason for Disposition   SEVERE headache (e.g., excruciating) and has had severe headaches before   SEVERE headache and not relieved by pain meds   Patient wants to be seen   Unexplained headache that is present > 24 hours   Similar to previously diagnosed migraine headaches     Similar but longer lasting    Answer Assessment - Initial Assessment Questions  Pt complains of constant headache for 3 days now   She has a history of migraines but they never lasted this long.  Denies: nausea, vomiting, blurry vision  OTC: Tylenol, cold cloth  Recently at ED for allergic reaction to ACE inhibitor and was prescribed prednisone.  She states her blood sugar is in normal range.    Most Recent BP Reading(s)  03/09/23 : 105/72  03/01/23 : 118/74  09/20/22 : 120/80  08/13/22 : 101/67  07/25/22 : 114/60    Protocols used: Headache-A-OH    Advised per nursing triage protocol.  Verbalized understanding and agreement with instructions and disposition.     Recommended disposition for patient:Disposition: See in Office Today    If patient referred to UC/ED advised that they may require further follow up and testing after the visit with their primary care office.     Instructed patient to call back for any new, worsening, or worrisome symptoms or concerns any time day or night.

## 2023-03-15 NOTE — Patient Instructions (Signed)
DO NOT TAKE FIROICET WITH PERCOCET   IF HEADACHE IS NOT IMPROVED- MAY NEED ED VISIT

## 2023-03-15 NOTE — Progress Notes (Signed)
Per orders of Dr. Batshon, 30mg Toradol given by Bladen Umar A Chinyere Galiano, RN.

## 2023-03-16 LAB — COVID-19 OUTPATIENT: COVID-19 OUTPATIENT: NEGATIVE

## 2023-03-18 ENCOUNTER — Telehealth: Payer: Self-pay

## 2023-03-18 ENCOUNTER — Telehealth (HOSPITAL_BASED_OUTPATIENT_CLINIC_OR_DEPARTMENT_OTHER): Payer: Self-pay

## 2023-03-18 ENCOUNTER — Encounter (HOSPITAL_BASED_OUTPATIENT_CLINIC_OR_DEPARTMENT_OTHER): Payer: Self-pay

## 2023-03-18 NOTE — Progress Notes (Signed)
[facility] CHW Called patient for week 2 contact post discharge.    Contact Outcome: Patient reached    1. H2H activities during call: CM called and Pt reported she is all right. Pt was able to attend follow up appt on Friday and was able to review all her concerns.  Pt reported being aware fo upcoming colonoscopy and was abe to get the prep but reported not sure about when she should start drinking it.    CM called [facility] and was able to left a message requesting a RN to call Pt to provide instruction about how to prepare for the procedure.  Pt again denied need for any other services or support.    2. H2H Assessment: Patient reports ability and plan to attend upcoming appointment(s).    Date: 03/22/2023 Time: 1330 Status: Scheduled   Location: [facility] Room: ENDO 01 Service: Gastroenterology     03/27/2023 10:20 Cristal Deer [facility]     3. Next Steps:    Home visit offered: N/A    Follow up Plan: weekly     Date of next planned [facility] Contact:   03/25/23      Arnell Asal, CCP, CHW  [facility] Care Manager  [phone] cell

## 2023-03-18 NOTE — Progress Notes (Signed)
I returned a call to patient and reviewed her standard prep instructions. She will call if she has anymore questions

## 2023-03-20 ENCOUNTER — Telehealth (HOSPITAL_BASED_OUTPATIENT_CLINIC_OR_DEPARTMENT_OTHER): Payer: Self-pay

## 2023-03-20 NOTE — Telephone Encounter (Addendum)
Patient identification verified by 2 forms.    Called placed to patient relayed message below.   She reports she is feeling ok, still has the headache a little bit.   She does feel the toradol did help.   Pain scale current headache about a 4.   Denied any blurred vision and or visual disturbances.   Did not take anything for headache.   Can not take ibuprofen due to side effects.   Has not tried anything for headache at home.   Reviewed to take some acetaminophen, hydrate well.   Denied any palpitations.   Advised will send message to provider for review and recall if needed.  Advised if symptoms worsen and or change to recall office and or present to ED.  Patient verbalized understanding and agreed with plan.   Denied any further questions or concerns at this time.         ----- Message from Fonnie Mu, MD sent at 03/19/2023  5:27 PM EDT -----  RN pool:  Please open telephone encounter and call patient in native language to discuss:    Overall, patient's labs are not suggestive of inflammatory disease causing headaches, which is very good. Thyroid level is normal and she does not have anemia. Her white blood cell count is mildly elevated- which may be suggestive of an infection- how has she been feeling since she was seen in clinic last week? Did the toradol help with her headache? Any palpitations?    Thank you!   Raven

## 2023-03-20 NOTE — Progress Notes (Signed)
Results shared via mychart

## 2023-03-21 ENCOUNTER — Encounter (HOSPITAL_BASED_OUTPATIENT_CLINIC_OR_DEPARTMENT_OTHER): Payer: Self-pay

## 2023-03-21 ENCOUNTER — Telehealth (HOSPITAL_BASED_OUTPATIENT_CLINIC_OR_DEPARTMENT_OTHER): Payer: Self-pay

## 2023-03-21 LAB — EKG

## 2023-03-21 NOTE — Telephone Encounter (Signed)
RX not found in patient chart or PCP folder --requesting [facility] to refax it again.
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
## 2023-03-21 NOTE — Progress Notes (Signed)
Returned patient's phone call.Vague regarding verbal colonoscopy prep instructions.Unable to get into My Chart.Procedure scheduled for tomorrow/arrival 0940 per phone call today.Maintained clear liquids today.Already mixed prep.To drink golytely -all-4 pm to completion or finish remainder before 8 am tomorrow.Aware not to take metformin this evening or tomorrow am.Resume when eating.Had PAT 03/11/23,patient is/has held [facility] for 5 days pre-op per Dr Jeraldine Loots.

## 2023-03-22 ENCOUNTER — Encounter (HOSPITAL_BASED_OUTPATIENT_CLINIC_OR_DEPARTMENT_OTHER): Payer: Self-pay | Admitting: Gastroenterology

## 2023-03-22 ENCOUNTER — Ambulatory Visit (HOSPITAL_BASED_OUTPATIENT_CLINIC_OR_DEPARTMENT_OTHER): Payer: Medicare Other | Admitting: Anesthesiology

## 2023-03-22 ENCOUNTER — Telehealth (HOSPITAL_BASED_OUTPATIENT_CLINIC_OR_DEPARTMENT_OTHER): Payer: Self-pay | Admitting: Internal Medicine

## 2023-03-22 ENCOUNTER — Ambulatory Visit
Admission: RE | Admit: 2023-03-22 | Discharge: 2023-03-22 | Disposition: A | Discharge status: Home / Self Care | Payer: Medicare Other | Attending: Gastroenterology | Admitting: Gastroenterology

## 2023-03-22 ENCOUNTER — Encounter (HOSPITAL_BASED_OUTPATIENT_CLINIC_OR_DEPARTMENT_OTHER)
Admission: RE | Disposition: A | Discharge status: Home / Self Care | Payer: Self-pay | Source: Ambulatory Visit | Attending: Gastroenterology

## 2023-03-22 ENCOUNTER — Other Ambulatory Visit: Payer: Self-pay

## 2023-03-22 DIAGNOSIS — Z6841 Body Mass Index (BMI) 40.0 and over, adult: Secondary | ICD-10-CM | POA: Diagnosis not present

## 2023-03-22 DIAGNOSIS — E119 Type 2 diabetes mellitus without complications: Secondary | ICD-10-CM | POA: Insufficient documentation

## 2023-03-22 DIAGNOSIS — Z86010 Personal history of colonic polyps: Secondary | ICD-10-CM | POA: Diagnosis not present

## 2023-03-22 DIAGNOSIS — E785 Hyperlipidemia, unspecified: Secondary | ICD-10-CM | POA: Diagnosis not present

## 2023-03-22 DIAGNOSIS — K635 Polyp of colon: Secondary | ICD-10-CM

## 2023-03-22 DIAGNOSIS — D123 Benign neoplasm of transverse colon: Secondary | ICD-10-CM | POA: Diagnosis not present

## 2023-03-22 DIAGNOSIS — F17210 Nicotine dependence, cigarettes, uncomplicated: Secondary | ICD-10-CM | POA: Diagnosis not present

## 2023-03-22 DIAGNOSIS — G4733 Obstructive sleep apnea (adult) (pediatric): Secondary | ICD-10-CM | POA: Diagnosis not present

## 2023-03-22 DIAGNOSIS — J42 Unspecified chronic bronchitis: Secondary | ICD-10-CM | POA: Insufficient documentation

## 2023-03-22 DIAGNOSIS — Z09 Encounter for follow-up examination after completed treatment for conditions other than malignant neoplasm: Secondary | ICD-10-CM | POA: Diagnosis not present

## 2023-03-22 DIAGNOSIS — Z1211 Encounter for screening for malignant neoplasm of colon: Secondary | ICD-10-CM | POA: Insufficient documentation

## 2023-03-22 DIAGNOSIS — I10 Essential (primary) hypertension: Secondary | ICD-10-CM | POA: Insufficient documentation

## 2023-03-22 DIAGNOSIS — D126 Benign neoplasm of colon, unspecified: Secondary | ICD-10-CM

## 2023-03-22 DIAGNOSIS — K648 Other hemorrhoids: Secondary | ICD-10-CM | POA: Diagnosis not present

## 2023-03-22 DIAGNOSIS — F419 Anxiety disorder, unspecified: Secondary | ICD-10-CM | POA: Diagnosis not present

## 2023-03-22 DIAGNOSIS — Z96652 Presence of left artificial knee joint: Secondary | ICD-10-CM | POA: Insufficient documentation

## 2023-03-22 DIAGNOSIS — E559 Vitamin D deficiency, unspecified: Secondary | ICD-10-CM | POA: Diagnosis not present

## 2023-03-22 LAB — BLOOD SUGAR FINGERSTICK (POINT OF CARE): FINGERSTICK GLUCOSE: 108 mg/dl (ref 74–160)

## 2023-03-22 SURGERY — COLONOSCOPY
Anesthesia: General

## 2023-03-22 MED ORDER — PROPOFOL INFUSION
INTRAVENOUS | Status: AC
Start: 2023-03-22 — End: 2023-03-22
  Filled 2023-03-22: qty 50

## 2023-03-22 MED ORDER — PROPOFOL 500 MG/50 ML IV
INTRAVENOUS | Status: DC | PRN
Start: 2023-03-22 — End: 2023-03-22
  Administered 2023-03-22: 100 ug/kg/min via INTRAVENOUS

## 2023-03-22 MED ORDER — PHENYLEPHRINE HCL-NACL 0.4-0.9 MG/10ML-% IV SOSY
PREFILLED_SYRINGE | INTRAVENOUS | Status: AC
Start: 2023-03-22 — End: 2023-03-22
  Filled 2023-03-22: qty 10

## 2023-03-22 MED ORDER — LIDOCAINE HCL (PF) 2 % IJ SOLN
INTRAMUSCULAR | Status: AC
Start: 2023-03-22 — End: 2023-03-22
  Filled 2023-03-22: qty 2

## 2023-03-22 MED ORDER — PROPOFOL 200 MG/20 ML IV - AN
Freq: Once | INTRAVENOUS | Status: DC | PRN
Start: 2023-03-22 — End: 2023-03-22
  Administered 2023-03-22: 60 mg via INTRAVENOUS

## 2023-03-22 MED ORDER — LIDOCAINE HCL (PF) 2 % IJ SOLN
Freq: Once | INTRAMUSCULAR | Status: DC | PRN
Start: 2023-03-22 — End: 2023-03-22
  Administered 2023-03-22: 2 mL via INTRAVENOUS

## 2023-03-22 MED ORDER — PHENYLEPHRINE HCL-NACL 0.4-0.9 MG/10ML-% IV SOSY
PREFILLED_SYRINGE | Freq: Once | INTRAVENOUS | Status: DC | PRN
Start: 2023-03-22 — End: 2023-03-22
  Administered 2023-03-22 (×5): 80 ug via INTRAVENOUS

## 2023-03-22 MED ORDER — LACTATED RINGERS IV SOLN
INTRAVENOUS | Status: DC
Start: 2023-03-22 — End: 2023-03-22
  Administered 2023-03-22: 300 mL via INTRAVENOUS

## 2023-03-22 SURGICAL SUPPLY — 6 items
.9% NACL IRRIGATION 500ML (SOLUTION) ×1 IMPLANT
CUSTOM ENDO KIT (PACK) ×1 IMPLANT
ORCA ENDO VALVE (ENDOVLVE) ×1 IMPLANT
RADIAL JAW 4 LG W/NDL 240CM (ENDOCLMP) ×1 IMPLANT
STERILE WATER IRR. 500ML (SOLUTION) ×1 IMPLANT
XL ISOLATION GOWN YELLOW (PPEGOWNS) ×3 IMPLANT

## 2023-03-22 NOTE — Telephone Encounter (Signed)
If patient still had migraine, despite tylenol with no break since visit, patient may need ED visit for iv medication to break migraine cycle.

## 2023-03-22 NOTE — Telephone Encounter (Signed)
Request for Lumbar Orthosis received from [facility] and scanned into media manager under "DME SWO". Please print and put in folders to be signed by PCP & fax to [phone]    Please make sure to fill in requested information prior to faxing.      Thank you

## 2023-03-22 NOTE — Nursing Note (Signed)
Pts oxygen drops to 87-88% at rest.  DR [facility] aware.

## 2023-03-22 NOTE — PROVATION-GI (Signed)
[facility]  Patient Name: Samantha Cameron  MRN: [id number]  CSN: [id number]  Date of Birth: [birth year]  Admit Type: Outpatient  Age: 67  Gender: Female  Note Status: Finalized  Patient Location: [facility] OR 10  Referring MD:          Jamey Reas MD, MD  Procedure Date:        03/22/2023 11:05:44 AM  Procedure:             Colonoscopy  Endoscopist:           Joretta Bachelor MD, MD  Indications for Procedure:       High risk colon cancer surveillance: Personal history of colonic polyps  Medications:           Monitored Anesthesia Care  Procedure:       Just prior to the procedure, an updated history and physical was done. I        obtained an informed consent from the patient reviewing the risk of the        procedure including (but not limited to) respiratory depression, perforation,        bleeding, discomfort, a possible need for surgery and unexpected reactions to        medications. The patient is aware that test has limitations and may not        detect significant lesions such as cancer or other potential diseases. The        patient was also informed that they might need a repeat colonoscopy earlier        than standard guidelines if there are changes in their symptoms or concerning        findings noted. A time out was performed with the entire procedure staff        present. The scope was passed under direct vision. Throughout the procedure,        the patient's blood pressure, pulse, and oxygen saturations were monitored        continuously. The Colonoscope was introduced through the anus and advanced to        the cecum, identified by appendiceal orifice and ileocecal valve. The scope        was then slowly withdrawn with confirmation of the noted findings. The        colonoscopy was performed without difficulty.  Scope In: 12:05:10 PM  Scope Out: 12:21:17 PM  Scope Withdrawal Time: 0 hours 10 minutes 33 seconds   Total Procedure Duration: 0 hours 16 minutes 7 seconds   Findings:        Non-bleeding internal hemorrhoids were found during retroflexion. The        hemorrhoids were medium-sized.       A medium polyp was found in the transverse colon. The polyp was        semi-pedunculated.       A tattoo was seen in the transverse colon. A post-polypectomy scar was found        at the tattoo site.  Post Procedure Diagnosis:       - Non-bleeding internal hemorrhoids.       - One medium polyp in the transverse colon.       - No specimens collected.  Complications:         No immediate complications.  Recommendation:       - Await pathology results.  Joretta Bachelor., MD  Clerance Lav Gwyneth Sprout MD, MD  03/22/2023 12:36:01 PM  This report has been signed electronically.  Number of Addenda: 0  Note Initiated On: 03/22/2023 11:05 AM

## 2023-03-22 NOTE — Anesthesia Preprocedure Evaluation (Signed)
Pre-Anesthetic Note  .      Patient: Samantha Cameron is a 67 year old female      Procedure Information       Date/Time: 03/22/23 1138    Procedure: COLONOSCOPY    Diagnosis: Tubular adenoma of colon [D12.6]    Pre-op diagnosis: Tubular adenoma of colon [D12.6]    [facility]    Surgeons: Joretta Bachelor., MD            Relevant Problems   ANESTHESIA   (+) OSA (obstructive sleep apnea)      PULMONARY   (+) DOE (dyspnea on exertion)   (+) OSA (obstructive sleep apnea)      CARDIO   (+) DOE (dyspnea on exertion)   (+) Essential hypertension   (+) First degree AV block   (+) Internal hemorrhoids   (+) PAC (premature atrial contraction)   (+) SVT (supraventricular tachycardia)   (+) Vascular calcification   (+) Venous (peripheral) insufficiency      GU/RENAL   (+) Fatty liver      ENDO   (+) Type 2 diabetes mellitus with microalbuminuria, without long-term current use of insulin (HCC)           Previous Anesthetic History:   Past Surgical History:  No date: CATARACT EXTRACTION EXTRACAPSULAR W/ INTRAOCULAR LENS   IMPLANTATION      Comment:  OS 03/10/20, OD 01/24/22 Dr.S.M.Patalano  No date: CHOLECYSTECTOMY  No date: FOOT SURGERY      Comment:  bilateral  No date: LAPAROSCOPY SURG CHOLECYSTECTOMY  No date: OB ANTEPARTUM CARE CESAREAN DLVR & POSTPARTUM      Comment:  x3  No date: PR ANES NERVE MUSC TENDON FASCIA&BURSA KNEE&/POPLT  No date: TONSILLECTOMY & ADENOIDECTOMY <AGE 61  04/13/2009: TOTAL KNEE REPLACEMENT      Comment:  left  08/11/2009: WRIST GANGLION EXCISION      Comment:  left         Medications  Current Facility-Administered Medications   Medication    lactated ringers infusion         Allergies:   Review of Patient's Allergies indicates:   Ace inhibitors          Swelling   Codeine camsylate       Shortness of Breath, Other (See                            Comments)    Comment:Chest pain   Motrin [ibuprofen]      Nausea Only   Sulfa antibiotics       Other (See Comments)   Bactrim                  Rash, Itching    Smoking, Alcohol, Drugs:  Social History    Tobacco Use      Smoking status: Every Day        Packs/day: 0.50        Years: 31.00        Additional pack years: 0.00        Total pack years: 15.50        Types: Cigarettes        Last attempt to quit: 03/10/2018        Years since quitting: 5.0      Smokeless tobacco: Never      Tobacco comments: quit smoking inform provided to patient    Alcohol  use: No      Drug use:   No       PMHx:  Past Medical History:  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  No date: Cerumen impaction      Comment:  per steward records  11/18/2018: Chest congestion  02/01/2012: Chest pain  No date: Chronic bronchitis  02/01/2012: Chronic bronchitis with COPD (chronic obstructive   pulmonary disease)  09/14/2015: Cough  No date: Depression      Comment:  per steward records  No date: Diabetes  No date: Disorders of lipoid metabolism  05/23/2018: Epigastric pain      Comment:  05/2018 GI: Assessment: Pt is 7F with PMH COPD, morbid                obesity, H.pylori (2009, unclear if treated) who p/w                severe GERD symptoms, unintentional weight loss (~20 lbs                since 03/2018) and abdominal pain.   1. GERD - severe 2.                Unintentional weight loss 3. Abdominal pain     Plan: 1.                Recommend EGD. This procedure has been fully reviewed                with the patient and written informed consent has been                obtained. 2.   No date: Esophageal reflux  No date: External hemorrhoid       Comment:  per steward records  04/19/2017: Gastritis without bleeding      Comment:  GI endoscopy: 06/2018: Mildly severe reflux esophagitis.               Biopsied.      - Chronic gastritis. Biopsied.      - No                gross lesions in the entire examined duodenum.                Complications:  No immediate complications.                Recommendation:      - Await pathology results. 7/2019GI:               Assessment: Pt is 7F with PMH COPD, morbid obesity,                H.pylori (2009, unclear if treated) who p/w severe GERD  symptoms, unintentional weigh  05/10/2018: Gastroesophageal reflux disease without esophagitis  No date: HTN (hypertension)  02/01/2012: Hypoxia  02/16/2017: Left ear pain  06/07/2015: Left genital labial abscess  06/16/2015: Obesity, Class III, 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y) (HCC)  No date: Obstructive chronic bronchitis with exacerbation (HCC)  08/16/2015: Osteopenia      Comment:  On xray of left knee per steward records 06/2014 Unclear                if addressed  05/23/2018: Pancreatic lesion  09/14/2015: Right ear impacted cerumen  No date: Sleep apnea  08/15/2015: Type 2 diabetes mellitus without complication, without   long-term current use of insulin (HCC)      Comment:   HEMOGLOBIN A1C (%) Date Value 07/15/2015 6.2 (H)                ----------  Steward record - a1c 6.2 on 01/31/15 6.4 on                09/30/14 5.9 on 12/29/13 6.4 08/18/13   11/18/2017: Viral URI with cough  No date: Wears eyeglasses    Vitals  BP 118/74   Pulse 95   Temp 98.3 F (36.8 C) (Temporal)   Resp 16   Ht 5\' 2"  (1.575 m)   Wt 94.3 kg (208 lb)   LMP 10/22/2007 (LMP Unknown)   SpO2 92%   BMI 38.04 kg/m     Anesthesia Evaluation    Physical Exam    General     Level of consciousness:  Alert and oriented (time, person, place)   BMI   BMI greater than 30 kg/m2   Airway     Mallampati:  III    TM distance:  >3 FB    Mouth opening:  >3 FB    Neck ROM:  Mildly Limited   Teeth     Heart      (+) tachycardia     Lungs - normal exam     Other findings: RA sat drifts as low as 87% on RA in pre-op.                  Pertinent Labs:   Lab Results   Component Value Date    NA 139 03/15/2023    K 4.5 03/15/2023    CREAT 0.6 03/15/2023    GLUCOSER 92 03/15/2023    WBC 13.2 (H) 03/15/2023    HCT 47.8 (H) 03/15/2023    PLTA 312 03/15/2023    PT 11.0 04/18/2017    APTT 30.5 04/18/2017    INR 1.0 04/18/2017         Anesthesia Plan    ASA Score:     ASA:  3    Airway:      Mallampati:  III    Mouth opening:  >3 FB    Neck ROM:  Mildly Limited    TM distance:  >3 FB     Plan: general    Other information:     EKG Reviewed: : Yes      Post-Plan::  PACU    Informed Consent:     Anesthetic plan and risks discussed with:  Patient   Patient Consented

## 2023-03-22 NOTE — Telephone Encounter (Signed)
Forms have been printed and placed into the providers folder to be completed and faxed

## 2023-03-22 NOTE — Discharge Instructions (Signed)
Findings:       Non-bleeding internal hemorrhoids were found during retroflexion. The        hemorrhoids were medium-sized.       A medium polyp was found in the transverse colon. The polyp was        semi-pedunculated.       A tattoo was seen in the transverse colon. A post-polypectomy scar was found        at the tattoo site.  Post Procedure Diagnosis:       - Non-bleeding internal hemorrhoids.       - One medium polyp in the transverse colon.       - No specimens collected.  Complications:         No immediate complications.  Recommendation:       - Await pathology results.  Joretta Bachelor., MD  Clerance Lav Gwyneth Sprout MD, MD  03/22/2023 12:36:01 PM

## 2023-03-22 NOTE — Telephone Encounter (Signed)
Patient identification verified by 2 forms.    Called placed to patient relayed message below.   Headache about a 3/10.   Still has not taken any acetaminophen.   Did review to take some and if headache does not improve to present to ED.   Also advised to hydrate.   Did not reports any other symptoms.   Patient verbalized understanding and agreed with plan.   Denied any further questions or concerns at this time.       FYI to Provider

## 2023-03-22 NOTE — Anesthesia Postprocedure Evaluation (Signed)
Anesthesia Post-Operative Evaluation Note    Patient: Samantha Cameron           Procedure Summary       Date: 03/22/23 Room / [facility]    Anesthesia Start: 1149 Anesthesia Stop:     Procedure: COLONOSCOPY Diagnosis:       Tubular adenoma of colon      (Tubular adenoma of colon [D12.6])    Surgeons: Joretta Bachelor., MD Responsible Provider: Rivka Spring, MD    Anesthesia Type: general ASA Status: 3              POST-OPERATIVE EVALUATION    Anesthesia Post Evaluation    Vitals signs in patient's normal range: Yes  Respiratory function stable; airway patent: Yes  Cardiovascular function stable: Yes  Hydration status stable: Yes  Mental status recovered; patient participates in evaluation and/or is at baseline: Yes  Pain control satisfactory: Yes  Nausea and vomiting control satisfactory: YesProcedure was labor & delivery no  PostOP disposition PACU  Anesthesia Observation no significant observation    MIPS#404 Anesthesiology Smoking Abstinence:     The patient is a current smoker (e.g. cigarette, cigar, pipe, e-cigarette/vaping/marijuana) (Z6109): Yes   The patient underwent an elective surgery or procedure requiring anesthesia (U0454) : Yes   The patient received preop smoking cessation instructions prior to the day of surgery or procedure by MD, APC or RN proxy staff [phone]): No     The patient smoked the day of the procedure (B1478): Yes    FOR CODING USE ONLY: IF BLANK G9562    MIPS#477 Multimodal Pain Management:  Emergent - Exclusion case: No  Patient was administered multimodal pain management (two or more drugs and/or interventions excluding systemic opioids) in the perioperative period; occurring at some time between 6 hours prior to anesthesia start time until discharged from: No   List medical reason(s) patient did not receive multimodal pain management (Z3086): no pain anticipated postop  FOR CODING USE ONLY: REASON NOT LISTED V7846    NGEX#528 Perioperative Temperature  Management:  Anesthesia start to Anesthesia end time was 60 minutes or longer (4256F): No   FOR CODING USE ONLY: REASON NOT LISTED U1324    MIPS#430 Adult Prevention of PONV:  Patient received an inhalational anesthetic (MW102): No  FOR CODING USE ONLY: REASON NOT LISTED V2536    MIPS#463 Pediatric Prevention of PONV:  Pediatric patient?: No  FOR CODING USE ONLY: REASON NOT LISTED U4403        [employer] # [id number]          Last vitals    BP: 118/74 (03/22/2023 10:21 AM)  Temp: 98.3 F (36.8 C) (03/22/2023 10:21 AM)  Pulse: 95 (03/22/2023 10:21 AM)  Resp: 16 (03/22/2023 10:21 AM)  SpO2: 92 % (03/22/2023 10:21 AM)

## 2023-03-25 ENCOUNTER — Telehealth (HOSPITAL_BASED_OUTPATIENT_CLINIC_OR_DEPARTMENT_OTHER): Payer: Self-pay

## 2023-03-25 ENCOUNTER — Telehealth (HOSPITAL_BASED_OUTPATIENT_CLINIC_OR_DEPARTMENT_OTHER): Payer: Self-pay | Admitting: Internal Medicine

## 2023-03-25 ENCOUNTER — Telehealth: Payer: Self-pay

## 2023-03-25 NOTE — Telephone Encounter (Signed)
PT-1 Request Confirmation  PT-1 request is submitted  PT-1 Request [phone] is Pending . For pending submissions, please check the portal periodically for updates.  An email notification has been sent to the provider on behalf of whom the PT-1 request was submitted. To update the email address, please visit your profile page.        Below are the details of your pending request.     Trip Summary       Member Home:  [phone] Samantha Cameron [facility] [id number]  Treating Location:  [id number] [facility] [place] [id number]   Treatment Details    Treating facility within member's locality No   Medical treatment type: Z00-Z99 - Factors influencing health status and contact with health services   Duration: 12 Month(s)   Frequency: 12 visit(s) per Month   Transportation Details    Member will require a wheelchair van No   Member will be accompanied by an escort Yes   Member will require a service animal No     PT-1 Request Confirmation  PT-1 request is submitted  PT-1 Request [id number] is Authorized  An email notification has been sent to the provider on behalf of whom the PT-1 request was submitted. To update the email address, please visit your profile page.        Following are the details of your trip.     Trip Summary       Member Home:  [phone] Samantha Cameron [facility] [phone]  Treating Location:  [id number] [facility]  [id number]   Treatment Details    Treating facility within [facility] locality Yes   Medical treatment type: Z00-Z99 - Factors influencing health status and contact with health services   Duration: 12 Month(s)   Frequency: 12 visit(s) per Month   Transportation Details    Member will require a wheelchair van No   Member will be accompanied by an escort Yes   Member will require a service animal No     PT-1 Request Confirmation  PT-1 request is submitted  PT-1 Request  [id number] is Pending . For pending submissions, please check the portal periodically for updates.  An email notification has been sent to the provider on behalf of whom the PT-1 request was submitted. To update the email address, please visit your profile page.        Below are the details of your pending request.     Trip Summary       Member Home:  [phone] Samantha Cameron [facility] [id number]  Treating Location:  [facility] [address] [phone]   Treatment Details    Treating facility within [facility] locality Yes   Medical treatment type: Z00-Z99 - Factors influencing health status and contact with health services   Duration: 12 Month(s)   Frequency: 12 visit(s) per Month   Transportation Details    Member will require a wheelchair van No   Member will be accompanied by an escort Yes   Member will require a service animal No   PT-1 Request Confirmation  PT-1 request is submitted  PT-1 Request [phone] is Authorized  An email notification has been sent to the provider on behalf of whom the PT-1 request was submitted. To update the email address, please visit your profile page.        Following are the details of your trip.     Trip Summary       Member Home:  [phone] Samantha Cameron [facility] [id number]  Treating Location:  [id number] [facility] [id number]   Treatment Details    Treating facility within [facility] locality Yes   Medical treatment type: Z00-Z99 - Factors influencing health status and contact with health services   Duration: 12 Month(s)   Frequency: 12 visit(s) per Month   Transportation Details    Member will require a wheelchair van No   Member will be accompanied by an escort Yes   Member will require a service animal No

## 2023-03-25 NOTE — Telephone Encounter (Signed)
-----   Message from Burnice Logan sent at 03/25/2023 12:32 PM EDT -----  Regarding: PT-1  Samantha Cameron [id number], 67 year old, female    Calls today: Forms  PT1 form   Name of treating facility? [facility] address, [place], [place], Zip  [address], [place], [place] [address]   What are they seeing the specialist for back injury   12 visit(s) per week  12 visit(s) per month   Is wheelchair Zenaida Niece needed? No   Is an escort accompanying the member for assistance with ambulation? yes]     Name of treating facility? [facility] address, [place], [place], Zip  [address], Architectural technologist   What are they seeing the specialist for   5 visit(s) per week  12 visit(s) per month   Is wheelchair Zenaida Niece needed? No   Is an escort accompanying the member for assistance with ambulation? yes]     Name of treating facility? [facility] address, [place], [place], Zip      What are they seeing the specialist for   5 visit(s) per week  12 visit(s) per month   Is wheelchair Zenaida Niece needed? No   Is an escort accompanying the member for assistance with ambulation? yes]     Name of treating facility? [facility]  [address] address, [place], [place], Zip    [address], [place], [place] [address]  What are they seeing the specialist for   5 visit(s) per week  12 visit(s) per month   Is wheelchair Zenaida Niece needed? No   Is an escort accompanying the member for assistance with ambulation? yes]     Person calling on behalf of patient: Patient (self)    CALL BACK NUMBER: [phone]  Best time to call back: any  Cell phone:   Other phone:    Patient's language of care: English    Patient does not need an interpreter.    Patient's PCP: Jamey Reas., MD    Primary Care Home Site:

## 2023-03-25 NOTE — Progress Notes (Signed)
CCM out Reach Call attempted to see how Pt is doing , no answer, message left with this writer's return contact information.     Quandarius Nill, LSW, CCP, CHW  CHW Care Manager  [phone] cell

## 2023-03-25 NOTE — Surgery Post-Op (Signed)
Procedure: Procedure(s):  COLONOSCOPY      Pre-Procedure Diagnosis: Pre-op Diagnosis     * Tubular adenoma of colon [D12.6]     Post-Procedure Diagnosis: Post-op Diagnosis     * Tubular adenoma of colon [D12.6]    Surgeon: Primary: Joretta Bachelor., MD    Assistant (if applicable):     Type of Anesthesia:  Total Intravenous Anesthesia (TIVA)      POST OP CALL:        Able to speak with Patient:  Unable to reach    Left Message:  Not accepting calls        Phineas Semen, RN

## 2023-03-25 NOTE — Telephone Encounter (Signed)
[facility] form received  Form asks for confirmation patient still working with clinic and last office note  Patient does not appear to be getting rx with them  Called patient to discuss  Reports she spoke with them about back brace but decided against  Discussed with patient if she is not working with them I will not complete this form - will be shredded  Patient in agreement  Reminded about hospital follow-up appt Wed

## 2023-03-26 ENCOUNTER — Telehealth: Payer: Self-pay

## 2023-03-26 NOTE — Progress Notes (Signed)
[facility] CHW Called patient for week 3 contact post discharge.    Contact Outcome: Patient reached    1. H2H activities during call: CM called and Pt reported she is doing fine, denied need for any help or support.  CM made Pt aware that PT-1 transportation request was placed yesterday and Pt can already call to schedule transportation as needed.  Pt reported that she has appt with PCP tomorrow and will be able to address any issues she has.      2. H2H Assessment: Patient reports ability and plan to attend upcoming appointment(s).    03/27/2023 10:20 Christopher P Simons [facility]       3. Next Steps:    Home visit offered: N/A    Follow up Plan: weekly     Date of next planned [facility] Contact:   04/02/23      Arnell Asal, CCP, CHW  [place] Care Manager  [phone] cell

## 2023-03-27 ENCOUNTER — Telehealth (HOSPITAL_BASED_OUTPATIENT_CLINIC_OR_DEPARTMENT_OTHER): Payer: Self-pay

## 2023-03-27 ENCOUNTER — Other Ambulatory Visit: Payer: Self-pay

## 2023-03-27 ENCOUNTER — Ambulatory Visit: Payer: Medicare Other | Attending: Internal Medicine | Admitting: Internal Medicine

## 2023-03-27 VITALS — BP 125/73 | HR 97 | Temp 97.3°F | Wt 220.0 lb

## 2023-03-27 DIAGNOSIS — F17200 Nicotine dependence, unspecified, uncomplicated: Secondary | ICD-10-CM | POA: Diagnosis present

## 2023-03-27 DIAGNOSIS — K635 Polyp of colon: Secondary | ICD-10-CM | POA: Insufficient documentation

## 2023-03-27 DIAGNOSIS — I5032 Chronic diastolic (congestive) heart failure: Secondary | ICD-10-CM

## 2023-03-27 DIAGNOSIS — Z8679 Personal history of other diseases of the circulatory system: Secondary | ICD-10-CM | POA: Insufficient documentation

## 2023-03-27 DIAGNOSIS — G43009 Migraine without aura, not intractable, without status migrainosus: Secondary | ICD-10-CM | POA: Diagnosis present

## 2023-03-27 DIAGNOSIS — Z87898 Personal history of other specified conditions: Secondary | ICD-10-CM | POA: Diagnosis present

## 2023-03-27 DIAGNOSIS — Z6841 Body Mass Index (BMI) 40.0 and over, adult: Secondary | ICD-10-CM

## 2023-03-27 DIAGNOSIS — E1129 Type 2 diabetes mellitus with other diabetic kidney complication: Secondary | ICD-10-CM | POA: Insufficient documentation

## 2023-03-27 DIAGNOSIS — R809 Proteinuria, unspecified: Secondary | ICD-10-CM | POA: Insufficient documentation

## 2023-03-27 MED ORDER — SUMATRIPTAN SUCCINATE 50 MG PO TABS
50.0000 mg | ORAL_TABLET | ORAL | 0 refills | Status: DC | PRN
Start: 2023-03-27 — End: 2023-12-31

## 2023-03-27 NOTE — Telephone Encounter (Signed)
Tobacco Cessation/Education Referral Outreach  Call made to Samantha Cameron, regarding Tobacco Cessation/Education Referral. Interpreter not needed.   Patient did not answer,  team member left a voice message to check-in and to provide support/resources. Call back number was provided and team member asked patient to leave a message with best day/time to return call.  I have completed the following communication and reminder steps: Patient notified by telephone  Khloey Chern  Community Health Educator II  Tobacco Cessation Education Program  [phone]    On Behalf of Dr.Simons, Christopher P., MD

## 2023-03-28 DIAGNOSIS — Z79891 Long term (current) use of opiate analgesic: Secondary | ICD-10-CM | POA: Diagnosis not present

## 2023-03-28 DIAGNOSIS — M4727 Other spondylosis with radiculopathy, lumbosacral region: Secondary | ICD-10-CM | POA: Diagnosis not present

## 2023-03-28 LAB — SURGICAL PATH SPECIMEN GASTROINTESTINAL

## 2023-03-30 ENCOUNTER — Encounter (HOSPITAL_BASED_OUTPATIENT_CLINIC_OR_DEPARTMENT_OTHER): Payer: Self-pay | Admitting: Gastroenterology

## 2023-04-02 ENCOUNTER — Telehealth: Payer: Self-pay

## 2023-04-02 NOTE — Progress Notes (Signed)
[facility] CHW Called patient for week 4 contact post discharge.    Contact Outcome: Patient reached    1. H2H activities during call: CM called and Pt reported that she had appt with PCP last week and she is doing fine.  CM was able to review letter from Dr Manson Passey and Pt is aware that she will need to do another colonoscopy in 5 years.      2. H2H Assessment: Patient reports ability and plan to attend upcoming appointment(s).    06/10/2023 13:30 [facility]       3. Next Steps:    Home visit offered: N/A    Follow up Plan: weekly     Date of next planned [facility] Contact:   04/09/23        Arnell Asal, CCP, CHW  [facility] Care Manager  [phone] cell

## 2023-04-05 DIAGNOSIS — I4729 Other ventricular tachycardia: Secondary | ICD-10-CM | POA: Insufficient documentation

## 2023-04-05 DIAGNOSIS — G43711 Chronic migraine without aura, intractable, with status migrainosus: Secondary | ICD-10-CM

## 2023-04-05 HISTORY — DX: Chronic migraine without aura, intractable, with status migrainosus: G43.711

## 2023-04-05 NOTE — Assessment & Plan Note (Signed)
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

## 2023-04-05 NOTE — Assessment & Plan Note (Signed)
Hx of migraines, today worsening, lasting for several days  No FND  Will try toradol for headache  May also be making HR worse given pain   Discussed given duration of headache, may need stronger IV medication for cycle blocking- including compazine, steroids, ivf, etc   Patient would like to try toradol first, if no improvements, consider ED

## 2023-04-10 ENCOUNTER — Other Ambulatory Visit (HOSPITAL_BASED_OUTPATIENT_CLINIC_OR_DEPARTMENT_OTHER): Payer: Self-pay | Admitting: Internal Medicine

## 2023-04-10 ENCOUNTER — Telehealth: Payer: Self-pay

## 2023-04-10 MED ORDER — ALUM & MAG HYDROXIDE-SIMETH 200-200-20 MG/5ML PO SUSP
30.0000 mL | Freq: Four times a day (QID) | ORAL | 7 refills | Status: DC | PRN
Start: 2023-04-10 — End: 2023-09-10

## 2023-04-10 NOTE — Progress Notes (Signed)
H2H CHW completed [facility] Program care transition work with this patient.  Date of last contact: 04/10/23    Outcome: H2H CHW was able to engage patient and connect her with the following services. Pt denied need for any services or support. Pt is connected with PCP and Med Specialties. CM reinforced with pt that she can call PCP's Office any time if help is needed.    [facility] CHW will no longer contact this patient, but can re-enroll patient if hospitalized in the future.  Patient instructed to contact PCP office with additional help needs.      Percell [place], Rolene Course, CCP, CHW  [facility] Care Manager  [phone] cell

## 2023-04-12 NOTE — Telephone Encounter (Signed)
2ND REQUEST     Request for Lumbar Orthosis received from [facility] and scanned into media manager under "DME SWO". Please print and put in folders to be signed by PCP & fax to [phone]     Please make sure to fill in requested information prior to faxing.      Thank you

## 2023-04-16 ENCOUNTER — Encounter (HOSPITAL_BASED_OUTPATIENT_CLINIC_OR_DEPARTMENT_OTHER): Payer: Self-pay | Admitting: Student in an Organized Health Care Education/Training Program

## 2023-04-16 NOTE — Progress Notes (Signed)
Fax received from [facility] for back brace  Hx of spinal stenosis and LBP  Signed on behalf of PCP in coverage  Placed in the "to be scanned/faxed" area of blue pod  04/16/23  Clista Bernhardt, PA-C

## 2023-04-25 DIAGNOSIS — M4727 Other spondylosis with radiculopathy, lumbosacral region: Secondary | ICD-10-CM | POA: Diagnosis not present

## 2023-04-29 DIAGNOSIS — M5417 Radiculopathy, lumbosacral region: Secondary | ICD-10-CM | POA: Diagnosis not present

## 2023-05-17 ENCOUNTER — Telehealth (HOSPITAL_BASED_OUTPATIENT_CLINIC_OR_DEPARTMENT_OTHER): Payer: Self-pay | Admitting: Internal Medicine

## 2023-05-17 NOTE — Telephone Encounter (Signed)
Request for UV Wand Therapy Device received from [employer] and scanned into Careers information officer under "DME SWO". Please print and put in folders to be signed by PCP & fax to [phone]    Please make sure to fill in requested information prior to faxing.      Thank you

## 2023-05-20 ENCOUNTER — Encounter (HOSPITAL_BASED_OUTPATIENT_CLINIC_OR_DEPARTMENT_OTHER): Payer: Self-pay | Admitting: Internal Medicine

## 2023-05-20 ENCOUNTER — Telehealth (HOSPITAL_BASED_OUTPATIENT_CLINIC_OR_DEPARTMENT_OTHER): Payer: Self-pay | Admitting: Internal Medicine

## 2023-05-20 NOTE — Telephone Encounter (Signed)
Forms have been printed and placed into the provider folder to be completed and faxed

## 2023-05-20 NOTE — Telephone Encounter (Signed)
Forms have been printed and placed into the provider folder to be completed and faxed

## 2023-05-20 NOTE — Progress Notes (Signed)
[employer] form for UV phototherapy wand for patient  Called to discuss - patient reports not needed  Form discarded  Encouraged patient to call this company to notify them as well    Asks for jury duty excusal letter  No clear medical reason for excusal - denied at this time

## 2023-05-21 DIAGNOSIS — M5136 Other intervertebral disc degeneration, lumbar region: Secondary | ICD-10-CM | POA: Diagnosis not present

## 2023-05-21 DIAGNOSIS — Z79891 Long term (current) use of opiate analgesic: Secondary | ICD-10-CM | POA: Diagnosis not present

## 2023-05-21 DIAGNOSIS — M4727 Other spondylosis with radiculopathy, lumbosacral region: Secondary | ICD-10-CM | POA: Diagnosis not present

## 2023-06-10 ENCOUNTER — Encounter (HOSPITAL_BASED_OUTPATIENT_CLINIC_OR_DEPARTMENT_OTHER): Payer: Self-pay | Admitting: Cardiovascular Disease

## 2023-06-10 ENCOUNTER — Ambulatory Visit: Payer: Medicare Other | Attending: Cardiovascular Disease | Admitting: Cardiovascular Disease

## 2023-06-10 ENCOUNTER — Other Ambulatory Visit: Payer: Self-pay

## 2023-06-10 VITALS — BP 96/69 | HR 107 | Temp 96.7°F | Wt 217.0 lb

## 2023-06-10 DIAGNOSIS — I471 Supraventricular tachycardia, unspecified: Secondary | ICD-10-CM | POA: Diagnosis present

## 2023-06-10 DIAGNOSIS — I10 Essential (primary) hypertension: Secondary | ICD-10-CM | POA: Insufficient documentation

## 2023-06-10 DIAGNOSIS — I700 Atherosclerosis of aorta: Secondary | ICD-10-CM | POA: Diagnosis present

## 2023-06-10 DIAGNOSIS — G4733 Obstructive sleep apnea (adult) (pediatric): Secondary | ICD-10-CM | POA: Insufficient documentation

## 2023-06-10 DIAGNOSIS — G43009 Migraine without aura, not intractable, without status migrainosus: Secondary | ICD-10-CM | POA: Insufficient documentation

## 2023-06-10 DIAGNOSIS — K635 Polyp of colon: Secondary | ICD-10-CM | POA: Insufficient documentation

## 2023-06-10 DIAGNOSIS — F17200 Nicotine dependence, unspecified, uncomplicated: Secondary | ICD-10-CM | POA: Diagnosis present

## 2023-06-10 MED ORDER — ATORVASTATIN CALCIUM 40 MG PO TABS
40.0000 mg | ORAL_TABLET | Freq: Every evening | ORAL | 3 refills | Status: DC
Start: 2023-06-10 — End: 2024-06-29
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
## 2023-06-10 NOTE — Patient Instructions (Signed)
Increase atorvastatin to 40mg .  If you are doing well with this we could go to 80mg  in the future    Continue to try to quit smoking    Try to walk more    Get a new sleep study

## 2023-06-10 NOTE — Progress Notes (Signed)
PT safe at home -DJ

## 2023-06-14 ENCOUNTER — Emergency Department (HOSPITAL_BASED_OUTPATIENT_CLINIC_OR_DEPARTMENT_OTHER): Payer: Medicare Other

## 2023-06-14 ENCOUNTER — Other Ambulatory Visit: Payer: Self-pay

## 2023-06-14 ENCOUNTER — Encounter (HOSPITAL_BASED_OUTPATIENT_CLINIC_OR_DEPARTMENT_OTHER): Payer: Self-pay

## 2023-06-14 ENCOUNTER — Emergency Department
Admission: EM | Admit: 2023-06-14 | Discharge: 2023-06-14 | Disposition: A | Discharge status: Home / Self Care | Payer: Medicare Other | Attending: Emergency Medicine | Admitting: Emergency Medicine

## 2023-06-14 DIAGNOSIS — Z720 Tobacco use: Secondary | ICD-10-CM

## 2023-06-14 DIAGNOSIS — R0600 Dyspnea, unspecified: Secondary | ICD-10-CM | POA: Diagnosis not present

## 2023-06-14 DIAGNOSIS — R5383 Other fatigue: Secondary | ICD-10-CM | POA: Diagnosis not present

## 2023-06-14 DIAGNOSIS — R1013 Epigastric pain: Secondary | ICD-10-CM | POA: Diagnosis not present

## 2023-06-14 DIAGNOSIS — J449 Chronic obstructive pulmonary disease, unspecified: Secondary | ICD-10-CM | POA: Diagnosis not present

## 2023-06-14 DIAGNOSIS — R079 Chest pain, unspecified: Secondary | ICD-10-CM

## 2023-06-14 DIAGNOSIS — T6594XD Toxic effect of unspecified substance, undetermined, subsequent encounter: Secondary | ICD-10-CM | POA: Diagnosis not present

## 2023-06-14 DIAGNOSIS — R0602 Shortness of breath: Secondary | ICD-10-CM

## 2023-06-14 DIAGNOSIS — R0789 Other chest pain: Secondary | ICD-10-CM

## 2023-06-14 DIAGNOSIS — E119 Type 2 diabetes mellitus without complications: Secondary | ICD-10-CM

## 2023-06-14 LAB — COMPREHENSIVE METABOLIC PANEL
ALANINE AMINOTRANSFERASE: 16 U/L (ref 12–45)
ALBUMIN: 4.2 g/dL (ref 3.4–5.2)
ALKALINE PHOSPHATASE: 96 U/L (ref 45–117)
ANION GAP: 14 mmol/L (ref 10–22)
ASPARTATE AMINOTRANSFERASE: 15 U/L (ref 8–34)
BILIRUBIN TOTAL: 0.2 mg/dL (ref 0.2–1.0)
BUN (UREA NITROGEN): 16 mg/dL (ref 7–18)
CALCIUM: 9.5 mg/dL (ref 8.5–10.5)
CARBON DIOXIDE: 23 mmol/L (ref 21–32)
CHLORIDE: 103 mmol/L (ref 98–107)
CREATININE: 0.7 mg/dL (ref 0.4–1.2)
ESTIMATED GLOMERULAR FILT RATE: >60 mL/min (ref >60)
Glucose Random: 113 mg/dL (ref 74–160)
POTASSIUM: 4.7 mmol/L (ref 3.5–5.1)
SODIUM: 140 mmol/L (ref 136–145)
TOTAL PROTEIN: 7.1 g/dL (ref 6.4–8.2)

## 2023-06-14 LAB — CBC, PLATELET & DIFFERENTIAL
ABSOLUTE BASO COUNT: 0 10*3/uL (ref 0.0–0.1)
ABSOLUTE EOSINOPHIL COUNT: 0.4 10*3/uL (ref 0.0–0.8)
ABSOLUTE IMM GRAN COUNT: 0.04 10*3/uL (ref 0.00–0.10)
ABSOLUTE LYMPH COUNT: 4.3 10*3/uL (ref 0.6–5.9)
ABSOLUTE MONO COUNT: 0.9 10*3/uL (ref 0.2–1.4)
ABSOLUTE NEUTROPHIL COUNT: 7.2 10*3/uL (ref 1.6–8.3)
ABSOLUTE NRBC COUNT: 0 10*3/uL (ref 0.0–0.0)
BASOPHIL %: 0.3 % (ref 0.0–1.2)
EOSINOPHIL %: 3.1 % (ref 0.0–7.0)
HEMATOCRIT: 44.3 % (ref 34.1–44.9)
HEMOGLOBIN: 14.6 g/dL (ref 11.2–15.7)
IMMATURE GRANULOCYTE %: 0.3 % (ref 0.0–1.0)
LYMPHOCYTE %: 33.2 % (ref 15.0–54.0)
MEAN CORP HGB CONC: 33 g/dL (ref 31.0–37.0)
MEAN CORPUSCULAR HGB: 30.6 pg (ref 26.0–34.0)
MEAN CORPUSCULAR VOL: 92.9 fl (ref 80.0–100.0)
MEAN PLATELET VOLUME: 11 fL (ref 8.7–12.5)
MONOCYTE %: 7.2 % (ref 4.0–13.0)
NEUTROPHIL %: 55.9 % (ref 40.0–75.0)
NRBC %: 0 % (ref 0.0–0.0)
PLATELET COUNT: 289 10*3/uL (ref 150–400)
RBC DISTRIBUTION WIDTH STD DEV: 49.3 fL — ABNORMAL HIGH (ref 35.1–46.3)
RED BLOOD CELL COUNT: 4.77 M/uL (ref 3.90–5.20)
WHITE BLOOD CELL COUNT: 12.9 10*3/uL — ABNORMAL HIGH (ref 4.0–11.0)

## 2023-06-14 LAB — TROPONIN T HS 3 HOUR
DELTA 3 HOUR TROPONIN T HS: 1 ng/L (ref 0–7)
TROPONIN T HS 3 HOUR RESULT: 7 ng/L (ref 0–10)

## 2023-06-14 LAB — TROPONIN T HS BASELINE: TROPONIN T HS BASELINE: 6 ng/L (ref 0–10)

## 2023-06-14 LAB — LIPASE: LIPASE: 94 U/L — ABNORMAL HIGH (ref 13–60)

## 2023-06-14 LAB — HOLD BLUE TOP TUBE

## 2023-06-14 LAB — TROPONIN T HS 1 HOUR
DELTA 1 HOUR TROPONIN T HS: 1 ng/L (ref 0–4)
TROPONIN T HS 1 HOUR RESULT: 7 ng/L (ref 0–10)

## 2023-06-14 LAB — EKG

## 2023-06-14 MED ORDER — ALUMINUM & MAGNESIUM HYDROXIDE 200-200 MG/5ML PO SUSP
30.00 mL | Freq: Once | ORAL | Status: AC
Start: 2023-06-14 — End: 2023-06-14
  Administered 2023-06-14: 30 mL via ORAL
  Filled 2023-06-14: qty 30

## 2023-06-14 MED ORDER — ALUMINUM & MAGNESIUM HYDROXIDE 200-200 MG/5ML PO SUSP
10.00 mL | Freq: Four times a day (QID) | ORAL | 0 refills | Status: AC | PRN
Start: 2023-06-14 — End: 2023-07-14

## 2023-06-14 MED ORDER — FAMOTIDINE (PF) 20 MG/2ML IV SOLN
20.00 mg | Freq: Once | INTRAVENOUS | Status: AC
Start: 2023-06-14 — End: 2023-06-14
  Administered 2023-06-14: 20 mg via INTRAVENOUS
  Filled 2023-06-14: qty 2

## 2023-06-14 NOTE — Narrator Note (Signed)
Patient Disposition  Patient education for diagnosis, medications, activity, diet and follow-up.  Patient left ED 8:45 AM.  Patient rep received written instructions.    Interpreter to provide instructions: No    Patient belongings with patient: YES    Have all existing LDAs been addressed? Yes    Have all IV infusions been stopped? N/A    Destination: Discharged to home

## 2023-06-14 NOTE — ED Notes (Signed)
ED Attending Physician Sign Out Note     I received sign out from: Dr. Nancy Marus at 0700.    ED Course as of 06/14/23 0844   Fri Jun 14, 2023   0732 67 y/o F hx of COPD, HFPEF, with epigastric pain today, general malaise and URI symptoms.  Wheezy at triage.  [ ]  F/U 3 hr trop  [ ]  Home if negative   0837 Patient feels clinically better.  She is not on oxygen.  She can be discharged.   1610 Her abdominal pain is completely resolved.  Apart from mildly elevated lipase nonspecific white blood cell count, her LFTs are normal she is nontender and can be discharged.   9604 EKG similar to prior EKG in the chart.           Disposition     Diagnosis:   Epigastric abdominal pain    Disposition:   Discharge    Winfield Rast, MD  Emergency Department Attending Physician  [facility]

## 2023-06-14 NOTE — Narrator Note (Signed)
Pt sleeping. NAD. Respirations regular and unlabored.

## 2023-06-14 NOTE — Narrator Note (Signed)
Report to Heather RN

## 2023-06-14 NOTE — Narrator Note (Signed)
Patient resting on stretcher, breathing unlabored.  VS reassessed, stable. Denies pain.  Side rails x 2, call bell in reach.  Given ginger ale per request.
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
## 2023-06-14 NOTE — ED Triage Note (Signed)
Patient arrives by ambulance with chest pain and SOB,  h/o COPD. Bronchitis; EMS reports wheezing, SAT 89% upon arrival recovered to 98% after duoneb    Presents sleepy, states chest tightness, sob, maintaining sat at 90-92% 2L NC

## 2023-06-18 DIAGNOSIS — M4727 Other spondylosis with radiculopathy, lumbosacral region: Secondary | ICD-10-CM | POA: Diagnosis not present

## 2023-06-18 DIAGNOSIS — M5136 Other intervertebral disc degeneration, lumbar region: Secondary | ICD-10-CM | POA: Diagnosis not present

## 2023-06-24 ENCOUNTER — Other Ambulatory Visit: Payer: Self-pay

## 2023-06-24 ENCOUNTER — Ambulatory Visit: Payer: Medicare Other | Attending: Internal Medicine | Admitting: Internal Medicine

## 2023-06-24 VITALS — BP 116/88 | HR 106 | Temp 96.7°F | Wt 223.6 lb

## 2023-06-24 DIAGNOSIS — D72829 Elevated white blood cell count, unspecified: Secondary | ICD-10-CM | POA: Insufficient documentation

## 2023-06-24 DIAGNOSIS — R000 Tachycardia, unspecified: Secondary | ICD-10-CM | POA: Diagnosis not present

## 2023-06-24 DIAGNOSIS — R809 Proteinuria, unspecified: Secondary | ICD-10-CM | POA: Insufficient documentation

## 2023-06-24 DIAGNOSIS — E1129 Type 2 diabetes mellitus with other diabetic kidney complication: Secondary | ICD-10-CM | POA: Diagnosis present

## 2023-06-24 DIAGNOSIS — K219 Gastro-esophageal reflux disease without esophagitis: Secondary | ICD-10-CM | POA: Diagnosis present

## 2023-06-24 DIAGNOSIS — R748 Abnormal levels of other serum enzymes: Secondary | ICD-10-CM | POA: Diagnosis present

## 2023-06-24 DIAGNOSIS — R1010 Upper abdominal pain, unspecified: Secondary | ICD-10-CM | POA: Insufficient documentation

## 2023-06-24 DIAGNOSIS — K2100 Gastro-esophageal reflux disease with esophagitis, without bleeding: Secondary | ICD-10-CM | POA: Diagnosis not present

## 2023-06-24 DIAGNOSIS — K2950 Unspecified chronic gastritis without bleeding: Secondary | ICD-10-CM | POA: Diagnosis not present

## 2023-06-24 LAB — COMPREHENSIVE METABOLIC PANEL
ALANINE AMINOTRANSFERASE: 21 U/L (ref 12–45)
ALBUMIN: 4.2 g/dL (ref 3.4–5.2)
ALKALINE PHOSPHATASE: 85 U/L (ref 45–117)
ANION GAP: 11 mmol/L (ref 10–22)
ASPARTATE AMINOTRANSFERASE: 20 U/L (ref 8–34)
BILIRUBIN TOTAL: 0.2 mg/dL (ref 0.2–1.0)
BUN (UREA NITROGEN): 12 mg/dL (ref 7–18)
CALCIUM: 9.8 mg/dL (ref 8.5–10.5)
CARBON DIOXIDE: 24 mmol/L (ref 21–32)
CHLORIDE: 106 mmol/L (ref 98–107)
CREATININE: 0.7 mg/dL (ref 0.4–1.2)
ESTIMATED GLOMERULAR FILT RATE: >60 mL/min (ref >60)
Glucose Random: 135 mg/dL (ref 74–160)
POTASSIUM: 4.2 mmol/L (ref 3.5–5.1)
SODIUM: 141 mmol/L (ref 136–145)
TOTAL PROTEIN: 6.9 g/dL (ref 6.4–8.2)

## 2023-06-24 LAB — CBC, PLATELET & DIFFERENTIAL
ABSOLUTE BASO COUNT: 0 10*3/uL (ref 0.0–0.1)
ABSOLUTE EOSINOPHIL COUNT: 0.3 10*3/uL (ref 0.0–0.8)
ABSOLUTE IMM GRAN COUNT: 0.03 10*3/uL (ref 0.00–0.10)
ABSOLUTE LYMPH COUNT: 2.6 10*3/uL (ref 0.6–5.9)
ABSOLUTE MONO COUNT: 0.7 10*3/uL (ref 0.2–1.4)
ABSOLUTE NEUTROPHIL COUNT: 7.2 10*3/uL (ref 1.6–8.3)
ABSOLUTE NRBC COUNT: 0 10*3/uL (ref 0.0–0.0)
BASOPHIL %: 0.4 % (ref 0.0–1.2)
EOSINOPHIL %: 3 % (ref 0.0–7.0)
HEMATOCRIT: 42.6 % (ref 34.1–44.9)
HEMOGLOBIN: 13.8 g/dL (ref 11.2–15.7)
IMMATURE GRANULOCYTE %: 0.3 % (ref 0.0–1.0)
LYMPHOCYTE %: 23.7 % (ref 15.0–54.0)
MEAN CORP HGB CONC: 32.4 g/dL (ref 31.0–37.0)
MEAN CORPUSCULAR HGB: 30.5 pg (ref 26.0–34.0)
MEAN CORPUSCULAR VOL: 94.2 fl (ref 80.0–100.0)
MEAN PLATELET VOLUME: 11 fL (ref 8.7–12.5)
MONOCYTE %: 6.5 % (ref 4.0–13.0)
NEUTROPHIL %: 66.1 % (ref 40.0–75.0)
NRBC %: 0 % (ref 0.0–0.0)
PLATELET COUNT: 276 10*3/uL (ref 150–400)
RBC DISTRIBUTION WIDTH STD DEV: 48.6 fL — ABNORMAL HIGH (ref 35.1–46.3)
RED BLOOD CELL COUNT: 4.52 M/uL (ref 3.90–5.20)
WHITE BLOOD CELL COUNT: 10.9 10*3/uL (ref 4.0–11.0)

## 2023-06-24 LAB — LIPASE: LIPASE: 49 U/L (ref 13–60)

## 2023-06-24 LAB — HEMOGLOBIN A1C
ESTIMATED AVERAGE GLUCOSE: 137 mg/dL (ref 74–160)
HEMOGLOBIN A1C: 6.4 % — ABNORMAL HIGH (ref 4.0–5.6)

## 2023-06-24 MED ORDER — FAMOTIDINE 20 MG PO TABS
20.0000 mg | ORAL_TABLET | Freq: Two times a day (BID) | ORAL | 1 refills | Status: DC
Start: 2023-06-24 — End: 2023-09-30
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
## 2023-06-26 ENCOUNTER — Other Ambulatory Visit (HOSPITAL_BASED_OUTPATIENT_CLINIC_OR_DEPARTMENT_OTHER): Payer: Self-pay

## 2023-06-26 NOTE — Progress Notes (Signed)
CCM Screening:    Pt referred to CCM by High Risk List, reason for referral: ED/inpatient utilization, CHF, and Diabetes    Chart review done.    ACO: [facility]  Primary Insurance: Medicare and [employer]    CCM enrolled in the past year? No    Pt is a 67 year old female with Controlled type 2 DM, atherosclerosis of aorta, diastolic dysfunction with chronic heart failure, morbid obesity and multiple comorbidities       Assessment:    Patient Meets criteria for CCM. CM Assessment started: Yes Patient assigned to Mick Sell to begin outreach. Date added to Care Team:06/26/2023  Routine      Additional Recommendations or Comments:none

## 2023-07-03 ENCOUNTER — Telehealth (HOSPITAL_BASED_OUTPATIENT_CLINIC_OR_DEPARTMENT_OTHER): Payer: Self-pay

## 2023-07-03 NOTE — Progress Notes (Signed)
CCM Admission    Patient contacted via phone  Care management services explained and questions answered. Patient consented to CCM.     Patient/family informed on how to access care management patient's rights and responsibilities via [employer], mailed brochure, or verbal discussion with or without interpreter: CM Response: Yes    Additional discussion/education/interventions with patient/family:  with Controlled type 2 DM, atherosclerosis of aorta, diastolic dysfunction with chronic heart failure, morbid obesity and multiple comorbidities .  Seen in ED on 06/14/23 with chest pain and SOB,  h/o COPD. Bronchitis; EMS reports wheezing, SAT 89% upon arrival recovered to 98% after duoneb.  She is not on home O2 and denies needing it. Uses only albuterol rescue inhaler.  States she used to be on [employer] but no longer takes it. Does not have grab bars in the bathroom. Uses the towel rack to hold onto.  No involvement with elder services but willing to be referred.    PLAN:     CM Type: CCM - Plan to complete CM Assessment: Completed 07/03/2023    Next steps: Online referral to [facility] completed.  Determine whether pt is still smoking, does she use her CPAP, is she up to date on vaccinations?  Planned next contact date: week of 07/08/23

## 2023-07-08 ENCOUNTER — Other Ambulatory Visit: Payer: Self-pay

## 2023-07-08 ENCOUNTER — Ambulatory Visit: Payer: Medicare Other | Attending: Internal Medicine | Admitting: Internal Medicine

## 2023-07-08 VITALS — BP 133/83 | HR 100 | Temp 97.7°F | Wt 220.0 lb

## 2023-07-08 DIAGNOSIS — E1129 Type 2 diabetes mellitus with other diabetic kidney complication: Secondary | ICD-10-CM | POA: Insufficient documentation

## 2023-07-08 DIAGNOSIS — R809 Proteinuria, unspecified: Secondary | ICD-10-CM | POA: Diagnosis present

## 2023-07-08 DIAGNOSIS — Z6841 Body Mass Index (BMI) 40.0 and over, adult: Secondary | ICD-10-CM | POA: Insufficient documentation

## 2023-07-08 DIAGNOSIS — M21619 Bunion of unspecified foot: Secondary | ICD-10-CM | POA: Insufficient documentation

## 2023-07-08 DIAGNOSIS — Z9181 History of falling: Secondary | ICD-10-CM | POA: Diagnosis present

## 2023-07-08 DIAGNOSIS — M2042 Other hammer toe(s) (acquired), left foot: Secondary | ICD-10-CM | POA: Diagnosis present

## 2023-07-08 DIAGNOSIS — I10 Essential (primary) hypertension: Secondary | ICD-10-CM | POA: Diagnosis present

## 2023-07-08 DIAGNOSIS — M2041 Other hammer toe(s) (acquired), right foot: Secondary | ICD-10-CM | POA: Diagnosis present

## 2023-07-08 LAB — URINALYSIS
BILIRUBIN, URINE: NEGATIVE
GLUCOSE, URINE: NEGATIVE MG/DL
KETONE, URINE: NEGATIVE MG/DL
LEUKOCYTE ESTERASE: NEGATIVE
NITRITE, URINE: NEGATIVE
OCCULT BLOOD, URINE: NEGATIVE
PH URINE: 6 (ref 5.0–8.0)
SPECIFIC GRAVITY URINE: 1.003 (ref 1.003–1.035)
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
## 2023-07-10 ENCOUNTER — Encounter (HOSPITAL_BASED_OUTPATIENT_CLINIC_OR_DEPARTMENT_OTHER): Payer: Self-pay | Admitting: Internal Medicine

## 2023-07-12 ENCOUNTER — Telehealth (HOSPITAL_BASED_OUTPATIENT_CLINIC_OR_DEPARTMENT_OTHER): Payer: Self-pay

## 2023-07-16 DIAGNOSIS — M4727 Other spondylosis with radiculopathy, lumbosacral region: Secondary | ICD-10-CM | POA: Diagnosis not present

## 2023-07-16 DIAGNOSIS — M5136 Other intervertebral disc degeneration, lumbar region: Secondary | ICD-10-CM | POA: Diagnosis not present

## 2023-07-21 ENCOUNTER — Emergency Department
Admission: EM | Admit: 2023-07-21 | Discharge: 2023-07-21 | Disposition: A | Discharge status: Home / Self Care | Payer: Medicare Other | Attending: Emergency Medicine | Admitting: Emergency Medicine

## 2023-07-21 ENCOUNTER — Emergency Department (HOSPITAL_BASED_OUTPATIENT_CLINIC_OR_DEPARTMENT_OTHER): Payer: Medicare Other

## 2023-07-21 DIAGNOSIS — R079 Chest pain, unspecified: Secondary | ICD-10-CM

## 2023-07-21 DIAGNOSIS — R000 Tachycardia, unspecified: Secondary | ICD-10-CM | POA: Diagnosis not present

## 2023-07-21 DIAGNOSIS — R0602 Shortness of breath: Secondary | ICD-10-CM | POA: Insufficient documentation

## 2023-07-21 DIAGNOSIS — R1013 Epigastric pain: Secondary | ICD-10-CM | POA: Diagnosis present

## 2023-07-21 DIAGNOSIS — K219 Gastro-esophageal reflux disease without esophagitis: Secondary | ICD-10-CM | POA: Diagnosis not present

## 2023-07-21 LAB — COMPREHENSIVE METABOLIC PANEL
ALANINE AMINOTRANSFERASE: 15 U/L (ref 12–45)
ALBUMIN: 4 g/dL (ref 3.4–5.2)
ALKALINE PHOSPHATASE: 85 U/L (ref 45–117)
ANION GAP: 13 mmol/L (ref 10–22)
ASPARTATE AMINOTRANSFERASE: 19 U/L (ref 8–34)
BILIRUBIN TOTAL: 0.2 mg/dL (ref 0.2–1.0)
BUN (UREA NITROGEN): 14 mg/dL (ref 7–18)
CALCIUM: 9.1 mg/dL (ref 8.5–10.5)
CARBON DIOXIDE: 23 mmol/L (ref 21–32)
CHLORIDE: 105 mmol/L (ref 98–107)
CREATININE: 0.7 mg/dL (ref 0.4–1.2)
ESTIMATED GLOMERULAR FILT RATE: >60 mL/min (ref >60)
Glucose Random: 112 mg/dL (ref 74–160)
POTASSIUM: 3.9 mmol/L (ref 3.5–5.1)
SODIUM: 141 mmol/L (ref 136–145)
TOTAL PROTEIN: 6.3 g/dL — ABNORMAL LOW (ref 6.4–8.2)

## 2023-07-21 LAB — CBC, PLATELET & DIFFERENTIAL
ABSOLUTE BASOPHIL COUNT MANUAL: 0 10*3/uL (ref 0.0–0.1)
ABSOLUTE EO COUNT MANUAL: 0.3 10*3/uL (ref 0.0–0.8)
ABSOLUTE LYMPH COUNT MANUAL: 3.5 10*3/uL (ref 0.6–5.9)
ABSOLUTE MONOCYTE COUNT MANUAL: 1.3 10*3/uL (ref 0.2–1.4)
ABSOLUTE NEUT COUNT MANUAL: 8.3 THuL (ref 1.6–8.3)
ABSOLUTE NRBC COUNT: 0 10*3/uL (ref 0.0–0.0)
ATYPICAL LYMPHOCYTES %: 4 % (ref 0.0–12.0)
BAND NEUTROPHILS %: 3 % (ref 0.0–8.0)
BASOPHILS %: 0 % (ref 0.0–1.2)
EOSINOPHILS %: 2 % (ref 0.0–7.0)
HEMATOCRIT: 39.8 % (ref 34.1–44.9)
HEMOGLOBIN: 13.1 g/dL (ref 11.2–15.7)
LYMPHOCYTES %: 22 % (ref 15.0–54.0)
MEAN CORP HGB CONC: 32.9 g/dL (ref 31.0–37.0)
MEAN CORPUSCULAR HGB: 30.8 pg (ref 26.0–34.0)
MEAN CORPUSCULAR VOL: 93.4 fl (ref 80.0–100.0)
MEAN PLATELET VOLUME: 10.2 fL (ref 8.7–12.5)
MONOCYTES %: 10 % (ref 4.0–13.0)
NRBC %: 0 % (ref 0.0–0.0)
NUCLEATED RED BLOOD CELLS: 0 /100 WC (ref 0.0–0.0)
PLATELET COUNT: 276 10*3/uL (ref 150–400)
PLATELET ESTIMATE: NORMAL
POLYMORPHONUCLEAR (SEGS) %: 59 % (ref 40.0–75.0)
RBC DISTRIBUTION WIDTH STD DEV: 46.5 fL — ABNORMAL HIGH (ref 35.1–46.3)
RED BLOOD CELL COUNT: 4.26 M/uL (ref 3.90–5.20)
WHITE BLOOD CELL COUNT: 13.4 10*3/uL — ABNORMAL HIGH (ref 4.0–11.0)

## 2023-07-21 LAB — PROTHROMBIN TIME
INR: 1.1 (ref 2.0–3.5)
PROTHROMBIN TIME: 12 SECONDS (ref 9.6–12.3)

## 2023-07-21 LAB — NT-PROBNP: NT-proBNP: 148 pg/mL — ABNORMAL HIGH (ref 0–125)

## 2023-07-21 LAB — TROPONIN T HS 1 HOUR
DELTA 1 HOUR TROPONIN T HS: 0 ng/L (ref 0–4)
TROPONIN T HS 1 HOUR RESULT: 9 ng/L (ref 0–10)

## 2023-07-21 LAB — LACTIC ACID: LACTIC ACID: 1.3 mmol/L (ref 0.4–2.0)

## 2023-07-21 LAB — LIPASE: LIPASE: 40 U/L (ref 13–60)

## 2023-07-21 LAB — TROPONIN T HS BASELINE: TROPONIN T HS BASELINE: 9 ng/L (ref 0–10)

## 2023-07-21 LAB — D-DIMER PE/DVT, QUANTITATIVE: D-DIMER PE/DVT, QUANTITATIVE: <215 ng/mLFEU (ref 0.00–499)

## 2023-07-21 LAB — TROPONIN T HS 3 HOUR
DELTA 3 HOUR TROPONIN T HS: 1 ng/L (ref 0–7)
TROPONIN T HS 3 HOUR RESULT: 8 ng/L (ref 0–10)

## 2023-07-21 LAB — EKG

## 2023-07-21 LAB — MAGNESIUM: MAGNESIUM: 1.9 mg/dL (ref 1.6–2.6)

## 2023-07-21 MED ORDER — FAMOTIDINE 20 MG PO TABS
20.00 mg | ORAL_TABLET | Freq: Once | ORAL | Status: AC
Start: 2023-07-21 — End: 2023-07-21
  Administered 2023-07-21: 20 mg via ORAL
  Filled 2023-07-21: qty 1

## 2023-07-21 MED ORDER — ALUMINUM-MAGNESIUM-SIMETHICONE 200-200-20 MG/5ML PO SUSP
10.00 mL | Freq: Four times a day (QID) | ORAL | 0 refills | Status: AC | PRN
Start: 2023-07-21 — End: 2023-07-24

## 2023-07-21 MED ORDER — IPRATROPIUM-ALBUTEROL 0.5-2.5 (3) MG/3ML IN SOLN
3.00 mL | Freq: Once | RESPIRATORY_TRACT | Status: AC
Start: 2023-07-21 — End: 2023-07-21
  Administered 2023-07-21: 3 mL via RESPIRATORY_TRACT
  Filled 2023-07-21: qty 3

## 2023-07-21 MED ORDER — NITROGLYCERIN 0.4 MG SL SUBL
0.4000 mg | SUBLINGUAL_TABLET | Freq: Once | SUBLINGUAL | Status: AC
  Administered 2023-07-21: 0.4 mg via SUBLINGUAL
  Filled 2023-07-21: qty 1

## 2023-07-21 MED ORDER — METOPROLOL TARTRATE 5 MG/5ML IV SOLN
5.0000 mg | Freq: Once | INTRAVENOUS | Status: DC
Start: 2023-07-21 — End: 2023-07-21
  Filled 2023-07-21: qty 5

## 2023-07-21 MED ORDER — LIDOCAINE VISCOUS HCL 2 % MT SOLN
10.00 mL | Freq: Once | OROMUCOSAL | Status: AC
Start: 2023-07-21 — End: 2023-07-21
  Administered 2023-07-21: 10 mL via ORAL
  Filled 2023-07-21: qty 15

## 2023-07-21 MED ORDER — ASPIRIN 81 MG PO CHEW
324.00 mg | CHEWABLE_TABLET | Freq: Once | ORAL | Status: AC
Start: 2023-07-21 — End: 2023-07-21
  Administered 2023-07-21: 324 mg via ORAL
  Filled 2023-07-21: qty 4

## 2023-07-21 MED ORDER — SODIUM CHLORIDE 0.9 % IV BOLUS
500.0000 mL | Freq: Once | INTRAVENOUS | Status: AC
  Administered 2023-07-21: 500 mL via INTRAVENOUS

## 2023-07-21 MED ORDER — FENTANYL CITRATE 0.05 MG/ML IJ SOLN
25.00 ug | Freq: Once | INTRAMUSCULAR | Status: AC
Start: 2023-07-21 — End: 2023-07-21
  Administered 2023-07-21: 25 ug via INTRAVENOUS
  Filled 2023-07-21: qty 2

## 2023-07-21 MED ORDER — ALUMINUM & MAGNESIUM HYDROXIDE 200-200 MG/5ML PO SUSP
30.00 mL | Freq: Once | ORAL | Status: AC
Start: 2023-07-21 — End: 2023-07-21
  Administered 2023-07-21: 30 mL via ORAL
  Filled 2023-07-21: qty 30

## 2023-07-21 NOTE — Narrator Note (Signed)
Patient Disposition  Patient education for diagnosis, medications, activity, diet and follow-up.  Patient left ED 7:06 AM.  Patient rep received written instructions.    Interpreter to provide instructions: No    Patient belongings with patient: YES    Have all existing LDAs been addressed? Yes    Have all IV infusions been stopped? Yes    Destination: Discharged to home    Patient provided with discharge instructions, teaching, Rx, and follow up care.  Patient verbalizes understanding of dc.  Pt skin warm, dry appropriate for ethnicity.  In no apparent distress, speaking in full sentences. Patient ambulates out of ED with steady gait.

## 2023-07-21 NOTE — Discharge Instructions (Signed)
Please continue to take your famotidine and omeprazole as prescribed.    You can take Mylanta every 6 hours as needed for pain control as well.

## 2023-07-21 NOTE — Narrator Note (Signed)
Patient complains of lower abdominal pain 6/10 at this time, denies constipation and diarrhea.  Last bowel movement was early Saturday.  Bowel sounds are present in all 4 quadrants

## 2023-07-21 NOTE — ED Provider Notes (Signed)
[facility] EMERGENCY MEDICINE ATTENDING NOTE     The patient was seen primarily by me. ED nursing record was reviewed. [facility] prior records as available electronically through the Epic record were reviewed.    ED RN triage:   Triage Documentation       Zebar, Rym, RN 07/21/2023 01:04                 Patient presents via ambulance, c/o of sharp non radiating epigastric pain and difficulty breathing that woke her up at midnight. Patient was taking famotidine  at home for heart burn, has history of DM, denies other illness.  EMD put patient on 2L NC prior coming to the ED as her O2 was 90%.  Patient is tachycardic and speaks in short sentences in triage.           History of Present Illness:    Samantha Cameron is a 67 year old female with with past medical history significant for acid reflux, COPD, type 2 diabetes, paroxysmal SVT (abnormal nuclear stress test in 2021) presents to the emergency department by way of EMS complaining of 30 minutes of epigastric abdominal and chest pain that came on gradually while at rest.  She also says that she is feeling short of breath.    Current medications/prescriptions reviewed.     Samantha Cameron   MRN: [id number]  PCP: Samantha Cameron., MD    Arrived to the ED by: [facility]    History provided by: The Patient             Physical Examination and Testing     ED Triage Vitals   ED Triage Vitals Brief Group      Temp 07/21/23 0104 98.2 F      Pulse 07/21/23 0104 (!) 121      Resp 07/21/23 0104 (!) 21      BP 07/21/23 0104 111/74      SpO2 07/21/23 0104 (!) 91 %      Pain Score 07/21/23 0116 8       No results found.  Labs Reviewed   CBC, PLATELET & DIFFERENTIAL - Abnormal; Notable for the following components:       Result Value    WHITE BLOOD CELL COUNT 13.4 (*)     RBC DISTRIBUTION WIDTH STD DEV 46.5 (*)     LARGE PLATELET PRESENT (*)     GIANT PLATELET PRESENT (*)     POIKILOCYTOSIS MODERATE (*)     TARGET CELLS SLIGHT (*)     BURR CELLS SLIGHT (*)     TEARDROPS SLIGHT (*)      All other components within normal limits    Narrative:     Current Anticoagulant->None  Is this D-Dimer needed to rule out Pulmonary Embolism or  DVT?->Yes   COMPREHENSIVE METABOLIC PANEL - Abnormal; Notable for the following components:    TOTAL PROTEIN 6.3 (*)     All other components within normal limits    Narrative:     Tests added: LIPASE by Katurah Karapetian,JOSPEH DO on 07/21/23 at 0208  by [id number].   NT-PROBNP - Abnormal; Notable for the following components:    NT-proBNP 148 (*)     All other components within normal limits    Narrative:     Tests added: LIPASE by Keylin Podolsky,JOSPEH DO on 07/21/23 at 0208  by [id number].   MAGNESIUM    Narrative:     Tests added: LIPASE by Jakeia Carreras,JOSPEH DO on 07/21/23 at 0208  by [id number].   LACTIC ACID    Narrative:     Emergency RN: By default, ED lactic acid orders are  repeated 3 hours later. If the initial value is n  ormal, check with the provider as the repeat may be  canceled.   PROTHROMBIN TIME    Narrative:     Current Anticoagulant->None  Is this D-Dimer needed to rule out Pulmonary Embolism or  DVT?->Yes   D-DIMER PE/DVT, QUANTITATIVE    Narrative:     Current Anticoagulant->None  Is this D-Dimer needed to rule out Pulmonary Embolism or  DVT?->Yes   TROPONIN T HS BASELINE   TROPONIN T HS 1 HOUR   LAB ADD ON/WRITE IN TEST   LIPASE    Narrative:     Tests added: LIPASE by Kerissa Coia,JOSPEH DO on 07/21/23 at 0208  by [id number].   TROPONIN T HS 3 HOUR        Physical Exam (ED Bed 04/04-A):   Patient Vitals for the past 999 hrs:   BP Temp Pulse Resp SpO2 Weight   07/21/23 0248 -- -- 91 14 93 % --   07/21/23 0237 -- -- 105 14 95 % --   07/21/23 0228 109/70 -- 108 16 97 % --   07/21/23 0216 101/75 -- 112 16 96 % --   07/21/23 0146 110/74 -- 111 18 96 % --   07/21/23 0130 100/73 -- 117 17 93 % --   07/21/23 0127 95/68 -- (!) 128 (!) 32 92 % --   07/21/23 0120 108/71 -- -- -- -- --   07/21/23 0116 108/71 -- 118 18 -- --   07/21/23 0104 111/74 98.2 F (!) 121 (!) 21 (!) 91 % 99.8 kg (220 lb 0.3 oz)      GENERAL:  WDWN, no acute distress, non-toxic   SKIN:  Warm & Dry, no rash, no petechiae or purpurae.  HEAD:  NCAT. Sclerae are anicteric and aninjected, oropharynx is clear with moist mucous membranes.  LUNGS:  Clear to auscultation bilaterally. No wheezes, rales, rhonchi.   HEART: Tachycardic.  No murmurs, rubs, or gallops.   ABDOMEN:  Soft, mild epigastric tenderness to deep palpation.  No guarding or rebound tenderness or rigidity.  EXTREMITIES:  No obvious deformities. No cyanosis. No edema.  NEUROLOGIC:  Alert; moves all extremities; speaking in clear fluent sentences.        ED Course and Medical Decision Making   Medications Given in the ED:    Medications   metoprolol (LOPRESSOR) injection 5 mg (0 mg Intravenous Held 07/21/23 0225)   fentaNYL (SUBLIMAZE) injection 25 mcg (25 mcg Intravenous Given 07/21/23 0116)   nitroGLYCERIN (NITROSTAT) SL tablet 0.4 mg (0.4 mg Sublingual Given 07/21/23 0120)   aspirin chewable tablet 324 mg (324 mg Oral Given 07/21/23 0116)   sodium chloride 0.9 % IV bolus 500 mL (0 mLs Intravenous Stopped 07/21/23 0249)   lidocaine (XYLOCAINE) 2 % viscous solution 10 mL (10 mLs Oral Given 07/21/23 0207)     And   aluminum-magnesium hydroxide (MAALOX) 200-200 mg/5 mL suspension 30 mL (30 mLs Oral Given 07/21/23 0207)   famotidine (PEPCID) tablet 20 mg (20 mg Oral Given 07/21/23 0207)     ED COURSE      Treatment significantly limited by the following social determinants of health:  None     In summary, this is a 67 year old female who presents to the emergency department complaining of epigastric abdominal pain and chest pain.  Differential  diagnosis includes but is not limited to GERD, acid reflux, ACS, and pancreatitis.  The patient's symptoms drastically improved after GI cocktail.   See ED course below:    ED Course as of 07/21/23 0505   Sun Jul 21, 2023   0106 67 y/o F, multiple comorbidities, here in the emergency department with half an hour of epigastric discomfort and palpitations.   0108  EKG: Sinus tachycardia, rate 113, no ST segment elevation reciprocal depressions. Incomplete RBB, No acute ischemic changes as interpreted by myself.   0501 Troponin T HS 3 Hour  Troponins are stable/not elevated.  Patient is requesting discharge and feels better after GI cocktail.           Patient educated on their likely diagnosis and follow up care plan. Reasons to return to the Emergency Department were reviewed.    Disposition     Impression(s):  Gastroesophageal reflux disease, unspecified whether esophagitis present    Condition: Stable    Disposition:  Discharge    Discharge Prescriptions:      Medication List        START taking these medications      aluminum-magnesium hydroxide-simethicone 200-200-20 MG/5ML Susp  Commonly known as: MYLANTA II  Take 10 mLs by mouth every 6 (six) hours as needed for Indigestion  for up to 3 days            ASK your doctor about these medications      albuterol HFA 108 (90 Base) MCG/ACT inhaler  Inhale 2 puffs into the lungs every 6 (six) hours as needed for Wheezing     aspirin 81 MG EC tablet  Take 1 tablet by mouth in the morning.     atorvastatin 40 MG tablet  Commonly known as: LIPITOR  Take 1 tablet by mouth at bedtime     B-Complex/Vitamin C Tabs  Take 1 each by mouth in the morning.     cholecalciferol 1000 UNIT tablet  Commonly known as: D3-1000  Take 1 tablet by mouth in the morning.     cyclobenzaprine 5 MG tablet  Commonly known as: FLEXERIL     famotidine 20 MG tablet  Commonly known as: PEPCID  Take 1 tablet by mouth in the morning and 1 tablet before bedtime.     fluticasone 50 MCG/ACT nasal spray  Commonly known as: FLONASE  1 spray by Each Nostril route in the morning.     glucose blood test strip  Use once daily as directed. Dispense test strips covered by insurance - using freestyle lite. Dx e11.29     Lancets  Use 1 time daily.  Dispense lancets that are covered by insurance. ICD Code e11.29     metFORMIN 500 MG 24 hr tablet  Commonly known as:  GLUCOPHAGE-XR  Take 2 tablets by mouth daily with breakfast     metoprolol 25 MG 24 hr tablet  Commonly known as: TOPROL-XL  Take 1 tablet by mouth in the morning.     omeprazole 20 MG capsule  Commonly known as: PriLOSEC  Take 1 capsule by mouth in the morning.     oxyCODONE-acetaminophen 5-325 MG per tablet  Commonly known as: PERCOCET     SUMAtriptan 50 MG tablet  Commonly known as: IMITREX  Take 1 tablet by mouth as needed for Migraine (can take 2nd dose in 2 hours if not resolved, no more than 2 tabs in 24 hours)  Where to Get Your Medications        You can get these medications from any pharmacy    Bring a paper prescription for each of these medications  aluminum-magnesium hydroxide-simethicone 200-200-20 MG/5ML Susp         Signed by Dr. Deanna Artis DO MPH  Emergency Medicine Attending  [facility]    The patient was seen primarily by me.  Nursing record was reviewed. [facility] prior records, as available electronically through the Epic record, were reviewed. Please see electronic record for additional detail about labs, imaging, and diagnostic results. This patient encounter note was created using voice-recognition software. Please excuse any typographical errors that have not yet been reviewed and corrected.     Note to patient: The 21st Century Cures Act makes medical notes available to patients in the interest of transparency. Be advised this is a medical document. It is intended primarily as Corporate investment banker. It is written in medical language and may contain abbreviations or verbiage that are unfamiliar. It may appear blunt or direct. This is because medical documents are intended to carry relevant information, facts as evident, and the clinical opinion of the physician only as formulated at the time of writing.

## 2023-07-21 NOTE — ED Notes (Signed)
Bed: 04-A  Expected date:   Expected time:   Means of arrival:   Comments:  EMS: 15F epigastric pain

## 2023-07-21 NOTE — Narrator Note (Signed)
Patient is on room air at this time.

## 2023-07-21 NOTE — Narrator Note (Signed)
Patient reports pain improvement.

## 2023-07-21 NOTE — ED Triage Note (Signed)
Patient presents via ambulance, c/o of sharp non radiating epigastric pain and difficulty breathing that woke her up at midnight. Patient was taking famotidine  at home for heart burn, has history of DM, denies other illness.  EMD put patient on 2L NC prior coming to the ED as her O2 was 90%.  Patient is tachycardic and speaks in short sentences in triage.

## 2023-07-23 ENCOUNTER — Telehealth (HOSPITAL_BASED_OUTPATIENT_CLINIC_OR_DEPARTMENT_OTHER): Payer: Self-pay
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
## 2023-07-23 NOTE — Telephone Encounter (Addendum)
Patient identification verified by 2 forms.    Call placed to patient she reports ongoing GERD symptoms.   Was seen on 07/21/23, advised to follow up with provider.   Denied nausea and or vomiting.   Stomach still has a burning pain.   No blood in stool.   One day stool did look dark, she had to push to go to the bathroom.   No fever/chills.   Mylanta helped only a little, did buy some gas ex.   Has pain in middle of chest at times.   Scheduled with PA Baird Lyons 07/25/23 @ 10:20 AM.   [name] any worsening sx to present to ED.   Patient verbalized understanding and agreed with plan.      ----- Message from Johnella Moloney sent at 07/23/2023  3:49 PM EDT -----  Hi:    Would you be able to assist with this follow up.    Thanks,  Carolynne Edouard, RN  ----- Message -----  From: Esperanza Sheets, RN  Sent: 07/23/2023  12:05 PM EDT  To: [facility]    Ms Motola was seen in the ED on 07/21/23 with GERD.  She is still extremely uncomfortable despite a mild diet (BRAT), mylanta and GasX.  She doesn't have a PCP appt scheduled until November but would like to be seen ASAP because of the ongoing discomfort. Can you please reach out to her to determine whether she needs to be seen today?  Thank you  Esperanza Sheets RN, BSN  RN Care Manager  [phone]

## 2023-07-23 NOTE — Progress Notes (Signed)
CCM IP/ED Discharge Follow-up Note    Purpose of contact: Follow-up post discharge from ED. Pt was admitted for GERD at [facility] and discharged on 07/21/2023.    Patient contacted via phone.  Discharge Questionnaire Outreach section completed: yes      1. Contact verbalized understanding of condition/reason for treatment as:   Other GERD    Additional education provided to patient on discharge diagnosis? No  Contact reports current condition post discharge: Worsened pt states GERD not improved despite mild diet, taking Mylanta as directed as well as GasX prn    2. Discharge medications review completed? Yes Comments: none  A. Patient/contact has access to all discharge medications? Yes   B. Patient/contact reports understanding of discharge medications and ability to follow medication plan? Yes   C. Additional education or instructions given: None  D. Follow up needed: None  E. Comments about Discharge medication review: None      3. Discharge instructions reviewed? Yes  A. Patient/contact understands signs/symptoms to report/return for care? Yes   B. Patient/contact understands emergent and non-emergent contacts to report questions or concerns? Yes  C. Are [facility]NA or other home supports part of the discharge plan? No. Are these services currently active? N/A  Contact made with service providers:None  D. Barriers to discharge plan:None  E.Additional instructions or concerns discussed: ongoing sx of GERD  F. Additional comments about discharge instructions: None    4. Follow-up  A. Follow-up appointments reviewed? No  B. Additional services or appointments arranged: Other: Urgent message sent ot [facility] regarding ongoing sx  C. Comments about follow-up: None    5. Assessment  CM and patient/contact's assessment of factors (root causes) leading to admission:New onset of disease or acute condition. Comments: Pt reports O2 desat when she was in ED. Does not have ED f/u scheduled with PCP or GI    Concerns for the  primary care team: Ongoing GERD despite meds    6. CM Care Plan reviewed? No  Additional CM interventions planned? Referral to resource: [facility] .   Changes made to care plan: No    Self-management next steps agreed to by patient/family: Pt will wait for call from [facility] and scheduled appt as recommended    Next Steps/Plan: follow up on appt scheduled  Planned date of Next Contact: before the end of this week

## 2023-07-25 ENCOUNTER — Telehealth (HOSPITAL_BASED_OUTPATIENT_CLINIC_OR_DEPARTMENT_OTHER): Payer: Self-pay

## 2023-07-25 ENCOUNTER — Encounter (HOSPITAL_BASED_OUTPATIENT_CLINIC_OR_DEPARTMENT_OTHER): Payer: Self-pay

## 2023-07-25 ENCOUNTER — Other Ambulatory Visit: Payer: Self-pay

## 2023-07-25 ENCOUNTER — Ambulatory Visit: Payer: Medicare Other | Attending: Internal Medicine | Admitting: Family Medicine

## 2023-07-25 VITALS — BP 112/77 | HR 90 | Temp 98.3°F | Wt 219.4 lb

## 2023-07-25 DIAGNOSIS — K219 Gastro-esophageal reflux disease without esophagitis: Secondary | ICD-10-CM | POA: Diagnosis present

## 2023-07-25 DIAGNOSIS — F17200 Nicotine dependence, unspecified, uncomplicated: Secondary | ICD-10-CM | POA: Insufficient documentation

## 2023-07-25 DIAGNOSIS — Z7189 Other specified counseling: Secondary | ICD-10-CM | POA: Diagnosis present

## 2023-07-25 DIAGNOSIS — J4489 Other specified chronic obstructive pulmonary disease: Secondary | ICD-10-CM | POA: Diagnosis present

## 2023-07-25 DIAGNOSIS — R051 Acute cough: Secondary | ICD-10-CM | POA: Diagnosis present

## 2023-07-25 MED ORDER — FLUTICASONE PROPIONATE 50 MCG/ACT NA SUSP
1.00 | Freq: Every day | NASAL | 0 refills | Status: AC
Start: 2023-07-25 — End: 2023-08-24

## 2023-07-25 MED ORDER — NICOTINE POLACRILEX 4 MG MT LOZG
4.0000 mg | LOZENGE | OROMUCOSAL | 1 refills | Status: AC | PRN
Start: 2023-07-25 — End: 2023-08-24

## 2023-07-25 MED ORDER — GUAIFENESIN ER 600 MG PO TB12
1200.00 mg | ORAL_TABLET | Freq: Two times a day (BID) | ORAL | 0 refills | Status: AC
Start: 2023-07-25 — End: 2023-08-04

## 2023-07-25 NOTE — Patient Instructions (Signed)
Do not chew or swallow the lozenges; allow to dissolve slowly (~20 to 30 minutes); minimize swallowing and occasionally move lozenge from one side of the mouth to the other until completely dissolved.   Do not eat or drink 15 minutes before using or while lozenge is in mouth.    You can use up to 20 lozenges per day.

## 2023-07-26 ENCOUNTER — Telehealth (HOSPITAL_BASED_OUTPATIENT_CLINIC_OR_DEPARTMENT_OTHER): Payer: Self-pay

## 2023-07-26 ENCOUNTER — Encounter (HOSPITAL_BASED_OUTPATIENT_CLINIC_OR_DEPARTMENT_OTHER): Payer: Self-pay

## 2023-07-26 NOTE — Progress Notes (Signed)
CCM Encounter    Primary Purpose of Contact: Appointment reminder or follow-up and Referral to resources and/or follow-up    Individuals involved: Patient    Summary of discussion and interventions:   Pt reports feeling better.  Has begun using Flonase, Mucinex and Nicotine lozenges. Decreased the number of cigarettes she has smoked in the past and hoping to stop completely using the Nicotine lozenges.  Does not use her CPAP and has not in years but is scheduled for a home sleep study on 07/30/23.  States she is waiting for a call to schedule a "camera endoscopy". Pt was contacted by [facility] and is waiting to receive the brochure in the  mail. She will then set up in intake appointment to discuss services.      CM Goals addressed: Motivation/self-efficacy  and Care coord     Education provided to patient/family:  Provided pt with phone number to schedule her endoscopy [phone] and encouraged her to call proactively.  Confirmed pt will be able to drive herself to pick up and return the sleep study equipment.    Referrals made or reviewed: None    Assessment and Plan:  Improved since ED visit with new meds.  Attempting to quit smoking completely.  Aware of all follow up appts    Progress to date toward CM Goals: Good  New barriers or goals identified:  New Goal-Complete at home sleep study and outpatient endoscopy    Care plan revised or reviewed: Yes, Patient/family in agreement with CM plan: Yes     Self-management next steps agreed to by patient/family: as above    Care management next steps: as above    Planned date of next contact: week of 08/05/23

## 2023-07-30 ENCOUNTER — Ambulatory Visit (HOSPITAL_BASED_OUTPATIENT_CLINIC_OR_DEPARTMENT_OTHER): Payer: Medicare Other | Attending: Cardiovascular Disease

## 2023-07-30 ENCOUNTER — Ambulatory Visit
Admission: RE | Admit: 2023-07-30 | Discharge: 2023-07-30 | Disposition: A | Discharge status: Home / Self Care | Payer: Medicare Other | Attending: Student in an Organized Health Care Education/Training Program | Admitting: Student in an Organized Health Care Education/Training Program

## 2023-07-30 ENCOUNTER — Other Ambulatory Visit: Payer: Self-pay

## 2023-07-30 ENCOUNTER — Other Ambulatory Visit (HOSPITAL_BASED_OUTPATIENT_CLINIC_OR_DEPARTMENT_OTHER): Payer: Self-pay

## 2023-07-30 DIAGNOSIS — M4726 Other spondylosis with radiculopathy, lumbar region: Secondary | ICD-10-CM | POA: Diagnosis present

## 2023-07-30 DIAGNOSIS — M5416 Radiculopathy, lumbar region: Secondary | ICD-10-CM

## 2023-07-30 DIAGNOSIS — G4733 Obstructive sleep apnea (adult) (pediatric): Secondary | ICD-10-CM
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
## 2023-08-05 ENCOUNTER — Other Ambulatory Visit: Payer: Self-pay

## 2023-08-05 ENCOUNTER — Encounter (HOSPITAL_BASED_OUTPATIENT_CLINIC_OR_DEPARTMENT_OTHER): Payer: Self-pay | Admitting: Family Medicine

## 2023-08-05 ENCOUNTER — Ambulatory Visit: Payer: Medicare Other | Attending: Internal Medicine | Admitting: Family Medicine

## 2023-08-05 VITALS — BP 110/68 | HR 94 | Temp 97.4°F | Wt 215.4 lb

## 2023-08-05 DIAGNOSIS — R195 Other fecal abnormalities: Secondary | ICD-10-CM | POA: Insufficient documentation

## 2023-08-05 DIAGNOSIS — G4733 Obstructive sleep apnea (adult) (pediatric): Secondary | ICD-10-CM | POA: Diagnosis present

## 2023-08-05 DIAGNOSIS — Z23 Encounter for immunization: Secondary | ICD-10-CM | POA: Diagnosis present

## 2023-08-05 DIAGNOSIS — R109 Unspecified abdominal pain: Secondary | ICD-10-CM | POA: Diagnosis present

## 2023-08-05 LAB — CBC WITH PLATELET
ABSOLUTE NRBC COUNT: 0 10*3/uL (ref 0.0–0.0)
HEMATOCRIT: 45.6 % — ABNORMAL HIGH (ref 34.1–44.9)
HEMOGLOBIN: 14.4 g/dL (ref 11.2–15.7)
MEAN CORP HGB CONC: 31.6 g/dL (ref 31.0–37.0)
MEAN CORPUSCULAR HGB: 30.3 pg (ref 26.0–34.0)
MEAN CORPUSCULAR VOL: 95.8 fl (ref 80.0–100.0)
MEAN PLATELET VOLUME: 11.3 fL (ref 8.7–12.5)
NRBC %: 0 % (ref 0.0–0.0)
PLATELET COUNT: 284 10*3/uL (ref 150–400)
RBC DISTRIBUTION WIDTH STD DEV: 47.3 fL — ABNORMAL HIGH (ref 35.1–46.3)
RED BLOOD CELL COUNT: 4.76 M/uL (ref 3.90–5.20)
WHITE BLOOD CELL COUNT: 11.2 10*3/uL — ABNORMAL HIGH (ref 4.0–11.0)

## 2023-08-05 LAB — COMPREHENSIVE METABOLIC PANEL
ALANINE AMINOTRANSFERASE: 23 U/L (ref 12–45)
ALBUMIN: 4.6 g/dL (ref 3.4–5.2)
ALKALINE PHOSPHATASE: 96 U/L (ref 45–117)
ANION GAP: 12 mmol/L (ref 10–22)
ASPARTATE AMINOTRANSFERASE: 23 U/L (ref 8–34)
BILIRUBIN TOTAL: 0.2 mg/dL (ref 0.2–1.0)
BUN (UREA NITROGEN): 11 mg/dL (ref 7–18)
CALCIUM: 10 mg/dL (ref 8.5–10.5)
CARBON DIOXIDE: 25 mmol/L (ref 21–32)
CHLORIDE: 106 mmol/L (ref 98–107)
CREATININE: 0.7 mg/dL (ref 0.4–1.2)
ESTIMATED GLOMERULAR FILT RATE: >60 mL/min (ref >60)
Glucose Random: 117 mg/dL (ref 74–160)
POTASSIUM: 4.1 mmol/L (ref 3.5–5.1)
SODIUM: 143 mmol/L (ref 136–145)
TOTAL PROTEIN: 7.1 g/dL (ref 6.4–8.2)

## 2023-08-05 LAB — LIPASE: LIPASE: 62 U/L — ABNORMAL HIGH (ref 13–60)

## 2023-08-05 NOTE — Progress Notes (Signed)
08/05/2023  VIS given prior to administration and reviewed with the patient and or legal guardian. Patient/legal guardian understands the disease and the vaccine. See immunization/Injection module or chart review for date of publication and additional information.  June Vacha A Kemora Pinard, RN

## 2023-08-05 NOTE — Assessment & Plan Note (Signed)
Currently on famotidine 20 mg daily and omeprazole 20 mg daily. Was referred for EDG - scheduled on 01/24/2024

## 2023-08-05 NOTE — Assessment & Plan Note (Addendum)
completed home sleep study on 07/30/2023, results have not yet returned.

## 2023-08-06 ENCOUNTER — Other Ambulatory Visit (HOSPITAL_BASED_OUTPATIENT_CLINIC_OR_DEPARTMENT_OTHER): Payer: Self-pay | Admitting: Family Medicine

## 2023-08-06 ENCOUNTER — Telehealth (HOSPITAL_BASED_OUTPATIENT_CLINIC_OR_DEPARTMENT_OTHER): Payer: Self-pay

## 2023-08-06 DIAGNOSIS — R748 Abnormal levels of other serum enzymes: Secondary | ICD-10-CM

## 2023-08-06 DIAGNOSIS — R109 Unspecified abdominal pain: Secondary | ICD-10-CM

## 2023-08-06 NOTE — Telephone Encounter (Addendum)
Called and spoke with patient, advised her of message below, she verbalized understanding,denies any allergies to IV contrast. Provided radiology scheduling number to schedule CT.      ----- Message from Maricela Bo sent at 08/06/2023 12:30 PM EDT -----  Dear RN,     Please:    1. Create Telephone encounter for this patient  2. Share with the patient:    Labs show do show elevated lipase at 62.   Kidney and liver function testing was normal.   CBC show mild elevated WBC.     Plan:  1. Given lighter stools, diarrhea, worsening abd pain w/ mildly elevated lipase - I recommend patient have CT of her abdomen to assess. Please confirm no allergy to IV contrast. I have placed orders, please call Call radiology at [phone] to make appointment.     Type of Outreach: 3 phone calls and if unable to reach send letter     Document the conversation in the Telephone Encounter and send the encounter back to me to complete orders and any further necessary documentation.      Thank you,  Maricela Bo, PA-C

## 2023-08-09 ENCOUNTER — Telehealth (HOSPITAL_BASED_OUTPATIENT_CLINIC_OR_DEPARTMENT_OTHER): Payer: Self-pay

## 2023-08-09 ENCOUNTER — Other Ambulatory Visit (HOSPITAL_BASED_OUTPATIENT_CLINIC_OR_DEPARTMENT_OTHER): Payer: Self-pay | Admitting: Family Medicine

## 2023-08-09 ENCOUNTER — Other Ambulatory Visit: Payer: Self-pay

## 2023-08-09 ENCOUNTER — Ambulatory Visit
Admission: RE | Admit: 2023-08-09 | Discharge: 2023-08-09 | Disposition: A | Discharge status: Home / Self Care | Payer: Medicare Other | Attending: Diagnostic Radiology | Admitting: Diagnostic Radiology

## 2023-08-09 DIAGNOSIS — K5730 Diverticulosis of large intestine without perforation or abscess without bleeding: Secondary | ICD-10-CM | POA: Insufficient documentation

## 2023-08-09 DIAGNOSIS — R748 Abnormal levels of other serum enzymes: Secondary | ICD-10-CM | POA: Insufficient documentation

## 2023-08-09 DIAGNOSIS — R109 Unspecified abdominal pain: Secondary | ICD-10-CM

## 2023-08-09 DIAGNOSIS — K838 Other specified diseases of biliary tract: Secondary | ICD-10-CM | POA: Insufficient documentation

## 2023-08-09 DIAGNOSIS — K869 Disease of pancreas, unspecified: Secondary | ICD-10-CM | POA: Diagnosis present

## 2023-08-09 MED ORDER — IOHEXOL 350 MG/ML IV SOLN
45.00 mL | Freq: Once | INTRAVENOUS | Status: AC
Start: 2023-08-09 — End: 2023-08-09
  Administered 2023-08-09: 75 mL via INTRAVENOUS

## 2023-08-09 MED ORDER — IOHEXOL 9 MG/ML PO SOLN
500.0000 mL | ORAL | Status: DC | PRN
Start: 2023-08-09 — End: 2023-08-10

## 2023-08-09 MED ORDER — NORMAL SALINE FLUSH 0.9 % IV SOLN
10.00 mL | Freq: Once | INTRAVENOUS | Status: AC
Start: 2023-08-09 — End: 2023-08-09
  Administered 2023-08-09: 50 mL via INTRAVENOUS

## 2023-08-09 NOTE — Progress Notes (Signed)
CCM Brief Contact    Primary Purpose of Contact: Appointment reminder or follow-up and Coordination of care    Individuals involved: Patient  Pt still feeling as though she has a "burning" sensation in her stomach that goes around to her back in the area of her kidneys. Denies urinary frequency, burning or fever.  Has an abdominal/pelvic CT today at 1pm.  Completed home sleep study.  Did receive the MVES packet but has not reviewed it yet as she has not had time. Is finding the nicotine lozenges very helpful. Not smoking at all    Care management next steps: Review MVES packet with pt if she requires assistance.  Confirm follow up on home sleep study    Planned date of next contact: week of 08/26/23

## 2023-08-11 ENCOUNTER — Telehealth (HOSPITAL_BASED_OUTPATIENT_CLINIC_OR_DEPARTMENT_OTHER): Payer: Self-pay | Admitting: Cardiovascular Disease

## 2023-08-11 ENCOUNTER — Encounter (HOSPITAL_BASED_OUTPATIENT_CLINIC_OR_DEPARTMENT_OTHER): Payer: Self-pay | Admitting: Critical Care Medicine

## 2023-08-11 ENCOUNTER — Other Ambulatory Visit (HOSPITAL_BASED_OUTPATIENT_CLINIC_OR_DEPARTMENT_OTHER): Payer: Self-pay | Admitting: Critical Care Medicine

## 2023-08-11 DIAGNOSIS — G4733 Obstructive sleep apnea (adult) (pediatric): Secondary | ICD-10-CM

## 2023-08-11 NOTE — Telephone Encounter (Signed)
Pls let pt know that sleep study shows moderate sleep spanea and that her oxygen dips at night. I am recommending she had an appointment with a sleep doctor and put in a referral for this. They have done an order foor another study called cpap titration so they can set her up with the cpap machine  Thanks  Alcario Drought

## 2023-08-11 NOTE — Progress Notes (Signed)
Sleep Study Findings:  *Please see below regarding communication of results to patient, next steps, and other helpful information.      Recent sleep study demonstrated: moderate sleep apnea with most of the night with saturations less than 89%.  A CPAP titration has been ordered.  Please also consider referral to sleep medicine           Thanks to all.  Ordering provider cc'd here.        *The results of this patient's recent sleep study are now available in the Cardio/Pulm/Neuro tab (in-lab studies) or will soon be available scanned under the Media tab (home sleep studies).  The major findings are summarized above. The full report can be reviewed with additional details.    These results have not been communicated to the patient, and are now available for the ordering physician to go over with the patient and discuss management.  The sleep team is unable to review the results of the study with every patient unless they are already followed in sleep clinic. Sleep lab will assist with booking studies and facilitating CPAP equipment referrals, however lab is unable to address questions about report findings. These should be discussed with patient by ordering team; patient can also be offered a sleep clinic appointment for additional input.     If OSA is diagnosed, the sleep lab will help facilitate a CPAP titration study if needed (eg could not be completed on diagnostic night due to time constraints). Once insurance approval is obtained for the CPAP study, the sleep lab will call the patient to schedule. Patients may also call the sleep lab at [phone] to schedule the CPAP titration study if desired.  Some insurance plans may not approve a CPAP titration study, in which case autoset CPAP device is typically approved by insurance. Sleep team will assist with this prescription if needed.    If CPAP has already been titrated (eg split night study or CPAP titration study), sleep team will order CPAP equipment to  prevent delays in starting therapy. This typically takes a few weeks to arrange. Patients should be counseled to expect a call from a DME company to set up equipment at their home.    Please note that some health plans may not cover CPAP therapy ([employer], [employer])--patients will be offered self-pay for refurbished machine. Alternatives (such as basic machine through a non-profit foundation) may be available--if desired please refer patient to sleep clinic to discuss how to obtain this.  Medicare patients require a face-to-face visit between 31-90 days after CPAP set up to document use and benefit, in order for ongoing insurance coverage of therapy.     For patients diagnosed with OSA, a sleep clinic appointment will be automatically attempted to be scheduled after CPAP equipment is set-up, in an effort to ensure necessary documentation for ongoing CPAP therapy is completed.  Mask clinic is available at [facility] if needed for mask fitting. Please contact sleep lab Epic pool to ask about this.    If additional input is desired earlier in the process, sleep consultation can be obtained at any time through a referral to sleep clinic, and questions about the process can be directed to the Sleep Lab EPIC message pool.  Thanks to all.

## 2023-08-12 ENCOUNTER — Telehealth (HOSPITAL_BASED_OUTPATIENT_CLINIC_OR_DEPARTMENT_OTHER): Payer: Self-pay | Admitting: Ambulatory Care

## 2023-08-12 ENCOUNTER — Telehealth (HOSPITAL_BASED_OUTPATIENT_CLINIC_OR_DEPARTMENT_OTHER): Payer: Self-pay

## 2023-08-12 ENCOUNTER — Encounter (HOSPITAL_BASED_OUTPATIENT_CLINIC_OR_DEPARTMENT_OTHER): Payer: Self-pay

## 2023-08-12 NOTE — Telephone Encounter (Addendum)
Called patient no answer, left message to recall office and or review my chart message sent.       ----- Message from Maricela Bo sent at 08/12/2023  8:42 AM EDT -----  Dear RN,     Please:    1. Create Telephone encounter for this patient  2. Share with the patient:     CT A/P w/o any inflammatory changes, no evidence of diverticulitis. Unchanged pancreatic lesions - thought to be lipomas on previous imaging.     Plan:  1. No acute finding on CT scan. Continue with bland diet, PPI + famotidine. RTC w/ new or worsening symptoms.      Type of Outreach: 3 phone calls and if unable to reach send letter     Document the conversation in the Telephone Encounter and send the encounter back to me to complete orders and any further necessary documentation.      Thank you,  Maricela Bo, PA-C

## 2023-08-12 NOTE — Telephone Encounter (Signed)
Spoke to patient re below message. Patient voiced understanding.           Pls let pt know that sleep study shows moderate sleep spanea and that her oxygen dips at night. I am recommending she had an appointment with a sleep doctor and put in a referral for this. They have done an order foor another study called cpap titration so they can set her up with the cpap machine  Thanks  Alcario Drought

## 2023-08-13 DIAGNOSIS — Z79891 Long term (current) use of opiate analgesic: Secondary | ICD-10-CM | POA: Diagnosis not present

## 2023-08-13 DIAGNOSIS — M4727 Other spondylosis with radiculopathy, lumbosacral region: Secondary | ICD-10-CM | POA: Diagnosis not present

## 2023-08-13 DIAGNOSIS — M5136 Other intervertebral disc degeneration, lumbar region: Secondary | ICD-10-CM | POA: Diagnosis not present

## 2023-08-13 NOTE — Telephone Encounter (Signed)
Patient identification verified by 2 forms.    Called placed to patient relayed message below.   Patient verbalized understanding and agreed with plan.   Denied any further questions or concerns at this time.

## 2023-08-15 ENCOUNTER — Encounter (HOSPITAL_BASED_OUTPATIENT_CLINIC_OR_DEPARTMENT_OTHER): Payer: Self-pay

## 2023-08-19 IMAGING — MR RM   CRANIO
4 of 7 series · 19 of 48 positions shown · non-contrast
Comparison: none

[Series 2: T1 · sagittal · 6.0mm · 0.47mm/px · 3 of 20 slices shown]
[im 4/20]
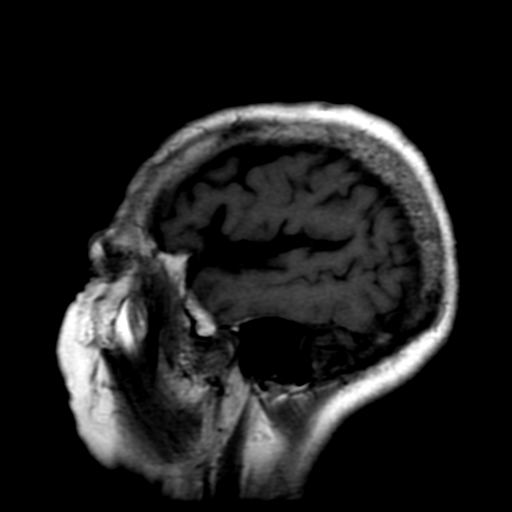
[im 12/20]
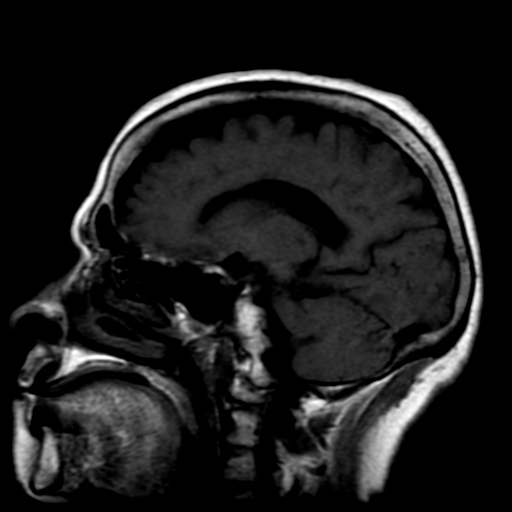
[im 20/20]
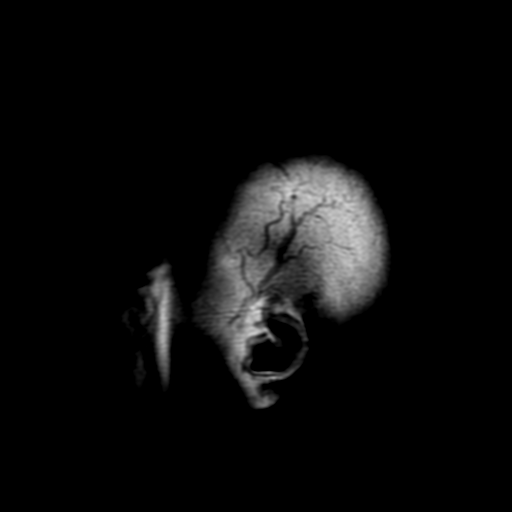

[Series 3: FLAIR · axial · 6.0mm · 0.47mm/px · z∈[-2,+120]mm · 6 of 20 slices shown]
[im 1/20]
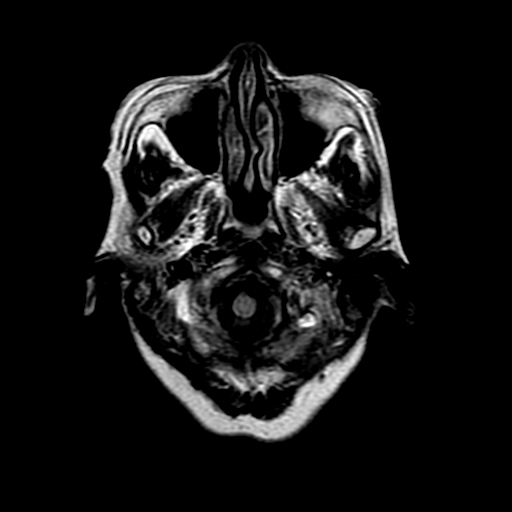
[im 4/20]
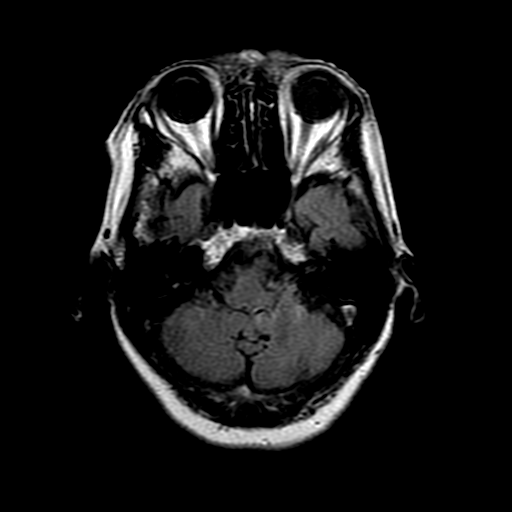
[im 8/20]
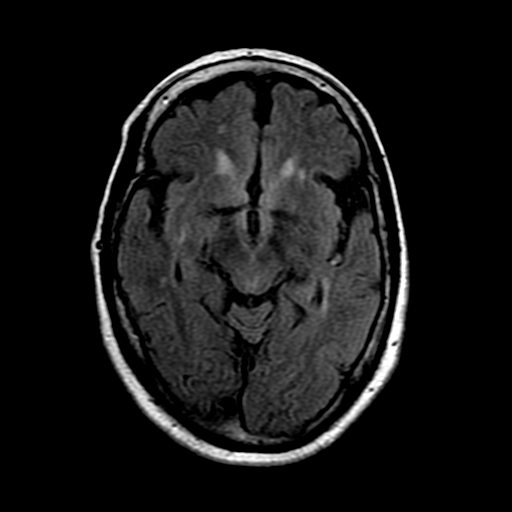
[im 12/20]
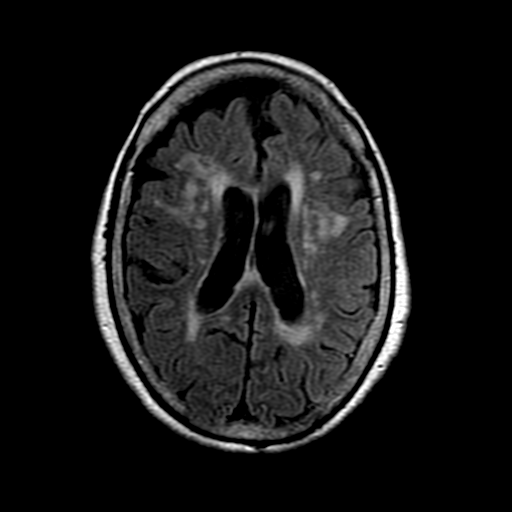
[im 16/20]
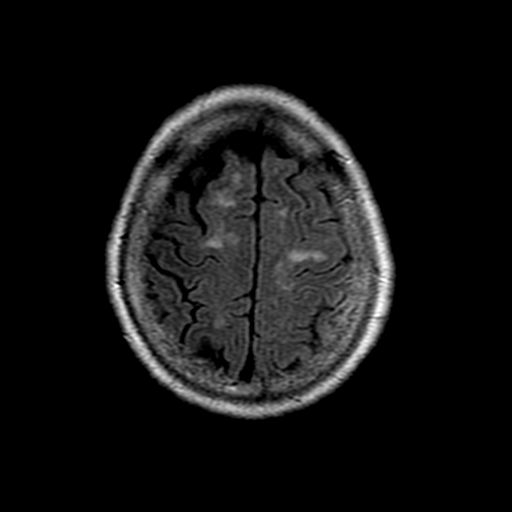
[im 20/20]
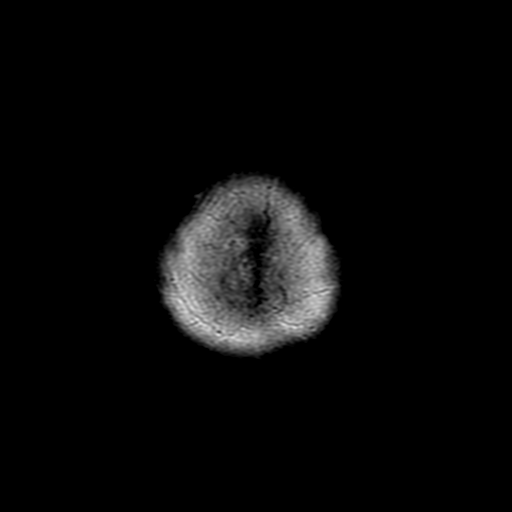

[Series 4: T2 · axial · 6.0mm · 0.47mm/px · z∈[-2,+120]mm · 6 of 20 slices shown (1 of 2)]
[im 1/20]
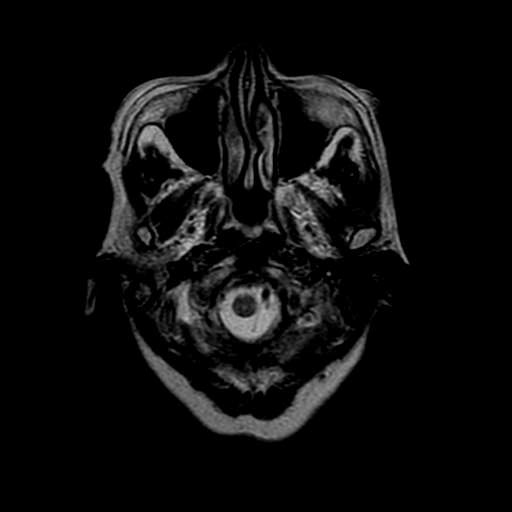
[im 4/20]
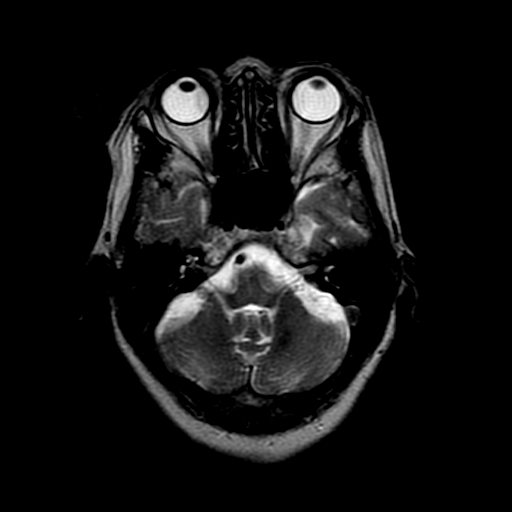
[im 8/20]
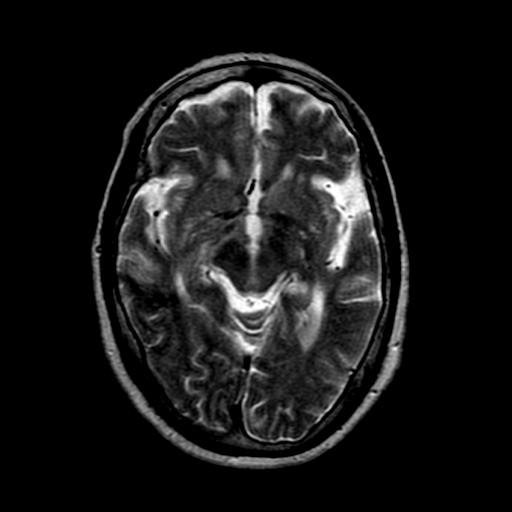
[im 12/20]
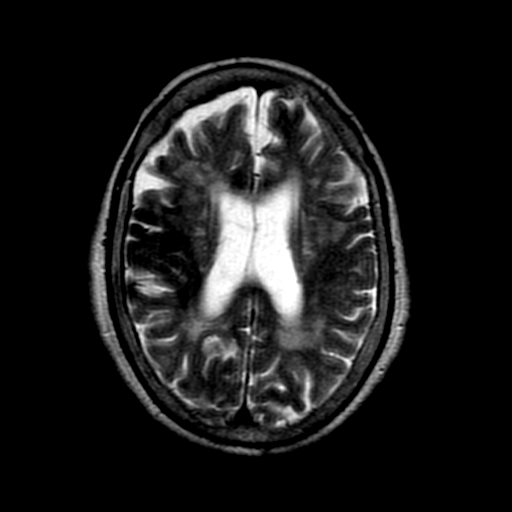
[im 16/20]
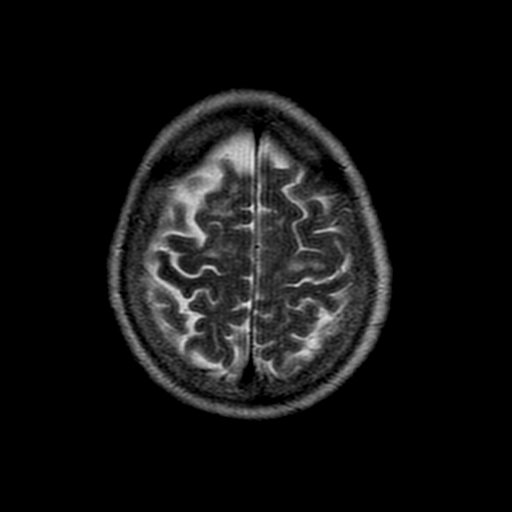
[im 20/20]
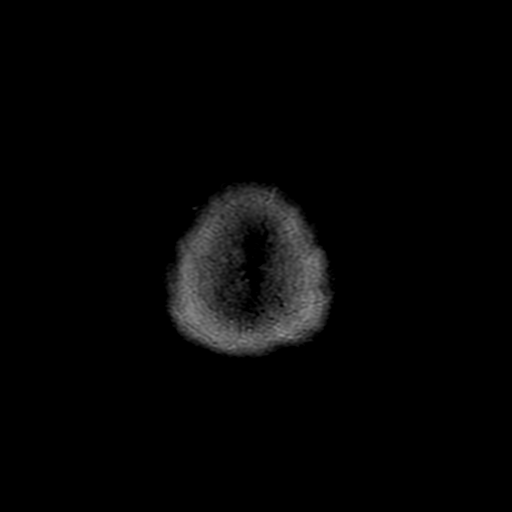

[Series 8: T2 · coronal · 6.0mm · 0.47mm/px · 4 of 20 slices shown (2 of 2)]
[im 1/20]
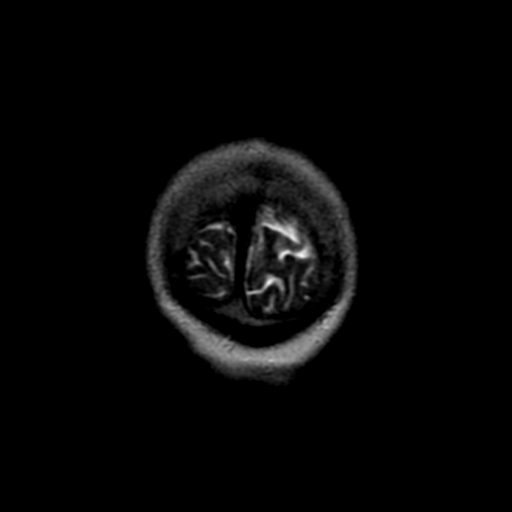
[im 4/20]
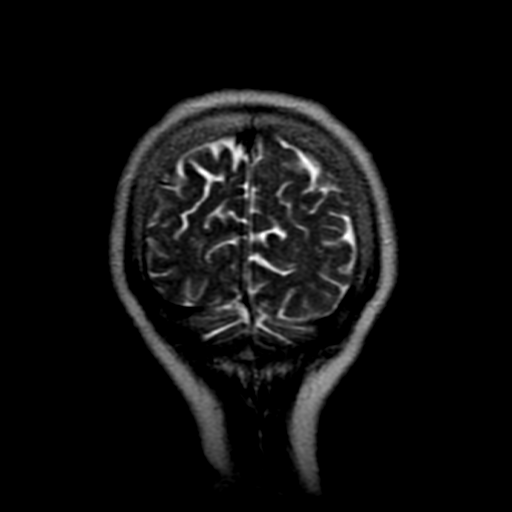
[im 12/20]
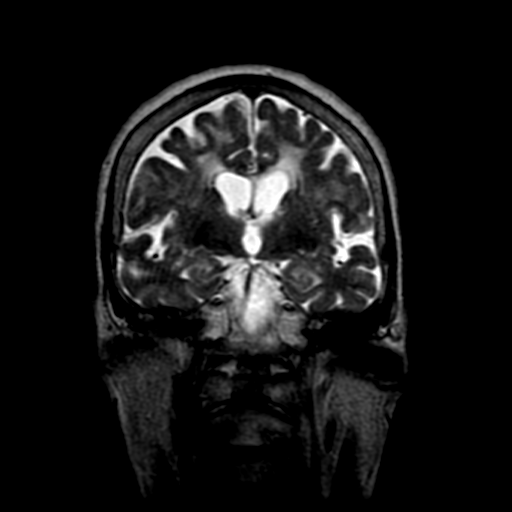
[im 20/20]
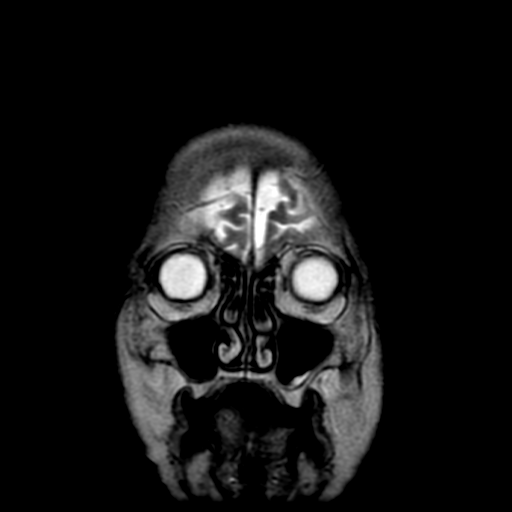

[19 of 48 positions shown; findings below may reference images not displayed]

RESSONÂNCIA MAGNÉTICA DO CRÂNIO

TÉCNICA:
Exame realizado em equipamento de ressonância magnética com sequências, ponderações e planos específicos para o segmento de interesse, sem a administração endovenosa do meio de contraste.

RESULTADO:
Redução volumétrica encefálica difusa com dilatação compensatória do espaço subaracnoideo.
Focos de hipersinal em T2 distribuídos pela substância branca supratentorial, sem determinar efeito atrófico ou expansivo significativo, sugerindo gliose por microangiopatia.
Imagens sugestivas de lacunas isquêmicas na substância branca do centro semioval / coroa radiada à direita e no tálamo ipsilateral.
O restante do parênquima cerebral tem intensidade de sinal normal.
Hipocampos com volume preservado a análise visual, notando-se alargamento das fissuras coroideias.
Ausência de hidrocefalia hipertensiva.
Foco de hipersinal na ponte à direita, podendo representar espaço perivascular dilatado / lacuna isquêmica.
Restante do tronco cerebral e cerebelo apresentando forma e intensidade de sinal normal.
Não há sinais de processo expansivo intracraniano.
Corpo caloso de morfologia e espessuras normais.
A sequência eco-planar não evidencia focos de restrição à difusão passiva de água.

CONCLUSÃO:
Redução volumétrica encefálica difusa, habitual para a faixa etária.
Provável gliose por microangiopatia na substância branca supratentorial (Kin Lon 3).
Imagens sugestivas de lacunas isquêmicas na substância branca do centro semioval / coroa radiada à direita e no tálamo ipsilateral.
Demais aspectos acima descritos. Importante correlação com dados clínicos

## 2023-08-22 ENCOUNTER — Emergency Department
Admission: EM | Admit: 2023-08-22 | Discharge: 2023-08-22 | Disposition: A | Discharge status: Home / Self Care | Payer: Medicare Other | Attending: Emergency Medicine | Admitting: Emergency Medicine

## 2023-08-22 ENCOUNTER — Other Ambulatory Visit: Payer: Self-pay

## 2023-08-22 ENCOUNTER — Encounter (HOSPITAL_BASED_OUTPATIENT_CLINIC_OR_DEPARTMENT_OTHER): Payer: Self-pay

## 2023-08-22 DIAGNOSIS — M62838 Other muscle spasm: Secondary | ICD-10-CM | POA: Diagnosis not present

## 2023-08-22 DIAGNOSIS — M542 Cervicalgia: Secondary | ICD-10-CM | POA: Insufficient documentation

## 2023-08-22 MED ORDER — CYCLOBENZAPRINE HCL 5 MG PO TABS
5.00 mg | ORAL_TABLET | Freq: Once | ORAL | Status: AC
Start: 2023-08-22 — End: 2023-08-22
  Administered 2023-08-22: 5 mg via ORAL
  Filled 2023-08-22: qty 1

## 2023-08-22 MED ORDER — KETOROLAC TROMETHAMINE 30 MG/ML INJ
30.0000 mg | Freq: Once | Status: AC
Start: 2023-08-22 — End: 2023-08-22
  Administered 2023-08-22: 30 mg via INTRAMUSCULAR
  Filled 2023-08-22: qty 1

## 2023-08-22 MED ORDER — ACETAMINOPHEN 500 MG PO TABS
1000.0000 mg | ORAL_TABLET | Freq: Once | ORAL | Status: AC
Start: 2023-08-22 — End: 2023-08-22
  Administered 2023-08-22: 1000 mg via ORAL
  Filled 2023-08-22: qty 2

## 2023-08-22 MED ORDER — DICLOFENAC SODIUM 3 % EX GEL
2.00 g | Freq: Two times a day (BID) | CUTANEOUS | 0 refills | Status: AC
Start: 2023-08-22 — End: 2023-09-01

## 2023-08-22 MED ORDER — ONDANSETRON 4 MG PO TBDP
4.0000 mg | ORAL_TABLET | Freq: Once | ORAL | Status: AC
Start: 2023-08-22 — End: 2023-08-22
  Administered 2023-08-22: 4 mg via SUBLINGUAL
  Filled 2023-08-22: qty 1

## 2023-08-22 MED ORDER — LIDOCAINE 4 % EX PTCH
1.0000 | MEDICATED_PATCH | Freq: Once | CUTANEOUS | Status: DC
Start: 2023-08-22 — End: 2023-08-22
  Administered 2023-08-22: 1 via TRANSDERMAL
  Filled 2023-08-22: qty 1

## 2023-08-22 MED ORDER — CYCLOBENZAPRINE HCL 5 MG PO TABS
5.00 mg | ORAL_TABLET | Freq: Two times a day (BID) | ORAL | 0 refills | Status: AC | PRN
Start: 2023-08-22 — End: 2023-08-26

## 2023-08-22 NOTE — Narrator Note (Signed)
Patient Disposition  Patient education for diagnosis, medications, activity, diet and follow-up.  Patient left ED 6:51 PM.  Patient rep received written instructions.    Patient discharged by MD before obtaining discharge vitals. Ambulatory to waiting room with steady gait.

## 2023-08-22 NOTE — ED Triage Note (Signed)
PT STATES THAT SHE WOKE UP WITH PAIN IN THE R SIDE OF HER NECK AND SHE CAN'T TURN HER HEAD. PAIN HAS GOTTEN WORSE ALL DAY. PT TRIED ICE. NO TYLENOL TODAY. PT'S HEAD TILTED TO THE LEFT. PT STATES THIS HAPPENED TO HER A LONG TIME AGO.
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
## 2023-08-22 NOTE — Discharge Instructions (Signed)
You were seen in the ER today due to neck pain    Your evaluation in the ER included a physical exam and tests  that were reassuring and did not show any findings requiring you to be admitted to the hospital at this time.    Please follow up with your primary doctor within the next 2 days.     At home, you can use tylenol, diclofenac gel, and flexeril to treat your symptoms. We have prescribed flexeril and diclofenac to your pharmacy.    Please return to the ER or seek urgent medical evaluation if you have any new or concerning symptoms including:  Chest pain, difficulty breathing, persistent nausea and vomiting, being unable to drink water, swallowing problems, severe abdominal pain, dizziness, severe headache, confusion, fainting, fevers, worsening neck stiffness, or any other symptoms that concern you.

## 2023-08-23 ENCOUNTER — Encounter (HOSPITAL_BASED_OUTPATIENT_CLINIC_OR_DEPARTMENT_OTHER): Payer: Self-pay | Admitting: Family
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
## 2023-08-23 NOTE — Progress Notes (Signed)
Spoke to patient, discussed sleep study results that showed moderate OSA with severe oxygen desaturation.  CPAP titration is recommended and ordered by sleep specialist.  Once study is approved patient will be contacted by the sleep lab for an appointment.  Patient made aware that the next sleep study is done overnight in the sleep lab.

## 2023-08-26 ENCOUNTER — Encounter (HOSPITAL_BASED_OUTPATIENT_CLINIC_OR_DEPARTMENT_OTHER): Payer: Self-pay | Admitting: Family

## 2023-08-26 ENCOUNTER — Telehealth (HOSPITAL_BASED_OUTPATIENT_CLINIC_OR_DEPARTMENT_OTHER): Payer: Self-pay

## 2023-08-26 NOTE — Progress Notes (Signed)
CCM Brief Contact    Primary Purpose of Contact: ED/IP follow-up and Referral to resources and/or follow-up    Individuals involved: Patient  Pt reports she went to the ED last Thursday for a stiff neck. She was given a prescription of diclofenac and flexeril, neither of which she has used yet.  She reports her neck is still uncomfortable despite using heat, warm showers, ice and tylenol. Will try the flexeril and diclofenac this evening.  Pt was aware that a second sleep study was approved but was waiting for the sleep lab to call her.  Provided the phone number for the sleep lab, [phone] , and encouraged her to call to schedule the inpatient sleep study    Care management next steps: Encouraged pt to follow up with PCP if her neck stiffness does not improve with prescribed meds.  Encouraged her to contact sleep lab before the end of this week to schedule inpatient sleep study    Planned date of next contact: week of 09/09/23

## 2023-08-29 ENCOUNTER — Ambulatory Visit: Payer: Medicare Other | Attending: Critical Care Medicine

## 2023-08-29 ENCOUNTER — Other Ambulatory Visit: Payer: Self-pay

## 2023-08-29 DIAGNOSIS — G4733 Obstructive sleep apnea (adult) (pediatric): Secondary | ICD-10-CM | POA: Insufficient documentation

## 2023-09-06 ENCOUNTER — Telehealth (HOSPITAL_BASED_OUTPATIENT_CLINIC_OR_DEPARTMENT_OTHER): Payer: Self-pay | Admitting: Cardiovascular Disease

## 2023-09-06 ENCOUNTER — Other Ambulatory Visit (HOSPITAL_BASED_OUTPATIENT_CLINIC_OR_DEPARTMENT_OTHER): Payer: Self-pay | Admitting: Critical Care Medicine

## 2023-09-06 ENCOUNTER — Encounter (HOSPITAL_BASED_OUTPATIENT_CLINIC_OR_DEPARTMENT_OTHER): Payer: Self-pay | Admitting: Critical Care Medicine

## 2023-09-06 DIAGNOSIS — G4733 Obstructive sleep apnea (adult) (pediatric): Secondary | ICD-10-CM | POA: Diagnosis not present

## 2023-09-06 LAB — CPAP TITRATION

## 2023-09-06 NOTE — Telephone Encounter (Signed)
Please let patient know that her CPAP trial was done.  A CPAP machine with 1 L of oxygen was ordered for her.  It can take some weeks to get this at home.  When she gets the machine  she needs to wear it for at least 4 hours per night so that insurance will allow her to keep the machine and continue to pay for it.  She should be expecting a call to get the equipment set up.  I have also referred her to have an appointment with the sleep doctor so that she can be followed for this in the future.  Thanks, Alcario Drought

## 2023-09-06 NOTE — Progress Notes (Signed)
Sleep Study Findings:  *Please see below regarding communication of results to patient, next steps, and other helpful information.      Recent sleep study demonstrated: cpap titration, CPAP set up has been ordered.  1 liter of supplemental oxygen was added and this will also be ordered for patient.       Results of study and next steps will need to be communicated to patient by ordering provider.    Thanks to all.  Ordering provider cc'd here.        *The results of this patient's recent sleep study are now available in the Cardio/Pulm/Neuro tab (in-lab studies) or will soon be available scanned under the Media tab (home sleep studies).  The major findings are summarized above. The full report can be reviewed with additional details.    These results have not been communicated to the patient, and are now available for the ordering physician to go over with the patient and discuss management.  The sleep team is unable to review the results of the study with every patient unless they are already followed in sleep clinic. Sleep lab will assist with booking studies and facilitating CPAP equipment referrals, however lab is unable to address questions about report findings. These should be discussed with patient by ordering team; patient can also be offered a sleep clinic appointment for additional input.     If OSA is diagnosed, the sleep lab will help facilitate a CPAP titration study if needed (eg could not be completed on diagnostic night due to time constraints). Once insurance approval is obtained for the CPAP study, the sleep lab will call the patient to schedule. Patients may also call the sleep lab at [phone] to schedule the CPAP titration study if desired.  Some insurance plans may not approve a CPAP titration study, in which case autoset CPAP device is typically approved by insurance. Sleep team will assist with this prescription if needed.    If CPAP has already been titrated (eg split night study or  CPAP titration study), sleep team will order CPAP equipment to prevent delays in starting therapy. This typically takes a few weeks to arrange. Patients should be counseled to expect a call from a DME company to set up equipment at their home.    Please note that some health plans may not cover CPAP therapy ([employer], [employer])--patients will be offered self-pay for refurbished machine. Alternatives (such as basic machine through a non-profit foundation) may be available--if desired please refer patient to sleep clinic to discuss how to obtain this.  Medicare patients require a face-to-face visit between 31-90 days after CPAP set up to document use and benefit, in order for ongoing insurance coverage of therapy.     For patients diagnosed with OSA, a sleep clinic appointment will be automatically attempted to be scheduled after CPAP equipment is set-up, in an effort to ensure necessary documentation for ongoing CPAP therapy is completed.  Mask clinic is available at [facility] if needed for mask fitting. Please contact sleep lab Epic pool to ask about this.    If additional input is desired earlier in the process, sleep consultation can be obtained at any time through a referral to sleep clinic, and questions about the process can be directed to the Sleep Lab EPIC message pool.  Thanks to all.

## 2023-09-10 ENCOUNTER — Telehealth (HOSPITAL_BASED_OUTPATIENT_CLINIC_OR_DEPARTMENT_OTHER): Payer: Self-pay | Admitting: General Practice

## 2023-09-10 ENCOUNTER — Other Ambulatory Visit (HOSPITAL_BASED_OUTPATIENT_CLINIC_OR_DEPARTMENT_OTHER): Payer: Self-pay | Admitting: Internal Medicine

## 2023-09-10 DIAGNOSIS — M5416 Radiculopathy, lumbar region: Secondary | ICD-10-CM | POA: Diagnosis not present

## 2023-09-10 DIAGNOSIS — M48062 Spinal stenosis, lumbar region with neurogenic claudication: Secondary | ICD-10-CM | POA: Diagnosis not present

## 2023-09-10 NOTE — Telephone Encounter (Signed)
PER Pharmacy, Samantha Cameron is a 67 year old female has requested a refill of      -  antacid       Last Office Visit: 08/05/23 with casey, e.       DM EYE EXAM due on 06/08/2023     Other Med Adult:  Most Recent BP Reading(s)  08/22/23 : 144/82        Cholesterol (mg/dL)   Date Value   16/08/9603 166     LOW DENSITY LIPOPROTEIN DIRECT (mg/dL)   Date Value   54/07/8118 83     HIGH DENSITY LIPOPROTEIN (mg/dL)   Date Value   14/78/2956 47     TRIGLYCERIDES (mg/dL)   Date Value   21/30/8657 268 (H)         THYROID SCREEN TSH REFLEX FT4 (uIU/mL)   Date Value   03/15/2023 1.340         No results found for: "TSH"    HEMOGLOBIN A1C (%)   Date Value   06/24/2023 6.4 (H)       No results found for: "POCA1C"      INR (no units)   Date Value   07/21/2023 1.1   04/18/2017 1.0   05/27/2016 1.0       SODIUM (mmol/L)   Date Value   08/05/2023 143       POTASSIUM (mmol/L)   Date Value   08/05/2023 4.1           CREATININE (mg/dL)   Date Value   84/69/6295 0.7        Documented patient preferred pharmacies:    [facility] - [place], [place]  Phone: [phone] Fax: [phone]

## 2023-09-10 NOTE — Telephone Encounter (Signed)
I called patient of Dr Shon Baton  Samantha Cameron and DOB verified  I told her she would be hearing about CPAP machine that company would be calling her with the next week or so  I told her to call us back if she has not heard in 2 weeks  I explained that she needs to use the machine at least 4 hours/night or insurance would not pay for it

## 2023-09-12 ENCOUNTER — Telehealth (HOSPITAL_BASED_OUTPATIENT_CLINIC_OR_DEPARTMENT_OTHER): Payer: Self-pay

## 2023-09-13 IMAGING — MR COLUNA^LOMBAR
4 series · 19 of 48 positions shown · non-contrast
Comparison: none

[Series 2: STIR · coronal · 4.0mm · 0.64mm/px · 8 of 9 slices shown]
[im 1/9]
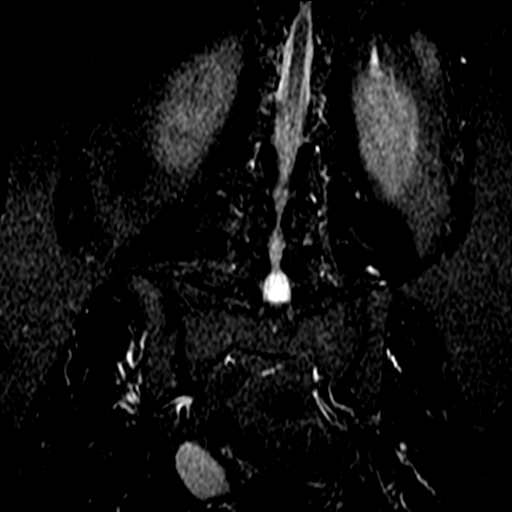
[im 2/9]
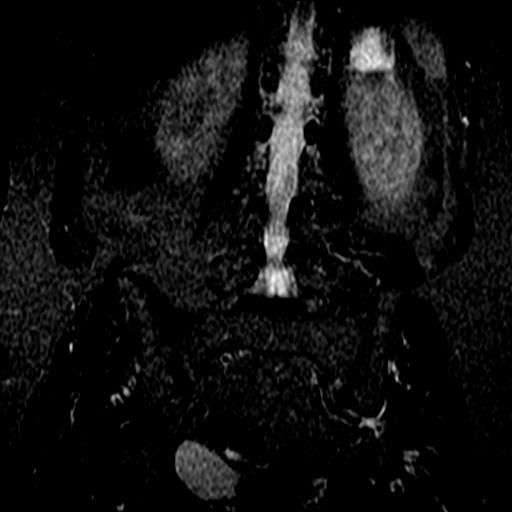
[im 3/9]
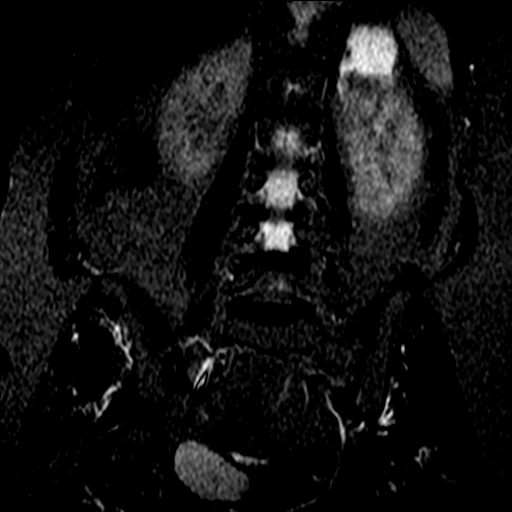
[im 4/9]
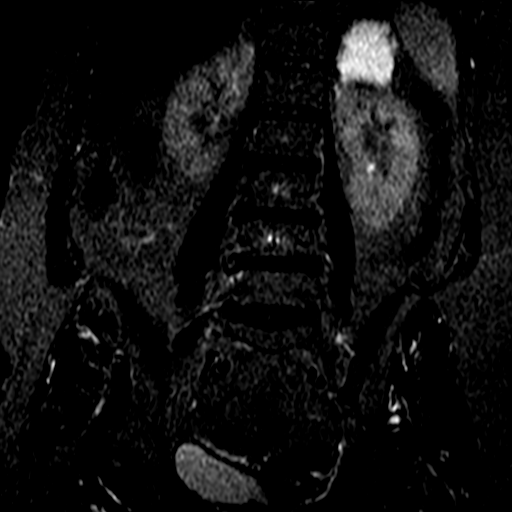
[im 5/9]
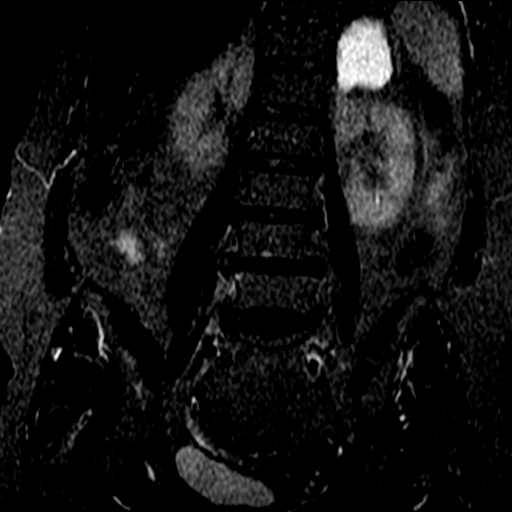
[im 6/9]
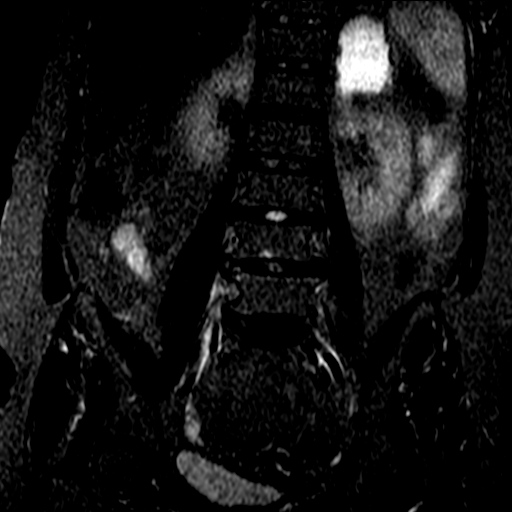
[im 7/9]
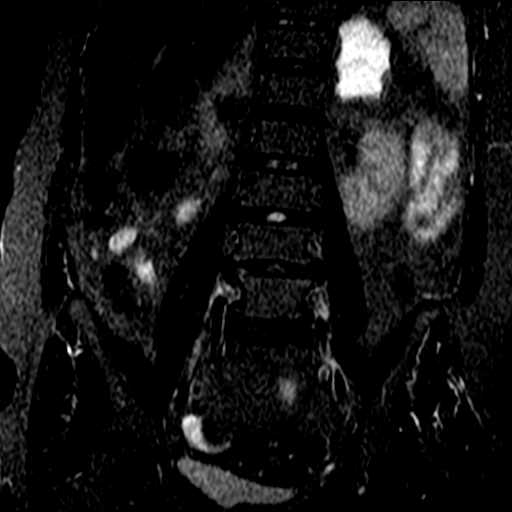
[im 9/9]
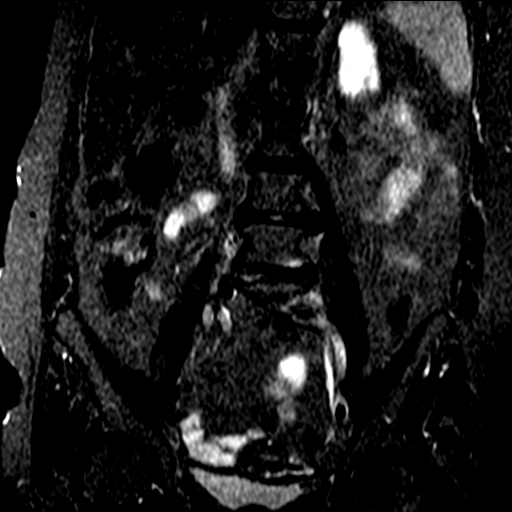

[Series 3: sagital t2_(person_name) · sagittal · 4.0mm · 0.52mm/px · 5 of 12 slices shown]
[im 1/12]
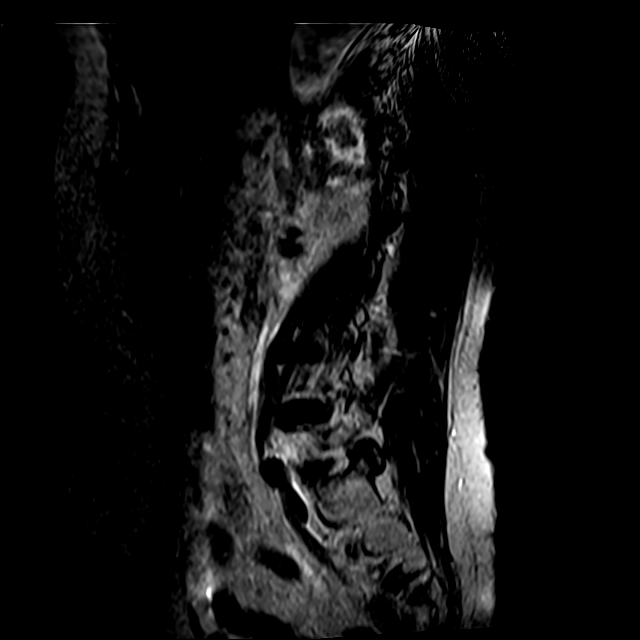
[im 2/12]
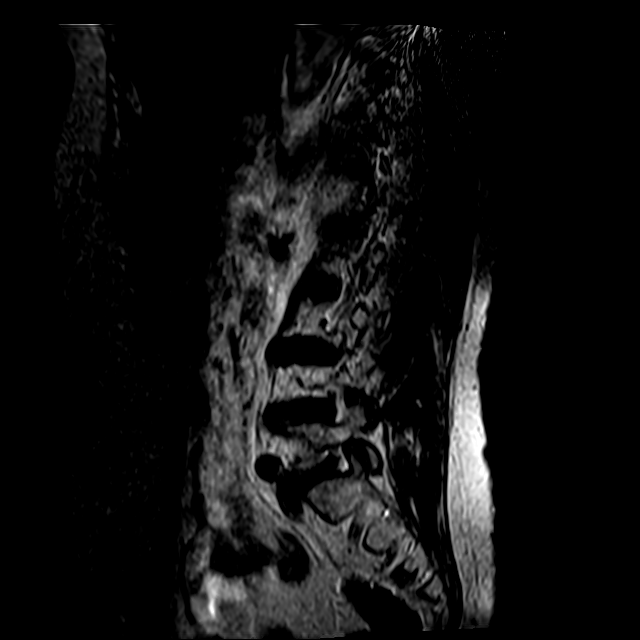
[im 3/12]
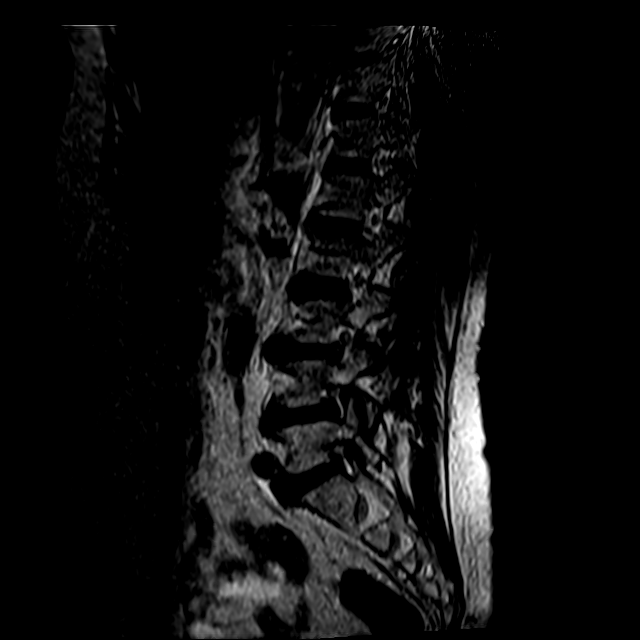
[im 6/12]
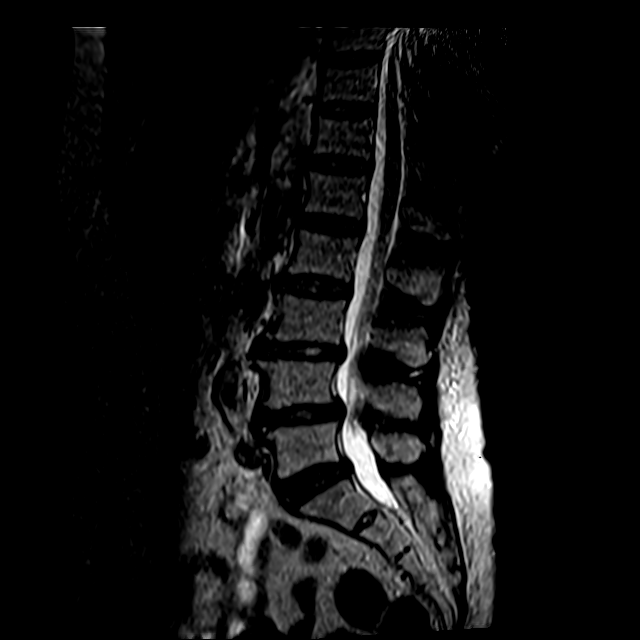
[im 10/12]
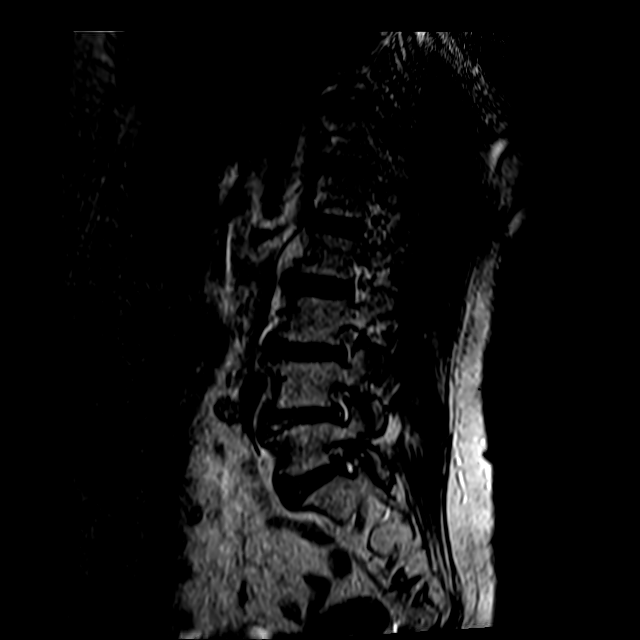

[Series 4: sagital t1_(person_name) · sagittal · 4.0mm · 1.03mm/px · 3 of 12 slices shown]
[im 2/12]
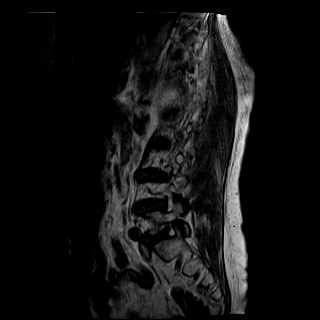
[im 6/12]
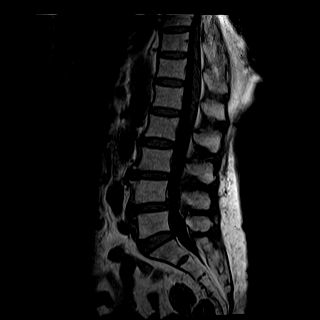
[im 10/12]
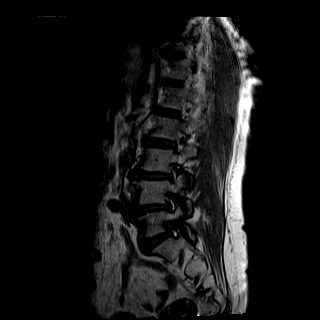

[Series 6: axial t2_(person_name) · axial · 4.0mm · 0.49mm/px · z∈[-52,+73]mm · 3 of 20 slices shown]
[im 4/20]
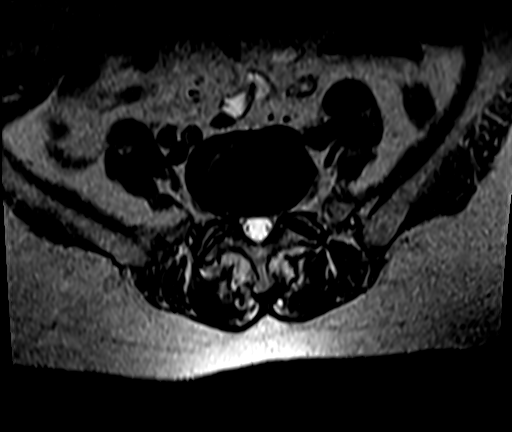
[im 11/20]
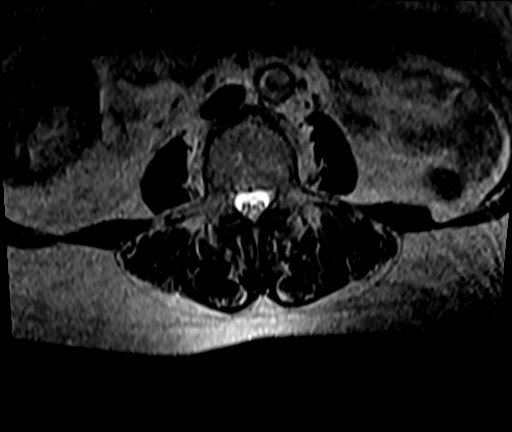
[im 17/20]
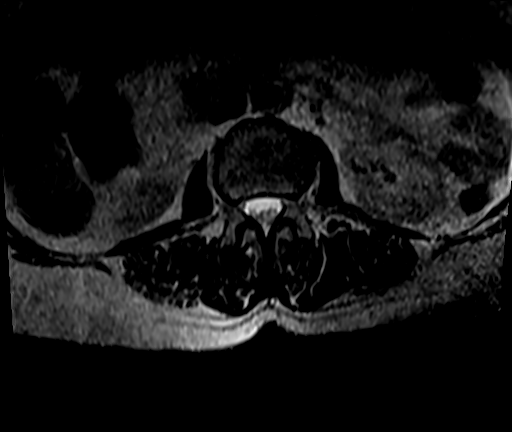

[19 of 48 positions shown; findings below may reference images not displayed]

RESSONÂNCIA MAGNÉTICA DE COLUNA LOMBAR

TÉCNICA:
Exame realizado em equipamento de ressonância magnética com sequências, ponderações e planos específicos para o segmento de interesse, sem a administração endovenosa do meio de contraste.
RESULTADO:

Retificação da curvatura lombar fisiológica.
Discreto escorregamento anterior (grau I) do corpo vertebral L4 sobre L5.
Osteófitos marginais nos corpos vertebrais lombares.
Hipertrofia das apófises articulares lombares.
Espaços discais intervertebrais L3-L4 e L4-L5 reduzidos.
Discos intervertebrais lombares com baixo sinal difuso em T2 compatível com desidratação do núcleo pulposo.
Nos níveis L3-L4 e L5-S1, os discos intervertebrais apresenta abaulamento difuso do contorno posterior, que projetam-se para o interior do canal vertebral com extensão foraminal bilateral.
O canal vertebral ósseo apresenta amplitude reduzida no nível L4-L5.
Forames intervertebrais L3-L4, L4-L5 e L5-S1 com redução bilateral da amplitude.
Os demais forames de conjugação visualizados são livres e apresentam amplitudes usuais.
Não há evidência de processo expansivo intracanalar.
O cone medular é tópico e tem aspecto anatômico.
CONCLUSÃO:
Retificação da curvatura lombar fisiológica.
Leve escorregamento anterior do corpo vertebral L4.
Estenose do canal vertebral no nível L4-L5.
Espondilodiscoartrose lombossacra.
Abaulamento discal difuso nos níveis L3-L4 e L5-S1.
Estenose foraminal bilateral nos níveis L3-L4, L4-L5 e L5-S1.

## 2023-09-24 ENCOUNTER — Encounter (HOSPITAL_BASED_OUTPATIENT_CLINIC_OR_DEPARTMENT_OTHER): Payer: Self-pay

## 2023-09-24 ENCOUNTER — Other Ambulatory Visit (HOSPITAL_BASED_OUTPATIENT_CLINIC_OR_DEPARTMENT_OTHER): Payer: Self-pay | Admitting: Critical Care Medicine

## 2023-09-24 DIAGNOSIS — G4733 Obstructive sleep apnea (adult) (pediatric): Secondary | ICD-10-CM

## 2023-09-24 NOTE — Progress Notes (Signed)
CPAP has been set up by [facility].

## 2023-09-25 ENCOUNTER — Encounter (HOSPITAL_BASED_OUTPATIENT_CLINIC_OR_DEPARTMENT_OTHER): Payer: Self-pay | Admitting: Family

## 2023-09-25 NOTE — Progress Notes (Signed)
Spoke to pt, discussed titration study results, referral for CPAP and O2 1 L/min with CPAP.   Pt reports she already received the CPAP machine last week but not the oxygen.  Spoke to Elliott in the sleep lab who confirmed that an order for the oxygen was sent to [facility] last week.  Patient should be hearing from [facility] about the oxygen.  Also discussed CPAP compliance and f/u in sleep clinic.

## 2023-09-27 ENCOUNTER — Encounter (HOSPITAL_BASED_OUTPATIENT_CLINIC_OR_DEPARTMENT_OTHER): Payer: Self-pay | Admitting: Critical Care Medicine

## 2023-09-27 NOTE — Progress Notes (Addendum)
Addendum to sleep study report from 08/30/23.    During stage wake the patient had oxygen saturation of 87% while awake and at rest.  Oxygen 1 liter was started with improved saturations above 90% while awake and at rest.     Recommend 1 liter of supplemental oxygen.

## 2023-09-30 ENCOUNTER — Ambulatory Visit: Payer: Medicare Other | Attending: Internal Medicine | Admitting: Internal Medicine

## 2023-09-30 ENCOUNTER — Other Ambulatory Visit: Payer: Self-pay

## 2023-09-30 ENCOUNTER — Other Ambulatory Visit (HOSPITAL_BASED_OUTPATIENT_CLINIC_OR_DEPARTMENT_OTHER): Payer: Self-pay | Admitting: Internal Medicine

## 2023-09-30 VITALS — BP 108/79 | HR 119 | Temp 99.0°F | Wt 213.0 lb

## 2023-09-30 DIAGNOSIS — K2100 Gastro-esophageal reflux disease with esophagitis, without bleeding: Secondary | ICD-10-CM | POA: Diagnosis present

## 2023-09-30 DIAGNOSIS — K2950 Unspecified chronic gastritis without bleeding: Secondary | ICD-10-CM | POA: Diagnosis present

## 2023-09-30 DIAGNOSIS — B349 Viral infection, unspecified: Secondary | ICD-10-CM | POA: Diagnosis present

## 2023-09-30 DIAGNOSIS — R1010 Upper abdominal pain, unspecified: Secondary | ICD-10-CM | POA: Insufficient documentation

## 2023-09-30 MED ORDER — FAMOTIDINE 20 MG PO TABS
20.0000 mg | ORAL_TABLET | Freq: Two times a day (BID) | ORAL | 3 refills | Status: DC
Start: 2023-09-30 — End: 2024-10-01

## 2023-09-30 MED ORDER — BLOOD GLUCOSE MONITORING SUPPL KIT: PACK | 0 refills | Status: DC

## 2023-09-30 NOTE — Telephone Encounter (Signed)
PER Patient (self), [name] is a 67 year old female has requested a refill of Blood Glucose Monitoring Suppl (GLUCOSE MONITORING KIT) monitoring kit .    Last Office Visit: 07/08/2023 Jamey Reas., MD    Other Med Adult:  Most Recent BP Reading(s)  09/30/23 : 108/79        Cholesterol (mg/dL)   Date Value   09/81/1914 166     LOW DENSITY LIPOPROTEIN DIRECT (mg/dL)   Date Value   78/29/5621 83     HIGH DENSITY LIPOPROTEIN (mg/dL)   Date Value   30/86/5784 47     TRIGLYCERIDES (mg/dL)   Date Value   69/62/9528 268 (H)         THYROID SCREEN TSH REFLEX FT4 (uIU/mL)   Date Value   03/15/2023 1.340         No results found for: "TSH"    HEMOGLOBIN A1C (%)   Date Value   06/24/2023 6.4 (H)       No results found for: "POCA1C"      INR (no units)   Date Value   07/21/2023 1.1   04/18/2017 1.0   05/27/2016 1.0       SODIUM (mmol/L)   Date Value   08/05/2023 143       POTASSIUM (mmol/L)   Date Value   08/05/2023 4.1           CREATININE (mg/dL)   Date Value   41/32/4401 0.7       Documented patient preferred pharmacies:    [facility] - [place], [place]  Phone: [phone] Fax: [phone]

## 2023-10-01 ENCOUNTER — Other Ambulatory Visit (HOSPITAL_BASED_OUTPATIENT_CLINIC_OR_DEPARTMENT_OTHER): Payer: Self-pay | Admitting: Internal Medicine

## 2023-10-01 NOTE — Telephone Encounter (Signed)
PER Pharmacy, Samantha Cameron is a 67 year old female has requested a refill of albuterol inhaler.      Last OFFICE/TELE Visit:  09/30/23 with simons      Last Physical Exam:   08/15/2015     DM EYE EXAM due on 06/08/2023    Other Med Adult:  Most Recent BP Reading(s)  09/30/23 : 108/79        Cholesterol (mg/dL)   Date Value   28/41/3244 166     LOW DENSITY LIPOPROTEIN DIRECT (mg/dL)   Date Value   11/21/7251 83     HIGH DENSITY LIPOPROTEIN (mg/dL)   Date Value   66/44/0347 47     TRIGLYCERIDES (mg/dL)   Date Value   42/59/5638 268 (H)         THYROID SCREEN TSH REFLEX FT4 (uIU/mL)   Date Value   03/15/2023 1.340         No results found for: "TSH"    HEMOGLOBIN A1C (%)   Date Value   06/24/2023 6.4 (H)       No results found for: "POCA1C"      INR (no units)   Date Value   07/21/2023 1.1   04/18/2017 1.0   05/27/2016 1.0       SODIUM (mmol/L)   Date Value   08/05/2023 143       POTASSIUM (mmol/L)   Date Value   08/05/2023 4.1           CREATININE (mg/dL)   Date Value   75/64/3329 0.7       Documented patient preferred pharmacies:    [facility] - [place], [place]  Phone: [phone] Fax: [phone]

## 2023-10-02 DIAGNOSIS — M5417 Radiculopathy, lumbosacral region: Secondary | ICD-10-CM | POA: Diagnosis not present

## 2023-10-03 ENCOUNTER — Telehealth (HOSPITAL_BASED_OUTPATIENT_CLINIC_OR_DEPARTMENT_OTHER): Payer: Self-pay

## 2023-10-08 DIAGNOSIS — Z79891 Long term (current) use of opiate analgesic: Secondary | ICD-10-CM | POA: Diagnosis not present

## 2023-10-08 DIAGNOSIS — M4727 Other spondylosis with radiculopathy, lumbosacral region: Secondary | ICD-10-CM | POA: Diagnosis not present

## 2023-10-08 DIAGNOSIS — M5136 Other intervertebral disc degeneration, lumbar region with discogenic back pain only: Secondary | ICD-10-CM | POA: Diagnosis not present

## 2023-10-15 ENCOUNTER — Telehealth (HOSPITAL_BASED_OUTPATIENT_CLINIC_OR_DEPARTMENT_OTHER): Payer: Self-pay

## 2023-10-15 ENCOUNTER — Encounter (HOSPITAL_BASED_OUTPATIENT_CLINIC_OR_DEPARTMENT_OTHER): Payer: Self-pay

## 2023-10-15 NOTE — Telephone Encounter (Signed)
Called pt after receiving the following message from [facility]:  Emailed pt the information below and spoke with her. She will call them now    [facility] has tried reaching out to the patient, regarding the night time oxygen order placed by Dr. Emelda Fear. [facility] can't reach the patient, multiple attempts were made. If further questions are needed, Precilla from [employer] can be reached at [phone] (ext. [phone]).

## 2023-10-15 NOTE — Telephone Encounter (Signed)
[facility] has tried reaching out to the patient, regarding the night time oxygen order placed by Dr. Emelda Fear. [facility] can't reach the patient, multiple attempts were made. If further questions are needed, Precilla from [employer] can be reached at [phone] (ext. [phone]).

## 2023-10-21 ENCOUNTER — Encounter (HOSPITAL_BASED_OUTPATIENT_CLINIC_OR_DEPARTMENT_OTHER): Payer: Self-pay

## 2023-10-28 ENCOUNTER — Other Ambulatory Visit (HOSPITAL_BASED_OUTPATIENT_CLINIC_OR_DEPARTMENT_OTHER): Payer: Self-pay

## 2023-10-28 NOTE — Progress Notes (Signed)
CCM Discharge    1. Date of enrollment: 07/03/2023        2. Intensity (average) of CCM intervention: bi-monthly contact    3. Focus of CCM intervention: Reduction of ED/IP utilization, Patient engagement/activation, and Disease and self-management Improvement    4. Reason for Resolving CCM: Patient/family disengaged, not motivated to address CM goals, or declined further CCM engagement    5a. Care Manager's evaluation of patient's engagement in CCM: Partially engaged    5b. Care Manager's evaluation of progress towards Care Management goals: Partially met  Outcomes achieved: ED/IP utilization reduced and Disease management and outcomes improved    6. Follow-up plan/goals of care post-CCM discharge and responsible care coordination/care management team contacts: Follow ups scheduled with PCP, sleep medicine, cardiology and GI    Please note future team contact information is also documented in the patient's My Care Plan.    CCM Problem/Episode resolved/CM name removed from Care Team:Yes      Additional comments: none  Thank you for the opportunity to work with this patient. Please discuss future care coordination needs with the team noted above (#6). If the patient's needs or status change in the future, the patient may be eligible for re-referral to [facility].

## 2023-11-07 DIAGNOSIS — M25561 Pain in right knee: Secondary | ICD-10-CM | POA: Diagnosis not present

## 2023-11-07 DIAGNOSIS — M47817 Spondylosis without myelopathy or radiculopathy, lumbosacral region: Secondary | ICD-10-CM | POA: Diagnosis not present

## 2023-11-11 ENCOUNTER — Ambulatory Visit (HOSPITAL_BASED_OUTPATIENT_CLINIC_OR_DEPARTMENT_OTHER): Payer: Self-pay

## 2023-11-11 ENCOUNTER — Other Ambulatory Visit: Payer: Self-pay

## 2023-11-11 ENCOUNTER — Ambulatory Visit: Payer: Medicare Other | Attending: Family Medicine | Admitting: Family Medicine

## 2023-11-11 ENCOUNTER — Encounter (HOSPITAL_BASED_OUTPATIENT_CLINIC_OR_DEPARTMENT_OTHER): Payer: Self-pay | Admitting: Family Medicine

## 2023-11-11 VITALS — BP 121/74 | HR 95 | Temp 97.9°F | Wt 216.0 lb

## 2023-11-11 DIAGNOSIS — H6123 Impacted cerumen, bilateral: Secondary | ICD-10-CM | POA: Insufficient documentation

## 2023-11-11 DIAGNOSIS — H6502 Acute serous otitis media, left ear: Secondary | ICD-10-CM | POA: Diagnosis present

## 2023-11-11 MED ORDER — CARBAMIDE PEROXIDE 6.5 % OT SOLN
5.0000 [drp] | Freq: Two times a day (BID) | OTIC | 0 refills | Status: AC
Start: 2023-11-11 — End: 2023-11-15

## 2023-11-11 MED ORDER — AMOXICILLIN-POT CLAVULANATE 875-125 MG PO TABS
1.0000 | ORAL_TABLET | Freq: Two times a day (BID) | ORAL | 0 refills | Status: AC
Start: 2023-11-11 — End: 2023-11-16

## 2023-11-11 NOTE — Progress Notes (Signed)
 Samantha Cameron is a 67 year old female here for Patient presents with:  Ear Ache: Left      HPI:    URI symptoms- congestion, runny nose, sneezing  Left ear pain, difficulty hearing out of the ear  Right ear also feels muffled but no pain  No fevers  No drainage from the ear  No cough or shortness of breath    REVIEW OF SYSTEMS: as noted above    PE:   BP 121/74   Pulse 95   Temp 97.9 ?F (36.6 ?C) (Temporal)   Wt 98 kg (216 lb)   LMP 10/22/2007 (LMP Unknown)   SpO2 97%   BMI 39.51 kg/m?     Gen: Well-appearing, speaking normally, in no acute distress  HEENT: Head atraumatic, moist mucous membranes, normal conjunctiva  R TM impacted w/ cerumen  L TM partially visible, partially blocked by cerumen, erythematous     A/P:  (H65.02) Non-recurrent acute serous otitis media of left ear  (primary encounter diagnosis)  Comment: Unable to clear L ear canal completely to visualize however what could be visualized in combination with clinical history suggests AOM. Treat with augmentin x5 days  Plan: amoxicillin-clavulanate (AUGMENTIN) 875-125 MG         per tablet    (H61.23) Bilateral impacted cerumen  Comment: R canal cleared with lavage with immediate improvement in patient's symptoms on that side. Other side I partially cleared with curette but did not lavage due to concern for AOM. Advised to complete abx first, then once pain is resolved may use debrox drops x4 days. If not resolved after that, RTC for lavage.  Plan: carbamide peroxide (DEBROX) 6.5 % otic solution              Azzie Glatter, MD

## 2023-11-11 NOTE — Telephone Encounter (Signed)
 Reason for Disposition   SEVERE earache pain (e.g., excruciating)    Answer Assessment - Initial Assessment Questions  Left ear pain, started with diminshed hearing last week, now is painful pain is severe unable to sleep  +Runny nose, decreased hearing, ha  No fever chills, cough, drainage or FB in ear. No hearing aids    Scheduled Appointments (Next 7 days)    Nov 11, 2023 8:40 AM  (Arrive by 8:25 AM)  Established Patient with Azzie Glatter, MD  [facility] ([facility]) [address]  3rd [address]  [place] [address]  [phone]   Please arrive 15 minutes prior to your appointment for Registration   process    Protocols used: Earache-A-OH    Advised per nursing triage protocol.      Recommended disposition for patient:Disposition: See in Office Today    If patient referred to UC/ED advised that they may require further follow up and testing after the visit with their primary care office.     Instructed patient to call back for any new, worsening, or worrisome symptoms or concerns any time day or night.

## 2023-11-19 ENCOUNTER — Ambulatory Visit (HOSPITAL_BASED_OUTPATIENT_CLINIC_OR_DEPARTMENT_OTHER): Payer: Medicare Other | Admitting: Family

## 2023-11-25 DIAGNOSIS — M25561 Pain in right knee: Secondary | ICD-10-CM | POA: Diagnosis not present

## 2023-11-28 ENCOUNTER — Other Ambulatory Visit (HOSPITAL_BASED_OUTPATIENT_CLINIC_OR_DEPARTMENT_OTHER): Payer: Self-pay | Admitting: Internal Medicine

## 2023-11-28 MED ORDER — GLUCOSE BLOOD VI STRP
ORAL_STRIP | 11 refills | Status: AC
Start: 2023-11-28 — End: 2024-11-27

## 2023-11-28 MED ORDER — LANCETS: 11 refills | Status: DC

## 2023-11-28 NOTE — Telephone Encounter (Signed)
PATIENTS INSURANCE REQUIRES HER TO FILL HER TESTING SUPPLY THROUGH [employer]IC[employer] B - WHICH ONLY [facility] CALL FILL. WE ARE PENDING NEW RX TO SEND TO [facility] SO THEY CAN SEND TO [facility] FOR PATIENT PICK UP       PER Patient (self), Samantha Cameron is a 68 year old female has requested a refill of TESTING STRIPS & LANCETS    Last Office Visit: 11/11/2023    Other Med Adult:  Most Recent BP Reading(s)  11/11/23 : 121/74        Cholesterol (mg/dL)   Date Value   96/29/5284 166     LOW DENSITY LIPOPROTEIN DIRECT (mg/dL)   Date Value   13/24/4010 83     HIGH DENSITY LIPOPROTEIN (mg/dL)   Date Value   27/25/3664 47     TRIGLYCERIDES (mg/dL)   Date Value   40/34/7425 268 (H)         THYROID SCREEN TSH REFLEX FT4 (uIU/mL)   Date Value   03/15/2023 1.340         No results found for: "TSH"    HEMOGLOBIN A1C (%)   Date Value   06/24/2023 6.4 (H)       No results found for: "POCA1C"      INR (no units)   Date Value   07/21/2023 1.1   04/18/2017 1.0   05/27/2016 1.0       SODIUM (mmol/L)   Date Value   08/05/2023 143       POTASSIUM (mmol/L)   Date Value   08/05/2023 4.1           CREATININE (mg/dL)   Date Value   95/63/8756 0.7       Documented patient preferred pharmacies:    [facility], [place], [place] - [address]  Phone: [phone] Fax: [phone]

## 2023-12-02 DIAGNOSIS — M1711 Unilateral primary osteoarthritis, right knee: Secondary | ICD-10-CM | POA: Diagnosis not present

## 2023-12-04 ENCOUNTER — Encounter (HOSPITAL_BASED_OUTPATIENT_CLINIC_OR_DEPARTMENT_OTHER): Payer: Self-pay

## 2023-12-05 DIAGNOSIS — M47817 Spondylosis without myelopathy or radiculopathy, lumbosacral region: Secondary | ICD-10-CM | POA: Diagnosis not present

## 2023-12-05 DIAGNOSIS — Z79891 Long term (current) use of opiate analgesic: Secondary | ICD-10-CM | POA: Diagnosis not present

## 2023-12-05 DIAGNOSIS — M5416 Radiculopathy, lumbar region: Secondary | ICD-10-CM | POA: Diagnosis not present

## 2023-12-12 DIAGNOSIS — M47817 Spondylosis without myelopathy or radiculopathy, lumbosacral region: Secondary | ICD-10-CM | POA: Diagnosis not present

## 2023-12-16 ENCOUNTER — Ambulatory Visit: Payer: Medicare Other | Attending: Cardiovascular Disease | Admitting: Critical Care Medicine

## 2023-12-16 ENCOUNTER — Encounter (HOSPITAL_BASED_OUTPATIENT_CLINIC_OR_DEPARTMENT_OTHER): Payer: Self-pay | Admitting: Critical Care Medicine

## 2023-12-16 ENCOUNTER — Ambulatory Visit (HOSPITAL_BASED_OUTPATIENT_CLINIC_OR_DEPARTMENT_OTHER): Payer: Medicare Other | Admitting: Cardiovascular Disease

## 2023-12-16 DIAGNOSIS — G4733 Obstructive sleep apnea (adult) (pediatric): Secondary | ICD-10-CM

## 2023-12-16 NOTE — Progress Notes (Signed)
Reason for Visit:       Sleep Apnea        HPI: Samantha Cameron is a 68 year old  patient with history of COPD, DM,  tachycarida, chronic back pain and leg pain, referred for home testing which showed moderate OSA with severe oxygen desarurations.  Returned for CPAP titration which was done with 1 liter of oxygen and CPAP 16-20 appeared optimal.  Was ordered for CPAP which she got around mid November and now has CPAP with oxygen at home.  Patient reports having CPAP years ago but stopped the machine, and this is now a new CPAP set up.      She reports feeling better with CPAP, sleeping better and more refreshed in the AM.  She is using a full face mask.  The pressure feels a bit high.  She is not sure about mask leaks.  She has changed the mask cushion x 1 so far.  She cleans 1 x per week.      Her main issue is that the water on the humidifier runs out after about 4 + hours and when this happens she feels she cannot continue with the CPAP and takes the mask off, then sleeps the rest of the night without.  Her oxygen is added into the CPAP mask.    She has not changed any humidifier settings on the PAP.  She likes to sleep in a cool environment.  She sleeps on her sides and front.    She feels less tired now and denies issues with her driving.  She drinks decaf coffee and no alcohol.     Bed time is around 10 pm and OOB at around 8 but wakes up earlier for grand kids.  She wakes when the water runs out but is otherwise able to sleep through.  She reports her back and legs are an issue for sleep, no clear leg movements waking her up.      She smoked for many years and stopped heavy smoking a while ago but still has an occasional one, not often.  She reports being told she has COPD, has been treated with inhalers, but has never had home oxygen.  She now has oxygen 1 liter with her CPAP, and has a concentrator and some tanks.  Her DME provider is [facility] for both oxygen and CPAP.      Her CPAP compliance was reviewed today.           Caffeine,soda , chocolate consumption:  as above   Drugs that may interfere with/augment sleep:  not taking any   Restless legs: n  Parasomnia/movements at night (Sleep walking, talking, terrors, violence) n  Narcolepsy symptoms : (Cataplexy, sleep paralysis, sleep hallucinations) n    Circadian rhythm/sleep phase/shift work issues: as above none   Daytime naps: n  Drowsy work: n/a  Drowsy driving: n  Review of System:     Review of [employer] no major change  HEENT no other issues no nose bleeds, no alleriges  Chest no shortness of breath  Cor  no palps waking up  Abd  GERD is not waking up  Back pain, no clear leg movements       Family history:    Review of patient's family history indicates:  Problem: Diabetes      Relation: Mother          Age of Onset: (Not Specified)  Problem: Heart      Relation: Brother  Age of Onset: (Not Specified)          Comment: CAD and AAA  Problem: Hypertension      Relation: Mother          Age of Onset: (Not Specified)  Problem: Arthritis      Relation: Mother          Age of Onset: (Not Specified)  Problem: Stroke      Relation: Mother          Age of Onset: (Not Specified)  Problem: Diabetes      Relation: Brother          Age of Onset: (Not Specified)          Comment: same brother  Problem: Cancer - Other      Relation: Father          Age of Onset: (Not Specified)          Comment: type uncertain  Problem: Thyroid      Relation: Sister          Age of Onset: (Not Specified)  Problem: Alcohol/Drug Abuse      Relation: Son          Age of Onset: (Not Specified)  Problem: OTHER      Relation: Son          Age of Onset: (Not Specified)          Comment: ?brain aneurysm per steward records  Problem: No Known Problems      Relation: Grandchild          Age of Onset: (Not Specified)          Comment: 6  Problem: Cancer - Breast      Relation: FamHxNeg          Age of Onset: (Not Specified)  Problem: Cancer - Cervical      Relation: FamHxNeg          Age of Onset:  (Not Specified)  Problem: Cancer - Ovarian      Relation: FamHxNeg          Age of Onset: (Not Specified)  Problem: Cancer - Colon      Relation: FamHxNeg          Age of Onset: (Not Specified)  Problem: Psychiatric Illness      Relation: FamHxNeg          Age of Onset: (Not Specified)          Social history:     Social History     Socioeconomic History    Marital status: Divorced     Spouse name: Not on file    Number of children: Not on file    Years of education: Not on file    Highest education level: Not on file   Occupational History    Not on file   Tobacco Use    Smoking status: Every Day     Current packs/day: 0.00     Average packs/day: 0.5 packs/day for 31.0 years (15.5 ttl pk-yrs)     Types: Cigarettes     Start date: 03/11/1987     Last attempt to quit: 03/10/2018     Years since quitting: 5.7    Smokeless tobacco: Never    Tobacco comments:     quit smoking inform provided to patient   Substance and Sexual Activity    Alcohol use: No    Drug use: No    Sexual activity:  Not Currently     Partners: Male     Birth control/protection: Tubal Ligation   Other Topics Concern    Not on file   Social History Narrative    Lives with son    1 daughter and another son passed away    Disabled from back    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 Simons,MD, 07/15/2015, 2:56 PM        Social Drivers of Catering manager Strain: Not on file  Food Insecurity: Not on file  Transportation Needs: Not on file  Physical Activity: Not on file  Stress: Not on file  Social Connections: Not on file  Intimate Partner Violence: Not on file  Housing Stability: Not on file      Home situation: no concerns    Occupation and work exposures: retired     Social History    Tobacco Use      Smoking status: Every Day         Packs/day: 0.00        Years: 0.5 packs/day for 31.0 years (15.5 ttl pk-yrs)        Types: Cigarettes        Start date: 03/11/1987        Last attempt to quit: 03/10/2018        Years since quitting: 5.7      Smokeless tobacco: Never      Tobacco comments: quit smoking inform provided to patient        Alcohol use   No           Drug use:   No     Exercise habits: some wakling     PMH:   Past Medical History:  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                Continue Lisinopril but will decrease dose as above given               low normal BPs and plan to increase Toprol XL.    No date: Arthritis  No date: Back pain  No date: Bilateral knee pain      Comment:  per steward records, mri - see scanned - tear meniscus                right, politeal cyst, mcl sprain 06/2014, s/p left knee                replacement. 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   1. GERD - severe 2.                Unintentional weight loss 3. Abdominal pain     Plan: 1.                Recommend EGD. This procedure has been fully  reviewed                with the patient and written informed consent has been                obtained. 2.   No date: Esophageal reflux  No date: External hemorrhoid      Comment:  per steward records  04/19/2017: Gastritis without bleeding      Comment:  GI endoscopy: 06/2018: Mildly severe reflux esophagitis.               Biopsied.      - Chronic gastritis. Biopsied.      - No                gross lesions in the entire examined duodenum.                Complications:  No immediate complications.                Recommendation:      - Await pathology results. 7/2019GI:               Assessment: Pt is 56F with PMH COPD, morbid obesity,                H.pylori (2009, unclear if treated) who p/w severe GERD                symptoms, unintentional weigh  05/10/2018: Gastroesophageal reflux disease without esophagitis  No date: HTN (hypertension)  02/01/2012: Hypoxia  02/16/2017: Left ear pain  06/07/2015: Left genital labial abscess  06/16/2015: Obesity, Class III, 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y) (HCC)  No date: Obstructive chronic bronchitis with exacerbation (HCC)  08/16/2015: Osteopenia      Comment:  On xray of left knee per steward records 06/2014 Unclear                if addressed  05/23/2018: Pancreatic lesion  09/14/2015: Right ear impacted cerumen  No date: Sleep apnea  08/15/2015: Type 2 diabetes mellitus without complication, without   long-term current use of insulin (HCC)      Comment:   HEMOGLOBIN A1C (%) Date Value 07/15/2015 6.2 (H)                ----------  [facility] record - a1c 6.2 on 01/31/15 6.4 on                09/30/14 5.9 on 12/29/13 6.4 08/18/13   11/18/2017: Viral URI with cough  No date: Wears eyeglasses     Allergies-  Review of Patient's Allergies indicates:   Ace inhibitors          Swelling   Codeine camsylate       Shortness of Breath, Other (See                            Comments)    Comment:Chest pain   Motrin [ibuprofen]      Nausea Only   Sulfa antibiotics       Other (See Comments)    Bactrim                 Rash, Itching  Current Outpatient Medications   Medication Sig    glucose blood test strip Use once daily as directed. Dispense test strips covered by insurance - using freestyle lite. Dx e11.29    Lancets Use 1 time daily.  Dispense lancets that are covered by insurance. ICD Code e11.29    albuterol HFA 108 (90 Base) MCG/ACT inhaler Inhale 2 puffs into the lungs every 6 (six) hours as needed for Wheezing    famotidine (PEPCID) 20 MG tablet Take 1 tablet by mouth in the morning and 1 tablet before bedtime.    atorvastatin (LIPITOR) 40 MG tablet Take 1 tablet by mouth at bedtime    SUMAtriptan (IMITREX) 50 MG tablet Take 1 tablet by mouth as needed for Migraine (can take 2nd dose in 2 hours if not resolved, no more than 2 tabs in 24 hours)    metFORMIN (GLUCOPHAGE-XR) 500 MG 24 hr tablet Take 2 tablets by mouth daily with breakfast    metoprolol (TOPROL-XL) 25 MG 24 hr tablet Take 1 tablet by mouth in the morning.    omeprazole (PRILOSEC) 20 MG capsule Take 1 capsule by mouth in the morning.    aspirin 81 MG EC tablet Take 1 tablet by mouth in the morning.    cholecalciferol (D3-1000) 1000 UNIT tablet Take 1 tablet by mouth in the morning.    fluticasone (FLONASE) 50 MCG/ACT nasal spray 1 spray by Each Nostril route in the morning.    cyclobenzaprine (FLEXERIL) 5 MG tablet Take 5 mg by mouth 3 (three) times daily as needed    B Complex-C-Folic Acid (B-COMPLEX/VITAMIN C) TABS Take 1 each by mouth in the morning.    oxycodone-acetaminophen (PERCOCET) 5-325 MG per tablet Take 1 tablet by mouth 2 (two) times daily as needed With Dr Lucianne Muss     No current facility-administered medications for this visit.     No PE as audio only visit   Physical exam  LMP 10/22/2007 (LMP Unknown)   68 year old female    Voice: Normal       Sleep data:      Home sleep study AHI 15,  majority of the night was < 88%  In lab titration: optimal PAP 16-20 APAP with 1 liter oxygen    Compliance download  Average usage about  4-5 hours, meet compliance first 30 days  High mask leak,  pressures 16-20 , average around 18  AHI on machine 0.9    Assessment:        Sleep apnea, obstructive  (primary encounter diagnosis)    During the visit we discussed the sleep study results in detail.  We reviewed the pathophysiology of sleep apnea, including the risks of sleep apnea such as hypertension, heart disease and worsening daibetes.  We also reviewed the risks of untreated OSA in regards to accidents ,  such as drowsy driving and the patient was advised on ways to managed sleepiness, such as naps before driving or getting of the road if too tired.    The treatment options for OSA were reviewed including CPAP, dental applianced and surgery.  The advantages of CPAP were reviewed.  The importance of treatment compliance ( at least 4 hours per night with CPAP or OAT) were also discussed.   The advantaged of CPAP when there are low oxygen levels was also reviewed.    Factors that will worsen OSA including weight gain, alcohol use close to bed time, in many patients back sleep, and also sleep deprivation were reviewed.  The patient was encouraged to work on weight loss, avoid alcohol close to bed time ( which she denies)  and also avoiding back sleep.   She has been on CPAP for several months and feels it is helping.  Main issue has been dryness and discomfort when the water runs out.   Has high mask leak which can worsen dryness and use up water.  Also appears might be able to use a lower pressure.  Reviewed benefits of using PAP as many hours as possible and encouraged her to keep the mask on, or put it back on.  She is motivated as she does feel a bit better overall.  Good sleep hygiene was also reviewed and importance of treating OSA with underlying COPD.  Plan will be to change pressure to 14-18 on the PAP, suggest patient try a cool vaporizer in the room. Will also schedule 3 month clinic follow up and patient was provided with my contact  information in the mean time for any additional questions.      Plan:    As above       I spent a total of 34 minutes on this visit on the date of service (total time includes all activities performed on the date of service)      Blair Hailey, MD  12/16/2023

## 2023-12-21 ENCOUNTER — Other Ambulatory Visit (HOSPITAL_BASED_OUTPATIENT_CLINIC_OR_DEPARTMENT_OTHER): Payer: Self-pay | Admitting: Emergency Medicine

## 2023-12-21 ENCOUNTER — Ambulatory Visit
Admission: RE | Admit: 2023-12-21 | Discharge: 2023-12-21 | Disposition: A | Discharge status: Home / Self Care | Payer: Medicare Other

## 2023-12-21 ENCOUNTER — Other Ambulatory Visit: Payer: Self-pay

## 2023-12-21 DIAGNOSIS — R52 Pain, unspecified: Secondary | ICD-10-CM

## 2023-12-21 DIAGNOSIS — R202 Paresthesia of skin: Secondary | ICD-10-CM | POA: Diagnosis not present

## 2023-12-21 DIAGNOSIS — M79605 Pain in left leg: Secondary | ICD-10-CM | POA: Insufficient documentation

## 2023-12-26 ENCOUNTER — Encounter (HOSPITAL_BASED_OUTPATIENT_CLINIC_OR_DEPARTMENT_OTHER): Payer: Self-pay

## 2023-12-27 ENCOUNTER — Ambulatory Visit
Payer: Medicare Other | Attending: Internal Medicine | Admitting: Student in an Organized Health Care Education/Training Program

## 2023-12-27 ENCOUNTER — Encounter (HOSPITAL_BASED_OUTPATIENT_CLINIC_OR_DEPARTMENT_OTHER): Payer: Self-pay | Admitting: Student in an Organized Health Care Education/Training Program

## 2023-12-27 ENCOUNTER — Other Ambulatory Visit: Payer: Self-pay

## 2023-12-27 VITALS — BP 92/67 | HR 113 | Temp 99.3°F | Wt 219.8 lb

## 2023-12-27 DIAGNOSIS — H6690 Otitis media, unspecified, unspecified ear: Secondary | ICD-10-CM | POA: Diagnosis present

## 2023-12-27 DIAGNOSIS — E7849 Other hyperlipidemia: Secondary | ICD-10-CM | POA: Diagnosis present

## 2023-12-27 DIAGNOSIS — R809 Proteinuria, unspecified: Secondary | ICD-10-CM | POA: Insufficient documentation

## 2023-12-27 DIAGNOSIS — I4729 Other ventricular tachycardia: Secondary | ICD-10-CM | POA: Insufficient documentation

## 2023-12-27 DIAGNOSIS — M542 Cervicalgia: Secondary | ICD-10-CM | POA: Diagnosis present

## 2023-12-27 DIAGNOSIS — E1129 Type 2 diabetes mellitus with other diabetic kidney complication: Secondary | ICD-10-CM | POA: Insufficient documentation

## 2023-12-27 DIAGNOSIS — H6122 Impacted cerumen, left ear: Secondary | ICD-10-CM | POA: Insufficient documentation

## 2023-12-27 LAB — HEMOGLOBIN A1C
ESTIMATED AVERAGE GLUCOSE: 143 mg/dL (ref 74–160)
HEMOGLOBIN A1C: 6.6 % — ABNORMAL HIGH (ref 4.0–5.6)

## 2023-12-27 LAB — CHOLESTEROL: Cholesterol: 168 mg/dL (ref 0–239)

## 2023-12-27 LAB — HIGH DENSITY LIPOPROTEIN: HIGH DENSITY LIPOPROTEIN: 46 mg/dL (ref 40–60)

## 2023-12-27 LAB — LOW DENSITY LIPOPROTEIN DIRECT: LOW DENSITY LIPOPROTEIN DIRECT: 80 mg/dL (ref 0–189)

## 2023-12-27 MED ORDER — AMOXICILLIN 875 MG PO TABS
875.0000 mg | ORAL_TABLET | Freq: Two times a day (BID) | ORAL | 0 refills | Status: AC
Start: 2023-12-27 — End: 2024-01-01

## 2023-12-27 MED ORDER — DICLOFENAC SODIUM 3 % EX GEL
2.0000 g | Freq: Two times a day (BID) | CUTANEOUS | 2 refills | Status: AC
Start: 2023-12-27 — End: 2024-03-26

## 2023-12-27 MED ORDER — CARBAMIDE PEROXIDE 6.5 % OT SOLN
OTIC | 0 refills | Status: AC
Start: 2023-12-27 — End: 2023-12-31

## 2023-12-27 NOTE — Assessment & Plan Note (Signed)
Unable to visualize full TM d/t excessive cerumen. Recent AOM in December--treated with Augmentin. Today with erythema/mild bulge to visible portion. Suspect recurrent AOM. Agrees treatment with amox and use of debrox to clear excessive wax. Visit scheduled for next week with writer for re-examination and possible ear lavage at that time. Return precautions discussed with patient. Patient verbalizes understanding and is in agreement with plan.

## 2023-12-27 NOTE — Assessment & Plan Note (Signed)
Hx of neck pain x 2 days. Atraumatic. Was using diclofenac 3% from previous ED visit with good relief. Significant tension over trapezius region today. Refills provided. Can consider PT vs muscle relaxant if persistent. Has visit with Clinical research associate for next week.

## 2023-12-27 NOTE — Progress Notes (Signed)
SUBJECTIVE:    Samantha Cameron a 68 year old female patient of Jamey Reas., MD presents for multiple concerns    HPI:  #R neck/shoulder pain  -sxs began 2 days ago  -located : R side  -no incidents/injury  -had old diclofenac 3% cream from previous ED visit--at end of tube--finds this helpful        #left ear pain  -last discussed during OV 11/11/23  -found to have otitis media--was prescribed Augmentin  -feels like she has ongoing ear pain, muffled hearing    -no fevers    #DM  HEMOGLOBIN A1C (%)   Date Value   06/24/2023 6.4 (H)   03/01/2023 6.5 (H)   09/27/2022 6.4 (H)       No results found for: "POCA1C"        #SVT  -missed recent cardiology visit 12/16/2023     Dept Phone: [phone]     REVIEW OF SYSTEMS: negative or noted above      OBJECTIVE:  BP 92/67 (Site: LA, Position: Sitting, Cuff Size: Lrg)   Pulse 113   Temp 99.3 F (37.4 C) (Temporal)   Wt 99.7 kg (219 lb 12.8 oz)   LMP 10/22/2007 (LMP Unknown)   SpO2 98%   BMI 40.20 kg/m   Gen: Well-appearing, speaking normally  HEENT: Head atraumatic, Left TM with excessive cerumen, only 30% of TM visualized--erythematous, ? Mild bulge,  moist mucous membranes, normal conjunctiva  CV: regular rate and rhythm, no murmurs/rubs/gallops  Pulm: No increased work of breathing, lungs clear to auscultation, no wheezes/rales/rhonchi  MSK: TTP over R trapezius  Skin: warm and well perfused  Neuro: Alert and oriented, no gross focal motor deficits  Psych: Normal speech and thought process        ASSESSMENT AND PLAN:  Type 2 diabetes mellitus with microalbuminuria, without long-term current use of insulin (HCC)  Hx of DMT2. Last A1c well controlled at 6.4. Agrees to repeat today. Current meds include metformin 1000 mg with breakfast.Plan to continue current regimen.  Return precautions discussed with patient. Patient verbalizes understanding and is in agreement with plan.      Recurrent AOM (acute otitis media)  Unable to visualize full TM d/t excessive  cerumen. Recent AOM in December--treated with Augmentin. Today with erythema/mild bulge to visible portion. Suspect recurrent AOM. Agrees treatment with amox and use of debrox to clear excessive wax. Visit scheduled for next week with writer for re-examination and possible ear lavage at that time. Return precautions discussed with patient. Patient verbalizes understanding and is in agreement with plan.      Acute neck pain  Hx of neck pain x 2 days. Atraumatic. Was using diclofenac 3% from previous ED visit with good relief. Significant tension over trapezius region today. Refills provided. Can consider PT vs muscle relaxant if persistent. Has visit with Clinical research associate for next week.         2. Other hyperlipidemia  The 10-year ASCVD risk score (Arnett DK, et al., 2019) is: 17.9%  Agrees to repeat  On atorva 40 mg already  - CHOLESTEROL  - HIGH DENSITY LIPOPROTEIN  - LOW DENSITY LIPOPROTEIN DIRECT    3. NSVT (nonsustained ventricular tachycardia) (HCC)  Missed visit with cardiology, phone number provided in AVS to reschedule      I spent a total of 31 minutes on this visit on the date of service (total time includes all activities performed on the date of service)  We discussed the patient's current medications. The patient expressed understanding and no barriers to adherence were identified.   1. The patient indicates understanding of these issues and agrees with the plan. Brief care plan is updated and reviewed with the patient.   2. The patient is given an After Visit Summary sheet that lists all medications with directions, allergies, orders placed during this encounter, and follow-up instructions.   3. I reviewed the patient's medical information and medical history   4. I reconciled the patient's medication list and prepared and supplied needed refills.   5. I have reviewed the past medical, family, and social history sections including the medications and allergies.  Clista Bernhardt, PA-C

## 2023-12-27 NOTE — Assessment & Plan Note (Signed)
Hx of DMT2. Last A1c well controlled at 6.4. Agrees to repeat today. Current meds include metformin 1000 mg with breakfast.Plan to continue current regimen.  Return precautions discussed with patient. Patient verbalizes understanding and is in agreement with plan.

## 2023-12-27 NOTE — Patient Instructions (Addendum)
Samantha Cameron,     Reschedule your visit with the heart doctor when you get the chance!    Dept Phone: [phone]     -Joni Reining

## 2023-12-31 ENCOUNTER — Ambulatory Visit
Payer: Medicare Other | Attending: Internal Medicine | Admitting: Student in an Organized Health Care Education/Training Program

## 2023-12-31 ENCOUNTER — Other Ambulatory Visit: Payer: Self-pay

## 2023-12-31 ENCOUNTER — Encounter (HOSPITAL_BASED_OUTPATIENT_CLINIC_OR_DEPARTMENT_OTHER): Payer: Self-pay | Admitting: Student in an Organized Health Care Education/Training Program

## 2023-12-31 VITALS — BP 124/69 | HR 106 | Temp 97.8°F

## 2023-12-31 DIAGNOSIS — R002 Palpitations: Secondary | ICD-10-CM | POA: Insufficient documentation

## 2023-12-31 DIAGNOSIS — R000 Tachycardia, unspecified: Secondary | ICD-10-CM | POA: Insufficient documentation

## 2023-12-31 DIAGNOSIS — H6122 Impacted cerumen, left ear: Secondary | ICD-10-CM | POA: Diagnosis present

## 2023-12-31 DIAGNOSIS — R809 Proteinuria, unspecified: Secondary | ICD-10-CM | POA: Insufficient documentation

## 2023-12-31 DIAGNOSIS — E1129 Type 2 diabetes mellitus with other diabetic kidney complication: Secondary | ICD-10-CM | POA: Diagnosis present

## 2023-12-31 DIAGNOSIS — G43009 Migraine without aura, not intractable, without status migrainosus: Secondary | ICD-10-CM | POA: Diagnosis present

## 2023-12-31 LAB — MICROALBUMIN RANDOM URINE
ALB/CREAT RATIO URINE RAN: 835 ug/mg — ABNORMAL HIGH (ref 0–30)
ALBUMIN URINE RANDOM: 111 mg/dL — ABNORMAL HIGH (ref 0.0–1.9)
CREATININE RANDOM URINE: 133 mg/dL (ref 28–217)

## 2023-12-31 MED ORDER — METOPROLOL SUCCINATE ER 25 MG PO TB24
25.0000 mg | ORAL_TABLET | Freq: Every day | ORAL | 3 refills | Status: AC
Start: 2023-12-31 — End: 2024-12-30

## 2023-12-31 MED ORDER — METFORMIN HCL ER 500 MG PO TB24: 1000.0000 mg | ORAL_TABLET | Freq: Every day | ORAL | 3 refills | Status: DC

## 2023-12-31 MED ORDER — OMEPRAZOLE 20 MG PO CPDR
20.0000 mg | DELAYED_RELEASE_CAPSULE | Freq: Every day | ORAL | 3 refills | Status: AC
Start: 2023-12-31 — End: 2024-12-31

## 2023-12-31 MED ORDER — SUMATRIPTAN SUCCINATE 50 MG PO TABS
50.0000 mg | ORAL_TABLET | ORAL | 0 refills | Status: DC | PRN
Start: 2023-12-31 — End: 2024-06-29

## 2023-12-31 NOTE — Progress Notes (Signed)
SUBJECTIVE:    Samantha Cameron a 68 year old female patient of Jamey Reas., MD presents for multiple concerns    HPI:  #ear concern  -last discussed during OV 2/7  -plan at that time:  "Recurrent AOM (acute otitis media)  Unable to visualize full TM d/t excessive cerumen. Recent AOM in December--treated with Augmentin. Today with erythema/mild bulge to visible portion. Suspect recurrent AOM. Agrees treatment with amox and use of debrox to clear excessive wax. Visit scheduled for next week with writer for re-examination and possible ear lavage at that time.   "  Today, endorsing ongoing ear pain  -feels like hearing is muffled  +tinnitus  -no ear discharge  -no fevers        #DM  -labs obtained 2/7  HEMOGLOBIN A1C (%)   Date Value   12/27/2023 6.6 (H)   06/24/2023 6.4 (H)   03/01/2023 6.5 (H)       No results found for: "POCA1C"        REVIEW OF SYSTEMS: negative or noted above      OBJECTIVE:  BP 124/69   Pulse 106   Temp 97.8 F (36.6 C) (Oral)   LMP 10/22/2007 (LMP Unknown)   SpO2 95%   Gen: Well-appearing, speaking normally  HEENT: Head atraumatic, Left TM with impacted cerumen obscuring 80% of canal  CV: regular rate and rhythm,  Pulm: No increased work of breathing  Skin: warm and well perfused  Neuro: Alert and oriented, no gross focal motor deficits  Psych: Normal speech and thought process    HEENT exam post irrigation: Left TM visualized/erythematous, non-bulging, EAC erythematous with scant water, free of debris         ASSESSMENT AND PLAN:  Excessive cerumen in ear canal, left  Completed ear irrigation with hydrogen peroxide/warm water solution and curretage. Required 2 rounds. TM visualized and clear of debris. Encouraged use of debrox monthly to prevent excessive cerumen build up. Offered ENT referral given 2 recent episodes of otitis. Declines at this time. If pain/tinnitus continues--she will contact clinic for ENT referral at that time. Return precautions discussed with patient.  Patient verbalizes understanding and is in agreement with plan.      Migraine without aura and without status migrainosus, not intractable  Hx of migraines. Well controlled at this time. Rare sumatriptan use. Refills provided. Plan to continue current regimen.     Type 2 diabetes mellitus with microalbuminuria, without long-term current use of insulin (HCC)  Hx of DMT2 and obesity/albuminuria. Current BMI 40.20. A1c obtained last visit increased from 6.4 >>6.6. Still at goal of <7.0. Reviewed results with patient today. Agrees to leave urine for microalbumin.  Current meds include metformin 1000 mg with breakfast.Plan to continue current regimen. Plan to RTC in 6 mos for DM follow up.  Return precautions discussed with patient. Patient verbalizes understanding and is in agreement with plan.      Tachycardia  HR elevated today. Unable to address today. Should monitor at future visits. On metoprolol from cardiology.     Palpitations  Previously followed by cardiology, no visit for for >1 year. Refill of metoprolol provided.       I spent a total of 42 minutes on this visit on the date of service (total time includes all activities performed on the date of service)      We discussed the patient's current medications. The patient expressed understanding and no barriers to adherence were identified.   1. The patient  indicates understanding of these issues and agrees with the plan. Brief care plan is updated and reviewed with the patient.   2. The patient is given an After Visit Summary sheet that lists all medications with directions, allergies, orders placed during this encounter, and follow-up instructions.   3. I reviewed the patient's medical information and medical history   4. I reconciled the patient's medication list and prepared and supplied needed refills.   5. I have reviewed the past medical, family, and social history sections including the medications and allergies.  Clista Bernhardt, PA-C

## 2023-12-31 NOTE — Assessment & Plan Note (Signed)
Previously followed by cardiology, no visit for for >1 year. Refill of metoprolol provided.

## 2023-12-31 NOTE — Assessment & Plan Note (Signed)
HR elevated today. Unable to address today. Should monitor at future visits. On metoprolol from cardiology.

## 2023-12-31 NOTE — Assessment & Plan Note (Signed)
Hx of migraines. Well controlled at this time. Rare sumatriptan use. Refills provided. Plan to continue current regimen.

## 2023-12-31 NOTE — Assessment & Plan Note (Signed)
Completed ear irrigation with hydrogen peroxide/warm water solution and curretage. Required 2 rounds. TM visualized and clear of debris. Encouraged use of debrox monthly to prevent excessive cerumen build up. Offered ENT referral given 2 recent episodes of otitis. Declines at this time. If pain/tinnitus continues--she will contact clinic for ENT referral at that time. Return precautions discussed with patient. Patient verbalizes understanding and is in agreement with plan.

## 2023-12-31 NOTE — Assessment & Plan Note (Signed)
Hx of DMT2 and obesity/albuminuria. Current BMI 40.20. A1c obtained last visit increased from 6.4 >>6.6. Still at goal of <7.0. Reviewed results with patient today. Agrees to leave urine for microalbumin.  Current meds include metformin 1000 mg with breakfast.Plan to continue current regimen. Plan to RTC in 6 mos for DM follow up.  Return precautions discussed with patient. Patient verbalizes understanding and is in agreement with plan.

## 2024-01-02 DIAGNOSIS — M5416 Radiculopathy, lumbar region: Secondary | ICD-10-CM | POA: Diagnosis not present

## 2024-01-02 DIAGNOSIS — M4727 Other spondylosis with radiculopathy, lumbosacral region: Secondary | ICD-10-CM | POA: Diagnosis not present

## 2024-01-02 DIAGNOSIS — M25561 Pain in right knee: Secondary | ICD-10-CM | POA: Diagnosis not present

## 2024-01-03 ENCOUNTER — Other Ambulatory Visit (HOSPITAL_BASED_OUTPATIENT_CLINIC_OR_DEPARTMENT_OTHER): Payer: Self-pay | Admitting: Student in an Organized Health Care Education/Training Program

## 2024-01-03 ENCOUNTER — Encounter (HOSPITAL_BASED_OUTPATIENT_CLINIC_OR_DEPARTMENT_OTHER): Payer: Self-pay | Admitting: Student in an Organized Health Care Education/Training Program

## 2024-01-03 ENCOUNTER — Telehealth (HOSPITAL_BASED_OUTPATIENT_CLINIC_OR_DEPARTMENT_OTHER): Payer: Self-pay

## 2024-01-03 DIAGNOSIS — E1129 Type 2 diabetes mellitus with other diabetic kidney complication: Secondary | ICD-10-CM

## 2024-01-03 NOTE — Telephone Encounter (Addendum)
Patient identification verified by 2 forms.    Called placed to patient relayed message below.   Provided number to referrals  [phone].  Patient verbalized understanding and agreed with plan.   Denied any further questions or concerns at this time.     ----- Message from Clista Bernhardt sent at 01/03/2024  4:22 PM EST -----  Dear RN,     Please:    1. Create Telephone encounter for this patient.  2. Share with the patient     "Hi Samantha Cameron,     Thanks for doing the urine test. It did show a large amount of protein in your urine (in a similar range as last time). We typically use a medication called lisinopril to help with this--but I know you cannot take this family of medication due to having a reaction in the past.  I want you to meet with a kidney specialist so we can decide what alternative medication may be best"    Plan:  1. Nephro referral if agreeable     2. Type of Outreach: 3 phone calls and if unable to reach send letter     3. Document the conversation in the Telephone Encounter and send the encounter back to me to complete orders and any further necessary documentation.      Thank you,  Clista Bernhardt, PA-C

## 2024-01-09 DIAGNOSIS — M5417 Radiculopathy, lumbosacral region: Secondary | ICD-10-CM | POA: Diagnosis not present

## 2024-01-14 ENCOUNTER — Telehealth (HOSPITAL_BASED_OUTPATIENT_CLINIC_OR_DEPARTMENT_OTHER): Payer: Self-pay

## 2024-01-14 NOTE — Progress Notes (Signed)
 Left message in patient's voicemail.     Confirmed patient's procedure at [facility] on 01/24/24 at  11am.      Ride home requirement reviewed.      Asked patient to call [phone] or [phone] with any questions or cancellation request.      EGD prep instructions sent via mail.     Reviewed medications and no SGLT-2 or GLP-1 medications noted.     DM Metformin instructions included.

## 2024-01-16 ENCOUNTER — Encounter (HOSPITAL_BASED_OUTPATIENT_CLINIC_OR_DEPARTMENT_OTHER): Payer: Self-pay | Admitting: Ophthalmology

## 2024-01-16 ENCOUNTER — Ambulatory Visit: Payer: Medicare Other | Attending: Ophthalmology | Admitting: Ophthalmology

## 2024-01-16 ENCOUNTER — Other Ambulatory Visit: Payer: Self-pay

## 2024-01-16 DIAGNOSIS — H5213 Myopia, bilateral: Secondary | ICD-10-CM | POA: Insufficient documentation

## 2024-01-16 DIAGNOSIS — E119 Type 2 diabetes mellitus without complications: Secondary | ICD-10-CM | POA: Insufficient documentation

## 2024-01-16 DIAGNOSIS — H524 Presbyopia: Secondary | ICD-10-CM | POA: Insufficient documentation

## 2024-01-16 DIAGNOSIS — H43813 Vitreous degeneration, bilateral: Secondary | ICD-10-CM | POA: Diagnosis present

## 2024-01-16 DIAGNOSIS — H52203 Unspecified astigmatism, bilateral: Secondary | ICD-10-CM | POA: Diagnosis present

## 2024-01-16 NOTE — Progress Notes (Signed)
 For diabetic eye exam to evaluate for retinopathy    HEMOGLOBIN A1C (%)   Date Value   12/27/2023 6.6 (H)   06/24/2023 6.4 (H)   03/01/2023 6.5 (H)       No results found for: "POCA1C"    Impression,    1. AODM (Metformin) No gross retinopathy    Fundus photos-No hemorrhages or exudates/no diabetic retinopathy          2. Pseudophakia OU-stable, good vision    3. PVD/vitreous floater OU    Reminded er to RTC if more floaters or any flashers     4. Myopia, astigmatism, presbyopia
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
## 2024-01-16 NOTE — Progress Notes (Signed)
 Here for Diabetic Annual Eye Exam,H/O DM-no retinopathy,Pseudophakia OU(OD 01/24/2022,OS 03/09/2020),Myopia,Astigmatism,Presbyopia.  Macula OCT and Fundus photo done today OU.   Vision is stable OU.   Seeing floaters OU.   No pain.   No flashes/floaters.  HEMOGLOBIN A1C (%)   Date Value   12/27/2023 6.6 (H)   06/24/2023 6.4 (H)   03/01/2023 6.5 (H)       No results found for: "POCA1C"   Ocular meds   None

## 2024-01-17 ENCOUNTER — Ambulatory Visit: Admission: RE | Admit: 2024-01-17 | Discharge: 2024-01-17 | Disposition: A | Discharge status: Home / Self Care

## 2024-01-17 ENCOUNTER — Ambulatory Visit (HOSPITAL_BASED_OUTPATIENT_CLINIC_OR_DEPARTMENT_OTHER): Payer: Medicare Other

## 2024-01-17 ENCOUNTER — Other Ambulatory Visit: Payer: Self-pay

## 2024-01-17 VITALS — BP 121/76 | HR 100 | Temp 97.4°F | Wt 225.0 lb

## 2024-01-17 DIAGNOSIS — R801 Persistent proteinuria, unspecified: Secondary | ICD-10-CM | POA: Insufficient documentation

## 2024-01-17 DIAGNOSIS — I10 Essential (primary) hypertension: Secondary | ICD-10-CM

## 2024-01-17 DIAGNOSIS — Z6841 Body Mass Index (BMI) 40.0 and over, adult: Secondary | ICD-10-CM | POA: Diagnosis not present

## 2024-01-17 LAB — URINALYSIS
BACTERIA, URINE: NONE SEEN [HPF]
BILIRUBIN, URINE: NEGATIVE
GLUCOSE, URINE: NEGATIVE mg/dL
KETONE, URINE: NEGATIVE mg/dL
LEUKOCYTES, URINE: NEGATIVE
NITRITE, URINE: NEGATIVE
OCCULT BLOOD, URINE: NEGATIVE
PH, URINE: 5 (ref 5.0–8.0)
PROTEIN, URINE: 100 mg/dL — AB
SPECIFIC GRAVITY, URINE: 1.017 (ref 1.003–1.035)
UROBILINOGEN, URINE: 0.2 U/dL (ref 0.2–1.0)

## 2024-01-17 LAB — BASIC METABOLIC PANEL
ANION GAP: 15 mmol/L (ref 10–22)
BUN (UREA NITROGEN): 12 mg/dL (ref 7–18)
CALCIUM: 9.4 mg/dL (ref 8.5–10.5)
CARBON DIOXIDE: 21 mmol/L (ref 21–32)
CHLORIDE: 104 mmol/L (ref 98–107)
CREATININE: 0.7 mg/dL (ref 0.4–1.2)
ESTIMATED GLOMERULAR FILT RATE: >60 mL/min (ref >60)
Glucose Random: 118 mg/dL (ref 74–160)
POTASSIUM: 4.4 mmol/L (ref 3.5–5.1)
SODIUM: 140 mmol/L (ref 136–145)

## 2024-01-17 LAB — MICROALBUMIN RANDOM URINE
ALB/CREAT RATIO URINE RAN: 630 ug/mg — ABNORMAL HIGH (ref 0–30)
ALBUMIN URINE RANDOM: 64.9 mg/dL — ABNORMAL HIGH (ref 0.0–1.9)

## 2024-01-17 LAB — PROTEIN/CREAT URINE RANDOM
CREATININE RANDOM URINE: 103 mg/dL (ref 28–217)
PROT/CREAT RATIO URINE RANDOM: 0.93 mg/mg{creat} — ABNORMAL HIGH (ref 0.00–0.15)
TOTAL PROTEIN RANDOM URINE: 96 mg/dL — ABNORMAL HIGH (ref 0–15)

## 2024-01-20 ENCOUNTER — Other Ambulatory Visit (HOSPITAL_BASED_OUTPATIENT_CLINIC_OR_DEPARTMENT_OTHER): Payer: Self-pay

## 2024-01-20 ENCOUNTER — Telehealth (HOSPITAL_BASED_OUTPATIENT_CLINIC_OR_DEPARTMENT_OTHER): Payer: Self-pay

## 2024-01-20 DIAGNOSIS — R801 Persistent proteinuria, unspecified: Secondary | ICD-10-CM

## 2024-01-20 MED ORDER — SPIRONOLACTONE 25 MG PO TABS
25.0000 mg | ORAL_TABLET | Freq: Every day | ORAL | 11 refills | Status: AC
Start: 2024-01-20 — End: 2025-01-19

## 2024-01-20 NOTE — Telephone Encounter (Addendum)
 Spoke with patient  Message discussed  Understand message and agrees with plan          Purvis Kilts, MD  [facility]  Dear RN,    Please:    1. Create a Telephone encounter for this patient.  2. Share with the patient the attached results. Stable kidney function. The urine protein remains elevated. Please start spironolactone. Please have a f/u lab in 2 weeks.    Plan:  1. Further tests are needed, future labs will be ordered and patient needs to return to have them done in 2 week(s).  A prescription for spironolactone will be written. .    2. Type of Outreach: 3 phone calls and if unable to reach send letter    3. Document the conversation in the Telephone Encounter and close the encounter, no need to send back to me.    Thank you,  Purvis Kilts, MD

## 2024-01-20 NOTE — Progress Notes (Signed)
 Dear RN,    Please:    1. Create a Telephone encounter for this patient.  2. Share with the patient the attached results. Stable kidney function. The urine protein remains elevated. Please start spironolactone. Please have a f/u lab in 2 weeks.    Plan:  1. Further tests are needed, future labs will be ordered and patient needs to return to have them done in 2 week(s).  A prescription for spironolactone will be written. .    2. Type of Outreach: 3 phone calls and if unable to reach send letter    3. Document the conversation in the Telephone Encounter and close the encounter, no need to send back to me.     Thank you,  Purvis Kilts, MD

## 2024-01-24 ENCOUNTER — Encounter (HOSPITAL_BASED_OUTPATIENT_CLINIC_OR_DEPARTMENT_OTHER): Payer: Self-pay | Admitting: Gastroenterology

## 2024-01-24 ENCOUNTER — Ambulatory Visit
Admission: RE | Admit: 2024-01-24 | Discharge: 2024-01-24 | Disposition: A | Discharge status: Home / Self Care | Attending: Gastroenterology | Admitting: Gastroenterology

## 2024-01-24 ENCOUNTER — Ambulatory Visit (HOSPITAL_BASED_OUTPATIENT_CLINIC_OR_DEPARTMENT_OTHER): Admitting: Certified Registered"

## 2024-01-24 ENCOUNTER — Other Ambulatory Visit: Payer: Self-pay

## 2024-01-24 ENCOUNTER — Ambulatory Visit (HOSPITAL_BASED_OUTPATIENT_CLINIC_OR_DEPARTMENT_OTHER)
Admission: RE | Disposition: A | Discharge status: Home / Self Care | Payer: Self-pay | Source: Ambulatory Visit | Attending: Gastroenterology

## 2024-01-24 DIAGNOSIS — K2950 Unspecified chronic gastritis without bleeding: Secondary | ICD-10-CM | POA: Insufficient documentation

## 2024-01-24 DIAGNOSIS — K219 Gastro-esophageal reflux disease without esophagitis: Secondary | ICD-10-CM | POA: Diagnosis not present

## 2024-01-24 DIAGNOSIS — I10 Essential (primary) hypertension: Secondary | ICD-10-CM | POA: Diagnosis not present

## 2024-01-24 DIAGNOSIS — Z87891 Personal history of nicotine dependence: Secondary | ICD-10-CM | POA: Insufficient documentation

## 2024-01-24 DIAGNOSIS — Z96652 Presence of left artificial knee joint: Secondary | ICD-10-CM | POA: Diagnosis not present

## 2024-01-24 DIAGNOSIS — E785 Hyperlipidemia, unspecified: Secondary | ICD-10-CM | POA: Insufficient documentation

## 2024-01-24 DIAGNOSIS — R1013 Epigastric pain: Secondary | ICD-10-CM | POA: Insufficient documentation

## 2024-01-24 DIAGNOSIS — K3189 Other diseases of stomach and duodenum: Secondary | ICD-10-CM

## 2024-01-24 DIAGNOSIS — Z79891 Long term (current) use of opiate analgesic: Secondary | ICD-10-CM | POA: Diagnosis not present

## 2024-01-24 DIAGNOSIS — K2100 Gastro-esophageal reflux disease with esophagitis, without bleeding: Secondary | ICD-10-CM | POA: Diagnosis not present

## 2024-01-24 DIAGNOSIS — J449 Chronic obstructive pulmonary disease, unspecified: Secondary | ICD-10-CM | POA: Insufficient documentation

## 2024-01-24 DIAGNOSIS — G4733 Obstructive sleep apnea (adult) (pediatric): Secondary | ICD-10-CM | POA: Diagnosis not present

## 2024-01-24 DIAGNOSIS — E119 Type 2 diabetes mellitus without complications: Secondary | ICD-10-CM | POA: Diagnosis not present

## 2024-01-24 DIAGNOSIS — Z9981 Dependence on supplemental oxygen: Secondary | ICD-10-CM | POA: Diagnosis not present

## 2024-01-24 DIAGNOSIS — R12 Heartburn: Secondary | ICD-10-CM

## 2024-01-24 DIAGNOSIS — Z7984 Long term (current) use of oral hypoglycemic drugs: Secondary | ICD-10-CM | POA: Diagnosis not present

## 2024-01-24 DIAGNOSIS — Z6841 Body Mass Index (BMI) 40.0 and over, adult: Secondary | ICD-10-CM | POA: Insufficient documentation

## 2024-01-24 LAB — BLOOD SUGAR FINGERSTICK (POINT OF CARE): FINGERSTICK GLUCOSE: 119 mg/dL (ref 74–160)

## 2024-01-24 SURGERY — EGD (ESOPHAGOGASTRODUODENOSCOPY)
Anesthesia: General

## 2024-01-24 MED ORDER — LIDOCAINE HCL (PF) 2 % IJ SOLN
Freq: Once | INTRAMUSCULAR | Status: DC | PRN
Start: 2024-01-24 — End: 2024-01-24
  Administered 2024-01-24: 100 mg via INTRAVENOUS

## 2024-01-24 MED ORDER — PROPOFOL 500 MG/50 ML IV
Freq: Once | INTRAVENOUS | Status: DC | PRN
Start: 2024-01-24 — End: 2024-01-24
  Administered 2024-01-24: 100 ug/kg/min via INTRAVENOUS
  Administered 2024-01-24: 100 mg via INTRAVENOUS

## 2024-01-24 MED ORDER — LABETALOL HCL 5 MG/ML IV SOLN
5.0000 mg | Freq: Once | INTRAVENOUS | Status: AC
Start: 2024-01-24 — End: 2024-01-24
  Administered 2024-01-24: 5 mg via INTRAVENOUS
  Filled 2024-01-24: qty 20

## 2024-01-24 MED ORDER — LIDOCAINE HCL (PF) 2 % IJ SOLN
INTRAMUSCULAR | Status: AC
Start: 2024-01-24 — End: 2024-01-24
  Filled 2024-01-24: qty 5

## 2024-01-24 MED ORDER — PROPOFOL INFUSION
INTRAVENOUS | Status: AC
Start: 2024-01-24 — End: 2024-01-24
  Filled 2024-01-24: qty 50

## 2024-01-24 MED ORDER — LACTATED RINGERS IV SOLN
INTRAVENOUS | Status: DC
Start: 2024-01-24 — End: 2024-01-24
  Administered 2024-01-24: 500 mL via INTRAVENOUS

## 2024-01-24 SURGICAL SUPPLY — 10 items
.9% NACL IRRIGATION 500ML (SOLUTION) ×1 IMPLANT
CUSTOM ENDO KIT (PACK) ×1 IMPLANT
DISPOSABLE BITE BLOCK (DENTAL) ×1 IMPLANT
ELECTROSURGICAL GROUNDING PAD (CAUTERY) IMPLANT
ORCA ENDO VALVE (ENDOVLVE) ×1 IMPLANT
RADIAL JAW 4 LG W/NDL 160CM (ENDOCLMP) IMPLANT
STERILE WATER IRR. 500ML (SOLUTION) ×1 IMPLANT
SUCTION CANISTER 1200CC 484410 (SUCTION) ×1 IMPLANT
SYRINGE SLIP TIP 60CC (SYRINGE) IMPLANT
XL ISOLATION GOWN YELLOW (PPEGOWNS) ×3 IMPLANT
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
## 2024-01-24 NOTE — PROVATION-GI (Signed)
 [facility]  Patient Name: Samantha Cameron  MRN: [id number]  CSN: [id number]  Date of Birth: 1956-05-25  Admit Type: Outpatient  Age: 68  Gender: Female  Note Status: Finalized  Patient Location: [facility] OR 2  Referring MD:          Jamey Reas MD, MD  Procedure Date:        01/24/2024 9:16:28 AM  Procedure:             Upper GI endoscopy  Endoscopist:           Joretta Bachelor MD, MD  Indications for Procedure:       Dyspepsia, Heartburn  Medications:           Monitored Anesthesia Care  Procedure:       Just prior to the procedure, an updated history and physical was done. I        obtained an informed consent from the patient reviewing the risk of the        procedure including (but not limited to) respiratory depression, perforation,        bleeding, discomfort, a possible need for surgery and unexpected reactions to        medications. The patient is aware that test has limitations and may not        detect significant lesions such as cancer or other potential diseases. The        patient was also informed that they might need a repeat upper endoscopy        earlier than standard guidelines if there are changes in their symptoms or        concerning findings noted. A time out was performed with the entire procedure        staff present. The scope was passed under direct vision. Throughout the        procedure, the patient's blood pressure, pulse, and oxygen saturations were        monitored continuously. The Endosonoscope was introduced through the mouth,        and advanced to the third part of duodenum. The upper GI endoscopy was        accomplished without difficulty. The patient tolerated the procedure well.  Scope In: 9:58:16 AM  Scope Out: 10:08:56 AM  Total Procedure Duration: 0 hours 10 minutes 40 seconds   Findings:       No gross lesions were noted in the entire esophagus.       Diffuse moderate inflammation characterized by erythema and friability was        found in the gastric  antrum. Biopsies were taken with a cold forceps for        histology.       Diffuse mucosal flattening was found in the second portion of the duodenum        and in the third portion of the duodenum. Biopsies for histology were taken        with a cold forceps for evaluation of celiac disease.  Post Procedure Diagnosis:       - No gross lesions in the entire esophagus.       - Chronic gastritis. Biopsied.       - Flattened mucosa was found in the duodenum, suspicious for celiac disease.        Biopsied.  Complications:         No immediate complications.  Recommendation:       - Await pathology  results.  Joretta Bachelor., MD  Clerance Lav Gwyneth Sprout MD, MD  01/24/2024 10:27:14 AM  This report has been signed electronically.  Number of Addenda: 0  Note Initiated On: 01/24/2024 9:16 AM

## 2024-01-24 NOTE — Anesthesia Preprocedure Evaluation (Signed)
 Pre-Anesthetic Note  .      Patient: Samantha Cameron is a 68 year old female      Procedure Information       Date/Time: 01/24/24 0840    Procedure: EGD (ESOPHAGOGASTRODUODENOSCOPY)    Diagnosis: Gastroesophageal reflux disease with esophagitis without hemorrhage [K21.00]    Pre-op diagnosis: Gastroesophageal reflux disease with esophagitis without hemorrhage [K21.00]    Location: [facility]    Surgeons: Joretta Bachelor., MD            Relevant Problems   ANESTHESIA   (+) OSA (obstructive sleep apnea)      PULMONARY   (+) DOE (dyspnea on exertion)   (+) OSA (obstructive sleep apnea)      NEURO/PSYCH   (+) Migraine without aura and without status migrainosus, not intractable      CARDIO   (+) Atherosclerosis of aorta (HCC)   (+) DOE (dyspnea on exertion)   (+) First degree AV block   (+) Internal hemorrhoids   (+) Migraine without aura and without status migrainosus, not intractable   (+) PAC (premature atrial contraction)   (+) Primary hypertension   (+) Vascular calcification   (+) Venous (peripheral) insufficiency      GI   (+) Gastroesophageal reflux disease      GU/RENAL   (+) Fatty liver      ENDO   (+) Type 2 diabetes mellitus with microalbuminuria, without long-term current use of insulin (HCC)           Previous Anesthetic History:   Past Surgical History:  No date: CATARACT EXTRACTION EXTRACAPSULAR W/ INTRAOCULAR LENS   IMPLANTATION      Comment:  OS 03/10/20, OD 01/24/22 Dr.S.M.Patalano  No date: CHOLECYSTECTOMY  No date: FOOT SURGERY      Comment:  bilateral  No date: LAPAROSCOPY SURG CHOLECYSTECTOMY  No date: OB ANTEPARTUM CARE CESAREAN DLVR & POSTPARTUM      Comment:  x3  No date: PR ANES NERVE MUSC TENDON FASCIA&BURSA KNEE&/POPLT  No date: TONSILLECTOMY & ADENOIDECTOMY <AGE 51  04/13/2009: TOTAL KNEE REPLACEMENT      Comment:  left  08/11/2009: WRIST GANGLION EXCISION      Comment:  left         Medications  Current Facility-Administered Medications   Medication    lactated ringers infusion     labetalol (NORMODYNE) injection 5 mg         Allergies:   Review of Patient's Allergies indicates:   Ace inhibitors          Swelling   Codeine camsylate       Shortness of Breath, Other (See                            Comments)    Comment:Chest pain   Lisinopril              Anaphylaxis   Motrin [ibuprofen]      Nausea Only   Sulfa antibiotics       Other (See Comments)   Bactrim                 Rash, Itching    Smoking, Alcohol, Drugs:  Social History    Tobacco Use      Smoking status: Every Day        Packs/day: 0.00        Years: 0.5 packs/day for 31.0 years (  15.5 ttl pk-yrs)        Types: Cigarettes        Start date: 03/11/1987        Last attempt to quit: 03/10/2018        Years since quitting: 5.8      Smokeless tobacco: Never      Tobacco comments: quit smoking inform provided to patient    Alcohol use: No      Drug use:   No       PMHx:  Past Medical History:  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                Continue Lisinopril but will decrease dose as above given               low normal BPs and plan to increase Toprol XL.    No date: Arthritis  No date: Back pain  No date: Bilateral knee pain      Comment:  per steward records, mri - see scanned - tear meniscus                right, politeal cyst, mcl sprain 06/2014, s/p left knee                replacement. 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  No date: Cerumen impaction      Comment:  per steward records  11/18/2018: Chest congestion  02/01/2012: Chest pain  No date: Chronic bronchitis  02/01/2012: Chronic bronchitis with COPD (chronic obstructive   pulmonary disease) (HCC)  09/14/2015: Cough  No date: Depression      Comment:  per steward records  No date: Diabetes  No date:  Disorders of lipoid metabolism  05/23/2018: Epigastric pain      Comment:  05/2018 GI: Assessment: Pt is 40F with PMH COPD, morbid                obesity, H.pylori (2009, unclear if treated) who p/w                severe GERD symptoms, unintentional weight loss (~20 lbs                since 03/2018) and abdominal pain.   1. GERD - severe 2.                Unintentional weight loss 3. Abdominal pain     Plan: 1.                Recommend EGD. This procedure has been fully reviewed                with the patient and written informed consent has been                obtained. 2.   No date: Esophageal reflux  No date: External hemorrhoid      Comment:  per steward records  04/19/2017: Gastritis without bleeding      Comment:  GI endoscopy: 06/2018: Mildly severe reflux esophagitis.               Biopsied.      -  Chronic gastritis. Biopsied.      - No                gross lesions in the entire examined duodenum.                Complications:  No immediate complications.                Recommendation:      - Await pathology results. 7/2019GI:               Assessment: Pt is 2F with PMH COPD, morbid obesity,                H.pylori (2009, unclear if treated) who p/w severe GERD                symptoms, unintentional weigh  05/10/2018: Gastroesophageal reflux disease without esophagitis  No date: HTN (hypertension)  02/01/2012: Hypoxia  04/05/2023: Intractable chronic migraine without aura and with status   migrainosus  02/16/2017: Left ear pain  06/07/2015: Left genital labial abscess  06/16/2015: Obesity, Class III, 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y) (HCC)  No date: Obstructive chronic bronchitis with exacerbation (HCC)  08/16/2015: Osteopenia      Comment:  On xray of left knee per steward records 06/2014 Unclear                if addressed  05/23/2018: Pancreatic lesion  09/14/2015: Right ear impacted cerumen  No date: Sleep apnea  08/15/2015: Type 2 diabetes mellitus without complication, without   long-term current use of  insulin (HCC)      Comment:   HEMOGLOBIN A1C (%) Date Value 07/15/2015 6.2 (H)                ----------  Steward record - a1c 6.2 on 01/31/15 6.4 on                09/30/14 5.9 on 12/29/13 6.4 08/18/13   11/18/2017: Viral URI with cough  No date: Wears eyeglasses    Vitals  BP 127/69   Pulse 112   Temp 97.2 F (36.2 C) (Temporal)   Resp 15   Ht 5\' 2"  (1.575 m)   Wt 101.6 kg (224 lb)   LMP 10/22/2007 (LMP Unknown)   SpO2 93%   BMI 40.97 kg/m     Anesthesia Evaluation    Physical Exam:     General     Level of consciousness:  Alert and oriented (time, person, place)     BMI   BMI greater than 40 kg/m2      Airway     Mallampati:  III    TM distance:  >3 FB    Mouth opening:  >3 FB    Neck ROM:  Full     Teeth       Heart  - normal exam       Lungs - normal exam          HEENT:      Abdomen:        Other findings: None loose per pt.  Smokes "off and on" but not today.                        Pertinent Labs:   Lab Results   Component Value Date    NA 140 01/17/2024    K 4.4 01/17/2024    CREAT 0.7 01/17/2024    GLUCOSER 118 01/17/2024  WBC 11.2 (H) 08/05/2023    HCT 45.6 (H) 08/05/2023    PLTA 284 08/05/2023    PT 12.0 07/21/2023    APTT 30.5 04/18/2017    INR 1.1 07/21/2023       EKG:   Results for orders placed or performed during the hospital encounter of 07/21/23   EKG : Initial    Collection Time: 07/21/23  1:05 AM   Result Value Ref Range    EKG REPORT       Test Reason :     Blood Pressure :   */*   mmHG     Vent. Rate : 113 BPM     Atrial Rate : 113 BPM        P-R Int : 192 ms          QRS Dur :  98 ms         QT Int : 336 ms       P-R-T Axes :  51 -37  42 degrees       QTcB Int : 460 ms          Sinus tachycardia with premature atrial complexes     Left axis deviation     Incomplete right bundle branch block     Inferior infarct (cited on or before 25-Jan-2020)     Abnormal ECG     When compared with ECG of 14-Jun-2023 03:21,     Incomplete right bundle branch block has replaced Right bundle branch  block          Referred By: Deanna Artis            Confirmed By: Warnell Bureau         PFTs:   No results found for this or any previous visit.      Anesthesia Plan    ASA Score:     ASA:  3    Airway:      Mallampati:  III    Mouth opening:  >3 FB    Neck ROM:  Full    TM distance:  >3 FB     Plan: general    Other information:     EKG Reviewed: : Yes      Post-Plan::  PACU    Informed Consent:     Anesthetic plan and risks discussed with:  Patient   Patient Consented

## 2024-01-24 NOTE — Anesthesia Postprocedure Evaluation (Signed)
 Anesthesia Post-Operative Evaluation Note    Patient: Samantha Cameron           Procedure Summary       Date: 01/24/24 Room / Location: [facility] / [facility]    Anesthesia Start: [phone] Anesthesia Stop:     Procedure: EGD (ESOPHAGOGASTRODUODENOSCOPY) Diagnosis:       Gastroesophageal reflux disease with esophagitis without hemorrhage      (Gastroesophageal reflux disease with esophagitis without hemorrhage [K21.00])    Surgeons: Joretta Bachelor., MD Responsible Provider: Rivka Spring, MD    Anesthesia Type: general ASA Status: 3              POST-OPERATIVE EVALUATION    Anesthesia Post Evaluation    Vitals signs in patient's normal range: Yes  Respiratory function stable; airway patent: Yes  Cardiovascular function stable: Yes  Hydration status stable: Yes  Mental status recovered; patient participates in evaluation and/or is at baseline: Yes  Pain control satisfactory: Yes  Nausea and vomiting control satisfactory: YesProcedure was labor & delivery no  PostOP disposition PACU  Anesthesia Observation no significant observation    MIPS#404 Anesthesiology Smoking Abstinence:     The patient is a current smoker (e.g. cigarette, cigar, pipe, e-cigarette/vaping/marijuana) (Q2595): Yes   The patient underwent an elective surgery or procedure requiring anesthesia (G3875) : Yes   The patient received preop smoking cessation instructions prior to the day of surgery or procedure by MD, APC or RN proxy staff [phone]): No     The patient smoked the day of the procedure (R5188): No  FOR CODING USE ONLY: IF BLANK C1660    MIPS#477 Multimodal Pain Management:  Emergent - Exclusion case: No  Patient was administered multimodal pain management (two or more drugs and/or interventions excluding systemic opioids) in the perioperative period; occurring at some time between 6 hours prior to anesthesia start time until discharged from: No   List medical reason(s) patient did not receive multimodal pain management (Y3016): no pain  anticipated postop  FOR CODING USE ONLY: REASON NOT LISTED W1093    ATFT#732 Perioperative Temperature Management:  Anesthesia start to Anesthesia end time was 60 minutes or longer (4256F): No   FOR CODING USE ONLY: REASON NOT LISTED K0254    MIPS#430 Adult Prevention of PONV:  Patient received an inhalational anesthetic (YH062): No  FOR CODING USE ONLY: REASON NOT LISTED B7628    MIPS#463 Pediatric Prevention of PONV:  Pediatric patient?: No  FOR CODING USE ONLY: REASON NOT LISTED B1517        [employer] # [id number]          Last vitals    BP: 127/69 (01/24/2024  9:16 AM)  Temp: 97.2 F (36.2 C) (01/24/2024  9:16 AM)  Pulse: 94 (01/24/2024  9:50 AM)  Resp: 15 (01/24/2024  9:16 AM)  SpO2: 93 % (01/24/2024  9:20 AM)

## 2024-01-27 NOTE — Surgery Post-Op (Signed)
 Procedure(s):  EGD (ESOPHAGOGASTRODUODENOSCOPY)      Pre-op Diagnosis      * Gastroesophageal reflux disease with esophagitis without hemorrhage [K21.00]     Post-op Diagnosis     * Gastroesophageal reflux disease with esophagitis without hemorrhage [K21.00]    Surgeon: Primary: Joretta Bachelor., MD  Resident - Assisting: Augustin Coupe, MD    Assistant (if applicable): * No surgeons found with a matching role *    Type of Anesthesia:  * No anesthesia type entered *      POST OP CALL:        Interpreter used?:  No    Able to speak with Patient:  Yes           Shelba Flake, RN

## 2024-01-27 NOTE — Surgery Post-Op (Signed)
 Procedure(s):  EGD (ESOPHAGOGASTRODUODENOSCOPY)      Pre-op Diagnosis      * Gastroesophageal reflux disease with esophagitis without hemorrhage [K21.00]     Post-op Diagnosis     * Gastroesophageal reflux disease with esophagitis without hemorrhage [K21.00]    Surgeon: Primary: Joretta Bachelor., MD  Resident - Assisting: Augustin Coupe, MD    Assistant (if applicable): * No surgeons found with a matching role *    Type of Anesthesia:  * No anesthesia type entered *      POST OP CALL:        Interpreter used?:  No    Able to speak with Patient:  Yes    Did you get your discharge instructions?: Yes      Do you have any questions about the instructions?: Yes       What did they find on the exam? Explained Dr Manson Passey took biopsies, and will call you or send a letter with an explaination in 1-2 weeks. Patient voiced understanding.    Are you in Pain: No      Taking pain medicine?: No      no fever, , no drainage from dressing and no trouble urinating      All normal      Tolerating Solid Foods: Yes      Any questions for nurse?: No        Post-op appt scheduled? No         Pt reminded to schedule post-op appt? No       Shelba Flake, RN

## 2024-01-29 LAB — SURGICAL PATH SPECIMEN GASTROINTESTINAL

## 2024-01-30 DIAGNOSIS — M4727 Other spondylosis with radiculopathy, lumbosacral region: Secondary | ICD-10-CM | POA: Diagnosis not present

## 2024-01-30 DIAGNOSIS — M5136 Other intervertebral disc degeneration, lumbar region with discogenic back pain only: Secondary | ICD-10-CM | POA: Diagnosis not present

## 2024-02-03 ENCOUNTER — Encounter (HOSPITAL_BASED_OUTPATIENT_CLINIC_OR_DEPARTMENT_OTHER): Payer: Self-pay | Admitting: Gastroenterology

## 2024-02-04 ENCOUNTER — Other Ambulatory Visit: Payer: Self-pay

## 2024-02-04 ENCOUNTER — Ambulatory Visit
Admission: RE | Admit: 2024-02-04 | Discharge: 2024-02-04 | Disposition: A | Discharge status: Home / Self Care | Attending: Gastroenterology | Admitting: Gastroenterology

## 2024-02-04 ENCOUNTER — Telehealth (HOSPITAL_BASED_OUTPATIENT_CLINIC_OR_DEPARTMENT_OTHER): Payer: Self-pay

## 2024-02-04 DIAGNOSIS — R801 Persistent proteinuria, unspecified: Secondary | ICD-10-CM | POA: Diagnosis present

## 2024-02-04 LAB — BASIC METABOLIC PANEL
ANION GAP: 15 mmol/L (ref 10–22)
BUN (UREA NITROGEN): 15 mg/dL (ref 7–18)
CALCIUM: 9.8 mg/dL (ref 8.5–10.5)
CARBON DIOXIDE: 22 mmol/L (ref 21–32)
CHLORIDE: 103 mmol/L (ref 98–107)
CREATININE: 0.7 mg/dL (ref 0.4–1.2)
ESTIMATED GLOMERULAR FILT RATE: >60 mL/min (ref >60)
Glucose Random: 122 mg/dL (ref 74–160)
POTASSIUM: 4.4 mmol/L (ref 3.5–5.1)
SODIUM: 140 mmol/L (ref 136–145)

## 2024-02-04 NOTE — Telephone Encounter (Signed)
-----   Message from Purvis Kilts sent at 02/04/2024  2:59 PM EDT -----  Dear RN,    Please:    1. Create a Telephone encounter for this patient.  2. Share with the patient the attached results.  Kidney function remains stable.  Please continue spironolactone.    Plan:  1. No further tests need to be done. .    2. Type of Outreach: 3 phone calls and if unable to reach send letter    3. Document the conversation in the Telephone Encounter and close the encounter, no need to send back to me.     Thank you,  Purvis Kilts, MD

## 2024-02-04 NOTE — Telephone Encounter (Signed)
 Called  patient  Message  reviewed.  Name  and  DOB  verified.  Marlinda Mike  RN

## 2024-02-04 NOTE — Progress Notes (Signed)
 Dear RN,    Please:    1. Create a Telephone encounter for this patient.  2. Share with the patient the attached results.  Kidney function remains stable.  Please continue spironolactone.    Plan:  1. No further tests need to be done. .    2. Type of Outreach: 3 phone calls and if unable to reach send letter    3. Document the conversation in the Telephone Encounter and close the encounter, no need to send back to me.     Thank you,  Purvis Kilts, MD

## 2024-02-20 ENCOUNTER — Ambulatory Visit: Payer: Medicare Other | Attending: Gastroenterology | Admitting: Gastroenterology

## 2024-02-20 DIAGNOSIS — R748 Abnormal levels of other serum enzymes: Secondary | ICD-10-CM | POA: Insufficient documentation

## 2024-02-20 DIAGNOSIS — K219 Gastro-esophageal reflux disease without esophagitis: Secondary | ICD-10-CM | POA: Diagnosis present

## 2024-02-20 DIAGNOSIS — D126 Benign neoplasm of colon, unspecified: Secondary | ICD-10-CM | POA: Diagnosis not present

## 2024-02-20 DIAGNOSIS — K869 Disease of pancreas, unspecified: Secondary | ICD-10-CM | POA: Insufficient documentation

## 2024-02-20 DIAGNOSIS — F17200 Nicotine dependence, unspecified, uncomplicated: Secondary | ICD-10-CM | POA: Insufficient documentation

## 2024-02-20 DIAGNOSIS — D179 Benign lipomatous neoplasm, unspecified: Secondary | ICD-10-CM | POA: Diagnosis present
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
## 2024-02-27 DIAGNOSIS — M4727 Other spondylosis with radiculopathy, lumbosacral region: Secondary | ICD-10-CM | POA: Diagnosis not present

## 2024-02-27 DIAGNOSIS — M51361 Other intervertebral disc degeneration, lumbar region with lower extremity pain only: Secondary | ICD-10-CM | POA: Diagnosis not present

## 2024-03-30 ENCOUNTER — Other Ambulatory Visit (HOSPITAL_BASED_OUTPATIENT_CLINIC_OR_DEPARTMENT_OTHER): Payer: Self-pay | Admitting: Internal Medicine

## 2024-03-30 DIAGNOSIS — E66813 Obesity, class 3: Secondary | ICD-10-CM

## 2024-03-30 NOTE — Telephone Encounter (Signed)
 PER Pharmacy, Samantha Cameron is a 68 year old female has requested a refill of famotidine .      Last Office Visit: 12/31/2023 Darin Edinger     Last Physical Exam: 08/15/2015    There are no preventive care reminders to display for this patient.    Other Med Adult:  Most Recent BP Reading(s)  01/24/24 : 103/68        Cholesterol (mg/dL)   Date Value   16/08/9603 168     LOW DENSITY LIPOPROTEIN DIRECT (mg/dL)   Date Value   54/07/8118 80     HIGH DENSITY LIPOPROTEIN (mg/dL)   Date Value   14/78/2956 46     TRIGLYCERIDES (mg/dL)   Date Value   21/30/8657 268 (H)         THYROID  SCREEN TSH REFLEX FT4 (uIU/mL)   Date Value   03/15/2023 1.340         No results found for: "TSH"    HEMOGLOBIN A1C (%)   Date Value   12/27/2023 6.6 (H)       No results found for: "POCA1C"      INR (no units)   Date Value   07/21/2023 1.1   04/18/2017 1.0   05/27/2016 1.0       SODIUM (mmol/L)   Date Value   02/04/2024 140       POTASSIUM (mmol/L)   Date Value   02/04/2024 4.4           CREATININE (mg/dL)   Date Value   84/69/6295 0.7       Documented patient preferred pharmacies:    [facility] - [place], [place]  Phone: [phone] Fax: [phone]

## 2024-04-10 ENCOUNTER — Other Ambulatory Visit: Payer: Self-pay

## 2024-04-10 ENCOUNTER — Ambulatory Visit (HOSPITAL_BASED_OUTPATIENT_CLINIC_OR_DEPARTMENT_OTHER)

## 2024-04-10 ENCOUNTER — Ambulatory Visit: Admission: RE | Admit: 2024-04-10 | Discharge: 2024-04-10 | Disposition: A | Discharge status: Home / Self Care

## 2024-04-10 VITALS — BP 137/81 | HR 120 | Temp 97.1°F | Wt 228.0 lb

## 2024-04-10 DIAGNOSIS — E1129 Type 2 diabetes mellitus with other diabetic kidney complication: Secondary | ICD-10-CM | POA: Diagnosis present

## 2024-04-10 DIAGNOSIS — E669 Obesity, unspecified: Secondary | ICD-10-CM | POA: Diagnosis present

## 2024-04-10 DIAGNOSIS — R801 Persistent proteinuria, unspecified: Secondary | ICD-10-CM

## 2024-04-10 DIAGNOSIS — R809 Proteinuria, unspecified: Secondary | ICD-10-CM | POA: Insufficient documentation

## 2024-04-10 DIAGNOSIS — I10 Essential (primary) hypertension: Secondary | ICD-10-CM

## 2024-04-10 DIAGNOSIS — E1121 Type 2 diabetes mellitus with diabetic nephropathy: Secondary | ICD-10-CM | POA: Insufficient documentation

## 2024-04-10 DIAGNOSIS — Z0000 Encounter for general adult medical examination without abnormal findings: Secondary | ICD-10-CM | POA: Diagnosis present

## 2024-04-10 LAB — PROTEIN/CREAT URINE RANDOM
CREATININE RANDOM URINE: 66 mg/dL (ref 28–217)
PROT/CREAT RATIO URINE RANDOM: 0.96 mg/mg{creat} — ABNORMAL HIGH (ref 0.00–0.15)
TOTAL PROTEIN RANDOM URINE: 64 mg/dL — ABNORMAL HIGH (ref 0–15)

## 2024-04-10 NOTE — Progress Notes (Signed)
 Nephrology Clinic Note      Main problem(s): Proteinuria    HPI: Samantha Cameron is a 68 year old woman with past medical history notable for type 2 diabetes, hyperlipidemia, morbid obesity, hypertension, and proteinuria.     Most recent nephrology appointment: 01/17/2024 for which spironolactone  was started    Interim:   01/24/2024, had an endoscopy, chronic gastritis with flattened mucosa in the duodenum, suspicious for celiac disease but the pathology was unrevealing.    Today the patient denies major complaints.  Reported she has been tolerating spironolactone  well, however, she noticed that she has been making more urine output after she started the medication.  Denies lower extremity swelling.  Denies lightheadedness/dizziness.  She has had dyspnea on exertion and has been able to tolerate only a block of walking.  She has been gaining weight.    Review of Systems:   As per HPI. All other systems reviewed are negative.    Home Medication:  Patient's Medications   New Prescriptions    No medications on file   Previous Medications    ALBUTEROL  HFA 108 (90 BASE) MCG/ACT INHALER    Inhale 2 puffs into the lungs every 6 (six) hours as needed for Wheezing    ATORVASTATIN  (LIPITOR) 40 MG TABLET    Take 1 tablet by mouth at bedtime    B COMPLEX-C-FOLIC ACID (B-COMPLEX/VITAMIN C ) TABS    Take 1 each by mouth in the morning.    CYCLOBENZAPRINE  (FLEXERIL ) 5 MG TABLET    Take 5 mg by mouth 3 (three) times daily as needed    D3-1000 25 MCG (1000 UT) TABLET    Take 1 tablet by mouth in the morning.    FAMOTIDINE  (PEPCID ) 20 MG TABLET    Take 1 tablet by mouth in the morning and 1 tablet before bedtime.    GLUCOSE BLOOD TEST STRIP    Use once daily as directed. Dispense test strips covered by insurance - using freestyle lite. Dx e11.29    LANCETS    Use 1 time daily.  Dispense lancets that are covered by insurance. ICD Code e11.29    METFORMIN  (GLUCOPHAGE -XR) 500 MG 24 HR TABLET    Take 2 tablets by mouth daily with breakfast     METOPROLOL  (TOPROL -XL) 25 MG 24 HR TABLET    Take 1 tablet by mouth daily    OMEPRAZOLE  (PRILOSEC ) 20 MG CAPSULE    Take 1 capsule by mouth daily    OXYCODONE -ACETAMINOPHEN  (PERCOCET ) 5-325 MG PER TABLET    Take 1 tablet by mouth 2 (two) times daily as needed With Dr Hubert Madden    SPIRONOLACTONE  (ALDACTONE ) 25 MG TABLET    Take 1 tablet by mouth daily    SUMATRIPTAN  (IMITREX ) 50 MG TABLET    Take 1 tablet by mouth as needed for Migraine (can take 2nd dose in 2 hours if not resolved, no more than 2 tabs in 24 hours)   Modified Medications    No medications on file   Discontinued Medications    No medications on file       Physical Exam:   Vital Signs: BP 137/81 (Site: LA, Position: Sitting, Cuff Size: Lrg)   Pulse 120   Temp 97.1 F (36.2 C) (Temporal)   Wt 103.4 kg (228 lb)   LMP 10/22/2007 (LMP Unknown)   SpO2 95%   BMI 41.70 kg/m     The repeat manual BP reading was 105/60 mmHg.    GENERAL: Normal appearance, well-developed,  obese  LUNGS: Clear to auscultation  CARDIAC: Regular S1, S2  ABDOMEN: Soft, non tender, BS+  EXTREMITIES: No edema  NEUROLOGIC: Grossly intact, no focal neurodeficits    Recent labs: Reviewed  Lab Results   Component Value Date    NA 140 02/04/2024    K 4.4 02/04/2024    CL 103 02/04/2024    CO2 22 02/04/2024    BUN 15 02/04/2024    CREAT 0.7 02/04/2024    GFR > 60 02/04/2024    GLUCOSER 122 02/04/2024    CA 9.8 02/04/2024    PHOS 4.1 05/28/2016    ALBUMIN 4.6 08/05/2023       Lab Results   Component Value Date    WBC 11.2 (H) 08/05/2023    HGB 14.4 08/05/2023    HCT 45.6 (H) 08/05/2023    PLTA 284 08/05/2023       Urine Analysis: Reviewed  No results for input(s): "UACOL", "UACLA", "UAGLU", "UABIL", "UAKET", "SPEGRAVURINE", "UAOCC", "UAPH", "UAPRO", "UANIT", "LEUKOCYTES", "ALBCREATR" in the last 72 hours.    Imaging: Reviewed  No imaging results were found within the past 3 days.    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  Diabetic nephropathy is also a possibility. To continue conservative care.  - Avoid NSAIDs  - The patient is allergic to lisinopril , angioedema.    - The patient is encouraged to lose weight  - Continue spironolactone , escalate as tolerated  - Consider one of the SGLT2 inhibitors if remains proteinuric  - To check UPCR today    #Follow-up: 4 months    This note was prepared using voice recognition software. Please disregard any transcription errors.     I spent > 30 minutes in both face to face and non-face to face activities with the patient on the day of the encounter. This included time spent in counseling and/or coordination of care regarding above issues and diagnosis, preparation to see the patient (review of tests and imaging), obtaining/reviewing separately obtained history, ordering medications, tests, procedures, referring and communicating with other health professionals, documenting clinical information in the electronic or health records, independently interpreting results, communicating results to the patient/family/caregiver and coordinating care.     Horace Lye, MD  Division of Nephrology    [facility]  [address]  [place], [place] [address]  [phone]  04/10/2024        Answering Service: [phone]

## 2024-04-15 ENCOUNTER — Ambulatory Visit (HOSPITAL_BASED_OUTPATIENT_CLINIC_OR_DEPARTMENT_OTHER): Payer: Self-pay

## 2024-04-15 DIAGNOSIS — R801 Persistent proteinuria, unspecified: Secondary | ICD-10-CM

## 2024-04-15 MED ORDER — EMPAGLIFLOZIN 10 MG PO TABS
10.0000 mg | ORAL_TABLET | Freq: Every day | ORAL | 11 refills | Status: AC
Start: 2024-04-15 — End: 2025-04-15

## 2024-04-15 NOTE — Telephone Encounter (Signed)
-----   Message from Nissaisorakarn, Voravech sent at 04/15/2024  1:30 PM EDT -----  Dear RN,    Please:    1. Create a Telephone encounter for this patient.  2. Share with the patient the attached results.  The urine protein remains highly elevated.  Please start Jardiance 10 mg daily.  Please note that this medication may increase urine production and the risk of UTI.  Please stay hydrated.  Please have a follow-up in 2 weeks after you start the medication.    Plan:  1. Further tests are needed, future labs will be ordered and patient needs to return to have them done in 2 week(s).  A prescription for Evone Hoh will be written. .    2. Type of Outreach: 3 phone calls and if unable to reach send letter    3. Document the conversation in the Telephone Encounter and send the encounter back to me to complete orders and any further necessary documentation.     Thank you,  Nissaisorakarn, Voravech, MD    ----- Message -----  From: Interface, Lab  Sent: 04/10/2024   7:08 PM EDT  To: Voravech Nissaisorakarn, MD

## 2024-04-15 NOTE — Telephone Encounter (Signed)
 Called  patient  message  reviewed.  Name and  DOB  verified.  Marlinda Mike  RN

## 2024-04-15 NOTE — Progress Notes (Signed)
 Dear RN,    Please:    1. Create a Telephone encounter for this patient.  2. Share with the patient the attached results.  The urine protein remains highly elevated.  Please start Jardiance 10 mg daily.  Please note that this medication may increase urine production and the risk of UTI.  Please stay hydrated.  Please have a follow-up in 2 weeks after you start the medication.    Plan:  1. Further tests are needed, future labs will be ordered and patient needs to return to have them done in 2 week(s).  A prescription for Evone Hoh will be written. .    2. Type of Outreach: 3 phone calls and if unable to reach send letter    3. Document the conversation in the Telephone Encounter and send the encounter back to me to complete orders and any further necessary documentation.     Thank you,  Riya Huxford, MD

## 2024-04-23 DIAGNOSIS — M5417 Radiculopathy, lumbosacral region: Secondary | ICD-10-CM | POA: Diagnosis not present

## 2024-04-23 DIAGNOSIS — M4727 Other spondylosis with radiculopathy, lumbosacral region: Secondary | ICD-10-CM | POA: Diagnosis not present

## 2024-04-23 DIAGNOSIS — M5137 Other intervertebral disc degeneration, lumbosacral region with discogenic back pain only: Secondary | ICD-10-CM | POA: Diagnosis not present

## 2024-04-27 ENCOUNTER — Other Ambulatory Visit (HOSPITAL_BASED_OUTPATIENT_CLINIC_OR_DEPARTMENT_OTHER): Payer: Self-pay

## 2024-04-27 NOTE — Progress Notes (Signed)
 CCM Screening:    Pt referred to CCM by High Risk List, reason for referral: High Risk List, ED/inpatient utilization, COPD, and Diabetes    Chart review done.    ACO: [facility]  Primary Insurance: Medicare and [employer]    CCM enrolled in the past year? Yes    Pt is a 68 year old female with  Controlled type 2 DM, atherosclerosis of aorta, diastolic dysfunction with chronic heart failure, morbid obesity and multiple comorbidities        Assessment:    Patient Meets criteria for CCM. CM Assessment started: Yes Patient assigned to Lyndol Santee to begin outreach. Date added to Care Team:04/27/2024  Routine      Additional Recommendations or Comments:week of 05/04/2024

## 2024-05-06 ENCOUNTER — Telehealth (HOSPITAL_BASED_OUTPATIENT_CLINIC_OR_DEPARTMENT_OTHER): Payer: Self-pay

## 2024-05-07 DIAGNOSIS — M5417 Radiculopathy, lumbosacral region: Secondary | ICD-10-CM | POA: Diagnosis not present

## 2024-05-08 ENCOUNTER — Other Ambulatory Visit (HOSPITAL_BASED_OUTPATIENT_CLINIC_OR_DEPARTMENT_OTHER): Payer: Self-pay | Admitting: Internal Medicine

## 2024-05-08 NOTE — Telephone Encounter (Signed)
 PER Pharmacy, Samantha Cameron is a 68 year old female has requested a refill of aspirin .      Last Office Visit: 12/31/2023 [name]     Last Physical Exam: 08/15/2015    There are no preventive care reminders to display for this patient.    Other Med Adult:  Most Recent BP Reading(s)  04/10/24 : 137/81        Cholesterol (mg/dL)   Date Value   97/92/7974 168     LOW DENSITY LIPOPROTEIN DIRECT (mg/dL)   Date Value   97/92/7974 80     HIGH DENSITY LIPOPROTEIN (mg/dL)   Date Value   97/92/7974 46     TRIGLYCERIDES (mg/dL)   Date Value   88/90/7976 268 (H)         THYROID  SCREEN TSH REFLEX FT4 (uIU/mL)   Date Value   03/15/2023 1.340         No results found for: TSH    HEMOGLOBIN A1C (%)   Date Value   12/27/2023 6.6 (H)       No results found for: POCA1C      INR (no units)   Date Value   07/21/2023 1.1   04/18/2017 1.0   05/27/2016 1.0       SODIUM (mmol/L)   Date Value   02/04/2024 140       POTASSIUM (mmol/L)   Date Value   02/04/2024 4.4           CREATININE (mg/dL)   Date Value   96/81/7974 0.7       Documented patient preferred pharmacies:    [facility] - [place], [place]  Phone: [phone] Fax: [phone]

## 2024-05-11 ENCOUNTER — Encounter (HOSPITAL_BASED_OUTPATIENT_CLINIC_OR_DEPARTMENT_OTHER): Payer: Self-pay

## 2024-05-11 ENCOUNTER — Ambulatory Visit

## 2024-05-11 ENCOUNTER — Other Ambulatory Visit: Payer: Self-pay

## 2024-05-11 DIAGNOSIS — R801 Persistent proteinuria, unspecified: Secondary | ICD-10-CM | POA: Diagnosis present

## 2024-05-11 DIAGNOSIS — R748 Abnormal levels of other serum enzymes: Secondary | ICD-10-CM | POA: Insufficient documentation

## 2024-05-11 LAB — BASIC METABOLIC PANEL
ANION GAP: 18 mmol/L (ref 10–22)
BUN (UREA NITROGEN): 14 mg/dL (ref 7–18)
CALCIUM: 9.9 mg/dL (ref 8.5–10.5)
CARBON DIOXIDE: 18 mmol/L — ABNORMAL LOW (ref 21–32)
CHLORIDE: 101 mmol/L (ref 98–107)
CREATININE: 0.8 mg/dL (ref 0.4–1.2)
ESTIMATED GLOMERULAR FILT RATE: >60 mL/min (ref >60)
Glucose Random: 103 mg/dL (ref 74–160)
POTASSIUM: 4.6 mmol/L (ref 3.5–5.1)
SODIUM: 137 mmol/L (ref 136–145)

## 2024-05-11 LAB — LIPASE: LIPASE: 77 U/L — ABNORMAL HIGH (ref 13–60)

## 2024-05-11 NOTE — Progress Notes (Signed)
 Mystic Labo, 05/11/2024  1sst

## 2024-05-18 ENCOUNTER — Telehealth (HOSPITAL_BASED_OUTPATIENT_CLINIC_OR_DEPARTMENT_OTHER): Payer: Self-pay

## 2024-05-18 NOTE — Progress Notes (Signed)
 CCM End or Continue Outreach  Patient meets criteria for CCM.   Outreach attempted three or more times per CCM protocol by Phone and Letter  Additional outreach/enrollment plan: Discontinue outreach, patient refused.  Date removed from Care Team: 05/18/2024

## 2024-05-21 DIAGNOSIS — M5137 Other intervertebral disc degeneration, lumbosacral region with discogenic back pain only: Secondary | ICD-10-CM | POA: Diagnosis not present

## 2024-05-21 DIAGNOSIS — M5417 Radiculopathy, lumbosacral region: Secondary | ICD-10-CM | POA: Diagnosis not present

## 2024-05-21 DIAGNOSIS — M4727 Other spondylosis with radiculopathy, lumbosacral region: Secondary | ICD-10-CM | POA: Diagnosis not present

## 2024-05-26 ENCOUNTER — Ambulatory Visit (HOSPITAL_BASED_OUTPATIENT_CLINIC_OR_DEPARTMENT_OTHER): Payer: Self-pay

## 2024-05-26 NOTE — Progress Notes (Signed)
 Dear RN,    Please:    1. Create a Telephone encounter for this patient.  2. Share with the patient the attached results.  Stable kidney function but your blood is a bit acidic.  We monitor.  Please continue Jardiance .    Plan:  1. No further tests need to be done. .    2. Type of Outreach: 3 phone calls and if unable to reach send letter    3. Document the conversation in the Telephone Encounter and close the encounter, no need to send back to me.     Thank you,  Keyandra Swenson, MD

## 2024-05-26 NOTE — Telephone Encounter (Addendum)
 Called pt- made her aware of results. Advised pt to continue with Jardiance .    Pt denies any questions.         ----- Message from Nissaisorakarn, Voravech sent at 05/26/2024 11:57 AM EDT -----  Dear RN,    Please:    1. Create a Telephone encounter for this patient.  2. Share with the patient the attached results.  Stable kidney function but your blood is a bit acidic.  We monitor.  Please continue Jardiance .    Plan:  1. No further tests need to be done. .    2. Type of Outreach: 3 phone calls and if unable to reach send letter    3. Document the conversation in the Telephone Encounter and close the encounter, no need to send back to me.     Thank you,  Nissaisorakarn, Voravech, MD    ----- Message -----  From: Interface, Lab  Sent: 05/11/2024   7:02 PM EDT  To: Voravech Nissaisorakarn, MD

## 2024-06-19 DIAGNOSIS — M48061 Spinal stenosis, lumbar region without neurogenic claudication: Secondary | ICD-10-CM | POA: Diagnosis not present

## 2024-06-19 DIAGNOSIS — Z79891 Long term (current) use of opiate analgesic: Secondary | ICD-10-CM | POA: Diagnosis not present

## 2024-06-19 DIAGNOSIS — M5417 Radiculopathy, lumbosacral region: Secondary | ICD-10-CM | POA: Diagnosis not present

## 2024-06-19 DIAGNOSIS — M5137 Other intervertebral disc degeneration, lumbosacral region with discogenic back pain only: Secondary | ICD-10-CM | POA: Diagnosis not present

## 2024-06-19 DIAGNOSIS — M4727 Other spondylosis with radiculopathy, lumbosacral region: Secondary | ICD-10-CM | POA: Diagnosis not present

## 2024-06-29 ENCOUNTER — Encounter (HOSPITAL_BASED_OUTPATIENT_CLINIC_OR_DEPARTMENT_OTHER): Payer: Self-pay | Admitting: Internal Medicine

## 2024-06-29 ENCOUNTER — Other Ambulatory Visit: Payer: Self-pay

## 2024-06-29 ENCOUNTER — Ambulatory Visit: Payer: Medicare Other | Attending: Internal Medicine | Admitting: Internal Medicine

## 2024-06-29 VITALS — BP 105/73 | HR 102 | Temp 97.9°F | Wt 225.0 lb

## 2024-06-29 DIAGNOSIS — Z6841 Body Mass Index (BMI) 40.0 and over, adult: Secondary | ICD-10-CM

## 2024-06-29 DIAGNOSIS — I5032 Chronic diastolic (congestive) heart failure: Secondary | ICD-10-CM | POA: Diagnosis not present

## 2024-06-29 DIAGNOSIS — E1129 Type 2 diabetes mellitus with other diabetic kidney complication: Secondary | ICD-10-CM | POA: Insufficient documentation

## 2024-06-29 DIAGNOSIS — E785 Hyperlipidemia, unspecified: Secondary | ICD-10-CM

## 2024-06-29 DIAGNOSIS — R000 Tachycardia, unspecified: Secondary | ICD-10-CM | POA: Diagnosis present

## 2024-06-29 DIAGNOSIS — R202 Paresthesia of skin: Secondary | ICD-10-CM | POA: Diagnosis present

## 2024-06-29 DIAGNOSIS — R748 Abnormal levels of other serum enzymes: Secondary | ICD-10-CM | POA: Diagnosis present

## 2024-06-29 DIAGNOSIS — G43009 Migraine without aura, not intractable, without status migrainosus: Secondary | ICD-10-CM

## 2024-06-29 DIAGNOSIS — R809 Proteinuria, unspecified: Secondary | ICD-10-CM

## 2024-06-29 DIAGNOSIS — G4733 Obstructive sleep apnea (adult) (pediatric): Secondary | ICD-10-CM | POA: Diagnosis present

## 2024-06-29 DIAGNOSIS — F17200 Nicotine dependence, unspecified, uncomplicated: Secondary | ICD-10-CM | POA: Diagnosis present

## 2024-06-29 DIAGNOSIS — J4489 Other specified chronic obstructive pulmonary disease: Secondary | ICD-10-CM

## 2024-06-29 LAB — COMPREHENSIVE METABOLIC PANEL
ALANINE AMINOTRANSFERASE: 17 U/L (ref 12–45)
ALBUMIN: 4.4 g/dL (ref 3.4–5.2)
ALKALINE PHOSPHATASE: 106 U/L (ref 45–117)
ANION GAP: 13 mmol/L (ref 10–22)
ASPARTATE AMINOTRANSFERASE: 18 U/L (ref 8–34)
BILIRUBIN TOTAL: 0.3 mg/dL (ref 0.2–1.0)
BUN (UREA NITROGEN): 11 mg/dL (ref 7–18)
CALCIUM: 10.2 mg/dL (ref 8.5–10.5)
CARBON DIOXIDE: 22 mmol/L (ref 21–32)
CHLORIDE: 104 mmol/L (ref 98–107)
CREATININE: 0.8 mg/dL (ref 0.4–1.2)
ESTIMATED GLOMERULAR FILT RATE: >60 mL/min (ref >60)
Glucose Random: 117 mg/dL (ref 74–160)
POTASSIUM: 4.5 mmol/L (ref 3.5–5.1)
SODIUM: 138 mmol/L (ref 136–145)
TOTAL PROTEIN: 7.1 g/dL (ref 6.4–8.2)

## 2024-06-29 LAB — LIPASE: LIPASE: 58 U/L (ref 13–60)

## 2024-06-29 LAB — HEMOGLOBIN A1C
ESTIMATED AVERAGE GLUCOSE: 146 mg/dL (ref 74–160)
HEMOGLOBIN A1C: 6.7 % — ABNORMAL HIGH (ref 4.0–5.6)

## 2024-06-29 MED ORDER — SUMATRIPTAN SUCCINATE 50 MG PO TABS
50.0000 mg | ORAL_TABLET | ORAL | 11 refills | Status: AC | PRN
Start: 2024-06-29 — End: 2025-06-29

## 2024-06-29 MED ORDER — ATORVASTATIN CALCIUM 40 MG PO TABS
40.0000 mg | ORAL_TABLET | Freq: Every evening | ORAL | 3 refills | Status: AC
Start: 2024-06-29 — End: 2025-06-30

## 2024-06-30 ENCOUNTER — Ambulatory Visit (HOSPITAL_BASED_OUTPATIENT_CLINIC_OR_DEPARTMENT_OTHER): Payer: Self-pay | Admitting: Student in an Organized Health Care Education/Training Program

## 2024-07-06 ENCOUNTER — Telehealth (HOSPITAL_BASED_OUTPATIENT_CLINIC_OR_DEPARTMENT_OTHER): Payer: Self-pay | Admitting: Student in an Organized Health Care Education/Training Program

## 2024-07-06 ENCOUNTER — Encounter (HOSPITAL_BASED_OUTPATIENT_CLINIC_OR_DEPARTMENT_OTHER): Payer: Self-pay | Admitting: Student in an Organized Health Care Education/Training Program

## 2024-07-06 NOTE — Telephone Encounter (Signed)
 Opened in error--please disregard  07/06/2024  Nat LILLETTE Schmitz, PA-C

## 2024-07-16 DIAGNOSIS — M48062 Spinal stenosis, lumbar region with neurogenic claudication: Secondary | ICD-10-CM | POA: Diagnosis not present

## 2024-07-16 DIAGNOSIS — Z96659 Presence of unspecified artificial knee joint: Secondary | ICD-10-CM | POA: Diagnosis not present

## 2024-07-16 DIAGNOSIS — M7062 Trochanteric bursitis, left hip: Secondary | ICD-10-CM | POA: Diagnosis not present

## 2024-07-16 DIAGNOSIS — M4727 Other spondylosis with radiculopathy, lumbosacral region: Secondary | ICD-10-CM | POA: Diagnosis not present

## 2024-07-16 DIAGNOSIS — M5417 Radiculopathy, lumbosacral region: Secondary | ICD-10-CM | POA: Diagnosis not present

## 2024-07-16 DIAGNOSIS — M7061 Trochanteric bursitis, right hip: Secondary | ICD-10-CM | POA: Diagnosis not present

## 2024-07-16 DIAGNOSIS — M5137 Other intervertebral disc degeneration, lumbosacral region with discogenic back pain only: Secondary | ICD-10-CM | POA: Diagnosis not present

## 2024-08-04 ENCOUNTER — Telehealth (HOSPITAL_BASED_OUTPATIENT_CLINIC_OR_DEPARTMENT_OTHER): Payer: Self-pay | Admitting: Student in an Organized Health Care Education/Training Program

## 2024-08-04 NOTE — Telephone Encounter (Signed)
 Received DME request for oxygen  (scanned media)  Patient did not read [facility] message   Will have nursing outreach:    Erskin Ip,      Dr. Creston and I required a durable medical equipment request for home oxygen  and supplies. Sometimes we get sent refills requests by mistake. Are you using home oxygen  at this time?      -Nat

## 2024-08-04 NOTE — Telephone Encounter (Signed)
 Called and reached patient, she reports she is using home oxygen , reports she is on 1 liter continuously. Not clear who ordered this for her but she has had it since her sleep study, uses it during the day as well as with her CPAP machine.

## 2024-08-05 DIAGNOSIS — M47817 Spondylosis without myelopathy or radiculopathy, lumbosacral region: Secondary | ICD-10-CM | POA: Diagnosis not present

## 2024-08-07 NOTE — Telephone Encounter (Signed)
 Informed pt of [employer] message  She verbalized understanding.

## 2024-08-07 NOTE — Telephone Encounter (Signed)
 Hi RN team,     It looks like this order was originally from her pulmonary/sleep team, but we will see if her insurance will accept the renewal from primary care.     Please have the patient notify the pulmonary team if she has any issues receiving her oxygen .     Samantha Cameron

## 2024-08-13 DIAGNOSIS — M5137 Other intervertebral disc degeneration, lumbosacral region with discogenic back pain only: Secondary | ICD-10-CM | POA: Diagnosis not present

## 2024-08-13 DIAGNOSIS — M4727 Other spondylosis with radiculopathy, lumbosacral region: Secondary | ICD-10-CM | POA: Diagnosis not present

## 2024-08-13 DIAGNOSIS — M5417 Radiculopathy, lumbosacral region: Secondary | ICD-10-CM | POA: Diagnosis not present

## 2024-08-13 DIAGNOSIS — M48061 Spinal stenosis, lumbar region without neurogenic claudication: Secondary | ICD-10-CM | POA: Diagnosis not present

## 2024-08-18 NOTE — Progress Notes (Signed)
 Nephrology Clinic Note      Main problem(s): Proteinuria    HPI: Samantha Cameron is a 68 year old woman with past medical history notable for type 2 diabetes, hyperlipidemia, morbid obesity, hypertension, and proteinuria.     Most recent nephrology appointment: 04/10/2024 for which Jardiance  was started.    Interim: Uneventful    Today the patient denies major complaints.  She has been tolerating Jardiance  and spironolactone  well.  Denies UTI/trouble urination.  No edema.  She has been losing weight.  Denies lightheadedness/dizziness.    Review of Systems:   As per HPI. All other systems reviewed are negative.    Home Medication:  Patient's Medications   New Prescriptions    No medications on file   Previous Medications    ALBUTEROL  HFA 108 (90 BASE) MCG/ACT INHALER    Inhale 2 puffs into the lungs every 6 (six) hours as needed for Wheezing    ASPIRIN  LOW DOSE 81 MG EC TABLET    Take 1 tablet by mouth in the morning.    ATORVASTATIN  (LIPITOR) 40 MG TABLET    Take 1 tablet by mouth at bedtime    B COMPLEX-C-FOLIC ACID (B-COMPLEX/VITAMIN C ) TABS    Take 1 each by mouth in the morning.    CYCLOBENZAPRINE  (FLEXERIL ) 5 MG TABLET    Take 5 mg by mouth 3 (three) times daily as needed    D3-1000 25 MCG (1000 UT) TABLET    Take 1 tablet by mouth in the morning.    EMPAGLIFLOZIN  10 MG TABS    Take 10 mg by mouth daily    FAMOTIDINE  (PEPCID ) 20 MG TABLET    Take 1 tablet by mouth in the morning and 1 tablet before bedtime.    GLUCOSE BLOOD TEST STRIP    Use once daily as directed. Dispense test strips covered by insurance - using freestyle lite. Dx e11.29    LANCETS    Use 1 time daily.  Dispense lancets that are covered by insurance. ICD Code e11.29    METFORMIN  (GLUCOPHAGE -XR) 500 MG 24 HR TABLET    Take 2 tablets by mouth daily with breakfast    METOPROLOL  (TOPROL -XL) 25 MG 24 HR TABLET    Take 1 tablet by mouth daily    OMEPRAZOLE  (PRILOSEC ) 20 MG CAPSULE    Take 1 capsule by mouth daily    OXYCODONE -ACETAMINOPHEN   (PERCOCET ) 5-325 MG PER TABLET    Take 1 tablet by mouth 2 (two) times daily as needed With Dr Von    SPIRONOLACTONE  (ALDACTONE ) 25 MG TABLET    Take 1 tablet by mouth daily    SUMATRIPTAN  (IMITREX ) 50 MG TABLET    Take 1 tablet by mouth as needed for Migraine (can take 2nd dose in 2 hours if not resolved, no more than 2 tabs in 24 hours)   Modified Medications    No medications on file   Discontinued Medications    No medications on file       Physical Exam:   Vital Signs: BP 91/63 (Site: LA, Position: Sitting, Cuff Size: Lrg)   Pulse 115   Wt 97.5 kg (215 lb)   LMP 10/22/2007 (LMP Unknown)   SpO2 98%   BMI 39.32 kg/m     GENERAL: Normal appearance, well-developed, obese  LUNGS: Clear to auscultation  CARDIAC: Regular S1, S2  ABDOMEN: Soft, non tender, BS+  EXTREMITIES: No edema  NEUROLOGIC: Grossly intact, no focal neurodeficits    Recent labs: Reviewed  Lab Results   Component Value Date    NA 138 06/29/2024    K 4.5 06/29/2024    CL 104 06/29/2024    CO2 22 06/29/2024    BUN 11 06/29/2024    CREAT 0.8 06/29/2024    GFR > 60 06/29/2024    GLUCOSER 117 06/29/2024    CA 10.2 06/29/2024    PHOS 4.1 05/28/2016    ALBUMIN 4.4 06/29/2024       Lab Results   Component Value Date    WBC 11.2 (H) 08/05/2023    HGB 14.4 08/05/2023    HCT 45.6 (H) 08/05/2023    PLTA 284 08/05/2023       Urine Analysis: Reviewed  No results for input(s): UACOL, UACLA, UAGLU, UABIL, UAKET, SPEGRAVURINE, UAOCC, UAPH, UAPRO, UANIT, LEUKOCYTES, ALBCREATR in the last 72 hours.    Imaging: Reviewed  No imaging results were found within the past 3 days.    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  Diabetic nephropathy is also a possibility. To continue conservative care.  - Avoid NSAIDs  - The patient is allergic to lisinopril ,  angioedema.    - The patient is encouraged to lose weight  - Continue spironolactone  and Jardiance , escalate as tolerated  - To check UPCR today    #Follow-up: 5 months    This note was prepared using voice recognition software. Please disregard any transcription errors.     I spent >  minutes in both face to face and non-face to face activities with the patient on the day of the encounter. This included time spent in counseling and/or coordination of care regarding above issues and diagnosis, preparation to see the patient (review of tests and imaging), obtaining/reviewing separately obtained history, ordering medications, tests, procedures, referring and communicating with other health professionals, documenting clinical information in the electronic or health records, independently interpreting results, communicating results to the patient/family/caregiver and coordinating care.     Audrie Addison, MD  Division of Nephrology    [facility]  [address]  [place], [place] [address]  [phone]  08/19/2024        Answering Service: [phone]

## 2024-08-19 ENCOUNTER — Other Ambulatory Visit: Payer: Self-pay

## 2024-08-19 ENCOUNTER — Ambulatory Visit: Admission: RE | Admit: 2024-08-19 | Discharge: 2024-08-19 | Disposition: A | Discharge status: Home / Self Care

## 2024-08-19 ENCOUNTER — Ambulatory Visit (HOSPITAL_BASED_OUTPATIENT_CLINIC_OR_DEPARTMENT_OTHER)

## 2024-08-19 VITALS — BP 91/63 | HR 115 | Wt 215.0 lb

## 2024-08-19 DIAGNOSIS — E1121 Type 2 diabetes mellitus with diabetic nephropathy: Secondary | ICD-10-CM | POA: Insufficient documentation

## 2024-08-19 DIAGNOSIS — E669 Obesity, unspecified: Secondary | ICD-10-CM | POA: Diagnosis not present

## 2024-08-19 DIAGNOSIS — Z6841 Body Mass Index (BMI) 40.0 and over, adult: Secondary | ICD-10-CM | POA: Diagnosis not present

## 2024-08-19 DIAGNOSIS — E1129 Type 2 diabetes mellitus with other diabetic kidney complication: Secondary | ICD-10-CM | POA: Insufficient documentation

## 2024-08-19 DIAGNOSIS — R809 Proteinuria, unspecified: Secondary | ICD-10-CM | POA: Diagnosis present

## 2024-08-19 DIAGNOSIS — N051 Unspecified nephritic syndrome with focal and segmental glomerular lesions: Secondary | ICD-10-CM | POA: Insufficient documentation

## 2024-08-19 DIAGNOSIS — R801 Persistent proteinuria, unspecified: Secondary | ICD-10-CM

## 2024-08-19 LAB — PROTEIN/CREAT URINE RANDOM
CREATININE RANDOM URINE: 98 mg/dL (ref 28–217)
PROT/CREAT RATIO URINE RANDOM: 0.92 mg/mg{creat} — ABNORMAL HIGH (ref 0.00–0.15)
TOTAL PROTEIN RANDOM URINE: 90 mg/dL — ABNORMAL HIGH (ref 0–15)
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
## 2024-08-20 ENCOUNTER — Ambulatory Visit (HOSPITAL_BASED_OUTPATIENT_CLINIC_OR_DEPARTMENT_OTHER): Payer: Self-pay

## 2024-08-20 NOTE — Telephone Encounter (Signed)
 I called patient with results  Name and DOB verified  I gave her results she was questions why urine protein is still high  Advised to stay on same medications  She states her BP has been all over the place  I will let r Nissaisorakarn know

## 2024-08-20 NOTE — Progress Notes (Signed)
 Dear RN,    Please:    1. Create a Telephone encounter for this patient.  2. Share with the patient the attached results. The urine protein remains elevated. Given that your blood pressure was low, I would not intensify the dose of Jardiance  or spironolactone . We will monitor.     Plan:  1. No further tests need to be done. .    2. Type of Outreach: 2 phone calls and if unable to reach send letter    3. Document the conversation in the Telephone Encounter and close the encounter, no need to send back to me.     Thank you,  Janan Bogie, MD

## 2024-08-20 NOTE — Telephone Encounter (Signed)
-----   Message from Nissaisorakarn, Voravech sent at 08/20/2024  8:37 AM EDT -----  Dear RN,    Please:    1. Create a Telephone encounter for this patient.  2. Share with the patient the attached results. The urine protein remains elevated. Given that your blood pressure was low, I would not intensify the dose of Jardiance  or spironolactone . We will monitor.     Plan:  1. No further tests need to be done. .    2. Type of Outreach: 2 phone calls and if unable to reach send letter    3. Document the conversation in the Telephone Encounter and close the encounter, no need to send back to me.     Thank you,  Nissaisorakarn, Voravech, MD    ----- Message -----  From: Interface, Lab  Sent: 08/19/2024  10:21 PM EDT  To: Voravech Nissaisorakarn, MD

## 2024-09-10 DIAGNOSIS — M48061 Spinal stenosis, lumbar region without neurogenic claudication: Secondary | ICD-10-CM | POA: Diagnosis not present

## 2024-09-10 DIAGNOSIS — M25552 Pain in left hip: Secondary | ICD-10-CM | POA: Diagnosis not present

## 2024-09-10 DIAGNOSIS — M51379 Other intervertebral disc degeneration, lumbosacral region without mention of lumbar back pain or lower extremity pain: Secondary | ICD-10-CM | POA: Diagnosis not present

## 2024-09-10 DIAGNOSIS — M4727 Other spondylosis with radiculopathy, lumbosacral region: Secondary | ICD-10-CM | POA: Diagnosis not present

## 2024-09-10 DIAGNOSIS — Z79891 Long term (current) use of opiate analgesic: Secondary | ICD-10-CM | POA: Diagnosis not present

## 2024-09-10 DIAGNOSIS — M5417 Radiculopathy, lumbosacral region: Secondary | ICD-10-CM | POA: Diagnosis not present

## 2024-09-28 ENCOUNTER — Telehealth (HOSPITAL_BASED_OUTPATIENT_CLINIC_OR_DEPARTMENT_OTHER): Payer: Self-pay

## 2024-09-28 NOTE — Telephone Encounter (Signed)
 I called the patient and Left a voicemail to return call    If patient calls back, please: Patient requested a utility letter for the electric account number [id number].The doctor can't complete it because the account holders name is different from the name in the chart .Please call [employer] to update it.

## 2024-10-01 ENCOUNTER — Other Ambulatory Visit (HOSPITAL_BASED_OUTPATIENT_CLINIC_OR_DEPARTMENT_OTHER): Payer: Self-pay | Admitting: Internal Medicine

## 2024-10-01 DIAGNOSIS — R1010 Upper abdominal pain, unspecified: Secondary | ICD-10-CM

## 2024-10-01 DIAGNOSIS — K2100 Gastro-esophageal reflux disease with esophagitis, without bleeding: Secondary | ICD-10-CM

## 2024-10-01 DIAGNOSIS — K2950 Unspecified chronic gastritis without bleeding: Secondary | ICD-10-CM

## 2024-10-01 NOTE — Telephone Encounter (Signed)
 PER who is calling: Patient (self), Samantha Cameron is a 68 year old female has requested a refill of       famotidine .                  Last Office Visit  : 09/28/2024 [name]      Last Tele Visit:  02/20/2024      Last Physical Exam:  08/15/2015    There are no preventive care reminders to display for this patient.    Other Med Adult:  Most Recent BP Reading(s)  08/19/24 : 91/63        Cholesterol (mg/dL)   Date Value   97/92/7974 168     LOW DENSITY LIPOPROTEIN DIRECT (mg/dL)   Date Value   97/92/7974 80     HIGH DENSITY LIPOPROTEIN (mg/dL)   Date Value   97/92/7974 46     TRIGLYCERIDES (mg/dL)   Date Value   88/90/7976 268 (H)         THYROID  SCREEN TSH REFLEX FT4 (uIU/mL)   Date Value   03/15/2023 1.340         No results found for: TSH    HEMOGLOBIN A1C (%)   Date Value   06/29/2024 6.7 (H)       No results found for: POCA1C      INR (no units)   Date Value   07/21/2023 1.1   04/18/2017 1.0   05/27/2016 1.0       SODIUM (mmol/L)   Date Value   06/29/2024 138       POTASSIUM (mmol/L)   Date Value   06/29/2024 4.5           CREATININE (mg/dL)   Date Value   91/88/7974 0.8       Documented patient preferred pharmacies:    [facility] - [place], [place]  Phone: [phone] Fax: [phone]

## 2024-10-08 DIAGNOSIS — M5137 Other intervertebral disc degeneration, lumbosacral region with discogenic back pain only: Secondary | ICD-10-CM | POA: Diagnosis not present

## 2024-10-08 DIAGNOSIS — M4727 Other spondylosis with radiculopathy, lumbosacral region: Secondary | ICD-10-CM | POA: Diagnosis not present

## 2024-10-08 DIAGNOSIS — M48061 Spinal stenosis, lumbar region without neurogenic claudication: Secondary | ICD-10-CM | POA: Diagnosis not present

## 2024-10-08 DIAGNOSIS — M5417 Radiculopathy, lumbosacral region: Secondary | ICD-10-CM | POA: Diagnosis not present

## 2024-10-09 DIAGNOSIS — Z23 Encounter for immunization: Secondary | ICD-10-CM | POA: Diagnosis not present

## 2024-12-11 ENCOUNTER — Other Ambulatory Visit (HOSPITAL_BASED_OUTPATIENT_CLINIC_OR_DEPARTMENT_OTHER): Payer: Self-pay | Admitting: Student in an Organized Health Care Education/Training Program

## 2024-12-11 DIAGNOSIS — E1129 Type 2 diabetes mellitus with other diabetic kidney complication: Secondary | ICD-10-CM

## 2024-12-12 NOTE — Telephone Encounter (Signed)
 This prescription refill request has passed the Rx Renewal Authorization protocol.  This medication has been approved and sent to the patient's preferred pharmacy.      PER who is calling: Pharmacy, Samantha Cameron is a 69 year old female has requested a refill of metformin .      Last Office Visit: 06/29/24 with Creston BROCKS   Last Physical Exam: 08/15/2015    A1C due on 12/30/2024  DM EYE EXAM due on 01/15/2025    Other Med Adult:  Most Recent BP Reading(s)  08/19/24 : 91/63        Cholesterol (mg/dL)   Date Value   97/92/7974 168     LOW DENSITY LIPOPROTEIN DIRECT (mg/dL)   Date Value   97/92/7974 80     HIGH DENSITY LIPOPROTEIN (mg/dL)   Date Value   97/92/7974 46     TRIGLYCERIDES (mg/dL)   Date Value   88/90/7976 268 (H)         THYROID  SCREEN TSH REFLEX FT4 (uIU/mL)   Date Value   03/15/2023 1.340         No results found for: TSH    HEMOGLOBIN A1C (%)   Date Value   06/29/2024 6.7 (H)       No results found for: POCA1C      INR (no units)   Date Value   07/21/2023 1.1   04/18/2017 1.0   05/27/2016 1.0       SODIUM (mmol/L)   Date Value   06/29/2024 138       POTASSIUM (mmol/L)   Date Value   06/29/2024 4.5           CREATININE (mg/dL)   Date Value   91/88/7974 0.8       Documented patient preferred pharmacies:    [facility] - [place], [place]  Phone: [phone] Fax: [phone]

## 2024-12-17 ENCOUNTER — Other Ambulatory Visit (HOSPITAL_BASED_OUTPATIENT_CLINIC_OR_DEPARTMENT_OTHER): Payer: Self-pay | Admitting: Internal Medicine

## 2024-12-17 MED ORDER — BLOOD GLUCOSE MONITORING SUPPL KIT: PACK | 0 refills | Status: AC

## 2024-12-17 MED ORDER — LANCETS: 11 refills | Status: AC

## 2024-12-17 MED ORDER — LANCETS: 11 refills | Status: DC

## 2024-12-17 MED ORDER — BLOOD GLUCOSE MONITORING SUPPL KIT: PACK | 0 refills | Status: DC

## 2024-12-17 NOTE — Addendum Note (Signed)
 Addended by: Forrest Jaroszewski A on: 12/17/2024 03:02 PM     Modules accepted: Orders

## 2024-12-17 NOTE — Telephone Encounter (Signed)
 Hello,    Please resend the script to [facility] in [place]     Thank you

## 2024-12-17 NOTE — Telephone Encounter (Signed)
 PER who is calling: Patient (self), Samantha Cameron is a 69 year old female has requested a refill of      Test strip  Lancets          Last Office Visit  : 06/29/2024 Creston Lonni SQUIBB., MD      Last Tele Visit:  Visit date not found      Last Physical Exam:  08/15/2015    A1C due on 12/30/2024  DM EYE EXAM due on 01/15/2025    Other Med Adult:  Most Recent BP Reading(s)  08/19/24 : 91/63        Cholesterol (mg/dL)   Date Value   97/92/7974 168     LOW DENSITY LIPOPROTEIN DIRECT (mg/dL)   Date Value   97/92/7974 80     HIGH DENSITY LIPOPROTEIN (mg/dL)   Date Value   97/92/7974 46     TRIGLYCERIDES (mg/dL)   Date Value   88/90/7976 268 (H)         THYROID  SCREEN TSH REFLEX FT4 (uIU/mL)   Date Value   03/15/2023 1.340         No results found for: TSH    HEMOGLOBIN A1C (%)   Date Value   06/29/2024 6.7 (H)       No results found for: POCA1C      INR (no units)   Date Value   07/21/2023 1.1   04/18/2017 1.0   05/27/2016 1.0       SODIUM (mmol/L)   Date Value   06/29/2024 138       POTASSIUM (mmol/L)   Date Value   06/29/2024 4.5           CREATININE (mg/dL)   Date Value   91/88/7974 0.8       Documented patient preferred pharmacies:    [facility] - [place], [place]  Phone: [phone] Fax: [phone]

## 2024-12-30 ENCOUNTER — Ambulatory Visit: Admitting: Internal Medicine

## 2025-01-21 ENCOUNTER — Ambulatory Visit
